# Patient Record
Sex: Male | Born: 1950 | Hispanic: No | Smoking: Former smoker
Health system: Southern US, Community
[De-identification: ages and names within clinical notes are randomized; demographics above are authoritative.]

## PROBLEM LIST (undated history)

## (undated) DIAGNOSIS — I160 Hypertensive urgency: Secondary | ICD-10-CM

## (undated) DIAGNOSIS — I2699 Other pulmonary embolism without acute cor pulmonale: Secondary | ICD-10-CM

## (undated) DIAGNOSIS — E559 Vitamin D deficiency, unspecified: Secondary | ICD-10-CM

## (undated) DIAGNOSIS — K219 Gastro-esophageal reflux disease without esophagitis: Secondary | ICD-10-CM

## (undated) DIAGNOSIS — N186 End stage renal disease: Secondary | ICD-10-CM

## (undated) DIAGNOSIS — I739 Peripheral vascular disease, unspecified: Secondary | ICD-10-CM

## (undated) DIAGNOSIS — R7989 Other specified abnormal findings of blood chemistry: Secondary | ICD-10-CM

## (undated) DIAGNOSIS — I10 Essential (primary) hypertension: Secondary | ICD-10-CM

## (undated) DIAGNOSIS — N2581 Secondary hyperparathyroidism of renal origin: Secondary | ICD-10-CM

## (undated) DIAGNOSIS — I2510 Atherosclerotic heart disease of native coronary artery without angina pectoris: Secondary | ICD-10-CM

## (undated) DIAGNOSIS — I4891 Unspecified atrial fibrillation: Secondary | ICD-10-CM

## (undated) DIAGNOSIS — R162 Hepatomegaly with splenomegaly, not elsewhere classified: Secondary | ICD-10-CM

## (undated) DIAGNOSIS — I499 Cardiac arrhythmia, unspecified: Secondary | ICD-10-CM

## (undated) DIAGNOSIS — G4730 Sleep apnea, unspecified: Secondary | ICD-10-CM

## (undated) DIAGNOSIS — Z8719 Personal history of other diseases of the digestive system: Secondary | ICD-10-CM

## (undated) DIAGNOSIS — I4892 Unspecified atrial flutter: Secondary | ICD-10-CM

## (undated) DIAGNOSIS — Z992 Dependence on renal dialysis: Secondary | ICD-10-CM

## (undated) DIAGNOSIS — G609 Hereditary and idiopathic neuropathy, unspecified: Secondary | ICD-10-CM

## (undated) DIAGNOSIS — K5790 Diverticulosis of intestine, part unspecified, without perforation or abscess without bleeding: Secondary | ICD-10-CM

## (undated) DIAGNOSIS — Z9889 Other specified postprocedural states: Secondary | ICD-10-CM

## (undated) DIAGNOSIS — I770 Arteriovenous fistula, acquired: Secondary | ICD-10-CM

## (undated) DIAGNOSIS — R55 Syncope and collapse: Secondary | ICD-10-CM

## (undated) DIAGNOSIS — I219 Acute myocardial infarction, unspecified: Secondary | ICD-10-CM

## (undated) DIAGNOSIS — R0602 Shortness of breath: Secondary | ICD-10-CM

## (undated) DIAGNOSIS — K625 Hemorrhage of anus and rectum: Secondary | ICD-10-CM

## (undated) DIAGNOSIS — I509 Heart failure, unspecified: Secondary | ICD-10-CM

## (undated) DIAGNOSIS — D649 Anemia, unspecified: Secondary | ICD-10-CM

## (undated) DIAGNOSIS — Z8739 Personal history of other diseases of the musculoskeletal system and connective tissue: Secondary | ICD-10-CM

## (undated) DIAGNOSIS — I5023 Acute on chronic systolic (congestive) heart failure: Secondary | ICD-10-CM

## (undated) DIAGNOSIS — N289 Disorder of kidney and ureter, unspecified: Secondary | ICD-10-CM

## (undated) HISTORY — DX: Hypertensive urgency: I16.0

## (undated) HISTORY — DX: Anemia, unspecified: D64.9

## (undated) HISTORY — DX: Vitamin D deficiency, unspecified: E55.9

## (undated) HISTORY — DX: Hereditary and idiopathic neuropathy, unspecified: G60.9

## (undated) HISTORY — DX: Other pulmonary embolism without acute cor pulmonale: I26.99

## (undated) HISTORY — PX: AV FISTULA PLACEMENT: SHX1204

## (undated) HISTORY — DX: Unspecified atrial fibrillation: I48.91

## (undated) HISTORY — DX: Diverticulosis of intestine, part unspecified, without perforation or abscess without bleeding: K57.90

## (undated) HISTORY — DX: Arteriovenous fistula, acquired: I77.0

## (undated) HISTORY — DX: Syncope and collapse: R55

## (undated) HISTORY — DX: Secondary hyperparathyroidism of renal origin: N25.81

## (undated) HISTORY — PX: KNEE ARTHROSCOPY: SHX127

---

## 2003-05-19 ENCOUNTER — Emergency Department (HOSPITAL_COMMUNITY): Admission: EM | Admit: 2003-05-19 | Discharge: 2003-05-20 | Payer: Self-pay | Admitting: Emergency Medicine

## 2003-05-20 ENCOUNTER — Encounter: Payer: Self-pay | Admitting: Emergency Medicine

## 2005-07-10 ENCOUNTER — Inpatient Hospital Stay (HOSPITAL_COMMUNITY): Admission: EM | Admit: 2005-07-10 | Discharge: 2005-07-16 | Payer: Self-pay | Admitting: Emergency Medicine

## 2005-07-14 ENCOUNTER — Encounter (INDEPENDENT_AMBULATORY_CARE_PROVIDER_SITE_OTHER): Payer: Self-pay | Admitting: Cardiology

## 2005-09-22 ENCOUNTER — Ambulatory Visit: Payer: Self-pay | Admitting: Oncology

## 2005-10-06 ENCOUNTER — Ambulatory Visit: Payer: Self-pay | Admitting: Gastroenterology

## 2005-10-20 ENCOUNTER — Ambulatory Visit: Payer: Self-pay | Admitting: Gastroenterology

## 2005-10-29 HISTORY — PX: COLONOSCOPY: SHX174

## 2005-11-24 ENCOUNTER — Ambulatory Visit: Payer: Self-pay | Admitting: Oncology

## 2006-01-14 ENCOUNTER — Ambulatory Visit: Payer: Self-pay | Admitting: *Deleted

## 2006-01-22 ENCOUNTER — Ambulatory Visit: Payer: Self-pay | Admitting: Oncology

## 2006-11-30 ENCOUNTER — Ambulatory Visit: Payer: Self-pay | Admitting: Oncology

## 2007-02-11 ENCOUNTER — Ambulatory Visit: Payer: Self-pay | Admitting: Vascular Surgery

## 2007-02-11 ENCOUNTER — Emergency Department (HOSPITAL_COMMUNITY): Admission: EM | Admit: 2007-02-11 | Discharge: 2007-02-11 | Payer: Self-pay | Admitting: Emergency Medicine

## 2007-02-11 ENCOUNTER — Encounter (INDEPENDENT_AMBULATORY_CARE_PROVIDER_SITE_OTHER): Payer: Self-pay | Admitting: Emergency Medicine

## 2007-12-11 ENCOUNTER — Inpatient Hospital Stay (HOSPITAL_COMMUNITY): Admission: EM | Admit: 2007-12-11 | Discharge: 2007-12-15 | Payer: Self-pay | Admitting: Emergency Medicine

## 2007-12-11 ENCOUNTER — Ambulatory Visit: Payer: Self-pay | Admitting: Infectious Diseases

## 2007-12-13 ENCOUNTER — Ambulatory Visit: Payer: Self-pay | Admitting: Vascular Surgery

## 2007-12-13 ENCOUNTER — Encounter: Payer: Self-pay | Admitting: Infectious Diseases

## 2007-12-22 ENCOUNTER — Ambulatory Visit: Payer: Self-pay | Admitting: Internal Medicine

## 2007-12-22 ENCOUNTER — Encounter (INDEPENDENT_AMBULATORY_CARE_PROVIDER_SITE_OTHER): Payer: Self-pay | Admitting: Internal Medicine

## 2007-12-22 DIAGNOSIS — N189 Chronic kidney disease, unspecified: Secondary | ICD-10-CM

## 2007-12-22 DIAGNOSIS — D509 Iron deficiency anemia, unspecified: Secondary | ICD-10-CM | POA: Insufficient documentation

## 2007-12-22 DIAGNOSIS — R809 Proteinuria, unspecified: Secondary | ICD-10-CM | POA: Insufficient documentation

## 2007-12-22 DIAGNOSIS — N2581 Secondary hyperparathyroidism of renal origin: Secondary | ICD-10-CM | POA: Insufficient documentation

## 2007-12-22 DIAGNOSIS — I10 Essential (primary) hypertension: Secondary | ICD-10-CM

## 2007-12-22 DIAGNOSIS — E569 Vitamin deficiency, unspecified: Secondary | ICD-10-CM | POA: Insufficient documentation

## 2007-12-22 LAB — CONVERTED CEMR LAB
Albumin: 2.9 g/dL — ABNORMAL LOW (ref 3.5–5.2)
BUN: 37 mg/dL — ABNORMAL HIGH (ref 6–23)
CO2: 24 meq/L (ref 19–32)
Calcium: 8.9 mg/dL (ref 8.4–10.5)
Chloride: 103 meq/L (ref 96–112)
Creatinine, Ser: 4.03 mg/dL — ABNORMAL HIGH (ref 0.40–1.50)
Glucose, Bld: 85 mg/dL (ref 70–99)
Magnesium: 2.5 mg/dL (ref 1.5–2.5)
Phosphorus: 3.9 mg/dL (ref 2.3–4.6)
Potassium: 3.9 meq/L (ref 3.5–5.3)
Sodium: 135 meq/L (ref 135–145)

## 2008-02-09 ENCOUNTER — Ambulatory Visit: Payer: Self-pay | Admitting: Vascular Surgery

## 2008-02-23 ENCOUNTER — Ambulatory Visit: Payer: Self-pay | Admitting: *Deleted

## 2008-02-23 ENCOUNTER — Encounter: Payer: Self-pay | Admitting: Internal Medicine

## 2008-02-23 DIAGNOSIS — IMO0001 Reserved for inherently not codable concepts without codable children: Secondary | ICD-10-CM | POA: Insufficient documentation

## 2008-02-24 ENCOUNTER — Telehealth (INDEPENDENT_AMBULATORY_CARE_PROVIDER_SITE_OTHER): Payer: Self-pay | Admitting: Internal Medicine

## 2008-02-24 DIAGNOSIS — N179 Acute kidney failure, unspecified: Secondary | ICD-10-CM | POA: Insufficient documentation

## 2008-02-24 LAB — CONVERTED CEMR LAB
Albumin: 4.2 g/dL (ref 3.5–5.2)
BUN: 73 mg/dL — ABNORMAL HIGH (ref 6–23)
CO2: 21 meq/L (ref 19–32)
Calcium: 13.6 mg/dL (ref 8.4–10.5)
Chloride: 96 meq/L (ref 96–112)
Creatinine, Ser: 8.43 mg/dL — ABNORMAL HIGH (ref 0.40–1.50)
Glucose, Bld: 120 mg/dL — ABNORMAL HIGH (ref 70–99)
Phosphorus: 5.9 mg/dL — ABNORMAL HIGH (ref 2.3–4.6)
Potassium: 5.1 meq/L (ref 3.5–5.3)
Sed Rate: 37 mm/hr — ABNORMAL HIGH (ref 0–16)
Sodium: 132 meq/L — ABNORMAL LOW (ref 135–145)
Total CK: 206 units/L (ref 7–232)

## 2008-03-07 ENCOUNTER — Ambulatory Visit: Payer: Self-pay | Admitting: Internal Medicine

## 2008-03-07 ENCOUNTER — Encounter: Payer: Self-pay | Admitting: Internal Medicine

## 2008-03-07 DIAGNOSIS — J309 Allergic rhinitis, unspecified: Secondary | ICD-10-CM | POA: Insufficient documentation

## 2008-03-08 LAB — CONVERTED CEMR LAB
Albumin: 3.9 g/dL (ref 3.5–5.2)
BUN: 63 mg/dL — ABNORMAL HIGH (ref 6–23)
CO2: 17 meq/L — ABNORMAL LOW (ref 19–32)
Calcium: 8.6 mg/dL (ref 8.4–10.5)
Chloride: 106 meq/L (ref 96–112)
Creatinine, Ser: 6.77 mg/dL — ABNORMAL HIGH (ref 0.40–1.50)
Glucose, Bld: 97 mg/dL (ref 70–99)
Phosphorus: 4 mg/dL (ref 2.3–4.6)
Potassium: 3.9 meq/L (ref 3.5–5.3)
Sodium: 137 meq/L (ref 135–145)

## 2008-03-22 ENCOUNTER — Encounter (INDEPENDENT_AMBULATORY_CARE_PROVIDER_SITE_OTHER): Payer: Self-pay | Admitting: Internal Medicine

## 2008-03-22 ENCOUNTER — Ambulatory Visit: Payer: Self-pay | Admitting: Infectious Diseases

## 2008-03-22 LAB — CONVERTED CEMR LAB
Albumin: 4.3 g/dL (ref 3.5–5.2)
BUN: 49 mg/dL — ABNORMAL HIGH (ref 6–23)
CO2: 19 meq/L (ref 19–32)
Calcium: 8.3 mg/dL — ABNORMAL LOW (ref 8.4–10.5)
Chloride: 105 meq/L (ref 96–112)
Creatinine, Ser: 4.52 mg/dL — ABNORMAL HIGH (ref 0.40–1.50)
Glucose, Bld: 74 mg/dL (ref 70–99)
Magnesium: 2 mg/dL (ref 1.5–2.5)
Phosphorus: 3.6 mg/dL (ref 2.3–4.6)
Potassium: 4.2 meq/L (ref 3.5–5.3)
Sodium: 138 meq/L (ref 135–145)

## 2008-03-31 ENCOUNTER — Ambulatory Visit: Payer: Self-pay | Admitting: Vascular Surgery

## 2008-04-20 ENCOUNTER — Telehealth (INDEPENDENT_AMBULATORY_CARE_PROVIDER_SITE_OTHER): Payer: Self-pay | Admitting: Internal Medicine

## 2008-06-07 ENCOUNTER — Ambulatory Visit: Payer: Self-pay | Admitting: Vascular Surgery

## 2008-06-07 ENCOUNTER — Telehealth (INDEPENDENT_AMBULATORY_CARE_PROVIDER_SITE_OTHER): Payer: Self-pay | Admitting: Internal Medicine

## 2008-09-18 ENCOUNTER — Telehealth (INDEPENDENT_AMBULATORY_CARE_PROVIDER_SITE_OTHER): Payer: Self-pay | Admitting: Internal Medicine

## 2008-09-25 ENCOUNTER — Encounter (INDEPENDENT_AMBULATORY_CARE_PROVIDER_SITE_OTHER): Payer: Self-pay | Admitting: Internal Medicine

## 2008-09-25 ENCOUNTER — Encounter: Payer: Self-pay | Admitting: Internal Medicine

## 2008-09-25 ENCOUNTER — Ambulatory Visit: Payer: Self-pay | Admitting: Internal Medicine

## 2008-09-29 LAB — CONVERTED CEMR LAB
ALT: 16 units/L (ref 0–53)
AST: 18 units/L (ref 0–37)
Albumin: 4.2 g/dL (ref 3.5–5.2)
Alkaline Phosphatase: 72 units/L (ref 39–117)
BUN: 61 mg/dL — ABNORMAL HIGH (ref 6–23)
CO2: 18 meq/L — ABNORMAL LOW (ref 19–32)
Calcium: 9.4 mg/dL (ref 8.4–10.5)
Chloride: 105 meq/L (ref 96–112)
Creatinine, Ser: 7.83 mg/dL — ABNORMAL HIGH (ref 0.40–1.50)
Glucose, Bld: 82 mg/dL (ref 70–99)
HCT: 33.7 % — ABNORMAL LOW (ref 39.0–52.0)
Hemoglobin: 11.1 g/dL — ABNORMAL LOW (ref 13.0–17.0)
MCHC: 32.9 g/dL (ref 30.0–36.0)
MCV: 80.8 fL (ref 78.0–100.0)
Platelets: 203 10*3/uL (ref 150–400)
Potassium: 4 meq/L (ref 3.5–5.3)
RBC: 4.17 M/uL — ABNORMAL LOW (ref 4.22–5.81)
RDW: 14.5 % (ref 11.5–15.5)
Sodium: 136 meq/L (ref 135–145)
Total Bilirubin: 0.5 mg/dL (ref 0.3–1.2)
Total Protein: 7.9 g/dL (ref 6.0–8.3)
WBC: 3.2 10*3/uL — ABNORMAL LOW (ref 4.0–10.5)

## 2008-11-08 ENCOUNTER — Ambulatory Visit: Payer: Self-pay | Admitting: Vascular Surgery

## 2008-11-10 ENCOUNTER — Telehealth: Payer: Self-pay | Admitting: Internal Medicine

## 2008-12-04 ENCOUNTER — Ambulatory Visit: Payer: Self-pay | Admitting: Vascular Surgery

## 2008-12-04 ENCOUNTER — Ambulatory Visit (HOSPITAL_COMMUNITY): Admission: RE | Admit: 2008-12-04 | Discharge: 2008-12-04 | Payer: Self-pay | Admitting: Vascular Surgery

## 2009-01-10 ENCOUNTER — Ambulatory Visit: Payer: Self-pay | Admitting: Vascular Surgery

## 2009-01-12 ENCOUNTER — Encounter (INDEPENDENT_AMBULATORY_CARE_PROVIDER_SITE_OTHER): Payer: Self-pay | Admitting: Internal Medicine

## 2009-01-25 ENCOUNTER — Ambulatory Visit: Payer: Self-pay | Admitting: Oncology

## 2009-01-25 LAB — CBC WITH DIFFERENTIAL/PLATELET
Basophils Absolute: 0 10*3/uL (ref 0.0–0.1)
EOS%: 3.4 % (ref 0.0–7.0)
HGB: 11 g/dL — ABNORMAL LOW (ref 13.0–17.1)
LYMPH%: 25.9 % (ref 14.0–49.0)
MCH: 26.3 pg — ABNORMAL LOW (ref 27.2–33.4)
MCV: 78.2 fL — ABNORMAL LOW (ref 79.3–98.0)
MONO%: 18.6 % — ABNORMAL HIGH (ref 0.0–14.0)
Platelets: 154 10*3/uL (ref 140–400)
RDW: 14 % (ref 11.0–14.6)

## 2009-01-25 LAB — RETICULOCYTES
IRF: 0.25 (ref 0.070–0.380)
RBC: 4.08 10*6/uL — ABNORMAL LOW (ref 4.20–5.82)
Retic %: 0.7 % (ref 0.7–2.3)

## 2009-01-28 LAB — COMPREHENSIVE METABOLIC PANEL
ALT: 16 U/L (ref 0–53)
Albumin: 4.1 g/dL (ref 3.5–5.2)
CO2: 19 mEq/L (ref 19–32)
Calcium: 9.2 mg/dL (ref 8.4–10.5)
Chloride: 105 mEq/L (ref 96–112)
Sodium: 137 mEq/L (ref 135–145)
Total Protein: 7.4 g/dL (ref 6.0–8.3)

## 2009-01-28 LAB — LACTATE DEHYDROGENASE: LDH: 194 U/L (ref 94–250)

## 2009-02-21 ENCOUNTER — Ambulatory Visit: Payer: Self-pay | Admitting: Internal Medicine

## 2009-02-21 ENCOUNTER — Encounter (INDEPENDENT_AMBULATORY_CARE_PROVIDER_SITE_OTHER): Payer: Self-pay | Admitting: Internal Medicine

## 2009-02-21 LAB — CONVERTED CEMR LAB
BUN: 38 mg/dL — ABNORMAL HIGH (ref 6–23)
CO2: 21 meq/L (ref 19–32)
Calcium: 8.7 mg/dL (ref 8.4–10.5)
Chloride: 103 meq/L (ref 96–112)
Creatinine, Ser: 8.7 mg/dL — ABNORMAL HIGH (ref 0.40–1.50)
GFR calc Af Amer: 8 mL/min — ABNORMAL LOW (ref >60)
GFR calc non Af Amer: 6 mL/min — ABNORMAL LOW (ref >60)
Glucose, Bld: 86 mg/dL (ref 70–99)
HCT: 36 % — ABNORMAL LOW (ref 39.0–52.0)
Hemoglobin: 11.5 g/dL — ABNORMAL LOW (ref 13.0–17.0)
MCHC: 31.9 g/dL (ref 30.0–36.0)
MCV: 80.9 fL (ref 78.0–100.0)
Platelets: 181 10*3/uL (ref 150–400)
Potassium: 4.3 meq/L (ref 3.5–5.3)
RBC: 4.45 M/uL (ref 4.22–5.81)
RDW: 14.4 % (ref 11.5–15.5)
Sodium: 138 meq/L (ref 135–145)
WBC: 2.4 10*3/uL — ABNORMAL LOW (ref 4.0–10.5)

## 2009-02-28 ENCOUNTER — Ambulatory Visit (HOSPITAL_COMMUNITY): Admission: RE | Admit: 2009-02-28 | Discharge: 2009-02-28 | Payer: Self-pay | Admitting: Internal Medicine

## 2009-02-28 ENCOUNTER — Encounter: Payer: Self-pay | Admitting: Internal Medicine

## 2009-02-28 ENCOUNTER — Ambulatory Visit: Payer: Self-pay | Admitting: Cardiology

## 2009-04-20 ENCOUNTER — Ambulatory Visit: Payer: Self-pay | Admitting: Oncology

## 2009-04-24 LAB — CBC WITH DIFFERENTIAL/PLATELET
BASO%: 1 % (ref 0.0–2.0)
EOS%: 3.7 % (ref 0.0–7.0)
MCHC: 33.1 g/dL (ref 32.0–36.0)
MONO#: 0.5 10*3/uL (ref 0.1–0.9)
RBC: 4.02 10*6/uL — ABNORMAL LOW (ref 4.20–5.82)
WBC: 3 10*3/uL — ABNORMAL LOW (ref 4.0–10.3)
lymph#: 0.8 10*3/uL — ABNORMAL LOW (ref 0.9–3.3)

## 2009-04-24 LAB — COMPREHENSIVE METABOLIC PANEL
ALT: 10 U/L (ref 0–53)
AST: 14 U/L (ref 0–37)
Albumin: 4 g/dL (ref 3.5–5.2)
Alkaline Phosphatase: 80 U/L (ref 39–117)
Calcium: 10 mg/dL (ref 8.4–10.5)
Chloride: 103 mEq/L (ref 96–112)
Creatinine, Ser: 7.4 mg/dL — ABNORMAL HIGH (ref 0.40–1.50)
Potassium: 4.4 mEq/L (ref 3.5–5.3)

## 2009-04-24 LAB — LACTATE DEHYDROGENASE: LDH: 159 U/L (ref 94–250)

## 2009-06-07 ENCOUNTER — Emergency Department (HOSPITAL_COMMUNITY): Admission: EM | Admit: 2009-06-07 | Discharge: 2009-06-07 | Payer: Self-pay | Admitting: Emergency Medicine

## 2009-07-04 ENCOUNTER — Telehealth (INDEPENDENT_AMBULATORY_CARE_PROVIDER_SITE_OTHER): Payer: Self-pay | Admitting: Internal Medicine

## 2009-10-23 ENCOUNTER — Ambulatory Visit: Payer: Self-pay | Admitting: Oncology

## 2009-10-25 LAB — CBC WITH DIFFERENTIAL/PLATELET
BASO%: 0.4 % (ref 0.0–2.0)
Basophils Absolute: 0 10*3/uL (ref 0.0–0.1)
EOS%: 5.6 % (ref 0.0–7.0)
Eosinophils Absolute: 0.2 10*3/uL (ref 0.0–0.5)
HCT: 30.4 % — ABNORMAL LOW (ref 38.4–49.9)
HGB: 9.9 g/dL — ABNORMAL LOW (ref 13.0–17.1)
LYMPH%: 25.6 % (ref 14.0–49.0)
MCH: 25.4 pg — ABNORMAL LOW (ref 27.2–33.4)
MCHC: 32.6 g/dL (ref 32.0–36.0)
MCV: 78.1 fL — ABNORMAL LOW (ref 79.3–98.0)
MONO#: 0.5 10*3/uL (ref 0.1–0.9)
MONO%: 17 % — ABNORMAL HIGH (ref 0.0–14.0)
NEUT#: 1.4 10*3/uL — ABNORMAL LOW (ref 1.5–6.5)
NEUT%: 51.4 % (ref 39.0–75.0)
Platelets: 148 10*3/uL (ref 140–400)
RBC: 3.89 10*6/uL — ABNORMAL LOW (ref 4.20–5.82)
RDW: 14.7 % — ABNORMAL HIGH (ref 11.0–14.6)
WBC: 2.7 10*3/uL — ABNORMAL LOW (ref 4.0–10.3)
lymph#: 0.7 10*3/uL — ABNORMAL LOW (ref 0.9–3.3)
nRBC: 0 % (ref 0–0)

## 2009-10-25 LAB — COMPREHENSIVE METABOLIC PANEL
ALT: 13 U/L (ref 0–53)
AST: 11 U/L (ref 0–37)
Albumin: 4.1 g/dL (ref 3.5–5.2)
Alkaline Phosphatase: 66 U/L (ref 39–117)
BUN: 54 mg/dL — ABNORMAL HIGH (ref 6–23)
CO2: 18 mEq/L — ABNORMAL LOW (ref 19–32)
Calcium: 9.5 mg/dL (ref 8.4–10.5)
Chloride: 105 mEq/L (ref 96–112)
Creatinine, Ser: 7.56 mg/dL — ABNORMAL HIGH (ref 0.40–1.50)
Glucose, Bld: 91 mg/dL (ref 70–99)
Potassium: 3.7 mEq/L (ref 3.5–5.3)
Sodium: 137 mEq/L (ref 135–145)
Total Bilirubin: 0.4 mg/dL (ref 0.3–1.2)
Total Protein: 7.6 g/dL (ref 6.0–8.3)

## 2009-10-25 LAB — FERRITIN: Ferritin: 125 ng/mL (ref 22–322)

## 2009-10-25 LAB — IRON AND TIBC
%SAT: 16 % — ABNORMAL LOW (ref 20–55)
Iron: 42 ug/dL (ref 42–165)
TIBC: 260 ug/dL (ref 215–435)
UIBC: 218 ug/dL

## 2009-10-25 LAB — LACTATE DEHYDROGENASE: LDH: 143 U/L (ref 94–250)

## 2010-02-05 ENCOUNTER — Ambulatory Visit: Payer: Self-pay | Admitting: Vascular Surgery

## 2010-02-20 ENCOUNTER — Encounter (HOSPITAL_COMMUNITY): Admission: RE | Admit: 2010-02-20 | Discharge: 2010-05-21 | Payer: Self-pay | Admitting: Nephrology

## 2010-03-04 ENCOUNTER — Ambulatory Visit (HOSPITAL_COMMUNITY): Admission: RE | Admit: 2010-03-04 | Discharge: 2010-03-04 | Payer: Self-pay | Admitting: Vascular Surgery

## 2010-03-04 ENCOUNTER — Ambulatory Visit: Payer: Self-pay | Admitting: Vascular Surgery

## 2010-03-19 ENCOUNTER — Encounter: Payer: Self-pay | Admitting: Pulmonary Disease

## 2010-04-05 ENCOUNTER — Inpatient Hospital Stay (HOSPITAL_COMMUNITY): Admission: RE | Admit: 2010-04-05 | Discharge: 2010-04-09 | Payer: Self-pay | Admitting: Vascular Surgery

## 2010-04-05 ENCOUNTER — Ambulatory Visit: Payer: Self-pay | Admitting: Internal Medicine

## 2010-04-05 ENCOUNTER — Ambulatory Visit: Payer: Self-pay | Admitting: Vascular Surgery

## 2010-04-08 ENCOUNTER — Encounter (INDEPENDENT_AMBULATORY_CARE_PROVIDER_SITE_OTHER): Payer: Self-pay | Admitting: Nephrology

## 2010-04-18 ENCOUNTER — Ambulatory Visit: Payer: Self-pay | Admitting: Vascular Surgery

## 2010-04-23 ENCOUNTER — Ambulatory Visit: Payer: Self-pay | Admitting: Oncology

## 2010-05-02 ENCOUNTER — Ambulatory Visit: Payer: Self-pay | Admitting: Pulmonary Disease

## 2010-05-02 DIAGNOSIS — G4733 Obstructive sleep apnea (adult) (pediatric): Secondary | ICD-10-CM | POA: Insufficient documentation

## 2010-05-24 ENCOUNTER — Ambulatory Visit: Payer: Self-pay | Admitting: Oncology

## 2010-07-02 ENCOUNTER — Ambulatory Visit: Payer: Self-pay | Admitting: Vascular Surgery

## 2010-07-02 ENCOUNTER — Ambulatory Visit (HOSPITAL_COMMUNITY)
Admission: RE | Admit: 2010-07-02 | Discharge: 2010-07-02 | Payer: Self-pay | Source: Home / Self Care | Admitting: Vascular Surgery

## 2010-08-01 ENCOUNTER — Encounter: Payer: Self-pay | Admitting: Pulmonary Disease

## 2010-08-06 ENCOUNTER — Telehealth (INDEPENDENT_AMBULATORY_CARE_PROVIDER_SITE_OTHER): Payer: Self-pay | Admitting: *Deleted

## 2010-08-22 ENCOUNTER — Ambulatory Visit: Payer: Self-pay | Admitting: Pulmonary Disease

## 2010-09-13 ENCOUNTER — Encounter: Payer: Self-pay | Admitting: Pulmonary Disease

## 2010-10-08 NOTE — Assessment & Plan Note (Addendum)
Summary: consult for management of osa   Visit Type:  Initial Consult Copy to:  Dr. Primitivo Gauze Primary Anastacio Bua/Referring Roxine Whittinghill:  Jason Coop MD  CC:  Sleep consult.  Epworth score is 12.Trevor [name]  History of Present Illness: The pt is a 60y/o male who I have been asked to see for osa.  He has had a recent sleep study last month that showed severe osa, with AHI of 52/hr and desat to 87%.  He has been noted to have loud snoring during sleep according to his bed partner, as well as an abnormal breathing pattern.  He describes an occasional choking arousal.  He typically goes to bed btw 12am-2am, and arises at 6-9am to start his day.  He has frequent awakenings during the night, and is not rested upon arising.  He describes intermittant sleep pressure during the day with inactivity, and does feel this interferes with his alertness and concentration.  He has occasional issues with dozing with tv and movies.  He denies any issues with sleepiness while driving.  His epworth score today is abnormal at 12.  He tells me that his weight is down 50 pounds over the last 2 years.   Preventive Screening-Counseling & Management  Alcohol-Tobacco     Alcohol drinks/day: 0     Smoking Status: quit     Packs/Day: 0.25     Year Started: 1979     Year Quit: 1980  Current Medications (verified): 1)  Amlodipine Besylate 10 Mg  Tabs (Amlodipine Besylate) .... Take 1 Pill By Mouth Daily. 2)  Anacin 81 Mg  Tbec (Aspirin) .... Take 1 Pill By Mouth Daily. 3)  Clonidine Hcl 0.1 Mg Tabs (Clonidine Hcl) .Trevor [name].. 1 By Mouth At Bedtime 4)  Hydralazine Hcl 50 Mg  Tabs (Hydralazine Hcl) .Trevor [name].. 1 By Mouth Two Times A Day 5)  Trandate 200 Mg  Tabs (Labetalol Hcl) .Trevor [name].. 1 By Mouth Two Times A Day 6)  Rena-Vite  Tabs (B Complex-C-Folic Acid) .Trevor [name].. 1 By Mouth Daily 7)  Renagel 800 Mg Tabs (Sevelamer Hcl) .... 3 By Mouth With Meals  Allergies (verified): 1)  ! * Percocet  Past History:  Past Medical History: Reviewed  history from 02/23/2008 and no changes required. CRF stage 4, with h/o nephrotic range protienuria, 6gm/24hr  (seen by Dr. Hyman Hopes 5/09) Left AVF by Dr. Darrick Penna 12/14/07 Secondary hyperparathyroidism Anemia (Fe def +/- AOCD, ferretin only 25 in setting of stage 4 CRF) hypovitaminosis D h/o hypertensive urgency  Past Surgical History: Reviewed history from 02/23/2008 and no changes required. Left AVF by Dr. Darrick Penna 12/14/07  Family History: Reviewed history and no changes required. Family History Hypertension---father Renal failure---father Seasonal allergies---father  Social History: Reviewed history from 02/23/2008 and no changes required. Former Smoker (quit over 30 years ago) Alcohol use-yes (occasional glass of wine) Drug use-no Patient states former smoker.  DisabilityAlcohol drinks/day:  0 Packs/Day:  0.25  Review of Systems       The patient complains of shortness of breath with activity, non-productive cough, chest pain, and weight change.  The patient denies shortness of breath at rest, productive cough, coughing up blood, irregular heartbeats, acid heartburn, indigestion, loss of appetite, abdominal pain, difficulty swallowing, sore throat, tooth/dental problems, headaches, nasal congestion/difficulty breathing through nose, sneezing, itching, ear ache, anxiety, depression, hand/feet swelling, joint stiffness or pain, rash, change in color of mucus, and fever.    Vital Signs:  Patient profile:   60 year old male Height:      71.5  inches (181.61 cm) Weight:      236 pounds (107.27 kg) BMI:     32.57 O2 Sat:      99 % on Room air Temp:     98.2 degrees F (36.78 degrees C) oral Pulse rate:   76 / minute BP sitting:   140 / 82  (left arm) Cuff size:   large  O2 Sat at Rest %:  99 O2 Flow:  Room air CC: Sleep consult.  Epworth score is 12. Comments Medications reviewed with the patient. Daytime phone verified. Michel Bickers CMA  May 02, 2010 10:49 AM   Physical  Exam  General:  60 male in nad Eyes:  PERRLA and EOMI.   Nose:  mild turbinate hypertrophy, mild septal deviation to left. Mouth:  moderate elongation of soft palate and uvula Neck:  no jvd, tmg, LN Lungs:  faint basilar crackles, no wheezing or rhonchi Heart:  rrr, no mrg Abdomen:  soft and nontender, bs+ Extremities:  no significant edema, pulses intact distally mild varicosities no cyanosis  Neurologic:  alert and oriented, moves all 4.   Impression & Recommendations:  Problem # 1:  OBSTRUCTIVE SLEEP APNEA (ICD-327.23) The pt has severe osa by his recent sleep study, and not only has symptoms during the day, but also comorbid medical conditions that can be influenced by sleep disordered breathing.  I have had a long discussion with the pt about sleep apnea, including its impact on QOL and CV health.  His best treatment option at this time is cpap while working on weight loss.  He is agreeable to trying this.  I will set the patient up on cpap at a moderate pressure level to allow for desensitization, and will troubleshoot the device over the next 4-6weeks if needed.  The pt is to call me if having issues with tolerance.  Will then optimize the pressure once patient is able to wear cpap on a consistent basis.  Medications Added to Medication List This Visit: 1)  Clonidine Hcl 0.1 Mg Tabs (Clonidine hcl) .Trevor [name].. 1 by mouth at bedtime 2)  Hydralazine Hcl 50 Mg Tabs (Hydralazine hcl) .Trevor [name].. 1 by mouth two times a day 3)  Trandate 200 Mg Tabs (Labetalol hcl) .Trevor [name].. 1 by mouth two times a day 4)  Rena-vite Tabs (B complex-c-folic acid) .Trevor [name].. 1 by mouth daily 5)  Renagel 800 Mg Tabs (Sevelamer hcl) .... 3 by mouth with meals  Other Orders: Consultation Level IV (13086) DME Referral (DME)  Patient Instructions: 1)  will start on cpap.  Please call if issues with tolerance 2)  work on weight loss 3)  followup with me in 5 weeks.  Appended Document: consult for management of osa download shows  poor compliance, with only 12/30 days worn 4hrs or more.  needs ov..never came for 5 week f/u visit.  Appended Document: consult for management of osa pt coming in on 11/15 at 10:15

## 2010-10-08 NOTE — Letter (Signed)
Summary: CMN for CPAP Supplies/[facility]  CMN for CPAP Supplies/[facility]   Imported By: Sherian Rein 08/09/2010 09:28:00  _____________________________________________________________________  External Attachment:    Type:   Image     Comment:   External Document

## 2010-10-08 NOTE — Progress Notes (Signed)
Summary: office visit scheduled  Phone Note Outgoing Call   Call placed by: Kandice Hams CMA,  August 06, 2010 12:24 PM Call placed to: Patient Action Taken: Appt scheduled Summary of Call: pt informed per Dr Shelle Iron needs office visit from 05/02/10 followup on osa.  Ov scheduled 08/22/10 .Kandice Hams [facility]  August 06, 2010 12:26 PM  Initial call taken by: Kandice Hams CMA,  August 06, 2010 12:26 PM

## 2010-10-10 NOTE — Letter (Signed)
Summary: Statement of Medical Necessity / [facility]  Statement of Medical Necessity / [facility]   Imported By: Lennie Odor 09/20/2010 09:33:19  _____________________________________________________________________  External Attachment:    Type:   Image     Comment:   External Document

## 2010-10-10 NOTE — Letter (Signed)
Summary: CMN & Prior Approval for DME  / [facility]  CMN & Prior Approval for DME  / [facility]   Imported By: Lennie Odor 09/20/2010 12:20:24  _____________________________________________________________________  External Attachment:    Type:   Image     Comment:   External Document

## 2010-11-22 LAB — RENAL FUNCTION PANEL
Albumin: 3.1 g/dL — ABNORMAL LOW (ref 3.5–5.2)
BUN: 58 mg/dL — ABNORMAL HIGH (ref 6–23)
GFR calc Af Amer: 7 mL/min — ABNORMAL LOW (ref >60)
GFR calc non Af Amer: 6 mL/min — ABNORMAL LOW (ref >60)
Phosphorus: 4.4 mg/dL (ref 2.3–4.6)
Potassium: 3.6 mEq/L (ref 3.5–5.1)
Sodium: 134 mEq/L — ABNORMAL LOW (ref 135–145)

## 2010-11-22 LAB — CBC
HCT: 28.2 % — ABNORMAL LOW (ref 39.0–52.0)
Hemoglobin: 9.2 g/dL — ABNORMAL LOW (ref 13.0–17.0)
MCH: 26 pg (ref 26.0–34.0)
MCHC: 32.7 g/dL (ref 30.0–36.0)
MCV: 79.6 fL (ref 78.0–100.0)
Platelets: 134 10*3/uL — ABNORMAL LOW (ref 150–400)
RBC: 3.55 MIL/uL — ABNORMAL LOW (ref 4.22–5.81)
RDW: 16 % — ABNORMAL HIGH (ref 11.5–15.5)
WBC: 3.7 10*3/uL — ABNORMAL LOW (ref 4.0–10.5)

## 2010-11-23 LAB — RENAL FUNCTION PANEL
Albumin: 3.1 g/dL — ABNORMAL LOW (ref 3.5–5.2)
CO2: 25 mEq/L (ref 19–32)
Calcium: 10.5 mg/dL (ref 8.4–10.5)
Calcium: 9.7 mg/dL (ref 8.4–10.5)
Creatinine, Ser: 9.61 mg/dL — ABNORMAL HIGH (ref 0.4–1.5)
GFR calc Af Amer: 7 mL/min — ABNORMAL LOW (ref >60)
GFR calc non Af Amer: 6 mL/min — ABNORMAL LOW (ref >60)
Glucose, Bld: 99 mg/dL (ref 70–99)
Phosphorus: 6 mg/dL — ABNORMAL HIGH (ref 2.3–4.6)
Sodium: 134 mEq/L — ABNORMAL LOW (ref 135–145)

## 2010-11-23 LAB — COMPREHENSIVE METABOLIC PANEL
ALT: 14 U/L (ref 0–53)
AST: 24 U/L (ref 0–37)
Albumin: 3.1 g/dL — ABNORMAL LOW (ref 3.5–5.2)
CO2: 24 mEq/L (ref 19–32)
Calcium: 9.1 mg/dL (ref 8.4–10.5)
Calcium: 9.9 mg/dL (ref 8.4–10.5)
Creatinine, Ser: 10.22 mg/dL — ABNORMAL HIGH (ref 0.4–1.5)
GFR calc Af Amer: 8 mL/min — ABNORMAL LOW (ref >60)
GFR calc non Af Amer: 5 mL/min — ABNORMAL LOW (ref >60)
Glucose, Bld: 84 mg/dL (ref 70–99)
Sodium: 137 mEq/L (ref 135–145)
Total Protein: 6.4 g/dL (ref 6.0–8.3)

## 2010-11-23 LAB — HEPATITIS B CORE ANTIBODY, TOTAL: Hep B Core Total Ab: NEGATIVE

## 2010-11-23 LAB — CBC
HCT: 28.8 % — ABNORMAL LOW (ref 39.0–52.0)
Hemoglobin: 9.1 g/dL — ABNORMAL LOW (ref 13.0–17.0)
Hemoglobin: 9.4 g/dL — ABNORMAL LOW (ref 13.0–17.0)
Hemoglobin: 9.6 g/dL — ABNORMAL LOW (ref 13.0–17.0)
MCH: 25.9 pg — ABNORMAL LOW (ref 26.0–34.0)
MCH: 26.2 pg (ref 26.0–34.0)
MCHC: 32.8 g/dL (ref 30.0–36.0)
MCHC: 33 g/dL (ref 30.0–36.0)
MCHC: 33.3 g/dL (ref 30.0–36.0)
Platelets: 117 10*3/uL — ABNORMAL LOW (ref 150–400)
Platelets: 134 10*3/uL — ABNORMAL LOW (ref 150–400)
RDW: 15.7 % — ABNORMAL HIGH (ref 11.5–15.5)

## 2010-11-23 LAB — FERRITIN: Ferritin: 144 ng/mL (ref 22–322)

## 2010-11-23 LAB — POCT I-STAT, CHEM 8
Calcium, Ion: 1.32 mmol/L (ref 1.12–1.32)
Chloride: 107 mEq/L (ref 96–112)
Creatinine, Ser: 11.1 mg/dL — ABNORMAL HIGH (ref 0.4–1.5)
Glucose, Bld: 89 mg/dL (ref 70–99)
Hemoglobin: 11.2 g/dL — ABNORMAL LOW (ref 13.0–17.0)
Potassium: 3.8 mEq/L (ref 3.5–5.1)

## 2010-11-23 LAB — POCT HEMOGLOBIN-HEMACUE: Hemoglobin: 9.3 g/dL — ABNORMAL LOW (ref 13.0–17.0)

## 2010-11-23 LAB — CARDIAC PANEL(CRET KIN+CKTOT+MB+TROPI)
CK, MB: 4.9 ng/mL — ABNORMAL HIGH (ref 0.3–4.0)
CK, MB: 5.5 ng/mL — ABNORMAL HIGH (ref 0.3–4.0)
Relative Index: 1.2 (ref 0.0–2.5)
Relative Index: 1.3 (ref 0.0–2.5)
Relative Index: 1.3 (ref 0.0–2.5)
Total CK: 404 U/L — ABNORMAL HIGH (ref 7–232)
Total CK: 410 U/L — ABNORMAL HIGH (ref 7–232)
Troponin I: 0.15 ng/mL — ABNORMAL HIGH (ref 0.00–0.06)

## 2010-11-23 LAB — RETICULOCYTES
RBC.: 3.69 MIL/uL — ABNORMAL LOW (ref 4.22–5.81)
Retic Ct Pct: 1 % (ref 0.4–3.1)

## 2010-11-23 LAB — IRON AND TIBC: UIBC: 229 ug/dL

## 2010-11-23 LAB — HEPATITIS B SURFACE ANTIBODY,QUALITATIVE: Hep B S Ab: NEGATIVE

## 2010-11-23 LAB — HEPATITIS B SURFACE ANTIGEN: Hepatitis B Surface Ag: NEGATIVE

## 2010-11-23 LAB — PHOSPHORUS: Phosphorus: 5.5 mg/dL — ABNORMAL HIGH (ref 2.3–4.6)

## 2010-11-24 LAB — BASIC METABOLIC PANEL
BUN: 94 mg/dL — ABNORMAL HIGH (ref 6–23)
CO2: 21 mEq/L (ref 19–32)
Chloride: 109 mEq/L (ref 96–112)
Creatinine, Ser: 11.09 mg/dL — ABNORMAL HIGH (ref 0.4–1.5)
GFR calc Af Amer: 6 mL/min — ABNORMAL LOW (ref >60)

## 2010-11-24 LAB — CBC
MCH: 26.5 pg (ref 26.0–34.0)
MCV: 80.8 fL (ref 78.0–100.0)
Platelets: 169 10*3/uL (ref 150–400)
RBC: 3.48 MIL/uL — ABNORMAL LOW (ref 4.22–5.81)

## 2010-11-24 LAB — IRON AND TIBC
Iron: 39 ug/dL — ABNORMAL LOW (ref 42–135)
Saturation Ratios: 15 % — ABNORMAL LOW (ref 20–55)
TIBC: 257 ug/dL (ref 215–435)

## 2010-11-24 LAB — FERRITIN: Ferritin: 137 ng/mL (ref 22–322)

## 2010-11-24 LAB — POCT HEMOGLOBIN-HEMACUE: Hemoglobin: 9.3 g/dL — ABNORMAL LOW (ref 13.0–17.0)

## 2010-11-25 ENCOUNTER — Encounter: Payer: Self-pay | Admitting: Pulmonary Disease

## 2010-12-05 NOTE — Miscellaneous (Signed)
Summary: NO CMN's until he comes for ov.   Clinical Lists Changes  never came for f/u.  no signing of further cmn's period until he comes for ov.

## 2010-12-19 LAB — POCT I-STAT 4, (NA,K, GLUC, HGB,HCT)
HCT: 40 % (ref 39.0–52.0)
Hemoglobin: 13.6 g/dL (ref 13.0–17.0)
Sodium: 137 mEq/L (ref 135–145)

## 2011-01-21 NOTE — Discharge Summary (Signed)
NAMEDEMARQUS, JOCSON                ACCOUNT NO.:  [id number]   MEDICAL RECORD NO.:  [id number]          PATIENT TYPE:  INP   LOCATION:  4736                         FACILITY:  [facility]   PHYSICIAN:  Fransisco Hertz, M.D.  DATE OF BIRTH:  03/10/1950   DATE OF ADMISSION:  12/11/2007  DATE OF DISCHARGE:  12/15/2007                               DISCHARGE SUMMARY   DISCHARGE DIAGNOSES:  1. Renal insufficiency with baseline creatinine between 4 to 5.  2. Secondary parahypothyroidism and hypovitaminosis D, possibly      secondary to renal insufficiency.  3. Hypertensive urgency, resolved.  4. Iron deficiency anemia with normal colonoscopy preparation a year      ago.  5. Hypovolemia, resolved.   MEDICATIONS ON DISCHARGE:  1. Amlodipine 10 mg p.o. daily.  2. Aspirin 81 mg p.o. daily.  3. Calcitriol 0.25 mcg p.o. daily.  4. Clonidine 0.2 mg p.o. b.i.d.  5. Lasix 160 mg p.o. b.i.d.  6. Hydralazine 50 mg p.o. t.i.d..  7. Labetalol 400 mg p.o. b.i.d.  8. Serial vitamin 1 tablet daily.  9. Ferrous sulfate 325 mg p.o. b.i.d.  10.Tums 2 tablets p.o. t.i.d. with meals and if upper abdominal      discomfort present take 1 pill on empty stomach.  11.K-Dur 40 mEq p.o. daily.   DISPOSITION AND FOLLOWUP:  [name] will follow up with Dr. Aleene Davidson  and myself on [facility] on December 22, 2007.  He  needs a renal function panel on a follow up visit.  Issues to be  followed up include:  1. Check a renal function panel for creatinine and potassium.  2. Check his blood pressure.  As on the day of discharge, his blood      pressure was systolic running 119 to 157 and diastolic 75 to 115.      His target is less than 140/80.  If blood pressure is still high,      we can go up on his medications including Lasix, which can be      changed to t.i.d. including clonidine as well.  3. Renal insufficiency phase.  Get a follow up with Dr. Margreta Journey office in a month from  now for a possible dialysis and      further care from Nephrology.  4. Please discuss with patient about the access to medications, which      was worked in detail at the time of discharge.   CONSULTATIONS:  1. Maree Krabbe, MD, from Nephrology.  2. Janetta Hora. Fields, MD, from Vascular Surgery.  3. Pharmacology.   PROCEDURES DONE DURING THIS HOSPITALIZATION:  1. Include a 2-D echo done on December 13, 2007, which is positive for      mild dilated left ventricle with overall systolic function 35 to      40.  There is also mild-to-moderate increase of left ventricle wall      thickness.  There is mild aortic valve regurgitation with mild      mitral annular calcification.  There is left atrium,  which is      moderately dilated.  The right ventricle is also mildly dilated      with systolic function mildly reduced.  Estimated peak pulmonary      artery systolic pressure moderately-to-markedly increased, mild-to-      moderate tricuspid regurgitation.  There is also right atrium      dilatations mild-to-moderate.  2. Hemoglobin A1c of 5.7, anemia panel positive for ferritin of 25,      vitamin B12 654, serum iron 31, and RBC full at 973.  3. A lipid profile with TG 95, HDL 27, and LDL 46.  4. Hepatitis B surface antibody negative.  5. Parathyroid hormone level high at 387.  6. ANA negative.  7. 25-hydroxy vitamin D low at 15.   HISTORY OF PRESENT ILLNESS:  [name] is a 60 year old man with a  history of chronic renal insufficiency with baseline creatinine ranging  between 3.2-3.5 on November 2006 and was 0.7 on June 2008 with  hypertension and questionable iron deficiency anemia came to the ER  because of stomach swelling in his own words starting couple of days  prior to admission.  His stomach is swollen as if it is basketball  inside.  No abdominal pain, nausea, vomiting, or diarrhea, but he is not  eating as he feels full.  He has also difficulty in breathing.  This is  not  related to exertion and especially worsened while lying down at  night.  He uses 2 pillows at night.  There is no chest pain.  There is  no fever or chills.  He saw Dr. Hyman Hopes almost 5 year ago when his  creatinine was about 4.4 and the plan was to probably start him on  dialysis in the future; but at that time, he lost his job and insurance  and cannot see him again.  But the patient is also not taking any of his  medications for about a year.  I first noted he is on a 5  antihypertensive medications at high dose.  He also has decreasing  urinary output in the last few days.   PHYSICAL EXAMINATION:  VITAL SIGNS:  Temp 97.6, blood pressure 172/121,  pulse 77, respiration 22, and sating 99% on room air.  GENERAL:  Not in distress.  EYE:  Anicteric. PERRLA.  HEENT:  Dry oropharynx.  NECK:  No neck masses.  JVD increased.  CHEST:  Occasional left basal crackles.  Normal air entry bilaterally.  CARDIOVASCULAR:  His first and second heart sounds normal.  Regular rate  and rhythm.  No rubs.  No gallops.  ABDOMEN:  Mildly swollen, bowel sound normal.  Soft, nontender, and no  organomegaly.  EXTREMITIES:  Pitting edema up to the knees bilaterally.  Homan's and  Martel's signs negative.  Lymph node examination negative for cervical  and supraclavicular lymph node.  NEURO EXAM:  Alert and oriented x4.  PSYCH:  Appropriate.   LABS AT ADMISSION:  WBC 3.2, ANC 1.9, hemoglobin 12.7, MCV 73.9, RDW  18.7, sodium 140, potassium 3.6, chloride 108, bicarbonate 3, BUN 58,  creatinine 5.5, glucose 88, bilirubin 2.6, creatinine 0.2, alk phos 130,  AST 25, ALT 17, hemoglobin 6.2,  albumin 2.7, ionized calcium 1.06, CK-  MB 5.1, troponin I less than 0.05, and BNP 766.  Chest x-ray is stable.  Marked cardiomegaly and pulmonary vascular congestion with a  retrocardiac atelectasis versus pneumonia.   HOSPITAL COURSE:  1. Hypertensive urgency.  Basically, we admitted him  to our telemetry      bed, monitored  for any arrhythmia, which was negative. There were      none.  Cardiac enzymes total of  3, had a mild spill of CKB into      the highest to 7.8 and also mild spill of troponins I in 0.12.  The      patient did not have any chest pain or any worsening shortness of      breath.  Basically, he was treated with his antihypertensive      medication including clonidine and Lasix initially.  A 2-D echo was      done and the findings mentioned as in the hospital procedures.  EKG      was done, which was not significantly changed since his last      admission.  He was also on aspirin as well as labetalol.      Hydralazine was stopped later, and he started to feel better from      the next day, but his blood pressure was still high fluctuating      between 136 to 119 in the systolic and 97 to 132 diastolic.  We      increased the dose of antihypertensive medications including      hydralazine from 10 mg q.6 h. to 50 mg t.i.d.  We also increased      the dose of  labetalol to 400 b.i.d., increased the dose of      clonidine to 0.2 mg b.i.d. as well as increased the dose of Lasix      to 160 mg t.i.d. and then changed  b.i.d.  As on the day of      discharge, blood pressure was acceptable at 126/95.  As I mentioned      earlier, he might need his further increase of his blood pressure      medication dose including clonidine as well as Lasix.  2. Renal insufficiency.  Nephrology consult with Dr. Arlean Hopping who will      follow him daily.  Please note that patient had a proteinuria of      more than 6 g.  Dr. Arlean Hopping did not believe that he will benefit      from renal biopsy at this point or even immunosuppressive therapy.      This new proteinuria can be just because of chronic      glomerulonephritis or just because of hypertensive      urgency/emergency.  Please also note that [name] has a workup      for renal failure during his last admission in 2006 with a negative      protein  electrophoresis, negative for HIV, negative for hepatitis      compliment levels, and negative for ANA.  I will repeat ANA and      hepatitis B surface antigen was done at this time, which was      negative.  We also checked parathyroid hormone level, which was      high at 387, 25-hydroxy vitamin D low at 15, and basically he was      started on calcitriol.  We also checked hemoglobin A1c, which was      normal at 5.7 and we also consulted Dr. Darrick Penna from Vascular      Surgery to get an IV access for AV fistula, which he got on December 14, 2007.  [name] needs to  follow up with Dr. Malena Catholic office      in a month for commencement of dialysis.  This appointment will be      done from the clinic.  3. Iron deficiency anemia.  He has microcystic anemia with ferritin at      25.  Per [name] himself, he had a screening colonoscopy done a      year ago, which was normal.  So he was basically sent home on a      p.o. iron.  Of note, he was given IV iron while in hospital which      he tolerated well.  4. Low WBC.  [name] was actually at his baseline level.  His WBC      while in the hospital ranged from 2-4, which was also present      during his admission in 2006.  5. Hypokalemia secondary Lasix.  This was basically repleted and      magnesium was also checked, which was normal.  Please note that Mr.      Daphine Deutscher needs a primary care, which we can establish  in our clinic.      This is very important in order to control his blood pressure.   VITAL SIGNS ON THE DAY DISCHARGE:  Temp 98, pulse 67, respiration 18,  blood pressure 126/95, and sating 97% on room air.   LABS ON THE DAY OF DISCHARGE:  Sodium 138 and potassium 3.4.  Please  note, he was given potassium pill before he was discharged and also sent  on daily potassium pills.  Chloride of 102, glucose of 114, bicarb 25,  creatinine 4.87, calcium 7.9, albumin 2.5, and phosphorus 3.8.   CONDITION ON DISCHARGE:  Minimal  swelling of his leg, minimal swelling  of his abdomen, and no shortness of breath.      Jason Coop, MD  Electronically Signed      Fransisco Hertz, M.D.  Electronically Signed    YP/MEDQ  D:  12/17/2007  T:  12/18/2007  Job:  [id number]   cc:   Janetta Hora. Darrick Penna, MD  Maree Krabbe, M.D.

## 2011-01-21 NOTE — Procedures (Signed)
CEPHALIC VEIN MAPPING   INDICATION:  End-stage renal disease.   HISTORY:  End-stage renal disease.   EXAM:   The right cephalic vein is compressible.   Diameter measurements range from 0.35 to 0.31 from [facility] fossa to upper  arm.   The left cephalic vein is compressible.   Diameter measurements range from 0.21 to 0.34.   See attached worksheet for all measurements.   IMPRESSION:  Patent bilateral cephalic veins which are marginally of  acceptable diameter for use as a dialysis access site.   ___________________________________________  Janetta Hora. Fields, MD   MG/MEDQ  D:  11/08/2008  T:  11/08/2008  Job:  [id number]

## 2011-01-21 NOTE — Consult Note (Signed)
Trevor [name], Trevor [name]                ACCOUNT NO.:  [id number]   MEDICAL RECORD NO.:  [id number]          PATIENT TYPE:  INP   LOCATION:  4736                         FACILITY:  [facility]   PHYSICIAN:  Maree Krabbe, M.D.DATE OF BIRTH:  03/10/1950   DATE OF CONSULTATION:  DATE OF DISCHARGE:                                 CONSULTATION   REASON FOR CONSULTATION:  Renal failure.   HISTORY:  This is a pleasant 60 year old African-American male with  history of hypertension for several years and known chronic kidney  disease.  He has been lost to followup, due to poor financial situation  and no insurance.  He is has not taken medications for some time, and he  presents with a hypertensive crisis and a creatinine of five (5).  His  creatinine in 2006 was 3.2.  At that time the patient had a serologic  workup for his renal failure which was negative.  He had a negative ANA,  normal SPEP, UPEP, negative hepatitis B and hepatitis C and HIV studies.  He had a ultrasound at that time which showed kidneys about 9-1/2 to 10-  1/2 cm which were echogenic.   The patient was admitted yesterday on April 4 with complaints of  swelling in the legs and abdomen for a few days.  He denied any  abdominal pain, nausea, vomiting.  He was also having dyspnea with  exertion and orthopnea.  He was having to use two pillows at night.  He  last saw Dr. Hyman Hopes at [facility] a year ago.  Creatinine was  apparently 4.4 at that time.  Then he lost his job and insurance and has  not been back to see him since.   ALLERGIES:  NONE.   PAST MEDICAL HISTORY:  1. Hypertensive urgency in November 2006.  2. Chronic kidney disease as above.  3. EF 50% by echo in November 2006, with moderate LV wall thickness.   CURRENT MEDICATIONS:  Aspirin 325 daily, Lasix 125 IV b.i.d.,  hydralazine 10 q.6 h., Normodyne 400 mg b.i.d., Protonix, renal vitamin.   SOCIAL HISTORY:  Former smoker, occasional alcohol.  No drug use.   SOCIAL HISTORY:  Married.  He has 83 and 35-year-old daughters.  The  younger one is home-schooled by his wife.  He lives at home with his  wife and family.   FAMILY HISTORY:  Noncontributory.   REVIEW OF SYSTEMS:  VITAL SIGNS:  Denies fever, chills, positive for  weight gain and fatigue.  ENT:  No sore throat, earache or nosebleed.  CARDIOVASCULAR:  As above.  RESPIRATORY:  As above.  No nonproductive cough, no hemoptysis or  wheezing.  GI:  As above.  Good appetite.  GU:  No dysuria, urinary frequency, flank pain or hematuria.  DERMATOLOGIC:  No hair loss, rash or itching.  ENDOCRINE:  No heat or cold intolerance.  MUSCULOSKELETAL:  No recent trauma.  He does have significant swelling  of the legs.  NEUROLOGIC:  Generalized weakness.  No focal deficits.   PHYSICAL EXAM:  VITAL SIGNS:  Blood pressure 160/100, heart rate 80 and  regular, respirations 16 unlabored.  GENERAL:  This is a moderately-obese African-American male sitting up at  the side of the bed in no distress, is fully oriented.  No asterixis.  SKIN:  Warm and dry without rash.  HEENT:  Unremarkable.  NECK:  Supple with JVP about 12 cm.  CHEST:  Faint rales at the bases, mostly clear.  CARDIAC:  Regular rate and rhythm without rub or gallop.  ABDOMEN:  Soft, nontender, obese, possible ascites.  EXTREMITIES:  3+ pitting edema in the distal lower extremities, 1 to 2+  above the knees.   LABS:  And iron saturation is 9% which is low, cholesterol 92.  BNP is  1426, magnesium 2.4.  Cardiac enzymes are negative.  Renal function:  Potassium 3.3, BUN 52, creatinine 5.0, CO2 24, albumin 2.3, calcium 8.2,  phosphorus 4.5.  Hemoglobin is 11.0, white blood count 3,000, platelets  240.  Urinalysis shows greater than 300 protein and 3-6 red blood cells.   IMPRESSION:  1. Progressive chronic kidney disease, hypertension versus chronic GN      vs  FSGS.  Creatinine has progressed from 3 in 2006 to 4 last year      and 5 now.   Serologic workup was negative in 2006 as above.  He      does not have any uremic signs or symptoms.  2. Hypertension on labetalol and hydralazine, not well-controlled as      of yet.  3. Volume excess with lower extremity edema; diuresing on IV Lasix      with good urine output.  4. Anemia of CKD,  with iron deficiency.  5. Secondary hyperparathyroidism.   RECOMMENDATIONS:  Chronic kidney disease is too advanced for renal  biopsy or immunosuppressive therapy.  The patient needs blood pressure  control and dialysis education, and vein mapping for permanent access  prior to discharge.  Will give IV iron load of 500 mg over a few days.  Agree with diuretics and gradual blood pressure control as you are  doing.  Could add an ACE inhibitor or Norvasc as next BP agent.  We will  add Tums with meals and check intact PTH.  Thank you for the referral.      Maree Krabbe, M.D.  Electronically Signed     RDS/MEDQ  D:  12/12/2007  T:  12/12/2007  Job:  [id number]

## 2011-01-21 NOTE — Op Note (Signed)
[name], [name]                ACCOUNT NO.:  [id number]   MEDICAL RECORD NO.:  [id number]          PATIENT TYPE:  AMB   LOCATION:  SDS                          FACILITY:  [facility]   PHYSICIAN:  Janetta Hora. Fields, MD  DATE OF BIRTH:  03/10/1950   DATE OF PROCEDURE:  DATE OF DISCHARGE:  12/04/2008                               OPERATIVE REPORT   PROCEDURE:  Left brachiocephalic AV fistula.   PREOPERATIVE DIAGNOSIS:  End-stage renal disease.   POSTOPERATIVE DIAGNOSIS:  End-stage renal disease.   ANESTHESIA:  Local with IV sedation.   SURGEON:  Janetta Hora. Fields, MD   ASSISTANT:  Emilio Aspen, RNFA   OPERATIVE FINDINGS:  A 3-mm cephalic vein.   OPERATIVE DETAILS:  After obtaining informed consent, the patient was  taken to the operating room.  The patient was placed in supine position  on the operating table.  After adequate sedation, the patient's entire  left upper extremity was prepped and draped in usual sterile fashion.  Local anesthesia was infiltrated near the antecubital crease in the left  arm.  Transverse incision was made in this location, carried down  through the subcutaneous tissues down the level of the cephalic vein.  Cephalic vein had multiple branches approximately 3 mm in diameter.  This was dissected free circumferentially.  Next, the brachial artery  was dissected free in the medial portion of the incision.  The brachial  artery was dissected free circumferentially and vessel loops were placed  proximal and distal to the planned site of arteriotomy.  The patient was  then given 5000 units of intravenous heparin.  Vessel loops were used to  control the artery proximally and distally.  The distal cephalic vein  was ligated with a 3-0 silk tie and transected.  The vein was then swung  over to the level of the artery.  A longitudinal opening was made in the  artery.  The vein was gently distended and flushed with heparinized  saline.  It was marked for  orientation.  Vein was then sewn end of vein  to side of artery using running 7-0 Prolene suture.  Just prior to  completion of anastomosis, this was forebled, backbled, and thoroughly  flushed.  Anastomosis was secured, clamps were released.  There was a  palpable thrill in the fistula immediately.  Hemostasis was obtained.  Subcutaneous tissues were reapproximated using running 3-0 Vicryl  suture.  Skin was closed with a 4-0 Vicryl subcuticular stitch.  The  patient tolerated the procedure well, and there were no complications.  Instrument, sponge, and needle counts were correct at the end of the  case.  The patient was taken to the recovery room in stable condition.  The patient had a palpable radial pulse at the end of the case.     Janetta Hora. Fields, MD  Electronically Signed    CEF/MEDQ  D:  12/04/2008  T:  12/05/2008  Job:  [id number]

## 2011-01-21 NOTE — Assessment & Plan Note (Signed)
OFFICE VISIT   Trevor [name], Trevor [name] A  DOB:  03/10/1950                                       02/09/2008  CHART#:[id number]   The patient is a 60 year old male who is status post placement of a left  radiocephalic AV fistula on December 14, 2007.  He currently is not on  dialysis.   EXAM:  He has an easily palpable thrill in the fistula.  The vein tends  to run on the volar or medial aspect of the forearm.  There are numerous  side branches up in the forearm but the fistula seems to be maturing and  should be ready for cannulation if he requires dialysis at some point in  the future.  He will follow up on an as-needed basis.   Janetta Hora. Fields, MD  Electronically Signed   CEF/MEDQ  D:  02/09/2008  T:  02/10/2008  Job:  [id number]   cc:   Garnetta Buddy, M.D.

## 2011-01-21 NOTE — Procedures (Signed)
CEPHALIC VEIN MAPPING   INDICATION:  AV fistula placement.   HISTORY:  Kidney failure.   EXAM:  The right cephalic vein is compressible.  The right basilic vein is  compressible.   Diameter measurements range from 0.43 to 0.18 in the cephalic vein and  0.55 to 0.31 in the basilic vein.   The left cephalic vein has an area that does not compress in the mid  graft.  The left basilic vein is compressible.   Diameter measurements range from 0.66 to 0.45 in the basilic vein.   See attached worksheet for all measurements.   IMPRESSION:  Patent right cephalic vein and patent right and left  basilic veins with diameters as noted above.   ___________________________________________  Quita Skye. Hart [place], M.D.   NT/MEDQ  D:  02/05/2010  T:  02/05/2010  Job:  [id number]

## 2011-01-21 NOTE — Assessment & Plan Note (Signed)
OFFICE VISIT   Trevor [name], Trevor [name] A  DOB:  03/10/1950                                       06/07/2008  CHART#:[id number]   The patient is a 60 year old male who had a left radiocephalic AV  fistula placed on 12/14/2007.  He presents today for further evaluation  and nonmaturation of the fistula.   PHYSICAL EXAMINATION:  On physical exam there is a palpable thrill in  the fistula.  The incision is well-healed.  However, most of the veins  that are dilating up are on the medial aspect of the forearm.  There are  large numbers of side branches and the vein is quite tortuous.  There is  one main channel which seems to dilate up with placement of a tourniquet  on the upper arm.   The patient currently is not on dialysis.  Rather than placing a new  fistula at this time I would prefer to continue to see if the fistula  will mature over time.  If Dr. Hyman Hopes believes that dialysis is going to  be required sooner rather than later then I believe the next option  would be considering placing a forearm graft or considering an upper arm  fistula.  I discussed this with the patient today.  I will await word  from Dr. Hyman Hopes on whether or not he wishes [place] to consider a new fistula  at this time.  Hopefully the area on the forearm will continue to mature  and possibly be cannulatable at the time he requires dialysis.   Janetta Hora. Fields, MD  Electronically Signed   CEF/MEDQ  D:  06/07/2008  T:  06/08/2008  Job:  [id number]   cc:   Garnetta Buddy, M.D.

## 2011-01-21 NOTE — Assessment & Plan Note (Signed)
OFFICE VISIT   OBINNA, EHRESMAN A  DOB:  03/10/1950                                       02/05/2010  CHART#:[id number]   The patient returns today for further evaluation for vascular access.  Dr. Hyman Hopes felt that the patient's left upper arm AV fistula was too deep  and needed to be rerouted.  The patient has had two access procedures in  the left upper extremity performed by Dr. Darrick Penna, initially a left  radiocephalic fistula in April of 2009 which did not progress adequately  and second a left upper arm AV fistula in March of 2010 which has  remained patent.  There are multiple branches emanating from this.  He  has never been on hemodialysis.  He is ambidexterous.   CHRONIC MEDICAL PROBLEMS:  1. Hypertension.  2. Congestive heart failure.  3. Renal insufficiency.  4. Secondary hypoparathyroidism.  5. Hypertension.   SOCIAL HISTORY:  He is single.  He does not use tobacco.  Drinks  occasional alcohol.   REVIEW OF SYSTEMS:  Is positive for dyspnea on exertion, chronic renal  insufficiency, urinary frequency.  All other systems in review of  systems are negative.   PHYSICAL EXAMINATION:  Vital signs:  Blood pressure 194/121, heart rate  83, respirations 14.  General:  He is a well-developed, well-nourished  male who is in no apparent distress, alert and oriented times 3.  HEENT:  Exam is normal for his age.  EOMs intact.  Lungs:  Clear to  auscultation.  Abdomen:  Soft, nontender with no masses.  Upper  extremity exam reveals 3+ brachial and radial pulses bilaterally.  Left  arm has an upper arm fistula with a good pulse and palpable thrill with  multiple branches emanating from that.  The fistula totally occludes at  the mid upper arm and the cephalic vein goes to a very small diminutive  size.  This was confirmed by SonoSite exam by me as well as the vascular  lab.  Bilateral basilic vein mapping and right upper extremity cephalic  vein mapping were  performed today.  He could be a candidate for basilic  vein transposition.  His right cephalic vein appears to be adequate size  and it is very superficial.   I discussed the options with him.  He would like to attempt right upper  arm fistula as the next step.  We scheduled that for Monday June 27 by  Dr. Darrick Penna as an outpatient.  Hopefully this will provide satisfactory  site for vascular access.  If this is unsuccessful he will need a  basilic vein transposition.     Quita Skye Hart [place], M.D.  Electronically Signed   JDL/MEDQ  D:  02/05/2010  T:  02/06/2010  Job:  [id number]

## 2011-01-21 NOTE — Assessment & Plan Note (Signed)
OFFICE VISIT   Trevor [name], Trevor [name]  DOB:  29-Apr-1951                                       04/18/2010  CHART#:[id number]   I saw the patient in the office today to check on the maturation of his  right upper arm AV fistula.  This was placed by Dr. Darrick Penna on  03/04/2010.  He was noted to have [name] 3 mm cephalic vein on the right.  He  subsequently had [name] catheter placed on 04/05/2010 and he is now on  dialysis on Mondays, Wednesdays and Fridays.  He has had no specific  complaints referable to his fistula and has overall been doing well.  He  has had no recent uremic symptoms.  Specifically he denies any problems  with fatigue, anorexia, nausea, vomiting, palpitations or shortness of  breath.   PHYSICAL EXAMINATION:  Blood pressure is 135/82, heart rate is 73.  Temperature 97.7.  His incision in the right arm has healed nicely and  he has [name] good thrill in his fistula.  The vein is somewhat tortuous but  appears to be gradually maturing.   I have encouraged him to continue to exercise his arm.  Hopefully this  would be ready for use in the next month or so.  He does state that he  is wanting to consider peritoneal dialysis and plans on discussing this  with his nephrologist.  We will see him back p.r.n.     Di Kindle. Edilia Bo, M.D.  Electronically Signed   CSD/MEDQ  D:  04/18/2010  T:  04/19/2010  Job:  [id number]   cc:   [facility]

## 2011-01-21 NOTE — Assessment & Plan Note (Signed)
OFFICE VISIT   Trevor [name], Trevor [name]  DOB:  03/10/1950                                       01/10/2009  CHART#:[id number]   The patient returns today for followup after placement of [name] left  brachiocephalic AV fistula of March 29.   On exam today the fistula is maturing nicely.  There is an easily  palpable thrill and audible bruit.  The vein has dilated up  significantly in the upper arm.   Overall, the patient appears to be doing well.  His fistula should be  ready for use if he requires dialysis at some point in the near future.  He will follow up with me on an as-needed basis.   Janetta Hora. Fields, MD  Electronically Signed   CEF/MEDQ  D:  01/10/2009  T:  01/11/2009  Job:  [id number]   cc:   Garnetta Buddy, M.D.

## 2011-01-21 NOTE — Assessment & Plan Note (Signed)
OFFICE VISIT   Trevor [name], Trevor [name]  DOB:  03/10/1950                                       11/08/2008  CHART#:[id number]   The patient is [name] 60 year old male who currently is not on hemodialysis.  He previously had [name] left radiocephalic AV fistula placed in April 2009.  It was noted on recent exam by Dr. Hyman Hopes that the fistula had occluded.  He presents today for consideration of placement of [name] new hemodialysis  access.   On physical exam today, blood pressure is 157/93 in the right arm.  Pulse is 59 and regular.  Upper extremity shows 2+ brachial and radial  pulses bilaterally.  Left radiocephalic AV fistula has no flow within it  by Doppler or by physical exam.  He does have [name] palpable cephalic vein  in the right upper extremity on placement of [name] tourniquet.  He is right-  handed.   He had [name] vein mapping ultrasound performed today which shows that the  cephalic vein in the left upper arm is between 28 and 35 mm in diameter.  The cephalic vein in the right upper arm was between 28 and 35 mm in  diameter as well.  The cephalic vein in the right forearm was fairly  small.   I believe the best option for the patient would be placement of [name] left  brachiocephalic AV fistula.  We will have this scheduled for him in the  near future.  The risks, benefits, possible complications and procedure  details were explained to the patient today.  These included but were  not limited to bleeding, infection steal and non-maturation of the  fistula.   Trevor Hora. Fields, MD  Electronically Signed   CEF/MEDQ  D:  11/09/2008  T:  11/09/2008  Job:  [id number]   cc:   Trevor [name], M.D.

## 2011-01-21 NOTE — Op Note (Signed)
NAMELADISLAUS, REPSHER                ACCOUNT NO.:  [id number]   MEDICAL RECORD NO.:  [id number]          PATIENT TYPE:  INP   LOCATION:  4736                         FACILITY:  [facility]   PHYSICIAN:  Janetta Hora. Fields, MD  DATE OF BIRTH:  03/10/1950   DATE OF PROCEDURE:  12/14/2007  DATE OF DISCHARGE:                               OPERATIVE REPORT   DATE OF OPERATION:  December 14, 2007   PROCEDURE:  Left radiocephalic arteriovenous fistula.   PREOPERATIVE DIAGNOSIS:  End-stage renal disease.   POSTOPERATIVE DIAGNOSIS:  End-stage renal disease.   ANESTHESIA:  Local with IV sedation.   ASSISTANT:  Emilio Aspen  RNFA   OPERATIVE FINDINGS:  A 2.5 mm cephalic vein.   OPERATIVE DETAILS:  After obtaining informed consent, the patient was  taken to the operating room.  The patient was placed in the supine  position on the operating table.  After adequate sedation, the patient's  entire left upper extremity was prepped and draped in the usual sterile  fashion.  Local anesthesia was infiltrated midway between the cephalic  vein and radial artery.  A longitudinal incision was made in this  location near the wrist.  The incision was carried down through the  subcutaneous tissues.  The cephalic vein was dissected free  circumferentially.  There were several side branches.  The vein was  approximately 2.5 mm in diameter.  Next, the adjacent radial artery was  dissected free circumferentially.  This was approximately 3-3.5 mm in  diameter.  Small side branches were clipped.  Next, the patient was  given 5000 units of intravenous heparin.  This distal cephalic vein was  ligated with a 2-0 silk tie.  The vein transected and swung over to the  level of the artery.  The artery was controlled proximally and distally  with Vesseloops.  A longitudinal opening was made in the artery.  The  vein was gently distended and flushed with heparinized saline and then  sewn end to vein to side of artery using a  running 7-0 Prolene suture.  Just prior to completion anastomosis __________  and thoroughly flushed.  The anastomosis was secured.  The Vesseloops were released and there was  a palpable thrill in the proximal fistula immediately.  Next, hemostasis  was obtained.  The subcutaneous tissues were reapproximated using a  running 3-0 Vicryl suture.  The skin was closed with a 4-0 Vicryl  subcuticular stitch.  The patient tolerated procedure well and there  were no complications.  Instrument, sponge and needle counts were  correct at the end of the case.  The patient was taken to the recovery  room in stable condition.      Janetta Hora. Fields, MD  Electronically Signed    CEF/MEDQ  D:  12/14/2007  T:  12/14/2007  Job:  [id number]
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
## 2011-02-14 ENCOUNTER — Encounter: Payer: Self-pay | Admitting: Internal Medicine

## 2011-05-01 ENCOUNTER — Other Ambulatory Visit (HOSPITAL_COMMUNITY): Payer: Self-pay | Admitting: Orthopedic Surgery

## 2011-05-01 ENCOUNTER — Encounter (HOSPITAL_COMMUNITY): Payer: Medicare Other

## 2011-05-01 ENCOUNTER — Other Ambulatory Visit: Payer: Self-pay | Admitting: Orthopedic Surgery

## 2011-05-01 ENCOUNTER — Ambulatory Visit (HOSPITAL_COMMUNITY)
Admission: RE | Admit: 2011-05-01 | Discharge: 2011-05-01 | Disposition: A | Discharge status: Home / Self Care | Payer: Medicare Other | Source: Ambulatory Visit | Attending: Orthopedic Surgery | Admitting: Orthopedic Surgery

## 2011-05-01 DIAGNOSIS — X58XXXA Exposure to other specified factors, initial encounter: Secondary | ICD-10-CM | POA: Insufficient documentation

## 2011-05-01 DIAGNOSIS — Z01811 Encounter for preprocedural respiratory examination: Secondary | ICD-10-CM | POA: Insufficient documentation

## 2011-05-01 DIAGNOSIS — Z01818 Encounter for other preprocedural examination: Secondary | ICD-10-CM

## 2011-05-01 DIAGNOSIS — IMO0002 Reserved for concepts with insufficient information to code with codable children: Secondary | ICD-10-CM | POA: Insufficient documentation

## 2011-05-01 DIAGNOSIS — Z01812 Encounter for preprocedural laboratory examination: Secondary | ICD-10-CM | POA: Insufficient documentation

## 2011-05-01 DIAGNOSIS — I77819 Aortic ectasia, unspecified site: Secondary | ICD-10-CM | POA: Insufficient documentation

## 2011-05-01 LAB — SURGICAL PCR SCREEN
MRSA, PCR: NEGATIVE
Staphylococcus aureus: NEGATIVE

## 2011-05-01 LAB — URINALYSIS, ROUTINE W REFLEX MICROSCOPIC
Glucose, UA: NEGATIVE mg/dL
Nitrite: NEGATIVE
Protein, ur: >300 mg/dL — AB
Specific Gravity, Urine: 1.019 (ref 1.005–1.030)
Urobilinogen, UA: 0.2 mg/dL (ref 0.0–1.0)
pH: 6.5 (ref 5.0–8.0)

## 2011-05-01 LAB — URINE MICROSCOPIC-ADD ON

## 2011-05-08 ENCOUNTER — Ambulatory Visit (HOSPITAL_COMMUNITY)
Admission: RE | Admit: 2011-05-08 | Discharge: 2011-05-08 | Disposition: A | Discharge status: Home / Self Care | Payer: Medicare Other | Source: Ambulatory Visit | Attending: Orthopedic Surgery | Admitting: Orthopedic Surgery

## 2011-05-08 DIAGNOSIS — M2350 Chronic instability of knee, unspecified knee: Secondary | ICD-10-CM | POA: Insufficient documentation

## 2011-05-08 DIAGNOSIS — M23319 Other meniscus derangements, anterior horn of medial meniscus, unspecified knee: Secondary | ICD-10-CM | POA: Insufficient documentation

## 2011-05-08 DIAGNOSIS — Z01812 Encounter for preprocedural laboratory examination: Secondary | ICD-10-CM | POA: Insufficient documentation

## 2011-05-08 DIAGNOSIS — I120 Hypertensive chronic kidney disease with stage 5 chronic kidney disease or end stage renal disease: Secondary | ICD-10-CM | POA: Insufficient documentation

## 2011-05-08 DIAGNOSIS — I509 Heart failure, unspecified: Secondary | ICD-10-CM | POA: Insufficient documentation

## 2011-05-08 DIAGNOSIS — M23302 Other meniscus derangements, unspecified lateral meniscus, unspecified knee: Secondary | ICD-10-CM | POA: Insufficient documentation

## 2011-05-08 DIAGNOSIS — I252 Old myocardial infarction: Secondary | ICD-10-CM | POA: Insufficient documentation

## 2011-05-08 DIAGNOSIS — N186 End stage renal disease: Secondary | ICD-10-CM | POA: Insufficient documentation

## 2011-05-08 DIAGNOSIS — M1710 Unilateral primary osteoarthritis, unspecified knee: Secondary | ICD-10-CM | POA: Insufficient documentation

## 2011-05-08 DIAGNOSIS — Z01818 Encounter for other preprocedural examination: Secondary | ICD-10-CM | POA: Insufficient documentation

## 2011-05-08 LAB — DIFFERENTIAL
Basophils Absolute: 0 10*3/uL (ref 0.0–0.1)
Basophils Relative: 1 % (ref 0–1)
Eosinophils Absolute: 0.1 10*3/uL (ref 0.0–0.7)
Monocytes Relative: 13 % — ABNORMAL HIGH (ref 3–12)
Neutro Abs: 2 10*3/uL (ref 1.7–7.7)
Neutrophils Relative %: 54 % (ref 43–77)

## 2011-05-08 LAB — CBC
MCH: 29 pg (ref 26.0–34.0)
MCHC: 32.6 g/dL (ref 30.0–36.0)
Platelets: 184 10*3/uL (ref 150–400)
RBC: 4.34 MIL/uL (ref 4.22–5.81)

## 2011-05-08 LAB — PROTIME-INR
INR: 1.05 (ref 0.00–1.49)
Prothrombin Time: 13.9 seconds (ref 11.6–15.2)

## 2011-05-08 LAB — COMPREHENSIVE METABOLIC PANEL
Albumin: 4 g/dL (ref 3.5–5.2)
BUN: 42 mg/dL — ABNORMAL HIGH (ref 6–23)
Calcium: 11.1 mg/dL — ABNORMAL HIGH (ref 8.4–10.5)
GFR calc Af Amer: 6 mL/min — ABNORMAL LOW (ref >60)
Glucose, Bld: 82 mg/dL (ref 70–99)
Sodium: 137 mEq/L (ref 135–145)
Total Protein: 9 g/dL — ABNORMAL HIGH (ref 6.0–8.3)

## 2011-05-13 NOTE — Op Note (Signed)
NAMEABRAHM, Trevor [name]                ACCOUNT NO.:  [id number]  MEDICAL RECORD NO.:  [id number]  LOCATION:  DAYL                         FACILITY:  [facility]  PHYSICIAN:  Georges Lynch. Idalis Hoelting, M.D.DATE OF BIRTH:  1951-08-05  DATE OF PROCEDURE:  05/08/2011 DATE OF DISCHARGE:  05/08/2011                              OPERATIVE REPORT   SURGEON:  Windy Fast A. Darrelyn Hillock, M.D.  PREOPERATIVE DIAGNOSES: 1. Severe degenerative arthritis of the left knee. 2. Complete complex tear of the lateral meniscus left knee. 3. Torn medial meniscus left knee. 4. Complete versus partial tear of the anterior cruciate ligament. 5.  POSTOPERATIVE DIAGNOSES: 1. Severe degenerative arthritis of the left knee. 2. Complete complex tear of the lateral meniscus left knee. 3. Torn medial meniscus left knee. 4. Partial tear of the anterior cruciate ligament.  OPERATIONS: 1. Diagnostic arthroscopy left knee. 2. Medial meniscectomy of the anterior horn left knee. 3. Complete lateral meniscectomy left knee. 4. Abrasion chondroplasty of patella left knee. 5. Synovectomy suprapatellar pouch left knee.  DETAILS OF THE PROCEDURE:  Under general anesthesia routine orthopedic prep and draping of the left lower extremity was carried out with the left leg in the knee holder.  The appropriate time-out was carried out before we made an incision.  I also marked the appropriate left leg in the holding area.  This time a small punctate incision was made in the suprapatellar pouch, inflow cannula was inserted, and it was distended with Ringer's lactate.  Another small punctate incision made in the anterolateral joint.  The arthroscope was entered the lateral approach and a complete diagnostic arthroscopy was carried out.  After the arthroscope was entered, complete diagnostic arthroscopy was carried out.  At this time, went up in the suprapatellar pouch did abrasion chondroplasty of the patella.  I then did a synovectomy at the  same time.  Following that, I went down into the medial joint and he had a large tear, that was chronic in the anterior portion of the medial meniscus.  I introduced a shaver suction device did a partial medial meniscectomy.  He had early-to-moderate arthritic changes in the medial joint.  I inspected the ACL.  He had about the three-quarters of the ACL was completely torn.  I did a partial debridement.  I then went over the lateral joint.  He had severe degenerative changes.  He had complete absence of the articular cartilage of the lateral joint and lateral femoral condyle and lateral tibial plateau.  He had a severe complex tear of the lateral meniscus.  I introduced a shaver suction device and did a partial lateral meniscectomy.  I thoroughly irrigated out the knee injected 30 mL of 0.25% Marcaine epinephrine in the knee joint.  All three punctate incision was closed with 3-0 nylon suture.  Sterile Neosporin dressing was applied.  I liked this.  He definitely going to need a total knee arthroplasty in the future as I told him preop there was no sense in making a new ACL for him as: 1. Age is one thing. 2. He is dialysis patient. 3. He is going need a total knee arthroplasty.  POSTOPERATIVE MEDICINES: 1. He will be on Dilaudid  2 mg every 4 hours p.r.n. for pain. 2. Aspirin 325 mg b.i.d. today and b.i.d. for 2 weeks as an     anticoagulant. 3. He will be on crutches partial and full weightbearing as tolerated. 4. See him in the office in 12-14 days or prior to have the problem.          ______________________________ Georges Lynch Darrelyn Hillock, M.D.     RAG/MEDQ  D:  05/08/2011  T:  05/09/2011  Job:  [id number]  Electronically Signed by Ranee Gosselin M.D. on 05/13/2011 07:48:17 AM

## 2011-06-03 LAB — POCT I-STAT, CHEM 8
BUN: 58 — ABNORMAL HIGH
Calcium, Ion: 1.06 — ABNORMAL LOW
Chloride: 108
Glucose, Bld: 88
HCT: 41

## 2011-06-03 LAB — LIPID PANEL
Cholesterol: 92
LDL Cholesterol: 46
Triglycerides: 95
VLDL: 19

## 2011-06-03 LAB — CARDIAC PANEL(CRET KIN+CKTOT+MB+TROPI)
CK, MB: 5.9 — ABNORMAL HIGH
Relative Index: 1.3
Total CK: 444 — ABNORMAL HIGH

## 2011-06-03 LAB — BASIC METABOLIC PANEL
Calcium: 8.4
Creatinine, Ser: 5 — ABNORMAL HIGH
GFR calc Af Amer: 15 — ABNORMAL LOW
GFR calc non Af Amer: 12 — ABNORMAL LOW
Glucose, Bld: 99
Sodium: 138

## 2011-06-03 LAB — CBC
HCT: 33.8 — ABNORMAL LOW
HCT: 35.2 — ABNORMAL LOW
Hemoglobin: 11 — ABNORMAL LOW
Hemoglobin: 11.4 — ABNORMAL LOW
Hemoglobin: 12.7 — ABNORMAL LOW
MCHC: 32.2
MCV: 73.7 — ABNORMAL LOW
MCV: 74.1 — ABNORMAL LOW
Platelets: 240
RBC: 4.59
RBC: 4.75
RBC: 5.34
RDW: 18.1 — ABNORMAL HIGH
WBC: 2.5 — ABNORMAL LOW
WBC: 2.9 — ABNORMAL LOW

## 2011-06-03 LAB — URINE CULTURE
Culture: NO GROWTH
Special Requests: NEGATIVE

## 2011-06-03 LAB — RENAL FUNCTION PANEL
Albumin: 2.3 — ABNORMAL LOW
Albumin: 2.5 — ABNORMAL LOW
Albumin: 2.5 — ABNORMAL LOW
BUN: 48 — ABNORMAL HIGH
CO2: 23
CO2: 24
Calcium: 8.2 — ABNORMAL LOW
Chloride: 103
GFR calc Af Amer: 14 — ABNORMAL LOW
GFR calc Af Amer: 15 — ABNORMAL LOW
GFR calc non Af Amer: 12 — ABNORMAL LOW
GFR calc non Af Amer: 12 — ABNORMAL LOW
GFR calc non Af Amer: 12 — ABNORMAL LOW
Glucose, Bld: 114 — ABNORMAL HIGH
Glucose, Bld: 99
Phosphorus: 3.8
Phosphorus: 4.5
Phosphorus: 4.6
Potassium: 3.2 — ABNORMAL LOW
Potassium: 3.4 — ABNORMAL LOW
Potassium: 3.4 — ABNORMAL LOW
Sodium: 138
Sodium: 139
Sodium: 139
Sodium: 140

## 2011-06-03 LAB — POCT CARDIAC MARKERS: Troponin i, poc: <0.05

## 2011-06-03 LAB — PTH, INTACT AND CALCIUM: PTH: 387.1 — ABNORMAL HIGH

## 2011-06-03 LAB — FOLATE: Folate: 8.5

## 2011-06-03 LAB — RETICULOCYTES: RBC.: 5.35

## 2011-06-03 LAB — URINALYSIS, ROUTINE W REFLEX MICROSCOPIC
Bilirubin Urine: NEGATIVE
Glucose, UA: NEGATIVE
Specific Gravity, Urine: 1.011
pH: 6

## 2011-06-03 LAB — DIFFERENTIAL
Basophils Relative: 1
Monocytes Absolute: 0.5
Monocytes Relative: 17 — ABNORMAL HIGH
Neutro Abs: 1.9

## 2011-06-03 LAB — CK TOTAL AND CKMB (NOT AT ARMC)
CK, MB: 7.8 — ABNORMAL HIGH
Relative Index: 1.4

## 2011-06-03 LAB — PROTEIN, URINE, 24 HOUR
Collection Interval-UPROT: 24
Protein, 24H Urine: 6028 — ABNORMAL HIGH

## 2011-06-03 LAB — URINE MICROSCOPIC-ADD ON

## 2011-06-03 LAB — HEPATIC FUNCTION PANEL
ALT: 17
AST: 25
Albumin: 2.7 — ABNORMAL LOW
Total Protein: 6.2

## 2011-06-03 LAB — HEMOGLOBIN A1C
Hgb A1c MFr Bld: 5.7
Mean Plasma Glucose: 126

## 2011-06-03 LAB — ANA: Anti Nuclear Antibody(ANA): NEGATIVE

## 2011-06-03 LAB — IRON AND TIBC: UIBC: 307

## 2011-06-03 LAB — RAPID URINE DRUG SCREEN, HOSP PERFORMED
Barbiturates: NOT DETECTED
Benzodiazepines: NOT DETECTED

## 2011-06-03 LAB — MAGNESIUM: Magnesium: 2.1

## 2011-06-26 LAB — POCT I-STAT CREATININE
Creatinine, Ser: 4.7 — ABNORMAL HIGH
Operator id: 288831

## 2011-06-26 LAB — CBC
MCV: 76.7 — ABNORMAL LOW
Platelets: 284
RBC: 4.46
WBC: 6.2

## 2011-06-26 LAB — CK: Total CK: 1131 — ABNORMAL HIGH

## 2011-06-26 LAB — URINALYSIS, ROUTINE W REFLEX MICROSCOPIC
Bilirubin Urine: NEGATIVE
Glucose, UA: NEGATIVE
Protein, ur: >300 — AB
Urobilinogen, UA: 0.2

## 2011-06-26 LAB — DIFFERENTIAL
Lymphocytes Relative: 9 — ABNORMAL LOW
Lymphs Abs: 0.6 — ABNORMAL LOW
Monocytes Relative: 18 — ABNORMAL HIGH
Neutro Abs: 4.4
Neutrophils Relative %: 71

## 2011-06-26 LAB — URINE MICROSCOPIC-ADD ON

## 2011-06-26 LAB — I-STAT 8, (EC8 V) (CONVERTED LAB)
Acid-base deficit: 2
BUN: 44 — ABNORMAL HIGH
Chloride: 106
HCT: 36 — ABNORMAL LOW
Hemoglobin: 12.2 — ABNORMAL LOW
Operator id: 288831
Potassium: 3.3 — ABNORMAL LOW
Sodium: 135

## 2011-06-26 LAB — D-DIMER, QUANTITATIVE: D-Dimer, Quant: 2.61 — ABNORMAL HIGH

## 2011-07-09 ENCOUNTER — Other Ambulatory Visit (HOSPITAL_COMMUNITY): Payer: Self-pay | Admitting: Nephrology

## 2011-07-09 DIAGNOSIS — N186 End stage renal disease: Secondary | ICD-10-CM

## 2011-07-24 ENCOUNTER — Ambulatory Visit (HOSPITAL_COMMUNITY): Payer: Medicare Other

## 2011-08-19 ENCOUNTER — Ambulatory Visit (HOSPITAL_COMMUNITY)
Admission: RE | Admit: 2011-08-19 | Discharge: 2011-08-19 | Disposition: A | Discharge status: Home / Self Care | Payer: Medicare Other | Source: Ambulatory Visit | Attending: Nephrology | Admitting: Nephrology

## 2011-08-19 ENCOUNTER — Other Ambulatory Visit (HOSPITAL_COMMUNITY): Payer: Self-pay | Admitting: Nephrology

## 2011-08-19 DIAGNOSIS — Y832 Surgical operation with anastomosis, bypass or graft as the cause of abnormal reaction of the patient, or of later complication, without mention of misadventure at the time of the procedure: Secondary | ICD-10-CM | POA: Insufficient documentation

## 2011-08-19 DIAGNOSIS — N186 End stage renal disease: Secondary | ICD-10-CM | POA: Insufficient documentation

## 2011-08-19 DIAGNOSIS — T82898A Other specified complication of vascular prosthetic devices, implants and grafts, initial encounter: Secondary | ICD-10-CM | POA: Insufficient documentation

## 2011-08-19 DIAGNOSIS — I871 Compression of vein: Secondary | ICD-10-CM | POA: Insufficient documentation

## 2011-08-19 MED ORDER — IOHEXOL 300 MG/ML  SOLN
100.0000 mL | Freq: Once | INTRAMUSCULAR | Status: AC | PRN
  Administered 2011-08-19: 40 mL via INTRAVENOUS

## 2011-08-19 NOTE — Procedures (Signed)
Tight central cephalic stenosis, needs PTA. Pt wished to resched tx. No complication No blood loss. See complete dictation in [facility].

## 2011-08-20 ENCOUNTER — Encounter (HOSPITAL_COMMUNITY): Payer: Self-pay

## 2011-08-20 ENCOUNTER — Ambulatory Visit (HOSPITAL_COMMUNITY)
Admission: RE | Admit: 2011-08-20 | Discharge: 2011-08-20 | Disposition: A | Discharge status: Home / Self Care | Payer: Medicare Other | Source: Ambulatory Visit | Attending: Nephrology | Admitting: Nephrology

## 2011-08-20 DIAGNOSIS — T82898A Other specified complication of vascular prosthetic devices, implants and grafts, initial encounter: Secondary | ICD-10-CM | POA: Insufficient documentation

## 2011-08-20 DIAGNOSIS — N186 End stage renal disease: Secondary | ICD-10-CM | POA: Insufficient documentation

## 2011-08-20 DIAGNOSIS — Z992 Dependence on renal dialysis: Secondary | ICD-10-CM | POA: Insufficient documentation

## 2011-08-20 DIAGNOSIS — Y832 Surgical operation with anastomosis, bypass or graft as the cause of abnormal reaction of the patient, or of later complication, without mention of misadventure at the time of the procedure: Secondary | ICD-10-CM | POA: Insufficient documentation

## 2011-08-20 DIAGNOSIS — I871 Compression of vein: Secondary | ICD-10-CM | POA: Insufficient documentation

## 2011-08-20 MED ORDER — IOHEXOL 300 MG/ML  SOLN
100.0000 mL | Freq: Once | INTRAMUSCULAR | Status: AC | PRN
  Administered 2011-08-20: 60 mL via INTRAVENOUS

## 2011-08-20 MED ORDER — MIDAZOLAM HCL 2 MG/2ML IJ SOLN
INTRAMUSCULAR | Status: AC
  Filled 2011-08-20: qty 6

## 2011-08-20 MED ORDER — FENTANYL CITRATE 0.05 MG/ML IJ SOLN
INTRAMUSCULAR | Status: AC
  Filled 2011-08-20: qty 4

## 2011-08-20 MED ORDER — ALTEPLASE 2 MG IJ SOLR
2.0000 mg | INTRAMUSCULAR | Status: DC
  Filled 2011-08-20: qty 2

## 2011-08-20 MED ORDER — MIDAZOLAM HCL 5 MG/5ML IJ SOLN
INTRAMUSCULAR | Status: DC | PRN
  Administered 2011-08-20: 2 mg via INTRAVENOUS

## 2011-08-20 MED ORDER — SODIUM CHLORIDE 0.9 % IV SOLN
INTRAVENOUS | Status: DC | PRN
  Administered 2011-08-20: 50 mL/h via INTRAVENOUS

## 2011-08-20 MED ORDER — FENTANYL CITRATE 0.05 MG/ML IJ SOLN
INTRAMUSCULAR | Status: DC | PRN
  Administered 2011-08-20: 50 ug via INTRAVENOUS

## 2011-08-20 NOTE — ED Notes (Signed)
Discharge instructions given to pt and his wife.  Pt informed not to drive or make legal decisions for the rest of the day.

## 2011-08-20 NOTE — H&P (Signed)
Trevor [name] is an 60 y.o. male.   Chief Complaint: central cephalic stenosis as per fistulogram on 08/19/11.  Patient presents today for angioplasty and possible stent placement with moderate sedation. HPI: ESRD on HD with decrease in clearance and prolonged bleeding.  Patient is s/p HD today with no new issues.  He is NPO for moderate sedation for angioplasty.  Past Medical History  Diagnosis Date  . Anemia   . CRF (chronic renal failure)     Stage 4  . AVF (arteriovenous fistula)     Left  . Secondary hyperparathyroidism   . Hypovitaminosis D   . Hypertensive urgency     H/o    Past Surgical History  Procedure Date  . Left avf 12/14/07    Dr. Charlean Sanfilippo  . Right avf   . Left knee arthroscopy     Family History  Problem Relation Age of Onset  . Hypertension Father   . Kidney disease Father   . Allergies Father    Social History:  reports that he has quit smoking. He does not have any smokeless tobacco history on file. He reports that he drinks alcohol. He reports that he does not use illicit drugs.  Allergies:  Allergies  Allergen Reactions  . Oxycodone-Acetaminophen     REACTION: nausea    Medications Prior to Admission  Medication Sig Dispense Refill  . amLODipine (NORVASC) 10 MG tablet Take 10 mg by mouth daily.        Marland Kitchen aspirin (ANACIN) 81 MG EC tablet Take 81 mg by mouth daily.        . sevelamer (RENAGEL) 800 MG tablet Take 800 mg by mouth 3 (three) times daily with meals.        . cloNIDine (CATAPRES) 0.1 MG tablet Take 0.1 mg by mouth at bedtime.        . hydrALAZINE (APRESOLINE) 50 MG tablet Take 50 mg by mouth 2 (two) times daily.        Marland Kitchen labetalol (TRANDATE) 200 MG tablet Take 200 mg by mouth 2 (two) times daily.        . multivitamin (RENA-VIT) TABS tablet Take 1 tablet by mouth daily.         Medications Prior to Admission  Medication Dose Route Frequency Provider Last Rate Last Dose  . alteplase (CATHFLO ACTIVASE) injection 2 mg  2 mg Intracatheter  Once Casimiro Needle T. Shick      . iohexol (OMNIPAQUE) 300 MG/ML solution 100 mL  100 mL Intravenous Once PRN Medication Radiologist   40 mL at 08/19/11 0821    No results found for this or any previous visit (from the past 48 hour(s)). Ir Shuntogram/fistulagram Right  08/19/2011  *RADIOLOGY REPORT*  Clinical Data: End stage renal disease.  Decreased clearance during dialysis, prolonged bleeding.  IR RIGHT SHUNTOGRAM/FISTULAGRAM  Comparison: None.  Findings: An 18 gauge angiocatheter was placed in the outflow vein for dialysis fistulography.  Induced reflux across the fistula between brachial artery and cephalic vein at the elbow shows this to be widely patent.  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  A few small competing outflow veins are noted.  The more central aspect of the outflow cephalic vein is tortuous.  There is a high grade focal stenosis at the confluence of the cephalic vein into the axillary vein.  The more central venous structures through the SVC are widely patent.  IMPRESSION:  1.  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  Findings were discussed with the patient.  He elected to schedule treatment at a later date to allow sedation and transportation plans.  Original Report Authenticated By: Osa Craver, M.D.    Review of Systems  Constitutional: Negative.   HENT: Negative.   Eyes: Negative.   Respiratory: Negative.  Negative for sputum production.   Cardiovascular: Negative.   Gastrointestinal: Negative.   Skin: Negative.   Neurological: Negative.   Psychiatric/Behavioral: Negative.     Blood pressure 133/93, pulse 76, temperature 98 F (36.7 C), temperature source Oral, resp. rate 16, height 6\' 1"  (1.854 m), weight 245 lb (111.131 kg), SpO2 98.00%. Physical Exam  Constitutional: He is oriented to  person, place, and time. He appears well-developed and well-nourished.  HENT:  Head: Normocephalic and atraumatic.  Eyes: EOM are normal.  Cardiovascular: Normal rate, regular rhythm and normal heart sounds.  Exam reveals no gallop and no friction rub.   No murmur heard. Respiratory: Effort normal and breath sounds normal. He has no wheezes. He has no rales.  GI: Soft. Bowel sounds are normal.  Musculoskeletal: Normal range of motion.  Neurological: He is alert and oriented to person, place, and time.  Skin: Skin is warm and dry.  Psychiatric: He has a normal mood and affect. His behavior is normal. Judgment and thought content normal.     Assessment/Plan Central cephalic vessel stenosis as per fistulogram with prolonged bleeding and decrease in clearance with HD.  Patient's exam reveals he is stable for moderate sedation during procedure.   CAMPBELL,PAMELA D 08/20/2011, 2:37 PM

## 2011-08-20 NOTE — Procedures (Signed)
Right upper extremity fistulogram demonstrates tortuous cephalic vein and concern for critical stenosis at central cephalic vein.  This area angioplastied twice with 6 mm balloon.  Minimal change in cephalic vein after angioplasty and suggestive for an elastic lesion.

## 2011-08-22 NOTE — H&P (Signed)
Agree with PA note. 

## 2011-09-10 DIAGNOSIS — D509 Iron deficiency anemia, unspecified: Secondary | ICD-10-CM | POA: Diagnosis not present

## 2011-09-10 DIAGNOSIS — N2581 Secondary hyperparathyroidism of renal origin: Secondary | ICD-10-CM | POA: Diagnosis not present

## 2011-09-10 DIAGNOSIS — N186 End stage renal disease: Secondary | ICD-10-CM | POA: Diagnosis not present

## 2011-09-10 DIAGNOSIS — D631 Anemia in chronic kidney disease: Secondary | ICD-10-CM | POA: Diagnosis not present

## 2011-09-10 DIAGNOSIS — Z992 Dependence on renal dialysis: Secondary | ICD-10-CM | POA: Diagnosis not present

## 2011-09-10 DIAGNOSIS — R509 Fever, unspecified: Secondary | ICD-10-CM | POA: Diagnosis not present

## 2011-10-06 DIAGNOSIS — R6883 Chills (without fever): Secondary | ICD-10-CM | POA: Diagnosis not present

## 2011-10-09 DIAGNOSIS — N186 End stage renal disease: Secondary | ICD-10-CM | POA: Diagnosis not present

## 2011-10-10 DIAGNOSIS — R509 Fever, unspecified: Secondary | ICD-10-CM | POA: Diagnosis not present

## 2011-10-10 DIAGNOSIS — N186 End stage renal disease: Secondary | ICD-10-CM | POA: Diagnosis not present

## 2011-10-10 DIAGNOSIS — D631 Anemia in chronic kidney disease: Secondary | ICD-10-CM | POA: Diagnosis not present

## 2011-10-10 DIAGNOSIS — A412 Sepsis due to unspecified staphylococcus: Secondary | ICD-10-CM | POA: Diagnosis not present

## 2011-10-10 DIAGNOSIS — B957 Other staphylococcus as the cause of diseases classified elsewhere: Secondary | ICD-10-CM | POA: Diagnosis not present

## 2011-11-05 DIAGNOSIS — N186 End stage renal disease: Secondary | ICD-10-CM | POA: Diagnosis not present

## 2011-11-05 DIAGNOSIS — Z992 Dependence on renal dialysis: Secondary | ICD-10-CM | POA: Diagnosis not present

## 2011-11-05 DIAGNOSIS — D509 Iron deficiency anemia, unspecified: Secondary | ICD-10-CM | POA: Diagnosis not present

## 2011-11-06 DIAGNOSIS — N186 End stage renal disease: Secondary | ICD-10-CM | POA: Diagnosis not present

## 2011-11-07 DIAGNOSIS — Z992 Dependence on renal dialysis: Secondary | ICD-10-CM | POA: Diagnosis not present

## 2011-11-07 DIAGNOSIS — D509 Iron deficiency anemia, unspecified: Secondary | ICD-10-CM | POA: Diagnosis not present

## 2011-11-07 DIAGNOSIS — D631 Anemia in chronic kidney disease: Secondary | ICD-10-CM | POA: Diagnosis not present

## 2011-11-07 DIAGNOSIS — N186 End stage renal disease: Secondary | ICD-10-CM | POA: Diagnosis not present

## 2011-12-03 DIAGNOSIS — H2510 Age-related nuclear cataract, unspecified eye: Secondary | ICD-10-CM | POA: Diagnosis not present

## 2011-12-07 DIAGNOSIS — N186 End stage renal disease: Secondary | ICD-10-CM | POA: Diagnosis not present

## 2011-12-08 DIAGNOSIS — N186 End stage renal disease: Secondary | ICD-10-CM | POA: Diagnosis not present

## 2011-12-08 DIAGNOSIS — D509 Iron deficiency anemia, unspecified: Secondary | ICD-10-CM | POA: Diagnosis not present

## 2011-12-08 DIAGNOSIS — D631 Anemia in chronic kidney disease: Secondary | ICD-10-CM | POA: Diagnosis not present

## 2011-12-08 DIAGNOSIS — Z992 Dependence on renal dialysis: Secondary | ICD-10-CM | POA: Diagnosis not present

## 2011-12-08 DIAGNOSIS — N2581 Secondary hyperparathyroidism of renal origin: Secondary | ICD-10-CM | POA: Diagnosis not present

## 2012-01-02 DIAGNOSIS — N529 Male erectile dysfunction, unspecified: Secondary | ICD-10-CM | POA: Diagnosis not present

## 2012-01-02 DIAGNOSIS — R229 Localized swelling, mass and lump, unspecified: Secondary | ICD-10-CM | POA: Diagnosis not present

## 2012-01-02 DIAGNOSIS — I10 Essential (primary) hypertension: Secondary | ICD-10-CM | POA: Diagnosis not present

## 2012-01-06 DIAGNOSIS — N186 End stage renal disease: Secondary | ICD-10-CM | POA: Diagnosis not present

## 2012-01-07 DIAGNOSIS — Z992 Dependence on renal dialysis: Secondary | ICD-10-CM | POA: Diagnosis not present

## 2012-01-07 DIAGNOSIS — N186 End stage renal disease: Secondary | ICD-10-CM | POA: Diagnosis not present

## 2012-01-07 DIAGNOSIS — D631 Anemia in chronic kidney disease: Secondary | ICD-10-CM | POA: Diagnosis not present

## 2012-01-07 DIAGNOSIS — D509 Iron deficiency anemia, unspecified: Secondary | ICD-10-CM | POA: Diagnosis not present

## 2012-02-06 DIAGNOSIS — N186 End stage renal disease: Secondary | ICD-10-CM | POA: Diagnosis not present

## 2012-02-09 DIAGNOSIS — D631 Anemia in chronic kidney disease: Secondary | ICD-10-CM | POA: Diagnosis not present

## 2012-02-09 DIAGNOSIS — D509 Iron deficiency anemia, unspecified: Secondary | ICD-10-CM | POA: Diagnosis not present

## 2012-02-09 DIAGNOSIS — N186 End stage renal disease: Secondary | ICD-10-CM | POA: Diagnosis not present

## 2012-02-09 DIAGNOSIS — Z992 Dependence on renal dialysis: Secondary | ICD-10-CM | POA: Diagnosis not present

## 2012-02-17 DIAGNOSIS — N486 Induration penis plastica: Secondary | ICD-10-CM | POA: Diagnosis not present

## 2012-03-07 DIAGNOSIS — N186 End stage renal disease: Secondary | ICD-10-CM | POA: Diagnosis not present

## 2012-03-08 DIAGNOSIS — Z992 Dependence on renal dialysis: Secondary | ICD-10-CM | POA: Diagnosis not present

## 2012-03-08 DIAGNOSIS — E119 Type 2 diabetes mellitus without complications: Secondary | ICD-10-CM | POA: Diagnosis not present

## 2012-03-08 DIAGNOSIS — D631 Anemia in chronic kidney disease: Secondary | ICD-10-CM | POA: Diagnosis not present

## 2012-03-08 DIAGNOSIS — N2581 Secondary hyperparathyroidism of renal origin: Secondary | ICD-10-CM | POA: Diagnosis not present

## 2012-03-08 DIAGNOSIS — D509 Iron deficiency anemia, unspecified: Secondary | ICD-10-CM | POA: Diagnosis not present

## 2012-03-08 DIAGNOSIS — N186 End stage renal disease: Secondary | ICD-10-CM | POA: Diagnosis not present

## 2012-04-07 DIAGNOSIS — N186 End stage renal disease: Secondary | ICD-10-CM | POA: Diagnosis not present

## 2012-04-09 DIAGNOSIS — D509 Iron deficiency anemia, unspecified: Secondary | ICD-10-CM | POA: Diagnosis not present

## 2012-04-09 DIAGNOSIS — Z992 Dependence on renal dialysis: Secondary | ICD-10-CM | POA: Diagnosis not present

## 2012-04-09 DIAGNOSIS — N186 End stage renal disease: Secondary | ICD-10-CM | POA: Diagnosis not present

## 2012-04-09 DIAGNOSIS — D631 Anemia in chronic kidney disease: Secondary | ICD-10-CM | POA: Diagnosis not present

## 2012-05-08 DIAGNOSIS — N186 End stage renal disease: Secondary | ICD-10-CM | POA: Diagnosis not present

## 2012-05-10 DIAGNOSIS — D509 Iron deficiency anemia, unspecified: Secondary | ICD-10-CM | POA: Diagnosis not present

## 2012-05-10 DIAGNOSIS — Z992 Dependence on renal dialysis: Secondary | ICD-10-CM | POA: Diagnosis not present

## 2012-05-10 DIAGNOSIS — N186 End stage renal disease: Secondary | ICD-10-CM | POA: Diagnosis not present

## 2012-06-07 DIAGNOSIS — N186 End stage renal disease: Secondary | ICD-10-CM | POA: Diagnosis not present

## 2012-06-09 DIAGNOSIS — N2581 Secondary hyperparathyroidism of renal origin: Secondary | ICD-10-CM | POA: Diagnosis not present

## 2012-06-09 DIAGNOSIS — D509 Iron deficiency anemia, unspecified: Secondary | ICD-10-CM | POA: Diagnosis not present

## 2012-06-09 DIAGNOSIS — N186 End stage renal disease: Secondary | ICD-10-CM | POA: Diagnosis not present

## 2012-06-09 DIAGNOSIS — Z992 Dependence on renal dialysis: Secondary | ICD-10-CM | POA: Diagnosis not present

## 2012-07-08 DIAGNOSIS — N186 End stage renal disease: Secondary | ICD-10-CM | POA: Diagnosis not present

## 2012-07-09 DIAGNOSIS — Z992 Dependence on renal dialysis: Secondary | ICD-10-CM | POA: Diagnosis not present

## 2012-07-09 DIAGNOSIS — N186 End stage renal disease: Secondary | ICD-10-CM | POA: Diagnosis not present

## 2012-07-09 DIAGNOSIS — D509 Iron deficiency anemia, unspecified: Secondary | ICD-10-CM | POA: Diagnosis not present

## 2012-07-09 DIAGNOSIS — D631 Anemia in chronic kidney disease: Secondary | ICD-10-CM | POA: Diagnosis not present

## 2012-08-07 DIAGNOSIS — N186 End stage renal disease: Secondary | ICD-10-CM | POA: Diagnosis not present

## 2012-08-09 DIAGNOSIS — D509 Iron deficiency anemia, unspecified: Secondary | ICD-10-CM | POA: Diagnosis not present

## 2012-08-09 DIAGNOSIS — N186 End stage renal disease: Secondary | ICD-10-CM | POA: Diagnosis not present

## 2012-08-09 DIAGNOSIS — Z992 Dependence on renal dialysis: Secondary | ICD-10-CM | POA: Diagnosis not present

## 2012-08-09 DIAGNOSIS — D631 Anemia in chronic kidney disease: Secondary | ICD-10-CM | POA: Diagnosis not present

## 2012-09-07 DIAGNOSIS — N186 End stage renal disease: Secondary | ICD-10-CM | POA: Diagnosis not present

## 2012-09-10 DIAGNOSIS — N186 End stage renal disease: Secondary | ICD-10-CM | POA: Diagnosis not present

## 2012-09-10 DIAGNOSIS — D509 Iron deficiency anemia, unspecified: Secondary | ICD-10-CM | POA: Diagnosis not present

## 2012-09-10 DIAGNOSIS — D631 Anemia in chronic kidney disease: Secondary | ICD-10-CM | POA: Diagnosis not present

## 2012-09-10 DIAGNOSIS — N2581 Secondary hyperparathyroidism of renal origin: Secondary | ICD-10-CM | POA: Diagnosis not present

## 2012-09-10 DIAGNOSIS — Z992 Dependence on renal dialysis: Secondary | ICD-10-CM | POA: Diagnosis not present

## 2012-10-08 DIAGNOSIS — N186 End stage renal disease: Secondary | ICD-10-CM | POA: Diagnosis not present

## 2012-10-11 DIAGNOSIS — D631 Anemia in chronic kidney disease: Secondary | ICD-10-CM | POA: Diagnosis not present

## 2012-10-11 DIAGNOSIS — N186 End stage renal disease: Secondary | ICD-10-CM | POA: Diagnosis not present

## 2012-10-11 DIAGNOSIS — Z992 Dependence on renal dialysis: Secondary | ICD-10-CM | POA: Diagnosis not present

## 2012-10-11 DIAGNOSIS — D509 Iron deficiency anemia, unspecified: Secondary | ICD-10-CM | POA: Diagnosis not present

## 2012-11-05 DIAGNOSIS — N186 End stage renal disease: Secondary | ICD-10-CM | POA: Diagnosis not present

## 2012-11-08 DIAGNOSIS — N186 End stage renal disease: Secondary | ICD-10-CM | POA: Diagnosis not present

## 2012-11-08 DIAGNOSIS — D509 Iron deficiency anemia, unspecified: Secondary | ICD-10-CM | POA: Diagnosis not present

## 2012-11-08 DIAGNOSIS — Z992 Dependence on renal dialysis: Secondary | ICD-10-CM | POA: Diagnosis not present

## 2012-11-10 DIAGNOSIS — N186 End stage renal disease: Secondary | ICD-10-CM | POA: Diagnosis not present

## 2012-11-10 DIAGNOSIS — Z992 Dependence on renal dialysis: Secondary | ICD-10-CM | POA: Diagnosis not present

## 2012-11-10 DIAGNOSIS — D509 Iron deficiency anemia, unspecified: Secondary | ICD-10-CM | POA: Diagnosis not present

## 2012-11-15 DIAGNOSIS — D509 Iron deficiency anemia, unspecified: Secondary | ICD-10-CM | POA: Diagnosis not present

## 2012-11-15 DIAGNOSIS — N186 End stage renal disease: Secondary | ICD-10-CM | POA: Diagnosis not present

## 2012-11-15 DIAGNOSIS — Z992 Dependence on renal dialysis: Secondary | ICD-10-CM | POA: Diagnosis not present

## 2012-11-17 DIAGNOSIS — Z992 Dependence on renal dialysis: Secondary | ICD-10-CM | POA: Diagnosis not present

## 2012-11-17 DIAGNOSIS — N186 End stage renal disease: Secondary | ICD-10-CM | POA: Diagnosis not present

## 2012-11-17 DIAGNOSIS — D509 Iron deficiency anemia, unspecified: Secondary | ICD-10-CM | POA: Diagnosis not present

## 2012-11-19 DIAGNOSIS — N186 End stage renal disease: Secondary | ICD-10-CM | POA: Diagnosis not present

## 2012-11-19 DIAGNOSIS — Z992 Dependence on renal dialysis: Secondary | ICD-10-CM | POA: Diagnosis not present

## 2012-11-19 DIAGNOSIS — D509 Iron deficiency anemia, unspecified: Secondary | ICD-10-CM | POA: Diagnosis not present

## 2012-11-22 DIAGNOSIS — Z992 Dependence on renal dialysis: Secondary | ICD-10-CM | POA: Diagnosis not present

## 2012-11-22 DIAGNOSIS — D509 Iron deficiency anemia, unspecified: Secondary | ICD-10-CM | POA: Diagnosis not present

## 2012-11-22 DIAGNOSIS — N186 End stage renal disease: Secondary | ICD-10-CM | POA: Diagnosis not present

## 2012-11-24 DIAGNOSIS — N186 End stage renal disease: Secondary | ICD-10-CM | POA: Diagnosis not present

## 2012-11-24 DIAGNOSIS — Z992 Dependence on renal dialysis: Secondary | ICD-10-CM | POA: Diagnosis not present

## 2012-11-24 DIAGNOSIS — D509 Iron deficiency anemia, unspecified: Secondary | ICD-10-CM | POA: Diagnosis not present

## 2012-11-26 DIAGNOSIS — N186 End stage renal disease: Secondary | ICD-10-CM | POA: Diagnosis not present

## 2012-11-26 DIAGNOSIS — D509 Iron deficiency anemia, unspecified: Secondary | ICD-10-CM | POA: Diagnosis not present

## 2012-11-26 DIAGNOSIS — Z992 Dependence on renal dialysis: Secondary | ICD-10-CM | POA: Diagnosis not present

## 2012-11-29 DIAGNOSIS — N186 End stage renal disease: Secondary | ICD-10-CM | POA: Diagnosis not present

## 2012-12-01 DIAGNOSIS — N186 End stage renal disease: Secondary | ICD-10-CM | POA: Diagnosis not present

## 2012-12-03 DIAGNOSIS — N186 End stage renal disease: Secondary | ICD-10-CM | POA: Diagnosis not present

## 2012-12-06 DIAGNOSIS — N186 End stage renal disease: Secondary | ICD-10-CM | POA: Diagnosis not present

## 2012-12-08 DIAGNOSIS — N186 End stage renal disease: Secondary | ICD-10-CM | POA: Diagnosis not present

## 2013-01-05 DIAGNOSIS — N186 End stage renal disease: Secondary | ICD-10-CM | POA: Diagnosis not present

## 2013-01-07 DIAGNOSIS — N186 End stage renal disease: Secondary | ICD-10-CM | POA: Diagnosis not present

## 2013-02-05 DIAGNOSIS — N186 End stage renal disease: Secondary | ICD-10-CM | POA: Diagnosis not present

## 2013-02-07 DIAGNOSIS — N186 End stage renal disease: Secondary | ICD-10-CM | POA: Diagnosis not present

## 2013-02-07 DIAGNOSIS — E209 Hypoparathyroidism, unspecified: Secondary | ICD-10-CM | POA: Diagnosis not present

## 2013-02-07 DIAGNOSIS — N2581 Secondary hyperparathyroidism of renal origin: Secondary | ICD-10-CM | POA: Diagnosis not present

## 2013-03-07 DIAGNOSIS — N186 End stage renal disease: Secondary | ICD-10-CM | POA: Diagnosis not present

## 2013-03-08 DIAGNOSIS — I871 Compression of vein: Secondary | ICD-10-CM | POA: Diagnosis not present

## 2013-03-08 DIAGNOSIS — T82898A Other specified complication of vascular prosthetic devices, implants and grafts, initial encounter: Secondary | ICD-10-CM | POA: Diagnosis not present

## 2013-03-08 DIAGNOSIS — N186 End stage renal disease: Secondary | ICD-10-CM | POA: Diagnosis not present

## 2013-03-09 DIAGNOSIS — N186 End stage renal disease: Secondary | ICD-10-CM | POA: Diagnosis not present

## 2013-04-07 DIAGNOSIS — N186 End stage renal disease: Secondary | ICD-10-CM | POA: Diagnosis not present

## 2013-04-08 DIAGNOSIS — D631 Anemia in chronic kidney disease: Secondary | ICD-10-CM | POA: Diagnosis not present

## 2013-04-08 DIAGNOSIS — N2581 Secondary hyperparathyroidism of renal origin: Secondary | ICD-10-CM | POA: Diagnosis not present

## 2013-04-08 DIAGNOSIS — N186 End stage renal disease: Secondary | ICD-10-CM | POA: Diagnosis not present

## 2013-04-08 DIAGNOSIS — N039 Chronic nephritic syndrome with unspecified morphologic changes: Secondary | ICD-10-CM | POA: Diagnosis not present

## 2013-04-11 DIAGNOSIS — N2581 Secondary hyperparathyroidism of renal origin: Secondary | ICD-10-CM | POA: Diagnosis not present

## 2013-04-11 DIAGNOSIS — N039 Chronic nephritic syndrome with unspecified morphologic changes: Secondary | ICD-10-CM | POA: Diagnosis not present

## 2013-04-11 DIAGNOSIS — N186 End stage renal disease: Secondary | ICD-10-CM | POA: Diagnosis not present

## 2013-04-13 DIAGNOSIS — N2581 Secondary hyperparathyroidism of renal origin: Secondary | ICD-10-CM | POA: Diagnosis not present

## 2013-04-13 DIAGNOSIS — N039 Chronic nephritic syndrome with unspecified morphologic changes: Secondary | ICD-10-CM | POA: Diagnosis not present

## 2013-04-13 DIAGNOSIS — N186 End stage renal disease: Secondary | ICD-10-CM | POA: Diagnosis not present

## 2013-04-15 DIAGNOSIS — N186 End stage renal disease: Secondary | ICD-10-CM | POA: Diagnosis not present

## 2013-04-15 DIAGNOSIS — D631 Anemia in chronic kidney disease: Secondary | ICD-10-CM | POA: Diagnosis not present

## 2013-04-15 DIAGNOSIS — N2581 Secondary hyperparathyroidism of renal origin: Secondary | ICD-10-CM | POA: Diagnosis not present

## 2013-04-18 DIAGNOSIS — D631 Anemia in chronic kidney disease: Secondary | ICD-10-CM | POA: Diagnosis not present

## 2013-04-18 DIAGNOSIS — N2581 Secondary hyperparathyroidism of renal origin: Secondary | ICD-10-CM | POA: Diagnosis not present

## 2013-04-18 DIAGNOSIS — N186 End stage renal disease: Secondary | ICD-10-CM | POA: Diagnosis not present

## 2013-04-20 DIAGNOSIS — N2581 Secondary hyperparathyroidism of renal origin: Secondary | ICD-10-CM | POA: Diagnosis not present

## 2013-04-20 DIAGNOSIS — D631 Anemia in chronic kidney disease: Secondary | ICD-10-CM | POA: Diagnosis not present

## 2013-04-20 DIAGNOSIS — N186 End stage renal disease: Secondary | ICD-10-CM | POA: Diagnosis not present

## 2013-04-21 ENCOUNTER — Ambulatory Visit
Admission: RE | Admit: 2013-04-21 | Discharge: 2013-04-21 | Disposition: A | Discharge status: Home / Self Care | Payer: Medicare Other | Source: Ambulatory Visit | Attending: Nephrology | Admitting: Nephrology

## 2013-04-21 ENCOUNTER — Other Ambulatory Visit: Payer: Self-pay | Admitting: Nephrology

## 2013-04-21 DIAGNOSIS — R062 Wheezing: Secondary | ICD-10-CM

## 2013-04-21 DIAGNOSIS — R05 Cough: Secondary | ICD-10-CM | POA: Diagnosis not present

## 2013-04-21 DIAGNOSIS — R0601 Orthopnea: Secondary | ICD-10-CM

## 2013-04-22 DIAGNOSIS — N2581 Secondary hyperparathyroidism of renal origin: Secondary | ICD-10-CM | POA: Diagnosis not present

## 2013-04-22 DIAGNOSIS — N186 End stage renal disease: Secondary | ICD-10-CM | POA: Diagnosis not present

## 2013-04-22 DIAGNOSIS — N039 Chronic nephritic syndrome with unspecified morphologic changes: Secondary | ICD-10-CM | POA: Diagnosis not present

## 2013-04-25 DIAGNOSIS — N2581 Secondary hyperparathyroidism of renal origin: Secondary | ICD-10-CM | POA: Diagnosis not present

## 2013-04-25 DIAGNOSIS — D631 Anemia in chronic kidney disease: Secondary | ICD-10-CM | POA: Diagnosis not present

## 2013-04-25 DIAGNOSIS — N186 End stage renal disease: Secondary | ICD-10-CM | POA: Diagnosis not present

## 2013-04-27 ENCOUNTER — Encounter (HOSPITAL_COMMUNITY): Payer: Self-pay | Admitting: Emergency Medicine

## 2013-04-27 ENCOUNTER — Inpatient Hospital Stay (HOSPITAL_COMMUNITY): Payer: Medicare Other

## 2013-04-27 ENCOUNTER — Inpatient Hospital Stay (HOSPITAL_COMMUNITY)
Admission: EM | Admit: 2013-04-27 | Discharge: 2013-04-30 | DRG: 308 | Disposition: A | Discharge status: Home / Self Care | Payer: Medicare Other | Attending: Internal Medicine | Admitting: Internal Medicine

## 2013-04-27 DIAGNOSIS — I369 Nonrheumatic tricuspid valve disorder, unspecified: Secondary | ICD-10-CM | POA: Diagnosis not present

## 2013-04-27 DIAGNOSIS — D631 Anemia in chronic kidney disease: Secondary | ICD-10-CM | POA: Diagnosis not present

## 2013-04-27 DIAGNOSIS — R55 Syncope and collapse: Secondary | ICD-10-CM | POA: Diagnosis not present

## 2013-04-27 DIAGNOSIS — G4733 Obstructive sleep apnea (adult) (pediatric): Secondary | ICD-10-CM | POA: Diagnosis not present

## 2013-04-27 DIAGNOSIS — N189 Chronic kidney disease, unspecified: Secondary | ICD-10-CM | POA: Diagnosis not present

## 2013-04-27 DIAGNOSIS — I120 Hypertensive chronic kidney disease with stage 5 chronic kidney disease or end stage renal disease: Secondary | ICD-10-CM | POA: Diagnosis not present

## 2013-04-27 DIAGNOSIS — D649 Anemia, unspecified: Secondary | ICD-10-CM | POA: Diagnosis not present

## 2013-04-27 DIAGNOSIS — Z992 Dependence on renal dialysis: Secondary | ICD-10-CM | POA: Diagnosis not present

## 2013-04-27 DIAGNOSIS — I4891 Unspecified atrial fibrillation: Principal | ICD-10-CM | POA: Diagnosis present

## 2013-04-27 DIAGNOSIS — I129 Hypertensive chronic kidney disease with stage 1 through stage 4 chronic kidney disease, or unspecified chronic kidney disease: Secondary | ICD-10-CM | POA: Diagnosis not present

## 2013-04-27 DIAGNOSIS — I4892 Unspecified atrial flutter: Secondary | ICD-10-CM | POA: Diagnosis present

## 2013-04-27 DIAGNOSIS — R0989 Other specified symptoms and signs involving the circulatory and respiratory systems: Secondary | ICD-10-CM | POA: Diagnosis not present

## 2013-04-27 DIAGNOSIS — N179 Acute kidney failure, unspecified: Secondary | ICD-10-CM

## 2013-04-27 DIAGNOSIS — D509 Iron deficiency anemia, unspecified: Secondary | ICD-10-CM | POA: Diagnosis present

## 2013-04-27 DIAGNOSIS — Z87891 Personal history of nicotine dependence: Secondary | ICD-10-CM | POA: Diagnosis not present

## 2013-04-27 DIAGNOSIS — N186 End stage renal disease: Secondary | ICD-10-CM | POA: Diagnosis present

## 2013-04-27 DIAGNOSIS — N2581 Secondary hyperparathyroidism of renal origin: Secondary | ICD-10-CM | POA: Diagnosis not present

## 2013-04-27 DIAGNOSIS — I10 Essential (primary) hypertension: Secondary | ICD-10-CM | POA: Diagnosis present

## 2013-04-27 HISTORY — DX: Unspecified atrial flutter: I48.92

## 2013-04-27 LAB — CBC
HCT: 27 % — ABNORMAL LOW (ref 39.0–52.0)
Hemoglobin: 9.3 g/dL — ABNORMAL LOW (ref 13.0–17.0)
MCH: 30.4 pg (ref 26.0–34.0)
MCHC: 34.4 g/dL (ref 30.0–36.0)
MCV: 88.2 fL (ref 78.0–100.0)
RDW: 14.6 % (ref 11.5–15.5)

## 2013-04-27 LAB — POCT I-STAT TROPONIN I

## 2013-04-27 LAB — BASIC METABOLIC PANEL
BUN: 52 mg/dL — ABNORMAL HIGH (ref 6–23)
Creatinine, Ser: 11.42 mg/dL — ABNORMAL HIGH (ref 0.50–1.35)
GFR calc Af Amer: 5 mL/min — ABNORMAL LOW (ref >90)
GFR calc non Af Amer: 4 mL/min — ABNORMAL LOW (ref >90)
Glucose, Bld: 89 mg/dL (ref 70–99)

## 2013-04-27 MED ORDER — SEVELAMER CARBONATE 800 MG PO TABS
800.0000 mg | ORAL_TABLET | Freq: Three times a day (TID) | ORAL | Status: DC
  Filled 2013-04-27 (×2): qty 1

## 2013-04-27 MED ORDER — PENTAFLUOROPROP-TETRAFLUOROETH EX AERO: 1.0000 "application " | INHALATION_SPRAY | CUTANEOUS | Status: DC | PRN

## 2013-04-27 MED ORDER — DARBEPOETIN ALFA-POLYSORBATE 25 MCG/0.42ML IJ SOLN
12.5000 ug | INTRAMUSCULAR | Status: DC
  Administered 2013-04-27: 12.5 ug via INTRAVENOUS
  Filled 2013-04-27: qty 0.42

## 2013-04-27 MED ORDER — SODIUM CHLORIDE 0.9 % IJ SOLN: 3.0000 mL | Freq: Two times a day (BID) | INTRAMUSCULAR | Status: DC

## 2013-04-27 MED ORDER — ARGININE 1000 MG PO TABS: 2000.0000 mg | ORAL_TABLET | Freq: Every day | ORAL | Status: DC

## 2013-04-27 MED ORDER — SODIUM CHLORIDE 0.9 % IV SOLN: 100.0000 mL | INTRAVENOUS | Status: DC | PRN

## 2013-04-27 MED ORDER — LABETALOL HCL 200 MG PO TABS
200.0000 mg | ORAL_TABLET | Freq: Two times a day (BID) | ORAL | Status: DC
  Administered 2013-04-27 – 2013-04-30 (×6): 200 mg via ORAL
  Filled 2013-04-27 (×7): qty 1

## 2013-04-27 MED ORDER — HEPARIN SODIUM (PORCINE) 1000 UNIT/ML DIALYSIS
1000.0000 [IU] | INTRAMUSCULAR | Status: DC | PRN
  Filled 2013-04-27: qty 1

## 2013-04-27 MED ORDER — HEPARIN SODIUM (PORCINE) 1000 UNIT/ML DIALYSIS
20.0000 [IU]/kg | Freq: Once | INTRAMUSCULAR | Status: AC
  Administered 2013-04-29: 2200 [IU] via INTRAVENOUS_CENTRAL
  Filled 2013-04-27: qty 3

## 2013-04-27 MED ORDER — SEVELAMER CARBONATE 800 MG PO TABS
800.0000 mg | ORAL_TABLET | Freq: Once | ORAL | Status: DC
  Filled 2013-04-27: qty 1

## 2013-04-27 MED ORDER — ONDANSETRON HCL 4 MG/2ML IJ SOLN: 4.0000 mg | Freq: Four times a day (QID) | INTRAMUSCULAR | Status: DC | PRN

## 2013-04-27 MED ORDER — LIDOCAINE HCL (PF) 1 % IJ SOLN: 5.0000 mL | INTRAMUSCULAR | Status: DC | PRN

## 2013-04-27 MED ORDER — ONDANSETRON HCL 4 MG PO TABS: 4.0000 mg | ORAL_TABLET | Freq: Four times a day (QID) | ORAL | Status: DC | PRN

## 2013-04-27 MED ORDER — LIDOCAINE-PRILOCAINE 2.5-2.5 % EX CREA
1.0000 | TOPICAL_CREAM | CUTANEOUS | Status: DC | PRN
Start: 2013-04-27 — End: 2013-04-29
  Filled 2013-04-27: qty 5

## 2013-04-27 MED ORDER — ALTEPLASE 2 MG IJ SOLR
2.0000 mg | Freq: Once | INTRAMUSCULAR | Status: AC | PRN
  Filled 2013-04-27: qty 2

## 2013-04-27 MED ORDER — SODIUM CHLORIDE 0.9 % IJ SOLN
3.0000 mL | Freq: Two times a day (BID) | INTRAMUSCULAR | Status: DC
  Administered 2013-04-28 – 2013-04-29 (×4): 3 mL via INTRAVENOUS

## 2013-04-27 MED ORDER — SODIUM CHLORIDE 0.9 % IJ SOLN: 3.0000 mL | INTRAMUSCULAR | Status: DC | PRN

## 2013-04-27 MED ORDER — NEPRO/CARBSTEADY PO LIQD
237.0000 mL | ORAL | Status: DC | PRN
  Filled 2013-04-27: qty 237

## 2013-04-27 MED ORDER — SODIUM CHLORIDE 0.9 % IV SOLN: 250.0000 mL | INTRAVENOUS | Status: DC | PRN

## 2013-04-27 NOTE — Consult Note (Signed)
Indication for Consultation:  Management of ESRD/hemodialysis; anemia, hypertension/volume and secondary hyperparathyroidism  HPI: Trevor [name] is a 62 y.o. male who transferred here from [facility] ED. He receieves HD MWF at [facility]. He was walking this AM and states he had a syncopal episode and went to [facility] ED. States he woke up this AM feeling fine but when he was walking down the hall started coughing and he passed out. He denies any chest pain, sob, palpitation, dizzy, lightheadedness prior to passing out. He states he experienced LOC because the next thing he remembered was his wife asking his if he was ok. He denies hitting his head at the time. He states in the last week he has been feeling fine but has had some episodes of bleeding from his fistula. He reports having fevers last Monday and this Monday post HD that he says are the result of his hgb dropping from the blood loss. He also reports that he has had a cough and some sob x 1 week also related to the low hgb (8/13 out pt hgb was 10.1, now 9.3). He also reports signing off HD early bc we are pulling to much fluid and he gets 'freezing cold', denies any cramping or low BPs during tx- EDW was recently increased by 1kg. EKG in [facility] ED revealed new afib. Head CT and chest xray negative. Since the epidose this AM he has felt fine, he said having O2 on helps his breathing and cough and he feels like he is in a 'normal state of health.'  He is currently on HD.  Past Medical History  Diagnosis Date  . Anemia   . CRF (chronic renal failure)     Stage 4  . AVF (arteriovenous fistula)     Left  . Secondary hyperparathyroidism   . Hypovitaminosis D   . Hypertensive urgency     H/o   Past Surgical History  Procedure Laterality Date  . Left avf  12/14/07    Dr. Charlean Sanfilippo  . Right avf    . Left knee arthroscopy     Family History  Problem Relation Age of Onset  . Hypertension Father   . Kidney disease Father   . Allergies Father    Social History:   reports that he has quit smoking. He has never used smokeless tobacco. He reports that  drinks alcohol. He reports that he does not use illicit drugs. Allergies  Allergen Reactions  . Oxycodone-Acetaminophen     REACTION: nausea  . Penicillins Other (See Comments)    Reaction to PCN unknown   Prior to Admission medications   Medication Sig Start Date End Date Taking? Authorizing Provider  amLODipine (NORVASC) 10 MG tablet Take 10 mg by mouth daily.     Yes Historical Provider, MD  Arginine 1000 MG TABS Take 2,000 mg by mouth daily.   Yes Historical Provider, MD  labetalol (TRANDATE) 200 MG tablet Take 200 mg by mouth 2 (two) times daily.     Yes Historical Provider, MD  sevelamer carbonate (RENVELA) 800 MG tablet Take 800 mg by mouth 3 (three) times daily with meals.   Yes Historical Provider, MD   No current facility-administered medications for this encounter.   Current Outpatient Prescriptions  Medication Sig Dispense Refill  . amLODipine (NORVASC) 10 MG tablet Take 10 mg by mouth daily.        . Arginine 1000 MG TABS Take 2,000 mg by mouth daily.      Marland Kitchen labetalol (  TRANDATE) 200 MG tablet Take 200 mg by mouth 2 (two) times daily.        . sevelamer carbonate (RENVELA) 800 MG tablet Take 800 mg by mouth 3 (three) times daily with meals.       Labs: Basic Metabolic Panel:  Recent Labs Lab 04/27/13 1049  NA 133*  K 4.2  CL 92*  CO2 28  GLUCOSE 89  BUN 52*  CREATININE 11.42*  CALCIUM 10.0   Liver Function Tests: No results found for this basename: AST, ALT, ALKPHOS, BILITOT, PROT, ALBUMIN,  in the last 168 hours No results found for this basename: LIPASE, AMYLASE,  in the last 168 hours No results found for this basename: AMMONIA,  in the last 168 hours CBC:  Recent Labs Lab 04/27/13 1049  WBC 3.1*  HGB 9.3*  HCT 27.0*  MCV 88.2  PLT 158   Cardiac Enzymes: No results found for this basename: CKTOTAL, CKMB, CKMBINDEX, TROPONINI,  in the last 168  hours CBG:  Recent Labs Lab 04/27/13 1116  GLUCAP 86   Iron Studies: No results found for this basename: IRON, TIBC, TRANSFERRIN, FERRITIN,  in the last 72 hours Studies/Results: Dg Chest 2 View  04/27/2013   *RADIOLOGY REPORT*  Clinical Data: Cough, history of MI, hypertension  CHEST - 2 VIEW  Comparison: 04/21/2013  Findings: Cardiomegaly again noted.  Again noted significant central vascular congestion.  No acute infiltrate or pleural effusion.  No pulmonary edema.  Stable right paratracheal soft tissue prominence.  IMPRESSION: No active disease.  Cardiomegaly. Central vascular congestion again noted.  No significant change   Original Report Authenticated By: Natasha Mead, M.D.   Ct Head Wo Contrast  04/27/2013   *RADIOLOGY REPORT*  Clinical Data: Syncope, atrial fibrillation  CT HEAD WITHOUT CONTRAST  Technique:  Contiguous axial images were obtained from the base of the skull through the vertex without contrast.  Comparison: None.  Findings: There is nodular mucosal thickening posterior aspect of the left maxillary sinus measures about 2.9 cm probable mucous retention cyst.  The mastoid air cells are unremarkable.  No intracranial hemorrhage, mass effect or midline shift.  No hydrocephalus.  No acute infarction.  The gray and white matter differentiation is preserved.  No mass lesion is noted on this unenhanced scan.  IMPRESSION: No acute intracranial abnormality.  Probable mucous retention cyst left maxillary sinus measures 2.9 cm.   Original Report Authenticated By: Natasha Mead, M.D.    ROS:  Review of Systems: [id number] fever without chills last Monday of 102.7 and this Monday 101.9- both at home post HD.  Denies fattigue, weakness, malaise, weight loss,  HEENT: No visual complaints CV: Reports 'passing out and LOC' Denies chest pain, angina, palpitations, orthopnea, PND, peripheral edema,  Resp: Reports sob x1 week, per pt it is result of dec hgb.States breathing is much improved on 2L  O2. Reports nonproductive cough x1 week  GI: Denies n/v/d or abd pain. Reports good appetite. Denied blood in stool GU : Denies urinary burning, blood in urine, urinary frequency, MS: Denies joint pain, limitation of movement, and swelling, stiffness, low back pain, extremity pain. Denies muscle weakness, cramps Derm: Denies rash, itching, dry skin, hives, moles, warts, or unhealing ulcers.  Psych: Denies anxiety and confusion. Heme: Reports occasional bleeding from fistula Neuro: No headache.  No diplopia. No paresthesias.  No weakness.   Physical Exam: Filed Vitals:   04/27/13 1315 04/27/13 1330 04/27/13 1345 04/27/13 1530  BP:    127/76  Pulse: 68  61 72 41  Temp:      TempSrc:      Resp: 23 20 22 23   SpO2: 96% 100% 97% 90%     General: Well developed, well nourished, in no acute distress. Looks comfortable Head: Normocephalic, atraumatic,  Neck: Supple. JVD not elevated. No carotid bruits Lungs: Clear bilaterally but dim bases without wheezes, rales, or rhonchi. Breathing is unlabored. Dry cough Heart: RRR, No murmurs, rubs, or gallops appreciated. Abdomen: Soft, non-tender, non-distended + BS x4 M-S:  Strength and tone appear normal for age. Lower extremities:without edema or ischemic changes, no open wounds  Neuro: Alert and oriented X 3. Moves all extremities spontaneously. Psych:  Responds to questions appropriately with a normal affect. Dialysis Access:RUA AVF- on HD now  Dialysis Orders: MWF @ [facility]. 4 hr 30   104.5 Kg   2K/2.25Ca+  200    7000u Hep  400 BFR 800 DFR R UA AVF Profile 2    [name]      Epogen 1200Units IV/HD     Assessment/Plan: 1. Syncope- EKG new aflutter/afib, Head CT-no acute intracranial abnormality. Chest xray- No active disease. Cardiomegaly. Central vascular congestion again  Noted.  Troponin 0.03. CBG 86. On 2 L o2 now 1. Afib- new on EKG. Labetalol for rate control, Tele.  2.  ESRD -  MWF @ [facility]   K+ 4.2  Frequent sign offs- HD  now 3.  Hypertension/volume  - BP 136/75, no edema, no volume excess. 4.  Anemia  - hgb 9.3, on epo 1200 u Q HD. Monitor for bleeding 5.  Metabolic bone disease -  Ca+ 10, phos 3.5 (7/23) Renvela with meals 6.  Nutrition - Alb 4.3- reports good appetite 7. Fistula bleeding- tight heparin for now, discourage pt to determine sites for being stuck- not using buttonhole. Recent intervention on AVF 03/08/13  Jetty Duhamel, NP [facility] [phone] 04/27/2013, 4:03 PM   Patient seen and examined, agree with above note with above modifications. Please see note dated 8/20 for addendum to this note Annie Sable, MD 04/28/2013

## 2013-04-27 NOTE — ED Notes (Signed)
Pt is a dialysis pt.  States he passed out this morning.  Denies hitting head.  Last dialysis was Monday.  Was supposed to go today but was unable due to passing out.

## 2013-04-27 NOTE — ED Notes (Signed)
Pt refusing to be stuck for IV start and Troponin q 6hr. MD notified.

## 2013-04-27 NOTE — Progress Notes (Signed)
Pt arrived to floor from HD in no apparent distress. Pt VSS. Pt oriented to floor and room. Pt given food. Baron Hamper, RN 04/27/2013

## 2013-04-27 NOTE — Progress Notes (Signed)
Addendum to note by Jetty Duhamel NP  Patient seen and examined, agree with above note with above modifications.  62 year old BM with ESRD- MWF at [facility].  He has been having issues with bleeding after HD from AVF- pt thought due to his buttonholes so is now just doing sharp sticks, there is also a possibility of too much heparin (7000 units per treatment) he is s/p fistulogram in July 2014.  Anyway, his hgb is down for him and he states that when that happens it makes him cough.  He felt OK today until he coughed one time and then had syncopal episode and came to ER to be evald- he looks fine right now, BP is good, he was discovered to have A fib so will be worked up for that.  We will do his regular HD here today, with his new EDW, sharp needles and decreased heparin and will follow him while he is here for his dialysis needs Annie Sable, MD 04/27/2013

## 2013-04-27 NOTE — ED Provider Notes (Signed)
CSN: [id number]     Arrival date & time 04/27/13  1028 History   First MD Initiated Contact with Patient 04/27/13 1107     Chief Complaint  Patient presents with  . Loss of Consciousness    HPI Patient presents to the emergency room after having a syncopal episode this morning. Patient states he was walking and suddenly had a syncopal episode. Patient does admit to feeling weak over the last several weeks. He has attributed this to his low hemoglobin. Over the last couple of weeks patient has had trouble with intermittent bleeding from his right arteriovenous fistula. This has occurred during dialysis. Patient has also had episodes in between dialysis where he will have pulsatile spontaneous bleeding from his AV fistula site.  Often, an episode of coughing will cough the clot that is lodged in open spontaneous bleeding. Patient usually can control it with pressure after a period of time the  patient's last dialysis was Monday. He did notice some bleeding in Monday and Tuesday but has not had any today. He denies any rectal bleeding. He has not had any marked assistance.  Patient denies any chest pain or shortness of breath. He does feel that rest and oxygen seemed to make him feel better.  He has mentioned these complaints to his house his doctors. They have checked his hemoglobin was 9.1 recently. He was started on epogen. He also had an outpatient chest x-ray that did not show any evidence of pneumonia this past Thursday. Past Medical History  Diagnosis Date  . Anemia   . CRF (chronic renal failure)     Stage 4  . AVF (arteriovenous fistula)     Left  . Secondary hyperparathyroidism   . Hypovitaminosis D   . Hypertensive urgency     H/o   Past Surgical History  Procedure Laterality Date  . Left avf  12/14/07    Dr. Charlean Sanfilippo  . Right avf    . Left knee arthroscopy     Family History  Problem Relation Age of Onset  . Hypertension Father   . Kidney disease Father   . Allergies Father     History  Substance Use Topics  . Smoking status: Former Games developer  . Smokeless tobacco: Not on file  . Alcohol Use: Yes     Comment: glass of wine    Review of Systems  All other systems reviewed and are negative.    Allergies  Oxycodone-acetaminophen and Penicillins  Home Medications   Current Outpatient Rx  Name  Route  Sig  Dispense  Refill  . amLODipine (NORVASC) 10 MG tablet   Oral   Take 10 mg by mouth daily.           . Arginine 1000 MG TABS   Oral   Take 2,000 mg by mouth daily.         Marland Kitchen labetalol (TRANDATE) 200 MG tablet   Oral   Take 200 mg by mouth 2 (two) times daily.           . sevelamer carbonate (RENVELA) 800 MG tablet   Oral   Take 800 mg by mouth 3 (three) times daily with meals.          BP 122/85  Pulse 70  Temp(Src) 98 F (36.7 C) (Oral)  Resp 18  SpO2 98% Physical Exam  Nursing note and vitals reviewed. Constitutional: He appears well-developed and well-nourished. No distress.  HENT:  Head: Normocephalic and atraumatic.  Right Ear: External  ear normal.  Left Ear: External ear normal.  Eyes: Conjunctivae are normal. Right eye exhibits no discharge. Left eye exhibits no discharge. No scleral icterus.  Neck: Neck supple. No tracheal deviation present.  Cardiovascular: Normal rate and intact distal pulses.  An irregularly irregular rhythm present.  Pulmonary/Chest: Effort normal and breath sounds normal. No stridor. No respiratory distress. He has no wheezes. He has no rales.  Abdominal: Soft. Bowel sounds are normal. He exhibits no distension. There is no tenderness. There is no rebound and no guarding.  Musculoskeletal: He exhibits no edema and no tenderness.  AV fistula  right upper extremity with thrill, no active bleeding  Neurological: He is alert. He has normal strength. No sensory deficit. Cranial nerve deficit:  no gross defecits noted. He exhibits normal muscle tone. He displays no seizure activity. Coordination normal.   Skin: Skin is warm and dry. No rash noted.  Psychiatric: He has a normal mood and affect.    ED Course  EKG Atrial flutter with variable conduction rate 69 Normal access, nonspecific repolarization abnormalities diffusely When compared to prior EKG the atrial flutter appears new Procedures (including critical care time)  Labs Reviewed  CBC - Abnormal; Notable for the following:    WBC 3.1 (*)    RBC 3.06 (*)    Hemoglobin 9.3 (*)    HCT 27.0 (*)    All other components within normal limits  BASIC METABOLIC PANEL - Abnormal; Notable for the following:    Sodium 133 (*)    Chloride 92 (*)    BUN 52 (*)    Creatinine, Ser 11.42 (*)    GFR calc non Af Amer 4 (*)    GFR calc Af Amer 5 (*)    All other components within normal limits  GLUCOSE, CAPILLARY  POCT I-STAT TROPONIN I   No results found. 1. Syncope   2. Chronic renal failure   3. Atrial flutter     MDM  The patient has remained stable in the emergency department. He's had no further syncopal episodes.  Patient's anemia appears to be stable. Patient states his most recent hemoglobin prior to today was 9.1. The patient's EKG does show atrial flutter/atrial fibrillation. Currently it is rate controlled. This may be a factor in his fatigue and possibly related to his syncopal event. We'll consult the medical service regarding admission for further testing and monitoring.  Celene Kras, MD 04/27/13 1323

## 2013-04-27 NOTE — H&P (Signed)
[facility] History and Physical  Trevor [name] ZOX:[id number] DOB: 14-Mar-1951 DOA: 04/27/2013  Referring physician: Dr Trevor [name] PCP: No primary provider on file.  Specialists: Dr Trevor [name].   Chief Complaint: Pass out.   HPI: Trevor [name] is a 62 y.o. male very pleasant with PMH significant for ESRD on dialysis M, W, F who presents after a syncope episode. He relates he was walking on the hallway, after a coughing episode, he pass out. He denies chest pain, dyspnea, palpitation prior to episode. He is feeling generalized weak, ever since his HB has been low. He report intermittent bleeding of AV fistula during dialysis. He has had a dry cough for last week. He had chest x ray done last Thursday,per patient chest x ray was negative for PNA. He had fever 2 weeks ago.  He denies focal weakness, slurred speech, headache, nausea, vomiting.   Review of Systems: Negative except as per HPI.   Past Medical History  Diagnosis Date  . Anemia   . CRF (chronic renal failure)     Stage 4  . AVF (arteriovenous fistula)     Left  . Secondary hyperparathyroidism   . Hypovitaminosis D   . Hypertensive urgency     H/o   Past Surgical History  Procedure Laterality Date  . Left avf  12/14/07    Dr. Charlean [name]  . Right avf    . Left knee arthroscopy     Social History:  reports that he has quit smoking. He has never used smokeless tobacco. He reports that  drinks alcohol. He reports that he does not use illicit drugs.  Denies smoking, he is a history Runner, broadcasting/film/video. He has 4 children. He is married.   Allergies  Allergen Reactions  . Oxycodone-Acetaminophen     REACTION: nausea  . Penicillins Other (See Comments)    Reaction to PCN unknown    Family History  Problem Relation Age of Onset  . Hypertension Father   . Kidney disease Father   . Allergies Father     Prior to Admission medications   Medication Sig Start Date End Date Taking? Authorizing Provider  amLODipine (NORVASC) 10 MG tablet  Take 10 mg by mouth daily.     Yes Historical Provider, MD  Arginine 1000 MG TABS Take 2,000 mg by mouth daily.   Yes Historical Provider, MD  labetalol (TRANDATE) 200 MG tablet Take 200 mg by mouth 2 (two) times daily.     Yes Historical Provider, MD  sevelamer carbonate (RENVELA) 800 MG tablet Take 800 mg by mouth 3 (three) times daily with meals.   Yes Historical Provider, MD   Physical Exam: Filed Vitals:   04/27/13 1345  BP:   Pulse: 72  Temp:   Resp: 22   General Appearance:    Alert, cooperative, no distress, appears stated age  Head:    Normocephalic, without obvious abnormality, atraumatic  Eyes:    PERRL, conjunctiva/corneas clear, EOM's intact,        Ears:    Normal TM's and external ear canals, both ears  Nose:   Nares normal, septum midline, mucosa normal, no drainage    or sinus tenderness  Throat:   Lips, mucosa, and tongue normal; teeth and gums normal  Neck:   Supple, symmetrical, trachea midline, no adenopathy;       thyroid:  No enlargement/tenderness/nodules; no carotid   bruit or JVD  Back:     Symmetric, no curvature, ROM normal, no CVA tenderness  Lungs:  Clear to auscultation bilaterally, respirations unlabored  Chest wall:    No tenderness or deformity  Heart:    Regular rate and rhythm, S1 and S2 normal, no murmur, rub   or gallop  Abdomen:     Soft, non-tender, bowel sounds active all four quadrants,    no masses, no organomegaly        Extremities:   Extremities normal, atraumatic, no cyanosis or edema  Pulses:   2+ and symmetric all extremities  Skin:   Skin color, texture, turgor normal, no rashes or lesions  Lymph nodes:   Cervical, supraclavicular, and axillary nodes normal  Neurologic:   CNII-XII intact. Normal strength, sensation and reflexes      throughout   Labs on Admission:  Basic Metabolic Panel:  Recent Labs Lab 04/27/13 1049  NA 133*  K 4.2  CL 92*  CO2 28  GLUCOSE 89  BUN 52*  CREATININE 11.42*  CALCIUM 10.0   Liver  Function Tests: No results found for this basename: AST, ALT, ALKPHOS, BILITOT, PROT, ALBUMIN,  in the last 168 hours No results found for this basename: LIPASE, AMYLASE,  in the last 168 hours No results found for this basename: AMMONIA,  in the last 168 hours CBC:  Recent Labs Lab 04/27/13 1049  WBC 3.1*  HGB 9.3*  HCT 27.0*  MCV 88.2  PLT 158   Cardiac Enzymes: No results found for this basename: CKTOTAL, CKMB, CKMBINDEX, TROPONINI,  in the last 168 hours  BNP (last 3 results) No results found for this basename: PROBNP,  in the last 8760 hours CBG:  Recent Labs Lab 04/27/13 1116  GLUCAP 86    Radiological Exams on Admission: No results found.  EKG: Independently reviewed. A fib.   Assessment/Plan Active Problems:   ANEMIA, IRON DEFICIENCY   HYPERTENSION   Renal failure, chronic   Syncope  1-Syncope: differential orthostatic hypotension, vasovagal, vs arrhythmia. Will admit to telemetry, cycle cardiac enzymes. Hold Norvasc. Will order orthostatic vitals, check ECHO cardiogram. Check CT head.   2-HTN: hold Norvasc in case orthostatic. Continue with labetalol for rate controled with holder parameters for BP.   3-ESRD on dialysis; M, W, F. Renal consulted. Dr Trevor [name] aware of patient. He will need probably vascular evaluation for intermittent bleeding on fistula. Patient is due for dialysis today. Will transfer patient to [facility].   4-Afib;No prior history of A fib. Will continue with labetalol. Rate controlled. Monitor on telemetry. Cycle cardiac enzymes. Will need to discussed anticoagulation options after evaluation of fistula bleeding. Hold on anticoagulation in case any procedure is needed. Check ECHO. Guaiac stool.  5-Cough: Check chest x ray. Patient reported history of fever 2 weeks ago.  6-Anemia: combination of anemia of chronic diseases and intermittent bleeding from fistula. Check guaiac stool. Last Hb per records at 12, 2 years ago. Follow hb trend.     Code Status: Presume full code.  Family Communication: Care discussed with patient.  Disposition Plan: expect 2 to 3 days inpatient.   Time spent: 75 minutes.   [name] [facility] Pager [phone]  If 7PM-7AM, please contact night-coverage [email or web address] Password [facility] 04/27/2013, 2:24 PM

## 2013-04-28 DIAGNOSIS — N186 End stage renal disease: Secondary | ICD-10-CM | POA: Diagnosis not present

## 2013-04-28 DIAGNOSIS — I4892 Unspecified atrial flutter: Secondary | ICD-10-CM | POA: Diagnosis not present

## 2013-04-28 DIAGNOSIS — I10 Essential (primary) hypertension: Secondary | ICD-10-CM

## 2013-04-28 DIAGNOSIS — D631 Anemia in chronic kidney disease: Secondary | ICD-10-CM | POA: Diagnosis not present

## 2013-04-28 DIAGNOSIS — N189 Chronic kidney disease, unspecified: Secondary | ICD-10-CM | POA: Diagnosis not present

## 2013-04-28 DIAGNOSIS — R55 Syncope and collapse: Secondary | ICD-10-CM | POA: Diagnosis not present

## 2013-04-28 DIAGNOSIS — G4733 Obstructive sleep apnea (adult) (pediatric): Secondary | ICD-10-CM | POA: Diagnosis not present

## 2013-04-28 DIAGNOSIS — I120 Hypertensive chronic kidney disease with stage 5 chronic kidney disease or end stage renal disease: Secondary | ICD-10-CM | POA: Diagnosis not present

## 2013-04-28 DIAGNOSIS — N2581 Secondary hyperparathyroidism of renal origin: Secondary | ICD-10-CM | POA: Diagnosis not present

## 2013-04-28 LAB — BASIC METABOLIC PANEL
BUN: 25 mg/dL — ABNORMAL HIGH (ref 6–23)
Creatinine, Ser: 7.12 mg/dL — ABNORMAL HIGH (ref 0.50–1.35)
GFR calc Af Amer: 9 mL/min — ABNORMAL LOW (ref >90)
GFR calc non Af Amer: 7 mL/min — ABNORMAL LOW (ref >90)
Glucose, Bld: 81 mg/dL (ref 70–99)

## 2013-04-28 LAB — CBC
MCH: 29.5 pg (ref 26.0–34.0)
MCHC: 33.3 g/dL (ref 30.0–36.0)
RDW: 14.5 % (ref 11.5–15.5)

## 2013-04-28 MED ORDER — SEVELAMER CARBONATE 800 MG PO TABS
4000.0000 mg | ORAL_TABLET | ORAL | Status: DC
  Administered 2013-04-28 – 2013-04-29 (×3): 4000 mg via ORAL
  Filled 2013-04-28 (×7): qty 5

## 2013-04-28 MED ORDER — SEVELAMER CARBONATE 800 MG PO TABS
3200.0000 mg | ORAL_TABLET | Freq: Once | ORAL | Status: DC
  Filled 2013-04-28: qty 4

## 2013-04-28 MED ORDER — SEVELAMER CARBONATE 800 MG PO TABS
4000.0000 mg | ORAL_TABLET | Freq: Three times a day (TID) | ORAL | Status: DC
  Filled 2013-04-28: qty 5

## 2013-04-28 MED ORDER — SEVELAMER CARBONATE 800 MG PO TABS
4000.0000 mg | ORAL_TABLET | Freq: Three times a day (TID) | ORAL | Status: DC
  Administered 2013-04-28 – 2013-04-30 (×6): 4000 mg via ORAL
  Filled 2013-04-28 (×11): qty 5

## 2013-04-28 MED ORDER — SEVELAMER CARBONATE 800 MG PO TABS
4000.0000 mg | ORAL_TABLET | Freq: Once | ORAL | Status: AC
  Administered 2013-04-28: 4000 mg via ORAL
  Filled 2013-04-28: qty 5

## 2013-04-28 MED ORDER — DARBEPOETIN ALFA-POLYSORBATE 25 MCG/0.42ML IJ SOLN: 100.0000 ug | INTRAMUSCULAR | Status: DC

## 2013-04-28 NOTE — Progress Notes (Signed)
Pt requesting five 800mg  tablets of Renvela with meal instead of ordered 800mg  1 tablet of renvela. Kirtland Bouchard Schorr notified and told to page nephrologist. Nephrologist paged, waiting for response. Baron Hamper, RN

## 2013-04-28 NOTE — Progress Notes (Signed)
Utilization Review Completed.   Reyce Lubeck, RN, BSN Nurse Case Manager  [phone]  

## 2013-04-28 NOTE — Progress Notes (Signed)
Subjective:  Said HD went well yest, removed 1900- did not have excessive bleeding afterwards.  Threatened to leave AMA last night because his renvela dose was not right.  His main concern is of this cough Objective Vital signs in last 24 hours: Filed Vitals:   04/27/13 2030 04/27/13 2045 04/27/13 2134 04/28/13 0539  BP: 139/85 147/85 138/89 129/85  Pulse: 76 84 100 101  Temp:  97 F (36.1 C) 98.4 F (36.9 C) 98.2 F (36.8 C)  TempSrc:  Oral Oral Oral  Resp:  20 18 18   Height:   6' 1.5" (1.867 m)   Weight:  103.8 kg (228 lb 13.4 oz) 105.597 kg (232 lb 12.8 oz) 106.232 kg (234 lb 3.2 oz)  SpO2:   99% 100%   Weight change:   Intake/Output Summary (Last 24 hours) at 04/28/13 1610 Last data filed at 04/28/13 0900  Gross per 24 hour  Intake    480 ml  Output   1900 ml  Net  -1420 ml    Assessment/ Plan: Pt is a 62 y.o. yo male ESRD who was admitted on 04/27/2013 with syncope/ new onset Afib Assessment/Plan: 1. Syncope- unknown if related to new onset A fib vs orthostasis (orthostatics slightly positive on admit)- has been walking in room- no further episodes or issues.  Afib per primary team.  Not sure what the plan is for that although pt seems to be tolerating well, echo ordered.   2. ESRD - normally MWF at [facility] via AVF- plan for next HD tomorrow 3. Anemia-  hgb 9.1- was on low dose epo- needs to be increased- pt says ABL from AVF has caused 4. Secondary hyperparathyroidism- pt very compliant with renvela keeps phos in check- now on his regular home dose 5. HTN/volume- actually seems OK, recent EDW increase as OP- on low dose labetalol only 6. Cough- CXR negative- ?allergies vs bronchitis vs GERD? Does not need to be an inpatient issue although seems to be pts primary concern  Miamarie Moll A    Labs: Basic Metabolic Panel:  Recent Labs Lab 04/27/13 1049 04/28/13 0545  NA 133* 137  K 4.2 3.8  CL 92* 97  CO2 28 29  GLUCOSE 89 81  BUN 52* 25*  CREATININE 11.42*  7.12*  CALCIUM 10.0 9.6   Liver Function Tests: No results found for this basename: AST, ALT, ALKPHOS, BILITOT, PROT, ALBUMIN,  in the last 168 hours No results found for this basename: LIPASE, AMYLASE,  in the last 168 hours No results found for this basename: AMMONIA,  in the last 168 hours CBC:  Recent Labs Lab 04/27/13 1049 04/28/13 0545  WBC 3.1* 2.8*  HGB 9.3* 9.1*  HCT 27.0* 27.3*  MCV 88.2 88.6  PLT 158 171   Cardiac Enzymes: No results found for this basename: CKTOTAL, CKMB, CKMBINDEX, TROPONINI,  in the last 168 hours CBG:  Recent Labs Lab 04/27/13 1116  GLUCAP 86    Iron Studies: No results found for this basename: IRON, TIBC, TRANSFERRIN, FERRITIN,  in the last 72 hours Studies/Results: Dg Chest 2 View  04/27/2013   *RADIOLOGY REPORT*  Clinical Data: Cough, history of MI, hypertension  CHEST - 2 VIEW  Comparison: 04/21/2013  Findings: Cardiomegaly again noted.  Again noted significant central vascular congestion.  No acute infiltrate or pleural effusion.  No pulmonary edema.  Stable right paratracheal soft tissue prominence.  IMPRESSION: No active disease.  Cardiomegaly. Central vascular congestion again noted.  No significant change   Original Report  Authenticated By: Natasha Mead, M.D.   Ct Head Wo Contrast  04/27/2013   *RADIOLOGY REPORT*  Clinical Data: Syncope, atrial fibrillation  CT HEAD WITHOUT CONTRAST  Technique:  Contiguous axial images were obtained from the base of the skull through the vertex without contrast.  Comparison: None.  Findings: There is nodular mucosal thickening posterior aspect of the left maxillary sinus measures about 2.9 cm probable mucous retention cyst.  The mastoid air cells are unremarkable.  No intracranial hemorrhage, mass effect or midline shift.  No hydrocephalus.  No acute infarction.  The gray and white matter differentiation is preserved.  No mass lesion is noted on this unenhanced scan.  IMPRESSION: No acute intracranial  abnormality.  Probable mucous retention cyst left maxillary sinus measures 2.9 cm.   Original Report Authenticated By: Natasha Mead, M.D.   Medications: Infusions:    Scheduled Medications: . darbepoetin  12.5 mcg Intravenous Q7 days  . heparin  20 Units/kg Dialysis Once in dialysis  . labetalol  200 mg Oral BID  . sevelamer carbonate  4,000 mg Oral TID WC  . sodium chloride  3 mL Intravenous Q12H    have reviewed scheduled and prn medications.  Physical Exam: General: NAD, clothes on - reading the paper- did not cough while i was in room Heart: irreg Lungs: mostly clear Abdomen: soft, non tender Extremities: no edema Dialysis Access: AVF right arm    04/28/2013,9:26 AM  LOS: 1 day

## 2013-04-28 NOTE — Progress Notes (Signed)
PT refuses to wear CPAP at this time. Pt does not wear at home. Pt encouraged to call RT if pt changes mind. No distress noted.

## 2013-04-28 NOTE — Progress Notes (Signed)
Pt states that he will leave hospital tonight if he does not receive his prescribed Renvela, pt states his phosphorus goal is less than 3.5. K Schorr paged, awaiting response. [facility] doctor paged x2. Will continue to monitor. Baron Hamper, RN

## 2013-04-28 NOTE — Progress Notes (Signed)
Dr. Lowell Guitar from [facility] paged and new order for renvela 4000mg  with meals 3x/day. Ordered to give 1 dose tonight. Merdis Delay on call with [facility] notified Baron Hamper, RN 04/28/2013

## 2013-04-28 NOTE — Progress Notes (Signed)
[facility] PROGRESS NOTE  CAEDYN RAYGOZA [name]:[id number] DOB: 09-14-1950 DOA: 04/27/2013 PCP: No primary provider on file.  Assessment/Plan: 1. Chronic renal failure; after contacting Dr. Fayrene Fearing L. Deterding (nephrologist) Changed Renvela to 4000 mg by mouth q. a.c., q. snack per his instructions --- HD on M/W/F; patient's new base weight post HD is 232lbs    2. A flutter; stable, echocardiogram pending --Ensure all electrolytes are within normal limits for a renal patient on HD --- Pending on findings of echocardiogram would consider nuclear stress test vs       cardiac catheterization ---  Consult cardiology in the a.m.   3. HTN; currently controlled no change  4. OSA; consult respiratory for CPAP  5. Syncope; see #2  Code Status: Full Family Communication: Wife and daughter present for discussion of plan of care Desposition Plan:     Consultants:  Nephrology  Procedures:  Antibiotics:    HPI/Subjective: Trevor [name] is a 62 y.o. BM PMHx ESRD on dialysis M/W/,F, HTN, syncope, A. fib/flutter, OSA, who presents after a syncope episode. He relates he was walking on the hallway, after a coughing episode, he pass out. He denies chest pain, dyspnea, palpitation prior to episode. He is feeling generalized weak, ever since his HB has been low. He report intermittent bleeding of AV fistula during dialysis. He has had a dry cough for last week. He had chest x ray done last Thursday,per patient chest x ray was negative for PNA. He had fever 2 weeks ago.  He denies focal weakness, slurred speech, headache, nausea, vomiting. Today states when he coughs he feels extremely lightheaded and its shooting feelings from his head down for chest (presyncopal). Also states the dose of Renvela is incorrect(records show 3 times a day dosing, patient states also takes when necessary), appears patient's base weight post HD is 232lbs     Objective: Filed Vitals:   04/27/13 2134 04/28/13 0539  04/28/13 1012 04/28/13 1300  BP: 138/89 129/85 125/77 142/51  Pulse: 100 101 95 92  Temp: 98.4 F (36.9 C) 98.2 F (36.8 C) 98.4 F (36.9 C) 98 F (36.7 C)  TempSrc: Oral Oral Oral Oral  Resp: 18 18  18   Height: 6' 1.5" (1.867 m)     Weight: 105.597 kg (232 lb 12.8 oz) 106.232 kg (234 lb 3.2 oz)    SpO2: 99% 100% 95% 96%    Intake/Output Summary (Last 24 hours) at 04/28/13 1650 Last data filed at 04/28/13 1300  Gross per 24 hour  Intake    940 ml  Output   1900 ml  Net   -960 ml   Filed Weights   04/27/13 2045 04/27/13 2134 04/28/13 0539  Weight: 103.8 kg (228 lb 13.4 oz) 105.597 kg (232 lb 12.8 oz) 106.232 kg (234 lb 3.2 oz)    Exam:   General:  Alert, NAD  Cardiovascular: Regular rhythm and rate, negative murmurs rubs or gallops, DP/PT 2+ bilateral  Respiratory: Clear to auscultation bilateral  Abdomen: Soft nontender nondistended plus bowel sounds  Musculoskeletal: AVF present right brachial   Data Reviewed: Basic Metabolic Panel:  Recent Labs Lab 04/27/13 1049 04/28/13 0545  NA 133* 137  K 4.2 3.8  CL 92* 97  CO2 28 29  GLUCOSE 89 81  BUN 52* 25*  CREATININE 11.42* 7.12*  CALCIUM 10.0 9.6   Liver Function Tests: No results found for this basename: AST, ALT, ALKPHOS, BILITOT, PROT, ALBUMIN,  in the last 168 hours No results found for  this basename: LIPASE, AMYLASE,  in the last 168 hours No results found for this basename: AMMONIA,  in the last 168 hours CBC:  Recent Labs Lab 04/27/13 1049 04/28/13 0545  WBC 3.1* 2.8*  HGB 9.3* 9.1*  HCT 27.0* 27.3*  MCV 88.2 88.6  PLT 158 171   Cardiac Enzymes: No results found for this basename: CKTOTAL, CKMB, CKMBINDEX, TROPONINI,  in the last 168 hours BNP (last 3 results) No results found for this basename: PROBNP,  in the last 8760 hours CBG:  Recent Labs Lab 04/27/13 1116  GLUCAP 86    No results found for this or any previous visit (from the past 240 hour(s)).   Studies: Dg Chest 2  View  04/27/2013   *RADIOLOGY REPORT*  Clinical Data: Cough, history of MI, hypertension  CHEST - 2 VIEW  Comparison: 04/21/2013  Findings: Cardiomegaly again noted.  Again noted significant central vascular congestion.  No acute infiltrate or pleural effusion.  No pulmonary edema.  Stable right paratracheal soft tissue prominence.  IMPRESSION: No active disease.  Cardiomegaly. Central vascular congestion again noted.  No significant change   Original Report Authenticated By: Natasha Mead, M.D.   Ct Head Wo Contrast  04/27/2013   *RADIOLOGY REPORT*  Clinical Data: Syncope, atrial fibrillation  CT HEAD WITHOUT CONTRAST  Technique:  Contiguous axial images were obtained from the base of the skull through the vertex without contrast.  Comparison: None.  Findings: There is nodular mucosal thickening posterior aspect of the left maxillary sinus measures about 2.9 cm probable mucous retention cyst.  The mastoid air cells are unremarkable.  No intracranial hemorrhage, mass effect or midline shift.  No hydrocephalus.  No acute infarction.  The gray and white matter differentiation is preserved.  No mass lesion is noted on this unenhanced scan.  IMPRESSION: No acute intracranial abnormality.  Probable mucous retention cyst left maxillary sinus measures 2.9 cm.   Original Report Authenticated By: Natasha Mead, M.D.    Scheduled Meds: . [START ON 05/04/2013] darbepoetin  100 mcg Intravenous Q7 days  . heparin  20 Units/kg Dialysis Once in dialysis  . labetalol  200 mg Oral BID  . sevelamer carbonate  4,000 mg Oral TID WC  . sodium chloride  3 mL Intravenous Q12H   Continuous Infusions:   Principal Problem:   Syncope Active Problems:   ANEMIA, IRON DEFICIENCY   HYPERTENSION   Renal failure, chronic   Atrial flutter    Time spent: 45 minutes    Shalane Florendo, Roselind Messier  [facility] Pager [phone] If 7PM-7AM, please contact night-coverage at [email or web address], password [facility] 04/28/2013, 4:50 PM  LOS: 1 day

## 2013-04-28 NOTE — Progress Notes (Signed)
Pt alert and orientedx4. Tolerates ambulating to the bathroom. VSS. Denies any pain. Has good appetite. Will continue to monitor.

## 2013-04-29 ENCOUNTER — Ambulatory Visit: Payer: Medicare Other | Admitting: Cardiology

## 2013-04-29 DIAGNOSIS — I120 Hypertensive chronic kidney disease with stage 5 chronic kidney disease or end stage renal disease: Secondary | ICD-10-CM | POA: Diagnosis not present

## 2013-04-29 DIAGNOSIS — N189 Chronic kidney disease, unspecified: Secondary | ICD-10-CM | POA: Diagnosis not present

## 2013-04-29 DIAGNOSIS — I4892 Unspecified atrial flutter: Secondary | ICD-10-CM | POA: Diagnosis not present

## 2013-04-29 DIAGNOSIS — I369 Nonrheumatic tricuspid valve disorder, unspecified: Secondary | ICD-10-CM | POA: Diagnosis not present

## 2013-04-29 DIAGNOSIS — I4891 Unspecified atrial fibrillation: Secondary | ICD-10-CM | POA: Diagnosis not present

## 2013-04-29 DIAGNOSIS — R55 Syncope and collapse: Secondary | ICD-10-CM | POA: Diagnosis not present

## 2013-04-29 DIAGNOSIS — G4733 Obstructive sleep apnea (adult) (pediatric): Secondary | ICD-10-CM | POA: Diagnosis not present

## 2013-04-29 DIAGNOSIS — N2581 Secondary hyperparathyroidism of renal origin: Secondary | ICD-10-CM | POA: Diagnosis not present

## 2013-04-29 DIAGNOSIS — N186 End stage renal disease: Secondary | ICD-10-CM | POA: Diagnosis not present

## 2013-04-29 DIAGNOSIS — D631 Anemia in chronic kidney disease: Secondary | ICD-10-CM | POA: Diagnosis not present

## 2013-04-29 LAB — RENAL FUNCTION PANEL
BUN: 37 mg/dL — ABNORMAL HIGH (ref 6–23)
CO2: 27 mEq/L (ref 19–32)
GFR calc Af Amer: 6 mL/min — ABNORMAL LOW (ref >90)
Glucose, Bld: 84 mg/dL (ref 70–99)
Phosphorus: 3.9 mg/dL (ref 2.3–4.6)
Potassium: 4 mEq/L (ref 3.5–5.1)
Sodium: 136 mEq/L (ref 135–145)

## 2013-04-29 LAB — MAGNESIUM: Magnesium: 2.4 mg/dL (ref 1.5–2.5)

## 2013-04-29 LAB — CBC
HCT: 27.5 % — ABNORMAL LOW (ref 39.0–52.0)
Hemoglobin: 9.3 g/dL — ABNORMAL LOW (ref 13.0–17.0)
MCHC: 33.8 g/dL (ref 30.0–36.0)
RBC: 3.12 MIL/uL — ABNORMAL LOW (ref 4.22–5.81)

## 2013-04-29 LAB — COMPREHENSIVE METABOLIC PANEL
ALT: 16 U/L (ref 0–53)
AST: 16 U/L (ref 0–37)
Calcium: 9.6 mg/dL (ref 8.4–10.5)
Creatinine, Ser: 9.24 mg/dL — ABNORMAL HIGH (ref 0.50–1.35)
GFR calc Af Amer: 6 mL/min — ABNORMAL LOW (ref >90)
GFR calc non Af Amer: 5 mL/min — ABNORMAL LOW (ref >90)
Glucose, Bld: 82 mg/dL (ref 70–99)
Sodium: 137 mEq/L (ref 135–145)
Total Protein: 7.4 g/dL (ref 6.0–8.3)

## 2013-04-29 MED ORDER — HEPARIN SODIUM (PORCINE) 1000 UNIT/ML DIALYSIS: 20.0000 [IU]/kg | INTRAMUSCULAR | Status: DC | PRN

## 2013-04-29 NOTE — Consult Note (Signed)
Reason for Consult: Atrial fibrillation Referring Physician: Dr. Dan Europe is an 62 y.o. male.  HPI: [name] is a 62 yo man with OSA on CPAP, prior history of syncope, atrial flutter and ESRD on hemodialysis M/W/F with left arm AVF, hypertension with new onset atrial fibrillation. Cardiology consulted for potential need for anticoagulation. He's followed by Dr. Darrick [name] for his chronic renal failure, weight goal currently 232 lbs. On my exam, he's pleasant, doing well, tolerating dialysis and tells me he is using straight needles now so hopes that his bleeding/oozing problem from AVF will improve. He doesn't have infectious symptoms currently and we discussed the indications for anticoagulation for him given the stroke risks associated with atrial fibrillation. He is a bit hesitant and wants to think about it and readdress later today and/or tomorrow as he readies for discharge home.   Past Medical History  Diagnosis Date  . Anemia   . CRF (chronic renal failure)     Stage 4  . AVF (arteriovenous fistula)     Left  . Secondary hyperparathyroidism   . Hypovitaminosis D   . Hypertensive urgency     H/o    Past Surgical History  Procedure Laterality Date  . Left avf  12/14/07    Dr. Charlean [name]  . Right avf    . Left knee arthroscopy      Family History  Problem Relation Age of Onset  . Hypertension Father   . Kidney disease Father   . Allergies Father     Social History:  reports that he has quit smoking. He has never used smokeless tobacco. He reports that  drinks alcohol. He reports that he does not use illicit drugs.  Allergies:  Allergies  Allergen Reactions  . Oxycodone-Acetaminophen     REACTION: nausea  . Penicillins Other (See Comments)    Reaction to PCN unknown    Medications:  Current Facility-Administered Medications  Medication Dose Route Frequency Provider Last Rate Last Dose  . labetalol (NORMODYNE) tablet 200 mg  200 mg Oral BID Belkys A  Regalado, MD   200 mg at 04/29/13 2145  . ondansetron (ZOFRAN) tablet 4 mg  4 mg Oral Q6H PRN Belkys A Regalado, MD       Or  . ondansetron (ZOFRAN) injection 4 mg  4 mg Intravenous Q6H PRN Belkys A Regalado, MD      . sevelamer carbonate (RENVELA) tablet 4,000 mg  4,000 mg Oral TID WC Trevor Dallas, MD   4,000 mg at 04/29/13 1607  . sevelamer carbonate (RENVELA) tablet 4,000 mg  4,000 mg Oral With snacks Trevor Dallas, MD   4,000 mg at 04/29/13 1352  . sodium chloride 0.9 % injection 3 mL  3 mL Intravenous Q12H Belkys A Regalado, MD   3 mL at 04/29/13 2145    I have reviewed the patient's current medications. Prior to Admission:  Prescriptions prior to admission  Medication Sig Dispense Refill  . amLODipine (NORVASC) 10 MG tablet Take 10 mg by mouth daily.        . Arginine 1000 MG TABS Take 2,000 mg by mouth daily.      Marland Kitchen labetalol (TRANDATE) 200 MG tablet Take 200 mg by mouth 2 (two) times daily.        . sevelamer carbonate (RENVELA) 800 MG tablet Take 4,000 mg by mouth 3 (three) times daily with meals.       Scheduled: . labetalol  200 mg Oral BID  .  sevelamer carbonate  4,000 mg Oral TID WC  . sevelamer carbonate  4,000 mg Oral With snacks  . sodium chloride  3 mL Intravenous Q12H    Results for orders placed during the hospital encounter of 04/27/13 (from the past 48 hour(s))  BASIC METABOLIC PANEL     Status: Abnormal   Collection Time    04/28/13  5:45 AM      Result Value Range   Sodium 137  135 - 145 mEq/L   Potassium 3.8  3.5 - 5.1 mEq/L   Chloride 97  96 - 112 mEq/L   CO2 29  19 - 32 mEq/L   Glucose, Bld 81  70 - 99 mg/dL   BUN 25 (*) 6 - 23 mg/dL   Creatinine, Ser 1.61 (*) 0.50 - 1.35 mg/dL   Calcium 9.6  8.4 - 09.6 mg/dL   GFR calc non Af Amer 7 (*) >90 mL/min   GFR calc Af Amer 9 (*) >90 mL/min   Comment: (NOTE)     The eGFR has been calculated using the CKD EPI equation.     This calculation has not been validated in all clinical situations.     eGFR's  persistently <90 mL/min signify possible Chronic Kidney     Disease.  CBC     Status: Abnormal   Collection Time    04/28/13  5:45 AM      Result Value Range   WBC 2.8 (*) 4.0 - 10.5 K/uL   RBC 3.08 (*) 4.22 - 5.81 MIL/uL   Hemoglobin 9.1 (*) 13.0 - 17.0 g/dL   HCT 04.5 (*) 40.9 - 81.1 %   MCV 88.6  78.0 - 100.0 fL   MCH 29.5  26.0 - 34.0 pg   MCHC 33.3  30.0 - 36.0 g/dL   RDW 91.4  78.2 - 95.6 %   Platelets 171  150 - 400 K/uL  MAGNESIUM     Status: None   Collection Time    04/29/13  5:00 AM      Result Value Range   Magnesium 2.4  1.5 - 2.5 mg/dL  COMPREHENSIVE METABOLIC PANEL     Status: Abnormal   Collection Time    04/29/13  5:00 AM      Result Value Range   Sodium 137  135 - 145 mEq/L   Potassium 3.7  3.5 - 5.1 mEq/L   Chloride 97  96 - 112 mEq/L   CO2 29  19 - 32 mEq/L   Glucose, Bld 82  70 - 99 mg/dL   BUN 35 (*) 6 - 23 mg/dL   Creatinine, Ser 2.13 (*) 0.50 - 1.35 mg/dL   Calcium 9.6  8.4 - 08.6 mg/dL   Total Protein 7.4  6.0 - 8.3 g/dL   Albumin 3.2 (*) 3.5 - 5.2 g/dL   AST 16  0 - 37 U/L   ALT 16  0 - 53 U/L   Alkaline Phosphatase 129 (*) 39 - 117 U/L   Total Bilirubin 0.5  0.3 - 1.2 mg/dL   GFR calc non Af Amer 5 (*) >90 mL/min   GFR calc Af Amer 6 (*) >90 mL/min   Comment: (NOTE)     The eGFR has been calculated using the CKD EPI equation.     This calculation has not been validated in all clinical situations.     eGFR's persistently <90 mL/min signify possible Chronic Kidney     Disease.  CBC     Status:  Abnormal   Collection Time    04/29/13  7:32 AM      Result Value Range   WBC 2.6 (*) 4.0 - 10.5 K/uL   RBC 3.12 (*) 4.22 - 5.81 MIL/uL   Hemoglobin 9.3 (*) 13.0 - 17.0 g/dL   HCT 30.8 (*) 65.7 - 84.6 %   MCV 88.1  78.0 - 100.0 fL   MCH 29.8  26.0 - 34.0 pg   MCHC 33.8  30.0 - 36.0 g/dL   RDW 96.2  95.2 - 84.1 %   Platelets 165  150 - 400 K/uL  RENAL FUNCTION PANEL     Status: Abnormal   Collection Time    04/29/13  7:32 AM      Result Value  Range   Sodium 136  135 - 145 mEq/L   Potassium 4.0  3.5 - 5.1 mEq/L   Chloride 97  96 - 112 mEq/L   CO2 27  19 - 32 mEq/L   Glucose, Bld 84  70 - 99 mg/dL   BUN 37 (*) 6 - 23 mg/dL   Creatinine, Ser 3.24 (*) 0.50 - 1.35 mg/dL   Calcium 9.8  8.4 - 40.1 mg/dL   Phosphorus 3.9  2.3 - 4.6 mg/dL   Albumin 3.3 (*) 3.5 - 5.2 g/dL   GFR calc non Af Amer 5 (*) >90 mL/min   GFR calc Af Amer 6 (*) >90 mL/min   Comment: (NOTE)     The eGFR has been calculated using the CKD EPI equation.     This calculation has not been validated in all clinical situations.     eGFR's persistently <90 mL/min signify possible Chronic Kidney     Disease.    No results found.  Review of Systems  Constitutional: Negative for fever and chills.  HENT: Negative for hearing loss and neck pain.   Eyes: Negative for blurred vision and pain.  Respiratory: Negative for cough and hemoptysis.   Cardiovascular: Negative for chest pain and palpitations.  Gastrointestinal: Negative for nausea, vomiting and abdominal pain.  Genitourinary: Negative for urgency and frequency.  Musculoskeletal: Negative for myalgias.  Skin: Negative for rash.  Neurological: Negative for dizziness, tingling, tremors and headaches.  Endo/Heme/Allergies: Negative for environmental allergies. Bruises/bleeds easily.  Psychiatric/Behavioral: Negative for depression, suicidal ideas and substance abuse.   Blood pressure 128/85, pulse 73, temperature 98.8 F (37.1 C), temperature source Oral, resp. rate 20, height 6' 1.5" (1.867 m), weight 104.2 kg (229 lb 11.5 oz), SpO2 100.00%. Physical Exam  Nursing note and vitals reviewed. Constitutional: He is oriented to person, place, and time. He appears well-developed and well-nourished. No distress.  HENT:  Head: Normocephalic and atraumatic.  Nose: Nose normal.  Mouth/Throat: Oropharynx is clear and moist. No oropharyngeal exudate.  Eyes: Conjunctivae and EOM are normal. Pupils are equal, round, and  reactive to light. No scleral icterus.  Neck: Normal range of motion. Neck supple. JVD present. No tracheal deviation present. No thyromegaly present.  JVD 3 cm above clavicle at 45 degrees  Cardiovascular: Normal rate, normal heart sounds and intact distal pulses.  Exam reveals no gallop.   No murmur heard. Respiratory: Effort normal and breath sounds normal. No respiratory distress. He has no wheezes.  GI: Soft. Bowel sounds are normal. He exhibits no distension. There is no tenderness. There is no rebound.  Musculoskeletal: Normal range of motion. He exhibits no edema and no tenderness.  Neurological: He is alert and oriented to person, place, and time. No cranial  nerve deficit.  Skin: Skin is warm and dry. No rash noted. He is not diaphoretic. No erythema.  Psychiatric: He has a normal mood and affect. His behavior is normal. Thought content normal.   Labs reviewed; na 136, K 4.0, bun/cr 37/9.8, wbc 2.6, h/h 9.3/27, plt 165 8/14 Echo reviewed; EF 50-55%, mild to moderately dilated LV  Problem List Atrial fibrillation ESRD on hemodialysis Hypertension Hypercalcemia Anemia Leukopenia  Assessment/Plan: 62 yo man with ESRD on hemodialysis M/W/F, hypertension, secondary hyperparathyroidism, anemia found to have new onset atrial fibrillation. Cardiology consulted for need of anticoagulation and recommendations. From what I know of him and from my history his CHADS2VASC score is 1 so anticoagulation should be considered, especially given his chronic kidney disease. The best agent is probably warfarin as NOACs are processed/cleared through the kidneys. At the very least a baby aspirin daily is warranted. We will readdress and he will speak with his internal medicine hospitalist today as he thinks about it.  - asa 81 mg daily at the very least; warfarin is probably best anticoagulant strategy to start if he is willing - please update cardiology with any changes or questions  Glenroy Crossen,  Harnoor Kohles 04/29/2013, 11:54 PM

## 2013-04-29 NOTE — Progress Notes (Signed)
PT Cancellation Note  Patient Details Name: Trevor [name] MRN: [id number] DOB: April 19, 1951   Cancelled Treatment:    Reason Eval/Treat Not Completed: Patient at procedure or test/unavailable.  Will plan to see tomorrow.  Thanks!!   Herbert Marken 04/29/2013, 4:51 PM   Jake Shark, PT DPT [phone]

## 2013-04-29 NOTE — Progress Notes (Signed)
  Echocardiogram 2D Echocardiogram has been performed.  Trevor [name] [name] 04/29/2013, 5:34 PM

## 2013-04-29 NOTE — Progress Notes (Signed)
Last night pt states he needs dose of Renvela for snack in middle of night. Donnamarie Poag on call ordered to give pt a dose with snack. Pt has order for Renvela with snacks. Pt woke up for snack at 03:40 and requested Renvela dose with snack. 1 dose of 4000 mg given per order of give Renvela with snacks. Baron Hamper, RN

## 2013-04-29 NOTE — Progress Notes (Addendum)
Pt refuses to wear CPAP tonight, as he refused the previous night. Pt does not wear at home. No distress noted.

## 2013-04-29 NOTE — Progress Notes (Signed)
Subjective:  Seen on HD.  No new complaints.  Has not had echo done yet? Cards consult pending for today.  No further pre syncope or syncope Objective Vital signs in last 24 hours: Filed Vitals:   04/29/13 0727 04/29/13 0736 04/29/13 0800 04/29/13 0830  BP: 139/90 161/85 149/84 136/81  Pulse: 90 84 97 91  Temp:      TempSrc:      Resp: 18 18 19 19   Height:      Weight:      SpO2:       Weight change: -4.505 kg (-9 lb 14.9 oz)  Intake/Output Summary (Last 24 hours) at 04/29/13 0857 Last data filed at 04/29/13 0500  Gross per 24 hour  Intake   1180 ml  Output    400 ml  Net    780 ml    Assessment/ Plan: Pt is a 62 y.o. yo male ESRD who was admitted on 04/27/2013 with syncope/ new onset Afib Assessment/Plan: 1. Syncope- unknown if related to new onset A fib vs orthostasis (orthostatics slightly positive on admit)- has been walking in room- no further episodes or issues.  Afib per primary team.  Not sure what the plan is for that although pt seems to be tolerating well, echo ordered.   2. ESRD - normally MWF at [facility] via AVF-  3. Anemia-  hgb 9.3- was on low dose epo- needs to be increased- pt says ABL from AVF has caused which seems to be better here with tight heparin 4. Secondary hyperparathyroidism- pt very compliant with renvela keeps phos in check- now on his regular home dose 5. HTN/volume- actually seems OK, recent EDW increase as OP- on low dose labetalol only 6. Cough- CXR negative- ?allergies vs bronchitis vs GERD? Does not need to be an inpatient issue although seems to be pts primary concern- did not bring up today   Patches Mcdonnell A    Labs: Basic Metabolic Panel:  Recent Labs Lab 04/28/13 0545 04/29/13 0500 04/29/13 0732  NA 137 137 136  K 3.8 3.7 4.0  CL 97 97 97  CO2 29 29 27   GLUCOSE 81 82 84  BUN 25* 35* 37*  CREATININE 7.12* 9.24* 9.81*  CALCIUM 9.6 9.6 9.8  PHOS  --   --  3.9   Liver Function Tests:  Recent Labs Lab 04/29/13 0500  04/29/13 0732  AST 16  --   ALT 16  --   ALKPHOS 129*  --   BILITOT 0.5  --   PROT 7.4  --   ALBUMIN 3.2* 3.3*   No results found for this basename: LIPASE, AMYLASE,  in the last 168 hours No results found for this basename: AMMONIA,  in the last 168 hours CBC:  Recent Labs Lab 04/27/13 1049 04/28/13 0545 04/29/13 0732  WBC 3.1* 2.8* 2.6*  HGB 9.3* 9.1* 9.3*  HCT 27.0* 27.3* 27.5*  MCV 88.2 88.6 88.1  PLT 158 171 165   Cardiac Enzymes: No results found for this basename: CKTOTAL, CKMB, CKMBINDEX, TROPONINI,  in the last 168 hours CBG:  Recent Labs Lab 04/27/13 1116  GLUCAP 86    Iron Studies: No results found for this basename: IRON, TIBC, TRANSFERRIN, FERRITIN,  in the last 72 hours Studies/Results: Dg Chest 2 View  04/27/2013   *RADIOLOGY REPORT*  Clinical Data: Cough, history of MI, hypertension  CHEST - 2 VIEW  Comparison: 04/21/2013  Findings: Cardiomegaly again noted.  Again noted significant central vascular congestion.  No acute infiltrate  or pleural effusion.  No pulmonary edema.  Stable right paratracheal soft tissue prominence.  IMPRESSION: No active disease.  Cardiomegaly. Central vascular congestion again noted.  No significant change   Original Report Authenticated By: Natasha Mead, M.D.   Ct Head Wo Contrast  04/27/2013   *RADIOLOGY REPORT*  Clinical Data: Syncope, atrial fibrillation  CT HEAD WITHOUT CONTRAST  Technique:  Contiguous axial images were obtained from the base of the skull through the vertex without contrast.  Comparison: None.  Findings: There is nodular mucosal thickening posterior aspect of the left maxillary sinus measures about 2.9 cm probable mucous retention cyst.  The mastoid air cells are unremarkable.  No intracranial hemorrhage, mass effect or midline shift.  No hydrocephalus.  No acute infarction.  The gray and white matter differentiation is preserved.  No mass lesion is noted on this unenhanced scan.  IMPRESSION: No acute intracranial  abnormality.  Probable mucous retention cyst left maxillary sinus measures 2.9 cm.   Original Report Authenticated By: Natasha Mead, M.D.   Medications: Infusions:    Scheduled Medications: . [START ON 05/04/2013] darbepoetin  100 mcg Intravenous Q7 days  . labetalol  200 mg Oral BID  . sevelamer carbonate  4,000 mg Oral TID WC  . sevelamer carbonate  4,000 mg Oral With snacks  . sodium chloride  3 mL Intravenous Q12H    have reviewed scheduled and prn medications.  Physical Exam: General: NAD, clothes on - reading the paper- did not cough while i was in room Heart: irreg Lungs: mostly clear Abdomen: soft, non tender Extremities: no edema Dialysis Access: AVF right arm    04/29/2013,8:57 AM  LOS: 2 days

## 2013-04-29 NOTE — Progress Notes (Signed)
[facility] PROGRESS NOTE  Trevor [name] Trevor [name] DOB: 1950/11/16 Trevor [name]: 04/27/2013 PCP: No primary provider on file.  Assessment/Plan: 1. Chronic renal failure; after contacting Dr. Fayrene Fearing L. Deterding (nephrologist) Changed Renvela to 4000 mg by mouth q. a.c., q. snack per his instructions --- HD on M/W/F; patient's new base weight post HD is 232lbs    2. A flutter; stable, rate controlled  --Ensure all electrolytes are within normal limits for a renal patient on HD --- Consult cardiology spoke with Dr. Leeann Must concerning what type of anticoagulation to place patient on prior to discharge. Dr. Tresa Endo agreed to see patient and make recommendations     3. HTN; currently controlled no change  4. OSA; consult respiratory for CPAP. Patient will need sleep test upon discharge (patient of Trevor [name])  5. Syncope; see #2  Code Status: Full Family Communication: Wife and daughter present for discussion of plan of care Desposition Plan:     Consultants:  Nephrology  Procedures: Echocardiogram 04/29/2013 Left ventricle: The cavity size was mildly to moderately dilated. Wall thickness was increased in a pattern of mild LVH. LVEF= 50% to 55%. Wall motion was normal; there were no regional wall motion abnormalities. - Aortic valve: Sclerosis without stenosis. No significant regurgitation. - Aorta: Aortic root dimension: 33mm (ED). - Left atrium: The atrium was moderately dilated. - Right ventricle: The cavity size was mildly dilated. Systolic function was mildly reduced. - Right atrium: The atrium was mildly dilated. - Pulmonary arteries: PA peak pressure: 49mm Hg (S). - Pericardium, extracardiac: A trivial pericardial effusion was identified posterior to the heart.   Antibiotics:    HPI/Subjective: Trevor [name] is a 62 y.o. BM PMHx ESRD on dialysis M/W/,F, HTN, syncope, A. fib/flutter, OSA, who presents after a syncope episode. He relates he was walking on the  hallway, after a coughing episode, he pass out. He denies chest pain, dyspnea, palpitation prior to episode. He is feeling generalized weak, ever since his HB has been low. He report intermittent bleeding of AV fistula during dialysis. He has had a dry cough for last week. He had chest x ray done last Thursday,per patient chest x ray was negative for PNA. He had fever 2 weeks ago.  He denies focal weakness, slurred speech, headache, nausea, vomiting. Today feels great after his HD session today and is ready to go home. Only concern is that he would like to have his HD performed here secondary to feeling they do a much better job, and post HD he feels much better.  base weight post HD is 232lbs     Objective: Filed Vitals:   04/29/13 1150 04/29/13 1213 04/29/13 1235 04/29/13 1413  BP: 124/93  134/88 121/80  Pulse: 70 82 78 77  Temp:  98.6 F (37 C) 98.4 F (36.9 C) 98.5 F (36.9 C)  TempSrc:  Oral Oral Oral  Resp: 19 23 19 18   Height:      Weight:  104.2 kg (229 lb 11.5 oz)    SpO2:  97% 98% 96%    Intake/Output Summary (Last 24 hours) at 04/29/13 1829 Last data filed at 04/29/13 1500  Gross per 24 hour  Intake    720 ml  Output   2600 ml  Net  -1880 ml   Filed Weights   04/29/13 0526 04/29/13 0715 04/29/13 1213  Weight: 106.595 kg (235 lb) 106.7 kg (235 lb 3.7 oz) 104.2 kg (229 lb 11.5 oz)    Exam:   General:  Alert,  NAD  Cardiovascular: Regular rhythm and rate, negative murmurs rubs or gallops, DP/PT 2+ bilateral  Respiratory: Clear to auscultation bilateral  Abdomen: Soft nontender nondistended plus bowel sounds  Musculoskeletal: AVF present right brachial   Data Reviewed: Basic Metabolic Panel:  Recent Labs Lab 04/27/13 1049 04/28/13 0545 04/29/13 0500 04/29/13 0732  NA 133* 137 137 136  K 4.2 3.8 3.7 4.0  CL 92* 97 97 97  CO2 28 29 29 27   GLUCOSE 89 81 82 84  BUN 52* 25* 35* 37*  CREATININE 11.42* 7.12* 9.24* 9.81*  CALCIUM 10.0 9.6 9.6 9.8  MG  --    --  2.4  --   PHOS  --   --   --  3.9   Liver Function Tests:  Recent Labs Lab 04/29/13 0500 04/29/13 0732  AST 16  --   ALT 16  --   ALKPHOS 129*  --   BILITOT 0.5  --   PROT 7.4  --   ALBUMIN 3.2* 3.3*   No results found for this basename: LIPASE, AMYLASE,  in the last 168 hours No results found for this basename: AMMONIA,  in the last 168 hours CBC:  Recent Labs Lab 04/27/13 1049 04/28/13 0545 04/29/13 0732  WBC 3.1* 2.8* 2.6*  HGB 9.3* 9.1* 9.3*  HCT 27.0* 27.3* 27.5*  MCV 88.2 88.6 88.1  PLT 158 171 165   Cardiac Enzymes: No results found for this basename: CKTOTAL, CKMB, CKMBINDEX, TROPONINI,  in the last 168 hours BNP (last 3 results) No results found for this basename: PROBNP,  in the last 8760 hours CBG:  Recent Labs Lab 04/27/13 1116  GLUCAP 86    No results found for this or any previous visit (from the past 240 hour(s)).   Studies: No results found.  Scheduled Meds: . labetalol  200 mg Oral BID  . sevelamer carbonate  4,000 mg Oral TID WC  . sevelamer carbonate  4,000 mg Oral With snacks  . sodium chloride  3 mL Intravenous Q12H   Continuous Infusions:   Principal Problem:   Syncope Active Problems:   ANEMIA, IRON DEFICIENCY   OBSTRUCTIVE SLEEP APNEA   HYPERTENSION   Renal failure, chronic   Atrial flutter    Time spent: 45 minutes    Melady Chow, Roselind Messier  [facility] Pager [phone] If 7PM-7AM, please contact night-coverage at [email or web address], password [facility] 04/29/2013, 6:29 PM  LOS: 2 days

## 2013-04-29 NOTE — Procedures (Signed)
Patient was seen on dialysis and the procedure was supervised.  BFR 400  Via AVF BP is  123/80.   Patient appears to be tolerating treatment well  Trevor [name] A 04/29/2013

## 2013-04-29 NOTE — Progress Notes (Addendum)
Received pt from HD to room 4e29c via bed accompanied by transporter. Pt alert and oriented. VSS. Per dialysis RN 2200 cc of fluids taken out. Will continue to monitor.

## 2013-04-30 DIAGNOSIS — N179 Acute kidney failure, unspecified: Secondary | ICD-10-CM | POA: Diagnosis not present

## 2013-04-30 DIAGNOSIS — I4892 Unspecified atrial flutter: Secondary | ICD-10-CM | POA: Diagnosis not present

## 2013-04-30 DIAGNOSIS — R55 Syncope and collapse: Secondary | ICD-10-CM | POA: Diagnosis not present

## 2013-04-30 DIAGNOSIS — N2581 Secondary hyperparathyroidism of renal origin: Secondary | ICD-10-CM | POA: Diagnosis not present

## 2013-04-30 DIAGNOSIS — N189 Chronic kidney disease, unspecified: Secondary | ICD-10-CM | POA: Diagnosis not present

## 2013-04-30 DIAGNOSIS — I4891 Unspecified atrial fibrillation: Secondary | ICD-10-CM | POA: Diagnosis not present

## 2013-04-30 DIAGNOSIS — I120 Hypertensive chronic kidney disease with stage 5 chronic kidney disease or end stage renal disease: Secondary | ICD-10-CM | POA: Diagnosis not present

## 2013-04-30 DIAGNOSIS — D631 Anemia in chronic kidney disease: Secondary | ICD-10-CM | POA: Diagnosis not present

## 2013-04-30 DIAGNOSIS — N186 End stage renal disease: Secondary | ICD-10-CM | POA: Diagnosis not present

## 2013-04-30 LAB — COMPREHENSIVE METABOLIC PANEL
ALT: 18 U/L (ref 0–53)
AST: 24 U/L (ref 0–37)
Albumin: 3.3 g/dL — ABNORMAL LOW (ref 3.5–5.2)
Alkaline Phosphatase: 146 U/L — ABNORMAL HIGH (ref 39–117)
CO2: 28 mEq/L (ref 19–32)
Chloride: 97 mEq/L (ref 96–112)
GFR calc non Af Amer: 8 mL/min — ABNORMAL LOW (ref >90)
Potassium: 3.9 mEq/L (ref 3.5–5.1)
Sodium: 137 mEq/L (ref 135–145)
Total Bilirubin: 0.5 mg/dL (ref 0.3–1.2)

## 2013-04-30 LAB — MAGNESIUM: Magnesium: 2.2 mg/dL (ref 1.5–2.5)

## 2013-04-30 MED ORDER — ASPIRIN EC 81 MG PO TBEC: 81.0000 mg | DELAYED_RELEASE_TABLET | Freq: Every day | ORAL | Status: DC

## 2013-04-30 NOTE — Progress Notes (Signed)
Pt being d/c to home with family, pt given dc instructions, medications and follow up appointments given pt verbalized understanding, pt leaving with family via wheelchair, pt stable

## 2013-04-30 NOTE — Progress Notes (Signed)
Subjective:  Up brushing teeth, believes he is going to be discharged today.  Has concerns about coumadin- does not think that he will start- reiterates that HD is so much better in the hospital than it is at OP unit.  Objective Vital signs in last 24 hours: Filed Vitals:   04/29/13 1235 04/29/13 1413 04/29/13 2101 04/30/13 0407  BP: 134/88 121/80 128/85 126/90  Pulse: 78 77 73 80  Temp: 98.4 F (36.9 C) 98.5 F (36.9 C) 98.8 F (37.1 C) 98.3 F (36.8 C)  TempSrc: Oral Oral Oral Oral  Resp: 19 18 20 20   Height:      Weight:    105.7 kg (233 lb 0.4 oz)  SpO2: 98% 96% 100% 97%   Weight change: 0.105 kg (3.7 oz)  Intake/Output Summary (Last 24 hours) at 04/30/13 6578 Last data filed at 04/29/13 1500  Gross per 24 hour  Intake    240 ml  Output   2200 ml  Net  -1960 ml    Assessment/ Plan: Pt is a 62 y.o. yo male ESRD who was admitted on 04/27/2013 with syncope/ new onset Afib Assessment/Plan: 1. Syncope- unknown if related to new onset A fib vs orthostasis (orthostatics slightly positive on admit)- has been walking in room- no further episodes or issues.  Afib per primary team- echo normal- cards says consideration should be given to coumadin but patient doesn't sound like he wants.     2. ESRD - normally MWF at [facility] via AVF-  3. Anemia-  hgb 9.3- was on low dose epo- needs to be increased- pt says ABL from AVF has caused which seems to be better here with tight heparin 4. Secondary hyperparathyroidism- pt very compliant with renvela keeps phos in check- now on his regular home dose 5. HTN/volume- actually seems OK, recent EDW increase as OP- on low dose labetalol only 6. Cough- CXR negative- better with UF? 7. Dispo- discharge today- next OP HD on MOnday    Cheryle Dark A    Labs: Basic Metabolic Panel:  Recent Labs Lab 04/29/13 0500 04/29/13 0732 04/30/13 0350  NA 137 136 137  K 3.7 4.0 3.9  CL 97 97 97  CO2 29 27 28   GLUCOSE 82 84 75  BUN 35* 37* 22   CREATININE 9.24* 9.81* 6.46*  CALCIUM 9.6 9.8 9.6  PHOS  --  3.9  --    Liver Function Tests:  Recent Labs Lab 04/29/13 0500 04/29/13 0732 04/30/13 0350  AST 16  --  24  ALT 16  --  18  ALKPHOS 129*  --  146*  BILITOT 0.5  --  0.5  PROT 7.4  --  7.7  ALBUMIN 3.2* 3.3* 3.3*   No results found for this basename: LIPASE, AMYLASE,  in the last 168 hours No results found for this basename: AMMONIA,  in the last 168 hours CBC:  Recent Labs Lab 04/27/13 1049 04/28/13 0545 04/29/13 0732  WBC 3.1* 2.8* 2.6*  HGB 9.3* 9.1* 9.3*  HCT 27.0* 27.3* 27.5*  MCV 88.2 88.6 88.1  PLT 158 171 165   Cardiac Enzymes: No results found for this basename: CKTOTAL, CKMB, CKMBINDEX, TROPONINI,  in the last 168 hours CBG:  Recent Labs Lab 04/27/13 1116  GLUCAP 86    Iron Studies: No results found for this basename: IRON, TIBC, TRANSFERRIN, FERRITIN,  in the last 72 hours Studies/Results: No results found. Medications: Infusions:    Scheduled Medications: . labetalol  200 mg Oral BID  .  sevelamer carbonate  4,000 mg Oral TID WC  . sevelamer carbonate  4,000 mg Oral With snacks  . sodium chloride  3 mL Intravenous Q12H    have reviewed scheduled and prn medications.  Physical Exam: General: NAD, clothes on -did not cough while i was in room Heart: irreg Lungs: mostly clear Abdomen: soft, non tender Extremities: no edema Dialysis Access: AVF right arm    04/30/2013,9:07 AM  LOS: 3 days

## 2013-04-30 NOTE — Evaluation (Signed)
Physical Therapy Evaluation Patient Details Name: Trevor [name] MRN: [id number] DOB: 08-11-1951 Today's Date: 04/30/2013 Time: 4098-1191 PT Time Calculation (min): 15 min  PT Assessment / Plan / Recommendation History of Present Illness  Trevor [name] is a 62 yo man with OSA on CPAP, prior history of syncope, atrial flutter and ESRD on hemodialysis M/W/F with left arm AVF, hypertension with new onset atrial fibrillation. Cardiology consulted for potential need for anticoagulation. He's followed by Dr. Darrick Penna for his chronic renal failure, weight goal currently 232 lbs. On my exam, he's pleasant, doing well, tolerating dialysis and tells me he is using straight needles now so hopes that his bleeding/oozing problem from AVF will improve. He doesn't have infectious symptoms currently and we discussed the indications for anticoagulation for him given the stroke risks associated with atrial fibrillation. He is a bit hesitant and wants to think about it and readdress later today and/or tomorrow as he readies for discharge home.   Clinical Impression  Pt moving well and independent with all mobility.  Pt has no further questions and currently at baseline.  No PT needs therefore PT will sign off.  Pt has no OT needs as well.      PT Assessment  Patent does not need any further PT services    Follow Up Recommendations  No PT follow up    Equipment Recommendations  None recommended by PT    Precautions / Restrictions Precautions Precautions: None   Pertinent Vitals/Pain No c/o pain      Mobility  Bed Mobility Bed Mobility: Supine to Sit Supine to Sit: 7: Independent Transfers Transfers: Sit to Stand;Stand to Sit Sit to Stand: 7: Independent;From bed Stand to Sit: 7: Independent;To bed Ambulation/Gait Ambulation/Gait Assistance: 7: Independent Ambulation Distance (Feet): 300 Feet Assistive device: None Gait Pattern: Step-through pattern Gait velocity: wfl Stairs: Yes Stairs  Assistance: 7: Independent Stair Management Technique: No rails Number of Stairs: 8    Exercises     PT Diagnosis:    PT Problem List:   PT Treatment Interventions:       PT Goals(Current goals can be found in the care plan section) Acute Rehab PT Goals Patient Stated Goal: To go home today PT Goal Formulation: No goals set, d/c therapy  Visit Information  Last PT Received On: 04/30/13 History of Present Illness: Trevor [name] is a 62 yo man with OSA on CPAP, prior history of syncope, atrial flutter and ESRD on hemodialysis M/W/F with left arm AVF, hypertension with new onset atrial fibrillation. Cardiology consulted for potential need for anticoagulation. He's followed by Dr. Darrick Penna for his chronic renal failure, weight goal currently 232 lbs. On my exam, he's pleasant, doing well, tolerating dialysis and tells me he is using straight needles now so hopes that his bleeding/oozing problem from AVF will improve. He doesn't have infectious symptoms currently and we discussed the indications for anticoagulation for him given the stroke risks associated with atrial fibrillation. He is a bit hesitant and wants to think about it and readdress later today and/or tomorrow as he readies for discharge home.        Prior Functioning  Home Living Family/patient expects to be discharged to:: Private residence Living Arrangements: Spouse/significant other Available Help at Discharge: Family Type of Home: Apartment Home Access: Ramped entrance Home Layout: One level Home Equipment: None Prior Function Level of Independence: Independent Communication Communication: No difficulties    Cognition  Cognition Arousal/Alertness: Awake/alert Behavior During Therapy: WFL for tasks assessed/performed Overall Cognitive  Status: Within Functional Limits for tasks assessed    Extremity/Trunk Assessment Lower Extremity Assessment Lower Extremity Assessment: Overall WFL for tasks assessed   Balance High  Level Balance High Level Balance Activites: Side stepping;Direction changes;Turns;Head turns  End of Session PT - End of Session Activity Tolerance: Patient tolerated treatment well Patient left: in bed (sitting EOB) Nurse Communication: Mobility status  GP     Aime Meloche 04/30/2013, 8:44 AM   Jake Shark, PT DPT [phone]

## 2013-04-30 NOTE — Discharge Summary (Signed)
Physician Discharge Summary  RIAN BUSCHE ZOX:[id number] DOB: Aug 25, 1951 DOA: 04/27/2013  PCP: No primary provider on file.  Admit date: 04/27/2013 Discharge date: 04/30/2013  Time spent: 40 minutes  Recommendations for Outpatient Follow-up:  1. Followup with primary care physician within one week. 2. Followup with cardiology next week.  Discharge Diagnoses:  Principal Problem:   Atrial fibrillation Active Problems:   ANEMIA, IRON DEFICIENCY   OBSTRUCTIVE SLEEP APNEA   HYPERTENSION   Renal failure, chronic   Syncope   Atrial flutter   Discharge Condition: Stable  Diet recommendation: Heart healthy, low potassium renal diet  Filed Weights   04/29/13 0715 04/29/13 1213 04/30/13 0407  Weight: 106.7 kg (235 lb 3.7 oz) 104.2 kg (229 lb 11.5 oz) 105.7 kg (233 lb 0.4 oz)    History of present illness:  Trevor NEMETZ is a 62 y.o. male very pleasant with PMH significant for ESRD on dialysis M, W, F who presents after a syncope episode. He relates he was walking on the hallway, after a coughing episode, he pass out. He denies chest pain, dyspnea, palpitation prior to episode. He is feeling generalized weak, ever since his HB has been low. He report intermittent bleeding of AV fistula during dialysis. He has had a dry cough for last week. He had chest x ray done last Thursday,per patient chest x ray was negative for PNA. He had fever 2 weeks ago.  He denies focal weakness, slurred speech, headache, nausea, vomiting.   Hospital Course:   1. Syncope: Unclear etiology, could be secondary to his atrial fibrillation/flutter. Patient mentioned recent history of high fever of 102.7 that comes and goes along with cough, chest x-ray was negative for pneumonia. Patient heart rate was controlled during this hospital stay, his rhythm is atrial fibrillation.  2. Atrial fibrillation: Upon admission to the hospital for syncope was found to be an atrial fibrillation, his 2-D echocardiogram showed  moderate dilatation of left atrium which can predispose patient to paroxysmal atrial fibrillation. Cardiology recommended Coumadin and once its therapeutic for 4 consecutive weeks attempt DCCV followed by 4 more weeks of anticoagulation. That was discussed with the patient in length. His CHADSVASC score is 1, he remains in atrial fibrillation while he is in the hospital. So cardiology recommended Coumadin, patient said he will do the low-dose aspirin for now, and he will consult with his son who is a neurologist in [place]. Patient will followup with cardiology as outpatient, he understands his high risk of developing strokes. Please note that he has OSA, his medication was not changed, no need for addition of antiarrhythmic.  3. ESRD: Nephrology was consulted, routine dialysis session was continued throughout the hospital stay.  4. HTN: His preadmission oral medications continued without any change during this hospital stay.  5. OSA: CPAP continued to  Procedures:  None  Consultations:  Cardiology.  Nephrology  Discharge Exam: Filed Vitals:   04/30/13 0407  BP: 126/90  Pulse: 80  Temp: 98.3 F (36.8 C)  Resp: 20   General: Alert and awake, oriented x3, not in any acute distress. HEENT: anicteric sclera, pupils reactive to light and accommodation, EOMI CVS: S1-S2 clear, no murmur rubs or gallops Chest: clear to auscultation bilaterally, no wheezing, rales or rhonchi Abdomen: soft nontender, nondistended, normal bowel sounds, no organomegaly Extremities: no cyanosis, clubbing or edema noted bilaterally Neuro: Cranial nerves II-XII intact, no focal neurological deficits  Discharge Instructions  Discharge Orders   Future Orders Complete By Expires   Diet -  low sodium heart healthy  As directed    Increase activity slowly  As directed        Medication List         amLODipine 10 MG tablet  Commonly known as:  NORVASC  Take 10 mg by mouth daily.     Arginine 1000 MG Tabs   Take 2,000 mg by mouth daily.     aspirin EC 81 MG tablet  Take 1 tablet (81 mg total) by mouth daily.     sevelamer carbonate 800 MG tablet  Commonly known as:  RENVELA  Take 4,000 mg by mouth 3 (three) times daily with meals.     TRANDATE 200 MG tablet  Generic drug:  labetalol  Take 200 mg by mouth 2 (two) times daily.       Allergies  Allergen Reactions  . Oxycodone-Acetaminophen     REACTION: nausea  . Penicillins Other (See Comments)    Reaction to PCN unknown       Follow-up Information   Follow up with Lesleigh Noe, MD In 1 week.   Specialty:  Cardiology   Contact information:   [address] [place] [address] [phone]       The results of significant diagnostics from this hospitalization (including imaging, microbiology, ancillary and laboratory) are listed below for reference.    Significant Diagnostic Studies: Dg Chest 2 View  04/27/2013   *RADIOLOGY REPORT*  Clinical Data: Cough, history of MI, hypertension  CHEST - 2 VIEW  Comparison: 04/21/2013  Findings: Cardiomegaly again noted.  Again noted significant central vascular congestion.  No acute infiltrate or pleural effusion.  No pulmonary edema.  Stable right paratracheal soft tissue prominence.  IMPRESSION: No active disease.  Cardiomegaly. Central vascular congestion again noted.  No significant change   Original Report Authenticated By: Natasha Mead, M.D.   Dg Chest 2 View  04/21/2013   *RADIOLOGY REPORT*  Clinical Data: Cough for 2 weeks, dialysis patient, former smoking history  CHEST - 2 VIEW  Comparison: Chest x-ray of 05/01/2011,  04/08/2010 and 12/04/2008.  Findings: Moderate cardiomegaly is stable as is the prominent right paratracheal soft tissue.  The hila also are prominent consistent with pulmonary arterial hypertension.  No focal infiltrate or effusion is seen.  No acute bony abnormality is noted with diffuse degenerative change throughout the thoracic spine.   IMPRESSION: Stable cardiomegaly and prominent pulmonary artery segments.  No definite active process.   Original Report Authenticated By: Dwyane Dee, M.D.   Ct Head Wo Contrast  04/27/2013   *RADIOLOGY REPORT*  Clinical Data: Syncope, atrial fibrillation  CT HEAD WITHOUT CONTRAST  Technique:  Contiguous axial images were obtained from the base of the skull through the vertex without contrast.  Comparison: None.  Findings: There is nodular mucosal thickening posterior aspect of the left maxillary sinus measures about 2.9 cm probable mucous retention cyst.  The mastoid air cells are unremarkable.  No intracranial hemorrhage, mass effect or midline shift.  No hydrocephalus.  No acute infarction.  The gray and white matter differentiation is preserved.  No mass lesion is noted on this unenhanced scan.  IMPRESSION: No acute intracranial abnormality.  Probable mucous retention cyst left maxillary sinus measures 2.9 cm.   Original Report Authenticated By: Natasha Mead, M.D.    Microbiology: No results found for this or any previous visit (from the past 240 hour(s)).   Labs: Basic Metabolic Panel:  Recent Labs Lab 04/27/13 1049 04/28/13 0545 04/29/13 0500  04/29/13 0732 04/30/13 0350  NA 133* 137 137 136 137  K 4.2 3.8 3.7 4.0 3.9  CL 92* 97 97 97 97  CO2 28 29 29 27 28   GLUCOSE 89 81 82 84 75  BUN 52* 25* 35* 37* 22  CREATININE 11.42* 7.12* 9.24* 9.81* 6.46*  CALCIUM 10.0 9.6 9.6 9.8 9.6  MG  --   --  2.4  --  2.2  PHOS  --   --   --  3.9  --    Liver Function Tests:  Recent Labs Lab 04/29/13 0500 04/29/13 0732 04/30/13 0350  AST 16  --  24  ALT 16  --  18  ALKPHOS 129*  --  146*  BILITOT 0.5  --  0.5  PROT 7.4  --  7.7  ALBUMIN 3.2* 3.3* 3.3*   No results found for this basename: LIPASE, AMYLASE,  in the last 168 hours No results found for this basename: AMMONIA,  in the last 168 hours CBC:  Recent Labs Lab 04/27/13 1049 04/28/13 0545 04/29/13 0732  WBC 3.1* 2.8* 2.6*  HGB  9.3* 9.1* 9.3*  HCT 27.0* 27.3* 27.5*  MCV 88.2 88.6 88.1  PLT 158 171 165   Cardiac Enzymes: No results found for this basename: CKTOTAL, CKMB, CKMBINDEX, TROPONINI,  in the last 168 hours BNP: BNP (last 3 results) No results found for this basename: PROBNP,  in the last 8760 hours CBG:  Recent Labs Lab 04/27/13 1116  GLUCAP 86       Signed:  Stace Peace A  [facility] 04/30/2013, 12:23 PM

## 2013-04-30 NOTE — Progress Notes (Signed)
SUBJECTIVE:  No compliants  OBJECTIVE:   Vitals:   Filed Vitals:   04/29/13 1235 04/29/13 1413 04/29/13 2101 04/30/13 0407  BP: 134/88 121/80 128/85 126/90  Pulse: 78 77 73 80  Temp: 98.4 F (36.9 C) 98.5 F (36.9 C) 98.8 F (37.1 C) 98.3 F (36.8 C)  TempSrc: Oral Oral Oral Oral  Resp: 19 18 20 20   Height:      Weight:    105.7 kg (233 lb 0.4 oz)  SpO2: 98% 96% 100% 97%   I&O's:   Intake/Output Summary (Last 24 hours) at 04/30/13 4098 Last data filed at 04/30/13 1191  Gross per 24 hour  Intake    480 ml  Output   2200 ml  Net  -1720 ml   TELEMETRY: Reviewed telemetry pt in atrial fibrilation:     PHYSICAL EXAM Not performed - patient was in consult with hospitalist at the time I spoke to him  LABS: Basic Metabolic Panel:  Recent Labs  47/82/95 0500 04/29/13 0732 04/30/13 0350  NA 137 136 137  K 3.7 4.0 3.9  CL 97 97 97  CO2 29 27 28   GLUCOSE 82 84 75  BUN 35* 37* 22  CREATININE 9.24* 9.81* 6.46*  CALCIUM 9.6 9.8 9.6  MG 2.4  --  2.2  PHOS  --  3.9  --    Liver Function Tests:  Recent Labs  04/29/13 0500 04/29/13 0732 04/30/13 0350  AST 16  --  24  ALT 16  --  18  ALKPHOS 129*  --  146*  BILITOT 0.5  --  0.5  PROT 7.4  --  7.7  ALBUMIN 3.2* 3.3* 3.3*   No results found for this basename: LIPASE, AMYLASE,  in the last 72 hours CBC:  Recent Labs  04/28/13 0545 04/29/13 0732  WBC 2.8* 2.6*  HGB 9.1* 9.3*  HCT 27.3* 27.5*  MCV 88.6 88.1  PLT 171 165   Cardiac Enzymes: No results found for this basename: CKTOTAL, CKMB, CKMBINDEX, TROPONINI,  in the last 72 hours BNP: No components found with this basename: POCBNP,  D-Dimer: No results found for this basename: DDIMER,  in the last 72 hours Hemoglobin A1C: No results found for this basename: HGBA1C,  in the last 72 hours Fasting Lipid Panel: No results found for this basename: CHOL, HDL, LDLCALC, TRIG, CHOLHDL, LDLDIRECT,  in the last 72 hours Thyroid Function Tests: No results  found for this basename: TSH, T4TOTAL, FREET3, T3FREE, THYROIDAB,  in the last 72 hours Anemia Panel: No results found for this basename: VITAMINB12, FOLATE, FERRITIN, TIBC, IRON, RETICCTPCT,  in the last 72 hours Coag Panel:   Lab Results  Component Value Date   INR 1.05 05/08/2011    RADIOLOGY: Dg Chest 2 View  04/27/2013   *RADIOLOGY REPORT*  Clinical Data: Cough, history of MI, hypertension  CHEST - 2 VIEW  Comparison: 04/21/2013  Findings: Cardiomegaly again noted.  Again noted significant central vascular congestion.  No acute infiltrate or pleural effusion.  No pulmonary edema.  Stable right paratracheal soft tissue prominence.  IMPRESSION: No active disease.  Cardiomegaly. Central vascular congestion again noted.  No significant change   Original Report Authenticated By: Natasha Mead, M.D.   Dg Chest 2 View  04/21/2013   *RADIOLOGY REPORT*  Clinical Data: Cough for 2 weeks, dialysis patient, former smoking history  CHEST - 2 VIEW  Comparison: Chest x-ray of 05/01/2011,  04/08/2010 and 12/04/2008.  Findings: Moderate cardiomegaly is stable as is the  prominent right paratracheal soft tissue.  The hila also are prominent consistent with pulmonary arterial hypertension.  No focal infiltrate or effusion is seen.  No acute bony abnormality is noted with diffuse degenerative change throughout the thoracic spine.  IMPRESSION: Stable cardiomegaly and prominent pulmonary artery segments.  No definite active process.   Original Report Authenticated By: Dwyane Dee, M.D.   Ct Head Wo Contrast  04/27/2013   *RADIOLOGY REPORT*  Clinical Data: Syncope, atrial fibrillation  CT HEAD WITHOUT CONTRAST  Technique:  Contiguous axial images were obtained from the base of the skull through the vertex without contrast.  Comparison: None.  Findings: There is nodular mucosal thickening posterior aspect of the left maxillary sinus measures about 2.9 cm probable mucous retention cyst.  The mastoid air cells are unremarkable.   No intracranial hemorrhage, mass effect or midline shift.  No hydrocephalus.  No acute infarction.  The gray and white matter differentiation is preserved.  No mass lesion is noted on this unenhanced scan.  IMPRESSION: No acute intracranial abnormality.  Probable mucous retention cyst left maxillary sinus measures 2.9 cm.   Original Report Authenticated By: Natasha Mead, M.D.      ASSESSMENT/PLAN: Problem List  Atrial fibrillation  ESRD on hemodialysis  Hypertension  Hypercalcemia  Anemia  Leukopenia  Assessment/Plan:  62 yo man with ESRD on hemodialysis M/W/F, hypertension, secondary hyperparathyroidism, anemia found to have new onset atrial fibrillation. Cardiology consulted for need of anticoagulation and recommendations. From what I know of him and from my history his CHADS2VASC score is 1  -since he is in atrial fibrillation of unknown duration he should be placed on Coumadin and once therapeutic for 4 weeks then attempt DCCV.  He would need to remain on coumadin for 4 weeks after that and then if no more afib could be converted to low dose ASA given his low CHADSVASC score of 1.  I am concerned that his LA is moderately dilated which may predispose him to recurrent PAF.  I discussed the plan with the patient and informed him of his increased risk of CVA while currently in afib and recommended starting warfarin.  He said that he understood the risk and would take the risk as he does not want to be on warfarin.  He wants to wait until I talk with Dr. Cherie Ouch next week (He is not on call this weekend).   -please have him followup with me in clinic next week       [name], MD  04/30/2013  9:27 AM

## 2013-05-02 ENCOUNTER — Emergency Department (HOSPITAL_COMMUNITY): Payer: Medicare Other

## 2013-05-02 ENCOUNTER — Inpatient Hospital Stay (HOSPITAL_COMMUNITY)
Admission: EM | Admit: 2013-05-02 | Discharge: 2013-05-07 | DRG: 252 | Disposition: A | Discharge status: Home / Self Care | Payer: Medicare Other | Attending: Internal Medicine | Admitting: Internal Medicine

## 2013-05-02 ENCOUNTER — Encounter (HOSPITAL_COMMUNITY): Payer: Self-pay | Admitting: Emergency Medicine

## 2013-05-02 DIAGNOSIS — N186 End stage renal disease: Secondary | ICD-10-CM

## 2013-05-02 DIAGNOSIS — Z87891 Personal history of nicotine dependence: Secondary | ICD-10-CM | POA: Diagnosis not present

## 2013-05-02 DIAGNOSIS — G4733 Obstructive sleep apnea (adult) (pediatric): Secondary | ICD-10-CM

## 2013-05-02 DIAGNOSIS — R509 Fever, unspecified: Secondary | ICD-10-CM

## 2013-05-02 DIAGNOSIS — N189 Chronic kidney disease, unspecified: Secondary | ICD-10-CM

## 2013-05-02 DIAGNOSIS — Z992 Dependence on renal dialysis: Secondary | ICD-10-CM

## 2013-05-02 DIAGNOSIS — R55 Syncope and collapse: Secondary | ICD-10-CM

## 2013-05-02 DIAGNOSIS — E559 Vitamin D deficiency, unspecified: Secondary | ICD-10-CM | POA: Diagnosis present

## 2013-05-02 DIAGNOSIS — IMO0001 Reserved for inherently not codable concepts without codable children: Secondary | ICD-10-CM

## 2013-05-02 DIAGNOSIS — R059 Cough, unspecified: Secondary | ICD-10-CM

## 2013-05-02 DIAGNOSIS — R6889 Other general symptoms and signs: Secondary | ICD-10-CM | POA: Diagnosis not present

## 2013-05-02 DIAGNOSIS — I10 Essential (primary) hypertension: Secondary | ICD-10-CM

## 2013-05-02 DIAGNOSIS — I120 Hypertensive chronic kidney disease with stage 5 chronic kidney disease or end stage renal disease: Secondary | ICD-10-CM | POA: Diagnosis present

## 2013-05-02 DIAGNOSIS — D638 Anemia in other chronic diseases classified elsewhere: Secondary | ICD-10-CM | POA: Diagnosis present

## 2013-05-02 DIAGNOSIS — I700 Atherosclerosis of aorta: Secondary | ICD-10-CM | POA: Diagnosis not present

## 2013-05-02 DIAGNOSIS — J309 Allergic rhinitis, unspecified: Secondary | ICD-10-CM

## 2013-05-02 DIAGNOSIS — I4891 Unspecified atrial fibrillation: Secondary | ICD-10-CM | POA: Diagnosis not present

## 2013-05-02 DIAGNOSIS — R1013 Epigastric pain: Secondary | ICD-10-CM | POA: Diagnosis not present

## 2013-05-02 DIAGNOSIS — D649 Anemia, unspecified: Secondary | ICD-10-CM | POA: Diagnosis present

## 2013-05-02 DIAGNOSIS — T82898A Other specified complication of vascular prosthetic devices, implants and grafts, initial encounter: Secondary | ICD-10-CM | POA: Diagnosis not present

## 2013-05-02 DIAGNOSIS — T827XXA Infection and inflammatory reaction due to other cardiac and vascular devices, implants and grafts, initial encounter: Principal | ICD-10-CM | POA: Diagnosis present

## 2013-05-02 DIAGNOSIS — I509 Heart failure, unspecified: Secondary | ICD-10-CM | POA: Diagnosis present

## 2013-05-02 DIAGNOSIS — R05 Cough: Secondary | ICD-10-CM | POA: Diagnosis not present

## 2013-05-02 DIAGNOSIS — R0989 Other specified symptoms and signs involving the circulatory and respiratory systems: Secondary | ICD-10-CM | POA: Diagnosis not present

## 2013-05-02 DIAGNOSIS — D509 Iron deficiency anemia, unspecified: Secondary | ICD-10-CM

## 2013-05-02 DIAGNOSIS — J069 Acute upper respiratory infection, unspecified: Secondary | ICD-10-CM | POA: Diagnosis present

## 2013-05-02 DIAGNOSIS — N2581 Secondary hyperparathyroidism of renal origin: Secondary | ICD-10-CM

## 2013-05-02 DIAGNOSIS — D631 Anemia in chronic kidney disease: Secondary | ICD-10-CM | POA: Diagnosis not present

## 2013-05-02 DIAGNOSIS — M109 Gout, unspecified: Secondary | ICD-10-CM | POA: Diagnosis present

## 2013-05-02 DIAGNOSIS — Z48812 Encounter for surgical aftercare following surgery on the circulatory system: Secondary | ICD-10-CM | POA: Diagnosis not present

## 2013-05-02 DIAGNOSIS — I4892 Unspecified atrial flutter: Secondary | ICD-10-CM

## 2013-05-02 DIAGNOSIS — E569 Vitamin deficiency, unspecified: Secondary | ICD-10-CM

## 2013-05-02 HISTORY — DX: Shortness of breath: R06.02

## 2013-05-02 HISTORY — DX: Personal history of other diseases of the musculoskeletal system and connective tissue: Z87.39

## 2013-05-02 HISTORY — DX: Heart failure, unspecified: I50.9

## 2013-05-02 HISTORY — DX: End stage renal disease: N18.6

## 2013-05-02 HISTORY — DX: Dependence on renal dialysis: Z99.2

## 2013-05-02 LAB — COMPREHENSIVE METABOLIC PANEL
AST: 26 U/L (ref 0–37)
Albumin: 3.5 g/dL (ref 3.5–5.2)
BUN: 23 mg/dL (ref 6–23)
Creatinine, Ser: 6.31 mg/dL — ABNORMAL HIGH (ref 0.50–1.35)
Total Protein: 8.2 g/dL (ref 6.0–8.3)

## 2013-05-02 LAB — PROTIME-INR
INR: 1.09 (ref 0.00–1.49)
Prothrombin Time: 13.9 seconds (ref 11.6–15.2)

## 2013-05-02 LAB — CBC WITH DIFFERENTIAL/PLATELET
Basophils Absolute: 0 10*3/uL (ref 0.0–0.1)
Basophils Relative: 1 % (ref 0–1)
Eosinophils Absolute: 0.1 10*3/uL (ref 0.0–0.7)
HCT: 30.3 % — ABNORMAL LOW (ref 39.0–52.0)
Hemoglobin: 10.4 g/dL — ABNORMAL LOW (ref 13.0–17.0)
MCH: 29.8 pg (ref 26.0–34.0)
MCHC: 34.3 g/dL (ref 30.0–36.0)
Monocytes Absolute: 0.6 10*3/uL (ref 0.1–1.0)
Monocytes Relative: 13 % — ABNORMAL HIGH (ref 3–12)
Neutro Abs: 3.2 10*3/uL (ref 1.7–7.7)
Neutrophils Relative %: 77 % (ref 43–77)
RDW: 14.3 % (ref 11.5–15.5)

## 2013-05-02 LAB — APTT: aPTT: 25 seconds (ref 24–37)

## 2013-05-02 MED ORDER — ACETAMINOPHEN 325 MG PO TABS
650.0000 mg | ORAL_TABLET | Freq: Four times a day (QID) | ORAL | Status: DC | PRN
  Administered 2013-05-02: 650 mg via ORAL
  Filled 2013-05-02 (×2): qty 2

## 2013-05-02 NOTE — H&P (Signed)
Trevor [name] is an 62 y.o. male.   Chief Complaint: fever HPI:  62 yo man with recent admission for syncope presents with CC of fever.  Reports he went to HD today and after getting home developed chills and fever of 102 prompting his visit to Ed.  Upon arrival, temp noted to be 103.  He has had a cough for about 1 month now, occasional blood tinging.  PCP started on amoxil but only has had 1 dose.  He also reports pain and button hole from AVF in LUE.  He states that they have stopped accessing that area due to pain and used another site.  He reports it has "come to a head" recently, described as a small bulge associated with some sharp pains.    Past Medical History  Diagnosis Date  . Anemia   . CRF (chronic renal failure)     Stage 4  . AVF (arteriovenous fistula)     Left  . Secondary hyperparathyroidism   . Hypovitaminosis D   . Hypertensive urgency     H/o    Past Surgical History  Procedure Laterality Date  . Left avf  12/14/07    Dr. Charlean Sanfilippo  . Right avf    . Left knee arthroscopy      Family History  Problem Relation Age of Onset  . Hypertension Father   . Kidney disease Father   . Allergies Father    Social History:  reports that he has quit smoking. He has never used smokeless tobacco. He reports that  drinks alcohol. He reports that he does not use illicit drugs.  Allergies:  Allergies  Allergen Reactions  . Oxycodone-Acetaminophen     REACTION: nausea     (Not in a hospital admission)  Results for orders placed during the hospital encounter of 05/02/13 (from the past 48 hour(s))  CBC WITH DIFFERENTIAL     Status: Abnormal   Collection Time    05/02/13  9:23 PM      Result Value Range   WBC 4.1  4.0 - 10.5 K/uL   RBC 3.49 (*) 4.22 - 5.81 MIL/uL   Hemoglobin 10.4 (*) 13.0 - 17.0 g/dL   HCT 91.4 (*) 78.2 - 95.6 %   MCV 86.8  78.0 - 100.0 fL   MCH 29.8  26.0 - 34.0 pg   MCHC 34.3  30.0 - 36.0 g/dL   RDW 21.3  08.6 - 57.8 %   Platelets 193  150 - 400  K/uL   Neutrophils Relative % 77  43 - 77 %   Neutro Abs 3.2  1.7 - 7.7 K/uL   Lymphocytes Relative 8 (*) 12 - 46 %   Lymphs Abs 0.3 (*) 0.7 - 4.0 K/uL   Monocytes Relative 13 (*) 3 - 12 %   Monocytes Absolute 0.6  0.1 - 1.0 K/uL   Eosinophils Relative 2  0 - 5 %   Eosinophils Absolute 0.1  0.0 - 0.7 K/uL   Basophils Relative 1  0 - 1 %   Basophils Absolute 0.0  0.0 - 0.1 K/uL  COMPREHENSIVE METABOLIC PANEL     Status: Abnormal   Collection Time    05/02/13  9:23 PM      Result Value Range   Sodium 137  135 - 145 mEq/L   Potassium 3.9  3.5 - 5.1 mEq/L   Chloride 95 (*) 96 - 112 mEq/L   CO2 31  19 - 32 mEq/L   Glucose,  Bld 71  70 - 99 mg/dL   BUN 23  6 - 23 mg/dL   Creatinine, Ser 4.09 (*) 0.50 - 1.35 mg/dL   Calcium 9.8  8.4 - 81.1 mg/dL   Total Protein 8.2  6.0 - 8.3 g/dL   Albumin 3.5  3.5 - 5.2 g/dL   AST 26  0 - 37 U/L   ALT 17  0 - 53 U/L   Alkaline Phosphatase 152 (*) 39 - 117 U/L   Total Bilirubin 0.8  0.3 - 1.2 mg/dL   GFR calc non Af Amer 9 (*) >90 mL/min   GFR calc Af Amer 10 (*) >90 mL/min   Comment: (NOTE)     The eGFR has been calculated using the CKD EPI equation.     This calculation has not been validated in all clinical situations.     eGFR's persistently <90 mL/min signify possible Chronic Kidney     Disease.  PROTIME-INR     Status: None   Collection Time    05/02/13  9:23 PM      Result Value Range   Prothrombin Time 13.9  11.6 - 15.2 seconds   INR 1.09  0.00 - 1.49  APTT     Status: None   Collection Time    05/02/13  9:23 PM      Result Value Range   aPTT 25  24 - 37 seconds  CG4 I-STAT (LACTIC ACID)     Status: None   Collection Time    05/02/13  9:27 PM      Result Value Range   Lactic Acid, Venous 2.14  0.5 - 2.2 mmol/L   Dg Chest Port 1 View  05/02/2013   *RADIOLOGY REPORT*  Clinical Data: Fever  PORTABLE CHEST - 1 VIEW  Comparison: April 27, 2013.  Findings: Stable cardiomegaly and central pulmonary vascular congestion.  No acute  pulmonary disease is noted.  No pleural effusion or pneumothorax is noted.  Bony thorax is intact.  IMPRESSION: Stable cardiomegaly and central pulmonary vascular congestion.   Original Report Authenticated By: Lupita Raider.,  M.D.    Review of Systems  All other systems reviewed and are negative.    Blood pressure 145/90, pulse 102, temperature 103 F (39.4 C), temperature source Oral, resp. rate 33, SpO2 97.00%. Physical Exam  Constitutional: He is oriented to person, place, and time. He appears well-developed and well-nourished. No distress.  HENT:  Head: Normocephalic and atraumatic.  Mouth/Throat: Oropharynx is clear and moist. No oropharyngeal exudate.  Eyes: Conjunctivae and EOM are normal. Pupils are equal, round, and reactive to light. Right eye exhibits no discharge. Left eye exhibits no discharge. No scleral icterus.  Neck: Normal range of motion. Neck supple. No JVD present. No thyromegaly present.  Cardiovascular: Normal heart sounds.  Exam reveals no gallop and no friction rub.   No murmur heard. Tachy and irregular  Respiratory: Effort normal and breath sounds normal. No respiratory distress. He has no wheezes. He has no rales.  GI: Soft. Bowel sounds are normal. He exhibits no distension. There is no tenderness. There is no rebound and no guarding.  Musculoskeletal: Normal range of motion. He exhibits no edema and no tenderness.  Lymphadenopathy:    He has no cervical adenopathy.  Neurological: He is alert and oriented to person, place, and time. No cranial nerve deficit.  Skin: Skin is warm and dry. He is not diaphoretic.  Warm to touch.  AVF LUE with palpable thrill.  1x1  cm nodule, pale coloration with eschar.     Assessment/Plan 62 yo man with recent admission presents with fever.  Fever:  Etiology unclear at this time.  Concern for infection of AVF in LUE given symtpoms and physical findings.  Also, viral URI possible in setting of fever and coughs though no  other systemic symptoms.  Bacterial PNA felt to be less likely with lack of leukocytosis or clear infiltrate on exam.  - blood, urine and sputum culture - doppler of AVF to further assess - IV vancomycin 1 gm now, will need to follow levels and re-dose  ESRD: - consult nephrology in AM for ongoing HD, s/p HD today and lytes wnl  A Fib/Flutter with RVR:  RVR may be related to febrile illness.   - monitor on tele - continue labetalol - will hold off on further rate control as this may be precipitated by febrile illness - Can discuss anticoagulation with patient, his CHADS2 score is 1 and options include full dose ASA vs coumadin.  Anemia: - stable  HTN: - continue home meds   Trevor [name] 05/02/2013, 11:35 PM

## 2013-05-02 NOTE — ED Provider Notes (Signed)
CSN: [id number]     Arrival date & time 05/02/13  2042 History   First MD Initiated Contact with Patient 05/02/13 2100     Chief Complaint  Patient presents with  . Fever   (Consider location/radiation/quality/duration/timing/severity/associated sxs/prior Treatment) HPI Pt recently d/c from hospital for syncope. Went to HD today and spiked temp to 102 this evening. Pt reports persistent cough with small amount of hemoptysis. Was started on Amoxicillin by PMD yesterday and has taken 1 dose. +persistent R fistula pain with episodic bleeding. No lower ext pain or swelling. Pt makes small amount of urine without recent urinary changes.  Past Medical History  Diagnosis Date  . Anemia   . AVF (arteriovenous fistula)     Left  . Secondary hyperparathyroidism   . Hypovitaminosis D   . Hypertensive urgency     H/o  . CHF (congestive heart failure)   . Exertional shortness of breath     "related to infection in my lungs right now" (05/03/2013)  . History of gout     "before I started doing the dialysis" (05/03/2013)  . ESRD (end stage renal disease) on dialysis     "[facility]; MWF" (05/03/2013)   Past Surgical History  Procedure Laterality Date  . Av fistula placement Left     Dr. Charlean Sanfilippo; "I've had 2 on the left' (05/03/2013)  . Av fistula placement Right ~ 2011  . Knee arthroscopy Left    Family History  Problem Relation Age of Onset  . Hypertension Father   . Kidney disease Father   . Allergies Father    History  Substance Use Topics  . Smoking status: Former Smoker -- 0.25 packs/day for .5 years    Types: Cigarettes  . Smokeless tobacco: Never Used     Comment: 05/03/2013 "stopped smoking ~ 40 yr ago"  . Alcohol Use: Yes     Comment: 05/03/2013 "haven't had a glass of wine in ~ 3 months or so; sometimes will have one w/dinner"    Review of Systems  Constitutional: Positive for fever and chills.  HENT: Negative for rhinorrhea, neck pain, neck stiffness and sinus pressure.    Respiratory: Positive for cough. Negative for shortness of breath and wheezing.   Cardiovascular: Negative for chest pain, palpitations and leg swelling.  Gastrointestinal: Negative for nausea, vomiting, abdominal pain and diarrhea.  Genitourinary: Negative for dysuria, frequency and flank pain.  Musculoskeletal: Negative for myalgias and back pain.  Skin: Negative for rash and wound.  Neurological: Negative for dizziness, weakness, light-headedness, numbness and headaches.  All other systems reviewed and are negative.    Allergies  Review of patient's allergies indicates no known allergies.  Home Medications   No current outpatient prescriptions on file. BP 134/87  Pulse 67  Temp(Src) 97.8 F (36.6 C) (Oral)  Resp 16  Ht 6\' 1"  (1.854 m)  Wt 234 lb 11.2 oz (106.459 kg)  BMI 30.97 kg/m2  SpO2 100% Physical Exam  Nursing note and vitals reviewed. Constitutional: He is oriented to person, place, and time. He appears well-developed and well-nourished. No distress.  HENT:  Head: Normocephalic and atraumatic.  Mouth/Throat: Oropharynx is clear and moist. No oropharyngeal exudate.  Eyes: EOM are normal. Pupils are equal, round, and reactive to light.  Neck: Normal range of motion. Neck supple.  No nuchal rigidity  Cardiovascular: Regular rhythm.   tachycardia  Pulmonary/Chest: Effort normal. No respiratory distress. He has no wheezes. He has rales (scattered course breath sounds). He exhibits no tenderness.  Abdominal: Soft. Bowel sounds are normal. He exhibits no distension and no mass. There is tenderness (Mild epigastric TTP). There is no rebound and no guarding.  Musculoskeletal: Normal range of motion. He exhibits no edema and no tenderness.  No calf swelling or tenderness, R AV fistula with palp thrill. +mild ttp over site  Lymphadenopathy:    He has no cervical adenopathy.  Neurological: He is alert and oriented to person, place, and time.  Skin: Skin is warm and dry. No  rash noted. No erythema.  Psychiatric: He has a normal mood and affect. His behavior is normal.    ED Course  Procedures (including critical care time) Labs Review Labs Reviewed  CBC WITH DIFFERENTIAL - Abnormal; Notable for the following:    RBC 3.49 (*)    Hemoglobin 10.4 (*)    HCT 30.3 (*)    Lymphocytes Relative 8 (*)    Lymphs Abs 0.3 (*)    Monocytes Relative 13 (*)    All other components within normal limits  COMPREHENSIVE METABOLIC PANEL - Abnormal; Notable for the following:    Chloride 95 (*)    Creatinine, Ser 6.31 (*)    Alkaline Phosphatase 152 (*)    GFR calc non Af Amer 9 (*)    GFR calc Af Amer 10 (*)    All other components within normal limits  URINALYSIS, ROUTINE W REFLEX MICROSCOPIC - Abnormal; Notable for the following:    pH 8.5 (*)    Hgb urine dipstick SMALL (*)    Protein, ur >300 (*)    All other components within normal limits  CBC - Abnormal; Notable for the following:    WBC 2.4 (*)    RBC 3.05 (*)    Hemoglobin 9.1 (*)    HCT 26.9 (*)    All other components within normal limits  BASIC METABOLIC PANEL - Abnormal; Notable for the following:    Chloride 95 (*)    BUN 43 (*)    Creatinine, Ser 9.33 (*)    GFR calc non Af Amer 5 (*)    GFR calc Af Amer 6 (*)    All other components within normal limits  CULTURE, EXPECTORATED SPUTUM-ASSESSMENT  CULTURE, BLOOD (ROUTINE X 2)  CULTURE, BLOOD (ROUTINE X 2)  CULTURE, RESPIRATORY (NON-EXPECTORATED)  PROTIME-INR  APTT  URINE MICROSCOPIC-ADD ON  HEPATITIS C ANTIBODY  TROPONIN I  TROPONIN I  HIV ANTIBODY (ROUTINE TESTING)  CG4 I-STAT (LACTIC ACID)   Imaging Review Dg Chest Port 1 View  05/02/2013   *RADIOLOGY REPORT*  Clinical Data: Fever  PORTABLE CHEST - 1 VIEW  Comparison: April 27, 2013.  Findings: Stable cardiomegaly and central pulmonary vascular congestion.  No acute pulmonary disease is noted.  No pleural effusion or pneumothorax is noted.  Bony thorax is intact.  IMPRESSION: Stable  cardiomegaly and central pulmonary vascular congestion.   Original Report Authenticated By: Lupita Raider.,  M.D.    Date: 05/02/2013  Rate: 105  Rhythm: atrial flutter  QRS Axis: normal  Intervals: normal  ST/T Wave abnormalities: nonspecific T wave changes  Conduction Disutrbances:none  Narrative Interpretation:   Old EKG Reviewed: changes noted   MDM  [facility] to admit.    Loren Racer, MD 05/04/13 [phone]

## 2013-05-02 NOTE — ED Notes (Signed)
Patient here for fever.  Patient states he is a dialysis patient, had dialysis today.  Patient states that his renal MD told him to come to ED if his fever increased over 100.  Patient was started on Amoxicillin today.  Patient was discharged from hospital on Saturday.

## 2013-05-03 ENCOUNTER — Encounter (HOSPITAL_COMMUNITY): Payer: Self-pay | Admitting: General Practice

## 2013-05-03 DIAGNOSIS — Z992 Dependence on renal dialysis: Secondary | ICD-10-CM

## 2013-05-03 DIAGNOSIS — N186 End stage renal disease: Secondary | ICD-10-CM

## 2013-05-03 DIAGNOSIS — I10 Essential (primary) hypertension: Secondary | ICD-10-CM

## 2013-05-03 DIAGNOSIS — Z48812 Encounter for surgical aftercare following surgery on the circulatory system: Secondary | ICD-10-CM

## 2013-05-03 LAB — URINE MICROSCOPIC-ADD ON

## 2013-05-03 LAB — URINALYSIS, ROUTINE W REFLEX MICROSCOPIC
Glucose, UA: NEGATIVE mg/dL
Specific Gravity, Urine: 1.012 (ref 1.005–1.030)
Urobilinogen, UA: 0.2 mg/dL (ref 0.0–1.0)
pH: 8.5 — ABNORMAL HIGH (ref 5.0–8.0)

## 2013-05-03 LAB — TROPONIN I: Troponin I: <0.3 ng/mL (ref <0.30)

## 2013-05-03 MED ORDER — DARBEPOETIN ALFA-POLYSORBATE 25 MCG/0.42ML IJ SOLN
25.0000 ug | INTRAMUSCULAR | Status: DC
  Administered 2013-05-04 – 2013-05-06 (×2): 25 ug via INTRAVENOUS
  Filled 2013-05-03: qty 0.42

## 2013-05-03 MED ORDER — DOXERCALCIFEROL 4 MCG/2ML IV SOLN
1.0000 ug | INTRAVENOUS | Status: DC
  Administered 2013-05-04 – 2013-05-06 (×3): 1 ug via INTRAVENOUS
  Filled 2013-05-03 (×2): qty 2

## 2013-05-03 MED ORDER — SODIUM CHLORIDE 0.9 % IJ SOLN: 3.0000 mL | INTRAMUSCULAR | Status: DC | PRN

## 2013-05-03 MED ORDER — SEVELAMER CARBONATE 800 MG PO TABS
4000.0000 mg | ORAL_TABLET | Freq: Three times a day (TID) | ORAL | Status: DC
  Administered 2013-05-03 (×3): 4000 mg via ORAL
  Filled 2013-05-03 (×4): qty 5

## 2013-05-03 MED ORDER — ASPIRIN EC 81 MG PO TBEC
81.0000 mg | DELAYED_RELEASE_TABLET | Freq: Every day | ORAL | Status: DC
  Administered 2013-05-03: 81 mg via ORAL
  Filled 2013-05-03: qty 1

## 2013-05-03 MED ORDER — ASPIRIN EC 325 MG PO TBEC
325.0000 mg | DELAYED_RELEASE_TABLET | Freq: Every day | ORAL | Status: DC
  Administered 2013-05-05 – 2013-05-07 (×3): 325 mg via ORAL
  Filled 2013-05-03 (×4): qty 1

## 2013-05-03 MED ORDER — VANCOMYCIN HCL IN DEXTROSE 1-5 GM/200ML-% IV SOLN
1000.0000 mg | INTRAVENOUS | Status: DC
  Administered 2013-05-06: 1000 mg via INTRAVENOUS
  Filled 2013-05-03 (×4): qty 200

## 2013-05-03 MED ORDER — SODIUM CHLORIDE 0.9 % IV SOLN
250.0000 mL | INTRAVENOUS | Status: DC | PRN
  Administered 2013-05-04: 08:00:00 via INTRAVENOUS

## 2013-05-03 MED ORDER — HYDROCODONE-ACETAMINOPHEN 5-325 MG PO TABS
1.0000 | ORAL_TABLET | Freq: Once | ORAL | Status: AC
  Administered 2013-05-03: 1 via ORAL
  Filled 2013-05-03: qty 1

## 2013-05-03 MED ORDER — LABETALOL HCL 200 MG PO TABS
200.0000 mg | ORAL_TABLET | Freq: Two times a day (BID) | ORAL | Status: DC
  Administered 2013-05-03 – 2013-05-07 (×8): 200 mg via ORAL
  Filled 2013-05-03 (×11): qty 1

## 2013-05-03 MED ORDER — SODIUM CHLORIDE 0.9 % IJ SOLN
3.0000 mL | Freq: Two times a day (BID) | INTRAMUSCULAR | Status: DC
  Administered 2013-05-04: 3 mL via INTRAVENOUS

## 2013-05-03 MED ORDER — CEFAZOLIN SODIUM-DEXTROSE 2-3 GM-% IV SOLR
2.0000 g | INTRAVENOUS | Status: AC
  Administered 2013-05-04: 2 g via INTRAVENOUS
  Filled 2013-05-03: qty 50

## 2013-05-03 MED ORDER — HYDROCODONE-ACETAMINOPHEN 5-325 MG PO TABS
1.0000 | ORAL_TABLET | Freq: Four times a day (QID) | ORAL | Status: DC | PRN
  Administered 2013-05-03 – 2013-05-05 (×4): 1 via ORAL
  Filled 2013-05-03 (×4): qty 1

## 2013-05-03 MED ORDER — SEVELAMER CARBONATE 800 MG PO TABS
4000.0000 mg | ORAL_TABLET | Freq: Three times a day (TID) | ORAL | Status: DC
  Administered 2013-05-04 – 2013-05-07 (×8): 4000 mg via ORAL
  Filled 2013-05-03 (×13): qty 5

## 2013-05-03 MED ORDER — HEPARIN SODIUM (PORCINE) 5000 UNIT/ML IJ SOLN
5000.0000 [IU] | Freq: Three times a day (TID) | INTRAMUSCULAR | Status: DC
  Filled 2013-05-03 (×16): qty 1

## 2013-05-03 MED ORDER — SEVELAMER CARBONATE 800 MG PO TABS: 4000.0000 mg | ORAL_TABLET | Freq: Three times a day (TID) | ORAL | Status: DC

## 2013-05-03 MED ORDER — SODIUM CHLORIDE 0.9 % IJ SOLN
3.0000 mL | Freq: Two times a day (BID) | INTRAMUSCULAR | Status: DC
  Administered 2013-05-03 – 2013-05-05 (×4): 3 mL via INTRAVENOUS

## 2013-05-03 MED ORDER — SEVELAMER CARBONATE 800 MG PO TABS
4000.0000 mg | ORAL_TABLET | Freq: Three times a day (TID) | ORAL | Status: DC | PRN
  Administered 2013-05-03 – 2013-05-06 (×2): 4000 mg via ORAL
  Filled 2013-05-03: qty 5

## 2013-05-03 MED ORDER — AMLODIPINE BESYLATE 10 MG PO TABS
10.0000 mg | ORAL_TABLET | Freq: Every day | ORAL | Status: DC
  Administered 2013-05-03 – 2013-05-07 (×4): 10 mg via ORAL
  Filled 2013-05-03 (×5): qty 1

## 2013-05-03 MED ORDER — VANCOMYCIN HCL 10 G IV SOLR
2000.0000 mg | Freq: Once | INTRAVENOUS | Status: AC
  Administered 2013-05-03: 2000 mg via INTRAVENOUS
  Filled 2013-05-03: qty 2000

## 2013-05-03 NOTE — Progress Notes (Addendum)
[facility] PROGRESS NOTE  Trevor [name] FAO:[id number] DOB: [birth year] DOA: 05/02/2013 PCP: No PCP Per Patient  Assessment/Plan: Fever: likely due to infection of AVF in LUE .  -CXR and UA neg for infection -follow blood, urine and sputum culture  -I have consulted for further recs, will likely need vascular for surgical intervention -Continue IV vancomycin - have pharmacy to dose ESRD:  -MWF dialysis, I have consulted nephrology for dialysis A Fib/Flutter with RVR: RVR may be related to febrile illness.  -new onset, may have been precipitated by febrile illness -will also cycle CEs, had recent echo on 8/22 with EF 50% to 55%, no wall motion abnl and moderately dilated LA-  Will hold off another echo at this time - continue to monitor on tele  - will hold off on further rate control meds, as his rate is currently well controlled  - his CHADS2 score is 1 and options include full dose ASA vs coumadin. Will continue ASA-change to full dose. -follow and consider cards consult pending enzymes and linical course Anemia:  - stable  HTN:  - continue home meds   Code Status: full Family Communication: wife at bedside Disposition Plan: likely to home when medically stable   Consultants:  Renal  Procedures:  ECHO 8/22 Study Conclusions  - Left ventricle: The cavity size was mildly to moderately dilated. Wall thickness was increased in a pattern of mild LVH. Systolic function was normal. The estimated ejection fraction was in the range of 50% to 55%. Wall motion was normal; there were no regional wall motion abnormalities. - Aortic valve: Sclerosis without stenosis. No significant regurgitation. - Aorta: Aortic root dimension: 33mm (ED). - Left atrium: The atrium was moderately dilated. - Right ventricle: The cavity size was mildly dilated. Systolic function was mildly reduced. - Right atrium: The atrium was mildly dilated. - Pulmonary arteries: PA peak pressure: 49mm Hg  (S). - Pericardium, extracardiac: A trivial pericardial effusion was identified posterior to the heart.    Antibiotics:  vancomycin started 8/25  HPI/Subjective:   Objective: Filed Vitals:   05/03/13 0431  BP: 144/87  Pulse: 64  Temp: 98.2 F (36.8 C)  Resp: 18   No intake or output data in the 24 hours ending 05/03/13 1129 Filed Weights   05/02/13 2350  Weight: 105 kg (231 lb 7.7 oz)    Exam:  General: alert & oriented x 3 In NAD Cardiovascular: RRR, nl S1 s2 Respiratory: CTAB Abdomen: soft +BS NT/ND, no masses palpable Extremities: RUE AV fistula with 2 mildly ulcerated yellowish nodular lesions, with surrounding tenderness.No cyanosis and no edema    Data Reviewed: Basic Metabolic Panel:  Recent Labs Lab 04/28/13 0545 04/29/13 0500 04/29/13 0732 04/30/13 0350 05/02/13 2123  NA 137 137 136 137 137  K 3.8 3.7 4.0 3.9 3.9  CL 97 97 97 97 95*  CO2 29 29 27 28 31   GLUCOSE 81 82 84 75 71  BUN 25* 35* 37* 22 23  CREATININE 7.12* 9.24* 9.81* 6.46* 6.31*  CALCIUM 9.6 9.6 9.8 9.6 9.8  MG  --  2.4  --  2.2  --   PHOS  --   --  3.9  --   --    Liver Function Tests:  Recent Labs Lab 04/29/13 0500 04/29/13 0732 04/30/13 0350 05/02/13 2123  AST 16  --  24 26  ALT 16  --  18 17  ALKPHOS 129*  --  146* 152*  BILITOT 0.5  --  0.5 0.8  PROT 7.4  --  7.7 8.2  ALBUMIN 3.2* 3.3* 3.3* 3.5   No results found for this basename: LIPASE, AMYLASE,  in the last 168 hours No results found for this basename: AMMONIA,  in the last 168 hours CBC:  Recent Labs Lab 04/27/13 1049 04/28/13 0545 04/29/13 0732 05/02/13 2123  WBC 3.1* 2.8* 2.6* 4.1  NEUTROABS  --   --   --  3.2  HGB 9.3* 9.1* 9.3* 10.4*  HCT 27.0* 27.3* 27.5* 30.3*  MCV 88.2 88.6 88.1 86.8  PLT 158 171 165 193   Cardiac Enzymes: No results found for this basename: CKTOTAL, CKMB, CKMBINDEX, TROPONINI,  in the last 168 hours BNP (last 3 results) No results found for this basename: PROBNP,  in the  last 8760 hours CBG:  Recent Labs Lab 04/27/13 1116  GLUCAP 86    Recent Results (from the past 240 hour(s))  CULTURE, EXPECTORATED SPUTUM-ASSESSMENT     Status: None   Collection Time    05/03/13 10:07 AM      Result Value Range Status   Specimen Description SPUTUM   Final   Special Requests Normal   Final   Sputum evaluation     Final   Value: THIS SPECIMEN IS ACCEPTABLE. RESPIRATORY CULTURE REPORT TO FOLLOW.   Report Status 05/03/2013 FINAL   Final     Studies: Dg Chest Port 1 View  05/02/2013   *RADIOLOGY REPORT*  Clinical Data: Fever  PORTABLE CHEST - 1 VIEW  Comparison: April 27, 2013.  Findings: Stable cardiomegaly and central pulmonary vascular congestion.  No acute pulmonary disease is noted.  No pleural effusion or pneumothorax is noted.  Bony thorax is intact.  IMPRESSION: Stable cardiomegaly and central pulmonary vascular congestion.   Original Report Authenticated By: Lupita Raider.,  M.D.    Scheduled Meds: . amLODipine  10 mg Oral Daily  . aspirin EC  81 mg Oral Daily  . heparin  5,000 Units Subcutaneous Q8H  . labetalol  200 mg Oral BID  . sevelamer carbonate  4,000 mg Oral TID WC  . sodium chloride  3 mL Intravenous Q12H  . sodium chloride  3 mL Intravenous Q12H   Continuous Infusions:   Active Problems:   * No active hospital problems. *    Time spent: 20    [facility]  [facility] Pager [phone]. If 7PM-7AM, please contact night-coverage at [email or web address], password [email or web address] 05/03/2013, 11:29 AM  LOS: 1 day

## 2013-05-03 NOTE — Progress Notes (Signed)
Pt very upset that Renvela is not ordered per request. Pt says that he takes this med before any meal or snack.Pt was instructed that the next dose is scheduled for 0700. Pt said that he will call his wife and tell her to bring his bottle because nursing staff will not give it to him. Pt educated that nurses do not just give medications out without orders. Will call MD on call.   Horice Carrero M

## 2013-05-03 NOTE — Consult Note (Signed)
[facility] [place] Consultation  Reason for consult: infected AV fistula with bleeding Requesting: Rob Schertz, MD History of Present Illness:  Trevor [name] is a 62 y.o. year old male who presents for evaluation of possible right arm AV fistula infection and bleeding from fistula.  Fistula placed 3 years ago.  No significant problems.  Has had several bleeding episodes recently and one where hemoglobin went from 12-9.  Has had chronic cough over the last few weeks and also had some hemoptysis.  Chest xray negative for pneumonia.  During this hospital admission had fever of 103 and has also had some fevers after dialysis sessions.  Other medical problems include atrial fibrillation, hypertension which are currently controlled.  No current diagnosis for his chronic cough.  Past Medical History  Diagnosis Date  . Anemia   . AVF (arteriovenous fistula)     Left  . Secondary hyperparathyroidism   . Hypovitaminosis D   . Hypertensive urgency     H/o  . CHF (congestive heart failure)   . Exertional shortness of breath     "related to infection in my lungs right now" (05/03/2013)  . History of gout     "before I started doing the dialysis" (05/03/2013)  . ESRD (end stage renal disease) on dialysis     "[facility]; MWF" (05/03/2013)    Past Surgical History  Procedure Laterality Date  . Av fistula placement Left     Dr. Feilds; "I've had 2 on the left' (05/03/2013)  . Av fistula placement Right ~ 2011  . Knee arthroscopy Left      Social History History  Substance Use Topics  . Smoking status: Former Smoker -- 0.25 packs/day for .5 years    Types: Cigarettes  . Smokeless tobacco: Never Used     Comment: 05/03/2013 "stopped smoking ~ 40 yr ago"  . Alcohol Use: Yes     Comment: 05/03/2013 "haven't had a glass of wine in ~ 3 months or so; sometimes will have one w/dinner"    Family History Family History  Problem Relation Age of Onset  . Hypertension Father   .  Kidney disease Father   . Allergies Father     Allergies  No Known Allergies   Current Facility-Administered Medications  Medication Dose Route Frequency Provider Last Rate Last Dose  . 0.9 %  sodium chloride infusion  250 mL Intravenous PRN Manrique Alvarez, MD      . acetaminophen (TYLENOL) tablet 650 mg  650 mg Oral Q6H PRN David Yelverton, MD   650 mg at 05/02/13 2135  . amLODipine (NORVASC) tablet 10 mg  10 mg Oral Daily Manrique Alvarez, MD   10 mg at 05/03/13 1001  . aspirin EC tablet 81 mg  81 mg Oral Daily Manrique Alvarez, MD   81 mg at 05/03/13 1001  . [START ON 05/04/2013] darbepoetin (ARANESP) injection 25 mcg  25 mcg Intravenous Q7 days Bridget Mary Whelan, NP      . [START ON 05/04/2013] doxercalciferol ([name]) injection 1 mcg  1 mcg Intravenous Q M,W,F-HD Bridget Mary Whelan, NP      . heparin injection 5,000 Units  5,000 Units Subcutaneous Q8H Manrique Alvarez, MD      . labetalol (NORMODYNE) tablet 200 mg  200 mg Oral BID Manrique Alvarez, MD   200 mg at 05/03/13 1001  . sevelamer carbonate (RENVELA) tablet 4,000 mg  4,000 mg Oral TID WC Manrique Alvarez, MD   4,000 mg at 05/03/13 1159  .   sodium chloride 0.9 % injection 3 mL  3 mL Intravenous Q12H Manrique Alvarez, MD      . sodium chloride 0.9 % injection 3 mL  3 mL Intravenous Q12H Manrique Alvarez, MD      . sodium chloride 0.9 % injection 3 mL  3 mL Intravenous PRN Manrique Alvarez, MD        ROS:   General:  No weight loss, Fever, chills  HEENT: No recent headaches, no nasal bleeding, no visual changes, no sore throat  Neurologic: No dizziness, blackouts, seizures. No recent symptoms of stroke or mini- stroke. No recent episodes of slurred speech, or temporary blindness.  Cardiac: No recent episodes of chest pain/pressure, no shortness of breath at rest.  +shortness of breath with exertion.  + history of atrial fibrillation or irregular heartbeat  Vascular: No history of rest pain in feet.  No history of  claudication.  No history of non-healing ulcer, No history of DVT   Pulmonary: No home oxygen, + productive cough, + hemoptysis,  No asthma or wheezing  Musculoskeletal:  [ ] Arthritis, [ ] Low back pain,  [ ] Joint pain  Hematologic:No history of hypercoagulable state.  No history of easy bleeding.  + history of anemia  Gastrointestinal: No hematochezia or melena,  No gastroesophageal reflux, no trouble swallowing  Urinary: [ ] chronic Kidney disease, [x ] on HD - [ ] MWF or [ ] TTHS, [ ] Burning with urination, [ ] Frequent urination, [ ] Difficulty urinating;   Skin: No rashes  Psychological: No history of anxiety,  No history of depression   Physical Examination  Filed Vitals:   05/02/13 2245 05/02/13 2315 05/02/13 2350 05/03/13 0431  BP: 145/90  139/88 144/87  Pulse: 102  84 64  Temp:  100.1 F (37.8 C) 100 F (37.8 C) 98.2 F (36.8 C)  TempSrc:  Oral Oral Oral  Resp: 33  20 18  Height:   6' 1" (1.854 m)   Weight:   231 lb 7.7 oz (105 kg)   SpO2: 97%  94% 96%    Body mass index is 30.55 kg/(m^2).  General:  Alert and oriented, no acute distress HEENT: Normal Neck: No bruit or JVD Pulmonary: Clear to auscultation bilaterally Cardiac: Regular Rate and Rhythm Abdomen: Soft, non-tender, non-distended Skin: No rash, button hole cannulation site proximal right upper arm fistula 2-3 cm diameter with some erosion on surface and surrounding yellowish blister, upper cannulation site also with some minor ulceration, fistula tortuous with easily palpable thrill Extremity Pulses:  2+ radial, brachial,pulses bilaterally, occluded left upper arm and forearm fistulae non tender no erythema Musculoskeletal: No deformity or edema  Neurologic: Upper and lower extremity motor 5/5 and symmetric  DATA: Chest xray no pneumonia, ECHO normal EF with no obvious vegetations on transthoracic   ASSESSMENT: Fever with bleeding episodes right AVF no other source for fever currently.  Erosions  are high risk for significant bleed.  Would not reconstruct fistula in light of possible infection   PLAN:  Ligate right arm AVF with excision of proximal half and placement of Diatek tomorrow.  Will also check HIV and hep C.  Will defer placement of PPD and further pulmonary work up to primary team.  NPO  Post midnight.  Ancef on call.  Ramar Nobrega, MD [facility]  Office: [phone] Pager: [phone]  

## 2013-05-03 NOTE — Care Management Note (Unsigned)
    Page 1 of 1   05/03/2013     3:54:59 PM   CARE MANAGEMENT NOTE 05/03/2013  Patient:  Trevor [name], Trevor [name]   Account Number:  [id number]  Date Initiated:  05/03/2013  Documentation initiated by:  Matei Magnone  Subjective/Objective Assessment:   PT ADM ON 05/02/13 WITH FEVER OF 103 AND BLEEDING AV FISTULA.  PTA, PT INDEPENDENT, LIVES WITH SPOUSE.     Action/Plan:   WILL FOLLOW FOR HOME NEEDS AS PT PROGRESSES.   Anticipated DC Date:  05/06/2013   Anticipated DC Plan:  HOME W HOME HEALTH SERVICES      DC Planning Services  CM consult      Choice offered to / List presented to:             Status of service:  In process, will continue to follow Medicare Important Message given?   (If response is "NO", the following Medicare IM given date fields will be blank) Date Medicare IM given:   Date Additional Medicare IM given:    Discharge Disposition:    Per UR Regulation:  Reviewed for med. necessity/level of care/duration of stay  If discussed at Long Length of Stay Meetings, dates discussed:    Comments:

## 2013-05-03 NOTE — Progress Notes (Signed)
VASCULAR LAB PRELIMINARY  PRELIMINARY  PRELIMINARY  PRELIMINARY  Duplex of right upper extremity AVF completed.    Preliminary report:  AVF patent.  No evidence of perigraft fluid or sludge.    Davied Nocito, RVT 05/03/2013, 2:06 PM

## 2013-05-03 NOTE — Progress Notes (Signed)
ANTIBIOTIC CONSULT NOTE - INITIAL  Pharmacy Consult for vancomycin Indication: suspected endovascular infection  No Known Allergies  Patient Measurements: Height: 6\' 1"  (185.4 cm) Weight: 231 lb 7.7 oz (105 kg) IBW/kg (Calculated) : 79.9   Vital Signs: Temp: 98.8 F (37.1 C) (08/26 2031) Temp src: Oral (08/26 2031) BP: 128/85 mmHg (08/26 2031) Pulse Rate: 94 (08/26 2031) Intake/Output from previous day:   Intake/Output from this shift:    Labs:  Recent Labs  05/02/13 2123  WBC 4.1  HGB 10.4*  PLT 193  CREATININE 6.31*   Estimated Creatinine Clearance: 15.8 ml/min (by C-G formula based on Cr of 6.31). No results found for this basename: VANCOTROUGH, Leodis Binet, VANCORANDOM, GENTTROUGH, GENTPEAK, GENTRANDOM, TOBRATROUGH, TOBRAPEAK, TOBRARND, AMIKACINPEAK, AMIKACINTROU, AMIKACIN,  in the last 72 hours   Microbiology: Recent Results (from the past 720 hour(s))  CULTURE, EXPECTORATED SPUTUM-ASSESSMENT     Status: None   Collection Time    05/03/13 10:07 AM      Result Value Range Status   Specimen Description SPUTUM   Final   Special Requests Normal   Final   Sputum evaluation     Final   Value: THIS SPECIMEN IS ACCEPTABLE. RESPIRATORY CULTURE REPORT TO FOLLOW.   Report Status 05/03/2013 FINAL   Final    Medical History: Past Medical History  Diagnosis Date  . Anemia   . AVF (arteriovenous fistula)     Left  . Secondary hyperparathyroidism   . Hypovitaminosis D   . Hypertensive urgency     H/o  . CHF (congestive heart failure)   . Exertional shortness of breath     "related to infection in my lungs right now" (05/03/2013)  . History of gout     "before I started doing the dialysis" (05/03/2013)  . ESRD (end stage renal disease) on dialysis     "[facility]; MWF" (05/03/2013)    Medications:  Prescriptions prior to admission  Medication Sig Dispense Refill  . amLODipine (NORVASC) 10 MG tablet Take 10 mg by mouth daily.        . Arginine 1000 MG TABS  Take 2,000 mg by mouth daily.      Marland Kitchen aspirin EC 81 MG tablet Take 1 tablet (81 mg total) by mouth daily.      Marland Kitchen labetalol (TRANDATE) 200 MG tablet Take 200 mg by mouth 2 (two) times daily.        . sevelamer carbonate (RENVELA) 800 MG tablet Take 4,000 mg by mouth 3 (three) times daily with meals.       Assessment: 62 year old man admitted for fever and possible infected AVF to continue on vancomycin.  Vancomycin 2g was started today.  Goal of Therapy:  Pre HD level 15-25  Plan:  Vancomycin 1g after HD (on MWF schedule) Monitor cultures with medical team.  Mickeal Skinner 05/03/2013,9:35 PM

## 2013-05-03 NOTE — Consult Note (Signed)
Indication for Consultation:  Management of ESRD/hemodialysis; anemia, hypertension/volume and secondary hyperparathyroidism  HPI: Trevor [name] is a 62 y.o. male who was admitted last night for fever and possible infected AVF s/p HD, he was recently admitted 8/20 for a syncopal episode and afib found on EKG, he was started on ASA. He receives HD MWF at [facility] and ran a full treatment yesterday. He states he was felling well prior to and during HD but developed a fever when he got home, he also noted that he had significant redness around his buttonhole- which he now reports is much improved. He denies any purulent drainage, states just bloody. He reports fever post HD for a month, he also reports a cough with some blood tinged sputum- he was given script for amoxicillin yesterday at HD and took 1 dose. He denies any chest pain, sob, dizzy, lightheaded or more syncopal episodes. He has been having bleeding from the buttonholes of his AVF for a month, he states when he coughs the blood sometimes comes 'shooting out'. He state the button holes are no longer being accessed d/t pain and bleeding- HD heparin dose was decreased last admission.   Past Medical History  Diagnosis Date  . Anemia   . AVF (arteriovenous fistula)     Left  . Secondary hyperparathyroidism   . Hypovitaminosis D   . Hypertensive urgency     H/o  . CHF (congestive heart failure)   . Exertional shortness of breath     "related to infection in my lungs right now" (05/03/2013)  . History of gout     "before I started doing the dialysis" (05/03/2013)  . ESRD (end stage renal disease) on dialysis     "[facility]; MWF" (05/03/2013)   Past Surgical History  Procedure Laterality Date  . Av fistula placement Left     Dr. Charlean Sanfilippo; "I've had 2 on the left' (05/03/2013)  . Av fistula placement Right ~ 2011  . Knee arthroscopy Left    Family History  Problem Relation Age of Onset  . Hypertension Father   . Kidney disease Father    . Allergies Father    Social History:  reports that he has quit smoking. His smoking use included Cigarettes. He has a .125 pack-year smoking history. He has never used smokeless tobacco. He reports that  drinks alcohol. He reports that he does not use illicit drugs. No Known Allergies Prior to Admission medications   Medication Sig Start Date End Date Taking? Authorizing Provider  amLODipine (NORVASC) 10 MG tablet Take 10 mg by mouth daily.     Yes Historical Provider, MD  Arginine 1000 MG TABS Take 2,000 mg by mouth daily.   Yes Historical Provider, MD  aspirin EC 81 MG tablet Take 1 tablet (81 mg total) by mouth daily. 04/30/13  Yes Clydia Llano, MD  labetalol (TRANDATE) 200 MG tablet Take 200 mg by mouth 2 (two) times daily.     Yes Historical Provider, MD  sevelamer carbonate (RENVELA) 800 MG tablet Take 4,000 mg by mouth 3 (three) times daily with meals.   Yes Historical Provider, MD   Current Facility-Administered Medications  Medication Dose Route Frequency Provider Last Rate Last Dose  . 0.9 %  sodium chloride infusion  250 mL Intravenous PRN Mariea Stable, MD      . acetaminophen (TYLENOL) tablet 650 mg  650 mg Oral Q6H PRN Loren Racer, MD   650 mg at 05/02/13 2135  . amLODipine (NORVASC) tablet 10  mg  10 mg Oral Daily Mariea Stable, MD   10 mg at 05/03/13 1001  . aspirin EC tablet 81 mg  81 mg Oral Daily Mariea Stable, MD   81 mg at 05/03/13 1001  . heparin injection 5,000 Units  5,000 Units Subcutaneous Q8H Mariea Stable, MD      . labetalol (NORMODYNE) tablet 200 mg  200 mg Oral BID Mariea Stable, MD   200 mg at 05/03/13 1001  . sevelamer carbonate (RENVELA) tablet 4,000 mg  4,000 mg Oral TID WC Mariea Stable, MD   4,000 mg at 05/03/13 1159  . sodium chloride 0.9 % injection 3 mL  3 mL Intravenous Q12H Mariea Stable, MD      . sodium chloride 0.9 % injection 3 mL  3 mL Intravenous Q12H Mariea Stable, MD      . sodium chloride 0.9 % injection 3 mL  3  mL Intravenous PRN Mariea Stable, MD       Labs: Basic Metabolic Panel:  Recent Labs Lab 04/29/13 0732 04/30/13 0350 05/02/13 2123  NA 136 137 137  K 4.0 3.9 3.9  CL 97 97 95*  CO2 27 28 31   GLUCOSE 84 75 71  BUN 37* 22 23  CREATININE 9.81* 6.46* 6.31*  CALCIUM 9.8 9.6 9.8  PHOS 3.9  --   --    Liver Function Tests:  Recent Labs Lab 04/29/13 0500 04/29/13 0732 04/30/13 0350 05/02/13 2123  AST 16  --  24 26  ALT 16  --  18 17  ALKPHOS 129*  --  146* 152*  BILITOT 0.5  --  0.5 0.8  PROT 7.4  --  7.7 8.2  ALBUMIN 3.2* 3.3* 3.3* 3.5   No results found for this basename: LIPASE, AMYLASE,  in the last 168 hours No results found for this basename: AMMONIA,  in the last 168 hours CBC:  Recent Labs Lab 04/27/13 1049 04/28/13 0545 04/29/13 0732 05/02/13 2123  WBC 3.1* 2.8* 2.6* 4.1  NEUTROABS  --   --   --  3.2  HGB 9.3* 9.1* 9.3* 10.4*  HCT 27.0* 27.3* 27.5* 30.3*  MCV 88.2 88.6 88.1 86.8  PLT 158 171 165 193   Cardiac Enzymes: No results found for this basename: CKTOTAL, CKMB, CKMBINDEX, TROPONINI,  in the last 168 hours CBG:  Recent Labs Lab 04/27/13 1116  GLUCAP 86   Iron Studies: No results found for this basename: IRON, TIBC, TRANSFERRIN, FERRITIN,  in the last 72 hours Studies/Results: Dg Chest Port 1 View  05/02/2013   *RADIOLOGY REPORT*  Clinical Data: Fever  PORTABLE CHEST - 1 VIEW  Comparison: April 27, 2013.  Findings: Stable cardiomegaly and central pulmonary vascular congestion.  No acute pulmonary disease is noted.  No pleural effusion or pneumothorax is noted.  Bony thorax is intact.  IMPRESSION: Stable cardiomegaly and central pulmonary vascular congestion.   Original Report Authenticated By: Lupita Raider.,  M.D.    ROS:  Review of Systems: Gen: Reports fever post HD. Denies fatigue, weakness, malaise, weight loss HEENT: No visual complaints CV: Denies chest pain,palpitations, syncope, orthopnea,  peripheral edema. Resp: Denies sob  and wheezing. Reports cough with blood tinged sputum. GI: Denies n/v/d and abd pain. Reports good appetite. MS: Denies joint pain Denies muscle weakness, cramps Heme: Reports bleeding from AVF. Denies enlarged lymph nodes. Neuro: No headache.   No paresthesias.  No weakness. Endocrine No DM.  Marland Kitchen  Physical Exam: Filed Vitals:   05/02/13 2245 05/02/13 2315  05/02/13 2350 05/03/13 0431  BP: 145/90  139/88 144/87  Pulse: 102  84 64  Temp:  100.1 F (37.8 C) 100 F (37.8 C) 98.2 F (36.8 C)  TempSrc:  Oral Oral Oral  Resp: 33  20 18  Height:   6\' 1"  (1.854 m)   Weight:   105 kg (231 lb 7.7 oz)   SpO2: 97%  94% 96%     General: Well developed, well nourished, in no acute distress. Appears comfortable Head: Normocephalic, atraumatic, mucus membranes are moist Neck: Supple. JVD not elevated. No carotid bruits Lungs: Clear bilaterally to auscultation without wheezes, rales, or rhonchi. Breathing is unlabored. Heart: RRR, No murmurs, rubs, or gallops appreciated- tachycardic Abdomen: Soft, non-tender, non-distended +BSx4 M-S:  Strength and tone appear normal for age. Lower extremities:without edema or ischemic changes, no open wounds  Neuro: Alert and oriented X 3. Moves all extremities spontaneously. Psych:  Responds to questions appropriately with a normal affect. Dialysis Access: RUA AVF + bruit and thrill.  Small amount sanguinous drainage oozing at both button hole sites, warm to touch, slight erythema, + tenderness at [facility] sites  Dialysis Orders: MWF @ [facility]. 4 hr 30 mins  104.5 kg  2K/2.25ca+ Heparin 3200u. RUA AVF 400 BFR 800 DFR  Profile 2 1 mcg hectorol IV/HD   Epogen 3000 Units IV/HD  NoVenofer    Assessment/Plan: 1. Fever / possible infected AVF: WBC 4.1 Tmax 103 last night, afebrile now 98.2. Vanc 1 gram given per primary team. Dialysis access duplex- no perigraft fluid or sludge, AVF patent. Blood and sputum cultures pending. Chest xray- No acute pulmonary disease 2. [place] 3.9. HD pending for tomorrow and vasc surgery eval of fistula 3. Hypertension/volume  - BP 144/87, norvasc 10mg . no volume excess 4. Anemia  - hgb 10.4 on 3000u Epo out pt, cont ESA 5. Metabolic bone disease -  Ca+ 9.8, phos 3.5, pth 213(7/23) renvela with meals. 1 mcg hectorol 6. Nutrition - Alb 3.5. Renal diet. Encourage protein 7. Afib- ASA and labetalol. on tele   Jetty Duhamel, NP [facility] [phone] 05/03/2013, 2:46 PM   Patient seen and examined.  I agree with assessment and plan as above with additions as indicated.  ESRD patient with HTN on HD presenting with 3-4 week hx of painful, tender buttonhole sites on AVF. Also has been having intermittent fever spikes at home which is happening only in the evenings after dialysis sessions. More recently has started to have spontaneous bleeding from [facility] sites as well.  No outpt blood cx's have been done, he was prescribed course of po abx last week (amoxicillin) without benefit.  Admitted with temp 103.  Exam as above, suspect infected AVF locally at [facility] sites.  This has been reported with increasing frequency in the literature. On IV abx.  We have asked Dr Darrick Penna to assess the situation.  Will follow [name]  MD Pager [phone]    Cell  [phone] 05/04/2013, 9:12 AM

## 2013-05-03 NOTE — Progress Notes (Signed)
Attending MD paged and aware, Pt periodically bleeding from Right arm Fistula. Dry dressing applied. Pt complains of sharp pains in Right arm at fistula site. Pt requesting pain medication. One time order given for Vicodin. Pt Medicated, see MAR. Will continue to monitor.

## 2013-05-04 ENCOUNTER — Inpatient Hospital Stay (HOSPITAL_COMMUNITY): Payer: Medicare Other

## 2013-05-04 ENCOUNTER — Encounter (HOSPITAL_COMMUNITY)
Admission: EM | Disposition: A | Discharge status: Home / Self Care | Payer: Self-pay | Source: Home / Self Care | Attending: Internal Medicine

## 2013-05-04 ENCOUNTER — Encounter (HOSPITAL_COMMUNITY): Payer: Self-pay | Admitting: Anesthesiology

## 2013-05-04 ENCOUNTER — Inpatient Hospital Stay (HOSPITAL_COMMUNITY): Payer: Medicare Other | Admitting: Anesthesiology

## 2013-05-04 DIAGNOSIS — R509 Fever, unspecified: Secondary | ICD-10-CM | POA: Diagnosis present

## 2013-05-04 DIAGNOSIS — T82898A Other specified complication of vascular prosthetic devices, implants and grafts, initial encounter: Secondary | ICD-10-CM

## 2013-05-04 HISTORY — PX: INSERTION OF DIALYSIS CATHETER: SHX1324

## 2013-05-04 HISTORY — PX: AVGG REMOVAL: SHX5153

## 2013-05-04 LAB — CBC
Hemoglobin: 8.9 g/dL — ABNORMAL LOW (ref 13.0–17.0)
MCH: 29.8 pg (ref 26.0–34.0)
MCHC: 33.8 g/dL (ref 30.0–36.0)
MCHC: 33.6 g/dL (ref 30.0–36.0)
Platelets: 174 10*3/uL (ref 150–400)
RBC: 3.05 MIL/uL — ABNORMAL LOW (ref 4.22–5.81)
RBC: 3.01 MIL/uL — ABNORMAL LOW (ref 4.22–5.81)

## 2013-05-04 LAB — BASIC METABOLIC PANEL
Calcium: 9.7 mg/dL (ref 8.4–10.5)
GFR calc Af Amer: 6 mL/min — ABNORMAL LOW (ref >90)
GFR calc non Af Amer: 5 mL/min — ABNORMAL LOW (ref >90)
Sodium: 135 mEq/L (ref 135–145)

## 2013-05-04 LAB — RENAL FUNCTION PANEL
CO2: 29 mEq/L (ref 19–32)
GFR calc Af Amer: 6 mL/min — ABNORMAL LOW (ref >90)
Glucose, Bld: 94 mg/dL (ref 70–99)
Phosphorus: 4.6 mg/dL (ref 2.3–4.6)
Potassium: 4.2 mEq/L (ref 3.5–5.1)
Sodium: 134 mEq/L — ABNORMAL LOW (ref 135–145)

## 2013-05-04 LAB — HEPATITIS C ANTIBODY: HCV Ab: NEGATIVE

## 2013-05-04 LAB — TROPONIN I: Troponin I: <0.3 ng/mL (ref <0.30)

## 2013-05-04 SURGERY — REMOVAL OF ARTERIOVENOUS GORETEX GRAFT (AVGG)
Anesthesia: General | Site: Neck | Laterality: Right | Wound class: Dirty or Infected

## 2013-05-04 MED ORDER — PHENOL 1.4 % MT LIQD
1.0000 | OROMUCOSAL | Status: DC | PRN
  Filled 2013-05-04 (×2): qty 177

## 2013-05-04 MED ORDER — HYDROMORPHONE HCL PF 1 MG/ML IJ SOLN
INTRAMUSCULAR | Status: AC
  Administered 2013-05-04: 0.25 mg via INTRAVENOUS
  Filled 2013-05-04: qty 1

## 2013-05-04 MED ORDER — ONDANSETRON HCL 4 MG/2ML IJ SOLN
INTRAMUSCULAR | Status: DC | PRN
  Administered 2013-05-04: 4 mg via INTRAVENOUS

## 2013-05-04 MED ORDER — ONDANSETRON HCL 4 MG/2ML IJ SOLN
INTRAMUSCULAR | Status: AC
  Filled 2013-05-04: qty 2

## 2013-05-04 MED ORDER — IOHEXOL 300 MG/ML  SOLN: INTRAMUSCULAR | Status: DC | PRN

## 2013-05-04 MED ORDER — OXYCODONE HCL 5 MG/5ML PO SOLN: 5.0000 mg | Freq: Once | ORAL | Status: DC | PRN

## 2013-05-04 MED ORDER — SODIUM CHLORIDE 0.9 % IV SOLN: 100.0000 mL | INTRAVENOUS | Status: DC | PRN

## 2013-05-04 MED ORDER — NEPRO/CARBSTEADY PO LIQD
237.0000 mL | ORAL | Status: DC | PRN
  Filled 2013-05-04: qty 237

## 2013-05-04 MED ORDER — HYDROMORPHONE HCL PF 1 MG/ML IJ SOLN
0.2500 mg | INTRAMUSCULAR | Status: DC | PRN
Start: 2013-05-04 — End: 2013-05-04
  Administered 2013-05-04: 0.25 mg via INTRAVENOUS

## 2013-05-04 MED ORDER — ONDANSETRON HCL 4 MG/2ML IJ SOLN
4.0000 mg | Freq: Once | INTRAMUSCULAR | Status: AC | PRN
  Administered 2013-05-04: 4 mg via INTRAVENOUS

## 2013-05-04 MED ORDER — MIDAZOLAM HCL 5 MG/5ML IJ SOLN
INTRAMUSCULAR | Status: DC | PRN
  Administered 2013-05-04: 2 mg via INTRAVENOUS

## 2013-05-04 MED ORDER — PENTAFLUOROPROP-TETRAFLUOROETH EX AERO: 1.0000 "application " | INHALATION_SPRAY | CUTANEOUS | Status: DC | PRN

## 2013-05-04 MED ORDER — PROPOFOL 10 MG/ML IV BOLUS
INTRAVENOUS | Status: DC | PRN
  Administered 2013-05-04 (×3): 50 mg via INTRAVENOUS
  Administered 2013-05-04: 150 mg via INTRAVENOUS

## 2013-05-04 MED ORDER — ALBUTEROL SULFATE HFA 108 (90 BASE) MCG/ACT IN AERS
INHALATION_SPRAY | RESPIRATORY_TRACT | Status: DC | PRN
  Administered 2013-05-04 (×2): 3 via RESPIRATORY_TRACT

## 2013-05-04 MED ORDER — LIDOCAINE-PRILOCAINE 2.5-2.5 % EX CREA
1.0000 "application " | TOPICAL_CREAM | CUTANEOUS | Status: DC | PRN
  Filled 2013-05-04: qty 5

## 2013-05-04 MED ORDER — SUCCINYLCHOLINE CHLORIDE 20 MG/ML IJ SOLN
INTRAMUSCULAR | Status: DC | PRN
  Administered 2013-05-04: 100 mg via INTRAVENOUS

## 2013-05-04 MED ORDER — SODIUM CHLORIDE 0.9 % IR SOLN
Status: DC | PRN
  Administered 2013-05-04: 08:00:00

## 2013-05-04 MED ORDER — PHENYLEPHRINE HCL 10 MG/ML IJ SOLN
INTRAMUSCULAR | Status: DC | PRN
  Administered 2013-05-04: 80 ug via INTRAVENOUS
  Administered 2013-05-04 (×2): 40 ug via INTRAVENOUS
  Administered 2013-05-04: 80 ug via INTRAVENOUS
  Administered 2013-05-04 (×4): 40 ug via INTRAVENOUS
  Administered 2013-05-04 (×3): 80 ug via INTRAVENOUS
  Administered 2013-05-04: 40 ug via INTRAVENOUS

## 2013-05-04 MED ORDER — GLYCOPYRROLATE 0.2 MG/ML IJ SOLN
INTRAMUSCULAR | Status: DC | PRN
  Administered 2013-05-04: 0.2 mg via INTRAVENOUS

## 2013-05-04 MED ORDER — DOXERCALCIFEROL 4 MCG/2ML IV SOLN
INTRAVENOUS | Status: AC
  Administered 2013-05-04: 1 ug via INTRAVENOUS
  Filled 2013-05-04: qty 2

## 2013-05-04 MED ORDER — MEPERIDINE HCL 25 MG/ML IJ SOLN: 6.2500 mg | INTRAMUSCULAR | Status: DC | PRN

## 2013-05-04 MED ORDER — LIDOCAINE HCL (CARDIAC) 20 MG/ML IV SOLN
INTRAVENOUS | Status: DC | PRN
  Administered 2013-05-04: 100 mg via INTRAVENOUS

## 2013-05-04 MED ORDER — 0.9 % SODIUM CHLORIDE (POUR BTL) OPTIME
TOPICAL | Status: DC | PRN
  Administered 2013-05-04: 1000 mL

## 2013-05-04 MED ORDER — HYDROCODONE-ACETAMINOPHEN 5-325 MG PO TABS
1.0000 | ORAL_TABLET | Freq: Once | ORAL | Status: AC
  Administered 2013-05-04: 1 via ORAL

## 2013-05-04 MED ORDER — LIDOCAINE HCL (PF) 1 % IJ SOLN: 5.0000 mL | INTRAMUSCULAR | Status: DC | PRN

## 2013-05-04 MED ORDER — OXYCODONE HCL 5 MG PO TABS: 5.0000 mg | ORAL_TABLET | Freq: Once | ORAL | Status: DC | PRN

## 2013-05-04 MED ORDER — HEPARIN SODIUM (PORCINE) 1000 UNIT/ML IJ SOLN
INTRAMUSCULAR | Status: AC
  Filled 2013-05-04: qty 1

## 2013-05-04 MED ORDER — FENTANYL CITRATE 0.05 MG/ML IJ SOLN
INTRAMUSCULAR | Status: DC | PRN
  Administered 2013-05-04 (×4): 50 ug via INTRAVENOUS

## 2013-05-04 MED ORDER — HEPARIN SODIUM (PORCINE) 1000 UNIT/ML DIALYSIS: 3200.0000 [IU] | Freq: Once | INTRAMUSCULAR | Status: DC

## 2013-05-04 MED ORDER — HYDROCODONE-ACETAMINOPHEN 5-325 MG PO TABS
ORAL_TABLET | ORAL | Status: AC
  Filled 2013-05-04: qty 1

## 2013-05-04 MED ORDER — HEPARIN SODIUM (PORCINE) 1000 UNIT/ML IJ SOLN
INTRAMUSCULAR | Status: DC | PRN
  Administered 2013-05-04: 4.6 mL

## 2013-05-04 MED ORDER — PHENYLEPHRINE HCL 10 MG/ML IJ SOLN
10.0000 mg | INTRAVENOUS | Status: DC | PRN
  Administered 2013-05-04: 15 ug/min via INTRAVENOUS

## 2013-05-04 MED ORDER — ALTEPLASE 2 MG IJ SOLR
2.0000 mg | Freq: Once | INTRAMUSCULAR | Status: DC | PRN
  Filled 2013-05-04: qty 2

## 2013-05-04 MED ORDER — EPHEDRINE SULFATE 50 MG/ML IJ SOLN
INTRAMUSCULAR | Status: DC | PRN
  Administered 2013-05-04 (×2): 10 mg via INTRAVENOUS

## 2013-05-04 MED ORDER — HEPARIN SODIUM (PORCINE) 1000 UNIT/ML DIALYSIS: 1000.0000 [IU] | INTRAMUSCULAR | Status: DC | PRN

## 2013-05-04 SURGICAL SUPPLY — 62 items
BAG DECANTER FOR FLEXI CONT (MISCELLANEOUS) ×3 IMPLANT
BANDAGE ELASTIC 6 VELCRO ST LF (GAUZE/BANDAGES/DRESSINGS) ×3 IMPLANT
BANDAGE GAUZE ELAST BULKY 4 IN (GAUZE/BANDAGES/DRESSINGS) ×3 IMPLANT
CANISTER SUCTION 2500CC (MISCELLANEOUS) ×3 IMPLANT
CATH CANNON HEMO 15F 50CM (CATHETERS) IMPLANT
CATH CANNON HEMO 15FR 19 (HEMODIALYSIS SUPPLIES) IMPLANT
CATH CANNON HEMO 15FR 23CM (HEMODIALYSIS SUPPLIES) ×3 IMPLANT
CATH CANNON HEMO 15FR 31CM (HEMODIALYSIS SUPPLIES) IMPLANT
CATH CANNON HEMO 15FR 32CM (HEMODIALYSIS SUPPLIES) IMPLANT
CATH STRAIGHT 5FR 65CM (CATHETERS) IMPLANT
CHLORAPREP W/TINT 26ML (MISCELLANEOUS) ×3 IMPLANT
CLIP TI MEDIUM 6 (CLIP) ×3 IMPLANT
CLIP TI WIDE RED SMALL 6 (CLIP) ×3 IMPLANT
CLOTH BEACON ORANGE TIMEOUT ST (SAFETY) ×3 IMPLANT
CONT SPEC 4OZ CLIKSEAL STRL BL (MISCELLANEOUS) ×6 IMPLANT
COVER PROBE W GEL 5X96 (DRAPES) ×3 IMPLANT
COVER SURGICAL LIGHT HANDLE (MISCELLANEOUS) ×3 IMPLANT
DECANTER SPIKE VIAL GLASS SM (MISCELLANEOUS) ×3 IMPLANT
DERMABOND ADVANCED (GAUZE/BANDAGES/DRESSINGS)
DERMABOND ADVANCED .7 DNX12 (GAUZE/BANDAGES/DRESSINGS) IMPLANT
DRAPE C-ARM 42X72 X-RAY (DRAPES) ×3 IMPLANT
DRAPE CHEST BREAST 15X10 FENES (DRAPES) ×3 IMPLANT
ELECT REM PT RETURN 9FT ADLT (ELECTROSURGICAL) ×3
ELECTRODE REM PT RTRN 9FT ADLT (ELECTROSURGICAL) ×2 IMPLANT
GAUZE SPONGE 2X2 8PLY STRL LF (GAUZE/BANDAGES/DRESSINGS) ×2 IMPLANT
GAUZE SPONGE 4X4 16PLY XRAY LF (GAUZE/BANDAGES/DRESSINGS) ×3 IMPLANT
GEL ULTRASOUND 20GR AQUASONIC (MISCELLANEOUS) IMPLANT
GLOVE BIO SURGEON STRL SZ7.5 (GLOVE) ×6 IMPLANT
GLOVE SURG SS PI 7.5 STRL IVOR (GLOVE) ×6 IMPLANT
GOWN PREVENTION PLUS XLARGE (GOWN DISPOSABLE) ×3 IMPLANT
GOWN STRL NON-REIN LRG LVL3 (GOWN DISPOSABLE) ×9 IMPLANT
KIT BASIN OR (CUSTOM PROCEDURE TRAY) ×3 IMPLANT
KIT ROOM TURNOVER OR (KITS) ×3 IMPLANT
NEEDLE 18GX1X1/2 (RX/OR ONLY) (NEEDLE) ×3 IMPLANT
NEEDLE HYPO 25GX1X1/2 BEV (NEEDLE) ×3 IMPLANT
NS IRRIG 1000ML POUR BTL (IV SOLUTION) ×3 IMPLANT
PACK CV ACCESS (CUSTOM PROCEDURE TRAY) ×3 IMPLANT
PACK SURGICAL SETUP 50X90 (CUSTOM PROCEDURE TRAY) ×3 IMPLANT
PAD ARMBOARD 7.5X6 YLW CONV (MISCELLANEOUS) ×6 IMPLANT
SET MICROPUNCTURE 5F STIFF (MISCELLANEOUS) IMPLANT
SPONGE GAUZE 2X2 STER 10/PKG (GAUZE/BANDAGES/DRESSINGS) ×1
SPONGE GAUZE 4X4 12PLY (GAUZE/BANDAGES/DRESSINGS) ×3 IMPLANT
SPONGE SURGIFOAM ABS GEL 100 (HEMOSTASIS) IMPLANT
SUT ETHILON 3 0 PS 1 (SUTURE) ×3 IMPLANT
SUT MNCRL AB 4-0 PS2 18 (SUTURE) ×3 IMPLANT
SUT PROLENE 5 0 C 1 24 (SUTURE) ×3 IMPLANT
SUT PROLENE 6 0 CC (SUTURE) ×3 IMPLANT
SUT VIC AB 3-0 SH 27 (SUTURE) ×1
SUT VIC AB 3-0 SH 27X BRD (SUTURE) ×2 IMPLANT
SUT VICRYL 4-0 PS2 18IN ABS (SUTURE) ×6 IMPLANT
SWAB COLLECTION DEVICE MRSA (MISCELLANEOUS) ×3 IMPLANT
SYR 20CC LL (SYRINGE) ×6 IMPLANT
SYR 30ML LL (SYRINGE) IMPLANT
SYR 5ML LL (SYRINGE) ×6 IMPLANT
SYR CONTROL 10ML LL (SYRINGE) ×3 IMPLANT
SYRINGE 10CC LL (SYRINGE) ×3 IMPLANT
TAPE CLOTH SURG 4X10 WHT LF (GAUZE/BANDAGES/DRESSINGS) ×3 IMPLANT
TOWEL OR 17X24 6PK STRL BLUE (TOWEL DISPOSABLE) ×3 IMPLANT
TOWEL OR 17X26 10 PK STRL BLUE (TOWEL DISPOSABLE) ×3 IMPLANT
UNDERPAD 30X30 INCONTINENT (UNDERPADS AND DIAPERS) ×3 IMPLANT
WATER STERILE IRR 1000ML POUR (IV SOLUTION) ×3 IMPLANT
WIRE AMPLATZ SS-J .035X180CM (WIRE) IMPLANT

## 2013-05-04 NOTE — Transfer of Care (Signed)
Immediate Anesthesia Transfer of Care Note  Patient: Trevor [name]  Procedure(s) Performed: Procedure(s): REMOVAL OF ARTERIOVENOUS Fistula Right Arm (Right) INSERTION OF DIALYSIS CATHETER (Right)  Patient Location: PACU  Anesthesia Type:General  Level of Consciousness: awake, alert  and patient cooperative  Airway & Oxygen Therapy: Patient Spontanous Breathing and Patient connected to face mask oxygen  Post-op Assessment: Report given to PACU RN and Post -op Vital signs reviewed and stable  Post vital signs: Reviewed and stable  Complications: No apparent anesthesia complications

## 2013-05-04 NOTE — Progress Notes (Signed)
Hemodialysis- See flowsheet Bleeding from catheter site during treatment. Site was redressed post treatment and surgifoam applied. Clean dry and intact. VP high during last hour of treatment, bfr continuously reduced. Treatment discontinued with 36 minutes remaining d/t poor function and risk of clotting system. Dr. Arlean Hopping notified. Pt received 4 hours of treatment, total UF 1.8L. Pt is at EDW. Order obtained for pt to have soup for dinner per Dr. Isidoro Donning. States his throat is 'very painful and something hot would help".

## 2013-05-04 NOTE — Op Note (Addendum)
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 The patient was placed in supine position on the operating room table. After commencement of general anesthesia, the patient's entire neck and chest were prepped and draped in usual sterile fashion. The patient was placed in Trendelenburg position. Ultrasound was used to identify the patient's right internal jugular vein. This had normal compressibility and respiratory variation.  Using ultrasound guidance, the right internal jugular vein was successfully cannulated.  A 0.035 J-tipped guidewire was threaded into the right internal jugular vein and into the superior vena cava followed by the inferior vena cava under fluoroscopic guidance.   Next sequential 12 and 14 dilators were placed over the guidewire into the right atrium.  A 16 French dilator with a peel-away sheath was then placed over the guidewire into the right atrium.   The guidewire and dilator were removed. A 23 cm Diatek catheter was then placed through the peel away sheath into the right atrium.  The catheter was then tunneled subcutaneously, cut to length, and the hub attached. The catheter was noted to flush and draw easily. The catheter was inspected under fluoroscopy and found with its tip to be in the right atrium without any kinks throughout its course. The catheter was sutured to the skin with nylon sutures. The neck insertion site was closed with Vicryl stitch. The catheter was then loaded with concentrated Heparin solution. A dry sterile dressing was applied. Chest x-ray will be obtained in the recovery room.  Next, the  patient's entire right upper extremity was prepped and draped in the usual sterile fashion. A transverse incision was made near the antecubital crease.  The fistula was dissected free all the way down to the anastomosis and clamped proximally and distally.  This was divided and the proximal cuff of fistula oversewn using a runnning 5 0 prolene.  A longitudinal incision was made over a fistula aneurysm in the mid upper arm. The incision was carried onto the subcutaneous tissues down to level of the pre-existing AV fistula. The fisutla was dissected free circumferentially and the distal end doubly ligated with a 2 0 silk tie. The entire proximal fistula was then dissected free and removed.  It was opened longitudinally and swabbed for culture.  2 segments of tissue were also sent for culture.  An ellipse of redundant skin was removed. Hemostasis was obtained.  Next subcutaneous tissues of each incision were reapproximated using running 3-0 Vicryl suture. The skin was closed with 4 Vicryl subcuticular stitch. The patient tolerated the procedure well and there were no complications. Instrument sponge and needle counts were correct at the end of the case. The patient was taken to the recovery room in stable condition.  Fabienne Bruns, MD [facility] Office: [phone] Pager: [phone]-

## 2013-05-04 NOTE — Anesthesia Preprocedure Evaluation (Addendum)
Anesthesia Evaluation  Patient identified by MRN, date of birth, ID band Patient awake    Reviewed: Allergy & Precautions, H&P , NPO status , Patient's Chart, lab work & pertinent test results, reviewed documented beta blocker date and time   History of Anesthesia Complications Negative for: history of anesthetic complications  Airway Mallampati: II TM Distance: >3 FB Neck ROM: Full    Dental  (+) Dental Advisory Given and Edentulous Upper   Pulmonary shortness of breath and with exertion, Sleep apnea: denies. , former smoker,          Cardiovascular hypertension, Pt. on medications and Pt. on home beta blockers +CHF + dysrhythmias Atrial Fibrillation  2D echo 04/29/13 Study Conclusions  - Left ventricle: The cavity size was mildly to moderately   dilated. Wall thickness was increased in a pattern of mild   LVH. Systolic function was normal. The estimated ejection   fraction was in the range of 50% to 55%. Wall motion was   normal; there were no regional wall motion abnormalities. - Aortic valve: Sclerosis without stenosis. No significant   regurgitation. - Aorta: Aortic root dimension: 33mm (ED). - Left atrium: The atrium was moderately dilated. - Right ventricle: The cavity size was mildly dilated.   Systolic function was mildly reduced. - Right atrium: The atrium was mildly dilated. - Pulmonary arteries: PA peak pressure: 49mm Hg (S). - Pericardium, extracardiac: A trivial pericardial effusion   was identified posterior to the heart. Transthoracic echocardiography.  M-mode, complete 2D, spectral Doppler, and color Doppler.  Height:  Height: 186.7cm. Height: 73.5in.  Weight:  Weight: 104.2kg. Weight: 229.2lb.  Body mass index:  BMI: 29.9kg/m^2.  Body surface area:    BSA: 2.71m^2.  Blood pressure:     121/80.  Patient status:  Inpatient.  Location:  Bedside.    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  negative genitourinary   Musculoskeletal negative musculoskeletal ROS (+)   Abdominal (+) + obese,   Peds  Hematology  (+) Blood dyscrasia, anemia ,   Anesthesia Other Findings   Reproductive/Obstetrics                      Anesthesia Physical Anesthesia Plan  ASA: III  Anesthesia Plan: General   Post-op Pain Management:    Induction: Intravenous  Airway Management Planned: LMA  Additional Equipment:   Intra-op Plan:   Post-operative Plan: Extubation in OR  Informed Consent: I have reviewed the patients History and Physical, chart, labs and discussed the procedure including the risks, benefits and alternatives for the proposed anesthesia with the patient or authorized representative who has indicated his/her understanding and acceptance.     Plan Discussed with: CRNA and Surgeon  Anesthesia Plan Comments:         Anesthesia Quick Evaluation

## 2013-05-04 NOTE — Interval H&P Note (Signed)
History and Physical Interval Note:  05/04/2013 6:51 AM  Trevor [name]  has presented today for surgery, with the diagnosis of Infected Right Arm Fistula  The various methods of treatment have been discussed with the patient and family. After consideration of risks, benefits and other options for treatment, the patient has consented to  Procedure(s): REMOVAL OF ARTERIOVENOUS Fistula Right Arm (Right) INSERTION OF DIALYSIS CATHETER (N/A) as a surgical intervention .  The patient's history has been reviewed, patient examined, no change in status, stable for surgery.  I have reviewed the patient's chart and labs.  Questions were answered to the patient's satisfaction.     FIELDS,CHARLES E

## 2013-05-04 NOTE — Progress Notes (Signed)
Patient ID: Trevor [name]  male  ZOX:[id number]    DOB: 09/07/1951    DOA: 05/02/2013  PCP: No PCP Per Patient  Assessment/Plan:  Fever: likely due to infection of AVF  -CXR and UA neg for infection  - blood, sputum culture negative so far -For today status post excision of right Brachial Cephalic aVF and placement of right IJ by vascular surgery, cultures sent  ESRD:  -MWF dialysis, nephrology following for hemodialysis   A Fib/Flutter with RVR:  Rate improved -new onset, may have been precipitated by febrile illness  -Cardiac enzymes negative for acute ACS - had recent echo on 8/22 with EF 50% to 55%, no wall motion abnl and moderately dilated LA - Will hold off another echo at this time  - continue to monitor on tele  - his CHADS2 score is 1 and options include full dose ASA vs coumadin. Will continue ASA-change to full dose.   Anemia:  -Hemoglobin down to 8.9, followup in a.m., may need transfusion if less than 8   HTN:  - continue home meds   Abnormal chest x-ray - Chest x-ray done today shows prominence of right paratracheal soft tissue, possibly vascular congestion,  underlying LAD cannot be ruled out. Recommended contrast-enhanced chest CT to exclude lymphadenopathy or mediastinal mass if clinically indicated. No prior CT chest. Previous chest x-ray upto 2010 had shown similar prominent right paratracheal soft tissue.  DVT Prophylaxis:  Code Status:  Disposition:    Subjective: Seen during hemodialysis, sleepy, tired, hungry  Objective: Weight change: 1.459 kg (3 lb 3.5 oz)  Intake/Output Summary (Last 24 hours) at 05/04/13 1814 Last data filed at 05/04/13 1035  Gross per 24 hour  Intake    400 ml  Output    150 ml  Net    250 ml   Blood pressure 155/100, pulse 76, temperature 98 F (36.7 C), temperature source Oral, resp. rate 20, height 6\' 1"  (1.854 m), weight 105.4 kg (232 lb 5.8 oz), SpO2 98.00%.  Physical Exam: General: Alert and awake, oriented x3,  not in any acute distress. CVS: S1-S2 clear, no murmur rubs or gallops Chest: clear to auscultation bilaterally, no wheezing, rales or rhonchi Abdomen: soft nontender, nondistended, normal bowel sounds  Extremities: no cyanosis, clubbing or edema noted bilaterally Right IJ placed today 8/27, per RN  dressing reinforced  Lab Results: Basic Metabolic Panel:  Recent Labs Lab 04/30/13 0350  05/04/13 0446 05/04/13 1341  NA 137  < > 135 134*  K 3.9  < > 3.6 4.2  CL 97  < > 95* 94*  CO2 28  < > 27 29  GLUCOSE 75  < > 97 94  BUN 22  < > 43* 43*  CREATININE 6.46*  < > 9.33* 10.24*  CALCIUM 9.6  < > 9.7 9.3  MG 2.2  --   --   --   PHOS  --   --   --  4.6  < > = values in this interval not displayed. Liver Function Tests:  Recent Labs Lab 04/30/13 0350 05/02/13 2123 05/04/13 1341  AST 24 26  --   ALT 18 17  --   ALKPHOS 146* 152*  --   BILITOT 0.5 0.8  --   PROT 7.7 8.2  --   ALBUMIN 3.3* 3.5 3.2*   No results found for this basename: LIPASE, AMYLASE,  in the last 168 hours No results found for this basename: AMMONIA,  in the last 168 hours  CBC:  Recent Labs Lab 05/02/13 2123 05/04/13 0446 05/04/13 1341  WBC 4.1 2.4* 2.7*  NEUTROABS 3.2  --   --   HGB 10.4* 9.1* 8.9*  HCT 30.3* 26.9* 26.5*  MCV 86.8 88.2 88.0  PLT 193 174 177   Cardiac Enzymes:  Recent Labs Lab 05/03/13 2254 05/04/13 0446  TROPONINI <0.30 <0.30   BNP: No components found with this basename: POCBNP,  CBG: No results found for this basename: GLUCAP,  in the last 168 hours   Micro Results: Recent Results (from the past 240 hour(s))  CULTURE, BLOOD (ROUTINE X 2)     Status: None   Collection Time    05/02/13  9:05 PM      Result Value Range Status   Specimen Description BLOOD LEFT ARM   Final   Special Requests BOTTLES DRAWN AEROBIC AND ANAEROBIC 10CC EACH   Final   Culture  Setup Time     Final   Value: 05/03/2013 01:59     Performed at [facility]   Culture     Final    Value:        BLOOD CULTURE RECEIVED NO GROWTH TO DATE CULTURE WILL BE HELD FOR 5 DAYS BEFORE ISSUING A FINAL NEGATIVE REPORT     Performed at [facility]   Report Status PENDING   Incomplete  CULTURE, BLOOD (ROUTINE X 2)     Status: None   Collection Time    05/02/13  9:15 PM      Result Value Range Status   Specimen Description BLOOD LEFT ARM   Final   Special Requests BOTTLES DRAWN AEROBIC ONLY 10CC   Final   Culture  Setup Time     Final   Value: 05/03/2013 01:58     Performed at [facility]   Culture     Final   Value:        BLOOD CULTURE RECEIVED NO GROWTH TO DATE CULTURE WILL BE HELD FOR 5 DAYS BEFORE ISSUING A FINAL NEGATIVE REPORT     Performed at [facility]   Report Status PENDING   Incomplete  CULTURE, EXPECTORATED SPUTUM-ASSESSMENT     Status: None   Collection Time    05/03/13 10:07 AM      Result Value Range Status   Specimen Description SPUTUM   Final   Special Requests Normal   Final   Sputum evaluation     Final   Value: THIS SPECIMEN IS ACCEPTABLE. RESPIRATORY CULTURE REPORT TO FOLLOW.   Report Status 05/03/2013 FINAL   Final  CULTURE, RESPIRATORY (NON-EXPECTORATED)     Status: None   Collection Time    05/03/13 10:07 AM      Result Value Range Status   Specimen Description SPUTUM   Final   Special Requests NONE   Final   Gram Stain     Final   Value: FEW WBC PRESENT,BOTH PMN AND MONONUCLEAR     RARE SQUAMOUS EPITHELIAL CELLS PRESENT     RARE YEAST     RARE GRAM POSITIVE RODS     RARE GRAM POSITIVE COCCI   Culture     Final   Value: Culture reincubated for better growth     Performed at [facility]   Report Status PENDING   Incomplete    Studies/Results: Dg Chest 2 View  04/27/2013   *RADIOLOGY REPORT*  Clinical Data: Cough, history of MI, hypertension  CHEST - 2 VIEW  Comparison: 04/21/2013  Findings: Cardiomegaly again noted.  Again noted significant central vascular congestion.  No acute infiltrate or pleural  effusion.  No pulmonary edema.  Stable right paratracheal soft tissue prominence.  IMPRESSION: No active disease.  Cardiomegaly. Central vascular congestion again noted.  No significant change   Original Report Authenticated By: Natasha Mead, M.D.   Dg Chest 2 View  04/21/2013   *RADIOLOGY REPORT*  Clinical Data: Cough for 2 weeks, dialysis patient, former smoking history  CHEST - 2 VIEW  Comparison: Chest x-ray of 05/01/2011,  04/08/2010 and 12/04/2008.  Findings: Moderate cardiomegaly is stable as is the prominent right paratracheal soft tissue.  The hila also are prominent consistent with pulmonary arterial hypertension.  No focal infiltrate or effusion is seen.  No acute bony abnormality is noted with diffuse degenerative change throughout the thoracic spine.  IMPRESSION: Stable cardiomegaly and prominent pulmonary artery segments.  No definite active process.   Original Report Authenticated By: Dwyane Dee, M.D.   Ct Head Wo Contrast  04/27/2013   *RADIOLOGY REPORT*  Clinical Data: Syncope, atrial fibrillation  CT HEAD WITHOUT CONTRAST  Technique:  Contiguous axial images were obtained from the base of the skull through the vertex without contrast.  Comparison: None.  Findings: There is nodular mucosal thickening posterior aspect of the left maxillary sinus measures about 2.9 cm probable mucous retention cyst.  The mastoid air cells are unremarkable.  No intracranial hemorrhage, mass effect or midline shift.  No hydrocephalus.  No acute infarction.  The gray and white matter differentiation is preserved.  No mass lesion is noted on this unenhanced scan.  IMPRESSION: No acute intracranial abnormality.  Probable mucous retention cyst left maxillary sinus measures 2.9 cm.   Original Report Authenticated By: Natasha Mead, M.D.   Dg Chest Port 1 View  05/04/2013   *RADIOLOGY REPORT*  Clinical Data: Status post right Diatek catheter insertion.  PORTABLE CHEST - 1 VIEW  Comparison: Chest x-ray 05/02/2013.  Findings:  Lung volumes are low.  Patchy interstitial and airspace disease throughout the lung bases bilaterally, and in the perihilar regions.  Mild cephalization of the pulmonary vasculature. Probable small right pleural effusion.  No left pleural effusion. No pneumothorax.  Mild cardiomegaly. The patient is rotated to the right on today's exam, resulting in distortion of the mediastinal contours and reduced diagnostic sensitivity and specificity for mediastinal pathology. With these limitations in mind, there is marked prominence of the right paratracheal soft tissues. Atherosclerosis in the thoracic aorta.  Right-sided internal jugular PermCath with tips terminating in the superior right atrium and at the superior cavoatrial junction.  IMPRESSION: 1.  Right sided PermCath appears properly located, without evidence of pneumothorax or other acute complicating features. 2.  Findings suggestive of a congestive heart failure, as above. 3.  Atherosclerosis. 4.  Prominence of the right paratracheal soft tissues similar to numerous prior examinations.  This may simply reflect vascular congestion, although underlying lymphadenopathy is difficult to exclude.  Further evaluation with contrast enhanced chest CT could be performed to exclude lymphadenopathy or mediastinal mass if clinically indicated.   Original Report Authenticated By: Trudie Reed, M.D.   Dg Chest Port 1 View  05/02/2013   *RADIOLOGY REPORT*  Clinical Data: Fever  PORTABLE CHEST - 1 VIEW  Comparison: April 27, 2013.  Findings: Stable cardiomegaly and central pulmonary vascular congestion.  No acute pulmonary disease is noted.  No pleural effusion or pneumothorax is noted.  Bony thorax is intact.  IMPRESSION: Stable cardiomegaly and central pulmonary vascular congestion.  Original Report Authenticated By: Lupita Raider.,  M.D.   Dg Fluoro Guide Cv Line-no Report  05/04/2013   CLINICAL DATA: dialysis catheter   FLOURO GUIDE CV LINE  Fluoroscopy was utilized  by the requesting physician.  No radiographic  interpretation.     Medications: Scheduled Meds: . amLODipine  10 mg Oral Daily  . aspirin EC  325 mg Oral Daily  . darbepoetin  25 mcg Intravenous Q7 days  . doxercalciferol  1 mcg Intravenous Q M,W,F-HD  . heparin  3,200 Units Dialysis Once in dialysis  . heparin  5,000 Units Subcutaneous Q8H  . HYDROcodone-acetaminophen      . labetalol  200 mg Oral BID  . ondansetron      . sevelamer carbonate  4,000 mg Oral TID WC  . sodium chloride  3 mL Intravenous Q12H  . sodium chloride  3 mL Intravenous Q12H  . vancomycin  1,000 mg Intravenous Q M,W,F-HD      LOS: 2 days   Cleotha Tsang M.D. [facility] 05/04/2013, 6:14 PM Pager: [phone]  If 7PM-7AM, please contact night-coverage [email or web address] Password [email or web address]

## 2013-05-04 NOTE — Progress Notes (Signed)
Subjective:   Feels fine, but sleepy. Back from OR s/p excision on RUA AVF and placement of R IJ cath. No complaints.   Objective Filed Vitals:   05/04/13 1200 05/04/13 1215 05/04/13 1230 05/04/13 1330  BP: 138/108 150/107 149/114 141/102  Pulse: 82 88 100 101  Temp:   98.1 F (36.7 C) 98 F (36.7 C)  TempSrc:    Oral  Resp: 10 28 19    Height:      Weight:    105.4 kg (232 lb 5.8 oz)  SpO2: 94% 92% 92% 91%   Dialysis Orders: MWF @ [facility].  4 hr 30 mins 104.5 kg 2K/2.25ca+ Heparin 3200u. RUA AVF 400 BFR 800 DFR  Profile 2  1 mcg hectorol IV/HD Epogen 3000 Units IV/HD NoVenofer   Physical Exam General: drowsy s/p OR, easily arousable. Appears comfortable. No acute distress Heart: RRR no rub or M Lungs:clear, unlabored. Abdomen: soft nontenter, +BS  Extremities: no edema. R arm fistula site wrapped, no drainage noted Dialysis Access: R IJ cath (placed 8/27) blood oozing at site  Assessment/Plan: 1. Fever / possible infected AVF: WBC 2.4 Afebrile. Vanc 1 gram QHD per pharmacy. Dialysis access duplex- no perigraft fluid or sludge, AVF patent. Blood cx no growth to date and sputum cultures pending. Chest xray- No acute pulmonary disease. OR (8/27) for excision of AVF and Segments of fistula aneurysm sent for culure and tunneled IJ cath placement on R side  2. [place] K+ 3.6. HD now  3. Hypertension/volume - BP 144/107, norvasc 10mg . no volume excess- 4. Anemia - hgb 9.1 on 3000u Epo out pt, cont ESA  5. Metabolic bone disease - Ca+ 9.7, phos 3.5, pth 213(7/23) renvela with meals. 1 mcg hectorol  6. Nutrition - Alb 3.5. Renal diet. Encourage protein  7. Afib- ASA and labetalol. on tele  Jetty Duhamel, NP [facility] [phone] 05/04/2013,1:45 PM  LOS: 2 days   Patient seen and examined.  I agree with assessment and plan as above with additions as indicated. [name]  MD Pager [phone]    Cell  [phone] 05/04/2013, 5:17 PM     Basic  Metabolic Panel:  Recent Labs Lab 04/29/13 0732 04/30/13 0350 05/02/13 2123 05/04/13 0446  NA 136 137 137 135  K 4.0 3.9 3.9 3.6  CL 97 97 95* 95*  CO2 27 28 31 27   GLUCOSE 84 75 71 97  BUN 37* 22 23 43*  CREATININE 9.81* 6.46* 6.31* 9.33*  CALCIUM 9.8 9.6 9.8 9.7  PHOS 3.9  --   --   --    Liver Function Tests:  Recent Labs Lab 04/29/13 0500 04/29/13 0732 04/30/13 0350 05/02/13 2123  AST 16  --  24 26  ALT 16  --  18 17  ALKPHOS 129*  --  146* 152*  BILITOT 0.5  --  0.5 0.8  PROT 7.4  --  7.7 8.2  ALBUMIN 3.2* 3.3* 3.3* 3.5   No results found for this basename: LIPASE, AMYLASE,  in the last 168 hours CBC:  Recent Labs Lab 04/28/13 0545 04/29/13 0732 05/02/13 2123 05/04/13 0446  WBC 2.8* 2.6* 4.1 2.4*  NEUTROABS  --   --  3.2  --   HGB 9.1* 9.3* 10.4* 9.1*  HCT 27.3* 27.5* 30.3* 26.9*  MCV 88.6 88.1 86.8 88.2  PLT 171 165 193 174   Medications:   . amLODipine  10 mg Oral Daily  . aspirin EC  325  mg Oral Daily  . darbepoetin  25 mcg Intravenous Q7 days  . doxercalciferol  1 mcg Intravenous Q M,W,F-HD  . heparin  5,000 Units Subcutaneous Q8H  . labetalol  200 mg Oral BID  . ondansetron      . sevelamer carbonate  4,000 mg Oral TID WC  . sodium chloride  3 mL Intravenous Q12H  . sodium chloride  3 mL Intravenous Q12H  . vancomycin  1,000 mg Intravenous Q M,W,F-HD

## 2013-05-04 NOTE — Preoperative (Signed)
Beta Blockers   Reason not to administer Beta Blockers:labetalol 05/03/13 2235

## 2013-05-04 NOTE — Procedures (Deleted)
I was present at this dialysis session. I have reviewed the session itself and made appropriate changes.   [name]  MD Pager [phone]    Cell  [phone] 05/04/2013, 4:24 PM

## 2013-05-04 NOTE — Procedures (Signed)
I was present at this dialysis session. I have reviewed the session itself and made appropriate changes.   [name]  MD Pager [phone]    Cell  [phone] 05/04/2013, 5:15 PM

## 2013-05-04 NOTE — Anesthesia Postprocedure Evaluation (Signed)
Anesthesia Post Note  Patient: Trevor [name]  Procedure(s) Performed: Procedure(s) (LRB): REMOVAL OF ARTERIOVENOUS Fistula Right Arm (Right) INSERTION OF DIALYSIS CATHETER (Right)  Anesthesia type: general  Patient location: PACU  Post pain: Pain level controlled  Post assessment: Patient's Cardiovascular Status Stable  Last Vitals:  Filed Vitals:   05/04/13 1145  BP: 148/104  Pulse: 85  Temp:   Resp: 26    Post vital signs: Reviewed and stable  Level of consciousness: sedated  Complications: No apparent anesthesia complications

## 2013-05-04 NOTE — Anesthesia Procedure Notes (Addendum)
Procedure Name: LMA Insertion Date/Time: 05/04/2013 8:38 AM Performed by: Leona Singleton A Patient Re-evaluated:Patient Re-evaluated prior to inductionOxygen Delivery Method: Circle system utilized Preoxygenation: Pre-oxygenation with 100% oxygen Intubation Type: IV induction LMA: LMA inserted LMA Size: 5.0 Tube type: Oral Number of attempts: 1 Placement Confirmation: positive ETCO2 and breath sounds checked- equal and bilateral Tube secured with: Tape Dental Injury: Teeth and Oropharynx as per pre-operative assessment    Procedure Name: Intubation Date/Time: 05/04/2013 9:45 AM Performed by: Leona Singleton A Pre-anesthesia Checklist: Patient identified, Emergency Drugs available, Suction available and Patient being monitored Patient Re-evaluated:Patient Re-evaluated prior to inductionOxygen Delivery Method: Circle system utilized Preoxygenation: Pre-oxygenation with 100% oxygen Intubation Type: IV induction Laryngoscope Size: Mac and 4 Grade View: Grade II Tube type: Oral Tube size: 7.5 mm Number of attempts: 1 Airway Equipment and Method: Stylet Placement Confirmation: positive ETCO2 and breath sounds checked- equal and bilateral Secured at: 22 cm Tube secured with: Tape Dental Injury: Teeth and Oropharynx as per pre-operative assessment

## 2013-05-04 NOTE — H&P (View-Only) (Signed)
[facility]  Consultation  Reason for consult: infected AV fistula with bleeding Requesting: [name], MD History of Present Illness:  Patient is a 62 y.o. year old male who presents for evaluation of possible right arm AV fistula infection and bleeding from fistula.  Fistula placed 3 years ago.  No significant problems.  Has had several bleeding episodes recently and one where hemoglobin went from 12-9.  Has had chronic cough over the last few weeks and also had some hemoptysis.  Chest xray negative for pneumonia.  During this hospital admission had fever of 103 and has also had some fevers after dialysis sessions.  Other medical problems include atrial fibrillation, hypertension which are currently controlled.  No current diagnosis for his chronic cough.  Past Medical History  Diagnosis Date  . Anemia   . AVF (arteriovenous fistula)     Left  . Secondary hyperparathyroidism   . Hypovitaminosis D   . Hypertensive urgency     H/o  . CHF (congestive heart failure)   . Exertional shortness of breath     "related to infection in my lungs right now" (05/03/2013)  . History of gout     "before I started doing the dialysis" (05/03/2013)  . ESRD (end stage renal disease) on dialysis     "[facility]; MWF" (05/03/2013)    Past Surgical History  Procedure Laterality Date  . Av fistula placement Left     Dr. Charlean Sanfilippo; "I've had 2 on the left' (05/03/2013)  . Av fistula placement Right ~ 2011  . Knee arthroscopy Left      Social History History  Substance Use Topics  . Smoking status: Former Smoker -- 0.25 packs/day for .5 years    Types: Cigarettes  . Smokeless tobacco: Never Used     Comment: 05/03/2013 "stopped smoking ~ 40 yr ago"  . Alcohol Use: Yes     Comment: 05/03/2013 "haven't had a glass of wine in ~ 3 months or so; sometimes will have one w/dinner"    Family History Family History  Problem Relation Age of Onset  . Hypertension Father   .  Kidney disease Father   . Allergies Father     Allergies  No Known Allergies   Current Facility-Administered Medications  Medication Dose Route Frequency Provider Last Rate Last Dose  . 0.9 %  sodium chloride infusion  250 mL Intravenous PRN Mariea Stable, MD      . acetaminophen (TYLENOL) tablet 650 mg  650 mg Oral Q6H PRN Loren Racer, MD   650 mg at 05/02/13 2135  . amLODipine (NORVASC) tablet 10 mg  10 mg Oral Daily Mariea Stable, MD   10 mg at 05/03/13 1001  . aspirin EC tablet 81 mg  81 mg Oral Daily Mariea Stable, MD   81 mg at 05/03/13 1001  . [START ON 05/04/2013] darbepoetin (ARANESP) injection 25 mcg  25 mcg Intravenous Q7 days Derrill Kay, NP      . Melene Muller ON 05/04/2013] doxercalciferol ([name]) injection 1 mcg  1 mcg Intravenous Q M,W,F-HD Derrill Kay, NP      . heparin injection 5,000 Units  5,000 Units Subcutaneous Q8H Mariea Stable, MD      . labetalol (NORMODYNE) tablet 200 mg  200 mg Oral BID Mariea Stable, MD   200 mg at 05/03/13 1001  . sevelamer carbonate (RENVELA) tablet 4,000 mg  4,000 mg Oral TID WC Mariea Stable, MD   4,000 mg at 05/03/13 1159  .  sodium chloride 0.9 % injection 3 mL  3 mL Intravenous Q12H Mariea Stable, MD      . sodium chloride 0.9 % injection 3 mL  3 mL Intravenous Q12H Mariea Stable, MD      . sodium chloride 0.9 % injection 3 mL  3 mL Intravenous PRN Mariea Stable, MD        ROS:   General:  No weight loss, Fever, chills  HEENT: No recent headaches, no nasal bleeding, no visual changes, no sore throat  Neurologic: No dizziness, blackouts, seizures. No recent symptoms of stroke or mini- stroke. No recent episodes of slurred speech, or temporary blindness.  Cardiac: No recent episodes of chest pain/pressure, no shortness of breath at rest.  +shortness of breath with exertion.  + history of atrial fibrillation or irregular heartbeat  Vascular: No history of rest pain in feet.  No history of  claudication.  No history of non-healing ulcer, No history of DVT   Pulmonary: No home oxygen, + productive cough, + hemoptysis,  No asthma or wheezing  Musculoskeletal:  [ ]  Arthritis, [ ]  Low back pain,  [ ]  Joint pain  Hematologic:No history of hypercoagulable state.  No history of easy bleeding.  + history of anemia  Gastrointestinal: No hematochezia or melena,  No gastroesophageal reflux, no trouble swallowing  Urinary: [ ]  chronic Kidney disease, [x ] on HD - [ ]  MWF or [ ]  TTHS, [ ]  Burning with urination, [ ]  Frequent urination, [ ]  Difficulty urinating;   Skin: No rashes  Psychological: No history of anxiety,  No history of depression   Physical Examination  Filed Vitals:   05/02/13 2245 05/02/13 2315 05/02/13 2350 05/03/13 0431  BP: 145/90  139/88 144/87  Pulse: 102  84 64  Temp:  100.1 F (37.8 C) 100 F (37.8 C) 98.2 F (36.8 C)  TempSrc:  Oral Oral Oral  Resp: 33  20 18  Height:   6\' 1"  (1.854 m)   Weight:   231 lb 7.7 oz (105 kg)   SpO2: 97%  94% 96%    Body mass index is 30.55 kg/(m^2).  General:  Alert and oriented, no acute distress HEENT: Normal Neck: No bruit or JVD Pulmonary: Clear to auscultation bilaterally Cardiac: Regular Rate and Rhythm Abdomen: Soft, non-tender, non-distended Skin: No rash, button hole cannulation site proximal right upper arm fistula 2-3 cm diameter with some erosion on surface and surrounding yellowish blister, upper cannulation site also with some minor ulceration, fistula tortuous with easily palpable thrill Extremity Pulses:  2+ radial, brachial,pulses bilaterally, occluded left upper arm and forearm fistulae non tender no erythema Musculoskeletal: No deformity or edema  Neurologic: Upper and lower extremity motor 5/5 and symmetric  DATA: Chest xray no pneumonia, ECHO normal EF with no obvious vegetations on transthoracic   ASSESSMENT: Fever with bleeding episodes right AVF no other source for fever currently.  Erosions  are high risk for significant bleed.  Would not reconstruct fistula in light of possible infection   PLAN:  Ligate right arm AVF with excision of proximal half and placement of Diatek tomorrow.  Will also check HIV and hep C.  Will defer placement of PPD and further pulmonary work up to primary team.  NPO  Post midnight.  Ancef on call.  Fabienne Bruns, MD [facility] [place] Office: [phone] Pager: [phone]

## 2013-05-05 ENCOUNTER — Institutional Professional Consult (permissible substitution): Payer: Medicare Other | Admitting: Internal Medicine

## 2013-05-05 ENCOUNTER — Telehealth: Payer: Self-pay | Admitting: Vascular Surgery

## 2013-05-05 LAB — CULTURE, RESPIRATORY W GRAM STAIN: Culture: NORMAL

## 2013-05-05 MED ORDER — HYDROMORPHONE HCL PF 1 MG/ML IJ SOLN
INTRAMUSCULAR | Status: AC
  Administered 2013-05-05: 0.5 mg via INTRAVENOUS
  Filled 2013-05-05: qty 1

## 2013-05-05 MED ORDER — OXYCODONE HCL 5 MG PO TABS
5.0000 mg | ORAL_TABLET | Freq: Four times a day (QID) | ORAL | Status: DC | PRN
  Administered 2013-05-05 – 2013-05-07 (×7): 10 mg via ORAL
  Filled 2013-05-05 (×6): qty 2

## 2013-05-05 MED ORDER — HYDROMORPHONE HCL PF 1 MG/ML IJ SOLN
0.5000 mg | INTRAMUSCULAR | Status: DC | PRN
  Administered 2013-05-05: 0.5 mg via INTRAVENOUS

## 2013-05-05 NOTE — Progress Notes (Signed)
Patient ID: Trevor [name]  male  ZOX:[id number]    DOB: [birth year]    DOA: 05/02/2013  PCP: No PCP Per Patient  Assessment/Plan:  Fever: likely due to infection of AVF, afebrile in last 24 hours  -CXR and UA neg for infection  - blood, sputum culture negative so far - s/p excision of right Brachial Cephalic aVF and placement of right IJ by vascular surgery, cultures pending - Complaining of discomfort in the right arm and neck, pain controlled with oxycodone and dilaudid  ESRD:  -MWF dialysis, nephrology following for HD, d/w with renal for early HD tomorrow for disposition  A Fib/Flutter with RVR:  Rate improved -new onset, may have been precipitated by febrile illness  -Cardiac enzymes negative for acute ACS - had recent echo on 8/22 with EF 50% to 55%, no wall motion abnl and moderately dilated LA, hold off another echo at this time  - continue to monitor on tele  - his CHADS2 score is 1 and options include full dose ASA vs coumadin. Will continue ASA-change to full dose.   Anemia:  -Hemoglobin down to 8.9, followup in a.m., may need transfusion if less than 8   HTN:  - continue home meds   Abnormal chest x-ray - Chest x-ray done today shows prominence of right paratracheal soft tissue, possibly vascular congestion,  underlying LAD cannot be ruled out. Recommended contrast-enhanced chest CT to exclude lymphadenopathy or mediastinal mass if clinically indicated. No prior CT chest. Previous chest x-ray upto 2010 had shown similar prominent right paratracheal soft tissue.  DVT Prophylaxis:  Code Status:  Disposition: Hopefully tomorrow after dialysis if okay with renal    Subjective:   Objective: Weight change: -1.059 kg (-2 lb 5.4 oz)  Intake/Output Summary (Last 24 hours) at 05/05/13 1432 Last data filed at 05/05/13 1100  Gross per 24 hour  Intake   1080 ml  Output   1587 ml  Net   -507 ml   Blood pressure 128/86, pulse 97, temperature 99.3 F (37.4 C), temperature  source Oral, resp. rate 18, height 6\' 1"  (1.854 m), weight 104.6 kg (230 lb 9.6 oz), SpO2 96.00%.  Physical Exam: General: Alert and awake, oriented x3, Nad CVS: S1-S2 clear Chest: ctab Abdomen: soft nontender, nondistended, normal bowel sounds  Extremities: no cyanosis, clubbing or edema noted bilaterally Right IJ placed,  Lab Results: Basic Metabolic Panel:  Recent Labs Lab 04/30/13 0350  05/04/13 0446 05/04/13 1341  NA 137  < > 135 134*  K 3.9  < > 3.6 4.2  CL 97  < > 95* 94*  CO2 28  < > 27 29  GLUCOSE 75  < > 97 94  BUN 22  < > 43* 43*  CREATININE 6.46*  < > 9.33* 10.24*  CALCIUM 9.6  < > 9.7 9.3  MG 2.2  --   --   --   PHOS  --   --   --  4.6  < > = values in this interval not displayed. Liver Function Tests:  Recent Labs Lab 04/30/13 0350 05/02/13 2123 05/04/13 1341  AST 24 26  --   ALT 18 17  --   ALKPHOS 146* 152*  --   BILITOT 0.5 0.8  --   PROT 7.7 8.2  --   ALBUMIN 3.3* 3.5 3.2*   No results found for this basename: LIPASE, AMYLASE,  in the last 168 hours No results found for this basename: AMMONIA,  in the last 168  hours CBC:  Recent Labs Lab 05/02/13 2123 05/04/13 0446 05/04/13 1341  WBC 4.1 2.4* 2.7*  NEUTROABS 3.2  --   --   HGB 10.4* 9.1* 8.9*  HCT 30.3* 26.9* 26.5*  MCV 86.8 88.2 88.0  PLT 193 174 177   Cardiac Enzymes:  Recent Labs Lab 05/03/13 2254 05/04/13 0446  TROPONINI <0.30 <0.30   BNP: No components found with this basename: POCBNP,  CBG: No results found for this basename: GLUCAP,  in the last 168 hours   Micro Results: Recent Results (from the past 240 hour(s))  CULTURE, BLOOD (ROUTINE X 2)     Status: None   Collection Time    05/02/13  9:05 PM      Result Value Range Status   Specimen Description BLOOD LEFT ARM   Final   Special Requests BOTTLES DRAWN AEROBIC AND ANAEROBIC 10CC EACH   Final   Culture  Setup Time     Final   Value: 05/03/2013 01:59     Performed at [facility]   Culture     Final    Value:        BLOOD CULTURE RECEIVED NO GROWTH TO DATE CULTURE WILL BE HELD FOR 5 DAYS BEFORE ISSUING A FINAL NEGATIVE REPORT     Performed at [facility]   Report Status PENDING   Incomplete  CULTURE, BLOOD (ROUTINE X 2)     Status: None   Collection Time    05/02/13  9:15 PM      Result Value Range Status   Specimen Description BLOOD LEFT ARM   Final   Special Requests BOTTLES DRAWN AEROBIC ONLY 10CC   Final   Culture  Setup Time     Final   Value: 05/03/2013 01:58     Performed at [facility]   Culture     Final   Value:        BLOOD CULTURE RECEIVED NO GROWTH TO DATE CULTURE WILL BE HELD FOR 5 DAYS BEFORE ISSUING A FINAL NEGATIVE REPORT     Performed at [facility]   Report Status PENDING   Incomplete  CULTURE, EXPECTORATED SPUTUM-ASSESSMENT     Status: None   Collection Time    05/03/13 10:07 AM      Result Value Range Status   Specimen Description SPUTUM   Final   Special Requests Normal   Final   Sputum evaluation     Final   Value: THIS SPECIMEN IS ACCEPTABLE. RESPIRATORY CULTURE REPORT TO FOLLOW.   Report Status 05/03/2013 FINAL   Final  CULTURE, RESPIRATORY (NON-EXPECTORATED)     Status: None   Collection Time    05/03/13 10:07 AM      Result Value Range Status   Specimen Description SPUTUM   Final   Special Requests NONE   Final   Gram Stain     Final   Value: FEW WBC PRESENT,BOTH PMN AND MONONUCLEAR     RARE SQUAMOUS EPITHELIAL CELLS PRESENT     RARE YEAST     RARE GRAM POSITIVE RODS     RARE GRAM POSITIVE COCCI   Culture     Final   Value: NORMAL OROPHARYNGEAL FLORA     Performed at [facility]   Report Status 05/05/2013 FINAL   Final  WOUND CULTURE     Status: None   Collection Time    05/04/13 10:26 AM      Result Value Range Status  Specimen Description WOUND ARM RIGHT   Final   Special Requests RIGHT AV FISTULA PT ON VANCOMYCIN AND ANCEF   Final   Gram Stain     Final   Value: NO WBC SEEN     NO SQUAMOUS  EPITHELIAL CELLS SEEN     NO ORGANISMS SEEN     Performed at [facility]   Culture     Final   Value: NO GROWTH 1 DAY     Performed at [facility]   Report Status PENDING   Incomplete  TISSUE CULTURE     Status: None   Collection Time    05/04/13 10:33 AM      Result Value Range Status   Specimen Description TISSUE ARM RIGHT   Final   Special Requests     Final   Value: INFECTED RIGHT AV FISTULA NO 2 PT ON VANCOMYCIN AND ANCEF   Gram Stain     Final   Value: NO WBC SEEN     NO ORGANISMS SEEN     Performed at [facility]   Culture     Final   Value: NO GROWTH 1 DAY     Performed at [facility]   Report Status PENDING   Incomplete  TISSUE CULTURE     Status: None   Collection Time    05/04/13 10:34 AM      Result Value Range Status   Specimen Description TISSUE ARM RIGHT   Final   Special Requests     Final   Value: RIGHT INFECTED AV FISTULA NO 3 PT ON VANCOMYCIN AND ANCEF   Gram Stain     Final   Value: NO WBC SEEN     NO ORGANISMS SEEN     Performed at [facility]   Culture     Final   Value: NO GROWTH 1 DAY     Performed at [facility]   Report Status PENDING   Incomplete    Studies/Results: Dg Chest 2 View  04/27/2013   *RADIOLOGY REPORT*  Clinical Data: Cough, history of MI, hypertension  CHEST - 2 VIEW  Comparison: 04/21/2013  Findings: Cardiomegaly again noted.  Again noted significant central vascular congestion.  No acute infiltrate or pleural effusion.  No pulmonary edema.  Stable right paratracheal soft tissue prominence.  IMPRESSION: No active disease.  Cardiomegaly. Central vascular congestion again noted.  No significant change   Original Report Authenticated By: Natasha Mead, M.D.   Dg Chest 2 View  04/21/2013   *RADIOLOGY REPORT*  Clinical Data: Cough for 2 weeks, dialysis patient, former smoking history  CHEST - 2 VIEW  Comparison: Chest x-ray of 05/01/2011,  04/08/2010 and 12/04/2008.  Findings: Moderate  cardiomegaly is stable as is the prominent right paratracheal soft tissue.  The hila also are prominent consistent with pulmonary arterial hypertension.  No focal infiltrate or effusion is seen.  No acute bony abnormality is noted with diffuse degenerative change throughout the thoracic spine.  IMPRESSION: Stable cardiomegaly and prominent pulmonary artery segments.  No definite active process.   Original Report Authenticated By: Dwyane Dee, M.D.   Ct Head Wo Contrast  04/27/2013   *RADIOLOGY REPORT*  Clinical Data: Syncope, atrial fibrillation  CT HEAD WITHOUT CONTRAST  Technique:  Contiguous axial images were obtained from the base of the skull through the vertex without contrast.  Comparison: None.  Findings: There is nodular mucosal thickening posterior aspect of the left maxillary sinus measures  about 2.9 cm probable mucous retention cyst.  The mastoid air cells are unremarkable.  No intracranial hemorrhage, mass effect or midline shift.  No hydrocephalus.  No acute infarction.  The gray and white matter differentiation is preserved.  No mass lesion is noted on this unenhanced scan.  IMPRESSION: No acute intracranial abnormality.  Probable mucous retention cyst left maxillary sinus measures 2.9 cm.   Original Report Authenticated By: Natasha Mead, M.D.   Dg Chest Port 1 View  05/04/2013   *RADIOLOGY REPORT*  Clinical Data: Status post right Diatek catheter insertion.  PORTABLE CHEST - 1 VIEW  Comparison: Chest x-ray 05/02/2013.  Findings: Lung volumes are low.  Patchy interstitial and airspace disease throughout the lung bases bilaterally, and in the perihilar regions.  Mild cephalization of the pulmonary vasculature. Probable small right pleural effusion.  No left pleural effusion. No pneumothorax.  Mild cardiomegaly. The patient is rotated to the right on today's exam, resulting in distortion of the mediastinal contours and reduced diagnostic sensitivity and specificity for mediastinal pathology. With  these limitations in mind, there is marked prominence of the right paratracheal soft tissues. Atherosclerosis in the thoracic aorta.  Right-sided internal jugular PermCath with tips terminating in the superior right atrium and at the superior cavoatrial junction.  IMPRESSION: 1.  Right sided PermCath appears properly located, without evidence of pneumothorax or other acute complicating features. 2.  Findings suggestive of a congestive heart failure, as above. 3.  Atherosclerosis. 4.  Prominence of the right paratracheal soft tissues similar to numerous prior examinations.  This may simply reflect vascular congestion, although underlying lymphadenopathy is difficult to exclude.  Further evaluation with contrast enhanced chest CT could be performed to exclude lymphadenopathy or mediastinal mass if clinically indicated.   Original Report Authenticated By: Trudie Reed, M.D.   Dg Chest Port 1 View  05/02/2013   *RADIOLOGY REPORT*  Clinical Data: Fever  PORTABLE CHEST - 1 VIEW  Comparison: April 27, 2013.  Findings: Stable cardiomegaly and central pulmonary vascular congestion.  No acute pulmonary disease is noted.  No pleural effusion or pneumothorax is noted.  Bony thorax is intact.  IMPRESSION: Stable cardiomegaly and central pulmonary vascular congestion.   Original Report Authenticated By: Lupita Raider.,  M.D.   Dg Fluoro Guide Cv Line-no Report  05/04/2013   CLINICAL DATA: dialysis catheter   FLOURO GUIDE CV LINE  Fluoroscopy was utilized by the requesting physician.  No radiographic  interpretation.     Medications: Scheduled Meds: . amLODipine  10 mg Oral Daily  . aspirin EC  325 mg Oral Daily  . darbepoetin  25 mcg Intravenous Q7 days  . doxercalciferol  1 mcg Intravenous Q M,W,F-HD  . heparin  5,000 Units Subcutaneous Q8H  . labetalol  200 mg Oral BID  . sevelamer carbonate  4,000 mg Oral TID WC  . sodium chloride  3 mL Intravenous Q12H  . sodium chloride  3 mL Intravenous Q12H  .  vancomycin  1,000 mg Intravenous Q M,W,F-HD      LOS: 3 days   RAI,RIPUDEEP M.D. [facility] 05/05/2013, 2:32 PM Pager: [phone]  If 7PM-7AM, please contact night-coverage [email or web address] Password [email or web address]

## 2013-05-05 NOTE — Progress Notes (Signed)
Subjective:   Doing well. Says cough is resolved, breathing is great. Some discomfort in R arm and neck but otherwise no complaints.   Objective Filed Vitals:   05/04/13 1800 05/04/13 1857 05/04/13 2017 05/05/13 0454  BP: 155/100 149/94 124/93 135/93  Pulse: 76 74 90 77  Temp: 98.3 F (36.8 C) 98.1 F (36.7 C) 98.8 F (37.1 C) 98.2 F (36.8 C)  TempSrc: Oral Oral Oral Oral  Resp: 16 19 18 16   Height:      Weight: 104.1 kg (229 lb 8 oz)   104.6 kg (230 lb 9.6 oz)  SpO2: 98% 95% 100% 95%   Physical Exam General: Alert and oriented. Sitting up in bed, no acute distress. Heart: RRR no murmur Lungs: clear and unlabored Abdomen: soft nontender, nondistended. + BSx4 Extremities: No edema. R arm with ACE intact, no drainage. Dialysis Access:  R IJ cath (placed 8/27) Dressing dry and intact   Dialysis Orders: MWF @ [facility].  4 hr 30 mins 104.5 kg 2K/2.25ca+ Heparin 3200u. RUA AVF 400 BFR 800 DFR  Profile 2  1 mcg hectorol IV/HD Epogen 3000 Units IV/HD NoVenofer    Assessment/Plan: 1. Fever/possible infected AVF- WBC 2.7  Vanc 1 gram per phamacy. 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 ESRD - MWF @ [facility]. HD pending for tomorrow. K+ 4.2 3. Anemia -  hgb 8.9, had some bleeding at cath site last night. Watch CBC, cont ESA. 4. Secondary hyperparathyroidism - ca+ 9.3.phos 4.6 renvela w meals, 1 mcg hectorol 5. HTN/volume - BP 135/93 norvasc, no volume excess. 6. Nutrition -  Alb 3.2,  renal diet. Encourage protein 7. Afib- ASA and labetalol. on tele.  Jetty Duhamel, NP [facility] [phone] 05/05/2013,10:14 AM  LOS: 3 days   Patient seen and examined.  I agree with assessment and plan as above with additions as indicated. [name]  MD Pager [phone]    Cell  [phone] 05/05/2013, 2:05 PM   Additional Objective Labs: Basic Metabolic Panel:  Recent Labs Lab 04/29/13 0732   05/02/13 2123 05/04/13 0446 05/04/13 1341  NA 136  < > 137 135 134*  K 4.0  < > 3.9 3.6 4.2  CL 97  < > 95* 95* 94*  CO2 27  < > 31 27 29   GLUCOSE 84  < > 71 97 94  BUN 37*  < > 23 43* 43*  CREATININE 9.81*  < > 6.31* 9.33* 10.24*  CALCIUM 9.8  < > 9.8 9.7 9.3  PHOS 3.9  --   --   --  4.6  < > = values in this interval not displayed. Liver Function Tests:  Recent Labs Lab 04/29/13 0500  04/30/13 0350 05/02/13 2123 05/04/13 1341  AST 16  --  24 26  --   ALT 16  --  18 17  --   ALKPHOS 129*  --  146* 152*  --   BILITOT 0.5  --  0.5 0.8  --   PROT 7.4  --  7.7 8.2  --   ALBUMIN 3.2*  < > 3.3* 3.5 3.2*  < > = values in this interval not displayed. No results found for this basename: LIPASE, AMYLASE,  in the last 168 hours CBC:  Recent Labs Lab 04/29/13 0732 05/02/13 2123 05/04/13 0446 05/04/13 1341  WBC 2.6* 4.1 2.4* 2.7*  NEUTROABS  --  3.2  --   --  HGB 9.3* 10.4* 9.1* 8.9*  HCT 27.5* 30.3* 26.9* 26.5*  MCV 88.1 86.8 88.2 88.0  PLT 165 193 174 177   Blood Culture    Component Value Date/Time   SDES TISSUE ARM RIGHT 05/04/2013 1034   SPECREQUEST RIGHT INFECTED AV FISTULA NO 3 PT ON VANCOMYCIN AND ANCEF 05/04/2013 1034   CULT  Value: NO GROWTH 1 DAY Performed at [facility] 05/04/2013 1034   REPTSTATUS PENDING 05/04/2013 1034    Cardiac Enzymes:  Recent Labs Lab 05/03/13 2254 05/04/13 0446  TROPONINI <0.30 <0.30   CBG: No results found for this basename: GLUCAP,  in the last 168 hours Iron Studies: No results found for this basename: IRON, TIBC, TRANSFERRIN, FERRITIN,  in the last 72 hours @lablastinr3 @ Studies/Results: Dg Chest Port 1 View  05/04/2013   *RADIOLOGY REPORT*  Clinical Data: Status post right Diatek catheter insertion.  PORTABLE CHEST - 1 VIEW  Comparison: Chest x-ray 05/02/2013.  Findings: Lung volumes are low.  Patchy interstitial and airspace disease throughout the lung bases bilaterally, and in the perihilar regions.  Mild  cephalization of the pulmonary vasculature. Probable small right pleural effusion.  No left pleural effusion. No pneumothorax.  Mild cardiomegaly. The patient is rotated to the right on today's exam, resulting in distortion of the mediastinal contours and reduced diagnostic sensitivity and specificity for mediastinal pathology. With these limitations in mind, there is marked prominence of the right paratracheal soft tissues. Atherosclerosis in the thoracic aorta.  Right-sided internal jugular PermCath with tips terminating in the superior right atrium and at the superior cavoatrial junction.  IMPRESSION: 1.  Right sided PermCath appears properly located, without evidence of pneumothorax or other acute complicating features. 2.  Findings suggestive of a congestive heart failure, as above. 3.  Atherosclerosis. 4.  Prominence of the right paratracheal soft tissues similar to numerous prior examinations.  This may simply reflect vascular congestion, although underlying lymphadenopathy is difficult to exclude.  Further evaluation with contrast enhanced chest CT could be performed to exclude lymphadenopathy or mediastinal mass if clinically indicated.   Original Report Authenticated By: Trudie Reed, M.D.   Dg Fluoro Guide Cv Line-no Report  05/04/2013   CLINICAL DATA: dialysis catheter   FLOURO GUIDE CV LINE  Fluoroscopy was utilized by the requesting physician.  No radiographic  interpretation.    Medications:   . amLODipine  10 mg Oral Daily  . aspirin EC  325 mg Oral Daily  . darbepoetin  25 mcg Intravenous Q7 days  . doxercalciferol  1 mcg Intravenous Q M,W,F-HD  . heparin  5,000 Units Subcutaneous Q8H  . labetalol  200 mg Oral BID  . sevelamer carbonate  4,000 mg Oral TID WC  . sodium chloride  3 mL Intravenous Q12H  . sodium chloride  3 mL Intravenous Q12H  . vancomycin  1,000 mg Intravenous Q M,W,F-HD

## 2013-05-05 NOTE — Telephone Encounter (Addendum)
Message copied by Fredrich Birks on Thu May 05, 2013  1:11 PM ------      Message from: Melene Plan      Created: Thu May 05, 2013  9:42 AM                   ----- Message -----         From: Lars Mage, PA-C         Sent: 05/05/2013   7:30 AM           To: Melene Plan, RN            Diatek placement and  Excision right Brachial Cephalic AV Fistula f/u with Dr. Darrick Penna in 2 weeks ------   05/05/13: unable to lv message, mb was full. Mailed letter to home address, dpm

## 2013-05-05 NOTE — Progress Notes (Addendum)
[facility]  Subjective  - Doing well no new complaints.   Objective 135/93 77 98.2 F (36.8 C) (Oral) 16 95%  Intake/Output Summary (Last 24 hours) at 05/05/13 0734 Last data filed at 05/04/13 2119  Gross per 24 hour  Intake    880 ml  Output   1737 ml  Net   -857 ml    Distally right UE N/V/M intact Dressing clean and dry  Assessment/Planning: POD #1Excision right Brachial Cephalic AV Fistula Diatek cath placement Plan to remove dressing tomorrow for wound inspection Pending final cultures of fistula tissue and wound cultures  [facility] 05/05/2013 7:34 AM --  Laboratory Lab Results:  Recent Labs  05/04/13 0446 05/04/13 1341  WBC 2.4* 2.7*  HGB 9.1* 8.9*  HCT 26.9* 26.5*  PLT 174 177   BMET  Recent Labs  05/04/13 0446 05/04/13 1341  NA 135 134*  K 3.6 4.2  CL 95* 94*  CO2 27 29  GLUCOSE 97 94  BUN 43* 43*  CREATININE 9.33* 10.24*  CALCIUM 9.7 9.3    COAG Lab Results  Component Value Date   INR 1.09 05/02/2013   INR 1.05 05/08/2011   No results found for this basename: PTT      Agree Follow up cultures Follow up in 2 weeks to consider new access if respiratory and fever situation resolved.  Fabienne Bruns, MD [facility] Office: [phone] Pager: [phone]

## 2013-05-06 ENCOUNTER — Encounter (HOSPITAL_COMMUNITY): Payer: Self-pay | Admitting: Vascular Surgery

## 2013-05-06 LAB — BASIC METABOLIC PANEL
CO2: 26 mEq/L (ref 19–32)
Chloride: 97 mEq/L (ref 96–112)
GFR calc Af Amer: 6 mL/min — ABNORMAL LOW (ref >90)
Potassium: 3.8 mEq/L (ref 3.5–5.1)

## 2013-05-06 LAB — CBC
HCT: 26.4 % — ABNORMAL LOW (ref 39.0–52.0)
Hemoglobin: 9 g/dL — ABNORMAL LOW (ref 13.0–17.0)
MCV: 89.2 fL (ref 78.0–100.0)
WBC: 2.6 10*3/uL — ABNORMAL LOW (ref 4.0–10.5)

## 2013-05-06 LAB — RENAL FUNCTION PANEL
BUN: 36 mg/dL — ABNORMAL HIGH (ref 6–23)
CO2: 25 mEq/L (ref 19–32)
Chloride: 97 mEq/L (ref 96–112)
Glucose, Bld: 80 mg/dL (ref 70–99)
Potassium: 3.8 mEq/L (ref 3.5–5.1)

## 2013-05-06 LAB — WOUND CULTURE

## 2013-05-06 MED ORDER — DARBEPOETIN ALFA-POLYSORBATE 25 MCG/0.42ML IJ SOLN
INTRAMUSCULAR | Status: AC
  Administered 2013-05-06: 25 ug via INTRAVENOUS
  Filled 2013-05-06: qty 0.42

## 2013-05-06 MED ORDER — SODIUM CHLORIDE 0.9 % IV SOLN: 100.0000 mL | INTRAVENOUS | Status: DC | PRN

## 2013-05-06 MED ORDER — PENTAFLUOROPROP-TETRAFLUOROETH EX AERO: 1.0000 "application " | INHALATION_SPRAY | CUTANEOUS | Status: DC | PRN

## 2013-05-06 MED ORDER — LIDOCAINE-PRILOCAINE 2.5-2.5 % EX CREA
1.0000 "application " | TOPICAL_CREAM | CUTANEOUS | Status: DC | PRN
  Filled 2013-05-06: qty 5

## 2013-05-06 MED ORDER — NEPRO/CARBSTEADY PO LIQD
237.0000 mL | ORAL | Status: DC | PRN
  Filled 2013-05-06: qty 237

## 2013-05-06 MED ORDER — OXYCODONE HCL 5 MG PO TABS
ORAL_TABLET | ORAL | Status: AC
  Filled 2013-05-06: qty 2

## 2013-05-06 MED ORDER — OXYCODONE-ACETAMINOPHEN 5-325 MG PO TABS
ORAL_TABLET | ORAL | Status: AC
  Filled 2013-05-06: qty 2

## 2013-05-06 MED ORDER — ALTEPLASE 2 MG IJ SOLR
2.0000 mg | Freq: Once | INTRAMUSCULAR | Status: DC | PRN
  Filled 2013-05-06: qty 2

## 2013-05-06 MED ORDER — DOXERCALCIFEROL 4 MCG/2ML IV SOLN
INTRAVENOUS | Status: AC
  Administered 2013-05-06: 1 ug via INTRAVENOUS
  Filled 2013-05-06: qty 2

## 2013-05-06 MED ORDER — HEPARIN SODIUM (PORCINE) 1000 UNIT/ML DIALYSIS: 1000.0000 [IU] | INTRAMUSCULAR | Status: DC | PRN

## 2013-05-06 MED ORDER — LIDOCAINE HCL (PF) 1 % IJ SOLN: 5.0000 mL | INTRAMUSCULAR | Status: DC | PRN

## 2013-05-06 NOTE — Progress Notes (Signed)
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 Called vascular surgery, patient was seen by PA, Trevor [name], dressing changed. Recommended that patient may remove his right upper arm dressing tomorrow.  ESRD:  -MWF dialysis, nephrology following for HD, today done today  A Fib/Flutter with RVR:  Rate improved -new onset, may have been precipitated by febrile illness  -Cardiac enzymes negative for acute ACS - had recent echo on 8/22 with EF 50% to 55%, no wall motion abnl and moderately dilated LA, hold off another echo at this time  - continue to monitor on tele  - his CHADS2 score is 1 and options include full dose ASA vs coumadin. Will continue ASA-change to full dose.   Anemia:  -Hemoglobin stable   HTN:  - continue home meds   Abnormal chest x-ray - Chest x-ray done today shows prominence of right paratracheal soft tissue, possibly vascular congestion,  underlying LAD cannot be ruled out. Recommended contrast-enhanced chest CT to exclude lymphadenopathy or mediastinal mass if clinically indicated. No prior CT chest. Previous chest x-ray upto 2010 had shown similar prominent right paratracheal soft tissue. Defer to PCP for further workup.  DVT Prophylaxis:  Code Status:  Disposition:  Discussed with patient in detail after the vascular surgery followup today. Patient wants to stay overnight to ensure that his bleeding from right IJ cath site won't restart. He wants DC home in  a.m.    Subjective: Patient seen after hemodialysis, complaining of bleeding at the right IJ cath site, frustrated  Objective: Weight change: 1.5 kg (3 lb 4.9 oz)  Intake/Output Summary (Last 24 hours) at 05/06/13 1549 Last data filed at 05/06/13 1118  Gross per 24 hour  Intake      0 ml  Output   2950 ml  Net  -2950 ml   Blood pressure 129/86, pulse 66, temperature 98.6 F (37 C), temperature source Oral, resp. rate 20, height 6\' 1"  (1.854 m), weight 101.9 kg (224 lb 10.4 oz), SpO2 97.00%.  Physical Exam: General: Alert and awake, oriented x3, Nad HEENT: Patient does have bright red blood on the dressing at the right IJ cath site CVS: S1-S2 clear Chest: ctab Abdomen: soft NT, ND, NBS  Extremities: no c/c/e bilaterally Right IJ placed,  Lab Results: Basic Metabolic Panel:  Recent Labs Lab 04/30/13 0350  05/04/13 1341 05/06/13 0430  NA 137  < > 134* 135  135  K 3.9  < > 4.2 3.8  3.8  CL 97  < > 94* 97  97  CO2 28  < > 29 26  25   GLUCOSE 75  < > 94 78  80  BUN 22  < > 43* 36*  36*  CREATININE 6.46*  < > 10.24* 9.15*  9.57*  CALCIUM 9.6  < > 9.3 9.3  9.3  MG 2.2  --   --   --   PHOS  --   < > 4.6 3.7  < > = values in this  interval not displayed. Liver Function Tests:  Recent Labs Lab 04/30/13 0350 05/02/13 2123 05/04/13 1341 05/06/13 0430  AST 24 26  --   --   ALT 18 17  --   --   ALKPHOS 146* 152*  --   --   BILITOT 0.5 0.8  --   --   PROT 7.7 8.2  --   --   ALBUMIN 3.3* 3.5 3.2* 3.2*   No results found for this basename: LIPASE, AMYLASE,  in the last 168 hours No results found for this basename: AMMONIA,  in the last 168 hours CBC:  Recent Labs Lab 05/02/13 2123  05/04/13 1341 05/06/13 0430  WBC 4.1  < > 2.7* 2.6*  NEUTROABS 3.2  --   --   --   HGB 10.4*  < > 8.9* 9.0*  HCT 30.3*  < > 26.5* 26.4*  MCV 86.8  < > 88.0 89.2  PLT 193  < > 177 160  < > = values in this interval not displayed. Cardiac Enzymes:  Recent Labs Lab  05/03/13 2254 05/04/13 0446  TROPONINI <0.30 <0.30   BNP: No components found with this basename: POCBNP,  CBG: No results found for this basename: GLUCAP,  in the last 168 hours   Micro Results: Recent Results (from the past 240 hour(s))  CULTURE, BLOOD (ROUTINE X 2)     Status: None   Collection Time    05/02/13  9:05 PM      Result Value Range Status   Specimen Description BLOOD LEFT ARM   Final   Special Requests BOTTLES DRAWN AEROBIC AND ANAEROBIC 10CC EACH   Final   Culture  Setup Time     Final   Value: 05/03/2013 01:59     Performed at [facility]   Culture     Final   Value:        BLOOD CULTURE RECEIVED NO GROWTH TO DATE CULTURE WILL BE HELD FOR 5 DAYS BEFORE ISSUING A FINAL NEGATIVE REPORT     Performed at [facility]   Report Status PENDING   Incomplete  CULTURE, BLOOD (ROUTINE X 2)     Status: None   Collection Time    05/02/13  9:15 PM      Result Value Range Status   Specimen Description BLOOD LEFT ARM   Final   Special Requests BOTTLES DRAWN AEROBIC ONLY 10CC   Final   Culture  Setup Time     Final   Value: 05/03/2013 01:58     Performed at [facility]   Culture     Final   Value:        BLOOD CULTURE RECEIVED NO GROWTH TO DATE CULTURE WILL BE HELD FOR 5 DAYS BEFORE ISSUING A FINAL NEGATIVE REPORT     Performed at [facility]   Report Status PENDING   Incomplete  CULTURE, EXPECTORATED SPUTUM-ASSESSMENT     Status: None   Collection Time    05/03/13 10:07 AM      Result Value Range Status   Specimen Description SPUTUM   Final   Special Requests Normal   Final   Sputum evaluation     Final   Value: THIS SPECIMEN IS ACCEPTABLE. RESPIRATORY CULTURE REPORT TO FOLLOW.   Report Status 05/03/2013 FINAL   Final  CULTURE, RESPIRATORY (NON-EXPECTORATED)     Status: None   Collection Time    05/03/13 10:07 AM      Result Value  Range Status   Specimen Description SPUTUM   Final   Special Requests NONE   Final   Gram Stain      Final   Value: FEW WBC PRESENT,BOTH PMN AND MONONUCLEAR     RARE SQUAMOUS EPITHELIAL CELLS PRESENT     RARE YEAST     RARE GRAM POSITIVE RODS     RARE GRAM POSITIVE COCCI   Culture     Final   Value: NORMAL OROPHARYNGEAL FLORA     Performed at [facility]   Report Status 05/05/2013 FINAL   Final  WOUND CULTURE     Status: None   Collection Time    05/04/13 10:26 AM      Result Value Range Status   Specimen Description WOUND ARM RIGHT   Final   Special Requests RIGHT AV FISTULA PT ON VANCOMYCIN AND ANCEF   Final   Gram Stain     Final   Value: NO WBC SEEN     NO SQUAMOUS EPITHELIAL CELLS SEEN     NO ORGANISMS SEEN     Performed at [facility]   Culture     Final   Value: NO GROWTH 2 DAYS     Performed at [facility]   Report Status 05/06/2013 FINAL   Final  TISSUE CULTURE     Status: None   Collection Time    05/04/13 10:33 AM      Result Value Range Status   Specimen Description TISSUE ARM RIGHT   Final   Special Requests     Final   Value: INFECTED RIGHT AV FISTULA NO 2 PT ON VANCOMYCIN AND ANCEF   Gram Stain     Final   Value: NO WBC SEEN     NO ORGANISMS SEEN     Performed at [facility]   Culture     Final   Value: NO GROWTH 2 DAYS     Performed at [facility]   Report Status PENDING   Incomplete  TISSUE CULTURE     Status: None   Collection Time    05/04/13 10:34 AM      Result Value Range Status   Specimen Description TISSUE ARM RIGHT   Final   Special Requests     Final   Value: RIGHT INFECTED AV FISTULA NO 3 PT ON VANCOMYCIN AND ANCEF   Gram Stain     Final   Value: NO WBC SEEN     NO ORGANISMS SEEN     Performed at [facility]   Culture     Final   Value: MULTIPLE ORGANISMS PRESENT, NONE PREDOMINANT     Performed at [facility]   Report Status PENDING   Incomplete    Studies/Results: Dg Chest 2 View  04/27/2013   *RADIOLOGY REPORT*  Clinical Data: Cough, history of MI, hypertension   CHEST - 2 VIEW  Comparison: 04/21/2013  Findings: Cardiomegaly again noted.  Again noted significant central vascular congestion.  No acute infiltrate or pleural effusion.  No pulmonary edema.  Stable right paratracheal soft tissue prominence.  IMPRESSION: No active disease.  Cardiomegaly. Central vascular congestion again noted.  No significant change   Original Report Authenticated By: Natasha Mead, M.D.   Dg Chest 2 View  04/21/2013   *RADIOLOGY REPORT*  Clinical Data: Cough for 2 weeks, dialysis patient, former smoking history  CHEST - 2 VIEW  Comparison: Chest x-ray of 05/01/2011,  04/08/2010 and 12/04/2008.  Findings: Moderate cardiomegaly is stable as is the prominent right paratracheal soft tissue.  The hila also are prominent consistent with pulmonary arterial hypertension.  No focal infiltrate or effusion is seen.  No acute bony abnormality is noted with diffuse degenerative change throughout the thoracic spine.  IMPRESSION: Stable cardiomegaly and prominent pulmonary artery segments.  No definite active process.   Original Report Authenticated By: Dwyane Dee, M.D.   Ct Head Wo Contrast  04/27/2013   *RADIOLOGY REPORT*  Clinical Data: Syncope, atrial fibrillation  CT HEAD WITHOUT CONTRAST  Technique:  Contiguous axial images were obtained from the base of the skull through the vertex without contrast.  Comparison: None.  Findings: There is nodular mucosal thickening posterior aspect of the left maxillary sinus measures about 2.9 cm probable mucous retention cyst.  The mastoid air cells are unremarkable.  No intracranial hemorrhage, mass effect or midline shift.  No hydrocephalus.  No acute infarction.  The gray and white matter differentiation is preserved.  No mass lesion is noted on this unenhanced scan.  IMPRESSION: No acute intracranial abnormality.  Probable mucous retention cyst left maxillary sinus measures 2.9 cm.   Original Report Authenticated By: Natasha Mead, M.D.   Dg Chest Port 1  View  05/04/2013   *RADIOLOGY REPORT*  Clinical Data: Status post right Diatek catheter insertion.  PORTABLE CHEST - 1 VIEW  Comparison: Chest x-ray 05/02/2013.  Findings: Lung volumes are low.  Patchy interstitial and airspace disease throughout the lung bases bilaterally, and in the perihilar regions.  Mild cephalization of the pulmonary vasculature. Probable small right pleural effusion.  No left pleural effusion. No pneumothorax.  Mild cardiomegaly. The patient is rotated to the right on today's exam, resulting in distortion of the mediastinal contours and reduced diagnostic sensitivity and specificity for mediastinal pathology. With these limitations in mind, there is marked prominence of the right paratracheal soft tissues. Atherosclerosis in the thoracic aorta.  Right-sided internal jugular PermCath with tips terminating in the superior right atrium and at the superior cavoatrial junction.  IMPRESSION: 1.  Right sided PermCath appears properly located, without evidence of pneumothorax or other acute complicating features. 2.  Findings suggestive of a congestive heart failure, as above. 3.  Atherosclerosis. 4.  Prominence of the right paratracheal soft tissues similar to numerous prior examinations.  This may simply reflect vascular congestion, although underlying lymphadenopathy is difficult to exclude.  Further evaluation with contrast enhanced chest CT could be performed to exclude lymphadenopathy or mediastinal mass if clinically indicated.   Original Report Authenticated By: Trudie Reed, M.D.   Dg Chest Port 1 View  05/02/2013   *RADIOLOGY REPORT*  Clinical Data: Fever  PORTABLE CHEST - 1 VIEW  Comparison: April 27, 2013.  Findings: Stable cardiomegaly and central pulmonary vascular congestion.  No acute pulmonary disease is noted.  No pleural effusion or pneumothorax is noted.  Bony thorax is intact.  IMPRESSION: Stable cardiomegaly and central pulmonary vascular congestion.   Original Report  Authenticated By: Lupita Raider.,  M.D.   Dg Fluoro Guide Cv Line-no Report  05/04/2013   CLINICAL DATA: dialysis catheter   FLOURO GUIDE CV LINE  Fluoroscopy was utilized by the requesting physician.  No radiographic  interpretation.     Medications: Scheduled Meds: . amLODipine  10 mg Oral Daily  . aspirin EC  325 mg Oral Daily  . darbepoetin  25 mcg Intravenous Q7 days  . doxercalciferol  1 mcg Intravenous Q M,W,F-HD  . heparin  5,000 Units Subcutaneous Q8H  .  labetalol  200 mg Oral BID  . sevelamer carbonate  4,000 mg Oral TID WC  . sodium chloride  3 mL Intravenous Q12H  . sodium chloride  3 mL Intravenous Q12H  . vancomycin  1,000 mg Intravenous Q M,W,F-HD      LOS: 4 days   Renell Coaxum M.D. [facility] 05/06/2013, 3:49 PM Pager: [phone]  If 7PM-7AM, please contact night-coverage [email or web address] Password [email or web address]

## 2013-05-06 NOTE — Progress Notes (Signed)
ANTIBIOTIC CONSULT NOTE  Pharmacy Consult for vancomycin Indication: suspected endovascular infection  No Known Allergies  Labs:  Recent Labs  05/04/13 0446 05/04/13 1341 05/06/13 0430  WBC 2.4* 2.7* 2.6*  HGB 9.1* 8.9* 9.0*  PLT 174 177 160  CREATININE 9.33* 10.24* 9.15*  9.57*   Estimated Creatinine Clearance: 10.9 ml/min (by C-G formula based on Cr of 9.15). No results found for this basename: VANCOTROUGH, VANCOPEAK, VANCORANDOM, GENTTROUGH, GENTPEAK, GENTRANDOM, TOBRATROUGH, TOBRAPEAK, TOBRARND, AMIKACINPEAK, AMIKACINTROU, AMIKACIN,  in the last 72 hours   Microbiology: Recent Results (from the past 720 hour(s))  CULTURE, BLOOD (ROUTINE X 2)     Status: None   Collection Time    05/02/13  9:05 PM      Result Value Range Status   Specimen Description BLOOD LEFT ARM   Final   Special Requests BOTTLES DRAWN AEROBIC AND ANAEROBIC 10CC EACH   Final   Culture  Setup Time     Final   Value: 05/03/2013 01:59     Performed at [facility]   Culture     Final   Value:        BLOOD CULTURE RECEIVED NO GROWTH TO DATE CULTURE WILL BE HELD FOR 5 DAYS BEFORE ISSUING A FINAL NEGATIVE REPORT     Performed at [facility]   Report Status PENDING   Incomplete  CULTURE, BLOOD (ROUTINE X 2)     Status: None   Collection Time    05/02/13  9:15 PM      Result Value Range Status   Specimen Description BLOOD LEFT ARM   Final   Special Requests BOTTLES DRAWN AEROBIC ONLY 10CC   Final   Culture  Setup Time     Final   Value: 05/03/2013 01:58     Performed at [facility]   Culture     Final   Value:        BLOOD CULTURE RECEIVED NO GROWTH TO DATE CULTURE WILL BE HELD FOR 5 DAYS BEFORE ISSUING A FINAL NEGATIVE REPORT     Performed at [facility]   Report Status PENDING   Incomplete  CULTURE, EXPECTORATED SPUTUM-ASSESSMENT     Status: None   Collection Time    05/03/13 10:07 AM      Result Value Range Status   Specimen Description SPUTUM   Final   Special Requests Normal   Final   Sputum evaluation     Final   Value: THIS SPECIMEN IS ACCEPTABLE. RESPIRATORY CULTURE REPORT TO FOLLOW.   Report Status 05/03/2013 FINAL   Final  CULTURE, RESPIRATORY (NON-EXPECTORATED)     Status: None   Collection Time    05/03/13 10:07 AM      Result Value Range Status   Specimen Description SPUTUM   Final   Special Requests NONE   Final   Gram Stain     Final   Value: FEW WBC PRESENT,BOTH PMN AND MONONUCLEAR     RARE SQUAMOUS EPITHELIAL CELLS PRESENT     RARE YEAST     RARE GRAM POSITIVE RODS     RARE GRAM POSITIVE COCCI   Culture     Final   Value: NORMAL OROPHARYNGEAL FLORA     Performed at [facility]   Report Status 05/05/2013 FINAL   Final  WOUND CULTURE     Status: None   Collection Time    05/04/13 10:26 AM      Result Value Range Status  Specimen Description WOUND ARM RIGHT   Final   Special Requests RIGHT AV FISTULA PT ON VANCOMYCIN AND ANCEF   Final   Gram Stain     Final   Value: NO WBC SEEN     NO SQUAMOUS EPITHELIAL CELLS SEEN     NO ORGANISMS SEEN     Performed at [facility]   Culture     Final   Value: NO GROWTH 2 DAYS     Performed at [facility]   Report Status 05/06/2013 FINAL   Final  TISSUE CULTURE     Status: None   Collection Time    05/04/13 10:33 AM      Result Value Range Status   Specimen Description TISSUE ARM RIGHT   Final   Special Requests     Final   Value: INFECTED RIGHT AV FISTULA NO 2 PT ON VANCOMYCIN AND ANCEF   Gram Stain     Final   Value: NO WBC SEEN     NO ORGANISMS SEEN     Performed at [facility]   Culture     Final   Value: NO GROWTH 2 DAYS     Performed at [facility]   Report Status PENDING   Incomplete  TISSUE CULTURE     Status: None   Collection Time    05/04/13 10:34 AM      Result Value Range Status   Specimen Description TISSUE ARM RIGHT   Final   Special Requests     Final   Value: RIGHT INFECTED AV FISTULA NO 3 PT ON  VANCOMYCIN AND ANCEF   Gram Stain     Final   Value: NO WBC SEEN     NO ORGANISMS SEEN     Performed at [facility]   Culture     Final   Value: MULTIPLE ORGANISMS PRESENT, NONE PREDOMINANT     Performed at [facility]   Report Status PENDING   Incomplete     Assessment: 62 year old man admitted for fever and possible infected AVF to continue on vancomycin.    Goal of Therapy:  Pre HD level 15-25  Plan:  Vancomycin 1g after HD (on MWF schedule) Monitor cultures with medical team.  Elwin Sleight 05/06/2013,9:50 AM

## 2013-05-06 NOTE — Procedures (Signed)
I was present at this dialysis session. I have reviewed the session itself and made appropriate changes.   [name]  MD Pager [phone]    Cell  [phone] 05/06/2013, 11:13 AM

## 2013-05-06 NOTE — Progress Notes (Signed)
Subjective:   Feeling well, should be going home today. Breathing still good, no bleeding from cath site. Some arm tenderness but tolerable. No complaints  Objective Filed Vitals:   05/06/13 0730 05/06/13 0800 05/06/13 0830 05/06/13 0930  BP: 150/100 140/97 174/89 146/92  Pulse: 67 79 84 69  Temp:      TempSrc:      Resp: 18 13 18 19   Height:      Weight:      SpO2:       Physical Exam General: Alert and oriented. On HD, no acute distress Heart: RRR Lungs: clear and unlabored. Abdomen: soft nontender, nondistended. + BS Extremities: No edema, R arm with ACE dry and intact. Dialysis Access:  R IJ cath (placed 8/27)  Dialysis Orders: MWF @ [facility].  4 hr 30 mins 104.5 kg 2K/2.25ca+ Heparin 3200u. RUA AVF 400 BFR 800 DFR  Profile 2  1 mcg hectorol IV/HD Epogen 3000 Units IV/HD NoVenofer    Assessment/Plan: 1. Fever/possible infected AVF- WBC 2.6 Vanc 1 gram per pharmacy- can cont at HD at DC. OR 8/27 for excision of AVF and Segments of fistula aneurysm cultured- negative to date. Blood culture no growth to date 2. ESRD -MWF @ [facility]. HD today. uf goal 3500. K+ 3.8 3. Anemia -  hgb 9. Cont ESA. Watch CBC. No more bleeding from cath site. 4. Secondary hyperparathyroidism - Ca+ 9.3 phos 3.7  hectorol 5. HTN/volume -  BP 146/92. Norvasc 10mg . No edema 6. Nutrition -  Alb 3.2  Renal diet. Protein encouraged. 7. Afib- ASA and labetalol.   Jetty Duhamel, NP [facility] Beeper [phone] 05/06/2013,9:50 AM  LOS: 4 days   Patient seen and examined.  I agree with assessment and plan as above with additions as indicated.  All cultures are negative, AVF ligated and partially resected, has new tunneled HD cath, afebrile since 103 temp day of admission. OK for discharge, will treat w empiric Vanc x 2wks at outpatient HD for infected AVF buttonholes, s/p resection.   [name]  MD Pager [phone]    Cell  [phone] 05/06/2013, 11:31 AM   Additional  Objective Labs: Basic Metabolic Panel:  Recent Labs Lab 05/04/13 0446 05/04/13 1341 05/06/13 0430  NA 135 134* 135  135  K 3.6 4.2 3.8  3.8  CL 95* 94* 97  97  CO2 27 29 26  25   GLUCOSE 97 94 78  80  BUN 43* 43* 36*  36*  CREATININE 9.33* 10.24* 9.15*  9.57*  CALCIUM 9.7 9.3 9.3  9.3  PHOS  --  4.6 3.7   Liver Function Tests:  Recent Labs Lab 04/30/13 0350 05/02/13 2123 05/04/13 1341 05/06/13 0430  AST 24 26  --   --   ALT 18 17  --   --   ALKPHOS 146* 152*  --   --   BILITOT 0.5 0.8  --   --   PROT 7.7 8.2  --   --   ALBUMIN 3.3* 3.5 3.2* 3.2*   No results found for this basename: LIPASE, AMYLASE,  in the last 168 hours CBC:  Recent Labs Lab 05/02/13 2123 05/04/13 0446 05/04/13 1341 05/06/13 0430  WBC 4.1 2.4* 2.7* 2.6*  NEUTROABS 3.2  --   --   --   HGB 10.4* 9.1* 8.9* 9.0*  HCT 30.3* 26.9* 26.5* 26.4*  MCV 86.8 88.2 88.0 89.2  PLT 193 174 177 160   Blood Culture    Component  Value Date/Time   SDES TISSUE ARM RIGHT 05/04/2013 1034   SPECREQUEST RIGHT INFECTED AV FISTULA NO 3 PT ON VANCOMYCIN AND ANCEF 05/04/2013 1034   CULT  Value: MULTIPLE ORGANISMS PRESENT, NONE PREDOMINANT Performed at [facility] 05/04/2013 1034   REPTSTATUS PENDING 05/04/2013 1034    Cardiac Enzymes:  Recent Labs Lab 05/03/13 2254 05/04/13 0446  TROPONINI <0.30 <0.30   CBG: No results found for this basename: GLUCAP,  in the last 168 hours Iron Studies: No results found for this basename: IRON, TIBC, TRANSFERRIN, FERRITIN,  in the last 72 hours @lablastinr3 @ Studies/Results: Dg Chest Port 1 View  05/04/2013   *RADIOLOGY REPORT*  Clinical Data: Status post right Diatek catheter insertion.  PORTABLE CHEST - 1 VIEW  Comparison: Chest x-ray 05/02/2013.  Findings: Lung volumes are low.  Patchy interstitial and airspace disease throughout the lung bases bilaterally, and in the perihilar regions.  Mild cephalization of the pulmonary vasculature. Probable small  right pleural effusion.  No left pleural effusion. No pneumothorax.  Mild cardiomegaly. The patient is rotated to the right on today's exam, resulting in distortion of the mediastinal contours and reduced diagnostic sensitivity and specificity for mediastinal pathology. With these limitations in mind, there is marked prominence of the right paratracheal soft tissues. Atherosclerosis in the thoracic aorta.  Right-sided internal jugular PermCath with tips terminating in the superior right atrium and at the superior cavoatrial junction.  IMPRESSION: 1.  Right sided PermCath appears properly located, without evidence of pneumothorax or other acute complicating features. 2.  Findings suggestive of a congestive heart failure, as above. 3.  Atherosclerosis. 4.  Prominence of the right paratracheal soft tissues similar to numerous prior examinations.  This may simply reflect vascular congestion, although underlying lymphadenopathy is difficult to exclude.  Further evaluation with contrast enhanced chest CT could be performed to exclude lymphadenopathy or mediastinal mass if clinically indicated.   Original Report Authenticated By: Trudie Reed, M.D.   Medications:   . amLODipine  10 mg Oral Daily  . aspirin EC  325 mg Oral Daily  . darbepoetin  25 mcg Intravenous Q7 days  . doxercalciferol  1 mcg Intravenous Q M,W,F-HD  . heparin  5,000 Units Subcutaneous Q8H  . labetalol  200 mg Oral BID  . sevelamer carbonate  4,000 mg Oral TID WC  . sodium chloride  3 mL Intravenous Q12H  . sodium chloride  3 mL Intravenous Q12H  . vancomycin  1,000 mg Intravenous Q M,W,F-HD

## 2013-05-07 LAB — TISSUE CULTURE: Gram Stain: NONE SEEN

## 2013-05-07 MED ORDER — OXYCODONE HCL 5 MG PO TABS: 5.0000 mg | ORAL_TABLET | Freq: Four times a day (QID) | ORAL | Status: DC | PRN

## 2013-05-07 MED ORDER — VANCOMYCIN HCL IN DEXTROSE 1-5 GM/200ML-% IV SOLN: 1000.0000 mg | INTRAVENOUS | Status: DC

## 2013-05-07 MED ORDER — ASPIRIN 325 MG PO TBEC: 325.0000 mg | DELAYED_RELEASE_TABLET | Freq: Every day | ORAL | Status: DC

## 2013-05-07 MED ORDER — PHENOL 1.4 % MT LIQD: 1.0000 | OROMUCOSAL | Status: DC | PRN

## 2013-05-07 NOTE — Progress Notes (Signed)
Vascular Surgery Asked to check dressing on Diatek catheter.  No active bleeding Dressing changed OK to D/C from our standpoint. Cari Caraway

## 2013-05-07 NOTE — Discharge Summary (Signed)
Physician Discharge Summary  Patient ID: BRAND SIEVER MRN: [id number] DOB/AGE: 09-29-1950 62 y.o.  Admit date: 05/02/2013 Discharge date: 05/07/2013  Primary Care Physician:  Irena Cords, MD  Discharge Diagnoses:      . HYPERTENSION . Atrial fibrillation- new onset  . Fever, unspecified, likely due to infection of AVF Status post Excision right Brachial Cephalic AV Fistula Diatek cath placement End-stage renal disease on hemodialysis MWF Anemia of chronic disease    Consults:  Nephrology, Dr. Arlean Hopping                    Vascular surgery, Dr. Darrick Penna   Recommendations for Outpatient Follow-up:  1. patient had new onset atrial fibrillation this admission, CHADS 1, placed on aspirin 325 mg daily. He did undergo excision of right brachial cephalic AV fistula and diatek cath placement, anticoagulation with Coumadin or newer agents could have caused bleeding risk. Please follow or refer to cardiology outpatient.   2.Chest x-ray done 8/27 showed prominence of right paratracheal soft tissue, possibly vascular congestion, underlying LAD cannot be ruled out. Recommended contrast-enhanced chest CT to exclude lymphadenopathy or mediastinal mass if clinically indicated. No prior CT chest. Previous chest x-rays upto 2010 had shown similar prominent right paratracheal soft tissue, so this was not pursued inpatient. Patient had no respiratory symptoms., please follow outpatient.  3. patient is placed on vancomycin with hemodialysis for 2 weeks, please follow cultures.  4. Vascular surgery to consider new access at followup appointment  Allergies:  No Known Allergies   Discharge Medications:   Medication List         amLODipine 10 MG tablet  Commonly known as:  NORVASC  Take 10 mg by mouth daily.     Arginine 1000 MG Tabs  Take 2,000 mg by mouth daily.     aspirin 325 MG EC tablet  Take 1 tablet (325 mg total) by mouth daily.     oxyCODONE 5 MG immediate release tablet   Commonly known as:  Oxy IR/ROXICODONE  Take 1-2 tablets (5-10 mg total) by mouth every 6 (six) hours as needed for pain.     phenol 1.4 % Liqd  Commonly known as:  CHLORASEPTIC  Use as directed 1 spray in the mouth or throat as needed (for sore throat).     sevelamer carbonate 800 MG tablet  Commonly known as:  RENVELA  Take 4,000 mg by mouth 3 (three) times daily with meals.     TRANDATE 200 MG tablet  Generic drug:  labetalol  Take 200 mg by mouth 2 (two) times daily.     vancomycin 1 GM/200ML Soln  Commonly known as:  VANCOCIN  Inject 200 mLs (1,000 mg total) into the vein every Monday, Wednesday, and Friday with hemodialysis. X 2WEEKS         Brief H and P: For complete details please refer to admission H and P, but in brief patient is a 62 year old male with recent admission for syncope. He presented with fevers. Patient reported that he went to hemodialysis on the day of admission and after getting home developed chills and fever of 102. Temperature in ED was 103. Patient also to call for about one month, occasional blood tinge in. Patient was started on amoxicillin outpatient. He also reported pain from AV fistula in the right upper extremity. patient was admitted for further workup.  Hospital Course:   Fever: likely due to infection of AVF, resolved and remains afebrile. CXR and UA neg for infection.  blood, sputum culture negative so far. Vascular  surgery was consulted and patient underwent excision of right Brachial Cephalic aVF and placement of right IJ cath on 05/04/13.  Cultures from the right infected AV fistula showed multiple organisms, none predominant, no staph aureus, no group A strept. Pain is controlled with oxycodone and dilaudid while inpatient. On 05/06/13, patient was complaining of bleeding at the right IJ cath site after hemodialysis. Patient was seen by PA, Ms. Mellissa Kohut from vascular surgery and dressing was changed. Recommended that patient may remove  his right upper arm dressing on 8/30. He has a followup appointment with vascular surgery on 05/19/13 at 8:30 am. Dr Arlean Hopping recommended continuing vancomycin with HD for 2 weeks.  ESRD: Nephrology was consulted and patient continued to have hemodialysis MWF per his schedule. nephrology following for HD, today done today   A Fib/Flutter with RVR: Rate controlled, new onset, may have been precipitated by acute febrile illness. Cardiac enzymes remained negative for acute ACS. He had recent echo on 8/22 with EF 50% to 55%, no wall motion abnl and moderately dilated LA. CHADS2 score is 1 and options include full dose ASA vs coumadin. He did undergo excision of right brachial cephalic AV fistula and diatek cath placement, anticoagulation with Coumadin or newer agents would have caused increased bleeding risk.   Anemia: Hemoglobin stable   HTN: - continue home meds   Abnormal chest x-ray  - Chest x-ray on 8/27 showed prominence of right paratracheal soft tissue, possibly vascular congestion, underlying LAD cannot be ruled out. Recommended contrast-enhanced chest CT to exclude lymphadenopathy or mediastinal mass if clinically indicated. No prior CT chest. Previous chest x-rays upto 2010 had shown similar prominent right paratracheal soft tissue. Defer to PCP for further workup. Patient had no acute respiratory symptoms inpatient.   Day of Discharge BP 149/93  Pulse 78  Temp(Src) 98 F (36.7 C) (Oral)  Resp 18  Ht 6\' 1"  (1.854 m)  Wt 104.4 kg (230 lb 2.6 oz)  BMI 30.37 kg/m2  SpO2 99%  Physical Exam:   General: Alert and awake, oriented x3, NAD HEENT: dressing at the right IJ cath site intact, no acute bleeding CVS: S1-S2 clear  Chest: ctab  Abdomen: soft NT, ND, NBS  Extremities: no c/c/e bilaterally, dressing on the right arm intact  Right IJ placed  The results of significant diagnostics from this hospitalization (including imaging, microbiology, ancillary and laboratory) are listed  below for reference.    LAB RESULTS: Basic Metabolic Panel:  Recent Labs Lab 05/04/13 1341 05/06/13 0430  NA 134* 135  135  K 4.2 3.8  3.8  CL 94* 97  97  CO2 29 26  25   GLUCOSE 94 78  80  BUN 43* 36*  36*  CREATININE 10.24* 9.15*  9.57*  CALCIUM 9.3 9.3  9.3  PHOS 4.6 3.7   Liver Function Tests:  Recent Labs Lab 05/02/13 2123 05/04/13 1341 05/06/13 0430  AST 26  --   --   ALT 17  --   --   ALKPHOS 152*  --   --   BILITOT 0.8  --   --   PROT 8.2  --   --   ALBUMIN 3.5 3.2* 3.2*   No results found for this basename: LIPASE, AMYLASE,  in the last 168 hours No results found for this basename: AMMONIA,  in the last 168 hours CBC:  Recent Labs Lab 05/02/13 2123  05/04/13 1341 05/06/13 0430  WBC 4.1  < >  2.7* 2.6*  NEUTROABS 3.2  --   --   --   HGB 10.4*  < > 8.9* 9.0*  HCT 30.3*  < > 26.5* 26.4*  MCV 86.8  < > 88.0 89.2  PLT 193  < > 177 160  < > = values in this interval not displayed. Cardiac Enzymes:  Recent Labs Lab 05/03/13 2254 05/04/13 0446  TROPONINI <0.30 <0.30   BNP: No components found with this basename: POCBNP,  CBG: No results found for this basename: GLUCAP,  in the last 168 hours  Significant Diagnostic Studies:  Dg Chest Port 1 View  05/02/2013   *RADIOLOGY REPORT*  Clinical Data: Fever  PORTABLE CHEST - 1 VIEW  Comparison: April 27, 2013.  Findings: Stable cardiomegaly and central pulmonary vascular congestion.  No acute pulmonary disease is noted.  No pleural effusion or pneumothorax is noted.  Bony thorax is intact.  IMPRESSION: Stable cardiomegaly and central pulmonary vascular congestion.   Original Report Authenticated By: Lupita Raider.,  M.D.     Disposition and Follow-up:      Future Appointments Provider Department Dept Phone   05/19/2013 8:30 AM [name], MD [facility] [phone]       DISPOSITION:Home  DIET: Renal diet  ACTIVITY: As tolerated  TESTS THAT NEED  FOLLOW-UP Blood cultures, so far negative  DISCHARGE FOLLOW-UP Follow-up Information   Follow up with [name], MD On 05/19/2013. (at 8:30am)    Specialty:  Vascular Surgery   Contact information:   [address] [place] [address] [phone]       Follow up with Irena Cords, MD On 05/09/2013. (please keep your appt)    Specialty:  Nephrology   Contact information:   [address] [facility] [place] [address] [phone]       Time spent on Discharge: 45 mins  Signed:   Debraann Livingstone M.D. [facility] 05/07/2013, 9:32 AM Pager: [phone]

## 2013-05-08 DIAGNOSIS — N186 End stage renal disease: Secondary | ICD-10-CM | POA: Diagnosis not present

## 2013-05-09 DIAGNOSIS — N2581 Secondary hyperparathyroidism of renal origin: Secondary | ICD-10-CM | POA: Diagnosis not present

## 2013-05-09 DIAGNOSIS — N186 End stage renal disease: Secondary | ICD-10-CM | POA: Diagnosis not present

## 2013-05-09 DIAGNOSIS — R509 Fever, unspecified: Secondary | ICD-10-CM | POA: Diagnosis not present

## 2013-05-09 DIAGNOSIS — D631 Anemia in chronic kidney disease: Secondary | ICD-10-CM | POA: Diagnosis not present

## 2013-05-09 DIAGNOSIS — T82598A Other mechanical complication of other cardiac and vascular devices and implants, initial encounter: Secondary | ICD-10-CM | POA: Diagnosis not present

## 2013-05-09 LAB — CULTURE, BLOOD (ROUTINE X 2): Culture: NO GROWTH

## 2013-05-11 DIAGNOSIS — T82598A Other mechanical complication of other cardiac and vascular devices and implants, initial encounter: Secondary | ICD-10-CM | POA: Diagnosis not present

## 2013-05-11 DIAGNOSIS — N2581 Secondary hyperparathyroidism of renal origin: Secondary | ICD-10-CM | POA: Diagnosis not present

## 2013-05-11 DIAGNOSIS — D631 Anemia in chronic kidney disease: Secondary | ICD-10-CM | POA: Diagnosis not present

## 2013-05-11 DIAGNOSIS — N186 End stage renal disease: Secondary | ICD-10-CM | POA: Diagnosis not present

## 2013-05-11 DIAGNOSIS — R509 Fever, unspecified: Secondary | ICD-10-CM | POA: Diagnosis not present

## 2013-05-13 DIAGNOSIS — N186 End stage renal disease: Secondary | ICD-10-CM | POA: Diagnosis not present

## 2013-05-13 DIAGNOSIS — R509 Fever, unspecified: Secondary | ICD-10-CM | POA: Diagnosis not present

## 2013-05-13 DIAGNOSIS — N2581 Secondary hyperparathyroidism of renal origin: Secondary | ICD-10-CM | POA: Diagnosis not present

## 2013-05-13 DIAGNOSIS — D631 Anemia in chronic kidney disease: Secondary | ICD-10-CM | POA: Diagnosis not present

## 2013-05-13 DIAGNOSIS — T82598A Other mechanical complication of other cardiac and vascular devices and implants, initial encounter: Secondary | ICD-10-CM | POA: Diagnosis not present

## 2013-05-16 DIAGNOSIS — N2581 Secondary hyperparathyroidism of renal origin: Secondary | ICD-10-CM | POA: Diagnosis not present

## 2013-05-16 DIAGNOSIS — D631 Anemia in chronic kidney disease: Secondary | ICD-10-CM | POA: Diagnosis not present

## 2013-05-16 DIAGNOSIS — T82598A Other mechanical complication of other cardiac and vascular devices and implants, initial encounter: Secondary | ICD-10-CM | POA: Diagnosis not present

## 2013-05-16 DIAGNOSIS — N186 End stage renal disease: Secondary | ICD-10-CM | POA: Diagnosis not present

## 2013-05-16 DIAGNOSIS — R509 Fever, unspecified: Secondary | ICD-10-CM | POA: Diagnosis not present

## 2013-05-17 ENCOUNTER — Encounter: Payer: Self-pay | Admitting: Internal Medicine

## 2013-05-17 ENCOUNTER — Ambulatory Visit (INDEPENDENT_AMBULATORY_CARE_PROVIDER_SITE_OTHER): Payer: Medicare Other | Admitting: Internal Medicine

## 2013-05-17 VITALS — BP 128/80 | HR 64 | Temp 98.0°F | Ht 73.5 in | Wt 231.2 lb

## 2013-05-17 DIAGNOSIS — R042 Hemoptysis: Secondary | ICD-10-CM | POA: Insufficient documentation

## 2013-05-17 DIAGNOSIS — R05 Cough: Secondary | ICD-10-CM | POA: Insufficient documentation

## 2013-05-17 DIAGNOSIS — I10 Essential (primary) hypertension: Secondary | ICD-10-CM

## 2013-05-17 MED ORDER — NEBIVOLOL HCL 10 MG PO TABS: ORAL_TABLET | ORAL | Status: DC

## 2013-05-17 NOTE — Assessment & Plan Note (Signed)
Strongly prefer in this setting: Bystolic, the most beta -1  selective Beta blocker available in sample form, with bisoprolol the most selective generic choice  on the market.  Try samples of bystolic 10 mg twice daily and regroup in 2 weeks

## 2013-05-17 NOTE — Assessment & Plan Note (Signed)
Amt is minimal vs the violence of the cough he describes > main issue is should hold asa if actively bleeding

## 2013-05-17 NOTE — Progress Notes (Signed)
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 Dx with sarcoid in [place]  in the 1980s but never on prednisone on HD x 2011 referred by Dr Julaine Fusi 05/17/2013 for eval of severe cough to point of passing out > admit:  Admit date: 05/02/2013  Discharge date: 05/07/2013  Primary Care Physician: Irena Cords, MD  Discharge Diagnoses:   . HYPERTENSION . Atrial fibrillation- new onset  . Fever, unspecified, likely due to infection of AVF  Status post Excision right Brachial Cephalic AV Fistula  Diatek cath placement  End-stage renal disease on hemodialysis MWF  Anemia of chronic disease  Consults: Nephrology, Dr. Arlean Hopping  Vascular surgery, Dr. Darrick Penna  Recommendations for Outpatient Follow-up:  1. patient had new onset atrial fibrillation this admission, CHADS 1, placed on aspirin 325 mg daily. He did undergo excision of right brachial cephalic AV fistula and diatek cath placement, anticoagulation with Coumadin or newer agents could have caused bleeding risk. Please follow or refer to cardiology outpatient.  2.Chest x-ray done 8/27 showed prominence of right paratracheal soft tissue, possibly vascular congestion, underlying LAD cannot be ruled out. Recommended contrast-enhanced chest CT to exclude lymphadenopathy or mediastinal mass if clinically indicated. No prior CT chest. Previous chest x-rays upto 2010 had shown similar prominent right paratracheal soft tissue, so this was not pursued inpatient. Patient had no respiratory symptoms., please follow outpatient.  3. patient is placed on vancomycin with hemodialysis for 2 weeks, please follow cultures.  4. Vascular surgery to consider new access at followup appointment     05/17/2013 1st  Pulmonary office visit/ Trevor [name] cc onset of violent cough specks of blood starting in [facility] July 2014 then later temp 103 attributed to the R  fistula removed and never treated for cough per se but rx with abx and improved by day of ov, cough tends to be worse lying down, freq awakening at hs, but only once or twice coughed up more than a speck or two despite on asa daily  Cough so hard almost vomited and passed out x one. Some sneezing, no epistaxis.  No obvious day to day or daytime variabilty or assoc   cp or chest tightness, subjective wheeze overt sinus or hb symptoms. No unusual exp hx or h/o childhood pna/ asthma or knowledge of premature birth.    Also denies any obvious fluctuation of symptoms with weather or environmental changes or other aggravating or alleviating factors except as outlined above   Current Medications, Allergies, Complete Past Medical History, Past Surgical History, Family History, and Social History were reviewed in [employer] record.       Review of Systems  Constitutional: Negative for fever, chills, activity change, appetite change and unexpected weight change.  HENT: Negative for congestion, sore throat, rhinorrhea, sneezing, trouble swallowing, dental problem, voice change and postnasal drip.   Eyes: Negative for visual disturbance.  Respiratory: Positive for cough. Negative for choking and shortness of breath.   Cardiovascular: Negative for chest pain and leg swelling.  Gastrointestinal: Negative for nausea, vomiting and abdominal pain.  Genitourinary: Negative for difficulty urinating.  Musculoskeletal: Negative for arthralgias.  Skin: Negative for rash.  Psychiatric/Behavioral: Negative for behavioral problems and confusion.       Objective:   Physical Exam amb bm nad  Wt Readings from Last 3 Encounters:  05/17/13 231 lb 3.2 oz (  104.872 kg)  05/07/13 230 lb 2.6 oz (104.4 kg)  05/07/13 230 lb 2.6 oz (104.4 kg)    HEENT: nl dentition, turbinates, and orophanx. Nl external ear canals without cough reflex   NECK :  without JVD/Nodes/TM/ nl carotid upstrokes  bilaterally   LUNGS: no acc muscle use, clear to A and P bilaterally without cough on insp or exp maneuvers but trace exp rhonchi.   CV:  RRR  no s3 or murmur or increase in P2, no edema   ABD:  soft and nontender with nl excursion in the supine position. No bruits or organomegaly, bowel sounds nl  MS:  warm without deformities, calf tenderness, cyanosis or clubbing  SKIN: warm and dry without lesions    NEURO:  alert, approp, no deficits  CXR  05/17/2013 :  declined           Assessment & Plan:

## 2013-05-17 NOTE — Patient Instructions (Addendum)
Hold aspirin whenever actively bleeding  Stop labetolol and replace with Bystolic 10 mg twice daily   Pepcid ac (over the counter) 20 mg one at bedtime until return  GERD (REFLUX)  is an extremely common cause of respiratory symptoms, many times with no significant heartburn at all.    It can be treated with medication, but also with lifestyle changes including avoidance of late meals, excessive alcohol, smoking cessation, and avoid fatty foods, chocolate, peppermint, colas, red wine, and acidic juices such as orange juice.  NO MINT OR MENTHOL PRODUCTS SO NO COUGH DROPS  USE SUGARLESS CANDY INSTEAD (jolley ranchers or Stover's)  NO OIL BASED VITAMINS - use powdered substitutes.  Please schedule a follow up office visit in 2  weeks, sooner if needed with cxr on return

## 2013-05-17 NOTE — Assessment & Plan Note (Signed)
DDX of  difficult airways managment all start with A and  include Adherence, Ace Inhibitors, Acid Reflux, Active Sinus Disease, Alpha 1 Antitripsin deficiency, Anxiety masquerading as Airways dz,  ABPA,  allergy(esp in young), Aspiration (esp in elderly), Adverse effects of DPI,  Active smokers, plus two Bs  = Bronchiectasis and Beta blocker use..and one C= CHF   ? Acid reflux > try diet/ hs pepcid 20 mg at bedtime  ? BB effect > try bystolic (see hbp)  ? Bronchiectasis > may need CT chest at some point but not interested in cxr today as feels he's getting better

## 2013-05-18 ENCOUNTER — Encounter: Payer: Self-pay | Admitting: Vascular Surgery

## 2013-05-18 DIAGNOSIS — R509 Fever, unspecified: Secondary | ICD-10-CM | POA: Diagnosis not present

## 2013-05-18 DIAGNOSIS — N186 End stage renal disease: Secondary | ICD-10-CM | POA: Diagnosis not present

## 2013-05-18 DIAGNOSIS — D631 Anemia in chronic kidney disease: Secondary | ICD-10-CM | POA: Diagnosis not present

## 2013-05-18 DIAGNOSIS — T82598A Other mechanical complication of other cardiac and vascular devices and implants, initial encounter: Secondary | ICD-10-CM | POA: Diagnosis not present

## 2013-05-18 DIAGNOSIS — N2581 Secondary hyperparathyroidism of renal origin: Secondary | ICD-10-CM | POA: Diagnosis not present

## 2013-05-19 ENCOUNTER — Encounter: Payer: Self-pay | Admitting: Vascular Surgery

## 2013-05-19 ENCOUNTER — Ambulatory Visit (INDEPENDENT_AMBULATORY_CARE_PROVIDER_SITE_OTHER): Payer: Self-pay | Admitting: Vascular Surgery

## 2013-05-19 VITALS — BP 156/96 | HR 57 | Temp 98.4°F | Ht 73.5 in | Wt 231.0 lb

## 2013-05-19 DIAGNOSIS — N186 End stage renal disease: Secondary | ICD-10-CM

## 2013-05-19 NOTE — Progress Notes (Signed)
Patient is a 62 year old male who returns for postoperative followup today. He had excision of the proximal half of his right brachiocephalic AV fistula for presumed infection on August 27. He currently is dialyzing via a right side catheter. He also had aneurysmal degeneration of the fistula. He states that he is still occasionally having fevers. He states that he is on antibiotics with dialysis. No specific organism grew out of his cultures.  Physical exam:  Filed Vitals:   05/19/13 0837  BP: 156/96  Pulse: 57  Temp: 98.4 F (36.9 C)  TempSrc: Oral  Height: 6' 1.5" (1.867 m)  Weight: 231 lb (104.781 kg)  SpO2: 100%    Right upper extremity: Healing antecubital and upper arm incision. No erythema no drainage 2+ brachial and radial pulses bilaterally  Assessment: Improving status post removal of aneurysmal fistula the patient currently still on antibiotics with occasional fever of unknown origin. He will need a new access at some point. However I would prefer not to do this while he is having fevers oral antibiotics.  Plan: Central venogram by my partner Dr. Imogene Burn on September 18 for planning purposes. He will followup with me after the central venogram. Consideration will be given at that time for left arm AV graft versus basilic vein transposition in the left  Fabienne Bruns, MD [facility] [place] Office: [phone] Pager: [phone]

## 2013-05-20 ENCOUNTER — Other Ambulatory Visit: Payer: Self-pay

## 2013-05-20 DIAGNOSIS — R509 Fever, unspecified: Secondary | ICD-10-CM | POA: Diagnosis not present

## 2013-05-20 DIAGNOSIS — N2581 Secondary hyperparathyroidism of renal origin: Secondary | ICD-10-CM | POA: Diagnosis not present

## 2013-05-20 DIAGNOSIS — T82598A Other mechanical complication of other cardiac and vascular devices and implants, initial encounter: Secondary | ICD-10-CM | POA: Diagnosis not present

## 2013-05-20 DIAGNOSIS — N186 End stage renal disease: Secondary | ICD-10-CM | POA: Diagnosis not present

## 2013-05-20 DIAGNOSIS — D631 Anemia in chronic kidney disease: Secondary | ICD-10-CM | POA: Diagnosis not present

## 2013-05-23 DIAGNOSIS — N2581 Secondary hyperparathyroidism of renal origin: Secondary | ICD-10-CM | POA: Diagnosis not present

## 2013-05-23 DIAGNOSIS — R509 Fever, unspecified: Secondary | ICD-10-CM | POA: Diagnosis not present

## 2013-05-23 DIAGNOSIS — T82598A Other mechanical complication of other cardiac and vascular devices and implants, initial encounter: Secondary | ICD-10-CM | POA: Diagnosis not present

## 2013-05-23 DIAGNOSIS — D631 Anemia in chronic kidney disease: Secondary | ICD-10-CM | POA: Diagnosis not present

## 2013-05-23 DIAGNOSIS — N186 End stage renal disease: Secondary | ICD-10-CM | POA: Diagnosis not present

## 2013-05-24 ENCOUNTER — Encounter (HOSPITAL_COMMUNITY): Payer: Self-pay | Admitting: Pharmacy Technician

## 2013-05-25 DIAGNOSIS — N186 End stage renal disease: Secondary | ICD-10-CM | POA: Diagnosis not present

## 2013-05-25 DIAGNOSIS — N2581 Secondary hyperparathyroidism of renal origin: Secondary | ICD-10-CM | POA: Diagnosis not present

## 2013-05-25 DIAGNOSIS — R509 Fever, unspecified: Secondary | ICD-10-CM | POA: Diagnosis not present

## 2013-05-25 DIAGNOSIS — T82598A Other mechanical complication of other cardiac and vascular devices and implants, initial encounter: Secondary | ICD-10-CM | POA: Diagnosis not present

## 2013-05-25 DIAGNOSIS — D631 Anemia in chronic kidney disease: Secondary | ICD-10-CM | POA: Diagnosis not present

## 2013-05-26 ENCOUNTER — Ambulatory Visit (HOSPITAL_COMMUNITY)
Admission: RE | Admit: 2013-05-26 | Discharge: 2013-05-26 | Disposition: A | Discharge status: Home / Self Care | Payer: Medicare Other | Source: Ambulatory Visit | Attending: Vascular Surgery | Admitting: Vascular Surgery

## 2013-05-26 ENCOUNTER — Encounter (HOSPITAL_COMMUNITY)
Admission: RE | Disposition: A | Discharge status: Home / Self Care | Payer: Self-pay | Source: Ambulatory Visit | Attending: Vascular Surgery

## 2013-05-26 DIAGNOSIS — I868 Varicose veins of other specified sites: Secondary | ICD-10-CM | POA: Insufficient documentation

## 2013-05-26 DIAGNOSIS — I120 Hypertensive chronic kidney disease with stage 5 chronic kidney disease or end stage renal disease: Secondary | ICD-10-CM | POA: Insufficient documentation

## 2013-05-26 DIAGNOSIS — N2581 Secondary hyperparathyroidism of renal origin: Secondary | ICD-10-CM | POA: Diagnosis not present

## 2013-05-26 DIAGNOSIS — I509 Heart failure, unspecified: Secondary | ICD-10-CM | POA: Diagnosis not present

## 2013-05-26 DIAGNOSIS — R0602 Shortness of breath: Secondary | ICD-10-CM | POA: Insufficient documentation

## 2013-05-26 DIAGNOSIS — N186 End stage renal disease: Secondary | ICD-10-CM | POA: Insufficient documentation

## 2013-05-26 HISTORY — PX: VENOGRAM: SHX5497

## 2013-05-26 LAB — POCT I-STAT, CHEM 8
Chloride: 98 mEq/L (ref 96–112)
Glucose, Bld: 83 mg/dL (ref 70–99)
HCT: 34 % — ABNORMAL LOW (ref 39.0–52.0)
Hemoglobin: 11.6 g/dL — ABNORMAL LOW (ref 13.0–17.0)
Potassium: 4.3 mEq/L (ref 3.5–5.1)
Sodium: 141 mEq/L (ref 135–145)

## 2013-05-26 SURGERY — VENOGRAM
Anesthesia: LOCAL | Laterality: Left

## 2013-05-26 MED ORDER — HYDRALAZINE HCL 20 MG/ML IJ SOLN: 10.0000 mg | INTRAMUSCULAR | Status: DC | PRN

## 2013-05-26 MED ORDER — ACETAMINOPHEN 325 MG PO TABS: 650.0000 mg | ORAL_TABLET | ORAL | Status: DC | PRN

## 2013-05-26 MED ORDER — HEPARIN SODIUM (PORCINE) 1000 UNIT/ML IJ SOLN
INTRAMUSCULAR | Status: AC
  Filled 2013-05-26: qty 1

## 2013-05-26 MED ORDER — SODIUM CHLORIDE 0.9 % IJ SOLN: 3.0000 mL | INTRAMUSCULAR | Status: DC | PRN

## 2013-05-26 MED ORDER — ONDANSETRON HCL 4 MG/2ML IJ SOLN: 4.0000 mg | Freq: Four times a day (QID) | INTRAMUSCULAR | Status: DC | PRN

## 2013-05-26 NOTE — H&P (Signed)
[facility] [place]  Brief History and Physical  History of Present Illness  Trevor [name] is a 62 y.o. male who presents with chief complaint: previous infected fistula.  The patient presents today for L central and arm venogram.    Past Medical History  Diagnosis Date  . Anemia   . AVF (arteriovenous fistula)     Left  . Secondary hyperparathyroidism   . Hypovitaminosis D   . Hypertensive urgency     H/o  . CHF (congestive heart failure)   . Exertional shortness of breath     "related to infection in my lungs right now" (05/03/2013)  . History of gout     "before I started doing the dialysis" (05/03/2013)  . ESRD (end stage renal disease) on dialysis     "[facility]; MWF" (05/03/2013)    Past Surgical History  Procedure Laterality Date  . Av fistula placement Left     Dr. Charlean Sanfilippo; "I've had 2 on the left' (05/03/2013)  . Av fistula placement Right ~ 2011  . Knee arthroscopy Left   . Avgg removal Right 05/04/2013    Procedure: REMOVAL OF ARTERIOVENOUS Fistula Right Arm;  Surgeon: [name], MD;  Location: [facility] OR;  Service: Vascular;  Laterality: Right;  . Insertion of dialysis catheter Right 05/04/2013    Procedure: INSERTION OF DIALYSIS CATHETER;  Surgeon: [name], MD;  Location: [facility] OR;  Service: Vascular;  Laterality: Right;    History   Social History  . Marital Status: Single    Spouse Name: N/A    Number of Children: N/A  . Years of Education: N/A   Occupational History  . Not on file.   Social History Main Topics  . Smoking status: Former Smoker -- 0.25 packs/day for .5 years    Types: Cigarettes    Quit date: 09/08/1972  . Smokeless tobacco: Never Used  . Alcohol Use: Yes     Comment: 05/03/2013 "haven't had a glass of wine in ~ 3 months or so; sometimes will have one w/dinner"  . Drug Use: No  . Sexual Activity: Yes   Other Topics Concern  . Not on file   Social History Narrative   Former smoker- quit over 30  yrs ago   Patient is on disability    Family History  Problem Relation Age of Onset  . Hypertension Father   . Kidney disease Father   . Allergies Father   . Deep vein thrombosis Sister   . Pulmonary embolism Sister     No current facility-administered medications on file prior to encounter.   Current Outpatient Prescriptions on File Prior to Encounter  Medication Sig Dispense Refill  . amLODipine (NORVASC) 10 MG tablet Take 10 mg by mouth daily.        . Arginine 1000 MG TABS Take 2,000 mg by mouth daily.      Marland Kitchen oxyCODONE (OXY IR/ROXICODONE) 5 MG immediate release tablet Take 1-2 tablets (5-10 mg total) by mouth every 6 (six) hours as needed for pain.  30 tablet  0  . sevelamer carbonate (RENVELA) 800 MG tablet Take 4,000 mg by mouth 3 (three) times daily with meals.        No Known Allergies  Review of Systems: As listed above, otherwise negative.  Physical Examination  Filed Vitals:   05/26/13 0611  BP: 151/102  Pulse: 44  Temp: 98.6 F (37 C)  TempSrc: Oral  Resp: 18  Height: 6' 1.5" (  1.867 m)  Weight: 231 lb (104.781 kg)  SpO2: 93%    General: A&O x 3, WDWN  Pulmonary: Sym exp, good air movt, CTAB, no rales, rhonchi, & wheezing  Cardiac: RRR, Nl S1, S2, no Murmurs, rubs or gallops  Gastrointestinal: soft, NTND, -G/R, - HSM, - masses, - CVAT B  Musculoskeletal: M/S 5/5 throughout , Extremities without ischemic changes , R arm incision healed  Laboratory See iStat  Medical Decision Making  Trevor [name] is a 62 y.o. male who presents with: ESRD.   The patient is scheduled for: L arm and central venogram I discussed with the patient the nature of angiographic procedures, especially the limited patencies of any endovascular intervention.  The patient is aware of that the risks of an angiographic procedure include but are not limited to: bleeding, infection, access site complications, renal failure, embolization, rupture of vessel, dissection, possible  need for emergent surgical intervention, possible need for surgical procedures to treat the patient's pathology, and stroke and death.    The patient is aware of the risks and agrees to proceed.  Leonides Sake, MD [facility] [place] Office: [phone] Pager: [phone]  05/26/2013, 7:35 AM

## 2013-05-26 NOTE — Op Note (Signed)
OPERATIVE NOTE   PROCEDURE: 1. left arm and central venogram   PRE-OPERATIVE DIAGNOSIS: end stage renal disease  POST-OPERATIVE DIAGNOSIS: same as above   SURGEON: Leonides Sake, MD  ANESTHESIA: local  ESTIMATED BLOOD LOSS: 5 cc  FINDING(S): 1. Patent left basilic vein 2. Aneurysmal axillary/subclavian vein 3. Some delayed contrast drainage through left innominate vein but no obvious side branch rediversion and contrast drains into the superior vena cava   SPECIMEN(S):  None  CONTRAST: 36 cc  INDICATIONS: Trevor [name] is a 62 y.o. male who  presents with end stage renal disease.  The patient is scheduled for left arm and central venogram to help determine the availability of proximal veins for permanent access placement.  The patient is aware the risks include but are not limited to: bleeding, infection, thrombosis of the cannulated access, and possible anaphylactic reaction to the contrast.  The patient is aware of the risks of the procedure and elects to proceed forward.  DESCRIPTION: After full informed written consent was obtained, the patient was brought back to the angiography suite and placed supine upon the angiography table.  The patient was connected to monitoring equipment.  The left forearm IV was connected to IV extension tubing.  Hand injections were completed to image the arm veins and central venous structures, the findings of which are listed above.  Dr. Darrick Penna will review the venogram and plan the next access procedure.  COMPLICATIONS: none  CONDITION: stable  Leonides Sake, MD [facility] [place] Office: [phone] Pager: [phone]  05/26/2013 7:48 AM

## 2013-05-27 DIAGNOSIS — R509 Fever, unspecified: Secondary | ICD-10-CM | POA: Diagnosis not present

## 2013-05-27 DIAGNOSIS — D631 Anemia in chronic kidney disease: Secondary | ICD-10-CM | POA: Diagnosis not present

## 2013-05-27 DIAGNOSIS — T82598A Other mechanical complication of other cardiac and vascular devices and implants, initial encounter: Secondary | ICD-10-CM | POA: Diagnosis not present

## 2013-05-27 DIAGNOSIS — N2581 Secondary hyperparathyroidism of renal origin: Secondary | ICD-10-CM | POA: Diagnosis not present

## 2013-05-27 DIAGNOSIS — N186 End stage renal disease: Secondary | ICD-10-CM | POA: Diagnosis not present

## 2013-05-30 DIAGNOSIS — D631 Anemia in chronic kidney disease: Secondary | ICD-10-CM | POA: Diagnosis not present

## 2013-05-30 DIAGNOSIS — T82598A Other mechanical complication of other cardiac and vascular devices and implants, initial encounter: Secondary | ICD-10-CM | POA: Diagnosis not present

## 2013-05-30 DIAGNOSIS — N186 End stage renal disease: Secondary | ICD-10-CM | POA: Diagnosis not present

## 2013-05-30 DIAGNOSIS — N2581 Secondary hyperparathyroidism of renal origin: Secondary | ICD-10-CM | POA: Diagnosis not present

## 2013-05-30 DIAGNOSIS — R509 Fever, unspecified: Secondary | ICD-10-CM | POA: Diagnosis not present

## 2013-06-01 ENCOUNTER — Encounter: Payer: Self-pay | Admitting: Vascular Surgery

## 2013-06-02 ENCOUNTER — Encounter: Payer: Medicare Other | Admitting: Vascular Surgery

## 2013-06-02 ENCOUNTER — Ambulatory Visit: Payer: Medicare Other | Admitting: Internal Medicine

## 2013-06-02 DIAGNOSIS — N186 End stage renal disease: Secondary | ICD-10-CM | POA: Diagnosis not present

## 2013-06-02 DIAGNOSIS — D631 Anemia in chronic kidney disease: Secondary | ICD-10-CM | POA: Diagnosis not present

## 2013-06-02 DIAGNOSIS — N2581 Secondary hyperparathyroidism of renal origin: Secondary | ICD-10-CM | POA: Diagnosis not present

## 2013-06-04 DIAGNOSIS — N186 End stage renal disease: Secondary | ICD-10-CM | POA: Diagnosis not present

## 2013-06-04 DIAGNOSIS — N2581 Secondary hyperparathyroidism of renal origin: Secondary | ICD-10-CM | POA: Diagnosis not present

## 2013-06-04 DIAGNOSIS — D631 Anemia in chronic kidney disease: Secondary | ICD-10-CM | POA: Diagnosis not present

## 2013-06-07 DIAGNOSIS — D631 Anemia in chronic kidney disease: Secondary | ICD-10-CM | POA: Diagnosis not present

## 2013-06-07 DIAGNOSIS — N186 End stage renal disease: Secondary | ICD-10-CM | POA: Diagnosis not present

## 2013-06-07 DIAGNOSIS — N2581 Secondary hyperparathyroidism of renal origin: Secondary | ICD-10-CM | POA: Diagnosis not present

## 2013-06-08 ENCOUNTER — Ambulatory Visit (INDEPENDENT_AMBULATORY_CARE_PROVIDER_SITE_OTHER): Payer: Medicare Other | Admitting: Internal Medicine

## 2013-06-08 ENCOUNTER — Ambulatory Visit (INDEPENDENT_AMBULATORY_CARE_PROVIDER_SITE_OTHER)
Admission: RE | Admit: 2013-06-08 | Discharge: 2013-06-08 | Disposition: A | Discharge status: Home / Self Care | Payer: Medicare Other | Source: Ambulatory Visit | Attending: Internal Medicine | Admitting: Internal Medicine

## 2013-06-08 ENCOUNTER — Encounter: Payer: Self-pay | Admitting: Internal Medicine

## 2013-06-08 ENCOUNTER — Encounter: Payer: Self-pay | Admitting: Vascular Surgery

## 2013-06-08 VITALS — BP 132/80 | HR 87 | Temp 98.1°F | Ht 73.5 in | Wt 226.8 lb

## 2013-06-08 DIAGNOSIS — R05 Cough: Secondary | ICD-10-CM

## 2013-06-08 DIAGNOSIS — I509 Heart failure, unspecified: Secondary | ICD-10-CM | POA: Diagnosis not present

## 2013-06-08 DIAGNOSIS — R042 Hemoptysis: Secondary | ICD-10-CM

## 2013-06-08 DIAGNOSIS — R059 Cough, unspecified: Secondary | ICD-10-CM

## 2013-06-08 DIAGNOSIS — J811 Chronic pulmonary edema: Secondary | ICD-10-CM | POA: Diagnosis not present

## 2013-06-08 MED ORDER — FAMOTIDINE 20 MG PO TABS: ORAL_TABLET | ORAL | Status: DC

## 2013-06-08 MED ORDER — PREDNISONE (PAK) 10 MG PO TABS: ORAL_TABLET | ORAL | Status: DC

## 2013-06-08 MED ORDER — TRAMADOL HCL 50 MG PO TABS: ORAL_TABLET | ORAL | Status: DC

## 2013-06-08 NOTE — Assessment & Plan Note (Addendum)
-   trial off labetalol 05/17/2013  > no apparent change  Given lack of findings on cxr or on exam strongly suspect some form of  Classic Upper airway cough syndrome, so named because it's frequently impossible to sort out how much is  CR/sinusitis with freq throat clearing (which can be related to primary GERD)   vs  causing  secondary (" extra esophageal")  GERD from wide swings in gastric pressure that occur with throat clearing, often  promoting self use of mint and menthol lozenges that reduce the lower esophageal sphincter tone and exacerbate the problem further in a cyclical fashion.   These are the same pts (now being labeled as having "irritable larynx syndrome" by some cough centers) who not infrequently have a history of having failed to tolerate ace inhibitors,  dry powder inhalers or biphosphonates or report having atypical reflux symptoms that don't respond to standard doses of PPI , and are easily confused as having aecopd or asthma flares by even experienced allergists/ pulmonologists.  Will try max non-ppi gerd rx with diet and h2 plus h1 at hs and see if can eliminate cyclical coughing then regroup with all meds in hand.

## 2013-06-08 NOTE — Patient Instructions (Addendum)
Pepcid 20mg  after breakfast  and Pepcid 20 mg one bedtime and chlorpheniramine 4 mg until return to office - this is the best way to tell whether stomach acid is contributing to your problem.    Prednisone 10 mg take  4 each am x 2 days,   2 each am x 2 days,  1 each am x 2 days and stop   Take delsym two tsp every 12 hours and supplement if needed with tramadol 50 mg one every 4 hours  to suppress the urge to cough. Swallowing water or using ice chips/non mint and menthol containing candies (such as lifesavers or sugarless jolly ranchers) are also effective.  You should rest your voice and avoid activities that you know make you cough.  Once you have eliminated the cough for 3 straight days try reducing the tramadol first,  then the delsym as tolerated.    Please schedule a follow up office visit in 2 weeks, sooner if needed with all medications in hand including over the counter.

## 2013-06-08 NOTE — Progress Notes (Signed)
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 Dx with sarcoid in [place]  in the 1980s but never on prednisone on HD x 2011 referred by Dr Julaine Fusi 05/17/2013 for eval of severe cough to point of passing out > admit:  Admit date: 05/02/2013  Discharge date: 05/07/2013  Primary Care Physician: Irena Cords, MD  Discharge Diagnoses:   . HYPERTENSION . Atrial fibrillation- new onset  . Fever, unspecified, likely due to infection of AVF  Status post Excision right Brachial Cephalic AV Fistula  Diatek cath placement  End-stage renal disease on hemodialysis MWF  Anemia of chronic disease  Consults: Nephrology, Dr. Arlean Hopping  Vascular surgery, Dr. Darrick Penna  Recommendations for Outpatient Follow-up:  1. patient had new onset atrial fibrillation this admission, CHADS 1, placed on aspirin 325 mg daily. He did undergo excision of right brachial cephalic AV fistula and diatek cath placement, anticoagulation with Coumadin or newer agents could have caused bleeding risk. Please follow or refer to cardiology outpatient.  2.Chest x-ray done 8/27 showed prominence of right paratracheal soft tissue, possibly vascular congestion, underlying LAD cannot be ruled out. Recommended contrast-enhanced chest CT to exclude lymphadenopathy or mediastinal mass if clinically indicated. No prior CT chest. Previous chest x-rays upto 2010 had shown similar prominent right paratracheal soft tissue, so this was not pursued inpatient. Patient had no respiratory symptoms., please follow outpatient.  3. patient is placed on vancomycin with hemodialysis for 2 weeks, please follow cultures.  4. Vascular surgery to consider new access at followup appointment     05/17/2013 1st [facility] office visit/ Trevor [name] cc onset of violent cough specks of blood starting in [facility] July 2014 then later temp  103 attributed to the R fistula removed and never treated for cough per se but rx with abx and improved by day of ov, cough tends to be worse lying down, freq awakening at hs, but only once or twice coughed up more than a speck or two despite on asa daily  Cough so hard almost vomited and passed out x one. Some sneezing, no epistaxis. hoarseness came on p ET for fistula  rec Hold aspirin whenever actively bleeding Stop labetolol and replace with Bystolic 10 mg twice daily  Pepcid ac (over the counter) 20 mg one at bedtime until return GERD diet    06/08/2013 f/u ov/Trevor [name] re: cough Chief Complaint  Patient presents with  . Follow-up    Pt states still having hemoptysis- occurs on a daily basis. He is drinking water with baking soda and states that this helps some. He was having some SOB when he had dialysis yesterday, and was placed on supplemental o2 and this helped.    very confused with meds, cough is 24 h per day with minimal streaky hemoptysis No change sob on or off bystolic vs labetolol    No obvious day to day or daytime variabilty or assoc   or cp or chest tightness, subjective wheeze overt sinus or hb symptoms. No unusual exp hx or h/o childhood pna/ asthma or knowledge of premature birth.  Sleeping ok without nocturnal  or early am exacerbation  of respiratory  c/o's or need for noct saba. Also denies any obvious fluctuation of symptoms with weather or environmental changes or other aggravating or alleviating factors except as outlined above   Current Medications,  Allergies, Complete Past Medical History, Past Surgical History, Family History, and Social History were reviewed in [employer] record.  ROS  The following are not active complaints unless bolded sore throat, dysphagia, dental problems, itching, sneezing,  nasal congestion or excess/ purulent secretions, ear ache,   fever, chills, sweats, unintended wt loss, pleuritic or exertional cp, hemoptysis,   orthopnea pnd or leg swelling, presyncope, palpitations, heartburn, abdominal bloating on ? prilosec, anorexia, nausea, vomiting, diarrhea  or change in bowel or urinary habits, change in stools or urine, dysuria,hematuria,  rash, arthralgias, visual complaints, headache, numbness weakness or ataxia or problems with walking or coordination,  change in mood/affect or memory.                  Objective:   Physical Exam   amb bm nad mod hoarse   06/08/2013       226  Wt Readings from Last 3 Encounters:  05/17/13 231 lb 3.2 oz (104.872 kg)  05/07/13 230 lb 2.6 oz (104.4 kg)  05/07/13 230 lb 2.6 oz (104.4 kg)    HEENT: nl dentition, turbinates, and orophanx. Nl external ear canals without cough reflex   NECK :  without JVD/Nodes/TM/ nl carotid upstrokes bilaterally   LUNGS: no acc muscle use, clear to A and P bilaterally without cough on insp or exp maneuvers      CV:  RRR  no s3 or murmur or increase in P2, no edema   ABD:  soft and nontender with nl excursion in the supine position. No bruits or organomegaly, bowel sounds nl  MS:  warm without deformities, calf tenderness, cyanosis or clubbing  SKIN: warm and dry without lesions       CXR  06/08/2013 : Findings consistent with congestive heart failure and edema which has improved since prior study. Minimal pleural effusion.            Assessment & Plan:

## 2013-06-08 NOTE — Assessment & Plan Note (Signed)
The total amt of blood is minimal and there is no obvious source on cxr so will follow conservatively

## 2013-06-09 ENCOUNTER — Encounter: Payer: Self-pay | Admitting: Vascular Surgery

## 2013-06-09 ENCOUNTER — Ambulatory Visit (INDEPENDENT_AMBULATORY_CARE_PROVIDER_SITE_OTHER): Payer: Self-pay | Admitting: Vascular Surgery

## 2013-06-09 VITALS — BP 160/102 | HR 82 | Ht 73.5 in | Wt 228.2 lb

## 2013-06-09 DIAGNOSIS — N2581 Secondary hyperparathyroidism of renal origin: Secondary | ICD-10-CM | POA: Diagnosis not present

## 2013-06-09 DIAGNOSIS — N186 End stage renal disease: Secondary | ICD-10-CM

## 2013-06-09 DIAGNOSIS — D631 Anemia in chronic kidney disease: Secondary | ICD-10-CM | POA: Diagnosis not present

## 2013-06-09 DIAGNOSIS — D509 Iron deficiency anemia, unspecified: Secondary | ICD-10-CM | POA: Diagnosis not present

## 2013-06-09 NOTE — Progress Notes (Signed)
Quick Note:  Advised pt of cxr results per MW. Pt verbalized understanding and has no further concerns or questions at this time  ______ 

## 2013-06-09 NOTE — Progress Notes (Signed)
Patient is a 62 year old male who recently had ligation and removal of a right upper arm fistula for infection. He currently is dialyzing via a catheter. He returns today for review of his recent central venogram as well as planning for a new hemodialysis access. He has previously had a left brachiocephalic and radiocephalic fistula both of which have failed.  Physical exam: Filed Vitals:   06/09/13 0854  BP: 160/102  Pulse: 82  Height: 6' 1.5" (1.867 m)  Weight: 228 lb 3.2 oz (103.511 kg)  SpO2: 98%    Left upper extremity: 2+ brachial and radial pulse with visible basilic vein along the ulnar aspect of the forearm  Right upper extremity: 2+ brachial and radial pulse with healing incisions right upper extremity  Assessment: Needs new hemodialysis access  Plan: Left basilic vein transposition fistula 06/27/2013. Risks benefits possible complications procedure details discussed with the patient today. He understands and agrees to proceed.  Fabienne Bruns, MD [facility] [place] Office: [phone] Pager: [phone]

## 2013-06-13 ENCOUNTER — Encounter: Payer: Self-pay | Admitting: Gastroenterology

## 2013-06-14 ENCOUNTER — Other Ambulatory Visit: Payer: Self-pay

## 2013-06-22 ENCOUNTER — Encounter (HOSPITAL_COMMUNITY): Payer: Self-pay | Admitting: Pharmacy Technician

## 2013-06-22 ENCOUNTER — Ambulatory Visit: Payer: Medicare Other | Admitting: Internal Medicine

## 2013-06-23 ENCOUNTER — Encounter (HOSPITAL_COMMUNITY): Payer: Self-pay | Admitting: *Deleted

## 2013-06-23 NOTE — Progress Notes (Signed)
Anesthesia Chart Review: Patient is a 62 year old male scheduled for left basilic vein transposition on 06/27/13 by Dr. Darrick Penna.  He is scheduled to be a same day work-up.  History includes former smoker, ESRD on HD MWF, CHF, new onset afib 04/2013, anemia, OSA, HTN, secondary hyperparathyroidism, anemia, GERD.    In August 2014, he was admitted for syncope and was found to be in atrial fibrillation in the setting of RUE AVF infection with bleeding. He was seen by cardiologist Dr. Armanda Magic who recommended Coumadin for 4 weeks and then attempt at DCCV followed by another 4 weeks of anticoagulation therapy.  However, it appears it was ultimately decided to place on ASA since his CHADS 1 and warfarin or newer agents could increase bleeding risk--as he had also had some issues with hemoptysis with severe coughing.  [facility] reported that he was never seen in their office following his discharge.  He has since undergone excision of right brachiocephalic AVF and insertion of Diatek catheter on 05/04/13.  He has been see by pulmonologist Dr. Sherene Sires for follow-up hemoptysis and severe coughing causing syncope or near-syncope. Hemoptysis was minimal and there was no obvious source, so Dr. Sherene Sires is following for now. He is trying to optimize GERD treatment to see if coughing improves.  Echo on 04/29/13 showed: - Left ventricle: The cavity size was mildly to moderately dilated. Wall thickness was increased in a pattern of mild LVH. Systolic function was normal. The estimated ejection fraction was in the range of 50% to 55%. Wall motion was normal; there were no regional wall motion abnormalities. - Aortic valve: Sclerosis without stenosis. No significant regurgitation. - Aorta: Aortic root dimension: 33mm (ED). - Left atrium: The atrium was moderately dilated. - Right ventricle: The cavity size was mildly dilated. Systolic function was mildly reduced. - Right atrium: The atrium was mildly dilated. - Pulmonary  arteries: PA peak pressure: 49mm Hg (S). - Pericardium, extracardiac: A trivial pericardial effusion was identified posterior to the heart.  EKG on 04/27/13 showed atrial flutter, right axis deviation. Anterolateral T wave abnormality.  CXR on 06/08/13 showed cardiac enlargement with vascular congestion. Improvement in pulmonary edema since the prior study. Minimal pleural effusion. PermCath tips in the right atrium unchanged.  Our PAT nurse called patient for his pre-operative interview.  He reports that he does not currently have a PCP.  He is followed by his nephrologist Dr. Primitivo Gauze.  He continues to have issues with coughing, but no chest pain or significant SOB.  He is unsure if he is still in afib.  He said that he refused cardioversion and anticoagulation therapy.   I reviewed above with anesthesiologist Dr. Katrinka Blazing.  Patient has known afib with refusal of Coumadin and cardioversion. He has been rate controlled in the past, and tolerated vascular access procedure during his hospitalization in 04/2013.  I will order a repeat EKG to get a baseline rhythm.  He will also get an [place].  If both are stable and he has no acute cardiopulmonary issues, then it is anticipated that he can proceed.  He will be further evaluated by his assigned anesthesiologist on the day of surgery.  Velna Ochs [facility] Short Stay Center/Anesthesiology Phone [phone] 06/24/2013 12:00 PM

## 2013-06-26 MED ORDER — DEXTROSE 5 % IV SOLN
1.5000 g | INTRAVENOUS | Status: AC
  Administered 2013-06-27: 1.5 g via INTRAVENOUS
  Filled 2013-06-26: qty 1.5

## 2013-06-27 ENCOUNTER — Encounter (HOSPITAL_COMMUNITY)
Admission: RE | Disposition: A | Discharge status: Home / Self Care | Payer: Self-pay | Source: Ambulatory Visit | Attending: Vascular Surgery

## 2013-06-27 ENCOUNTER — Ambulatory Visit (HOSPITAL_COMMUNITY): Payer: Medicare Other | Admitting: Vascular Surgery

## 2013-06-27 ENCOUNTER — Encounter (HOSPITAL_COMMUNITY): Payer: Medicare Other | Admitting: Vascular Surgery

## 2013-06-27 ENCOUNTER — Ambulatory Visit (HOSPITAL_COMMUNITY)
Admission: RE | Admit: 2013-06-27 | Discharge: 2013-06-27 | Disposition: A | Discharge status: Home / Self Care | Payer: Medicare Other | Source: Ambulatory Visit | Attending: Vascular Surgery | Admitting: Vascular Surgery

## 2013-06-27 DIAGNOSIS — Z992 Dependence on renal dialysis: Secondary | ICD-10-CM | POA: Diagnosis not present

## 2013-06-27 DIAGNOSIS — R404 Transient alteration of awareness: Secondary | ICD-10-CM | POA: Diagnosis not present

## 2013-06-27 DIAGNOSIS — I120 Hypertensive chronic kidney disease with stage 5 chronic kidney disease or end stage renal disease: Secondary | ICD-10-CM | POA: Insufficient documentation

## 2013-06-27 DIAGNOSIS — D696 Thrombocytopenia, unspecified: Secondary | ICD-10-CM | POA: Diagnosis not present

## 2013-06-27 DIAGNOSIS — Z79899 Other long term (current) drug therapy: Secondary | ICD-10-CM | POA: Insufficient documentation

## 2013-06-27 DIAGNOSIS — I82409 Acute embolism and thrombosis of unspecified deep veins of unspecified lower extremity: Secondary | ICD-10-CM | POA: Diagnosis not present

## 2013-06-27 DIAGNOSIS — M109 Gout, unspecified: Secondary | ICD-10-CM | POA: Diagnosis present

## 2013-06-27 DIAGNOSIS — Z87891 Personal history of nicotine dependence: Secondary | ICD-10-CM | POA: Diagnosis not present

## 2013-06-27 DIAGNOSIS — M899 Disorder of bone, unspecified: Secondary | ICD-10-CM | POA: Diagnosis present

## 2013-06-27 DIAGNOSIS — I4891 Unspecified atrial fibrillation: Secondary | ICD-10-CM | POA: Insufficient documentation

## 2013-06-27 DIAGNOSIS — Z841 Family history of disorders of kidney and ureter: Secondary | ICD-10-CM | POA: Diagnosis not present

## 2013-06-27 DIAGNOSIS — N186 End stage renal disease: Secondary | ICD-10-CM

## 2013-06-27 DIAGNOSIS — D649 Anemia, unspecified: Secondary | ICD-10-CM | POA: Diagnosis not present

## 2013-06-27 DIAGNOSIS — Z8249 Family history of ischemic heart disease and other diseases of the circulatory system: Secondary | ICD-10-CM | POA: Diagnosis not present

## 2013-06-27 DIAGNOSIS — I5022 Chronic systolic (congestive) heart failure: Secondary | ICD-10-CM | POA: Diagnosis present

## 2013-06-27 DIAGNOSIS — J90 Pleural effusion, not elsewhere classified: Secondary | ICD-10-CM | POA: Diagnosis not present

## 2013-06-27 DIAGNOSIS — M79609 Pain in unspecified limb: Secondary | ICD-10-CM | POA: Diagnosis not present

## 2013-06-27 DIAGNOSIS — R9431 Abnormal electrocardiogram [ECG] [EKG]: Secondary | ICD-10-CM | POA: Diagnosis present

## 2013-06-27 DIAGNOSIS — D631 Anemia in chronic kidney disease: Secondary | ICD-10-CM | POA: Diagnosis present

## 2013-06-27 DIAGNOSIS — Z7901 Long term (current) use of anticoagulants: Secondary | ICD-10-CM | POA: Diagnosis not present

## 2013-06-27 DIAGNOSIS — T82898A Other specified complication of vascular prosthetic devices, implants and grafts, initial encounter: Secondary | ICD-10-CM | POA: Diagnosis present

## 2013-06-27 DIAGNOSIS — R0602 Shortness of breath: Secondary | ICD-10-CM | POA: Diagnosis not present

## 2013-06-27 DIAGNOSIS — G4733 Obstructive sleep apnea (adult) (pediatric): Secondary | ICD-10-CM | POA: Diagnosis present

## 2013-06-27 DIAGNOSIS — I10 Essential (primary) hypertension: Secondary | ICD-10-CM | POA: Diagnosis not present

## 2013-06-27 DIAGNOSIS — E559 Vitamin D deficiency, unspecified: Secondary | ICD-10-CM | POA: Diagnosis present

## 2013-06-27 DIAGNOSIS — I2699 Other pulmonary embolism without acute cor pulmonale: Secondary | ICD-10-CM | POA: Diagnosis not present

## 2013-06-27 DIAGNOSIS — I824Z9 Acute embolism and thrombosis of unspecified deep veins of unspecified distal lower extremity: Secondary | ICD-10-CM | POA: Diagnosis present

## 2013-06-27 DIAGNOSIS — K219 Gastro-esophageal reflux disease without esophagitis: Secondary | ICD-10-CM | POA: Diagnosis present

## 2013-06-27 DIAGNOSIS — I4892 Unspecified atrial flutter: Secondary | ICD-10-CM | POA: Diagnosis present

## 2013-06-27 DIAGNOSIS — I509 Heart failure, unspecified: Secondary | ICD-10-CM | POA: Diagnosis present

## 2013-06-27 DIAGNOSIS — N039 Chronic nephritic syndrome with unspecified morphologic changes: Secondary | ICD-10-CM | POA: Diagnosis not present

## 2013-06-27 DIAGNOSIS — N2581 Secondary hyperparathyroidism of renal origin: Secondary | ICD-10-CM | POA: Diagnosis present

## 2013-06-27 DIAGNOSIS — R55 Syncope and collapse: Secondary | ICD-10-CM | POA: Diagnosis not present

## 2013-06-27 HISTORY — PX: BASCILIC VEIN TRANSPOSITION: SHX5742

## 2013-06-27 HISTORY — DX: Sleep apnea, unspecified: G47.30

## 2013-06-27 HISTORY — DX: Gastro-esophageal reflux disease without esophagitis: K21.9

## 2013-06-27 HISTORY — DX: Cardiac arrhythmia, unspecified: I49.9

## 2013-06-27 LAB — POCT I-STAT 4, (NA,K, GLUC, HGB,HCT)
HCT: 32 % — ABNORMAL LOW (ref 39.0–52.0)
Potassium: 3.7 mEq/L (ref 3.5–5.1)
Sodium: 138 mEq/L (ref 135–145)

## 2013-06-27 SURGERY — TRANSPOSITION, VEIN, BASILIC
Anesthesia: General | Site: Arm Upper | Laterality: Left | Wound class: Clean

## 2013-06-27 MED ORDER — PROTAMINE SULFATE 10 MG/ML IV SOLN
INTRAVENOUS | Status: DC | PRN
  Administered 2013-06-27: 40 mg via INTRAVENOUS
  Administered 2013-06-27: 10 mg via INTRAVENOUS

## 2013-06-27 MED ORDER — ATROPINE SULFATE 0.4 MG/ML IJ SOLN
INTRAMUSCULAR | Status: DC | PRN
  Administered 2013-06-27: 0.4 mg via INTRAVENOUS

## 2013-06-27 MED ORDER — MIDAZOLAM HCL 5 MG/5ML IJ SOLN
INTRAMUSCULAR | Status: DC | PRN
  Administered 2013-06-27: 2 mg via INTRAVENOUS

## 2013-06-27 MED ORDER — FENTANYL CITRATE 0.05 MG/ML IJ SOLN
INTRAMUSCULAR | Status: DC | PRN
  Administered 2013-06-27: 100 ug via INTRAVENOUS
  Administered 2013-06-27: 25 ug via INTRAVENOUS
  Administered 2013-06-27: 50 ug via INTRAVENOUS
  Administered 2013-06-27 (×3): 25 ug via INTRAVENOUS

## 2013-06-27 MED ORDER — THROMBIN 20000 UNITS EX SOLR
CUTANEOUS | Status: AC
  Filled 2013-06-27: qty 20000

## 2013-06-27 MED ORDER — HYDROMORPHONE HCL PF 1 MG/ML IJ SOLN
INTRAMUSCULAR | Status: AC
  Filled 2013-06-27: qty 1

## 2013-06-27 MED ORDER — PROPOFOL 10 MG/ML IV BOLUS
INTRAVENOUS | Status: DC | PRN
  Administered 2013-06-27: 200 mg via INTRAVENOUS

## 2013-06-27 MED ORDER — 0.9 % SODIUM CHLORIDE (POUR BTL) OPTIME
TOPICAL | Status: DC | PRN
  Administered 2013-06-27: 1000 mL

## 2013-06-27 MED ORDER — LIDOCAINE HCL (CARDIAC) 20 MG/ML IV SOLN
INTRAVENOUS | Status: DC | PRN
  Administered 2013-06-27: 100 mg via INTRAVENOUS

## 2013-06-27 MED ORDER — EPHEDRINE SULFATE 50 MG/ML IJ SOLN
INTRAMUSCULAR | Status: DC | PRN
  Administered 2013-06-27: 10 mg via INTRAVENOUS
  Administered 2013-06-27: 15 mg via INTRAVENOUS

## 2013-06-27 MED ORDER — HYDROMORPHONE HCL PF 1 MG/ML IJ SOLN
0.2500 mg | INTRAMUSCULAR | Status: DC | PRN
  Administered 2013-06-27: 0.5 mg via INTRAVENOUS

## 2013-06-27 MED ORDER — ALBUMIN HUMAN 5 % IV SOLN
INTRAVENOUS | Status: DC | PRN
  Administered 2013-06-27: 10:00:00 via INTRAVENOUS

## 2013-06-27 MED ORDER — SUCCINYLCHOLINE CHLORIDE 20 MG/ML IJ SOLN
INTRAMUSCULAR | Status: DC | PRN
  Administered 2013-06-27: 100 mg via INTRAVENOUS

## 2013-06-27 MED ORDER — LIDOCAINE HCL (PF) 1 % IJ SOLN
INTRAMUSCULAR | Status: AC
  Filled 2013-06-27: qty 30

## 2013-06-27 MED ORDER — SODIUM CHLORIDE 0.9 % IV SOLN
INTRAVENOUS | Status: DC
  Administered 2013-06-27 (×3): via INTRAVENOUS

## 2013-06-27 MED ORDER — OXYCODONE-ACETAMINOPHEN 5-325 MG PO TABS
ORAL_TABLET | ORAL | Status: AC
  Administered 2013-06-27: 1
  Filled 2013-06-27: qty 1

## 2013-06-27 MED ORDER — OXYCODONE HCL 5 MG PO TABS: 5.0000 mg | ORAL_TABLET | Freq: Four times a day (QID) | ORAL | Status: DC | PRN

## 2013-06-27 MED ORDER — HEPARIN SODIUM (PORCINE) 1000 UNIT/ML IJ SOLN
INTRAMUSCULAR | Status: DC | PRN
  Administered 2013-06-27: 5000 [IU] via INTRAVENOUS

## 2013-06-27 MED ORDER — SODIUM CHLORIDE 0.9 % IR SOLN
Status: DC | PRN
  Administered 2013-06-27: 08:00:00

## 2013-06-27 MED ORDER — OXYCODONE-ACETAMINOPHEN 5-325 MG PO TABS
ORAL_TABLET | ORAL | Status: AC
  Administered 2013-06-27: 0.5
  Filled 2013-06-27: qty 1

## 2013-06-27 MED ORDER — ONDANSETRON HCL 4 MG/2ML IJ SOLN
INTRAMUSCULAR | Status: DC | PRN
  Administered 2013-06-27: 4 mg via INTRAMUSCULAR

## 2013-06-27 MED ORDER — PHENYLEPHRINE HCL 10 MG/ML IJ SOLN
INTRAMUSCULAR | Status: DC | PRN
  Administered 2013-06-27: 120 ug via INTRAVENOUS
  Administered 2013-06-27: 40 ug via INTRAVENOUS
  Administered 2013-06-27: 80 ug via INTRAVENOUS
  Administered 2013-06-27: 40 ug via INTRAVENOUS
  Administered 2013-06-27: 80 ug via INTRAVENOUS

## 2013-06-27 SURGICAL SUPPLY — 43 items
BLADE SURG ROTATE 9660 (MISCELLANEOUS) ×2 IMPLANT
CANISTER SUCTION 2500CC (MISCELLANEOUS) ×2 IMPLANT
CLIP TI MEDIUM 24 (CLIP) IMPLANT
CLIP TI MEDIUM 6 (CLIP) ×2 IMPLANT
CLIP TI WIDE RED SMALL 24 (CLIP) IMPLANT
CLIP TI WIDE RED SMALL 6 (CLIP) ×2 IMPLANT
COVER PROBE W GEL 5X96 (DRAPES) ×2 IMPLANT
COVER SURGICAL LIGHT HANDLE (MISCELLANEOUS) ×2 IMPLANT
DECANTER SPIKE VIAL GLASS SM (MISCELLANEOUS) IMPLANT
DERMABOND ADVANCED (GAUZE/BANDAGES/DRESSINGS) ×3
DERMABOND ADVANCED .7 DNX12 (GAUZE/BANDAGES/DRESSINGS) ×3 IMPLANT
ELECT REM PT RETURN 9FT ADLT (ELECTROSURGICAL) ×2
ELECTRODE REM PT RTRN 9FT ADLT (ELECTROSURGICAL) ×1 IMPLANT
GEL ULTRASOUND 20GR AQUASONIC (MISCELLANEOUS) IMPLANT
GLOVE BIO SURGEON STRL SZ 6.5 (GLOVE) ×8 IMPLANT
GLOVE BIO SURGEON STRL SZ7 (GLOVE) ×2 IMPLANT
GLOVE BIO SURGEON STRL SZ7.5 (GLOVE) ×2 IMPLANT
GLOVE BIOGEL PI IND STRL 6.5 (GLOVE) ×1 IMPLANT
GLOVE BIOGEL PI INDICATOR 6.5 (GLOVE) ×1
GLOVE SS N UNI LF 7.0 STRL (GLOVE) ×2 IMPLANT
GOWN PREVENTION PLUS XLARGE (GOWN DISPOSABLE) ×4 IMPLANT
GOWN STRL NON-REIN LRG LVL3 (GOWN DISPOSABLE) ×6 IMPLANT
KIT BASIN OR (CUSTOM PROCEDURE TRAY) ×2 IMPLANT
KIT ROOM TURNOVER OR (KITS) ×2 IMPLANT
LOOP VESSEL MINI RED (MISCELLANEOUS) IMPLANT
NS IRRIG 1000ML POUR BTL (IV SOLUTION) ×2 IMPLANT
PACK CV ACCESS (CUSTOM PROCEDURE TRAY) ×2 IMPLANT
PAD ARMBOARD 7.5X6 YLW CONV (MISCELLANEOUS) ×4 IMPLANT
SPONGE SURGIFOAM ABS GEL 100 (HEMOSTASIS) IMPLANT
SUT PROLENE 6 0 CC (SUTURE) ×2 IMPLANT
SUT SILK 2 0 SH (SUTURE) ×2 IMPLANT
SUT SILK 3 0 (SUTURE) ×2
SUT SILK 3 0 TIES 17X18 (SUTURE) ×1
SUT SILK 3-0 18XBRD TIE 12 (SUTURE) ×2 IMPLANT
SUT SILK 3-0 18XBRD TIE BLK (SUTURE) ×1 IMPLANT
SUT VIC AB 3-0 SH 27 (SUTURE) ×3
SUT VIC AB 3-0 SH 27X BRD (SUTURE) ×3 IMPLANT
SUT VICRYL 4-0 PS2 18IN ABS (SUTURE) ×8 IMPLANT
TAPE UMBILICAL COTTON 1/8X30 (MISCELLANEOUS) ×2 IMPLANT
TOWEL OR 17X24 6PK STRL BLUE (TOWEL DISPOSABLE) ×2 IMPLANT
TOWEL OR 17X26 10 PK STRL BLUE (TOWEL DISPOSABLE) ×2 IMPLANT
UNDERPAD 30X30 INCONTINENT (UNDERPADS AND DIAPERS) ×2 IMPLANT
WATER STERILE IRR 1000ML POUR (IV SOLUTION) ×2 IMPLANT

## 2013-06-27 NOTE — Interval H&P Note (Signed)
History and Physical Interval Note:  06/27/2013 7:31 AM  Trevor [name]  has presented today for surgery, with the diagnosis of End Stage Renal Disease  The various methods of treatment have been discussed with the patient and family. After consideration of risks, benefits and other options for treatment, the patient has consented to  Procedure(s): BASCILIC VEIN TRANSPOSITION-LEFT (Left) as a surgical intervention .  The patient's history has been reviewed, patient examined, no change in status, stable for surgery.  I have reviewed the patient's chart and labs.  Questions were answered to the patient's satisfaction.     Terri Malerba E

## 2013-06-27 NOTE — Anesthesia Procedure Notes (Addendum)
Procedure Name: LMA Insertion Date/Time: 06/27/2013 7:43 AM Performed by: Carmela Rima Pre-anesthesia Checklist: Patient identified, Emergency Drugs available, Timeout performed, Suction available and Patient being monitored Patient Re-evaluated:Patient Re-evaluated prior to inductionOxygen Delivery Method: Circle system utilized Preoxygenation: Pre-oxygenation with 100% oxygen Intubation Type: IV induction Ventilation: Mask ventilation without difficulty LMA Size: 5.0 Number of attempts: 2 Placement Confirmation: positive ETCO2 and breath sounds checked- equal and bilateral Tube secured with: Tape Dental Injury: Teeth and Oropharynx as per pre-operative assessment    Procedure Name: Intubation Date/Time: 06/27/2013 8:21 AM Performed by: Carmela Rima Pre-anesthesia Checklist: Patient identified, Timeout performed, Emergency Drugs available, Suction available and Patient being monitored Oxygen Delivery Method: Circle system utilized Intubation Type: IV induction Ventilation: Mask ventilation without difficulty Laryngoscope Size: Mac and 4 Grade View: Grade I Tube type: Oral Tube size: 7.5 mm Number of attempts: 1 Placement Confirmation: positive ETCO2,  ETT inserted through vocal cords under direct vision and breath sounds checked- equal and bilateral Secured at: 23 cm Tube secured with: Tape Dental Injury: Teeth and Oropharynx as per pre-operative assessment

## 2013-06-27 NOTE — Anesthesia Postprocedure Evaluation (Signed)
  Anesthesia Post-op Note  Patient: Trevor [name]  Procedure(s) Performed: Procedure(s): BASCILIC VEIN TRANSPOSITION (Left)  Patient Location: PACU  Anesthesia Type:General  Level of Consciousness: awake   Airway and Oxygen Therapy: Patient Spontanous Breathing  Post-op Pain: mild  Post-op Assessment: Post-op Vital signs reviewed  Post-op Vital Signs: Reviewed  Complications: No apparent anesthesia complications

## 2013-06-27 NOTE — Anesthesia Preprocedure Evaluation (Addendum)
Anesthesia Evaluation  Patient identified by MRN, date of birth, ID band Patient awake    Reviewed: Allergy & Precautions, H&P , NPO status , Patient's Chart, lab work & pertinent test results  Airway Mallampati: II TM Distance: >3 FB Neck ROM: full    Dental  (+) Poor Dentition and Dental Advidsory Given   Pulmonary shortness of breath and at rest, sleep apnea ,          Cardiovascular hypertension, On Medications +CHF + dysrhythmias Atrial Fibrillation     Neuro/Psych  Headaches,  Neuromuscular disease    GI/Hepatic GERD-  Medicated and Poorly Controlled,  Endo/Other    Renal/GU ESRFRenal disease     Musculoskeletal   Abdominal   Peds  Hematology  (+) anemia ,   Anesthesia Other Findings   Reproductive/Obstetrics                           Anesthesia Physical Anesthesia Plan  ASA: III  Anesthesia Plan: General   Post-op Pain Management:    Induction: Intravenous  Airway Management Planned: LMA  Additional Equipment:   Intra-op Plan:   Post-operative Plan:   Informed Consent: I have reviewed the patients History and Physical, chart, labs and discussed the procedure including the risks, benefits and alternatives for the proposed anesthesia with the patient or authorized representative who has indicated his/her understanding and acceptance.   Dental Advisory Given  Plan Discussed with: Anesthesiologist and CRNA  Anesthesia Plan Comments:       Anesthesia Quick Evaluation

## 2013-06-27 NOTE — Progress Notes (Signed)
At 1345. Patient is awake ,alert,oriented x4. Transferred from stretcher to chair.  Patient started coughing and had an episode of unresponsiveness  of about 10 sec duration. At this time he is out of bed to chair alert oriented x 4. Seen by Dr. Randa Evens. Per patient he had this episode in the past and it started when the right IJ dialysis catheter was placed.

## 2013-06-27 NOTE — Op Note (Signed)
Procedure: Left basilic vein transposition fistula  Preoperative diagnosis: End-stage renal disease  Postoperative diagnosis: Same  Anesthesia: Gen.  Assistant: Doreatha Massed PA-C  Operative findings: 3-5 mm left basilic vein, 3 mm left brachial artery  Operative details: After obtaining informed consent, the patient was taken to the operating room. The patient was placed in supine position operating table. After induction of general anesthesia, the patient's entire left upper extremity was prepped and draped in the usual sterile fashion. Next ultrasound was used to identify the left basilic vein. A longitudinal incision was made just above the antecubital crease in order to expose the basilic vein. The vein was of good quality approximately 3-5 mm in diameter throughout its course. Several longitudinal skip incisions were made up the arm from just below the antecubital crease all the way up to the axilla to harvest the basilic vein. Care was taken to try to not injure any sensory nerves and all the motor nerves were identified and protected. The vein was dissected free circumferentially and small side branches ligated and divided between silk ties or clips. Next the brachial artery was exposed by deepening the basilic vein harvest incision just above the antecubital crease. The artery was approximately 3 mm in diameter. There were dense adhesions medially from prior operation in this area.  This was dissected free circumferentially. Vessel loops were placed around it.  Next the distal basilic vein was ligated with a 2 silk tie and the vein transected. The vein was brought out throughout the skip incisions and gently distended with heparinized saline and marked for orientation. The vein was then tunneled subcutaneously in an arcing configuration out over the biceps muscle down to the level of the exposed brachial artery just above the antecubital crease. The patient was given 5000 units of intravenous  heparin. Vessel loops were used to control the artery proximally and distally. The vein was cut to length and sewn end of vein to side of artery using a running 6-0 Prolene suture. Just prior to completion of the anastomosis, it was forebled backbled and thoroughly flushed. The anastomosis was secured Vesseloops were released.    There was a palpable thrill immediately. There was also good Doppler flow throughout the course of the fistula. The distal vein was also noted to fill fully. The patient had a palpable radial pulse.  At this point all subcutaneous tissues were reapproximated using running 3-0 Vicryl suture. All skin incisions were closed with running 4  0 Vicryl subcuticular stitch. Dermabond was applied to all incisions. The patient tolerated the procedure well and there were no complications. Instrument sponge needle counts were correct at the end of the case. The patient was taken to the recovery room in stable condition.  Fabienne Bruns, MD [facility] Office: [phone] Pager: [phone]

## 2013-06-27 NOTE — H&P (View-Only) (Signed)
Patient is a 62-year-old male who recently had ligation and removal of a right upper arm fistula for infection. He currently is dialyzing via a catheter. He returns today for review of his recent central venogram as well as planning for a new hemodialysis access. He has previously had a left brachiocephalic and radiocephalic fistula both of which have failed.  Physical exam: Filed Vitals:   06/09/13 0854  BP: 160/102  Pulse: 82  Height: 6' 1.5" (1.867 m)  Weight: 228 lb 3.2 oz (103.511 kg)  SpO2: 98%    Left upper extremity: 2+ brachial and radial pulse with visible basilic vein along the ulnar aspect of the forearm  Right upper extremity: 2+ brachial and radial pulse with healing incisions right upper extremity  Assessment: Needs new hemodialysis access  Plan: Left basilic vein transposition fistula 06/27/2013. Risks benefits possible complications procedure details discussed with the patient today. He understands and agrees to proceed.  Ajane Novella, MD [facility] Office: [phone] Pager: [phone]  

## 2013-06-27 NOTE — Transfer of Care (Signed)
Immediate Anesthesia Transfer of Care Note  Patient: Trevor [name]  Procedure(s) Performed: Procedure(s): BASCILIC VEIN TRANSPOSITION (Left)  Patient Location: PACU  Anesthesia Type:General  Level of Consciousness: sedated  Airway & Oxygen Therapy: Patient Spontanous Breathing and Patient connected to face mask oxygen  Post-op Assessment: Report given to PACU RN, Post -op Vital signs reviewed and stable and Patient moving all extremities X 4  Post vital signs: Reviewed and stable  Complications: No apparent anesthesia complications

## 2013-06-28 ENCOUNTER — Telehealth: Payer: Self-pay | Admitting: Vascular Surgery

## 2013-06-28 ENCOUNTER — Emergency Department (HOSPITAL_COMMUNITY): Payer: Medicare Other

## 2013-06-28 ENCOUNTER — Other Ambulatory Visit: Payer: Self-pay | Admitting: *Deleted

## 2013-06-28 ENCOUNTER — Inpatient Hospital Stay (HOSPITAL_COMMUNITY)
Admission: EM | Admit: 2013-06-28 | Discharge: 2013-07-04 | DRG: 308 | Disposition: A | Discharge status: Home / Self Care | Payer: Medicare Other | Attending: Internal Medicine | Admitting: Internal Medicine

## 2013-06-28 ENCOUNTER — Encounter (HOSPITAL_COMMUNITY): Payer: Self-pay | Admitting: Emergency Medicine

## 2013-06-28 DIAGNOSIS — Z8249 Family history of ischemic heart disease and other diseases of the circulatory system: Secondary | ICD-10-CM

## 2013-06-28 DIAGNOSIS — I824Z9 Acute embolism and thrombosis of unspecified deep veins of unspecified distal lower extremity: Secondary | ICD-10-CM | POA: Diagnosis present

## 2013-06-28 DIAGNOSIS — D631 Anemia in chronic kidney disease: Secondary | ICD-10-CM | POA: Diagnosis not present

## 2013-06-28 DIAGNOSIS — I120 Hypertensive chronic kidney disease with stage 5 chronic kidney disease or end stage renal disease: Secondary | ICD-10-CM | POA: Diagnosis present

## 2013-06-28 DIAGNOSIS — Z79899 Other long term (current) drug therapy: Secondary | ICD-10-CM

## 2013-06-28 DIAGNOSIS — Z992 Dependence on renal dialysis: Secondary | ICD-10-CM | POA: Diagnosis present

## 2013-06-28 DIAGNOSIS — M899 Disorder of bone, unspecified: Secondary | ICD-10-CM | POA: Diagnosis present

## 2013-06-28 DIAGNOSIS — I5022 Chronic systolic (congestive) heart failure: Secondary | ICD-10-CM | POA: Diagnosis present

## 2013-06-28 DIAGNOSIS — I509 Heart failure, unspecified: Secondary | ICD-10-CM | POA: Diagnosis present

## 2013-06-28 DIAGNOSIS — M109 Gout, unspecified: Secondary | ICD-10-CM | POA: Diagnosis present

## 2013-06-28 DIAGNOSIS — K219 Gastro-esophageal reflux disease without esophagitis: Secondary | ICD-10-CM | POA: Insufficient documentation

## 2013-06-28 DIAGNOSIS — I4892 Unspecified atrial flutter: Secondary | ICD-10-CM | POA: Diagnosis present

## 2013-06-28 DIAGNOSIS — N186 End stage renal disease: Secondary | ICD-10-CM | POA: Diagnosis present

## 2013-06-28 DIAGNOSIS — Z4931 Encounter for adequacy testing for hemodialysis: Secondary | ICD-10-CM

## 2013-06-28 DIAGNOSIS — Z841 Family history of disorders of kidney and ureter: Secondary | ICD-10-CM

## 2013-06-28 DIAGNOSIS — I2699 Other pulmonary embolism without acute cor pulmonale: Secondary | ICD-10-CM | POA: Diagnosis not present

## 2013-06-28 DIAGNOSIS — I82401 Acute embolism and thrombosis of unspecified deep veins of right lower extremity: Secondary | ICD-10-CM

## 2013-06-28 DIAGNOSIS — I82409 Acute embolism and thrombosis of unspecified deep veins of unspecified lower extremity: Secondary | ICD-10-CM

## 2013-06-28 DIAGNOSIS — E559 Vitamin D deficiency, unspecified: Secondary | ICD-10-CM | POA: Diagnosis present

## 2013-06-28 DIAGNOSIS — R9431 Abnormal electrocardiogram [ECG] [EKG]: Secondary | ICD-10-CM | POA: Diagnosis present

## 2013-06-28 DIAGNOSIS — I10 Essential (primary) hypertension: Secondary | ICD-10-CM | POA: Diagnosis present

## 2013-06-28 DIAGNOSIS — T82898A Other specified complication of vascular prosthetic devices, implants and grafts, initial encounter: Secondary | ICD-10-CM | POA: Diagnosis present

## 2013-06-28 DIAGNOSIS — R404 Transient alteration of awareness: Secondary | ICD-10-CM | POA: Diagnosis not present

## 2013-06-28 DIAGNOSIS — I4891 Unspecified atrial fibrillation: Principal | ICD-10-CM | POA: Diagnosis present

## 2013-06-28 DIAGNOSIS — Z7901 Long term (current) use of anticoagulants: Secondary | ICD-10-CM

## 2013-06-28 DIAGNOSIS — R55 Syncope and collapse: Secondary | ICD-10-CM | POA: Diagnosis not present

## 2013-06-28 DIAGNOSIS — D649 Anemia, unspecified: Secondary | ICD-10-CM

## 2013-06-28 DIAGNOSIS — G4733 Obstructive sleep apnea (adult) (pediatric): Secondary | ICD-10-CM | POA: Diagnosis present

## 2013-06-28 DIAGNOSIS — N2581 Secondary hyperparathyroidism of renal origin: Secondary | ICD-10-CM | POA: Diagnosis present

## 2013-06-28 DIAGNOSIS — Z87891 Personal history of nicotine dependence: Secondary | ICD-10-CM

## 2013-06-28 DIAGNOSIS — Y832 Surgical operation with anastomosis, bypass or graft as the cause of abnormal reaction of the patient, or of later complication, without mention of misadventure at the time of the procedure: Secondary | ICD-10-CM | POA: Diagnosis present

## 2013-06-28 DIAGNOSIS — J90 Pleural effusion, not elsewhere classified: Secondary | ICD-10-CM | POA: Diagnosis not present

## 2013-06-28 DIAGNOSIS — Y92009 Unspecified place in unspecified non-institutional (private) residence as the place of occurrence of the external cause: Secondary | ICD-10-CM

## 2013-06-28 DIAGNOSIS — D696 Thrombocytopenia, unspecified: Secondary | ICD-10-CM | POA: Diagnosis not present

## 2013-06-28 LAB — CBC WITH DIFFERENTIAL/PLATELET
Basophils Relative: 0 % (ref 0–1)
Eosinophils Absolute: 0 10*3/uL (ref 0.0–0.7)
Eosinophils Relative: 0 % (ref 0–5)
Hemoglobin: 9.3 g/dL — ABNORMAL LOW (ref 13.0–17.0)
Lymphs Abs: 0.7 10*3/uL (ref 0.7–4.0)
MCH: 26.6 pg (ref 26.0–34.0)
MCHC: 32.7 g/dL (ref 30.0–36.0)
MCV: 81.1 fL (ref 78.0–100.0)
Monocytes Relative: 11 % (ref 3–12)
RBC: 3.5 MIL/uL — ABNORMAL LOW (ref 4.22–5.81)
WBC: 5.1 10*3/uL (ref 4.0–10.5)

## 2013-06-28 LAB — TROPONIN I: Troponin I: <0.3 ng/mL (ref <0.30)

## 2013-06-28 LAB — BASIC METABOLIC PANEL
BUN: 50 mg/dL — ABNORMAL HIGH (ref 6–23)
Calcium: 9.5 mg/dL (ref 8.4–10.5)
Creatinine, Ser: 10.53 mg/dL — ABNORMAL HIGH (ref 0.50–1.35)
GFR calc non Af Amer: 5 mL/min — ABNORMAL LOW (ref >90)
Glucose, Bld: 119 mg/dL — ABNORMAL HIGH (ref 70–99)

## 2013-06-28 MED ORDER — HEPARIN BOLUS VIA INFUSION
4000.0000 [IU] | Freq: Once | INTRAVENOUS | Status: AC
  Administered 2013-06-28: 4000 [IU] via INTRAVENOUS
  Filled 2013-06-28: qty 4000

## 2013-06-28 MED ORDER — ONDANSETRON HCL 4 MG/2ML IJ SOLN: 4.0000 mg | Freq: Once | INTRAMUSCULAR | Status: DC

## 2013-06-28 MED ORDER — SEVELAMER CARBONATE 800 MG PO TABS
2400.0000 mg | ORAL_TABLET | Freq: Three times a day (TID) | ORAL | Status: DC
  Administered 2013-06-28 – 2013-07-04 (×13): 2400 mg via ORAL
  Filled 2013-06-28 (×21): qty 3

## 2013-06-28 MED ORDER — IOHEXOL 350 MG/ML SOLN
100.0000 mL | Freq: Once | INTRAVENOUS | Status: AC | PRN
  Administered 2013-06-28: 100 mL via INTRAVENOUS

## 2013-06-28 MED ORDER — SODIUM CHLORIDE 0.9 % IJ SOLN
3.0000 mL | Freq: Two times a day (BID) | INTRAMUSCULAR | Status: DC
  Administered 2013-06-28: 3 mL via INTRAVENOUS

## 2013-06-28 MED ORDER — DOXERCALCIFEROL 4 MCG/2ML IV SOLN
1.0000 ug | INTRAVENOUS | Status: DC
  Administered 2013-06-30 – 2013-07-02 (×2): 1 ug via INTRAVENOUS
  Filled 2013-06-28 (×3): qty 2

## 2013-06-28 MED ORDER — ONDANSETRON HCL 4 MG/2ML IJ SOLN: 4.0000 mg | Freq: Three times a day (TID) | INTRAMUSCULAR | Status: DC | PRN

## 2013-06-28 MED ORDER — HEPARIN SODIUM (PORCINE) 5000 UNIT/ML IJ SOLN: 5000.0000 [IU] | Freq: Three times a day (TID) | INTRAMUSCULAR | Status: DC

## 2013-06-28 MED ORDER — LABETALOL HCL 200 MG PO TABS
200.0000 mg | ORAL_TABLET | Freq: Two times a day (BID) | ORAL | Status: DC
  Administered 2013-06-28 – 2013-07-04 (×12): 200 mg via ORAL
  Filled 2013-06-28 (×14): qty 1

## 2013-06-28 MED ORDER — DARBEPOETIN ALFA-POLYSORBATE 40 MCG/0.4ML IJ SOLN
40.0000 ug | INTRAMUSCULAR | Status: DC
  Filled 2013-06-28: qty 0.4

## 2013-06-28 MED ORDER — DOXERCALCIFEROL 4 MCG/2ML IV SOLN
INTRAVENOUS | Status: AC
  Administered 2013-06-28: 1 ug
  Filled 2013-06-28: qty 2

## 2013-06-28 MED ORDER — ENSURE COMPLETE PO LIQD
237.0000 mL | ORAL | Status: AC
  Administered 2013-06-28: 237 mL via ORAL
  Filled 2013-06-28: qty 237

## 2013-06-28 MED ORDER — SODIUM CHLORIDE 0.9 % IV SOLN
250.0000 mL | INTRAVENOUS | Status: DC | PRN
  Administered 2013-06-28: 250 mL via INTRAVENOUS

## 2013-06-28 MED ORDER — DARBEPOETIN ALFA-POLYSORBATE 40 MCG/0.4ML IJ SOLN
INTRAMUSCULAR | Status: AC
  Administered 2013-06-28: 40 ug
  Filled 2013-06-28: qty 0.4

## 2013-06-28 MED ORDER — PANTOPRAZOLE SODIUM 40 MG PO TBEC
40.0000 mg | DELAYED_RELEASE_TABLET | Freq: Every day | ORAL | Status: DC
  Administered 2013-06-29 – 2013-07-02 (×4): 40 mg via ORAL
  Filled 2013-06-28 (×5): qty 1

## 2013-06-28 MED ORDER — SODIUM CHLORIDE 0.9 % IJ SOLN: 3.0000 mL | INTRAMUSCULAR | Status: DC | PRN

## 2013-06-28 MED ORDER — ASPIRIN EC 81 MG PO TBEC
81.0000 mg | DELAYED_RELEASE_TABLET | Freq: Every day | ORAL | Status: DC
  Administered 2013-06-28 – 2013-06-29 (×2): 81 mg via ORAL
  Filled 2013-06-28 (×2): qty 1

## 2013-06-28 MED ORDER — AMLODIPINE BESYLATE 10 MG PO TABS
10.0000 mg | ORAL_TABLET | Freq: Every day | ORAL | Status: DC
  Administered 2013-06-29 – 2013-07-04 (×6): 10 mg via ORAL
  Filled 2013-06-28 (×6): qty 1

## 2013-06-28 MED ORDER — HEPARIN (PORCINE) IN NACL 100-0.45 UNIT/ML-% IJ SOLN
2600.0000 [IU]/h | INTRAMUSCULAR | Status: DC
  Administered 2013-06-28: 1700 [IU]/h via INTRAVENOUS
  Administered 2013-06-29: 1900 [IU]/h via INTRAVENOUS
  Administered 2013-06-29 – 2013-06-30 (×3): 2250 [IU]/h via INTRAVENOUS
  Administered 2013-06-30 – 2013-07-03 (×7): 2350 [IU]/h via INTRAVENOUS
  Administered 2013-07-04: 2600 [IU]/h via INTRAVENOUS
  Filled 2013-06-28 (×15): qty 250

## 2013-06-28 NOTE — Telephone Encounter (Addendum)
Message copied by Fredrich Birks on Tue Jun 28, 2013 11:31 AM ------      Message from: Sharee Pimple      Created: Tue Jun 28, 2013  8:23 AM      Regarding: schedule                   ----- Message -----         From: Dara Lords, PA-C         Sent: 06/27/2013  11:18 AM           To: Sharee Pimple, CMA            S/p left BVT on 06/27/13.  F/u with Dr. Darrick Penna in 6 weeks.            Thanks,      Samantha ------  06/28/13: spoke with pts significant other to notify, dpm

## 2013-06-28 NOTE — ED Provider Notes (Signed)
CSN: [id number]     Arrival date & time 06/28/13  1051 History   First MD Initiated Contact with Patient 06/28/13 1054     Chief Complaint  Patient presents with  . Loss of Consciousness   (Consider location/radiation/quality/duration/timing/severity/associated sxs/prior Treatment) Patient is a 62 y.o. male presenting with syncope.  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 He has been getting dialysis through R subclavian catheter. He reports since he got home from the surgery yesterday he has had 4-5 syncopal episodes. He denies any antecedent CP or SOB. He has had syncopal episodes related to coughing spells in the past but no significant coughing today. He is scheduled for regular dialysis today but was not planning to go because he didn't feel well. Denies any fever, vomiting or diarrhea.   Past Medical History  Diagnosis Date  . Anemia   . AVF (arteriovenous fistula)     Left  . Secondary hyperparathyroidism   . Hypovitaminosis D   . Hypertensive urgency     H/o  . CHF (congestive heart failure)   . Exertional shortness of breath     "related to infection in my lungs right now" (05/03/2013)  . History of gout     "before I started doing the dialysis" (05/03/2013)  . ESRD (end stage renal disease) on dialysis     "[facility]; MWF" (05/03/2013)  . Dysrhythmia     Hx: aflutter  . Sleep apnea   . GERD (gastroesophageal reflux disease)   . [id number](478.2)    Past Surgical History  Procedure Laterality Date  . Av fistula placement Left     Dr. Charlean Sanfilippo; "I've had 2 on the left' (05/03/2013)  . Av fistula placement Right ~ 2011  . Knee arthroscopy Left   . Avgg removal Right 05/04/2013    Procedure: REMOVAL OF ARTERIOVENOUS Fistula Right Arm;  Surgeon: [name], MD;  Location: [facility] OR;  Service: Vascular;  Laterality: Right;  .  Insertion of dialysis catheter Right 05/04/2013    Procedure: INSERTION OF DIALYSIS CATHETER;  Surgeon: [name], MD;  Location: [facility] OR;  Service: Vascular;  Laterality: Right;  . Colonoscopy      Hx: of   Family History  Problem Relation Age of Onset  . Hypertension Father   . Kidney disease Father   . Allergies Father   . Deep vein thrombosis Sister   . Pulmonary embolism Sister    History  Substance Use Topics  . Smoking status: Former Smoker -- 0.25 packs/day for .5 years    Types: Cigarettes    Quit date: 09/08/1972  . Smokeless tobacco: Never Used  . Alcohol Use: Yes     Comment: 05/03/2013 "haven't had a glass of wine in ~ 3 months or so; sometimes will have one w/dinner"    Review of Systems  Cardiovascular: Positive for syncope.    Allergies  Review of patient's allergies indicates no known allergies.  Home Medications   Current Outpatient Rx  Name  Route  Sig  Dispense  Refill  . amLODipine (NORVASC) 10 MG tablet   Oral   Take 10 mg by mouth daily.           . Arginine 1000 MG TABS   Oral   Take 2,000 mg by mouth daily as needed (for vitamin).          Marland Kitchen  labetalol (NORMODYNE) 200 MG tablet   Oral   Take 200 mg by mouth 2 (two) times daily.         . sevelamer carbonate (RENVELA) 800 MG tablet   Oral   Take 3,200 mg by mouth as needed (for kidneys).           BP 111/86  Pulse 121 Physical Exam  Nursing note and vitals reviewed. Constitutional: He is oriented to person, place, and time. He appears well-developed and well-nourished.  HENT:  Head: Normocephalic and atraumatic.  Eyes: EOM are normal. Pupils are equal, round, and reactive to light.  Neck: Normal range of motion. Neck supple.  Cardiovascular: Normal heart sounds and intact distal pulses.  Tachycardia present.   Pulmonary/Chest: Effort normal and breath sounds normal. He has no wheezes. He has no rales.  Abdominal: Bowel sounds are normal. He exhibits no distension. There  is no tenderness.  Musculoskeletal: Normal range of motion. He exhibits no edema and no tenderness.  Palpable thrill in LUE fistula site from yesterday, healing surgical wounds, no dehiscence or drainage, distal pulses normal  Neurological: He is alert and oriented to person, place, and time. He has normal strength. No cranial nerve deficit or sensory deficit.  Skin: Skin is warm and dry. No rash noted.  Psychiatric: He has a normal mood and affect.    ED Course  Procedures (including critical care time) Labs Review Labs Reviewed  CBC WITH DIFFERENTIAL - Abnormal; Notable for the following:    RBC 3.50 (*)    Hemoglobin 9.3 (*)    HCT 28.4 (*)    RDW 15.9 (*)    Platelets 138 (*)    All other components within normal limits  BASIC METABOLIC PANEL - Abnormal; Notable for the following:    Sodium 134 (*)    Chloride 93 (*)    Glucose, Bld 119 (*)    BUN 50 (*)    Creatinine, Ser 10.53 (*)    GFR calc non Af Amer 5 (*)    GFR calc Af Amer 5 (*)    All other components within normal limits  TROPONIN I   Imaging Review Ct Angio Chest Pe W/cm &/or Wo Cm  06/28/2013   CLINICAL DATA:  Shortness of breath, chest pain, syncope.  EXAM: CT ANGIOGRAPHY CHEST WITH CONTRAST  TECHNIQUE: Multidetector CT imaging of the chest was performed using the standard protocol during bolus administration of intravenous contrast. Multiplanar CT image reconstructions including MIPs were obtained to evaluate the vascular anatomy.  CONTRAST:  OMNIPAQUE IOHEXOL 350 MG/ML SOLN  COMPARISON:  CHEST x-ray 06/08/2013.  FINDINGS: There are small apparent filling defects within a left lower lobe pulmonary artery near the left lung base supplying the posterior basal segment. There is respiratory motion on these images and is difficult to determine if this is artifactual or related to a very small pulmonary embolus. No definite other pulmonary emboli noted.  There is mediastinal adenopathy noted with partially  calcified mediastinal lymph nodes. Bilateral hilar calcified lymph nodes are also present. This is presumably related to old granulomatous disease. Heart is enlarged. Calcified granuloma in the right middle lobe of. Small right pleural effusion. Scattered coronary artery calcifications are present. Chest wall soft tissues are unremarkable. Imaging into the upper abdomen shows no acute findings.  No acute bony abnormality. Imaging into the upper abdomen shows no acute findings.  Review of the MIP images confirms the above findings.  IMPRESSION: Small filling defect in a lower  lobe pulmonary arterial branch. I favor this is related to respiratory motion as no other pulmonary emboli are seen, but it is difficult to completely exclude a very small pulmonary embolus.  Small right pleural effusion.  Calcified mediastinal and bilateral hilar adenopathy, presumably related to old granulomatous disease. Calcified granuloma in the right middle lobe.  Cardiomegaly.   Electronically Signed   By: Charlett Nose M.D.   On: 06/28/2013 12:18    EKG Interpretation     Ventricular Rate:  113 PR Interval:    QRS Duration: 104 QT Interval:  378 QTC Calculation: 530 R Axis:   85 Text Interpretation:  Atrial flutter Borderline right axis deviation Borderline low voltage, extremity leads Borderline repolarization abnormality Prolonged QT interval Since last tracing Rate faster , afib is flutter is old            MDM   1. Syncope   2. ESRD on dialysis     Labs and imaging reviewed. Unclear significance of CT findings. Pt feeling some better, plan admission for eval of syncope and for regularly scheduled dialysis for today.     Charles B. Bernette Mayers, MD 06/28/13 1553

## 2013-06-28 NOTE — ED Notes (Signed)
Admitting MD at bedside.

## 2013-06-28 NOTE — ED Notes (Signed)
Meal tray ordered 

## 2013-06-28 NOTE — H&P (Signed)
Date: 06/28/2013               Patient Name:  Trevor [name] MRN: [id number]  DOB: 10-Nov-1950 Age / Sex: 62 y.o., male   PCP: Irena Cords, MD         Medical Service: Internal Medicine Teaching Service         Attending Physician: Dr. Jonah Blue, DO    First Contact: Dr. Mikey Bussing Pager: [phone]  Second Contact: Dr. Virgina Organ Pager: [phone]       After Hours (After 5p/  First Contact Pager: [phone]  weekends / holidays): Second Contact Pager: [phone]   Chief Complaint: Syncopal episdoe  History of Present Illness: Trevor [name] is a 62 year old male with a PMH of ESRD on HD TTHS, A. Fib, HTN, Anemia, Secondary hyperparathyroidism, OSA, classic upper airway cough syndrome (Dx Dr Sherene Sires 06/08/13)  He presents to the [facility] today after 4 syncopal episodes today.  He reports that he passed out 4 times while walking today, he reports these were witnessed by his girlfriend Trevor [name].  He does report LOC but doesn't know for how long.  Ms Lennice Sites was not present on interview to coroborate.  He does not believe he had any shaking or tongue biting.  He does not know if he hit is head but he denies any head pain.  He denies any prodromal symptoms.  He denies loss of bowel or bladder.  He denies any dizziness on standing.  He does report confusion after the episodes.  He reports he does feel both of his legs were weak.  He denies any palpitations.  He does report SOB but this has been on going for the past 2 months and has been associated with a chronic cough for which he has been seen by Dr. Sherene Sires (Pulmonology).   In addition this is not his first syncopal episode.  He reports that he has had numerous issues over the past 2 months.  He was admitted to [facility] 04/27/13 after a syncopal episode which the patient reported happened during a coughing spell.  Workup in the hospital revealed newly diagnosed A fib which was thought to be a possible cause.  Cardiology was consulted and recommended coumadin for 4  weeks followed by cardioversion and coumadin for an additional 4 weeks, after that ASA 81mg  would be used for A/C since his CHADS2VASC score was 1.  Patient however choose to not take either.  He returned to the hospital on 05/02/13 with a fever, and also found to be in Afib with RVR.  He was found to have an infection of his right AVF and had to have it removed.  Cultures were negative and he was treated with Vancomycin for 2 weeks.   Meds: Current Facility-Administered Medications  Medication Dose Route Frequency Provider Last Rate Last Dose  . darbepoetin (ARANESP) injection 40 mcg  40 mcg Intravenous Q Tue-HD Kerin Salen, PA-C      . doxercalciferol ([name]) injection 1 mcg  1 mcg Intravenous Q T,Th,Sa-HD Kerin Salen, PA-C      . ondansetron [facility]) injection 4 mg  4 mg Intravenous Once Charles B. Bernette Mayers, MD       Current Outpatient Prescriptions  Medication Sig Dispense Refill  . amLODipine (NORVASC) 10 MG tablet Take 10 mg by mouth daily.        . Arginine 1000 MG TABS Take 2,000 mg by mouth daily as needed (for vitamin).       Marland Kitchen  labetalol (NORMODYNE) 200 MG tablet Take 200 mg by mouth 2 (two) times daily.      . sevelamer carbonate (RENVELA) 800 MG tablet Take 3,200 mg by mouth as needed (for kidneys).         Allergies: Allergies as of 06/28/2013  . (No Known Allergies)   Past Medical History  Diagnosis Date  . Anemia   . AVF (arteriovenous fistula)     Left  . Secondary hyperparathyroidism   . Hypovitaminosis D   . Hypertensive urgency     H/o  . CHF (congestive heart failure)   . Exertional shortness of breath     "related to infection in my lungs right now" (05/03/2013)  . History of gout     "before I started doing the dialysis" (05/03/2013)  . ESRD (end stage renal disease) on dialysis     "[facility]; MWF" (05/03/2013)  . Dysrhythmia     Hx: aflutter  . Sleep apnea   . GERD (gastroesophageal reflux disease)   . ZOXWRUEA(540.9)    Past Surgical  History  Procedure Laterality Date  . Av fistula placement Left     Dr. Charlean Sanfilippo; "I've had 2 on the left' (05/03/2013)  . Av fistula placement Right ~ 2011  . Knee arthroscopy Left   . Avgg removal Right 05/04/2013    Procedure: REMOVAL OF ARTERIOVENOUS Fistula Right Arm;  Surgeon: [name], MD;  Location: [facility] OR;  Service: Vascular;  Laterality: Right;  . Insertion of dialysis catheter Right 05/04/2013    Procedure: INSERTION OF DIALYSIS CATHETER;  Surgeon: [name], MD;  Location: [facility] OR;  Service: Vascular;  Laterality: Right;  . Colonoscopy      Hx: of   Family History  Problem Relation Age of Onset  . Hypertension Father   . Kidney disease Father   . Allergies Father   . Deep vein thrombosis Sister   . Pulmonary embolism Sister    History   Social History  . Marital Status: Single    Spouse Name: N/A    Number of Children: N/A  . Years of Education: N/A   Occupational History  . Not on file.   Social History Main Topics  . Smoking status: Former Smoker -- 0.25 packs/day for .5 years    Types: Cigarettes    Quit date: 09/08/1972  . Smokeless tobacco: Never Used  . Alcohol Use: Yes     Comment: 05/03/2013 "haven't had a glass of wine in ~ 3 months or so; sometimes will have one w/dinner"  . Drug Use: No  . Sexual Activity: Yes   Other Topics Concern  . Not on file   Social History Narrative   Former smoker- quit over 30 yrs ago   Patient is on disability    Review of Systems: Review of Systems  Constitutional: Positive for malaise/fatigue. Negative for fever, chills, weight loss and diaphoresis.  HENT: Negative for hearing loss and tinnitus.   Respiratory: Positive for cough and shortness of breath. Negative for hemoptysis, sputum production and wheezing.   Cardiovascular: Positive for chest pain. Negative for palpitations, orthopnea, claudication, leg swelling and PND.  Gastrointestinal: Negative for nausea, vomiting, abdominal pain, diarrhea and  constipation.  Genitourinary: Negative for dysuria, urgency and frequency.  Musculoskeletal: Positive for falls. Negative for back pain, myalgias and neck pain.  Skin: Negative for rash.  Neurological: Positive for loss of consciousness. Negative for dizziness, tingling, sensory change, speech change, focal weakness, seizures and headaches.  Endo/Heme/Allergies:  Does not bruise/bleed easily.  Psychiatric/Behavioral: Negative for depression and memory loss.     Physical Exam: Blood pressure 136/97, pulse 112, temperature 97.6 F (36.4 C), temperature source Oral, resp. rate 32, SpO2 100.00%. Patient desaturated to 90% O2 when supplemental Oxygen turned off and speaking normally. Physical Exam  Nursing note and vitals reviewed. Constitutional: He is oriented to person, place, and time and well-developed, well-nourished, and in no distress. No distress.  HENT:  Head: Normocephalic and atraumatic.  Mouth/Throat: Oropharynx is clear and moist.  Eyes: Conjunctivae and EOM are normal. Pupils are equal, round, and reactive to light.  Neck: Normal range of motion.  Cardiovascular: Normal heart sounds and intact distal pulses.  An irregularly irregular rhythm present. Tachycardia present.   No murmur heard. Pulmonary/Chest: Effort normal and breath sounds normal. No respiratory distress. He has no wheezes. He has no rales. He exhibits no tenderness.  Abdominal: Soft. Bowel sounds are normal. He exhibits no distension. There is no tenderness.  Musculoskeletal: He exhibits no edema.  Neurological: He is alert and oriented to person, place, and time. He has normal sensation, normal strength, normal reflexes and intact cranial nerves. He is not agitated and not disoriented. No cranial nerve deficit or sensory deficit. He has a normal Cerebellar Exam and a normal Finger-Nose-Finger Test. He shows no pronator drift.  Reflex Scores:      Patellar reflexes are 2+ on the right side and 2+ on the left  side. Skin: He is not diaphoretic.  Dialysis cathter in place at right upper chest-no signs of infection. palpable thrill at LUE AVF site, surgical incisions noted no dehiscence or drainage  Psychiatric: Affect and judgment normal.     Lab results: Basic Metabolic Panel:  Recent Labs  96/04/54 0604 06/28/13 1120  NA 138 134*  K 3.7 5.0  CL  --  93*  CO2  --  26  GLUCOSE 85 119*  BUN  --  50*  CREATININE  --  10.53*  CALCIUM  --  9.5   CBC:  Recent Labs  06/27/13 0604 06/28/13 1120  WBC  --  5.1  NEUTROABS  --  3.9  HGB 10.9* 9.3*  HCT 32.0* 28.4*  MCV  --  81.1  PLT  --  138*   Cardiac Enzymes:  Recent Labs  06/28/13 1120  TROPONINI <0.30   Urine Drug Screen: Drugs of Abuse     Component Value Date/Time   LABOPIA NONE DETECTED 12/11/2007 2233   COCAINSCRNUR NONE DETECTED 12/11/2007 2233   LABBENZ NONE DETECTED 12/11/2007 2233   AMPHETMU NONE DETECTED 12/11/2007 2233   THCU NONE DETECTED 12/11/2007 2233   LABBARB  Value: NONE DETECTED        DRUG SCREEN FOR MEDICAL PURPOSES ONLY.  IF CONFIRMATION IS NEEDED FOR ANY PURPOSE, NOTIFY LAB WITHIN 5 DAYS. 12/11/2007 2233      Imaging results:  Ct Angio Chest Pe W/cm &/or Wo Cm  06/28/2013   CLINICAL DATA:  Shortness of breath, chest pain, syncope.  EXAM: CT ANGIOGRAPHY CHEST WITH CONTRAST  TECHNIQUE: Multidetector CT imaging of the chest was performed using the standard protocol during bolus administration of intravenous contrast. Multiplanar CT image reconstructions including MIPs were obtained to evaluate the vascular anatomy.  CONTRAST:  OMNIPAQUE IOHEXOL 350 MG/ML SOLN  COMPARISON:  CHEST x-ray 06/08/2013.  FINDINGS: There are small apparent filling defects within a left lower lobe pulmonary artery near the left lung base supplying the posterior basal segment. There is respiratory  motion on these images and is difficult to determine if this is artifactual or related to a very small pulmonary embolus. No definite  other pulmonary emboli noted.  There is mediastinal adenopathy noted with partially calcified mediastinal lymph nodes. Bilateral hilar calcified lymph nodes are also present. This is presumably related to old granulomatous disease. Heart is enlarged. Calcified granuloma in the right middle lobe of. Small right pleural effusion. Scattered coronary artery calcifications are present. Chest wall soft tissues are unremarkable. Imaging into the upper abdomen shows no acute findings.  No acute bony abnormality. Imaging into the upper abdomen shows no acute findings.  Review of the MIP images confirms the above findings.  IMPRESSION: Small filling defect in a lower lobe pulmonary arterial branch. I favor this is related to respiratory motion as no other pulmonary emboli are seen, but it is difficult to completely exclude a very small pulmonary embolus.  Small right pleural effusion.  Calcified mediastinal and bilateral hilar adenopathy, presumably related to old granulomatous disease. Calcified granuloma in the right middle lobe.  Cardiomegaly.   Electronically Signed   By: Charlett Nose M.D.   On: 06/28/2013 12:18    Other results: EKG: irregularly irregular, rate 113, normal axis, TWI V4-6. QTc 530. A flutter present on previous EKG but not tachycardic.  Assessment & Plan by Problem: #Syncope Patient presented with 4-5 syncopal episodes, orthostatic VS were negative in ED, no history of seizures, and no reported seizure like activity, patient's neurologic exam normal unlikely to have CVA, patient did have surgery yesterday and a CTA in the ED reported a possible small pulmonary embolus which could be cause of syncopal episode but would be unlikely if small.  However most likely cause is arrythmia, patient has been diagnosed with a fib/flutter on his last admission for syncope 2 months ago.  He did have an Echo at that time that was not revealing. -Patient has no signs of head trauma and a normal neurologic exam,  he is not on any A/C, no need for head CT at this time. -Admit to Telemetry, inpatient -Fall precautions -Troponin x3. -Consider PT evaluation. #SOB and Tachycardia Recent surgery yesterday, patient had CTA today that showed a possible small PE. - Start Heparin infusion, obtain LE Dopplers and consider repeat CTA in AM.  If negative can discontinue heparin.  Dr. Darrick Penna who did has AVF surgery yesterday was notified. -Supplemental O2. #Atrial fibrillation -CHADS2VASC score of 1 patient has been recommended to take ASA but has not been taking it at home.  Will restart ASA. -Continue labetalol -Troponinx3 #Atypical Chest Pain Patient endorses some left sided chest pain, not worse with deep inspiration, not excertional.  Patient has a TIMI risk score of 0. As patient has not taken his aspirin in the last week, and has no known CAD although he reports he may have been told he has had an infarction in the past. He does have some TWI in V4-6 though that are present on old EKG. -Will cycle CE. #ESRD (end stage renal disease) on dialysis -TThS -Will undergo HD today -Renal diet, MVI #Anemia -Hgb 9.3 on admission,  Appears to be at baseline likely due to ESRD. -Aranesp - CBC in am. #HYPERTENSION -Continue home labetalol and amlodipine. #Prolonged QT -Avoid QT prolonging agents. #DVTPPx: Heparin drip Dispo: Disposition is deferred at this time, awaiting improvement of current medical problems. Anticipated discharge in approximately 1-2 day(s).   The patient does have a current PCP Irena Cords, MD) and does not  need an [facility] hospital follow-up appointment after discharge.  The patient does not have transportation limitations that hinder transportation to clinic appointments.  Signed: Carlynn Purl, DO 06/28/2013, 3:15 PM

## 2013-06-28 NOTE — Consult Note (Signed)
[facility] Renal Consultation Note    Indication for Consultation:  Management of ESRD/hemodialysis; anemia, hypertension/volume and secondary hyperparathyroidism  HPI: KALYB PEMBLE is a 62 y.o. male ESRD patient who is status post creation of left basilic vein transposition on 10/20 with Dr. Darrick Penna. He endorses a syncopal episode once he arrived home. EMS was called but he was not transported. Since his discharge yesterday, he reports having 4-5 syncopal episodes, weakness, dyspnea with exertion and some dizziness when standing. Similar symptoms prompted his most recent admission 8/20-8/23 where he was found to have new onset atrial fib/atrial flutter. EKG performed on admission is unchanged since his pre-op tracing. He remains in atrial flutter. CT angio of the chest cannot rule out small pulmonary embolus. At the time of this encounter, he is sitting up on the side of the bed on 2L 02, endorsing dyspnea when attempting to ambulate short distances. He denies HA, dizziness, visual disturbances, amnesia, nausea, vomiting or diarrhea. He has no post-operative pain but has requested a sling for comfort.  His last hemodialysis session was on Saturday and he is close to his estimated dry weight.  Past Medical History  Diagnosis Date  . Anemia   . AVF (arteriovenous fistula)     Left  . Secondary hyperparathyroidism   . Hypovitaminosis D   . Hypertensive urgency     H/o  . CHF (congestive heart failure)   . Exertional shortness of breath     "related to infection in my lungs right now" (05/03/2013)  . History of gout     "before I started doing the dialysis" (05/03/2013)  . ESRD (end stage renal disease) on dialysis     "[facility]; MWF" (05/03/2013)  . Dysrhythmia     Hx: aflutter  . Sleep apnea   . GERD (gastroesophageal reflux disease)   . ZOXWRUEA(540.9)    Past Surgical History  Procedure Laterality Date  . Av fistula placement Left     Dr. Charlean Sanfilippo; "I've had 2  on the left' (05/03/2013)  . Av fistula placement Right ~ 2011  . Knee arthroscopy Left   . Avgg removal Right 05/04/2013    Procedure: REMOVAL OF ARTERIOVENOUS Fistula Right Arm;  Surgeon: [name], MD;  Location: [facility] OR;  Service: Vascular;  Laterality: Right;  . Insertion of dialysis catheter Right 05/04/2013    Procedure: INSERTION OF DIALYSIS CATHETER;  Surgeon: [name], MD;  Location: [facility] OR;  Service: Vascular;  Laterality: Right;  . Colonoscopy      Hx: of   Family History  Problem Relation Age of Onset  . Hypertension Father   . Kidney disease Father   . Allergies Father   . Deep vein thrombosis Sister   . Pulmonary embolism Sister    Social History:  reports that he quit smoking about 40 years ago. His smoking use included Cigarettes. He has a .125 pack-year smoking history. He has never used smokeless tobacco. He reports that he drinks alcohol. He reports that he does not use illicit drugs.  No Known Allergies  Prior to Admission medications   Medication Sig Start Date End Date Taking? Authorizing Provider  amLODipine (NORVASC) 10 MG tablet Take 10 mg by mouth daily.     Yes Historical Provider, MD  Arginine 1000 MG TABS Take 2,000 mg by mouth daily as needed (for vitamin).    Yes Historical Provider, MD  labetalol (NORMODYNE) 200 MG tablet Take 200 mg by mouth 2 (two) times daily.  Yes Historical Provider, MD  sevelamer carbonate (RENVELA) 800 MG tablet Take 3,200 mg by mouth as needed (for kidneys).    Yes Historical Provider, MD   Current Facility-Administered Medications  Medication Dose Route Frequency Provider Last Rate Last Dose  . feeding supplement (ENSURE COMPLETE) (ENSURE COMPLETE) liquid 237 mL  237 mL Oral To ER Charles B. Bernette Mayers, MD      . ondansetron [facility]) injection 4 mg  4 mg Intravenous Once Charles B. Bernette Mayers, MD       Current Outpatient Prescriptions  Medication Sig Dispense Refill  . amLODipine (NORVASC) 10 MG tablet Take 10 mg by  mouth daily.        . Arginine 1000 MG TABS Take 2,000 mg by mouth daily as needed (for vitamin).       Marland Kitchen labetalol (NORMODYNE) 200 MG tablet Take 200 mg by mouth 2 (two) times daily.      . sevelamer carbonate (RENVELA) 800 MG tablet Take 3,200 mg by mouth as needed (for kidneys).        Labs: Basic Metabolic Panel:  Recent Labs Lab 06/27/13 0604 06/28/13 1120  NA 138 134*  K 3.7 5.0  CL  --  93*  CO2  --  26  GLUCOSE 85 119*  BUN  --  50*  CREATININE  --  10.53*  CALCIUM  --  9.5   CBC:  Recent Labs Lab 06/27/13 0604 06/28/13 1120  WBC  --  5.1  NEUTROABS  --  3.9  HGB 10.9* 9.3*  HCT 32.0* 28.4*  MCV  --  81.1  PLT  --  138*   Cardiac Enzymes:  Recent Labs Lab 06/28/13 1120  TROPONINI <0.30   Studies/Results: Ct Angio Chest Pe W/cm &/or Wo Cm  06/28/2013   CLINICAL DATA:  Shortness of breath, chest pain, syncope.  EXAM: CT ANGIOGRAPHY CHEST WITH CONTRAST  TECHNIQUE: Multidetector CT imaging of the chest was performed using the standard protocol during bolus administration of intravenous contrast. Multiplanar CT image reconstructions including MIPs were obtained to evaluate the vascular anatomy.  CONTRAST:  OMNIPAQUE IOHEXOL 350 MG/ML SOLN  COMPARISON:  CHEST x-ray 06/08/2013.  FINDINGS: There are small apparent filling defects within a left lower lobe pulmonary artery near the left lung base supplying the posterior basal segment. There is respiratory motion on these images and is difficult to determine if this is artifactual or related to a very small pulmonary embolus. No definite other pulmonary emboli noted.  There is mediastinal adenopathy noted with partially calcified mediastinal lymph nodes. Bilateral hilar calcified lymph nodes are also present. This is presumably related to old granulomatous disease. Heart is enlarged. Calcified granuloma in the right middle lobe of. Small right pleural effusion. Scattered coronary artery calcifications are present. Chest  wall soft tissues are unremarkable. Imaging into the upper abdomen shows no acute findings.  No acute bony abnormality. Imaging into the upper abdomen shows no acute findings.  Review of the MIP images confirms the above findings.  IMPRESSION: Small filling defect in a lower lobe pulmonary arterial branch. I favor this is related to respiratory motion as no other pulmonary emboli are seen, but it is difficult to completely exclude a very small pulmonary embolus.  Small right pleural effusion.  Calcified mediastinal and bilateral hilar adenopathy, presumably related to old granulomatous disease. Calcified granuloma in the right middle lobe.  Cardiomegaly.   Electronically Signed   By: Charlett Nose M.D.   On: 06/28/2013 12:18  ROS: DOE. 10 pt ROS asked and answered. All other systems negative except per HPI above.   Physical Exam: Filed Vitals:   06/28/13 1132 06/28/13 1134 06/28/13 1300 06/28/13 1331  BP: 125/83 111/86 146/94 136/97  Pulse: 101 121 88 112  Temp:    97.6 F (36.4 C)  TempSrc:    Oral  Resp:    32  SpO2:   99% 100%     General: Well developed, well nourished, AAM. Alert, cooperative and in no acute distress. Head: Normocephalic, atraumatic, left eye with injected sclera but anicteric bilat, mucus membranes are moist. Neck: Supple. JVD elevated to jawline on right. Lungs: Clear bilat without wheezes, rales, or rhonchi. Diminished at right base. Breathing is unlabored on 2L 02 via [place] Heart: Tachy irregular. No murmurs, rubs, or gallops appreciated. Abdomen: Soft, non-tender, non-distended with normoactive bowel sounds. No rebound/guarding. No obvious abdominal masses. M-S:  Strength and tone appear normal for age. Lower extremities:without edema or ischemic changes, no open wounds  Neuro: Alert and oriented X 3. Moves all extremities spontaneously. Psych:  Responds to questions appropriately with a normal affect. Dialysis Access: Rt I-J PC, Maturing L BVT placed 10/20 - trace  erythema, incisions clean/dry with dermabond  Dialysis Orders: Center: AF  on TTS . EDW 101.5 kg  HD Bath 2K/2.25 Ca  Time 4:30 Heparin 3200. Access Rt I-J PC/ Maturing L BVT placed 10/20 by Fields BFR 400 DFR 800    [name] 1 mcg IV/HD Epogen 4800   Units IV/HD  Venofer  0,  Profile 2  Recent labs: Hgb 9.7, P 3.4, Tsat 17%, Ferritin >5000 in Sept, PTH 100  Assessment/Plan: 1. Syncope - Known atrial flutter. Trop neg x 1. POD 1 from placement of left BVT. CT angio cannot exclude small pulmonary embolus. Work-up per admit. 2. ESRD -  TTS, K+ 5.0. HD today, tight heparin 3. Hypertension/volume  - SBPs 130s, Up ~ 0.5 kg by wgts here and as an outpatient. Small right pleural effusion by CT with elevated JVD. Try for UF 1-2 L as tolerated. 4. Anemia  - Hgb 9.3, consistent with op labs. Aranesp 40 ordered for here. No IV Fe for now. Low T sat with ^ Ferritin per op labs. Follow CBC 5. Metabolic bone disease -  Ca controlled op. Last Phos 3.4 on Renvela 4 ac/ 2 with snacks. Will lower to 3 ac for now and recheck renal panel in the a.m. (Patient is very compliant with binders) Continue Vit D.  6. Nutrition - Renal diet, multivitamin  Claud Kelp, PA-C [facility] Pager [phone] 06/28/2013, 2:01 PM   Patient seen and examined.  Agree with assessment and plan as above. Patient with ESRD x 2-3 years, here in August with temp 103, reporting chills after dialysis and with inflamed buttonholes.  Had resection of presumably infected buttonholes with AVF ligation. All blood and cultures were negative (except one wound cx grew multiple organisms, no SA , no strep) and temps resolved quickly and he was discharged with tunneled HD cath after 3-4 days in hospital to complete a course of IV vanc with HD. Also had  afib new onset with CHADS 1 and was started on ASA per hospitalist.  Then yesterday had surgery on L arm for new AVF (1st stage basilic vein transposition). Post op pt passed out at home ,  then again another time or two.  BP was normal in ED, +afib w VR 118, O2sats were low at 88% and a  CT angio was done last night which did not show any definitive PE, although he is on heparin IV now.  CXR not done. He is stable now on dialysis, just above dry wt.  Will order orthostatics for after HD later tonight, r/o vol depletion. Will follow.  [name]  MD Pager [phone]    Cell  [phone] 06/28/2013, 6:09 PM

## 2013-06-28 NOTE — Progress Notes (Signed)
ANTICOAGULATION CONSULT NOTE - Initial Consult  Pharmacy Consult for Heparin Indication: pulmonary embolus  No Known Allergies  Patient Measurements: Height: 6' 1.5" (186.7 cm) Weight: 232 lb 9.4 oz (105.5 kg) IBW/kg (Calculated) : 81.05 Heparin Dosing Weight:  103 kg  Vital Signs: Temp: 98.6 F (37 C) (10/21 1539) Temp src: Oral (10/21 1539) BP: 125/83 mmHg (10/21 1539) Pulse Rate: 96 (10/21 1539)  Labs:  Recent Labs  06/27/13 0604 06/28/13 1120  HGB 10.9* 9.3*  HCT 32.0* 28.4*  PLT  --  138*  CREATININE  --  10.53*  TROPONINI  --  <0.30    Estimated Creatinine Clearance: 9.6 ml/min (by C-G formula based on Cr of 10.53).   Medical History: Past Medical History  Diagnosis Date  . Anemia   . AVF (arteriovenous fistula)     Left  . Secondary hyperparathyroidism   . Hypovitaminosis D   . Hypertensive urgency     H/o  . CHF (congestive heart failure)   . Exertional shortness of breath     "related to infection in my lungs right now" (05/03/2013)  . History of gout     "before I started doing the dialysis" (05/03/2013)  . ESRD (end stage renal disease) on dialysis     "[facility]; MWF" (05/03/2013)  . Dysrhythmia     Hx: aflutter  . Sleep apnea   . GERD (gastroesophageal reflux disease)   . Headache(784.0)     Medications:  Prescriptions prior to admission  Medication Sig Dispense Refill  . amLODipine (NORVASC) 10 MG tablet Take 10 mg by mouth daily.        . Arginine 1000 MG TABS Take 2,000 mg by mouth daily as needed (for vitamin).       Marland Kitchen labetalol (NORMODYNE) 200 MG tablet Take 200 mg by mouth 2 (two) times daily.      . sevelamer carbonate (RENVELA) 800 MG tablet Take 3,200 mg by mouth as needed (for kidneys).         Assessment: 62 y/o M with ESRD (TTS) had creation of L basilic vein transposition 10/20. Developed syncopal episodes, weakness, DOE, and dizziness. Patient is in aflutter and CT angio of the chest cannot rule out small pulmonary  embolus. CHADS2=1. Patient has no bleeding complications other than know anemia of chronic dz.  Labs: Hgb 10.9 >> 9.3 today. Plts low 138.  Goal of Therapy:  Heparin level 0.3-0.7 units/ml Monitor platelets by anticoagulation protocol: Yes   Plan:  Start IV heparin 4000 unit IV bolus then infusion at 1700 units/hr. Check heparin level in 8 hrs. Daily heparin level and CBC.  Merilynn Finland, [employer] 06/28/2013,3:46 PM

## 2013-06-28 NOTE — ED Notes (Signed)
Pt placed in sling per request of admitting md

## 2013-06-28 NOTE — Progress Notes (Signed)
Family member states that Mr Daphine Deutscher was en route to hospital as we speak for evaluation in the ED

## 2013-06-28 NOTE — ED Notes (Signed)
Attempted report x1. 

## 2013-06-28 NOTE — ED Notes (Signed)
Per EMS- pt has had 5 syncopal episodes since yesterday. Pt has surgery on left arm for fistula repair. Pt states that he has been weak and SOB with exertion. Pt last dialysis was Saturday and is due today. Pt was told that he is anemic as well. Pt states that he passes out when he stands up. Denies any dizziness or lightheadness prior to events.

## 2013-06-29 ENCOUNTER — Encounter (HOSPITAL_COMMUNITY): Payer: Self-pay | Admitting: Student

## 2013-06-29 DIAGNOSIS — R55 Syncope and collapse: Secondary | ICD-10-CM

## 2013-06-29 DIAGNOSIS — I4891 Unspecified atrial fibrillation: Secondary | ICD-10-CM | POA: Diagnosis not present

## 2013-06-29 DIAGNOSIS — N186 End stage renal disease: Secondary | ICD-10-CM

## 2013-06-29 DIAGNOSIS — R0602 Shortness of breath: Secondary | ICD-10-CM

## 2013-06-29 DIAGNOSIS — D631 Anemia in chronic kidney disease: Secondary | ICD-10-CM | POA: Diagnosis not present

## 2013-06-29 DIAGNOSIS — I82409 Acute embolism and thrombosis of unspecified deep veins of unspecified lower extremity: Secondary | ICD-10-CM | POA: Diagnosis present

## 2013-06-29 LAB — TROPONIN I
Troponin I: <0.3 ng/mL (ref <0.30)
Troponin I: <0.3 ng/mL (ref <0.30)
Troponin I: <0.3 ng/mL (ref <0.30)

## 2013-06-29 LAB — CBC
MCH: 26.7 pg (ref 26.0–34.0)
MCV: 80.4 fL (ref 78.0–100.0)
Platelets: 135 10*3/uL — ABNORMAL LOW (ref 150–400)
RBC: 3.41 MIL/uL — ABNORMAL LOW (ref 4.22–5.81)
RDW: 15.7 % — ABNORMAL HIGH (ref 11.5–15.5)

## 2013-06-29 LAB — HEPARIN LEVEL (UNFRACTIONATED)
Heparin Unfractionated: 0.18 IU/mL — ABNORMAL LOW (ref 0.30–0.70)
Heparin Unfractionated: <0.1 IU/mL — ABNORMAL LOW (ref 0.30–0.70)
Heparin Unfractionated: 0.4 [IU]/mL (ref 0.30–0.70)

## 2013-06-29 LAB — HEPATIC FUNCTION PANEL
ALT: 146 U/L — ABNORMAL HIGH (ref 0–53)
AST: 183 U/L — ABNORMAL HIGH (ref 0–37)
Alkaline Phosphatase: 112 U/L (ref 39–117)
Bilirubin, Direct: 0.3 mg/dL (ref 0.0–0.3)
Indirect Bilirubin: 0.3 mg/dL (ref 0.3–0.9)
Total Protein: 7.1 g/dL (ref 6.0–8.3)

## 2013-06-29 LAB — RENAL FUNCTION PANEL
BUN: 26 mg/dL — ABNORMAL HIGH (ref 6–23)
CO2: 29 mEq/L (ref 19–32)
Calcium: 9.2 mg/dL (ref 8.4–10.5)
Creatinine, Ser: 6.48 mg/dL — ABNORMAL HIGH (ref 0.50–1.35)
GFR calc Af Amer: 10 mL/min — ABNORMAL LOW (ref >90)
GFR calc non Af Amer: 8 mL/min — ABNORMAL LOW (ref >90)
Phosphorus: 3.2 mg/dL (ref 2.3–4.6)
Sodium: 138 mEq/L (ref 135–145)

## 2013-06-29 MED ORDER — WARFARIN VIDEO
Freq: Once | Status: AC
  Administered 2013-06-30: 18:00:00

## 2013-06-29 MED ORDER — NEPRO/CARBSTEADY PO LIQD
237.0000 mL | Freq: Three times a day (TID) | ORAL | Status: DC
  Administered 2013-06-29 – 2013-07-04 (×12): 237 mL via ORAL

## 2013-06-29 MED ORDER — LIDOCAINE HCL (PF) 1 % IJ SOLN: 5.0000 mL | INTRAMUSCULAR | Status: DC | PRN

## 2013-06-29 MED ORDER — NEPRO/CARBSTEADY PO LIQD: 237.0000 mL | ORAL | Status: DC | PRN

## 2013-06-29 MED ORDER — ACETAMINOPHEN 325 MG PO TABS
650.0000 mg | ORAL_TABLET | Freq: Four times a day (QID) | ORAL | Status: DC | PRN
  Administered 2013-06-29 – 2013-06-30 (×4): 650 mg via ORAL
  Filled 2013-06-29 (×3): qty 2

## 2013-06-29 MED ORDER — WARFARIN SODIUM 10 MG PO TABS
10.0000 mg | ORAL_TABLET | Freq: Once | ORAL | Status: AC
  Administered 2013-06-29: 10 mg via ORAL
  Filled 2013-06-29: qty 1

## 2013-06-29 MED ORDER — SODIUM CHLORIDE 0.9 % IV SOLN: 100.0000 mL | INTRAVENOUS | Status: DC | PRN

## 2013-06-29 MED ORDER — PENTAFLUOROPROP-TETRAFLUOROETH EX AERO: 1.0000 "application " | INHALATION_SPRAY | CUTANEOUS | Status: DC | PRN

## 2013-06-29 MED ORDER — ALTEPLASE 2 MG IJ SOLR
2.0000 mg | Freq: Once | INTRAMUSCULAR | Status: AC | PRN
  Filled 2013-06-29: qty 2

## 2013-06-29 MED ORDER — HEPARIN BOLUS VIA INFUSION
2000.0000 [IU] | Freq: Once | INTRAVENOUS | Status: AC
  Administered 2013-06-29: 2000 [IU] via INTRAVENOUS
  Filled 2013-06-29: qty 2000

## 2013-06-29 MED ORDER — HEPARIN SODIUM (PORCINE) 1000 UNIT/ML DIALYSIS
20.0000 [IU]/kg | INTRAMUSCULAR | Status: DC | PRN
  Filled 2013-06-29: qty 2

## 2013-06-29 MED ORDER — WARFARIN - PHARMACIST DOSING INPATIENT
Freq: Every day | Status: DC
  Administered 2013-06-29 – 2013-07-03 (×2)

## 2013-06-29 MED ORDER — RENA-VITE PO TABS
1.0000 | ORAL_TABLET | Freq: Every day | ORAL | Status: DC
  Administered 2013-06-29: 1 via ORAL
  Administered 2013-06-30: 21:00:00 via ORAL
  Administered 2013-07-01 – 2013-07-02 (×2): 1 via ORAL
  Administered 2013-07-03: 22:00:00 via ORAL
  Filled 2013-06-29 (×7): qty 1

## 2013-06-29 MED ORDER — HEPARIN SODIUM (PORCINE) 1000 UNIT/ML DIALYSIS
1000.0000 [IU] | INTRAMUSCULAR | Status: DC | PRN
  Filled 2013-06-29: qty 1

## 2013-06-29 MED ORDER — LIDOCAINE-PRILOCAINE 2.5-2.5 % EX CREA: 1.0000 "application " | TOPICAL_CREAM | CUTANEOUS | Status: DC | PRN

## 2013-06-29 NOTE — H&P (Signed)
INTERNAL MEDICINE TEACHING SERVICE Attending Admission Note  Date: 06/29/2013  Patient name: Trevor [name]  Medical record number: [id number]  Date of birth: Jun 09, 1951    I have seen and evaluated [name] and discussed their care with the Residency Team.  Patient seen and examined.  62 yr old AAM w/ ESRD, Afib on ASA, HTN, anemia chronic disease, presented due to multiple syncopal episodes.  CTA chest was inconclusive for PE, but now LE DVT shows evidence of DVT in RLE posterior tibial vein. At this point, I would not repeat a CTA chest. He is agreeable to coumadin. He understands risks vs benefits and choose to anticoagulate. Continue heparin gtt. Discuss with vascular surgery risk of bleeding in setting of recent AVF creation.  Consult cardiology. He has Afib and likely had RVR as a result of PE. Plans for future cardioversion? It seems this did not go through.  He will need to discuss this with cards again.  Jonah Blue, DO, FACP Faculty [facility] Internal Medicine Residency Program 06/29/2013, 11:33 AM

## 2013-06-29 NOTE — Consult Note (Signed)
CARDIOLOGY CONSULT NOTE   Patient ID: Trevor [name] MRN: [id number] DOB/AGE: 1951-02-24 62 y.o.  Admit date: 06/28/2013  Primary Physician   Irena Cords, MD Primary Cardiologist   Dr. Armanda Magic Reason for Consultation   Atrial-Fib, Syncope  HPI: Trevor [name] is a 62 y.o. male with a history of CHF but not CAD. The patient is not in acute distress, and is A&O x 3. He reports that since his LUE AV fistula surgery on 06/27/13, he has had 5 syncopal episodes. He receives no warning prior to these episodes, and describes it as someone "turning off a switch." 3 of these episodes occurred while he was walking, and 2 of these episodes while he was sitting. He denies injuring himself from any of the falls, and is not sure if he hit his head or not. The patient reports that all episodes were witnessed by his girlfriend, and that he typically remained unconscious for a little while. During the last episode, he woke up and EMS was already there. He denies any loss of bowel or bladder function, and witnesses deny any seizure-like activity. He states that this has only happened once before, about 2 months ago, and that he was walking down a hallway and started to cough, and then "blacked-out."   He reports that he had a heart attack in the 1990s, but denies any treatment, procedures, or follow-up. He also endorses some intermittent SOB, which he states feels like more of a "panic attack" and makes him think it is related to anxiety. He also reports that a few days prior to the surgery he experienced some transient pain along his left axillary line. He denies any noticeable heart palpitations, dizziness, or tachycardia. He has noticed no change or increased symptoms with exertion. The patient also reports that he has lost about 40 pounds over the past 3-4 months, despite no change in diet or exercise.     Past Medical History  Diagnosis Date  . Anemia   . AVF (arteriovenous fistula)    Left  . Secondary hyperparathyroidism   . Hypovitaminosis D   . Hypertensive urgency     H/o  . CHF (congestive heart failure)   . Exertional shortness of breath     "related to infection in my lungs right now" (05/03/2013)  . History of gout     "before I started doing the dialysis" (05/03/2013)  . ESRD (end stage renal disease) on dialysis     "[facility]; MWF" (05/03/2013)  . Dysrhythmia     Hx: aflutter  . Sleep apnea   . GERD (gastroesophageal reflux disease)   . [id number](478.2)      Past Surgical History  Procedure Laterality Date  . Av fistula placement Left     Dr. Charlean Sanfilippo; "I've had 2 on the left' (05/03/2013)  . Av fistula placement Right ~ 2011  . Knee arthroscopy Left   . Avgg removal Right 05/04/2013    Procedure: REMOVAL OF ARTERIOVENOUS Fistula Right Arm;  Surgeon: [name], MD;  Location: [facility] OR;  Service: Vascular;  Laterality: Right;  . Insertion of dialysis catheter Right 05/04/2013    Procedure: INSERTION OF DIALYSIS CATHETER;  Surgeon: [name], MD;  Location[facility] OR;  Service: Vascular;  Laterality: Right;  . Colonoscopy      Hx: of    No Known Allergies  I have reviewed the patient's current medications . amLODipine  10 mg Oral Daily  . aspirin  EC  81 mg Oral Daily  . darbepoetin (ARANESP) injection - DIALYSIS  40 mcg Intravenous Q Tue-HD  . doxercalciferol  1 mcg Intravenous Q T,Th,Sa-HD  . feeding supplement (NEPRO CARB STEADY)  237 mL Oral TID BM  . labetalol  200 mg Oral BID  . multivitamin  1 tablet Oral QHS  . pantoprazole  40 mg Oral Q1200  . sevelamer carbonate  2,400 mg Oral TID WC  . sodium chloride  3 mL Intravenous Q12H   . heparin 2,250 Units/hr (06/29/13 1236)   sodium chloride, acetaminophen, ondansetron, sodium chloride  Prior to Admission medications   Medication Sig Start Date End Date Taking? Authorizing Provider  amLODipine (NORVASC) 10 MG tablet Take 10 mg by mouth daily.     Yes Historical Provider, MD    Arginine 1000 MG TABS Take 2,000 mg by mouth daily as needed (for vitamin).    Yes Historical Provider, MD  labetalol (NORMODYNE) 200 MG tablet Take 200 mg by mouth 2 (two) times daily.   Yes Historical Provider, MD  sevelamer carbonate (RENVELA) 800 MG tablet Take 3,200 mg by mouth as needed (for kidneys).    Yes Historical Provider, MD     History   Social History  . Marital Status: Divorced    Spouse Name: N/A    Number of Children: N/A  . Years of Education: N/A   Occupational History  . Disabled    Social History Main Topics  . Smoking status: Former Smoker -- 0.25 packs/day for .5 years    Types: Cigarettes    Quit date: 09/08/1972  . Smokeless tobacco: Never Used  . Alcohol Use: Yes     Comment: 05/03/2013 "haven't had a glass of wine in ~ 3 months or so; sometimes will have one w/dinner"  . Drug Use: No  . Sexual Activity: Yes   Other Topics Concern  . Not on file   Social History Narrative   Former smoker- quit over 30 yrs ago, has a girlfriend   Patient is on disability    Family Status  Relation Status Death Age  . Father Deceased 62    No CAD   Family History  Problem Relation Age of Onset  . Hypertension Father   . Kidney disease Father   . Allergies Father   . Deep vein thrombosis Sister   . Pulmonary embolism Sister     ROS:  Full 14 point review of systems complete and found to be negative unless listed above.  Physical Exam: Blood pressure 128/94, pulse 88, temperature 98.6 F (37 C), temperature source Oral, resp. rate 22, height 6' 1.5" (1.867 m), weight 223 lb 1.7 oz (101.2 kg), SpO2 96.00%.  General: Well developed, well nourished, male in no acute distress Head: Eyes PERRLA, No xanthomas.   Normocephalic and atraumatic, oropharynx without edema or exudate. Dentition: good Lungs: Clear to auscultation. Heart: Heart irregular rate and rhythm with S1, S2. No murmur. pulses are 2+ all 4 extrem. Fistula LUE very tender, and swollen. RUE fistula  has been removed. Neck: No carotid bruits. No lymphadenopathy. Slight JVD. Abdomen: Bowel sounds present, abdomen soft and non-tender without masses or hernias noted. Msk:  No spine or cva tenderness. No weakness, no joint deformities or effusions. Extremities: No clubbing or cyanosis. Swollen left arm. Neuro: Alert and oriented X 3. No focal deficits noted. Psych:  Good affect, responds appropriately Skin: No rashes or lesions noted.  Labs:   Lab Results  Component Value Date  WBC 3.8* 06/29/2013   HGB 9.1* 06/29/2013   HCT 27.4* 06/29/2013   MCV 80.4 06/29/2013   PLT 135* 06/29/2013    Recent Labs  06/29/13 0012  INR 1.60*     Recent Labs Lab 06/29/13 0545  NA 138  K 4.1  CL 98  CO2 29  BUN 26*  CREATININE 6.48*  CALCIUM 9.2  PROT 7.1  BILITOT 0.6  ALKPHOS 112  ALT 146*  AST 183*  GLUCOSE 95  ALBUMIN 3.1*  3.3*    Recent Labs  06/28/13 0012 06/28/13 1120 06/29/13 0545 06/29/13 1034  TROPONINI <0.30 <0.30 <0.30 <0.30   Echo: 04/29/2013 Conclusions - Left ventricle: The cavity size was mildly to moderately dilated. Wall thickness was increased in a pattern of mild LVH. Systolic function was normal. The estimated ejection fraction was in the range of 50% to 55%. Wall motion was normal; there were no regional wall motion abnormalities. - Aortic valve: Sclerosis without stenosis. No significant regurgitation. - Aorta: Aortic root dimension: 33mm (ED). - Left atrium: The atrium was moderately dilated. - Right ventricle: The cavity size was mildly dilated. Systolic function was mildly reduced. - Right atrium: The atrium was mildly dilated. - Pulmonary arteries: PA peak pressure: 49mm Hg (S). - Pericardium, extracardiac: A trivial pericardial effusion was identified posterior to the heart.  ECG:  Atrial flutter, RVR Vent. rate 113 BPM PR interval * ms QRS duration 104 ms QT/QTc 378/530 ms P-R-T axes * 85 32 Radiology:  Ct Angio Chest Pe W/cm  &/or Wo Cm 06/28/2013   CLINICAL DATA:  Shortness of breath, chest pain, syncope.  EXAM: CT ANGIOGRAPHY CHEST WITH CONTRAST  TECHNIQUE: Multidetector CT imaging of the chest was performed using the standard protocol during bolus administration of intravenous contrast. Multiplanar CT image reconstructions including MIPs were obtained to evaluate the vascular anatomy.  CONTRAST:  OMNIPAQUE IOHEXOL 350 MG/ML SOLN  COMPARISON:  CHEST x-ray 06/08/2013.  FINDINGS: There are small apparent filling defects within a left lower lobe pulmonary artery near the left lung base supplying the posterior basal segment. There is respiratory motion on these images and is difficult to determine if this is artifactual or related to a very small pulmonary embolus. No definite other pulmonary emboli noted.  There is mediastinal adenopathy noted with partially calcified mediastinal lymph nodes. Bilateral hilar calcified lymph nodes are also present. This is presumably related to old granulomatous disease. Heart is enlarged. Calcified granuloma in the right middle lobe of. Small right pleural effusion. Scattered coronary artery calcifications are present. Chest wall soft tissues are unremarkable. Imaging into the upper abdomen shows no acute findings.  No acute bony abnormality. Imaging into the upper abdomen shows no acute findings.  Review of the MIP images confirms the above findings.  IMPRESSION: Small filling defect in a lower lobe pulmonary arterial branch. I favor this is related to respiratory motion as no other pulmonary emboli are seen, but it is difficult to completely exclude a very small pulmonary embolus.  Small right pleural effusion.  Calcified mediastinal and bilateral hilar adenopathy, presumably related to old granulomatous disease. Calcified granuloma in the right middle lobe.  Cardiomegaly.   Electronically Signed   By: Charlett Nose M.D.   On: 06/28/2013 12:18    ASSESSMENT AND PLAN:   The patient was seen  today by Dr. Eldridge Dace, the patient evaluated and the data reviewed.   Principal Problem:   Syncope - No obvious cardiac source, his heart rate has not been extremely  fast or slow. He has been in atrial flutter since his hospitalization in August. He does not have consistent or ongoing symptoms related to the atrial flutter. Continue to observe on telemetry. Will order orthostatic vital signs. Will arrange an event monitor as an outpatient. Then followup with cardiology, [name].  Otherwise, per primary M.D. Active Problems:   HYPERTENSION   Atrial fibrillation   ESRD (end stage renal disease) on dialysis   CHF (congestive heart failure)   Anemia   HTN (hypertension)   Signed: Theodore Demark, PA-C 06/29/2013 2:52 PM Beeper [phone]  Co-Sign MD I have examined the patient and reviewed assessment and plan and discussed with patient.  Agree with above as stated.  Patient has swollen left arm.  He had AFib a few months ago and was put on Coumadin.  Now in Atrial flutter with unexplained syncope.  Watch on tele looking for signs of bradycardia.  Syncope occurred with walking.  He should try to walk in th ehalls with assistance.  [name] to follow in AM.  Corky Crafts.

## 2013-06-29 NOTE — Progress Notes (Addendum)
Subjective: [name] was seen and examined at bedside. He reports feeling better today than prior to admission and has had no more syncopal episodes.  He does confirm LOC prior to admission but does not recall if he hit his head or not.  He denies any chest pain or leg pain but does report recent worsening of shortness of breath with minimal exertion.    Objective: Vital signs in last 24 hours: Filed Vitals:   06/29/13 0935 06/29/13 0937 06/29/13 0939 06/29/13 1111  BP: 110/84 109/82 120/86 128/94  Pulse: 87 98 85 88  Temp: 98.6 F (37 C)     TempSrc: Oral     Resp: 22     Height:      Weight:      SpO2: 96%      Weight change:   Intake/Output Summary (Last 24 hours) at 06/29/13 1246 Last data filed at 06/29/13 0900  Gross per 24 hour  Intake  532.7 ml  Output   1500 ml  Net -967.3 ml   Vitals reviewed. General: resting in bed, NAD HEENT: PERRL, EOMI, very prominent and tortuous RIJ ?JVD Cardiac: Irregularly irregular, +SEM loudest sternal borders Pulm: clear to auscultation bilaterally, no wheezes, rales, or rhonchi Abd: soft, mild tenderness to palpation RUQ and epigastric region, nondistended, BS present Ext: warm and well perfused, no pedal edema, +2dp b/l, LAVF + palpable thrill, scars from prior RAVF Neuro: alert and oriented X3, cranial nerves II-XII grossly intact, strength and sensation to light touch equal in bilateral upper and lower extremities  Lab Results: Basic Metabolic Panel:  Recent Labs Lab 06/28/13 1120 06/29/13 0545  NA 134* 138  K 5.0 4.1  CL 93* 98  CO2 26 29  GLUCOSE 119* 95  BUN 50* 26*  CREATININE 10.53* 6.48*  CALCIUM 9.5 9.2  PHOS  --  3.2   Liver Function Tests:  Recent Labs Lab 06/29/13 0545  AST 183*  ALT 146*  ALKPHOS 112  BILITOT 0.6  PROT 7.1  ALBUMIN 3.1*  3.3*   CBC:  Recent Labs Lab 06/28/13 1120 06/29/13 0545  WBC 5.1 3.8*  NEUTROABS 3.9  --   HGB 9.3* 9.1*  HCT 28.4* 27.4*  MCV 81.1 80.4  PLT 138*  135*   Cardiac Enzymes:  Recent Labs Lab 06/28/13 1120 06/29/13 0545 06/29/13 1034  TROPONINI <0.30 <0.30 <0.30   Coagulation:  Recent Labs Lab 06/29/13 0012  LABPROT 18.6*  INR 1.60*   Urine Drug Screen: Drugs of Abuse     Component Value Date/Time   LABOPIA NONE DETECTED 12/11/2007 2233   COCAINSCRNUR NONE DETECTED 12/11/2007 2233   LABBENZ NONE DETECTED 12/11/2007 2233   AMPHETMU NONE DETECTED 12/11/2007 2233   THCU NONE DETECTED 12/11/2007 2233   LABBARB  Value: NONE DETECTED        DRUG SCREEN FOR MEDICAL PURPOSES ONLY.  IF CONFIRMATION IS NEEDED FOR ANY PURPOSE, NOTIFY LAB WITHIN 5 DAYS. 12/11/2007 2233    Studies/Results: Ct Angio Chest Pe W/cm &/or Wo Cm  06/28/2013   CLINICAL DATA:  Shortness of breath, chest pain, syncope.  EXAM: CT ANGIOGRAPHY CHEST WITH CONTRAST  TECHNIQUE: Multidetector CT imaging of the chest was performed using the standard protocol during bolus administration of intravenous contrast. Multiplanar CT image reconstructions including MIPs were obtained to evaluate the vascular anatomy.  CONTRAST:  OMNIPAQUE IOHEXOL 350 MG/ML SOLN  COMPARISON:  CHEST x-ray 06/08/2013.  FINDINGS: There are small apparent filling defects within a left lower  lobe pulmonary artery near the left lung base supplying the posterior basal segment. There is respiratory motion on these images and is difficult to determine if this is artifactual or related to a very small pulmonary embolus. No definite other pulmonary emboli noted.  There is mediastinal adenopathy noted with partially calcified mediastinal lymph nodes. Bilateral hilar calcified lymph nodes are also present. This is presumably related to old granulomatous disease. Heart is enlarged. Calcified granuloma in the right middle lobe of. Small right pleural effusion. Scattered coronary artery calcifications are present. Chest wall soft tissues are unremarkable. Imaging into the upper abdomen shows no acute findings.  No acute bony  abnormality. Imaging into the upper abdomen shows no acute findings.  Review of the MIP images confirms the above findings.  IMPRESSION: Small filling defect in a lower lobe pulmonary arterial branch. I favor this is related to respiratory motion as no other pulmonary emboli are seen, but it is difficult to completely exclude a very small pulmonary embolus.  Small right pleural effusion.  Calcified mediastinal and bilateral hilar adenopathy, presumably related to old granulomatous disease. Calcified granuloma in the right middle lobe.  Cardiomegaly.   Electronically Signed   By: Charlett Nose M.D.   On: 06/28/2013 12:18   Medications: I have reviewed the patient's current medications. Scheduled Meds: . amLODipine  10 mg Oral Daily  . aspirin EC  81 mg Oral Daily  . darbepoetin (ARANESP) injection - DIALYSIS  40 mcg Intravenous Q Tue-HD  . doxercalciferol  1 mcg Intravenous Q T,Th,Sa-HD  . feeding supplement (NEPRO CARB STEADY)  237 mL Oral TID BM  . heparin  2,000 Units Intravenous Once  . labetalol  200 mg Oral BID  . multivitamin  1 tablet Oral QHS  . pantoprazole  40 mg Oral Q1200  . sevelamer carbonate  2,400 mg Oral TID WC  . sodium chloride  3 mL Intravenous Q12H   Continuous Infusions: . heparin 2,250 Units/hr (06/29/13 1236)   PRN Meds:.sodium chloride, acetaminophen, ondansetron, sodium chloride Assessment/Plan: Principal Problem:   Syncope Active Problems:   HYPERTENSION   Atrial fibrillation   ESRD (end stage renal disease) on dialysis   CHF (congestive heart failure)   Anemia   HTN (hypertension)  #Syncope secondary to uncontrolled Atrial Fibrillation--patient presented with 4-5 syncopal episodes leading to admission, orthostatic VS were negative in ED, no history of seizures, and no reported seizure like activity.  Neurologic exam with no obvious deficits, unlikely to have CVA.  S/p L basilic vein transposition fistula 06/27/13 by Dr. Darrick Penna, and CTA in the ED reported a  small filling defect, but could favor respiratory motion, difficult to exclude small PE.  Repeat CTA not done given presence of R leg DVT in posterior tibial vein given prelim vascular lab report, and already started on IV heparin.  Troponin x4 negative.  -consult cardiology--last seen by [name] in August 2014  #Dyspnea and Tachycardia--likely secondary to PE (given presence of RLE DVT and ?filling defect on CTA) vs. CHF.  However, Echo in 04/2013: showed mild to moderately dilated LV, mild LVH, EF 50-55%, no regional wall motion abnormalities.  PA peak pressure .  - Continue IV heparin and will transition to starting coumadin.  Discussed anticoagulation with Dr. Darrick Penna who is okay with coumadin as is patient.   -Supplemental O2 as needed   #Atrial fibrillation--CHADS2VASC score of 1 patient has been recommended to take ASA but has not been taking it at home.  -d/c asa given start  of coumadin -Continue labetalol   #Atypical Chest Pain--resolved.  Initially reported left sided chest pain, not worse with deep inspiration, not excertional. Possibly secondary to PE.  TIMI risk score of 0, cardiac enzymes negative. TWI in V4-6 on EKG that were present before as well.  -appreciate cardiology following  #ESRD (end stage renal disease) on dialysis--TThS.  Follows with Dr. Arrie Aran.  -HD tomorrow -Appreciate renal following -Renal diet -Trend renal function  #Anemia--Hgb 9.3 on admission, Appears to be at baseline likely due to ESRD.  -Aranesp   #HYPERTENSION  -Continue home labetalol and amlodipine   #Prolonged QT--qtc 530 on EKG 06/28/13 -Avoid QT prolonging agents  #DVTPPx: Heparin drip  Dispo: Disposition is deferred at this time, awaiting improvement of current medical problems.  Anticipated discharge in approximately 2-3 day(s).   The patient does have a current PCP Irena Cords, MD) and does not need an [facility] hospital follow-up appointment after discharge.  The  patient does not have transportation limitations that hinder transportation to clinic appointments.  Services Needed at time of discharge: Y = Yes, Blank = No PT:   OT:   RN:   Equipment:   Other:     LOS: 1 day   Darden Palmer, MD 06/29/2013, 12:46 PM

## 2013-06-29 NOTE — Progress Notes (Signed)
ANTICOAGULATION CONSULT NOTE - Follow Up Consult  Pharmacy Consult for Heparin Indication: Confirmed RLE DVT and possible PE  No Known Allergies  Patient Measurements: Height: 6' 1.5" (186.7 cm) Weight: 223 lb 1.7 oz (101.2 kg) IBW/kg (Calculated) : 81.05 Heparin Dosing Weight: 101.2 kg  Vital Signs: Temp: 98.6 F (37 C) (10/22 0935) Temp src: Oral (10/22 0935) BP: 128/94 mmHg (10/22 1111) Pulse Rate: 88 (10/22 1111)  Labs:  Recent Labs  06/27/13 0604 06/28/13 0012 06/28/13 1120 06/29/13 0012 06/29/13 0545 06/29/13 0915  HGB 10.9*  --  9.3*  --  9.1*  --   HCT 32.0*  --  28.4*  --  27.4*  --   PLT  --   --  138*  --  135*  --   LABPROT  --   --   --  18.6*  --   --   INR  --   --   --  1.60*  --   --   HEPARINUNFRC  --   --   --  0.18*  --  <0.10*  CREATININE  --   --  10.53*  --  6.48*  --   TROPONINI  --  <0.30 <0.30  --  <0.30  --     Estimated Creatinine Clearance: 15.3 ml/min (by C-G formula based on Cr of 6.48).   Medications:  Heparin @ 1900 units/hr  Assessment: 62 y.o. M who continues on heparin for RLE DVT (confirmed via dopplers this morning) and possible PE (via CT angio on 10/21). Heparin level this morning is SUBtherapeutic (HL <0.1, goal of 0.3-0.7). The heparin is running at the appropriate rate and there are no issues with the line. Hgb/Hct/Plt stable -- no overt s/sx of bleeding noted. Noted recent VVS surgery for first stage of LUE AVF formation -- patient tolerated bolus yesterday evening without any bleeding issues. Will re-bolus (lower bolus d/t recent surgery) and increase drip rate.   Given the patient's Afib, confirmed RLE DVT, and possible PE -- the patient will also need to start oral anticoagulation. Will f/u with the primary team regarding plans for this.   Goal of Therapy:  Heparin level 0.3-0.7 units/ml Monitor platelets by anticoagulation protocol: Yes   Plan:  1. Heparin 2000 units bolus x 1 2. Increase heparin drip rate to  2250 units/hr (22.5 ml/hr) 3. Will f/u plans to start oral anticoagulation 4. Will continue to monitor for any signs/symptoms of bleeding and will follow up with heparin level in 8 hours   Georgina Pillion, PharmD, BCPS Clinical Pharmacist Pager: [phone] 06/29/2013 11:34 AM

## 2013-06-29 NOTE — Progress Notes (Signed)
ANTICOAGULATION CONSULT NOTE - Initial Consult  Pharmacy Consult for warfarin Indication: atrial fibrillation  No Known Allergies  Patient Measurements: Height: 6' 1.5" (186.7 cm) Weight: 223 lb 1.7 oz (101.2 kg) IBW/kg (Calculated) : 81.05 Heparin Dosing Weight:   Vital Signs: Temp: 98.6 F (37 C) (10/22 1800) Temp src: Oral (10/22 1800) BP: 130/84 mmHg (10/22 1800) Pulse Rate: 84 (10/22 1800)  Labs:  Recent Labs  06/27/13 0604  06/28/13 1120 06/29/13 0012 06/29/13 0545 06/29/13 0915 06/29/13 1034  HGB 10.9*  --  9.3*  --  9.1*  --   --   HCT 32.0*  --  28.4*  --  27.4*  --   --   PLT  --   --  138*  --  135*  --   --   LABPROT  --   --   --  18.6*  --   --   --   INR  --   --   --  1.60*  --   --   --   HEPARINUNFRC  --   --   --  0.18*  --  <0.10*  --   CREATININE  --   --  10.53*  --  6.48*  --   --   TROPONINI  --   < > <0.30  --  <0.30  --  <0.30  < > = values in this interval not displayed.  Estimated Creatinine Clearance: 15.3 ml/min (by C-G formula based on Cr of 6.48).   Medical History: Past Medical History  Diagnosis Date  . Anemia   . AVF (arteriovenous fistula)     Left  . Secondary hyperparathyroidism   . Hypovitaminosis D   . Hypertensive urgency     H/o  . CHF (congestive heart failure)   . Exertional shortness of breath     "related to infection in my lungs right now" (05/03/2013)  . History of gout     "before I started doing the dialysis" (05/03/2013)  . ESRD (end stage renal disease) on dialysis     "[facility]; MWF" (05/03/2013)  . Dysrhythmia     Hx: aflutter  . Sleep apnea   . GERD (gastroesophageal reflux disease)   . Headache(784.0)     Medications:  Scheduled:  . amLODipine  10 mg Oral Daily  . darbepoetin (ARANESP) injection - DIALYSIS  40 mcg Intravenous Q Tue-HD  . doxercalciferol  1 mcg Intravenous Q T,Th,Sa-HD  . feeding supplement (NEPRO CARB STEADY)  237 mL Oral TID BM  . labetalol  200 mg Oral BID  .  multivitamin  1 tablet Oral QHS  . pantoprazole  40 mg Oral Q1200  . sevelamer carbonate  2,400 mg Oral TID WC  . sodium chloride  3 mL Intravenous Q12H  . [START ON 06/30/2013] warfarin   Does not apply Once  . Warfarin - Pharmacist Dosing Inpatient   Does not apply q1800    Assessment: 62 yr old male presented with having experienced several syncopal episodes. He has a history of CHF but that does not appear to be causing this problem. The pt was supposed to be taking aspirin and coumadin in preparation for cardioversion but he has not been taking them. Now to start on coumadin for his a.fib. Goal of Therapy:  INR 2-3    Plan:  Will give coumadin 10 mg tonight. Daily INR Ordered coumadin book and video for this pt.  Eugene Garnet 06/29/2013,8:08 PM

## 2013-06-29 NOTE — Progress Notes (Addendum)
VASCULAR LAB PRELIMINARY  PRELIMINARY  PRELIMINARY  PRELIMINARY  Bilateral lower extremity venous duplex  completed.    Preliminary report: Right leg: acute to subacute DVT in the posterior tibial vein.  Enlarged lymph noted seen in groin.  Left leg: Negative for deep and superficial vein thrombosis   Travon Crochet, RVT 06/29/2013, 11:04 AM  Amended by Florestine Avers, RVT 06/29/2013   11:11 am

## 2013-06-29 NOTE — Progress Notes (Signed)
Pt given instructions on how to watch coumadin video. Pt states he wants to continue watching the news and will watch the video at around 7pm. Will monitor.  Kathlene November, Shloma Roggenkamp [place]

## 2013-06-29 NOTE — Progress Notes (Signed)
ANTICOAGULATION CONSULT NOTE - Follow Up Consult  Pharmacy Consult for Heparin  Indication: pulmonary embolus  No Known Allergies  Patient Measurements: Height: 6' 1.5" (186.7 cm) Weight: 223 lb 1.7 oz (101.2 kg) IBW/kg (Calculated) : 81.05  Vital Signs: Temp: 99.2 F (37.3 C) (10/21 2139) Temp src: Oral (10/21 2139) BP: 131/85 mmHg (10/21 2139) Pulse Rate: 98 (10/21 2139)  Labs:  Recent Labs  06/27/13 0604 06/28/13 1120 06/29/13 0012  HGB 10.9* 9.3*  --   HCT 32.0* 28.4*  --   PLT  --  138*  --   LABPROT  --   --  18.6*  INR  --   --  1.60*  HEPARINUNFRC  --   --  0.18*  CREATININE  --  10.53*  --   TROPONINI  --  <0.30  --     Estimated Creatinine Clearance: 9.4 ml/min (by C-G formula based on Cr of 10.53).   Medications: -Heparin at 1700 units/hr  Assessment: 62 y/o M with ESRD (HD TTS) on heparin for possible PE. Hgb 9.3, Plts 138, no overt bleeding noted, first HL is 0.18.   Goal of Therapy:  Heparin level 0.3-0.7 units/ml Monitor platelets by anticoagulation protocol: Yes   Plan:  -Increase heparin drip to 1900 units/hr -Check 8 hour HL at 1000 -Daily CBC/HL -Monitor for bleeding -F/U plans for anticoagulation   Thank you for allowing me to take part in this patient's care,  Abran Duke, PharmD Clinical Pharmacist Phone: [phone] Pager: [phone] 06/29/2013 1:32 AM

## 2013-06-29 NOTE — Progress Notes (Signed)
[facility] Progress Note  Subjective:   Dislikes food. No SOB  Objective Filed Vitals:   06/29/13 0530 06/29/13 0935 06/29/13 0937 06/29/13 0939  BP: 132/76 110/84 109/82 120/86  Pulse: 81 87 98 85  Temp: 97.8 F (36.6 C) 98.6 F (37 C)    TempSrc: Oral Oral    Resp: 20 22    Height:      Weight:      SpO2: 95% 96%     Physical Exam General: NAD Heart: tachy irreg Lungs: grossly clear, somewhat hyper-resnant Abdomen: soft  Extremities: no sig LE edema Dialysis Access: Rt I-J PC, Maturing L BVT placed 10/20 - trace erythema, incisions clean/dry with dermabond  Dialysis Orders: Center: AF on TTS .  EDW 101.5 kg HD Bath 2K/2.25 Ca Time 4:30 Heparin 3200. Access Rt I-J PC/ Maturing L BVT placed 10/20 by Fields BFR 400 DFR 800  [name] 1 mcg IV/HD Epogen 4800 Units IV/HD Venofer 0, Profile 2  Recent labs: Hgb 9.7, P 3.4, Tsat 17%, Ferritin >5000 in Sept, PTH 100  Assessment/Plan: 1. Syncope - Known atrial flutter. Trop neg x 1. POD 1 from placement of left BVT. CT angio cannot exclude small pulmonary embolus. See #9 Work-up per admit; wonder if he needs an event monitor after d/c 2. ESRD - TTS, K+4.1 post HD yesterday   tight heparin 3. Hypertension/volume - patient. Small right pleural effusion by CT. Net UF 1.5 yesterday - does not appear to have post HD weight 4. Anemia - Hgb 9.1, consistent with op labs. Aranesp 40 ordered for here. No IV Fe for now. Low T sat with ^ Ferritin per op labs. Follow CBC; recheck Fe studies and ferritin; check hemocult due to low prior tsat; ^ ferritin worrisome 5. Metabolic bone disease - Ca controlled op. Last Phos 3.4 on Renvela 4 ac/ 2 with snacks. Will lower to 3 ac for now and recheck renal panel in the a.m. (Patient is very compliant with binders) Continue Vit D.  6. Nutrition - Renal diet, multivitamin 7. Elevated LFTs - new since last checked in August; also noted to have a ferritin >5000 when last checked at dialysis;  repeat LFTs with pre HD labs - needs further diagnosistic w/u if not trending down. Hepb B s Ag neg and Hep C neg in August. 8. Thrombocytopenia - 130s down from 160 - 170s in August; follow 9. Acute to subacute DVT in right lower leg - anticoag per pharmacy  Sheffield Slider, PA-C [facility] Beeper [phone] 06/29/2013,11:05 AM  LOS: 1 day   Patient seen and examined.  Agree with assessment and plan as above.  No signs of vol depletion, orthostatics are normal and pt is right at dry wt today.  Unclear cause of multiple episodes of syncope which started just after vascular access surgery under [facility].  Pt says he felt much better after HD and thinks the HD helped.  Maybe an untoward response to the anesthetic?  Work up in progress.  HD tomorrow.  [name]  MD Pager [phone]    Cell  [phone] 06/29/2013, 12:07 PM     Additional Objective Labs: Basic Metabolic Panel:  Recent Labs Lab 06/27/13 0604 06/28/13 1120 06/29/13 0545  NA 138 134* 138  K 3.7 5.0 4.1  CL  --  93* 98  CO2  --  26 29  GLUCOSE 85 119* 95  BUN  --  50* 26*  CREATININE  --  10.53* 6.48*  CALCIUM  --  9.5 9.2  PHOS  --   --  3.2   Liver Function Tests:  Recent Labs Lab 06/29/13 0545  AST 183*  ALT 146*  ALKPHOS 112  BILITOT 0.6  PROT 7.1  ALBUMIN 3.1*  3.3*   CBC:  Recent Labs Lab 06/27/13 0604 06/28/13 1120 06/29/13 0545  WBC  --  5.1 3.8*  NEUTROABS  --  3.9  --   HGB 10.9* 9.3* 9.1*  HCT 32.0* 28.4* 27.4*  MCV  --  81.1 80.4  PLT  --  138* 135*  Cardiac Enzymes:  Recent Labs Lab 06/28/13 0012 06/28/13 1120 06/29/13 0545  TROPONINI <0.30 <0.30 <0.30  Studies/Results: Ct Angio Chest Pe W/cm &/or Wo Cm  06/28/2013   CLINICAL DATA:  Shortness of breath, chest pain, syncope.  EXAM: CT ANGIOGRAPHY CHEST WITH CONTRAST  TECHNIQUE: Multidetector CT imaging of the chest was performed using the standard protocol during bolus administration of intravenous contrast.  Multiplanar CT image reconstructions including MIPs were obtained to evaluate the vascular anatomy.  CONTRAST:  OMNIPAQUE IOHEXOL 350 MG/ML SOLN  COMPARISON:  CHEST x-ray 06/08/2013.  FINDINGS: There are small apparent filling defects within a left lower lobe pulmonary artery near the left lung base supplying the posterior basal segment. There is respiratory motion on these images and is difficult to determine if this is artifactual or related to a very small pulmonary embolus. No definite other pulmonary emboli noted.  There is mediastinal adenopathy noted with partially calcified mediastinal lymph nodes. Bilateral hilar calcified lymph nodes are also present. This is presumably related to old granulomatous disease. Heart is enlarged. Calcified granuloma in the right middle lobe of. Small right pleural effusion. Scattered coronary artery calcifications are present. Chest wall soft tissues are unremarkable. Imaging into the upper abdomen shows no acute findings.  No acute bony abnormality. Imaging into the upper abdomen shows no acute findings.  Review of the MIP images confirms the above findings.  IMPRESSION: Small filling defect in a lower lobe pulmonary arterial branch. I favor this is related to respiratory motion as no other pulmonary emboli are seen, but it is difficult to completely exclude a very small pulmonary embolus.  Small right pleural effusion.  Calcified mediastinal and bilateral hilar adenopathy, presumably related to old granulomatous disease. Calcified granuloma in the right middle lobe.  Cardiomegaly.   Electronically Signed   By: Charlett Nose M.D.   On: 06/28/2013 12:18   Medications: . heparin 1,900 Units/hr (06/29/13 0757)   . amLODipine  10 mg Oral Daily  . aspirin EC  81 mg Oral Daily  . darbepoetin (ARANESP) injection - DIALYSIS  40 mcg Intravenous Q Tue-HD  . doxercalciferol  1 mcg Intravenous Q T,Th,Sa-HD  . labetalol  200 mg Oral BID  . pantoprazole  40 mg Oral Q1200  .  sevelamer carbonate  2,400 mg Oral TID WC  . sodium chloride  3 mL Intravenous Q12H

## 2013-06-30 ENCOUNTER — Encounter (HOSPITAL_COMMUNITY): Payer: Self-pay | Admitting: Vascular Surgery

## 2013-06-30 DIAGNOSIS — R55 Syncope and collapse: Secondary | ICD-10-CM | POA: Diagnosis not present

## 2013-06-30 DIAGNOSIS — D649 Anemia, unspecified: Secondary | ICD-10-CM

## 2013-06-30 DIAGNOSIS — I4891 Unspecified atrial fibrillation: Secondary | ICD-10-CM | POA: Diagnosis not present

## 2013-06-30 DIAGNOSIS — I2699 Other pulmonary embolism without acute cor pulmonale: Secondary | ICD-10-CM | POA: Diagnosis not present

## 2013-06-30 DIAGNOSIS — N186 End stage renal disease: Secondary | ICD-10-CM | POA: Diagnosis not present

## 2013-06-30 DIAGNOSIS — D631 Anemia in chronic kidney disease: Secondary | ICD-10-CM | POA: Diagnosis not present

## 2013-06-30 LAB — CBC
HCT: 26.2 % — ABNORMAL LOW (ref 39.0–52.0)
MCHC: 32.4 g/dL (ref 30.0–36.0)
MCV: 81.4 fL (ref 78.0–100.0)
Platelets: 144 10*3/uL — ABNORMAL LOW (ref 150–400)
RBC: 3.22 MIL/uL — ABNORMAL LOW (ref 4.22–5.81)
WBC: 3.7 10*3/uL — ABNORMAL LOW (ref 4.0–10.5)

## 2013-06-30 LAB — HEPATIC FUNCTION PANEL
ALT: 115 U/L — ABNORMAL HIGH (ref 0–53)
AST: 101 U/L — ABNORMAL HIGH (ref 0–37)
Albumin: 3.1 g/dL — ABNORMAL LOW (ref 3.5–5.2)
Alkaline Phosphatase: 109 U/L (ref 39–117)
Bilirubin, Direct: 0.2 mg/dL (ref 0.0–0.3)
Indirect Bilirubin: 0.3 mg/dL (ref 0.3–0.9)
Total Bilirubin: 0.5 mg/dL (ref 0.3–1.2)
Total Protein: 7.1 g/dL (ref 6.0–8.3)

## 2013-06-30 LAB — RENAL FUNCTION PANEL
BUN: 33 mg/dL — ABNORMAL HIGH (ref 6–23)
CO2: 24 mEq/L (ref 19–32)
Chloride: 94 mEq/L — ABNORMAL LOW (ref 96–112)
Creatinine, Ser: 8.09 mg/dL — ABNORMAL HIGH (ref 0.50–1.35)
GFR calc non Af Amer: 6 mL/min — ABNORMAL LOW (ref >90)

## 2013-06-30 LAB — FERRITIN: Ferritin: 989 ng/mL — ABNORMAL HIGH (ref 22–322)

## 2013-06-30 MED ORDER — HYDROCODONE-ACETAMINOPHEN 5-325 MG PO TABS
1.0000 | ORAL_TABLET | Freq: Once | ORAL | Status: AC
  Administered 2013-06-30: 1 via ORAL
  Filled 2013-06-30: qty 1

## 2013-06-30 MED ORDER — ACETAMINOPHEN 325 MG PO TABS
ORAL_TABLET | ORAL | Status: AC
  Filled 2013-06-30: qty 2

## 2013-06-30 MED ORDER — DOXERCALCIFEROL 4 MCG/2ML IV SOLN
INTRAVENOUS | Status: AC
  Filled 2013-06-30: qty 2

## 2013-06-30 MED ORDER — WARFARIN SODIUM 5 MG PO TABS
5.0000 mg | ORAL_TABLET | Freq: Once | ORAL | Status: AC
  Administered 2013-06-30: 5 mg via ORAL
  Filled 2013-06-30: qty 1

## 2013-06-30 NOTE — Progress Notes (Signed)
       Patient Name: Trevor [name] Date of Encounter: 06/30/2013    SUBJECTIVE:Denies cardiac complaints. Had multiple fainting episodes(4) prior to admission. Had syncope 2 weeks prior to admissiom. Denies orthopnea and PND.  TELEMETRY:  Atrial fibrillation with poor rate control. Filed Vitals:   06/30/13 1134 06/30/13 1156 06/30/13 1216 06/30/13 1744  BP: 132/86 136/87 126/91 125/87  Pulse: 97  106 84  Temp: 98 F (36.7 C)  98 F (36.7 C) 98.2 F (36.8 C)  TempSrc: Oral  Oral Oral  Resp: 20  24 21   Height:      Weight: 224 lb 6.9 oz (101.8 kg)     SpO2: 98%  96% 96%    Intake/Output Summary (Last 24 hours) at 06/30/13 1747 Last data filed at 06/30/13 1700  Gross per 24 hour  Intake    240 ml  Output   4001 ml  Net  -3761 ml    LABS: Basic Metabolic Panel:  Recent Labs  16/10/96 0545 06/30/13 0430  NA 138 132*  K 4.1 3.9  CL 98 94*  CO2 29 24  GLUCOSE 95 90  BUN 26* 33*  CREATININE 6.48* 8.09*  CALCIUM 9.2 9.3  PHOS 3.2 2.4   CBC:  Recent Labs  06/28/13 1120 06/29/13 0545 06/30/13 0430  WBC 5.1 3.8* 3.7*  NEUTROABS 3.9  --   --   HGB 9.3* 9.1* 8.5*  HCT 28.4* 27.4* 26.2*  MCV 81.1 80.4 81.4  PLT 138* 135* 144*   Cardiac Enzymes:  Recent Labs  06/28/13 1120 06/29/13 0545 06/29/13 1034  TROPONINI <0.30 <0.30 <0.30     Radiology/Studies:  No new data  Physical Exam: Blood pressure 125/87, pulse 84, temperature 98.2 F (36.8 C), temperature source Oral, resp. rate 21, height 6' 1.5" (1.867 m), weight 224 lb 6.9 oz (101.8 kg), SpO2 96.00%. Weight change: 1 lb 8.7 oz (0.7 kg)  Laying flat. Irregular heart rhythm. No significant murmur. Clear lungs No edema.  ASSESSMENT:  1. Recurrent syncope without obvious cardiac explanation to this point. He does have atrial flutter/fibrillation and I have concern that there could be a cardiac arrhythmic cause given the sudden nature of the episode. Hopefully orthostasis has been excluded.  Possible small pulmonary emboli should not cause syncope. 2. ESRD 3. H/O hypertension  Plan:  1. Needs a 30 day continuous monitor and perhaps a loop recorder if the monitor is unrevealing. R/O Tachy-Brady syndrome.  Selinda Eon 06/30/2013, 5:47 PM

## 2013-06-30 NOTE — Progress Notes (Signed)
[facility] Progress Note  Subjective:   No c/o  Objective Filed Vitals:   06/30/13 0704 06/30/13 0730 06/30/13 0800 06/30/13 0830  BP: 133/93 133/91 139/100 132/95  Pulse: 80 88 72 92  Temp:      TempSrc:      Resp: 20 21 15 14   Height:      Weight:      SpO2:       Physical Exam  Goal 4.5 General: sitting on side of bed during HD  Heart: RRR Lungs: no wheezes or rales Abdomen: soft Extremities: no LE edema Dialysis Access: Rt I-J PC, Maturing L BVT placed 10/20 - mild left arm swelling since AVF placement   Dialysis Orders: Center: AF on TTS .  EDW 101.5 kg HD Bath 2K/2.25 Ca Time 4:30 Heparin 3200. Access Rt I-J PC/ Maturing L BVT placed 10/20 by Fields BFR 400 DFR 800  [name] 1 mcg IV/HD Epogen 4800 Units IV/HD Venofer 0, Profile 2  Recent labs: Hgb 9.7, P 3.4, Tsat 17%, Ferritin >5000 in Sept, PTH 100   Assessment/Plan:  1. Syncope - Known atrial flutter. Trop neg x 1. POD 1 from placement of left BVT. CT angio cannot exclude small pulmonary embolus. See #9 Work-up per admit; cards planning event monitor after d/c; pre HD weight today 105.2 -EDW 101.5; no sign orthostais 2. ESRD - TTS, K3.9 - 4 K bath today tight heparin 3. Hypertension/volume - patient. Small right pleural effusion by CT. Net UF 1.5 yesterday - does not appear to have post HD weight 4. Anemia - Hgb 8.5 - decliing. Aranesp 40 ordered for here. No IV Fe for now. Low T sat with ^ Ferritin per op labs. Follow CBC; recheck Fe studies and ferritin; check hemocult due to low prior tsat; ^ ferritin worrisome 5. Metabolic bone disease - Ca controlled op. Last Phos 2.4 on Renvela 4 ac/ 2 with snacks. Lowered to 3 ac for 10/22 and (Patient is very compliant with binders) Continue Vit D. -  6. Nutrition - Renal diet, multivitamin 7. Elevated LFTs - new since last checked in August; also noted to have a ferritin >5000 when last checked at dialysis; repeat LFTs with pre HD labs - needs further  diagnosistic w/u if not trending down. Hepb B s Ag neg and Hep C neg in August. 8. Thrombocytopenia - 130s down from 160  Now up to 144; 160 - 170s in August; follow 9. Acute to subacute DVT in right lower leg - anticoag per pharmacy  Sheffield Slider, PA-C [facility] Beeper [phone] 06/30/2013,8:39 AM  LOS: 2 days   Patient seen and examined.  Agree with assessment and plan as above. [name]  MD Pager [phone]    Cell  [phone] 06/30/2013, 10:32 AM    Additional Objective Labs: Basic Metabolic Panel:  Recent Labs Lab 06/28/13 1120 06/29/13 0545 06/30/13 0430  NA 134* 138 132*  K 5.0 4.1 3.9  CL 93* 98 94*  CO2 26 29 24   GLUCOSE 119* 95 90  BUN 50* 26* 33*  CREATININE 10.53* 6.48* 8.09*  CALCIUM 9.5 9.2 9.3  PHOS  --  3.2 2.4   Lab Results  Component Value Date   INR 1.26 06/29/2013   INR 1.60* 06/29/2013   Liver Function Tests:  Recent Labs Lab 06/29/13 0545 06/30/13 0430  AST 183*  --   ALT 146*  --   ALKPHOS 112  --   BILITOT 0.6  --  PROT 7.1  --   ALBUMIN 3.1*  3.3* 3.0*   CBC:  Recent Labs Lab 06/28/13 1120 06/29/13 0545 06/30/13 0430  WBC 5.1 3.8* 3.7*  NEUTROABS 3.9  --   --   HGB 9.3* 9.1* 8.5*  HCT 28.4* 27.4* 26.2*  MCV 81.1 80.4 81.4  PLT 138* 135* 144*   Blood Culture    Component Value Date/Time   SDES BLOOD RIGHT FOREARM 06/28/2013 0017   SPECREQUEST BOTTLES DRAWN AEROBIC ONLY 7CC 06/28/2013 0017   CULT  Value:        BLOOD CULTURE RECEIVED NO GROWTH TO DATE CULTURE WILL BE HELD FOR 5 DAYS BEFORE ISSUING A FINAL NEGATIVE REPORT Performed at [facility] 06/28/2013 0017   REPTSTATUS PENDING 06/28/2013 0017    Cardiac Enzymes:  Recent Labs Lab 06/28/13 0012 06/28/13 1120 06/29/13 0545 06/29/13 1034  TROPONINI <0.30 <0.30 <0.30 <0.30  Medications: . heparin 2,250 Units/hr (06/30/13 0629)   . amLODipine  10 mg Oral Daily  . darbepoetin (ARANESP) injection - DIALYSIS  40 mcg  Intravenous Q Tue-HD  . doxercalciferol  1 mcg Intravenous Q T,Th,Sa-HD  . feeding supplement (NEPRO CARB STEADY)  237 mL Oral TID BM  . labetalol  200 mg Oral BID  . multivitamin  1 tablet Oral QHS  . pantoprazole  40 mg Oral Q1200  . sevelamer carbonate  2,400 mg Oral TID WC  . sodium chloride  3 mL Intravenous Q12H  . warfarin   Does not apply Once  . Warfarin - Pharmacist Dosing Inpatient   Does not apply [phone]

## 2013-06-30 NOTE — Progress Notes (Signed)
ANTICOAGULATION CONSULT NOTE - Follow Up Consult  Pharmacy Consult for Heparin + Warfarin Indication: Confirmed RLE DVT and possible small PE  No Known Allergies  Patient Measurements: Height: 6' 1.5" (186.7 cm) Weight: 234 lb 2.1 oz (106.2 kg) IBW/kg (Calculated) : 81.05 Heparin Dosing Weight: 101.2 kg  Vital Signs: Temp: 97.3 F (36.3 C) (10/23 0700) Temp src: Axillary (10/23 0700) BP: 132/95 mmHg (10/23 0830) Pulse Rate: 92 (10/23 0830)  Labs:  Recent Labs  06/28/13 1120  06/29/13 0012 06/29/13 0545 06/29/13 0915 06/29/13 1034 06/29/13 2040 06/30/13 0430  HGB 9.3*  --   --  9.1*  --   --   --  8.5*  HCT 28.4*  --   --  27.4*  --   --   --  26.2*  PLT 138*  --   --  135*  --   --   --  144*  LABPROT  --   --  18.6*  --   --   --  15.5*  --   INR  --   --  1.60*  --   --   --  1.26  --   HEPARINUNFRC  --   < > 0.18*  --  <0.10*  --  0.40 0.31  CREATININE 10.53*  --   --  6.48*  --   --   --  8.09*  TROPONINI <0.30  --   --  <0.30  --  <0.30  --   --   < > = values in this interval not displayed.  Estimated Creatinine Clearance: 12.5 ml/min (by C-G formula based on Cr of 8.09).   Medications:  Heparin @ 2250 units/hr  Assessment: 62 y.o. M who continues on heparin + warfarin for RLE DVT (confirmed via dopplers this morning) and possible PE (via CT angio on 10/21). VTE overlap D#2/5 as recommended per CHEST guidelines however best practice is to continue until INR >2 x 24 hours. Heparin level this morning is therapeutic though trending down (HL <0.31 << 0.4, goal of 0.3-0.7). Noted recent VVS surgery for first stage of LUE AVF formation. Hgb/Hct/Plt slight drop -- no overt s/sx of bleeding noted. Will increase heparin drip rate slightly to keep within goal range.   The patient's INR this morning remains SUBtherapeutic after starting warfarin yesterday evening (INR 1.26, goal of 2-3). It is noted that the patient's LFTs and INR were elevated on admit but are trending  down. AST/ALT 101/115 << 183/146, INR 1.6 (admit) -- will keep this in mind with warfarin dosing and reduce dose today. Will plan to recheck LFTs on 10/24 a.m  Goal of Therapy:  INR 2-3 Heparin level 0.3-0.7 units/ml Monitor platelets by anticoagulation protocol: Yes   Plan:  1. Increase heparin drip rate to 2350 units/hr (23.5 ml/hr) 2. Warfarin 5 mg x 1 dose at 1800 today 3. Will order LFTs for 10/24 a.m 4. Will continue to monitor for any signs/symptoms of bleeding and will follow up with heparin level and PT/INR in the a.m.   Georgina Pillion, PharmD, BCPS Clinical Pharmacist Pager: [phone] 06/30/2013 8:44 AM

## 2013-06-30 NOTE — Progress Notes (Addendum)
Subjective: Patient seen at dialysis.  Reports he currently fells well. No further syncopal or near syncopal episodes.  He denies any SOB. Reports mild pain in LUE. Overnight monitoring on telemetry only notable for A fib and a few PVCs. Objective: Vital signs in last 24 hours: Filed Vitals:   06/30/13 0700 06/30/13 0704 06/30/13 0730 06/30/13 0800  BP: 123/89 133/93 133/91 139/100  Pulse: 88 80 88 72  Temp: 97.3 F (36.3 C)     TempSrc: Axillary     Resp: 20 20 21 15   Height:      Weight: 234 lb 2.1 oz (106.2 kg)     SpO2: 100%      Weight change: 1 lb 8.7 oz (0.7 kg)  Intake/Output Summary (Last 24 hours) at 06/30/13 0808 Last data filed at 06/29/13 1700  Gross per 24 hour  Intake  821.5 ml  Output      0 ml  Net  821.5 ml   Vitals reviewed.  General: resting in bed, NAD   Cardiac: Irregularly irregular Pulm: clear to auscultation bilaterally, no wheezes, rales, or rhonchi  Abd: soft, nontender, nondistended, BS present  Ext: warm and well perfused, no pedal edema, +2dp b/l, LAVF + palpable thrill, tender, scars from prior RAVF  Neuro: alert and oriented X3  Lab Results: Basic Metabolic Panel:  Recent Labs Lab 06/29/13 0545 06/30/13 0430  NA 138 132*  K 4.1 3.9  CL 98 94*  CO2 29 24  GLUCOSE 95 90  BUN 26* 33*  CREATININE 6.48* 8.09*  CALCIUM 9.2 9.3  PHOS 3.2 2.4   Liver Function Tests:  Recent Labs Lab 06/29/13 0545 06/30/13 0430  AST 183*  --   ALT 146*  --   ALKPHOS 112  --   BILITOT 0.6  --   PROT 7.1  --   ALBUMIN 3.1*  3.3* 3.0*   CBC:  Recent Labs Lab 06/28/13 1120 06/29/13 0545 06/30/13 0430  WBC 5.1 3.8* 3.7*  NEUTROABS 3.9  --   --   HGB 9.3* 9.1* 8.5*  HCT 28.4* 27.4* 26.2*  MCV 81.1 80.4 81.4  PLT 138* 135* 144*   Cardiac Enzymes:  Recent Labs Lab 06/28/13 1120 06/29/13 0545 06/29/13 1034  TROPONINI <0.30 <0.30 <0.30   Coagulation:  Recent Labs Lab 06/29/13 0012 06/29/13 2040  LABPROT 18.6* 15.5*  INR  1.60* 1.26   Urine Drug Screen: Drugs of Abuse     Component Value Date/Time   LABOPIA NONE DETECTED 12/11/2007 2233   COCAINSCRNUR NONE DETECTED 12/11/2007 2233   LABBENZ NONE DETECTED 12/11/2007 2233   AMPHETMU NONE DETECTED 12/11/2007 2233   THCU NONE DETECTED 12/11/2007 2233   LABBARB  Value: NONE DETECTED        DRUG SCREEN FOR MEDICAL PURPOSES ONLY.  IF CONFIRMATION IS NEEDED FOR ANY PURPOSE, NOTIFY LAB WITHIN 5 DAYS. 12/11/2007 2233     Micro Results: No results found for this or any previous visit (from the past 240 hour(s)). Studies/Results: Ct Angio Chest Pe W/cm &/or Wo Cm  06/28/2013   CLINICAL DATA:  Shortness of breath, chest pain, syncope.  EXAM: CT ANGIOGRAPHY CHEST WITH CONTRAST  TECHNIQUE: Multidetector CT imaging of the chest was performed using the standard protocol during bolus administration of intravenous contrast. Multiplanar CT image reconstructions including MIPs were obtained to evaluate the vascular anatomy.  CONTRAST:  OMNIPAQUE IOHEXOL 350 MG/ML SOLN  COMPARISON:  CHEST x-ray 06/08/2013.  FINDINGS: There are small apparent filling defects within  a left lower lobe pulmonary artery near the left lung base supplying the posterior basal segment. There is respiratory motion on these images and is difficult to determine if this is artifactual or related to a very small pulmonary embolus. No definite other pulmonary emboli noted.  There is mediastinal adenopathy noted with partially calcified mediastinal lymph nodes. Bilateral hilar calcified lymph nodes are also present. This is presumably related to old granulomatous disease. Heart is enlarged. Calcified granuloma in the right middle lobe of. Small right pleural effusion. Scattered coronary artery calcifications are present. Chest wall soft tissues are unremarkable. Imaging into the upper abdomen shows no acute findings.  No acute bony abnormality. Imaging into the upper abdomen shows no acute findings.  Review of the MIP images  confirms the above findings.  IMPRESSION: Small filling defect in a lower lobe pulmonary arterial branch. I favor this is related to respiratory motion as no other pulmonary emboli are seen, but it is difficult to completely exclude a very small pulmonary embolus.  Small right pleural effusion.  Calcified mediastinal and bilateral hilar adenopathy, presumably related to old granulomatous disease. Calcified granuloma in the right middle lobe.  Cardiomegaly.   Electronically Signed   By: Charlett Nose M.D.   On: 06/28/2013 12:18   Medications: I have reviewed the patient's current medications. Scheduled Meds: . amLODipine  10 mg Oral Daily  . darbepoetin (ARANESP) injection - DIALYSIS  40 mcg Intravenous Q Tue-HD  . doxercalciferol  1 mcg Intravenous Q T,Th,Sa-HD  . feeding supplement (NEPRO CARB STEADY)  237 mL Oral TID BM  . labetalol  200 mg Oral BID  . multivitamin  1 tablet Oral QHS  . pantoprazole  40 mg Oral Q1200  . sevelamer carbonate  2,400 mg Oral TID WC  . sodium chloride  3 mL Intravenous Q12H  . warfarin   Does not apply Once  . Warfarin - Pharmacist Dosing Inpatient   Does not apply q1800   Continuous Infusions: . heparin 2,250 Units/hr (06/30/13 0629)   PRN Meds:.sodium chloride, sodium chloride, sodium chloride, acetaminophen, feeding supplement (NEPRO CARB STEADY), heparin, heparin, lidocaine (PF), lidocaine-prilocaine, ondansetron, pentafluoroprop-tetrafluoroeth, sodium chloride Assessment/Plan: #Syncope-- no obvious cardiac source per cardiology  -Monitor on telemetry -Event monitor as outpatient -Follow up with [name] after discharge #Possible small pulmonary embolism and Acute/Subacute right DVT- -Patient's tachycardia and SOB has resolved, currently O2 Sats are 97-100% on RA - Continue IV heparin per pharmacy and  transition to coumadin.  -Supplemental O2 as needed   #Possible chronic systolic congestive heart failure Patient has a history of CHF on his chart.   However last echo in August had an EF of 50-55% which is only slightly below normal, patient does not clinically appear to be in acute decompensated heart failure, and his SOB is like more likely due to PE. #Atrial fibrillation--CHADS2VASC score of 1  -Patient on Heparin and transitioning to Coumadin for PE/DVT -Continue labetalol  #Atypical Chest Pain--resolved. TIMI risk score of 0, cardiac enzymes negative. TWI in V4-6 on EKG that were present before as well.  #ESRD (end stage renal disease) on dialysis--TThS. Follows with Dr. Arrie Aran.  -HD today -Appreciate renal following  -Renal diet  -Trend renal function  #Anemia--Hgb 9.3 on admission, Appears to be at baseline likely due to ESRD.  -Aranesp  #HYPERTENSION  -Continue home labetalol and amlodipine  #Prolonged QT--qtc 530 on EKG 06/28/13  -Avoid QT prolonging agents  #DVTPPx: Heparin drip  Dispo: Disposition is deferred at this time,  patient is ESRD and needs to be therapeutic on coumadin prior to discharge.  The patient does have a current PCP Irena Cords, MD) and does not need an [facility] hospital follow-up appointment after discharge.  The patient does not have transportation limitations that hinder transportation to clinic appointments.   .Services Needed at time of discharge: Y = Yes, Blank = No PT:   OT:   RN:   Equipment:   Other:     LOS: 2 days   Carlynn Purl, DO 06/30/2013, 8:08 AM

## 2013-06-30 NOTE — Progress Notes (Signed)
  Date: 06/30/2013  Patient name: Trevor [name]  Medical record number: [id number]  Date of birth: Mar 04, 1951   This patient has been seen and the plan of care was discussed with the house staff. Please see their note for complete details. I concur with their findings with the following additions/corrections: Appreciate cardiology input.  Would not use lovenox in this ESRD patient, continue heparin gtt. He has a LE DVT. Continue coumadin, wait until therapeutic before D/C as he is not a candidate for LMWH at this time due to difficulty with dosing in such a short interval regardless.  No further syncope or signs of hemodynamic instability.  Jonah Blue, DO, FACP Faculty [facility] Internal Medicine Residency Program 06/30/2013, 11:42 AM

## 2013-06-30 NOTE — Clinical Documentation Improvement (Signed)
THIS DOCUMENT IS NOT A PERMANENT PART OF THE MEDICAL RECORD  Please update your documentation with the medical record to reflect your response to this query. If you need help knowing how to do this please call [phone].  06/30/13  Dear Dr. [facility],  In a better effort to capture your patient's severity of illness, reflect appropriate length of stay and utilization of resources, a review of the patient medical record has revealed the following indicators the diagnosis of Heart Failure.    Based on your clinical judgment, please clarify and document CHF TYPE AND ACUITY in a progress note and/or discharge summary the clinical condition associated with the following supporting information:  In responding to this query please exercise your independent judgment.  The fact that a query is asked, does not imply that any particular answer is desired or expected.  Possible Clinical Conditions?  Chronic Systolic Congestive Heart Failure Chronic Diastolic Congestive Heart Failure Chronic Systolic & Diastolic Congestive Heart Failure Acute Systolic Congestive Heart Failure Acute Diastolic Congestive Heart Failure Acute Systolic & Diastolic Congestive Heart Failure Acute on Chronic Systolic Congestive Heart Failure Acute on Chronic Diastolic Congestive Heart Failure Acute on Chronic Systolic & Diastolic  Congestive Heart Failure Other Condition Cannot Clinically Determine  Supporting Information:  Risk Factors:(As per notes) " Pt has hx of CHF" Signs & Symptoms:(As per notes) " Dyspnea and Tachycardia--likely secondary to PE (given presence of RLE DVT and ?filling defect on CTA) vs. CHF."   Reviewed: additional documentation in the medical record  Thank You,  Nevin Bloodgood RN, BSN, CCDS  Clinical Documentation Specialist: [phone] Cell=[phone] Health Information Management [place]

## 2013-06-30 NOTE — Procedures (Signed)
I was present at this dialysis session. I have reviewed the session itself and made appropriate changes.   [name]  MD Pager [phone]    Cell  [phone] 06/30/2013, 9:28 AM

## 2013-06-30 NOTE — Progress Notes (Signed)
Patient complain of pain in Left arm, 7 out of 10. Patient stated Tylenol is not strong enough.  MD notified.  Will continue to monitor.

## 2013-07-01 DIAGNOSIS — I82409 Acute embolism and thrombosis of unspecified deep veins of unspecified lower extremity: Secondary | ICD-10-CM

## 2013-07-01 DIAGNOSIS — I120 Hypertensive chronic kidney disease with stage 5 chronic kidney disease or end stage renal disease: Secondary | ICD-10-CM

## 2013-07-01 DIAGNOSIS — I10 Essential (primary) hypertension: Secondary | ICD-10-CM | POA: Diagnosis not present

## 2013-07-01 DIAGNOSIS — I4891 Unspecified atrial fibrillation: Secondary | ICD-10-CM | POA: Diagnosis not present

## 2013-07-01 DIAGNOSIS — R55 Syncope and collapse: Secondary | ICD-10-CM | POA: Diagnosis not present

## 2013-07-01 DIAGNOSIS — I2699 Other pulmonary embolism without acute cor pulmonale: Secondary | ICD-10-CM | POA: Diagnosis not present

## 2013-07-01 DIAGNOSIS — D631 Anemia in chronic kidney disease: Secondary | ICD-10-CM | POA: Diagnosis not present

## 2013-07-01 DIAGNOSIS — M79609 Pain in unspecified limb: Secondary | ICD-10-CM

## 2013-07-01 DIAGNOSIS — N186 End stage renal disease: Secondary | ICD-10-CM | POA: Diagnosis not present

## 2013-07-01 LAB — CBC
HCT: 27.7 % — ABNORMAL LOW (ref 39.0–52.0)
Hemoglobin: 9 g/dL — ABNORMAL LOW (ref 13.0–17.0)
MCH: 26.7 pg (ref 26.0–34.0)
MCHC: 32.5 g/dL (ref 30.0–36.0)
MCV: 82.2 fL (ref 78.0–100.0)
Platelets: 161 10*3/uL (ref 150–400)
RBC: 3.37 MIL/uL — ABNORMAL LOW (ref 4.22–5.81)
RDW: 16 % — ABNORMAL HIGH (ref 11.5–15.5)
WBC: 3.3 10*3/uL — ABNORMAL LOW (ref 4.0–10.5)

## 2013-07-01 LAB — OCCULT BLOOD X 1 CARD TO LAB, STOOL: Fecal Occult Bld: NEGATIVE

## 2013-07-01 LAB — AST: AST: 69 U/L — ABNORMAL HIGH (ref 0–37)

## 2013-07-01 LAB — PROTIME-INR
INR: 1.22 (ref 0.00–1.49)
Prothrombin Time: 15.1 seconds (ref 11.6–15.2)

## 2013-07-01 LAB — ALT: ALT: 99 U/L — ABNORMAL HIGH (ref 0–53)

## 2013-07-01 LAB — HEPARIN LEVEL (UNFRACTIONATED): Heparin Unfractionated: 0.37 IU/mL (ref 0.30–0.70)

## 2013-07-01 LAB — TSH: TSH: 1.942 u[IU]/mL (ref 0.350–4.500)

## 2013-07-01 MED ORDER — HYDROCODONE-ACETAMINOPHEN 5-325 MG PO TABS
1.0000 | ORAL_TABLET | Freq: Once | ORAL | Status: AC
  Administered 2013-07-01: 1 via ORAL
  Filled 2013-07-01: qty 1

## 2013-07-01 MED ORDER — WARFARIN SODIUM 10 MG PO TABS
10.0000 mg | ORAL_TABLET | Freq: Once | ORAL | Status: AC
  Administered 2013-07-01: 10 mg via ORAL
  Filled 2013-07-01: qty 1

## 2013-07-01 MED ORDER — HYDROCODONE-ACETAMINOPHEN 5-325 MG PO TABS
1.0000 | ORAL_TABLET | Freq: Four times a day (QID) | ORAL | Status: DC | PRN
  Administered 2013-07-01 – 2013-07-03 (×5): 1 via ORAL
  Filled 2013-07-01 (×5): qty 1

## 2013-07-01 MED ORDER — SODIUM CHLORIDE 0.9 % IV SOLN
125.0000 mg | INTRAVENOUS | Status: DC
  Administered 2013-07-02: 125 mg via INTRAVENOUS
  Filled 2013-07-01 (×2): qty 10

## 2013-07-01 NOTE — Progress Notes (Signed)
Subjective: Patient reports he is feeling well, per telemetry patient had one tachycardic episode of HR ~120 at 9am.  He does continue to have some LUE swelling and pain.  No dizziness or near syncopal episodes. Objective: Vital signs in last 24 hours: Filed Vitals:   06/30/13 1744 06/30/13 2159 07/01/13 0542 07/01/13 0938  BP: 125/87 121/85 137/90 122/80  Pulse: 84 78 91 97  Temp: 98.2 F (36.8 C) 98.6 F (37 C) 97.7 F (36.5 C) 98.6 F (37 C)  TempSrc: Oral Oral Oral Oral  Resp: 21 20 20 20   Height:      Weight:  224 lb 6.9 oz (101.8 kg)    SpO2: 96% 99% 100% 100%   Weight change: -9 lb 11.2 oz (-4.4 kg)  Intake/Output Summary (Last 24 hours) at 07/01/13 1133 Last data filed at 07/01/13 0900  Gross per 24 hour  Intake    574 ml  Output   4351 ml  Net  -3777 ml  Vitals reviewed.  General: resting in bed, NAD  Cardiac: Irregularly irregular  Pulm: clear to auscultation bilaterally Abd: soft, nontender, nondistended, BS present  Ext: warm and well perfused, no pedal edema, +2dp b/l, LAVF + palpable thrill, tender increased swelling of LUE, scars from prior RAVF  Neuro: alert and oriented X3   Lab Results: Basic Metabolic Panel:  Recent Labs Lab 06/29/13 0545 06/30/13 0430  NA 138 132*  K 4.1 3.9  CL 98 94*  CO2 29 24  GLUCOSE 95 90  BUN 26* 33*  CREATININE 6.48* 8.09*  CALCIUM 9.2 9.3  PHOS 3.2 2.4   Liver Function Tests:  Recent Labs Lab 06/29/13 0545 06/30/13 0430 07/01/13 0543  AST 183* 101* 69*  ALT 146* 115* 99*  ALKPHOS 112 109  --   BILITOT 0.6 0.5  --   PROT 7.1 7.1  --   ALBUMIN 3.1*  3.3* 3.0*  3.1*  --    CBC:  Recent Labs Lab 06/28/13 1120  06/30/13 0430 07/01/13 0543  WBC 5.1  < > 3.7* 3.3*  NEUTROABS 3.9  --   --   --   HGB 9.3*  < > 8.5* 9.0*  HCT 28.4*  < > 26.2* 27.7*  MCV 81.1  < > 81.4 82.2  PLT 138*  < > 144* 161  < > = values in this interval not displayed. Cardiac Enzymes:  Recent Labs Lab 06/28/13 1120  06/29/13 0545 06/29/13 1034  TROPONINI <0.30 <0.30 <0.30   Thyroid Function Tests:  Recent Labs Lab 06/30/13 0728  TSH 1.942   Coagulation:  Recent Labs Lab 06/29/13 0012 06/29/13 2040 07/01/13 0543  LABPROT 18.6* 15.5* 15.1  INR 1.60* 1.26 1.22   Anemia Panel:  Recent Labs Lab 06/30/13 0728  FERRITIN 989*   Urine Drug Screen: Drugs of Abuse     Component Value Date/Time   LABOPIA NONE DETECTED 12/11/2007 2233   COCAINSCRNUR NONE DETECTED 12/11/2007 2233   LABBENZ NONE DETECTED 12/11/2007 2233   AMPHETMU NONE DETECTED 12/11/2007 2233   THCU NONE DETECTED 12/11/2007 2233   LABBARB  Value: NONE DETECTED        DRUG SCREEN FOR MEDICAL PURPOSES ONLY.  IF CONFIRMATION IS NEEDED FOR ANY PURPOSE, NOTIFY LAB WITHIN 5 DAYS. 12/11/2007 2233     Micro Results: Recent Results (from the past 240 hour(s))  CULTURE, BLOOD (ROUTINE X 2)     Status: None   Collection Time    06/28/13 12:12 AM  Result Value Range Status   Specimen Description BLOOD RIGHT HAND   Final   Special Requests BOTTLES DRAWN AEROBIC ONLY 10CC   Final   Culture  Setup Time     Final   Value: 06/29/2013 05:55     Performed at [facility]   Culture     Final   Value:        BLOOD CULTURE RECEIVED NO GROWTH TO DATE CULTURE WILL BE HELD FOR 5 DAYS BEFORE ISSUING A FINAL NEGATIVE REPORT     Performed at [facility]   Report Status PENDING   Incomplete  CULTURE, BLOOD (ROUTINE X 2)     Status: None   Collection Time    06/28/13 12:17 AM      Result Value Range Status   Specimen Description BLOOD RIGHT FOREARM   Final   Special Requests BOTTLES DRAWN AEROBIC ONLY [facility]   Final   Culture  Setup Time     Final   Value: 06/29/2013 05:55     Performed at [facility]   Culture     Final   Value:        BLOOD CULTURE RECEIVED NO GROWTH TO DATE CULTURE WILL BE HELD FOR 5 DAYS BEFORE ISSUING A FINAL NEGATIVE REPORT     Performed at [facility]   Report Status PENDING   Incomplete    Studies/Results: No results found. Medications: I have reviewed the patient's current medications. Scheduled Meds: . amLODipine  10 mg Oral Daily  . darbepoetin (ARANESP) injection - DIALYSIS  40 mcg Intravenous Q Tue-HD  . doxercalciferol  1 mcg Intravenous Q T,Th,Sa-HD  . feeding supplement (NEPRO CARB STEADY)  237 mL Oral TID BM  . HYDROcodone-acetaminophen  1 tablet Oral Once  . labetalol  200 mg Oral BID  . multivitamin  1 tablet Oral QHS  . pantoprazole  40 mg Oral Q1200  . sevelamer carbonate  2,400 mg Oral TID WC  . sodium chloride  3 mL Intravenous Q12H  . warfarin  10 mg Oral ONCE-1800  . Warfarin - Pharmacist Dosing Inpatient   Does not apply q1800   Continuous Infusions: . heparin 2,350 Units/hr (07/01/13 0426)   PRN Meds:.sodium chloride, acetaminophen, ondansetron, sodium chloride Assessment/Plan: #Syncope-- no obvious cardiac source per cardiology  -Monitor on telemetry  -Event monitor as outpatient  -Follow up with [name] after discharge  #S/P LUE fistula transposition on 06/27/13 -Patient has increased swelling of LUE and pain. -Norco PRN for pain -Apply ice to area -Monitor H/H and obtain Doppler U/S of LUE to evaluate for Hematoma. -Will notify Vascular/Dr. Darrick Penna of patient's condition. #Possible small pulmonary embolism and Acute/Subacute right DVT-  -Patient's tachycardia and SOB has resolved, currently O2 Sats are 98-100% on RA  - Continue IV heparin per pharmacy and transition to coumadin.  -Supplemental O2 as needed  #Possible chronic systolic congestive heart failure  Patient has a history of CHF on his chart. However last echo in August had an EF of 50-55% which is only slightly below normal, patient does not clinically appear to be in acute decompensated heart failure, and his SOB is like more likely due to PE.  #Atrial fibrillation--CHADS2VASC score of 1  -Patient on Heparin and transitioning to Coumadin for PE/DVT. INR 1.22 this  AM. -Continue labetalol  #Atypical Chest Pain--resolved. TIMI risk score of 0, cardiac enzymes negative. TWI in V4-6 on EKG that were present before as well.  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 Follows with Dr. Arrie Aran.  -HD tomorrow  -Appreciate renal following  -Renal diet  -Trend renal function  #Anemia--Hgb 9.3 on admission, Appears to be at baseline likely due to ESRD. -Will continue to monitor for signs of bleeding at LUE fistula site.  Hgb  9.0 this AM will repeat H/H this afternoon. -Aranesp  #HYPERTENSION  -Continue home labetalol and amlodipine  #Prolonged QT--qtc 530 on EKG 06/28/13  -Avoid QT prolonging agents  #DVTPPx: Heparin drip  Dispo: Disposition is deferred at this time, patient is ESRD and needs to be therapeutic on coumadin prior to discharge.  The patient does have a current PCP Irena Cords, MD) and does not need an [facility] hospital follow-up appointment after discharge.  The patient does not have transportation limitations that hinder transportation to clinic appointments.   .Services Needed at time of discharge: Y = Yes, Blank = No PT:   OT:   RN:   Equipment:   Other:     LOS: 3 days   Carlynn Purl, DO 07/01/2013, 11:33 AM

## 2013-07-01 NOTE — Consult Note (Signed)
[facility] [place]  Patient name: Trevor [name] MRN: [id number] DOB: 1950-12-05 Sex: male  REASON FOR CONSULT: Left arm swelling. Status post left upper arm AV fistula.  HPI: Trevor [name] is a 62 y.o. male with end-stage renal disease who underwent placement of a left basilic vein transposition by Dr. Darrick Penna on 06/27/2013. He was admitted on 06/28/2013 with a syncopal episode. He is being worked up by cardiology. Patient has a possible small pulmonary embolus ICP and also a right lower extremity DVT. He is on IV heparin plans to transitioned to Coumadin. He has had increased swelling in his left arm and the graft to evaluate this.  He notes some swelling in the left upper extremity but no significant pain.  Past Medical History  Diagnosis Date  . Anemia   . AVF (arteriovenous fistula)     Left  . Secondary hyperparathyroidism   . Hypovitaminosis D   . Hypertensive urgency     H/o  . CHF (congestive heart failure)   . Exertional shortness of breath     "related to infection in my lungs right now" (05/03/2013)  . History of gout     "before I started doing the dialysis" (05/03/2013)  . ESRD (end stage renal disease) on dialysis     "[facility]; MWF" (05/03/2013)  . Dysrhythmia     Hx: aflutter  . Sleep apnea   . GERD (gastroesophageal reflux disease)   . Headache(784.0)    Family History  Problem Relation Age of Onset  . Hypertension Father   . Kidney disease Father   . Allergies Father   . Deep vein thrombosis Sister   . Pulmonary embolism Sister    SOCIAL HISTORY: History  Substance Use Topics  . Smoking status: Former Smoker -- 0.25 packs/day for .5 years    Types: Cigarettes    Quit date: 09/08/1972  . Smokeless tobacco: Never Used  . Alcohol Use: Yes     Comment: 05/03/2013 "haven't had a glass of wine in ~ 3 months or so; sometimes will have one w/dinner"   No Known Allergies  Current Facility-Administered Medications  Medication Dose  Route Frequency Provider Last Rate Last Dose  . 0.9 %  sodium chloride infusion  250 mL Intravenous PRN Lorretta Harp, MD 10 mL/hr at 06/28/13 2149 250 mL at 06/28/13 2149  . acetaminophen (TYLENOL) tablet 650 mg  650 mg Oral Q6H PRN Otis Brace, MD   650 mg at 06/30/13 1108  . amLODipine (NORVASC) tablet 10 mg  10 mg Oral Daily Lorretta Harp, MD   10 mg at 07/01/13 1226  . darbepoetin (ARANESP) injection 40 mcg  40 mcg Intravenous Q Tue-HD Kerin Salen, PA-C      . doxercalciferol ([name]) injection 1 mcg  1 mcg Intravenous Q T,Th,Sa-HD Kerin Salen, PA-C   1 mcg at 06/30/13 4098  . feeding supplement (NEPRO CARB STEADY) liquid 237 mL  237 mL Oral TID BM Maree Krabbe, MD   237 mL at 07/01/13 1227  . [START ON 07/02/2013] ferric gluconate (NULECIT) 125 mg in sodium chloride 0.9 % 100 mL IVPB  125 mg Intravenous Q T,Th,Sa-HD Kerin Salen, PA-C      . heparin ADULT infusion 100 units/mL (25000 units/250 mL)  2,350 Units/hr Intravenous Continuous Ann Held, RPH 23.5 mL/hr at 07/01/13 1518 2,350 Units/hr at 07/01/13 1518  . HYDROcodone-acetaminophen (NORCO/VICODIN) 5-325 MG per tablet 1 tablet  1 tablet Oral Q6H PRN Clydie Braun  E Warren, PA-C      . labetalol (NORMODYNE) tablet 200 mg  200 mg Oral BID Lorretta Harp, MD   200 mg at 07/01/13 1226  . multivitamin (RENA-VIT) tablet 1 tablet  1 tablet Oral QHS Sheffield Slider, PA-C      . ondansetron [facility]) injection 4 mg  4 mg Intravenous Q8H PRN Lorretta Harp, MD      . pantoprazole (PROTONIX) EC tablet 40 mg  40 mg Oral Q1200 Lorretta Harp, MD   40 mg at 07/01/13 1225  . sevelamer carbonate (RENVELA) tablet 2,400 mg  2,400 mg Oral TID WC Kerin Salen, PA-C   2,400 mg at 07/01/13 1218  . sodium chloride 0.9 % injection 3 mL  3 mL Intravenous Q12H Lorretta Harp, MD   3 mL at 06/28/13 2230  . sodium chloride 0.9 % injection 3 mL  3 mL Intravenous PRN Lorretta Harp, MD      . warfarin (COUMADIN) tablet 10 mg  10 mg Oral ONCE-1800 Dannielle Karvonen [place], [facility]       . Warfarin - Pharmacist Dosing Inpatient   Does not apply q1800 Jonah Blue, DO       REVIEW OF SYSTEMS: Arly.Keller ] denotes positive finding; [  ] denotes negative finding CARDIOVASCULAR:  [ ]  chest pain   [ ]  chest pressure   [ ]  palpitations   [ ]  orthopnea   [ ]  dyspnea on exertion   [ ]  claudication   [ ]  rest pain   [ ]  DVT   [ ]  phlebitis PULMONARY:   [ ]  productive cough   [ ]  asthma   [ ]  wheezing INTEGUMENTARY:  [ ]  rashes  [ ]  ulcers CONSTITUTIONAL:  [ ]  fever   [ ]  chills  PHYSICAL EXAM: Filed Vitals:   06/30/13 2159 07/01/13 0542 07/01/13 0938 07/01/13 1223  BP: 121/85 137/90 122/80 148/84  Pulse: 78 91 97 86  Temp: 98.6 F (37 C) 97.7 F (36.5 C) 98.6 F (37 C)   TempSrc: Oral Oral Oral   Resp: 20 20 20    Height:      Weight: 224 lb 6.9 oz (101.8 kg)     SpO2: 99% 100% 100%    Body mass index is 29.21 kg/(m^2). GENERAL: The patient is a well-nourished male, in no acute distress. The vital signs are documented above. CARDIOVASCULAR: There is a regular rate and rhythm. PULMONARY: There is good air exchange bilaterally without wheezing or rales. His left upper arm fistula has a good thrill. The left hand is warm and well-perfused. There is some mild swelling in the upper arm and moderate swelling of the antecubital incision. There is no compromise of the skin.  DATA:  Lab Results  Component Value Date   WBC 3.3* 07/01/2013   HGB 9.5* 07/01/2013   HCT 29.5* 07/01/2013   MCV 82.2 07/01/2013   PLT 161 07/01/2013   Lab Results  Component Value Date   NA 132* 06/30/2013   K 3.9 06/30/2013   CL 94* 06/30/2013   CO2 24 06/30/2013   Lab Results  Component Value Date   CREATININE 8.09* 06/30/2013   Lab Results  Component Value Date   INR 1.22 07/01/2013   INR 1.26 06/29/2013   INR 1.60* 06/29/2013   Lab Results  Component Value Date   HGBA1C  Value: 5.7 (NOTE)   The ADA recommends the following therapeutic goals for glycemic   control related to Hgb A1C  measurement:  Goal of Therapy:   < 7.0% Hgb A1C   Action Suggested:  > 8.0% Hgb A1C   Ref:  Diabetes Care, 22, Suppl. 1, 1999 12/11/2007   Venous duplex scan (06/29/2013): Acute or subacute DVT in the right posterior tibial vein.  CT of the chest: small filling defect in the lower lobe pulmonary arterial branch which may represent a small pulmonary embolus.  MEDICAL ISSUES: Some mild hematoma at the antecubital incision. I'll keep an eye on this. Would only consider exploration if this progressed significantly. It would be ideal to stop his anticoagulations but he needs this for a possible small pulmonary embolus and calf vein DVT. We will follow.  DICKSON,CHRISTOPHER S [facility] [place] Beeper: [phone]

## 2013-07-01 NOTE — Progress Notes (Signed)
ANTICOAGULATION CONSULT NOTE - Follow Up Consult  Pharmacy Consult for Heparin + Warfarin Indication: Confirmed RLE DVT and possible small PE  No Known Allergies  Patient Measurements: Height: 6' 1.5" (186.7 cm) Weight: 224 lb 6.9 oz (101.8 kg) IBW/kg (Calculated) : 81.05 Heparin Dosing Weight: 101.2 kg  Vital Signs: Temp: 98.6 F (37 C) (10/24 0938) Temp src: Oral (10/24 0938) BP: 122/80 mmHg (10/24 0938) Pulse Rate: 97 (10/24 0938)  Labs:  Recent Labs  06/29/13 0012  06/29/13 0545  06/29/13 1034 06/29/13 2040 06/30/13 0430 07/01/13 0543  HGB  --   < > 9.1*  --   --   --  8.5* 9.0*  HCT  --   --  27.4*  --   --   --  26.2* 27.7*  PLT  --   --  135*  --   --   --  144* 161  LABPROT 18.6*  --   --   --   --  15.5*  --  15.1  INR 1.60*  --   --   --   --  1.26  --  1.22  HEPARINUNFRC 0.18*  --   --   < >  --  0.40 0.31 0.37  CREATININE  --   --  6.48*  --   --   --  8.09*  --   TROPONINI  --   --  <0.30  --  <0.30  --   --   --   < > = values in this interval not displayed.  Estimated Creatinine Clearance: 12.3 ml/min (by C-G formula based on Cr of 8.09).   Medications:  Heparin @ 2350 units/hr  Assessment: 62 y.o. M who continues on heparin + warfarin for RLE DVT and possible PE (via CT angio on 10/21). VTE overlap D#3/5 as recommended per CHEST guidelines however best practice is to continue until INR >2 x 24 hours. Heparin level this morning is therapeutic (HL 0.37, goal of 0.3-0.7). Noted recent VVS surgery for first stage of LUE AVF formation.    The patient's INR this morning remains SUBtherapeutic (INR 1.22, goal of 2-3).  - H/H and Plts improving - AST/ALT 69/69 (improving)   Goal of Therapy:  INR 2-3 Heparin level 0.3-0.7 units/ml Monitor platelets by anticoagulation protocol: Yes   Plan:  1. Cont. heparin drip rate to 2350 units/hr (23.5 ml/hr) 2. Warfarin 10 mg x 1 dose at 1800 today 3. Will continue to monitor for any signs/symptoms of bleeding  and will follow up with heparin level and PT/INR in the a.m.   Thanks, Pharmacy Dina Rich, PharmD Candidate Pager [phone]  07/01/2013 11:22 AM     I have reviewed and agree with above note.  Wilfred Lacy, PharmD Clinical Pharmacist [phone] 07/01/2013, 11:37 AM

## 2013-07-01 NOTE — Progress Notes (Signed)
    Subjective:  Left arm hurts. Otherwise no syncope, no SHOB, no CP.   Objective:  Vital Signs in the last 24 hours: Temp:  [97.7 F (36.5 C)-98.6 F (37 C)] 98.6 F (37 C) (10/24 5621) Pulse Rate:  [78-97] 86 (10/24 1223) Resp:  [20-21] 20 (10/24 0938) BP: (121-148)/(80-90) 148/84 mmHg (10/24 1223) SpO2:  [96 %-100 %] 100 % (10/24 0938) Weight:  [224 lb 6.9 oz (101.8 kg)] 224 lb 6.9 oz (101.8 kg) (10/23 2159)  Intake/Output from previous day: 10/23 0701 - 10/24 0700 In: 334 [P.O.:240; I.V.:94] Out: 4151 [Urine:151]   Physical Exam: General: Well developed, well nourished, in no acute distress. Head:  Normocephalic and atraumatic. Lungs: Clear to auscultation and percussion. Heart: Normal S1 and S2.  No murmur, rubs or gallops.  Abdomen: soft, non-tender, positive bowel sounds. Extremities: Left arm edema noted. Neurologic: Alert and oriented x 3.    Lab Results:  Recent Labs  06/30/13 0430 07/01/13 0543  WBC 3.7* 3.3*  HGB 8.5* 9.0*  PLT 144* 161    Recent Labs  06/29/13 0545 06/30/13 0430  NA 138 132*  K 4.1 3.9  CL 98 94*  CO2 29 24  GLUCOSE 95 90  BUN 26* 33*  CREATININE 6.48* 8.09*    Recent Labs  06/29/13 0545 06/29/13 1034  TROPONINI <0.30 <0.30   Hepatic Function Panel  Recent Labs  06/30/13 0430 07/01/13 0543  PROT 7.1  --   ALBUMIN 3.0*  3.1*  --   AST 101* 69*  ALT 115* 99*  ALKPHOS 109  --   BILITOT 0.5  --   BILIDIR 0.2  --   IBILI 0.3  --      Telemetry: AFIB with rate control. No pauses.  Personally viewed.  Cardiac Studies:  ECHO 04/29/13: EF 50-55% Mild LVH PASP Moderate LA enlargement   Assessment/Plan:  Syncope - no adverse rhythms on monitor. ?post conversion AFIB pause as cause?Marland Kitchen Upon DC, event monitor. 5+ syncope episodes. Small PE should not cause syncope?   HYPERTENSION - meds reviewed no changes    Atrial fibrillation -  Rate controled. Labetalol.   ESRD (end stage renal disease) on  dialysis  CHF (congestive heart failure) - diastolic HF. Fluid mgt by dialysis.   Anemia - Hg 9.1  DVT of lower extremity (deep venous thrombosis) RLE - coumadin. Subtx INR.      Trevor [name] 07/01/2013, 2:08 PM

## 2013-07-01 NOTE — Progress Notes (Signed)
[facility] Progress Note  Subjective:   C/o fatigue and left arm pain and swelling, No further sycope  Objective Filed Vitals:   06/30/13 1744 06/30/13 2159 07/01/13 0542 07/01/13 0938  BP: 125/87 121/85 137/90 122/80  Pulse: 84 78 91 97  Temp: 98.2 F (36.8 C) 98.6 F (37 C) 97.7 F (36.5 C) 98.6 F (37 C)  TempSrc: Oral Oral Oral Oral  Resp: 21 20 20 20   Height:      Weight:  101.8 kg (224 lb 6.9 oz)    SpO2: 96% 99% 100% 100%   Physical Exam General: Alert, cooperative, NAD Heart: RRR Lungs: CTA bilat, no wheezes, rales or rhonchi Abdomen: Soft, NT, non distended, + BS Extremities: No LE edema Dialysis Access: Rt I-J PC, Maturing L BVT placed 10/20 - LUE swelling, incisions clean/dry/ with dermabond. + bruit  Dialysis Orders: Center: AF on TTS .  EDW 101.5 kg HD Bath 2K/2.25 Ca Time 4:30 Heparin 3200. Access Rt I-J PC/ Maturing L BVT placed 10/20 by Fields BFR 400 DFR 800  [name] 1 mcg IV/HD Epogen 4800 Units IV/HD Venofer 0, Profile 2  Recent labs: Hgb 9.7, P 3.4, Tsat 17%, Ferritin >5000 in Sept, PTH 100   Assessment/Plan: 1. Syncope - Known atrial flutter. Trop neg x 1. POD 4 from placement of left BVT. CT angio cannot exclude small pulmonary embolus. See #9. Blood cultures negative to date. Cards planning event monitor after d/c. 2. HD access - HD via PC. New L BVT with LUA pain and swelling. Elevation, pain mgmt, monitor closely. 3. ESRD - TTS, K + 3.9 yesterday. Next HD tomorrow. First shift. No heparin (on gtt). Recheck K+ 4. Hypertension/volume - SBPs 120s on amlodipine. Small right pleural effusion by CT. Currently at EDW. UF as tolerated. 5. Anemia - Hgb 9.0 > 8.5. Continue Aranesp 40 q tues. Last Tsat 17%. Ferritin 989 this admit. FOBT negative. Start IV Fe load x 8 doses. 6. Metabolic bone disease - Ca 9.3 (10 corrected). Phos 2.4 on Renvela 3 ac (lowered this admit). Continue Vit D. Renal panel  7. Nutrition - Renal diet, multivitamin,  nepro 8. Elevated LFTs - New since last checked in August; also noted to have a ferritin >5000 when last checked at dialysis. Repeat ferritin 989 and LFTs trending down this admit. Hepb B s Ag neg and Hep C neg in August. Follow for now. 9. Thrombocytopenia -  Improving.  10. Acute to subacute DVT in right lower leg - anticoag per pharmacy. INR 1.22 today  Scot Jun. Broadus John, PA-C [facility] Pager [phone] 07/01/2013,12:15 PM  LOS: 3 days   Patient seen and examined.  Agree with assessment and plan as above. L arm still painful and swollen after BVT surgery on Monday 10/20.  No signs of steal, oozing some blood per pt from one of the inicisions.  Have called VVS to evaluate arm. Stable volume, HD tomorrow.   [name]  MD Pager [phone]    Cell  [phone] 07/01/2013, 2:57 PM     Additional Objective Labs: Basic Metabolic Panel:  Recent Labs Lab 06/28/13 1120 06/29/13 0545 06/30/13 0430  NA 134* 138 132*  K 5.0 4.1 3.9  CL 93* 98 94*  CO2 26 29 24   GLUCOSE 119* 95 90  BUN 50* 26* 33*  CREATININE 10.53* 6.48* 8.09*  CALCIUM 9.5 9.2 9.3  PHOS  --  3.2 2.4   Liver Function Tests:  Recent Labs Lab 06/29/13 0545 06/30/13  0430 07/01/13 0543  AST 183* 101* 69*  ALT 146* 115* 99*  ALKPHOS 112 109  --   BILITOT 0.6 0.5  --   PROT 7.1 7.1  --   ALBUMIN 3.1*  3.3* 3.0*  3.1*  --    CBC:  Recent Labs Lab 06/28/13 1120 06/29/13 0545 06/30/13 0430 07/01/13 0543  WBC 5.1 3.8* 3.7* 3.3*  NEUTROABS 3.9  --   --   --   HGB 9.3* 9.1* 8.5* 9.0*  HCT 28.4* 27.4* 26.2* 27.7*  MCV 81.1 80.4 81.4 82.2  PLT 138* 135* 144* 161   Blood Culture    Component Value Date/Time   SDES BLOOD RIGHT FOREARM 06/28/2013 0017   SPECREQUEST BOTTLES DRAWN AEROBIC ONLY 7CC 06/28/2013 0017   CULT  Value:        BLOOD CULTURE RECEIVED NO GROWTH TO DATE CULTURE WILL BE HELD FOR 5 DAYS BEFORE ISSUING A FINAL NEGATIVE REPORT Performed at [facility] 06/28/2013  0017   REPTSTATUS PENDING 06/28/2013 0017    Cardiac Enzymes:  Recent Labs Lab 06/28/13 0012 06/28/13 1120 06/29/13 0545 06/29/13 1034  TROPONINI <0.30 <0.30 <0.30 <0.30   Iron Studies:  Recent Labs  06/30/13 0728  FERRITIN 989*    Medications: . heparin 2,350 Units/hr (07/01/13 0426)   . amLODipine  10 mg Oral Daily  . darbepoetin (ARANESP) injection - DIALYSIS  40 mcg Intravenous Q Tue-HD  . doxercalciferol  1 mcg Intravenous Q T,Th,Sa-HD  . feeding supplement (NEPRO CARB STEADY)  237 mL Oral TID BM  . HYDROcodone-acetaminophen  1 tablet Oral Once  . labetalol  200 mg Oral BID  . multivitamin  1 tablet Oral QHS  . pantoprazole  40 mg Oral Q1200  . sevelamer carbonate  2,400 mg Oral TID WC  . sodium chloride  3 mL Intravenous Q12H  . warfarin  10 mg Oral ONCE-1800  . Warfarin - Pharmacist Dosing Inpatient   Does not apply [phone]

## 2013-07-01 NOTE — Progress Notes (Signed)
VASCULAR LAB PRELIMINARY  PRELIMINARY  PRELIMINARY  PRELIMINARY  Left upper extremity duplex of A/V fistula for hematoma completed.    Preliminary report:  A/V fistula is patent.  No active bleeding noted.  There is an irregular shaped hematoma along the incision site.   Toinette Lackie, RVT 07/01/2013, 4:57 PM

## 2013-07-01 NOTE — Progress Notes (Signed)
  Date: 07/01/2013  Patient name: Trevor [name]  Medical record number: [id number]  Date of birth: [birth year]   This patient has been seen and the plan of care was discussed with the house staff. Please see their note for complete details. I concur with their findings with the following additions/corrections: Appreciate cardiology input. Will need a 30 days continuous monitor vs loop recorder.  The patient had multiple episodes of syncope (5+) and CTA of the chest had motion artifact within limited images.  LE doppler did have evidence of DVT. Tx in this case is anticoagulation and presume PE (whether this caused his syncopal episodes or not I'm not sure, but can contribute). Cardiac etiology still needs to be ruled out. Pt with Afib/Aflutter. He has increased tenderness and mild echymosis in LUE fistula site. Ask vascular surgery to see him. Obtain doppler of LUE.  Repeat H/H this afternoon, if dropping, will need to consider bleeding into fistula site and stopping heparin given only findings of small PE.  This will be a difficult scenario.  Jonah Blue, DO, FACP [facility] 07/01/2013, 1:05 PM

## 2013-07-02 DIAGNOSIS — D631 Anemia in chronic kidney disease: Secondary | ICD-10-CM | POA: Diagnosis not present

## 2013-07-02 DIAGNOSIS — N186 End stage renal disease: Secondary | ICD-10-CM | POA: Diagnosis not present

## 2013-07-02 DIAGNOSIS — I4891 Unspecified atrial fibrillation: Secondary | ICD-10-CM | POA: Diagnosis not present

## 2013-07-02 DIAGNOSIS — R55 Syncope and collapse: Secondary | ICD-10-CM | POA: Diagnosis not present

## 2013-07-02 LAB — CBC
HCT: 27.4 % — ABNORMAL LOW (ref 39.0–52.0)
Hemoglobin: 9 g/dL — ABNORMAL LOW (ref 13.0–17.0)
MCV: 81.5 fL (ref 78.0–100.0)
RBC: 3.36 MIL/uL — ABNORMAL LOW (ref 4.22–5.81)
WBC: 4.4 10*3/uL (ref 4.0–10.5)

## 2013-07-02 LAB — HEPARIN LEVEL (UNFRACTIONATED): Heparin Unfractionated: 0.42 IU/mL (ref 0.30–0.70)

## 2013-07-02 LAB — RENAL FUNCTION PANEL
CO2: 25 mEq/L (ref 19–32)
Chloride: 96 mEq/L (ref 96–112)
GFR calc Af Amer: 8 mL/min — ABNORMAL LOW (ref >90)
Glucose, Bld: 85 mg/dL (ref 70–99)
Potassium: 4.6 mEq/L (ref 3.5–5.1)
Sodium: 133 mEq/L — ABNORMAL LOW (ref 135–145)

## 2013-07-02 MED ORDER — DOXERCALCIFEROL 4 MCG/2ML IV SOLN
INTRAVENOUS | Status: AC
  Administered 2013-07-02: 1 ug via INTRAVENOUS
  Filled 2013-07-02: qty 2

## 2013-07-02 MED ORDER — HYDROCODONE-ACETAMINOPHEN 5-325 MG PO TABS
ORAL_TABLET | ORAL | Status: AC
  Administered 2013-07-02: 1 via ORAL
  Filled 2013-07-02: qty 1

## 2013-07-02 MED ORDER — WARFARIN SODIUM 10 MG PO TABS
10.0000 mg | ORAL_TABLET | Freq: Once | ORAL | Status: AC
  Administered 2013-07-02: 10 mg via ORAL
  Filled 2013-07-02: qty 1

## 2013-07-02 NOTE — Progress Notes (Signed)
[facility] Progress Note  Subjective:   No SOB no dizziness; feels like left arm is a little better  Objective Filed Vitals:   07/02/13 0545 07/02/13 0700 07/02/13 0707 07/02/13 0730  BP: 127/78 151/65 147/101 139/99  Pulse: 94 92 92 83  Temp: 98.6 F (37 C) 98.6 F (37 C)    TempSrc: Oral Oral    Resp: 19 16 18 24   Height:      Weight:  104.6 kg (230 lb 9.6 oz)    SpO2: 97% 100%     Physical Exam on HD General: NAD Heart: RRR Lungs: no wheezes or rales Abdomen: soft Extremities: tr LE edema; left arm mild swelling Dialysis Access:  maturingleft upper AVF patent - right I-J  Dialysis Orders: AF on TTS .  4.5hrs    101.5kg  2K/2.25Ca   Heparin     Rt I-J PC    Profile 2  [name] 1 mcg IV/HD    Epogen 4800 Units IV/HD     Venofer 0 Maturing L BVT placed 10/20 by Dr Darrick Penna Labs: Hgb 9.7, P 3.4, Tsat 17%, Ferritin >5000 in Sept, PTH 100   Assessment/Plan: 1. Syncope - Known atrial flutter. Trop neg x 1. POD 4 from placement of left BVT. CT angio cannot exclude small pulmonary embolus. See #9. Blood cultures negative to date. Cards planning event monitor after d/c 2. Acute/subacute DVT in right post tibial vein / possible small PE by CT- anticoag per pharmacy- INR slowing increasing 3. HD access - HD via PC. New L BVT with LUA pain and swelling = hematoma; unable to d/c anticoag due to DVT and ? PE; VVS has evaluated and will follow 4. ESRD - TTS,  HD to No heparin (on gtt). K 4.6 5. Hypertension/volume - controlled on amlodipine. Small right pleural effusion by CT. Currently at EDW. UF as tolerated. 6. Anemia - Hgb 9.0  stable Continue Aranesp 40 q tues. Last Tsat 17%. Ferritin 989 this admit. FOBT negative. Start IV Fe load x 8 doses. 7. Metabolic bone disease - Ca 9.9 (10.4 corrected). Phos 3.1 on Renvela 3 ac (lowered this admit). Continue Vit D. 8. Nutrition - Renal diet, multivitamin, nepro 9. Elevated LFTs - New since last checked in August; also noted to  have a ferritin >5000 when last checked at dialysis. Repeat ferritin 989 and LFTs trending down this admit. Hepb B s Ag neg and Hep C neg in August. Follow for now. 10. Thrombocytopenia - resolved - in normal range  Sheffield Slider, PA-C [facility] Beeper [phone] 07/02/2013,8:15 AM  LOS: 4 days   Patient seen and examined.  Agree with assessment and plan as above. [name]  MD Pager [phone]    Cell  [phone] 07/02/2013, 8:37 AM   Basic Metabolic Panel: Lab Results  Component Value Date   INR 1.36 07/02/2013   INR 1.22 07/01/2013   INR 1.26 06/29/2013    Recent Labs Lab 06/29/13 0545 06/30/13 0430 07/02/13 0700  NA 138 132* 133*  K 4.1 3.9 4.6  CL 98 94* 96  CO2 29 24 25   GLUCOSE 95 90 85  BUN 26* 33* 30*  CREATININE 6.48* 8.09* 7.97*  CALCIUM 9.2 9.3 9.9  PHOS 3.2 2.4 3.1   Liver Function Tests:  Recent Labs Lab 06/29/13 0545 06/30/13 0430 07/01/13 0543 07/02/13 0700  AST 183* 101* 69*  --   ALT 146* 115* 99*  --   ALKPHOS 112 109  --   --  BILITOT 0.6 0.5  --   --   PROT 7.1 7.1  --   --   ALBUMIN 3.1*  3.3* 3.0*  3.1*  --  3.3*   CBC:  Recent Labs Lab 06/28/13 1120 06/29/13 0545 06/30/13 0430 07/01/13 0543 07/01/13 1436 07/02/13 0550  WBC 5.1 3.8* 3.7* 3.3*  --  4.4  NEUTROABS 3.9  --   --   --   --   --   HGB 9.3* 9.1* 8.5* 9.0* 9.5* 9.0*  HCT 28.4* 27.4* 26.2* 27.7* 29.5* 27.4*  MCV 81.1 80.4 81.4 82.2  --  81.5  PLT 138* 135* 144* 161  --  160   Blood Culture    Component Value Date/Time   SDES BLOOD RIGHT FOREARM 06/28/2013 0017   SPECREQUEST BOTTLES DRAWN AEROBIC ONLY 7CC 06/28/2013 0017   CULT  Value:        BLOOD CULTURE RECEIVED NO GROWTH TO DATE CULTURE WILL BE HELD FOR 5 DAYS BEFORE ISSUING A FINAL NEGATIVE REPORT Performed at [facility] 06/28/2013 0017   REPTSTATUS PENDING 06/28/2013 0017    Cardiac Enzymes:  Recent Labs Lab 06/28/13 0012 06/28/13 1120 06/29/13 0545  06/29/13 1034  TROPONINI <0.30 <0.30 <0.30 <0.30  Iron Studies:   Recent Labs  06/30/13 0728  FERRITIN 989*  Medications: . heparin 2,350 Units/hr (07/02/13 0238)   . amLODipine  10 mg Oral Daily  . darbepoetin (ARANESP) injection - DIALYSIS  40 mcg Intravenous Q Tue-HD  . doxercalciferol  1 mcg Intravenous Q T,Th,Sa-HD  . feeding supplement (NEPRO CARB STEADY)  237 mL Oral TID BM  . ferric gluconate (FERRLECIT/NULECIT) IV  125 mg Intravenous Q T,Th,Sa-HD  . labetalol  200 mg Oral BID  . multivitamin  1 tablet Oral QHS  . pantoprazole  40 mg Oral Q1200  . sevelamer carbonate  2,400 mg Oral TID WC  . sodium chloride  3 mL Intravenous Q12H  . Warfarin - Pharmacist Dosing Inpatient   Does not apply [phone]

## 2013-07-02 NOTE — Procedures (Signed)
I was present at this dialysis session. I have reviewed the session itself and made appropriate changes.   Rob Arriana Lohmann  MD Pager [phone]    Cell  [phone] 07/02/2013, 8:19 AM    

## 2013-07-02 NOTE — Progress Notes (Signed)
ANTICOAGULATION CONSULT NOTE - Follow Up Consult  Pharmacy Consult for Heparin and Coumadin Indication: Confirmed RLE DVT and possible small PE  No Known Allergies  Patient Measurements: Height: 6' 1.5" (186.7 cm) Weight: 225 lb 5 oz (102.2 kg) IBW/kg (Calculated) : 81.05 Heparin Dosing Weight: 102 kg  Vital Signs: Temp: 98.7 F (37.1 C) (10/25 1225) Temp src: Oral (10/25 1225) BP: 128/89 mmHg (10/25 1225) Pulse Rate: 100 (10/25 1225)  Labs:  Recent Labs  06/29/13 2040  06/30/13 0430 07/01/13 0543 07/01/13 1436 07/02/13 0550 07/02/13 0700  HGB  --   < > 8.5* 9.0* 9.5* 9.0*  --   HCT  --   < > 26.2* 27.7* 29.5* 27.4*  --   PLT  --   --  144* 161  --  160  --   LABPROT 15.5*  --   --  15.1  --  16.4*  --   INR 1.26  --   --  1.22  --  1.36  --   HEPARINUNFRC 0.40  --  0.31 0.37  --  0.42  --   CREATININE  --   --  8.09*  --   --   --  7.97*  < > = values in this interval not displayed.  Estimated Creatinine Clearance: 12.5 ml/min (by C-G formula based on Cr of 7.97).  Assessment: 62 y.o. male on day # 4 Heparin and Coumadin for RLE DVT and possible PE (via CT angio on 10/21). Heparin remains therapeutic (0.43) on 2350 units/hr.  INR slow to increase. Has had 10 mg, 5 mg, then 10 mg Coumadin.    Recent surgery (POD #5) for first stage of LUE AVF formation. Small hematoma at antecubital fossa noted stable per Dr. Edilia Bo.  Unable to stop anticoagulants with DVT and ? small PE.  Goal of Therapy:  INR 2-3 Heparin level 0.3-0.7 units/ml Monitor platelets by anticoagulation protocol: Yes   Plan:   Continue heparin drip rate at 2350 units/hr.  Repeat Coumadin 10 mg today.  Daily heparin level, CBC and PT/INR.  Will follow up status of left arm hematoma.  Expect heparin to continue until INR > 2 x 2 days.  Dennie Fetters, [place] Pager: [phone] 07/02/2013, 4:37 PM

## 2013-07-02 NOTE — Progress Notes (Signed)
Subjective: Patient seen at HD today, no acute complaints, reports LUE pain a little better than yesterday. Objective: Vital signs in last 24 hours: Filed Vitals:   07/02/13 1100 07/02/13 1130 07/02/13 1138 07/02/13 1225  BP: 143/97 133/96 151/65 128/89  Pulse: 92 98 90 100  Temp:   98.7 F (37.1 C) 98.7 F (37.1 C)  TempSrc:   Oral Oral  Resp: 22 22 20    Height:      Weight:   225 lb 5 oz (102.2 kg)   SpO2:   95% 100%   Weight change: 6 lb 2.8 oz (2.8 kg)  Intake/Output Summary (Last 24 hours) at 07/02/13 1323 Last data filed at 07/02/13 1138  Gross per 24 hour  Intake      0 ml  Output   3100 ml  Net  -3100 ml   Vitals reviewed.  General: resting in bed, NAD  Cardiac: Irregularly irregular  Pulm: clear to auscultation bilaterally  Abd: soft, nontender, nondistended, BS present  Ext: LAVF + palpable thrill, less tender today, swelling unchanged Neuro: alert and oriented X3  Lab Results: Basic Metabolic Panel:  Recent Labs Lab 06/30/13 0430 07/02/13 0700  NA 132* 133*  K 3.9 4.6  CL 94* 96  CO2 24 25  GLUCOSE 90 85  BUN 33* 30*  CREATININE 8.09* 7.97*  CALCIUM 9.3 9.9  PHOS 2.4 3.1   Liver Function Tests:  Recent Labs Lab 06/29/13 0545 06/30/13 0430 07/01/13 0543 07/02/13 0700  AST 183* 101* 69*  --   ALT 146* 115* 99*  --   ALKPHOS 112 109  --   --   BILITOT 0.6 0.5  --   --   PROT 7.1 7.1  --   --   ALBUMIN 3.1*  3.3* 3.0*  3.1*  --  3.3*   CBC:  Recent Labs Lab 06/28/13 1120  07/01/13 0543 07/01/13 1436 07/02/13 0550  WBC 5.1  < > 3.3*  --  4.4  NEUTROABS 3.9  --   --   --   --   HGB 9.3*  < > 9.0* 9.5* 9.0*  HCT 28.4*  < > 27.7* 29.5* 27.4*  MCV 81.1  < > 82.2  --  81.5  PLT 138*  < > 161  --  160  < > = values in this interval not displayed. Cardiac Enzymes:  Recent Labs Lab 06/28/13 1120 06/29/13 0545 06/29/13 1034  TROPONINI <0.30 <0.30 <0.30   Thyroid Function Tests:  Recent Labs Lab 06/30/13 0728  TSH 1.942     Coagulation:  Recent Labs Lab 06/29/13 0012 06/29/13 2040 07/01/13 0543 07/02/13 0550  LABPROT 18.6* 15.5* 15.1 16.4*  INR 1.60* 1.26 1.22 1.36   Anemia Panel:  Recent Labs Lab 06/30/13 0728  FERRITIN 989*   Urine Drug Screen: Drugs of Abuse     Component Value Date/Time   LABOPIA NONE DETECTED 12/11/2007 2233   COCAINSCRNUR NONE DETECTED 12/11/2007 2233   LABBENZ NONE DETECTED 12/11/2007 2233   AMPHETMU NONE DETECTED 12/11/2007 2233   THCU NONE DETECTED 12/11/2007 2233   LABBARB  Value: NONE DETECTED        DRUG SCREEN FOR MEDICAL PURPOSES ONLY.  IF CONFIRMATION IS NEEDED FOR ANY PURPOSE, NOTIFY LAB WITHIN 5 DAYS. 12/11/2007 2233     Micro Results: Recent Results (from the past 240 hour(s))  CULTURE, BLOOD (ROUTINE X 2)     Status: None   Collection Time    06/28/13 12:12 AM  Result Value Range Status   Specimen Description BLOOD RIGHT HAND   Final   Special Requests BOTTLES DRAWN AEROBIC ONLY 10CC   Final   Culture  Setup Time     Final   Value: 06/29/2013 05:55     Performed at [facility]   Culture     Final   Value:        BLOOD CULTURE RECEIVED NO GROWTH TO DATE CULTURE WILL BE HELD FOR 5 DAYS BEFORE ISSUING A FINAL NEGATIVE REPORT     Performed at [facility]   Report Status PENDING   Incomplete  CULTURE, BLOOD (ROUTINE X 2)     Status: None   Collection Time    06/28/13 12:17 AM      Result Value Range Status   Specimen Description BLOOD RIGHT FOREARM   Final   Special Requests BOTTLES DRAWN AEROBIC ONLY [facility]   Final   Culture  Setup Time     Final   Value: 06/29/2013 05:55     Performed at [facility]   Culture     Final   Value:        BLOOD CULTURE RECEIVED NO GROWTH TO DATE CULTURE WILL BE HELD FOR 5 DAYS BEFORE ISSUING A FINAL NEGATIVE REPORT     Performed at [facility]   Report Status PENDING   Incomplete   Studies/Results: No results found. Medications: I have reviewed the patient's current  medications. Scheduled Meds: . amLODipine  10 mg Oral Daily  . darbepoetin (ARANESP) injection - DIALYSIS  40 mcg Intravenous Q Tue-HD  . doxercalciferol  1 mcg Intravenous Q T,Th,Sa-HD  . feeding supplement (NEPRO CARB STEADY)  237 mL Oral TID BM  . ferric gluconate (FERRLECIT/NULECIT) IV  125 mg Intravenous Q T,Th,Sa-HD  . labetalol  200 mg Oral BID  . multivitamin  1 tablet Oral QHS  . sevelamer carbonate  2,400 mg Oral TID WC  . sodium chloride  3 mL Intravenous Q12H  . Warfarin - Pharmacist Dosing Inpatient   Does not apply q1800   Continuous Infusions: . heparin 2,350 Units/hr (07/02/13 0238)   PRN Meds:.sodium chloride, acetaminophen, HYDROcodone-acetaminophen, ondansetron, sodium chloride Assessment/Plan: #Syncope-- suspicious for cardiac etiology -Monitor on telemetry- moderate Afib control -30 day continuous monitor- if normal consider Loop -Follow up with [name] after discharge  #Small Hematoma in Left Antecubital fossa, S/P LUE fistula transposition on 06/27/13,  -Appreciate VVS following -Norco PRN for pain  -Apply ice to area  -Hgb Stable at ~9.  #Possible small pulmonary embolism and Acute/Subacute right DVT-  - O2 Sats are 98-100% on RA  - Continue IV heparin per pharmacy and transition to coumadin. INR still subtheraputic #Atrial fibrillation--CHADS2VASC score of 1  -Patient on Heparin and transitioning to Coumadin for PE/DVT. INR 1.36 this AM.  -Continue labetalol  #Atypical Chest Pain--resolved. TIMI risk score of 0, cardiac enzymes negative. TWI in V4-6 on EKG that were present before as well.  #ESRD (end stage renal disease) on dialysis--TThS. Follows with Dr. Arrie Aran.  -HD today -Appreciate renal following  -Renal diet  -Trend renal function  #Anemia-- -Will continue to monitor for signs of bleeding at LUE fistula site. Hgb 9.0 this AM  -Aranesp per Nephro -Ferritin 989, given IV Fe per Nephrology #HYPERTENSION  -Continue home labetalol and  amlodipine  - Patient at estimated dry weight #Prolonged QT--qtc 530 on EKG 06/28/13  -Avoid QT prolonging agents  #Elevated liver enzymes -Trending down, Hgb B  and C Ag neg in August per nephrology, Ferritin has decreased from >5000 in August to 989 this admission. Unlikely Iron overload.   -Continue to monitor. #DVTPPx: Heparin drip  Dispo: Disposition is deferred at this time, patient is ESRD and needs to be therapeutic on coumadin prior to discharge.  The patient does have a current PCP Irena Cords, MD) and does not need an [facility] hospital follow-up appointment after discharge.  The patient does not have transportation limitations that hinder transportation to clinic appointments.   .Services Needed at time of discharge: Y = Yes, Blank = No PT:   OT:   RN:   Equipment:   Other:     LOS: 4 days   Carlynn Purl, DO 07/02/2013, 1:23 PM

## 2013-07-02 NOTE — Progress Notes (Signed)
       Patient Name: Trevor [name] Date of Encounter: 07/02/2013    SUBJECTIVE:Denies cardiopulmonary complaints. Left arm pain.  TELEMETRY:  A fib with moderate rate control.: Filed Vitals:   07/02/13 0830 07/02/13 0900 07/02/13 0930 07/02/13 1000  BP: 146/99 143/96 151/95 153/107  Pulse: 83 88 82 102  Temp:      TempSrc:      Resp: 26 30 16 24   Height:      Weight:      SpO2:        Intake/Output Summary (Last 24 hours) at 07/02/13 1029 Last data filed at 07/01/13 1300  Gross per 24 hour  Intake    240 ml  Output    100 ml  Net    140 ml    LABS: Basic Metabolic Panel:  Recent Labs  45/40/98 0430 07/02/13 0700  NA 132* 133*  K 3.9 4.6  CL 94* 96  CO2 24 25  GLUCOSE 90 85  BUN 33* 30*  CREATININE 8.09* 7.97*  CALCIUM 9.3 9.9  PHOS 2.4 3.1   CBC:  Recent Labs  07/01/13 0543 07/01/13 1436 07/02/13 0550  WBC 3.3*  --  4.4  HGB 9.0* 9.5* 9.0*  HCT 27.7* 29.5* 27.4*  MCV 82.2  --  81.5  PLT 161  --  160   Cardiac Enzymes:  Recent Labs  06/29/13 1034  TROPONINI <0.30   BNP: No components found with this basename: POCBNP,  Hemoglobin A1C: No results found for this basename: HGBA1C,  in the last 72 hours Fasting Lipid Panel: No results found for this basename: CHOL, HDL, LDLCALC, TRIG, CHOLHDL, LDLDIRECT,  in the last 72 hours  Radiology/Studies:  No new data  Physical Exam: Blood pressure 153/107, pulse 102, temperature 98.6 F (37 C), temperature source Oral, resp. rate 24, height 6' 1.5" (1.867 m), weight 230 lb 9.6 oz (104.6 kg), SpO2 100.00%. Weight change: 6 lb 2.8 oz (2.8 kg)   Irregularly irregular rhythm Chest is clear.  ASSESSMENT:  1. Recurrent syncope. Suspicious for cardiac etiology  Plan:  1. 30 day continuous monitor and if normal, Loop recorder should be considered.   Windy Fast W 07/02/2013, 10:29 AM

## 2013-07-02 NOTE — Progress Notes (Signed)
VASCULAR PROGRESS NOTE  SUBJECTIVE: No specific complaints.  PHYSICAL EXAM: Filed Vitals:   07/02/13 0707 07/02/13 0730 07/02/13 0800 07/02/13 0830  BP: 147/101 139/99 141/101 146/99  Pulse: 92 83 82 83  Temp:      TempSrc:      Resp: 18 24 24 26   Height:      Weight:      SpO2:       Good thrill in left upper arm AVF. Palpable left radial pulse.  No change in swelling in left arm.  LABS: Lab Results  Component Value Date   WBC 4.4 07/02/2013   HGB 9.0* 07/02/2013   HCT 27.4* 07/02/2013   MCV 81.5 07/02/2013   PLT 160 07/02/2013   Lab Results  Component Value Date   CREATININE 7.97* 07/02/2013   Lab Results  Component Value Date   INR 1.36 07/02/2013   Principal Problem:   Syncope Active Problems:   HYPERTENSION   Atrial fibrillation   ESRD (end stage renal disease) on dialysis   CHF (congestive heart failure)   Anemia   DVT of lower extremity (deep venous thrombosis) RLE  ASSESSMENT AND PLAN:  * Left arm stable. Small hematoma at antecubital fossa stable. No plans for exploration.  * On anticoagulation for ? Small PE and tibial DVT.  Cari Caraway Beeper: [phone] 07/02/2013

## 2013-07-03 DIAGNOSIS — N186 End stage renal disease: Secondary | ICD-10-CM | POA: Diagnosis not present

## 2013-07-03 DIAGNOSIS — D631 Anemia in chronic kidney disease: Secondary | ICD-10-CM | POA: Diagnosis not present

## 2013-07-03 DIAGNOSIS — I4891 Unspecified atrial fibrillation: Secondary | ICD-10-CM | POA: Diagnosis not present

## 2013-07-03 DIAGNOSIS — R55 Syncope and collapse: Secondary | ICD-10-CM | POA: Diagnosis not present

## 2013-07-03 LAB — CBC
Hemoglobin: 9 g/dL — ABNORMAL LOW (ref 13.0–17.0)
RBC: 3.35 MIL/uL — ABNORMAL LOW (ref 4.22–5.81)
WBC: 4.9 10*3/uL (ref 4.0–10.5)

## 2013-07-03 LAB — PROTIME-INR
INR: 1.83 — ABNORMAL HIGH (ref 0.00–1.49)
Prothrombin Time: 20.6 seconds — ABNORMAL HIGH (ref 11.6–15.2)

## 2013-07-03 LAB — HEPARIN LEVEL (UNFRACTIONATED): Heparin Unfractionated: 0.41 IU/mL (ref 0.30–0.70)

## 2013-07-03 MED ORDER — HYDROCODONE-ACETAMINOPHEN 5-325 MG PO TABS
1.0000 | ORAL_TABLET | Freq: Four times a day (QID) | ORAL | Status: DC | PRN
  Administered 2013-07-03: 1 via ORAL
  Filled 2013-07-03 (×2): qty 1

## 2013-07-03 MED ORDER — WARFARIN SODIUM 5 MG PO TABS
5.0000 mg | ORAL_TABLET | Freq: Once | ORAL | Status: AC
  Administered 2013-07-03: 5 mg via ORAL
  Filled 2013-07-03: qty 1

## 2013-07-03 NOTE — Progress Notes (Signed)
Subjective: Patient reports LUE pain is mostly well controlled with 1- 5/325 Norco at times needs two.  Otherwise feels well, no other complaints. Objective: Vital signs in last 24 hours: Filed Vitals:   07/02/13 1138 07/02/13 1225 07/02/13 2334 07/03/13 0545  BP: 151/65 128/89 120/79 148/80  Pulse: 90 100 82 86  Temp: 98.7 F (37.1 C) 98.7 F (37.1 C) 98.8 F (37.1 C) 97.8 F (36.6 C)  TempSrc: Oral Oral Oral Oral  Resp: 20  21 18   Height:      Weight: 225 lb 5 oz (102.2 kg)  230 lb 6.1 oz (104.5 kg)   SpO2: 95% 100% 96% 96%   Weight change: -5 lb 4.7 oz (-2.4 kg)  Intake/Output Summary (Last 24 hours) at 07/03/13 0842 Last data filed at 07/02/13 1138  Gross per 24 hour  Intake      0 ml  Output   3100 ml  Net  -3100 ml   Vitals reviewed.  General: sitting at edge of bed eating breakfast, NAD  Cardiac: Irregularly irregular  Pulm: clear to auscultation bilaterally  Abd: soft, nontender, nondistended, BS present  Ext: LAVF + palpable thrill, mild tenderness, swelling unchanged Neuro: alert and oriented X3  Lab Results: Basic Metabolic Panel:  Recent Labs Lab 06/30/13 0430 07/02/13 0700  NA 132* 133*  K 3.9 4.6  CL 94* 96  CO2 24 25  GLUCOSE 90 85  BUN 33* 30*  CREATININE 8.09* 7.97*  CALCIUM 9.3 9.9  PHOS 2.4 3.1   Liver Function Tests:  Recent Labs Lab 06/29/13 0545 06/30/13 0430 07/01/13 0543 07/02/13 0700  AST 183* 101* 69*  --   ALT 146* 115* 99*  --   ALKPHOS 112 109  --   --   BILITOT 0.6 0.5  --   --   PROT 7.1 7.1  --   --   ALBUMIN 3.1*  3.3* 3.0*  3.1*  --  3.3*   CBC:  Recent Labs Lab 06/28/13 1120  07/02/13 0550 07/03/13 0630  WBC 5.1  < > 4.4 4.9  NEUTROABS 3.9  --   --   --   HGB 9.3*  < > 9.0* 9.0*  HCT 28.4*  < > 27.4* 27.3*  MCV 81.1  < > 81.5 81.5  PLT 138*  < > 160 163  < > = values in this interval not displayed. Cardiac Enzymes:  Recent Labs Lab 06/28/13 1120 06/29/13 0545 06/29/13 1034  TROPONINI  <0.30 <0.30 <0.30   Thyroid Function Tests:  Recent Labs Lab 06/30/13 0728  TSH 1.942   Coagulation:  Recent Labs Lab 06/29/13 2040 07/01/13 0543 07/02/13 0550 07/03/13 0630  LABPROT 15.5* 15.1 16.4* 20.6*  INR 1.26 1.22 1.36 1.83*   Anemia Panel:  Recent Labs Lab 06/30/13 0728  FERRITIN 989*   Urine Drug Screen: Drugs of Abuse     Component Value Date/Time   LABOPIA NONE DETECTED 12/11/2007 2233   COCAINSCRNUR NONE DETECTED 12/11/2007 2233   LABBENZ NONE DETECTED 12/11/2007 2233   AMPHETMU NONE DETECTED 12/11/2007 2233   THCU NONE DETECTED 12/11/2007 2233   LABBARB  Value: NONE DETECTED        DRUG SCREEN FOR MEDICAL PURPOSES ONLY.  IF CONFIRMATION IS NEEDED FOR ANY PURPOSE, NOTIFY LAB WITHIN 5 DAYS. 12/11/2007 2233     Micro Results: Recent Results (from the past 240 hour(s))  CULTURE, BLOOD (ROUTINE X 2)     Status: None   Collection Time  06/28/13 12:12 AM      Result Value Range Status   Specimen Description BLOOD RIGHT HAND   Final   Special Requests BOTTLES DRAWN AEROBIC ONLY 10CC   Final   Culture  Setup Time     Final   Value: 06/29/2013 05:55     Performed at [facility]   Culture     Final   Value:        BLOOD CULTURE RECEIVED NO GROWTH TO DATE CULTURE WILL BE HELD FOR 5 DAYS BEFORE ISSUING A FINAL NEGATIVE REPORT     Performed at [facility]   Report Status PENDING   Incomplete  CULTURE, BLOOD (ROUTINE X 2)     Status: None   Collection Time    06/28/13 12:17 AM      Result Value Range Status   Specimen Description BLOOD RIGHT FOREARM   Final   Special Requests BOTTLES DRAWN AEROBIC ONLY [facility]   Final   Culture  Setup Time     Final   Value: 06/29/2013 05:55     Performed at [facility]   Culture     Final   Value:        BLOOD CULTURE RECEIVED NO GROWTH TO DATE CULTURE WILL BE HELD FOR 5 DAYS BEFORE ISSUING A FINAL NEGATIVE REPORT     Performed at [facility]   Report Status PENDING   Incomplete    Studies/Results: No results found. Medications: I have reviewed the patient's current medications. Scheduled Meds: . amLODipine  10 mg Oral Daily  . darbepoetin (ARANESP) injection - DIALYSIS  40 mcg Intravenous Q Tue-HD  . doxercalciferol  1 mcg Intravenous Q T,Th,Sa-HD  . feeding supplement (NEPRO CARB STEADY)  237 mL Oral TID BM  . ferric gluconate (FERRLECIT/NULECIT) IV  125 mg Intravenous Q T,Th,Sa-HD  . labetalol  200 mg Oral BID  . multivitamin  1 tablet Oral QHS  . sevelamer carbonate  2,400 mg Oral TID WC  . sodium chloride  3 mL Intravenous Q12H  . Warfarin - Pharmacist Dosing Inpatient   Does not apply q1800   Continuous Infusions: . heparin 2,350 Units/hr (07/02/13 1420)   PRN Meds:.sodium chloride, acetaminophen, HYDROcodone-acetaminophen, ondansetron, sodium chloride Assessment/Plan: #Syncope-- suspicious for cardiac etiology, r/o prolonged effects of anesthesia -Monitor on telemetry- moderate Afib control -30 day continuous monitor at discharge- if normal consider Loop -Follow up with [name] after discharge  #Small Hematoma in Left Antecubital fossa, S/P LUE fistula transposition on 06/27/13,  -Norco PRN for pain  -Apply ice to area  -Hgb Stable at ~9.  -F/U with Dr Darrick Penna in 2-3 weeks #Possible small pulmonary embolism and Acute/Subacute right DVT-  - Continue IV heparin per pharmacy and transition to coumadin. INR still subtheraputic, at 1.83 today. #Atrial fibrillation--CHADS2VASC score of 1  -Patient on Heparin and transitioning to Coumadin for PE/DVT. INR 1.36 this AM.  -Continue labetalol  #ESRD (end stage renal disease) on dialysis--TThS. Follows with Dr. Arrie Aran.  -Appreciate renal following  -Renal diet  -Trend renal function  #Anemia-- -Will continue to monitor for signs of bleeding at LUE fistula site. Hgb 9.0 this AM  -Aranesp per Nephro -Ferritin 989, given IV Fe per Nephrology #HYPERTENSION  -Continue home labetalol and amlodipine  -  Patient at estimated dry weight #Prolonged QT--qtc 530 on EKG 06/28/13  -Avoid QT prolonging agents  #Elevated liver enzymes -Trending down, Hgb B and C Ag neg in August per nephrology, Ferritin has decreased  from >5000 in August to 989 this admission. Unlikely Iron overload.   -Continue to monitor. #DVTPPx: Heparin drip  Dispo: Disposition is deferred at this time, patient is ESRD and needs to be therapeutic on coumadin prior to discharge.  The patient does have a current PCP Irena Cords, MD) and does not need an [facility] hospital follow-up appointment after discharge.  The patient does not have transportation limitations that hinder transportation to clinic appointments.   .Services Needed at time of discharge: Y = Yes, Blank = No PT:   OT:   RN:   Equipment:   Other:     LOS: 5 days   Carlynn Purl, DO 07/03/2013, 8:42 AM

## 2013-07-03 NOTE — Progress Notes (Signed)
[facility] Progress Note  Subjective:   No c/o  Objective Filed Vitals:   07/02/13 1138 07/02/13 1225 07/02/13 2334 07/03/13 0545  BP: 151/65 128/89 120/79 148/80  Pulse: 90 100 82 86  Temp: 98.7 F (37.1 C) 98.7 F (37.1 C) 98.8 F (37.1 C) 97.8 F (36.6 C)  TempSrc: Oral Oral Oral Oral  Resp: 20  21 18   Height:      Weight: 102.2 kg (225 lb 5 oz)  104.5 kg (230 lb 6.1 oz)   SpO2: 95% 100% 96% 96%   Physical Exam General: NAD sitting on side of bed Heart: RRR Lungs: rare crackles Abdomen: soft Extremities: no LE edema Dialysis Access: maturing left upper AVF patent stable hematoma in antecubital area; mild generalized arm edema- right I-J   Dialysis Orders: AF on TTS .  4.5hrs 101.5kg 2K/2.25Ca Heparin Rt I-J PC Profile 2  [name] 1 mcg IV/HD Epogen 4800 Units IV/HD Venofer 0  Maturing L BVT placed 10/20 by Dr Darrick Penna  Labs: Hgb 9.7, P 3.4, Tsat 17%, Ferritin >5000 in Sept, PTH 100   Assessment/Plan:  1. Syncope - Known atrial flutter. Trop neg x 1. POD 4 from placement of left BVT. CT angio cannot exclude small pulmonary embolus. See #9. Blood cultures negative to date. Cards planning event monitor after d/c; encouraged to ambulate in halls 2. Acute/subacute DVT in right post tibial vein / possible small PE by CT- anticoag per pharmacy- INR slowing increasing 1.83 today 3. HD access - HD via PC. New L BVT with LUA pain and swelling; VVS has evaluated stable 4. ESRD - TTS - no problems with dialysis Sat. 5. Hypertension/volume - controlled on amlodipine. Small right pleural effusion by CT. net UF 3.1 L Saturday. Pre and post HD don't make sense - 2.4 kg difference with post wt 102.2 - keep edw same for now 6. Anemia - Hgb 9.0 stable Continue Aranesp 40 q tues. Last Tsat 17%. Ferritin 989 this admit. FOBT negative. Start IV Fe load x 8 doses - will repeat ferritin with Nov labs at dialysis 7. Metabolic bone disease - Ca 9.9 (10.4 corrected). Phos 3.1 on  Renvela 3 ac (lowered this admit). Continue Vit D. 8. Nutrition - Renal diet, multivitamin, nepro 9. Elevated LFTs - New since last checked in August; also noted to have a ferritin >5000 when last checked at dialysis. Repeat ferritin 989 and LFTs trending down this admit. Hepb B s Ag neg and Hep C neg in August. Follow for now - unclear what prompted spike in LFTs 10. Thrombocytopenia - resolved - in normal range  Sheffield Slider, PA-C [facility] Beeper [phone] 07/03/2013,8:11 AM  LOS: 5 days    Patient seen and examined, agree with assessment and plan as above.  INR is almost is range, anticipate d/c soon. No new probs today.  [name] MD   pager [phone]  cell [phone] 07/03/2013, 2:16 PM    Additional Objective Labs: Basic Metabolic Panel: Lab Results  Component Value Date   INR 1.83* 07/03/2013   INR 1.36 07/02/2013   INR 1.22 07/01/2013    Recent Labs Lab 06/29/13 0545 06/30/13 0430 07/02/13 0700  NA 138 132* 133*  K 4.1 3.9 4.6  CL 98 94* 96  CO2 29 24 25   GLUCOSE 95 90 85  BUN 26* 33* 30*  CREATININE 6.48* 8.09* 7.97*  CALCIUM 9.2 9.3 9.9  PHOS 3.2 2.4 3.1   Liver Function Tests:  Recent Labs Lab  06/29/13 0545 06/30/13 0430 07/01/13 0543 07/02/13 0700  AST 183* 101* 69*  --   ALT 146* 115* 99*  --   ALKPHOS 112 109  --   --   BILITOT 0.6 0.5  --   --   PROT 7.1 7.1  --   --   ALBUMIN 3.1*  3.3* 3.0*  3.1*  --  3.3*   CBC:  Recent Labs Lab 06/28/13 1120 06/29/13 0545 06/30/13 0430 07/01/13 0543 07/01/13 1436 07/02/13 0550 07/03/13 0630  WBC 5.1 3.8* 3.7* 3.3*  --  4.4 4.9  NEUTROABS 3.9  --   --   --   --   --   --   HGB 9.3* 9.1* 8.5* 9.0* 9.5* 9.0* 9.0*  HCT 28.4* 27.4* 26.2* 27.7* 29.5* 27.4* 27.3*  MCV 81.1 80.4 81.4 82.2  --  81.5 81.5  PLT 138* 135* 144* 161  --  160 163   Blood Culture    Component Value Date/Time   SDES BLOOD RIGHT FOREARM 06/28/2013 0017   SPECREQUEST BOTTLES DRAWN AEROBIC ONLY  7CC 06/28/2013 0017   CULT  Value:        BLOOD CULTURE RECEIVED NO GROWTH TO DATE CULTURE WILL BE HELD FOR 5 DAYS BEFORE ISSUING A FINAL NEGATIVE REPORT Performed at [facility] 06/28/2013 0017   REPTSTATUS PENDING 06/28/2013 0017    Cardiac Enzymes:  Recent Labs Lab 06/28/13 0012 06/28/13 1120 06/29/13 0545 06/29/13 1034  TROPONINI <0.30 <0.30 <0.30 <0.30    Medications: . heparin 2,350 Units/hr (07/02/13 1420)   . amLODipine  10 mg Oral Daily  . darbepoetin (ARANESP) injection - DIALYSIS  40 mcg Intravenous Q Tue-HD  . doxercalciferol  1 mcg Intravenous Q T,Th,Sa-HD  . feeding supplement (NEPRO CARB STEADY)  237 mL Oral TID BM  . ferric gluconate (FERRLECIT/NULECIT) IV  125 mg Intravenous Q T,Th,Sa-HD  . labetalol  200 mg Oral BID  . multivitamin  1 tablet Oral QHS  . sevelamer carbonate  2,400 mg Oral TID WC  . sodium chloride  3 mL Intravenous Q12H  . Warfarin - Pharmacist Dosing Inpatient   Does not apply [phone]

## 2013-07-03 NOTE — Progress Notes (Signed)
ANTICOAGULATION CONSULT NOTE - Follow Up Consult  Pharmacy Consult for Heparin and Coumadin Indication: Confirmed RLE DVT and possible small PE and atrial fibrillation  No Known Allergies  Patient Measurements: Height: 6' 1.5" (186.7 cm) Weight: 230 lb 6.1 oz (104.5 kg) IBW/kg (Calculated) : 81.05 Heparin Dosing Weight: 102 kg  Vital Signs: Temp: 98 F (36.7 C) (10/26 1314) Temp src: Oral (10/26 1314) BP: 123/84 mmHg (10/26 1314) Pulse Rate: 79 (10/26 1314)  Labs:  Recent Labs  07/01/13 0543 07/01/13 1436 07/02/13 0550 07/02/13 0700 07/03/13 0630  HGB 9.0* 9.5* 9.0*  --  9.0*  HCT 27.7* 29.5* 27.4*  --  27.3*  PLT 161  --  160  --  163  LABPROT 15.1  --  16.4*  --  20.6*  INR 1.22  --  1.36  --  1.83*  HEPARINUNFRC 0.37  --  0.42  --  0.41  CREATININE  --   --   --  7.97*  --     Estimated Creatinine Clearance: 12.6 ml/min (by C-G formula based on Cr of 7.97).  Assessment: 62 y.o. male on day # 5 Heparin and Coumadin for RLE DVT and possible PE (via CT angio on 10/21). Heparin remains therapeutic (0.41) on 2350 units/hr.  INR now up to 1.83.  Has had 10 mg, 5 mg, 10 mg,  10 mg Coumadin.    Recent surgery (POD #6) for first stage of LUE AVF formation. Small hematoma at antecubital fossa noted stable per Dr. Edilia Bo.  Unable to stop anticoagulants with DVT and ? small PE. Hgb low stable; platelet low normal. Patient feels his arm is more swollen today; tight.    Goal of Therapy:  INR 2-3 Heparin level 0.3-0.7 units/ml Monitor platelets by anticoagulation protocol: Yes   Plan:   Continue heparin drip rate at 2350 units/hr.  Coumadin 5 mg today.  Daily heparin level, CBC and PT/INR.  Will follow up status of left arm hematoma and swelling.  Expect heparin to continue until INR > 2 x 2 days.  Dennie Fetters, [place] Pager: [phone] 07/03/2013, 3:47 PM

## 2013-07-03 NOTE — Progress Notes (Signed)
VASCULAR PROGRESS NOTE  SUBJECTIVE: No specific complaints.  PHYSICAL EXAM: Filed Vitals:   07/02/13 1138 07/02/13 1225 07/02/13 2334 07/03/13 0545  BP: 151/65 128/89 120/79 148/80  Pulse: 90 100 82 86  Temp: 98.7 F (37.1 C) 98.7 F (37.1 C) 98.8 F (37.1 C) 97.8 F (36.6 C)  TempSrc: Oral Oral Oral Oral  Resp: 20  21 18   Height:      Weight: 225 lb 5 oz (102.2 kg)  230 lb 6.1 oz (104.5 kg)   SpO2: 95% 100% 96% 96%   Left arm swelling unchanged. Good thrill in left basilic vein transposition. Palpable left radial pulse.  LABS: Lab Results  Component Value Date   WBC 4.9 07/03/2013   HGB 9.0* 07/03/2013   HCT 27.3* 07/03/2013   MCV 81.5 07/03/2013   PLT 163 07/03/2013   Lab Results  Component Value Date   CREATININE 7.97* 07/02/2013   Lab Results  Component Value Date   INR 1.83* 07/03/2013   CBG (last 3)  No results found for this basename: GLUCAP,  in the last 72 hours  Principal Problem:   Syncope Active Problems:   HYPERTENSION   Atrial fibrillation   ESRD (end stage renal disease) on dialysis   CHF (congestive heart failure)   Anemia   DVT of lower extremity (deep venous thrombosis) RLE   ASSESSMENT AND PLAN: * Left arm swelling is stable. * Vascular will be available as needed.  * I will arrange a F/U visit with Dr. Darrick Penna in 2-3 weeks.   Cari Caraway Beeper: [phone] 07/03/2013

## 2013-07-03 NOTE — Progress Notes (Signed)
       Patient Name: Trevor [name] Date of Encounter: 07/03/2013    SUBJECTIVE: No cardiac complaints. No episodes of dizziness.  TELEMETRY:  Atrial fibrillation with moderate rate control: Filed Vitals:   07/02/13 1138 07/02/13 1225 07/02/13 2334 07/03/13 0545  BP: 151/65 128/89 120/79 148/80  Pulse: 90 100 82 86  Temp: 98.7 F (37.1 C) 98.7 F (37.1 C) 98.8 F (37.1 C) 97.8 F (36.6 C)  TempSrc: Oral Oral Oral Oral  Resp: 20  21 18   Height:      Weight: 225 lb 5 oz (102.2 kg)  230 lb 6.1 oz (104.5 kg)   SpO2: 95% 100% 96% 96%    Intake/Output Summary (Last 24 hours) at 07/03/13 0832 Last data filed at 07/02/13 1138  Gross per 24 hour  Intake      0 ml  Output   3100 ml  Net  -3100 ml    LABS: Basic Metabolic Panel:  Recent Labs  91/47/82 0700  NA 133*  K 4.6  CL 96  CO2 25  GLUCOSE 85  BUN 30*  CREATININE 7.97*  CALCIUM 9.9  PHOS 3.1   CBC:  Recent Labs  07/02/13 0550 07/03/13 0630  WBC 4.4 4.9  HGB 9.0* 9.0*  HCT 27.4* 27.3*  MCV 81.5 81.5  PLT 160 163     Radiology/Studies:  No new data  Physical Exam: Blood pressure 148/80, pulse 86, temperature 97.8 F (36.6 C), temperature source Oral, resp. rate 18, height 6' 1.5" (1.867 m), weight 230 lb 6.1 oz (104.5 kg), SpO2 96.00%. Weight change: -5 lb 4.7 oz (-2.4 kg)   Irregular rhythm. Clear chest Left arm swollen  ASSESSMENT:  1. Syncope, unexplained. Suspicious for cardiac etiology. Rule out prolonged effects of anesthesia 2. End-stage kidney disease  Plan:  Continue to monitor for as long as the patient is in the hospital and we will set up a 30 day continuous monitor at discharge.  Selinda Eon 07/03/2013, 8:32 AMhsro

## 2013-07-04 ENCOUNTER — Telehealth: Payer: Self-pay | Admitting: Vascular Surgery

## 2013-07-04 ENCOUNTER — Other Ambulatory Visit: Payer: Self-pay | Admitting: Physician Assistant

## 2013-07-04 DIAGNOSIS — I82409 Acute embolism and thrombosis of unspecified deep veins of unspecified lower extremity: Secondary | ICD-10-CM | POA: Diagnosis not present

## 2013-07-04 DIAGNOSIS — I4891 Unspecified atrial fibrillation: Secondary | ICD-10-CM | POA: Diagnosis not present

## 2013-07-04 DIAGNOSIS — I2699 Other pulmonary embolism without acute cor pulmonale: Secondary | ICD-10-CM | POA: Diagnosis not present

## 2013-07-04 DIAGNOSIS — D631 Anemia in chronic kidney disease: Secondary | ICD-10-CM | POA: Diagnosis not present

## 2013-07-04 DIAGNOSIS — R55 Syncope and collapse: Secondary | ICD-10-CM

## 2013-07-04 DIAGNOSIS — N186 End stage renal disease: Secondary | ICD-10-CM | POA: Diagnosis not present

## 2013-07-04 LAB — PROTIME-INR
INR: 2.5 — ABNORMAL HIGH (ref 0.00–1.49)
Prothrombin Time: 26.2 seconds — ABNORMAL HIGH (ref 11.6–15.2)

## 2013-07-04 LAB — COMPREHENSIVE METABOLIC PANEL
AST: 35 U/L (ref 0–37)
Albumin: 3.2 g/dL — ABNORMAL LOW (ref 3.5–5.2)
BUN: 33 mg/dL — ABNORMAL HIGH (ref 6–23)
CO2: 24 mEq/L (ref 19–32)
Chloride: 95 mEq/L — ABNORMAL LOW (ref 96–112)
Creatinine, Ser: 7.73 mg/dL — ABNORMAL HIGH (ref 0.50–1.35)
GFR calc non Af Amer: 7 mL/min — ABNORMAL LOW (ref >90)
Potassium: 4.3 mEq/L (ref 3.5–5.1)
Sodium: 132 mEq/L — ABNORMAL LOW (ref 135–145)
Total Protein: 7.4 g/dL (ref 6.0–8.3)

## 2013-07-04 LAB — HEPARIN LEVEL (UNFRACTIONATED): Heparin Unfractionated: 0.22 IU/mL — ABNORMAL LOW (ref 0.30–0.70)

## 2013-07-04 LAB — CBC
Hemoglobin: 8.5 g/dL — ABNORMAL LOW (ref 13.0–17.0)
MCH: 27.3 pg (ref 26.0–34.0)
Platelets: 150 10*3/uL (ref 150–400)
RBC: 3.11 MIL/uL — ABNORMAL LOW (ref 4.22–5.81)
WBC: 4.7 10*3/uL (ref 4.0–10.5)

## 2013-07-04 LAB — IRON AND TIBC
Iron: 38 ug/dL — ABNORMAL LOW (ref 42–135)
Saturation Ratios: 16 % — ABNORMAL LOW (ref 20–55)
TIBC: 235 ug/dL (ref 215–435)
UIBC: 197 ug/dL (ref 125–400)

## 2013-07-04 MED ORDER — HYDROCODONE-ACETAMINOPHEN 5-325 MG PO TABS: 1.0000 | ORAL_TABLET | Freq: Four times a day (QID) | ORAL | Status: DC | PRN

## 2013-07-04 MED ORDER — WARFARIN SODIUM 5 MG PO TABS
5.0000 mg | ORAL_TABLET | Freq: Once | ORAL | Status: AC
  Administered 2013-07-04: 5 mg via ORAL
  Filled 2013-07-04: qty 1

## 2013-07-04 MED ORDER — WARFARIN SODIUM 5 MG PO TABS: 5.0000 mg | ORAL_TABLET | Freq: Every day | ORAL | Status: DC

## 2013-07-04 NOTE — Discharge Summary (Signed)
Name: Trevor [name] MRN: [id number] DOB: 1951/06/30 62 y.o. PCP: Irena Cords, MD  Date of Admission: 06/28/2013 10:51 AM Date of Discharge: 07/04/2013 Attending Physician: Dr. Kem Kays Discharge Diagnosis: Principal Problem:   Syncope Active Problems:   HYPERTENSION   Atrial fibrillation   ESRD (end stage renal disease) on dialysis   CHF (congestive heart failure)   Anemia   DVT of lower extremity (deep venous thrombosis) RLE   LUE Hematoma   Anemia of chronic disease   Prolonged QT   Discharge Medications:   Medication List         amLODipine 10 MG tablet  Commonly known as:  NORVASC  Take 10 mg by mouth daily.     Arginine 1000 MG Tabs  Take 2,000 mg by mouth daily as needed (for vitamin).     HYDROcodone-acetaminophen 5-325 MG per tablet  Commonly known as:  NORCO/VICODIN  Take 1-2 tablets by mouth every 6 (six) hours as needed.     labetalol 200 MG tablet  Commonly known as:  NORMODYNE  Take 200 mg by mouth 2 (two) times daily.     sevelamer carbonate 800 MG tablet  Commonly known as:  RENVELA  Take 3,200 mg by mouth as needed (for kidneys).     warfarin 5 MG tablet  Commonly known as:  COUMADIN  Take 1 tablet (5 mg total) by mouth daily.        Disposition and follow-up:   Mr.Sulo A Daphine Deutscher was discharged from [facility] in Good condition.  At the hospital follow up visit please address:  1.  Adjust warfarin as necessary to maintain INR 2-3.  Assess LUE pain and swelling (to see Dr. Darrick Penna, Vascular Sx in 2-3 weeks), patient to get 30 day cardiac monitor at cardiologist on  10/29 and follow up with [name].  2.  Labs / imaging needed at time of follow-up: INR, Monitor renal function, Hgb per nephrology at Dialysis  3.  Pending labs/ test needing follow-up: None  Follow-up Appointments: Follow-up Information   Follow up with [facility] On 07/06/2013. (Appointment for Event Monitor at 4:15 pm)    Contact  information:   [address] [place] [address]       Follow up with [name], MD On 08/08/2013. (at 10:45 am)    Specialty:  Cardiology   Contact information:   [address] [place] [address] [phone]       Schedule an appointment as soon as possible for a visit with [name], MD. (Make a follow up appointment for 1-2 weeks, call if swelling or pain is worse.)    Specialty:  Vascular Surgery   Contact information:   [address] [place] [address] [phone]       Discharge Instructions: Discharge Orders   Future Appointments Provider Department Dept Phone   07/06/2013 4:15 PM [facility] [place] Office [phone]   07/15/2013 1:30 PM Louis Meckel, MD [facility] [phone]   07/21/2013 9:30 AM [name], MD [facility] [phone]   08/08/2013 10:45 AM [name], MD [facility] [phone]   08/18/2013 8:30 AM [facility] [address] [phone]   08/18/2013 9:30 AM [name], MD [facility] [phone]   Future Orders Complete By Expires   Call MD for:  persistant dizziness or light-headedness  As directed    Call MD for:  persistant nausea and vomiting  As directed    Call MD for:  redness, tenderness, or signs of infection (pain, swelling, redness, odor or green/yellow discharge around incision site)  As directed    Call MD for:  severe uncontrolled pain  As directed    Call MD for:  temperature >100.4  As directed    Diet - low sodium heart healthy  As directed    Discharge instructions  As directed    Comments:     Take 5mg  of Coumadin daily at 6pm.  Your INR will be measured tomorrow at Dialysis and they may adjust your dose.  You will need to take this for at least 3 months. Please call Dr. Darrick Penna office if worsening Left Arm pain or  swelling.  Please return to the ED for severe pain, dizziness or feeling that you may pass out.   Increase activity slowly  As directed    No wound care  As directed       Consultations: Treatment Team:  Maree Krabbe, MD Everette Rank, MD Chuck Hint, MD  Procedures Performed:  Dg Chest 2 View  06/08/2013   CLINICAL DATA:  Cough and congestion. Dialysis patient  EXAM: CHEST  2 VIEW  COMPARISON:  05/04/2013  FINDINGS: Cardiac enlargement with vascular congestion. Improvement in pulmonary edema since the prior study. Minimal pleural effusion. PermCath tips in the right atrium unchanged.  IMPRESSION: Findings consistent with congestive heart failure and edema which has improved since prior study. Minimal pleural effusion.   Electronically Signed   By: Marlan Palau M.D.   On: 06/08/2013 16:37   Ct Angio Chest Pe W/cm &/or Wo Cm  06/28/2013   CLINICAL DATA:  Shortness of breath, chest pain, syncope.  EXAM: CT ANGIOGRAPHY CHEST WITH CONTRAST  TECHNIQUE: Multidetector CT imaging of the chest was performed using the standard protocol during bolus administration of intravenous contrast. Multiplanar CT image reconstructions including MIPs were obtained to evaluate the vascular anatomy.  CONTRAST:  OMNIPAQUE IOHEXOL 350 MG/ML SOLN  COMPARISON:  CHEST x-ray 06/08/2013.  FINDINGS: There are small apparent filling defects within a left lower lobe pulmonary artery near the left lung base supplying the posterior basal segment. There is respiratory motion on these images and is difficult to determine if this is artifactual or related to a very small pulmonary embolus. No definite other pulmonary emboli noted.  There is mediastinal adenopathy noted with partially calcified mediastinal lymph nodes. Bilateral hilar calcified lymph nodes are also present. This is presumably related to old granulomatous disease. Heart is enlarged. Calcified granuloma in the right middle lobe of. Small right pleural  effusion. Scattered coronary artery calcifications are present. Chest wall soft tissues are unremarkable. Imaging into the upper abdomen shows no acute findings.  No acute bony abnormality. Imaging into the upper abdomen shows no acute findings.  Review of the MIP images confirms the above findings.  IMPRESSION: Small filling defect in a lower lobe pulmonary arterial branch. I favor this is related to respiratory motion as no other pulmonary emboli are seen, but it is difficult to completely exclude a very small pulmonary embolus.  Small right pleural effusion.  Calcified mediastinal and bilateral hilar adenopathy, presumably related to old granulomatous disease. Calcified granuloma in the right middle lobe.  Cardiomegaly.   Electronically Signed   By: Charlett Nose M.D.   On: 06/28/2013 12:18   07/01/13 Duplex Dialysis Access Study information: Study  status: Routine. Procedure: A vascular evaluation was performed with the patient in the supine position. Image quality was excellent. Hemodialysis access follow-up study. Duplex scan. Patient status: Inpatient. Subclavian arteries: Left brachial: There is a surgical arteriovenous fistula to the cephalic vein. ------------------------------------------------------------ Summary: Left brachial: There is a surgical arteriovenous fistula to the cephalic vein. - Patent AV graft. - Irregular shaped hematoma along the upper arm incision. No active bleeding noted. Prepared and Electronically Authenticated by Cari Caraway  06/29/13 Lower Extremity Venous Duplex B/L Summary: - No evidence of deep vein or superficial thrombosis involving the left lower extremity. An enlarged inguinal lymph node is noted on the right. There appears to be acute to subacuteDVT in the right posterior tibial vein. Pulsatile waveforms bilaterally suggest fluid overload. - No evidence of Baker's cyst on the right or left. Other specific details can be found in the table(s)  above. Prepared and Electronically Authenticated by Josephina Gip 2014-10-22T13:[id number]  Admission HPI: JARRIN STALEY is a 62 year old male with a PMH of ESRD on HD TTHS, A. Fib, HTN, Anemia, Secondary hyperparathyroidism, OSA, classic upper airway cough syndrome (Dx Dr Sherene Sires 06/08/13) He presents to the [facility] today after 4 syncopal episodes today. He reports that he passed out 4 times while walking today, he reports these were witnessed by his girlfriend [name]. He does report LOC but doesn't know for how long. Ms Lennice Sites was not present on interview to coroborate. He does not believe he had any shaking or tongue biting. He does not know if he hit is head but he denies any head pain. He denies any prodromal symptoms. He denies loss of bowel or bladder. He denies any dizziness on standing. He does report confusion after the episodes. He reports he does feel both of his legs were weak. He denies any palpitations. He does report SOB but this has been on going for the past 2 months and has been associated with a chronic cough for which he has been seen by Dr. Sherene Sires (Pulmonology).  In addition this is not his first syncopal episode. He reports that he has had numerous issues over the past 2 months. He was admitted to [facility] 04/27/13 after a syncopal episode which the patient reported happened during a coughing spell. Workup in the hospital revealed newly diagnosed A fib which was thought to be a possible cause. Cardiology was consulted and recommended coumadin for 4 weeks followed by cardioversion and coumadin for an additional 4 weeks, after that ASA 81mg  would be used for A/C since his CHADS2VASC score was 1. Patient however choose to not take either. He returned to the hospital on 05/02/13 with a fever, and also found to be in Afib with RVR. He was found to have an infection of his right AVF and had to have it removed. Cultures were negative and he was treated with Vancomycin for 2 weeks.  Attending Addendum:  I  have seen and evaluated [name] and discussed their care with the Residency Team. Patient seen and examined. 62 yr old AAM w/ ESRD, Afib on ASA, HTN, anemia chronic disease, presented due to multiple syncopal episodes. CTA chest was inconclusive for PE, but now LE DVT shows evidence of DVT in RLE posterior tibial vein. At this point, I would not repeat a CTA chest. He is agreeable to coumadin. He understands risks vs benefits and choose to anticoagulate. Continue heparin gtt. Discuss with vascular surgery risk of bleeding in setting of recent AVF creation.  Consult  cardiology. He has Afib and likely had RVR as a result of PE. Plans for future cardioversion? It seems this did not go through. He will need to discuss this with cards again Jonah Blue, DO, [facility] Course by problem list: 1. Syncope    On presentation to the emergency department patient had reported 4-5 syncopal episodes that morning.  Orthostatic vital signs taken in the emergency department were negative, an EKG obtained showed A fib with a rate of 113. A CTA was taken but showed only a questionable small PE that would be unlikely to have caused his syncopal episodes.  Of note he did have an admission 2 months prior and workup that included an echo that was unrevealing for a likely cause.  His syncopal episodes were suspicious for cardiac etiology vs prolonged effects of anesthesia from his LUE fistula transposition the day prior to admission.  He was admitted and monitored during his stay on telemetry which was unrevealing for a likely cause, it only showed moderate Afib control.  Cardiology was consulted and evaluated the patient during his stay, they recommended a 30 day continues monitor at discharge and to consider a Loop recorder if normal.  2. Possible small pulmonary embolism and Acute/Subacute right DVT-  In the ED patient was found to have an elevated HR (was in A fib) and some SOB, he had recently had surgery and  presented for a syncopal episode.  A CTA was obtained which revealed a questionable very small pulmonary embolus. After speaking with his Vascular surgeon Dr. Darrick Penna he was started on IV Heparin until a DVT/PE could be ruled out.  A LE Dopper revealed a right DVT, this was likely provoked due to recent surgeries.  He was continued on IV heparin and bridged to coumadin.  On the day of discharge his INR was 2.5, he wished to go home, although the warfarin protocol states that INR needs to be theraputic for two day or you need to bridge with lovenox it was felt that his risk for expanding his LUE hematoma was high.  He was discharged with a prescription and instructions to take 5mg  warfarin daily until his INR was checked at dialysis.  He will need to continue treatment for at least 3 months.  This could possibly be coordinated with cardiology to potentially try to cardiovert the patient for afib after 4 weeks of therapy.  3. Small Hematoma in Left Antecubital fossa, S/P LUE fistula transposition on 06/27/13,  Patient presented after LUE fistula transposition surgery the day prior to admission.  He was started on Heparin due to #2 as above, during his hospitalization he developed some increased swelling and pain at the site. A Doppler U/S was obtained that showed a stable small hematoma at the site.  Vascular surgery was consulted and followed but did not wish to open site to drain.  He is to follow up with Dr. Darrick Penna in 2-3 weeks after discharge.  4. Atrial fibrillation--CHADS2VASC score of 1, had been recommended Asprin in the past. Was started on Heparin and transitioned to coumadin due to #2.  He was monitored on tele and had moderate control of his Afib with his home labetalol.  He is to follow up with his cardiologist after discharge.  5. ESRD (end stage renal disease) on dialysis--TThS. Follows with [facility], he was followed by Nephrology during his stay and maintained his normal  dialysis scheduled.  6. Anemia of chronic disease--  His Hgb was monitored  throughout his stay due to risk of bleeding given his recent surgery.  His hemoglobin remained stable during his stay ~9.  He was given Aranesp and IV, iron per nephrology's recommendations.  7. HYPERTENSION  His home labetalol and amlodipine were continued and he maintained regular dialysis schedule.  8. Prolonged QT--qtc 530 on EKG 06/28/13   9. Elevated liver enzymes  -Appreciated on admission, trending down, Hgb B and C Ag neg in August per nephrology, Ferritin has decreased from >5000 in August to 989 this admission. Unlikely Iron overload.  These trended down to normal limits by discharge.   Discharge Vitals:   BP 120/81  Pulse 79  Temp(Src) 98.3 F (36.8 C) (Oral)  Resp 21  Ht 6' 1.5" (1.867 m)  Wt 234 lb 2.1 oz (106.2 kg)  BMI 30.47 kg/m2  SpO2 99%  Discharge Labs:  Results for orders placed during the hospital encounter of 06/28/13 (from the past 24 hour(s))  HEPARIN LEVEL (UNFRACTIONATED)     Status: Abnormal   Collection Time    07/04/13  5:40 AM      Result Value Range   Heparin Unfractionated 0.22 (*) 0.30 - 0.70 IU/mL  CBC     Status: Abnormal   Collection Time    07/04/13  5:40 AM      Result Value Range   WBC 4.7  4.0 - 10.5 K/uL   RBC 3.11 (*) 4.22 - 5.81 MIL/uL   Hemoglobin 8.5 (*) 13.0 - 17.0 g/dL   HCT 09.8 (*) 11.9 - 14.7 %   MCV 81.0  78.0 - 100.0 fL   MCH 27.3  26.0 - 34.0 pg   MCHC 33.7  30.0 - 36.0 g/dL   RDW 82.9 (*) 56.2 - 13.0 %   Platelets 150  150 - 400 K/uL  PROTIME-INR     Status: Abnormal   Collection Time    07/04/13  5:40 AM      Result Value Range   Prothrombin Time 26.2 (*) 11.6 - 15.2 seconds   INR 2.50 (*) 0.00 - 1.49  COMPREHENSIVE METABOLIC PANEL     Status: Abnormal   Collection Time    07/04/13  5:40 AM      Result Value Range   Sodium 132 (*) 135 - 145 mEq/L   Potassium 4.3  3.5 - 5.1 mEq/L   Chloride 95 (*) 96 - 112 mEq/L   CO2 24  19 - 32  mEq/L   Glucose, Bld 80  70 - 99 mg/dL   BUN 33 (*) 6 - 23 mg/dL   Creatinine, Ser 8.65 (*) 0.50 - 1.35 mg/dL   Calcium 9.9  8.4 - 78.4 mg/dL   Total Protein 7.4  6.0 - 8.3 g/dL   Albumin 3.2 (*) 3.5 - 5.2 g/dL   AST 35  0 - 37 U/L   ALT 45  0 - 53 U/L   Alkaline Phosphatase 123 (*) 39 - 117 U/L   Total Bilirubin 0.7  0.3 - 1.2 mg/dL   GFR calc non Af Amer 7 (*) >90 mL/min   GFR calc Af Amer 8 (*) >90 mL/min    Signed: Carlynn Purl, DO 07/04/2013, 3:50 PM  [facility] PGY1 Pager [phone] Time Spent on Discharge: 50 minutes Services Ordered on Discharge: none Equipment Ordered on Discharge: none

## 2013-07-04 NOTE — Progress Notes (Signed)
I have seen and examined this patient and agree with the plan of care . Measure INR in dialysis TTS. Coumadin 5mg   Check INR on Thursday at [facility]. Goal 2-3 x 3 month Malene Blaydes W 07/04/2013, 1:01 PM

## 2013-07-04 NOTE — Progress Notes (Signed)
[facility] Progress Note  Subjective:   Non-productive cough. Left arm pain and swelling. INR therapeutic x 1.  Objective Filed Vitals:   07/03/13 1753 07/03/13 2103 07/04/13 0440 07/04/13 1006  BP: 141/94 142/97 114/75 137/99  Pulse: 91 98 93 84  Temp: 98.5 F (36.9 C) 98.3 F (36.8 C) 98.1 F (36.7 C) 98.1 F (36.7 C)  TempSrc: Oral Oral Oral   Resp: 20 20 20 22   Height:      Weight:  106.2 kg (234 lb 2.1 oz)    SpO2: 97% 100% 99% 97%   Physical Exam General: alert, cooperative, NAD  Heart: Irregular, no murmur or rub appreciated Lungs: CTA Bilat, no wheezes, rales or rhonchi Abdomen: Soft, NT, non-distended, +BS Extremities: +LE edema (L > R) Dialysis Access: Rt I-J PC, maturing left BVT + bruit, noted swelling to entire LUE, incisions clean/dry with dermabond  Dialysis Orders: AF on TTS .  4.5hrs 101.5kg 2K/2.25Ca Heparin Rt I-J PC Profile 2  [name] 1 mcg IV/HD Epogen 4800 Units IV/HD Venofer 0  Maturing L BVT placed 10/20 by Dr Darrick Penna  Labs: Hgb 9.7, P 3.4, Tsat 17%, Ferritin >5000 in Sept, PTH 100   Assessment/Plan:  1. Syncope - Known atrial flutter. Trop neg x 1. Left BVT placed 10/20. CT angio cannot exclude small pulmonary embolus. Blood cultures negative to date. Cards planning event monitor after d/c 2. Acute/subacute DVT in right post tibial vein / possible small PE by CT- anticoag per pharmacy- INR therapeutic x 1 today today. Recheck planned in the a.m. 3. HD access - HD via PC. New L BVT with LUA pain and swelling; VVS has evaluated stable and will see him prn this admit with plans for op follow-up in 2-3 weeks 4. ESRD - TTS - K+ 4.3. HD tomorrow 5. Hypertension/volume - controlled on amlodipine. Small right pleural effusion by CT. Net UF 3.1 L Saturday. Pre and post HD wgts don't make sense. Sitting up on side of the bed for long periods of time - some dependent LE edema. Advised to elevate legs and ambulate when possible. UF 4L tomorrow as  tolerated 6. Anemia - Hgb 8.5 Continue Aranesp 40 q tues. Last Tsat 17%. Ferritin 989 this admit. FOBT negative. Continue IV Fe load through 11/11 for a total of 8 doses. Will repeat ferritin with Nov labs at op dialysis center. 7. Metabolic bone disease - Ca 9.9 (10.5 corrected). Phos 3.1 on Renvela 3 ac (lowered this admit). Continue Vit D. Low Ca bath with next HD. May need a bath change at discharge. 8. Nutrition - Renal diet, multivitamin, nepro 9. Elevated LFTs - New since last checked in August; also noted to have a ferritin >5000 when last checked at dialysis. Repeat ferritin 989 and LFTs trending down this admit. Hepb B s Ag neg and Hep C neg in August. Follow for now - unclear what prompted spike in LFTs 10. Thrombocytopenia - resolved - in normal range  Clydie Braun E. Thad Ranger [facility] Pager [phone] 07/04/2013,10:57 AM  LOS: 6 days    Additional Objective Labs: Basic Metabolic Panel:  Recent Labs Lab 06/29/13 0545 06/30/13 0430 07/02/13 0700 07/04/13 0540  NA 138 132* 133* 132*  K 4.1 3.9 4.6 4.3  CL 98 94* 96 95*  CO2 29 24 25 24   GLUCOSE 95 90 85 80  BUN 26* 33* 30* 33*  CREATININE 6.48* 8.09* 7.97* 7.73*  CALCIUM 9.2 9.3 9.9 9.9  PHOS 3.2 2.4 3.1  --  Liver Function Tests:  Recent Labs Lab 06/29/13 0545 06/30/13 0430 07/01/13 0543 07/02/13 0700 07/04/13 0540  AST 183* 101* 69*  --  35  ALT 146* 115* 99*  --  45  ALKPHOS 112 109  --   --  123*  BILITOT 0.6 0.5  --   --  0.7  PROT 7.1 7.1  --   --  7.4  ALBUMIN 3.1*  3.3* 3.0*  3.1*  --  3.3* 3.2*   CBC:  Recent Labs Lab 06/28/13 1120  06/30/13 0430 07/01/13 0543  07/02/13 0550 07/03/13 0630 07/04/13 0540  WBC 5.1  < > 3.7* 3.3*  --  4.4 4.9 4.7  NEUTROABS 3.9  --   --   --   --   --   --   --   HGB 9.3*  < > 8.5* 9.0*  < > 9.0* 9.0* 8.5*  HCT 28.4*  < > 26.2* 27.7*  < > 27.4* 27.3* 25.2*  MCV 81.1  < > 81.4 82.2  --  81.5 81.5 81.0  PLT 138*  < > 144* 161  --  160 163  150  < > = values in this interval not displayed. Blood Culture    Component Value Date/Time   SDES BLOOD RIGHT FOREARM 06/28/2013 0017   SPECREQUEST BOTTLES DRAWN AEROBIC ONLY 7CC 06/28/2013 0017   CULT  Value:        BLOOD CULTURE RECEIVED NO GROWTH TO DATE CULTURE WILL BE HELD FOR 5 DAYS BEFORE ISSUING A FINAL NEGATIVE REPORT Performed at [facility] 06/28/2013 0017   REPTSTATUS PENDING 06/28/2013 0017    Cardiac Enzymes:  Recent Labs Lab 06/28/13 0012 06/28/13 1120 06/29/13 0545 06/29/13 1034  TROPONINI <0.30 <0.30 <0.30 <0.30   Medications:   . amLODipine  10 mg Oral Daily  . darbepoetin (ARANESP) injection - DIALYSIS  40 mcg Intravenous Q Tue-HD  . doxercalciferol  1 mcg Intravenous Q T,Th,Sa-HD  . feeding supplement (NEPRO CARB STEADY)  237 mL Oral TID BM  . ferric gluconate (FERRLECIT/NULECIT) IV  125 mg Intravenous Q T,Th,Sa-HD  . labetalol  200 mg Oral BID  . multivitamin  1 tablet Oral QHS  . sevelamer carbonate  2,400 mg Oral TID WC  . sodium chloride  3 mL Intravenous Q12H  . Warfarin - Pharmacist Dosing Inpatient   Does not apply [phone]

## 2013-07-04 NOTE — Progress Notes (Signed)
ANTICOAGULATION CONSULT NOTE - Follow Up Consult  Pharmacy Consult for heparin Indication: atrial fibrillation and DVT/?PE  Labs:  Recent Labs  07/02/13 0550 07/02/13 0700 07/03/13 0630 07/04/13 0540  HGB 9.0*  --  9.0* 8.5*  HCT 27.4*  --  27.3* 25.2*  PLT 160  --  163 150  LABPROT 16.4*  --  20.6* 26.2*  INR 1.36  --  1.83* 2.50*  HEPARINUNFRC 0.42  --  0.41 0.22*  CREATININE  --  7.97*  --   --     Assessment: 62yo male now subtherapeutic on heparin after several days at goal; INR now >2 though expected to stay on heparin with 2 days of therapeutic overlap.  Goal of Therapy:  Heparin level 0.3-0.7 units/ml   Plan:  Will increase heparin gtt by 2 units/kg/hr to 2600 units/hr and check level in 8hr.  Vernard Gambles, PharmD, BCPS  07/04/2013,6:56 AM

## 2013-07-04 NOTE — Telephone Encounter (Addendum)
Message copied by Fredrich Birks on Mon Jul 04, 2013  4:11 PM ------      Message from: Melene Plan      Created: Mon Jul 04, 2013 10:14 AM      Regarding: FW: charge and f/u                   ----- Message -----         From: Chuck Hint, MD         Sent: 07/03/2013   8:18 AM           To: Reuel Derby, Melene Plan, RN, #      Subject: charge and f/u                                           Level 1 f/u visit today.            Needs a f/u visit to check the swelling in his arm with Dr Darrick Penna in 2-3 weeks.      Thanks      CD ------  07/04/13: spoke with pt- he had me move his appt earlier b/c he has dialysis, dpm

## 2013-07-04 NOTE — Discharge Summary (Signed)
  Date: 07/04/2013  Patient name: Trevor [name]  Medical record number: [id number]  Date of birth: 08/17/1951   This patient has been seen and the plan of care was discussed with the house staff. Please see their note for complete details. I concur with their findings and plan.  Jonah Blue, DO, FACP [facility] Internal Medicine Residency Program 07/04/2013, 9:07 PM

## 2013-07-04 NOTE — Progress Notes (Signed)
Subjective: Patient reports LUE pain is mostly well controlled with Norco, swelling unchanged.  Wants to go home.  No acute complaints., Afib on telemetry. Objective: Vital signs in last 24 hours: Filed Vitals:   07/03/13 1314 07/03/13 1753 07/03/13 2103 07/04/13 0440  BP: 123/84 141/94 142/97 114/75  Pulse: 79 91 98 93  Temp: 98 F (36.7 C) 98.5 F (36.9 C) 98.3 F (36.8 C) 98.1 F (36.7 C)  TempSrc: Oral Oral Oral Oral  Resp: 20 20 20 20   Height:      Weight:   234 lb 2.1 oz (106.2 kg)   SpO2: 99% 97% 100% 99%   Weight change: 8 lb 13.1 oz (4 kg)  Intake/Output Summary (Last 24 hours) at 07/04/13 0829 Last data filed at 07/03/13 1833  Gross per 24 hour  Intake    966 ml  Output      0 ml  Net    966 ml   Vitals reviewed.  General: resting in bed, NAD, very pleasant Cardiac: Irregularly irregular  Pulm: clear to auscultation bilaterally  Abd: soft, nontender, nondistended, BS present  Ext: LAVF + palpable thrill, mild tenderness, swelling unchanged Neuro: alert and oriented X3  Lab Results: Basic Metabolic Panel:  Recent Labs Lab 06/30/13 0430 07/02/13 0700 07/04/13 0540  NA 132* 133* 132*  K 3.9 4.6 4.3  CL 94* 96 95*  CO2 24 25 24   GLUCOSE 90 85 80  BUN 33* 30* 33*  CREATININE 8.09* 7.97* 7.73*  CALCIUM 9.3 9.9 9.9  PHOS 2.4 3.1  --    Liver Function Tests:  Recent Labs Lab 06/30/13 0430 07/01/13 0543 07/02/13 0700 07/04/13 0540  AST 101* 69*  --  35  ALT 115* 99*  --  45  ALKPHOS 109  --   --  123*  BILITOT 0.5  --   --  0.7  PROT 7.1  --   --  7.4  ALBUMIN 3.0*  3.1*  --  3.3* 3.2*   CBC:  Recent Labs Lab 06/28/13 1120  07/03/13 0630 07/04/13 0540  WBC 5.1  < > 4.9 4.7  NEUTROABS 3.9  --   --   --   HGB 9.3*  < > 9.0* 8.5*  HCT 28.4*  < > 27.3* 25.2*  MCV 81.1  < > 81.5 81.0  PLT 138*  < > 163 150  < > = values in this interval not displayed. Cardiac Enzymes:  Recent Labs Lab 06/28/13 1120 06/29/13 0545 06/29/13 1034   TROPONINI <0.30 <0.30 <0.30   Thyroid Function Tests:  Recent Labs Lab 06/30/13 0728  TSH 1.942   Coagulation:  Recent Labs Lab 07/01/13 0543 07/02/13 0550 07/03/13 0630 07/04/13 0540  LABPROT 15.1 16.4* 20.6* 26.2*  INR 1.22 1.36 1.83* 2.50*   Anemia Panel:  Recent Labs Lab 06/30/13 0728  FERRITIN 989*   Urine Drug Screen: Drugs of Abuse     Component Value Date/Time   LABOPIA NONE DETECTED 12/11/2007 2233   COCAINSCRNUR NONE DETECTED 12/11/2007 2233   LABBENZ NONE DETECTED 12/11/2007 2233   AMPHETMU NONE DETECTED 12/11/2007 2233   THCU NONE DETECTED 12/11/2007 2233   LABBARB  Value: NONE DETECTED        DRUG SCREEN FOR MEDICAL PURPOSES ONLY.  IF CONFIRMATION IS NEEDED FOR ANY PURPOSE, NOTIFY LAB WITHIN 5 DAYS. 12/11/2007 2233     Micro Results: Recent Results (from the past 240 hour(s))  CULTURE, BLOOD (ROUTINE X 2)     Status: None  Collection Time    06/28/13 12:12 AM      Result Value Range Status   Specimen Description BLOOD RIGHT HAND   Final   Special Requests BOTTLES DRAWN AEROBIC ONLY 10CC   Final   Culture  Setup Time     Final   Value: 06/29/2013 05:55     Performed at [facility]   Culture     Final   Value:        BLOOD CULTURE RECEIVED NO GROWTH TO DATE CULTURE WILL BE HELD FOR 5 DAYS BEFORE ISSUING A FINAL NEGATIVE REPORT     Performed at [facility]   Report Status PENDING   Incomplete  CULTURE, BLOOD (ROUTINE X 2)     Status: None   Collection Time    06/28/13 12:17 AM      Result Value Range Status   Specimen Description BLOOD RIGHT FOREARM   Final   Special Requests BOTTLES DRAWN AEROBIC ONLY [facility]   Final   Culture  Setup Time     Final   Value: 06/29/2013 05:55     Performed at [facility]   Culture     Final   Value:        BLOOD CULTURE RECEIVED NO GROWTH TO DATE CULTURE WILL BE HELD FOR 5 DAYS BEFORE ISSUING A FINAL NEGATIVE REPORT     Performed at [facility]   Report Status PENDING   Incomplete    Studies/Results: No results found. Medications: I have reviewed the patient's current medications. Scheduled Meds: . amLODipine  10 mg Oral Daily  . darbepoetin (ARANESP) injection - DIALYSIS  40 mcg Intravenous Q Tue-HD  . doxercalciferol  1 mcg Intravenous Q T,Th,Sa-HD  . feeding supplement (NEPRO CARB STEADY)  237 mL Oral TID BM  . ferric gluconate (FERRLECIT/NULECIT) IV  125 mg Intravenous Q T,Th,Sa-HD  . labetalol  200 mg Oral BID  . multivitamin  1 tablet Oral QHS  . sevelamer carbonate  2,400 mg Oral TID WC  . sodium chloride  3 mL Intravenous Q12H  . Warfarin - Pharmacist Dosing Inpatient   Does not apply q1800   Continuous Infusions: . heparin 2,600 Units/hr (07/04/13 0701)   PRN Meds:.sodium chloride, acetaminophen, HYDROcodone-acetaminophen, ondansetron, sodium chloride Assessment/Plan: #Syncope-- suspicious for cardiac etiology, r/o prolonged effects of anesthesia -Monitor on telemetry- moderate Afib control -30 day continuous monitor at discharge- if normal consider Loop -Follow up with [name] after discharge  #Small Hematoma in Left Antecubital fossa, S/P LUE fistula transposition on 06/27/13,  -Norco PRN for pain  -Apply ice to area  -Hgb Stable at 8.5 today.  -F/U with Dr Darrick Penna in 2-3 weeks #Possible small pulmonary embolism and Acute/Subacute right DVT-  - Continue IV heparin per pharmacy and transition to coumadin. INR theraputic today. -Per guidelines patient is to continue bridging till two INRs are in therapeutic range.  However patient is medically stable for discharge, would like to go home, and has follow up tomorrow at dialysis.  Spoke with Dr. Hyman Hopes who reports they will be able to check his INR at dialysis tomorrow and adjust warfarin.  Patient is OK with this plan.  Will discharge home with 5mg  of warfarin QD continuing dose.  Will not give single lovenox dose today to ensure bridging since patient is ESRD and has a small LUE hematoma. #Atrial  fibrillation--CHADS2VASC score of 1  -Patient on Heparin and transitioning to Coumadin for PE/DVT. INR 2.5 this AM.  -Continue  labetalol  #ESRD (end stage renal disease) on dialysis--[facility]Th[facility]. Follows with Dr. Arrie Aran.  -Appreciate renal following  -Renal diet  -Trend renal function  #Anemia-- -Will continue to monitor for signs of bleeding at LUE fistula site. Hgb 9.0 this AM  -Aranesp per Nephro -Ferritin 989, given IV Fe per Nephrology #HYPERTENSION  -Continue home labetalol and amlodipine  - Patient at estimated dry weight #Prolonged QT--qtc 530 on EKG 06/28/13  -Avoid QT prolonging agents  #Elevated liver enzymes -Trending down, Hgb B and C Ag neg in August per nephrology, Ferritin has decreased from >5000 in August to 989 this admission. Unlikely Iron overload.   #DVTPPx: Coumadin Dispo: Patient with Therapeutic INR will discharge home today.   The patient does have a current PCP Irena Cords, MD) and does not need an [facility] hospital follow-up appointment after discharge.  The patient does not have transportation limitations that hinder transportation to clinic appointments.   .Services Needed at time of discharge: Y = Yes, Blank = No PT:   OT:   RN:   Equipment:   Other:     LOS: 6 days   Carlynn Purl, DO 07/04/2013, 8:29 AM

## 2013-07-04 NOTE — Progress Notes (Signed)
Patient discharge teaching given, including activity, diet, follow-up appoints, and medications. Patient verbalized understanding of all discharge instructions. IV access was d/c'd. Vitals are stable. Skin is intact except as charted in most recent assessments. Pt to be escorted out by NT, to be driven home by family.  Marina Boerner, MBA, BS, RN 

## 2013-07-04 NOTE — Progress Notes (Signed)
  Date: 07/04/2013  Patient name: Trevor [name]  Medical record number: [id number]  Date of birth: October 01, 1950   This patient has been seen and the plan of care was discussed with the house staff. Please see their note for complete details. I concur with their findings with the following additions/corrections: Patient will need 30 day outpatient cardiac ambulatory monitor. His INR is therapeutic.  Appreciate vascular input into left arm swelling and small hematoma. At this point, I wouldn't feel comfortable giving him Lovenox to continue overlap as he is an ESRD patient. Ok to continue coumadin and have this followed up as outpatient. His LUE has intact distal pulses, motor, sensory findings also intact. Medically stable for D/C.  Jonah Blue, DO, FACP Faculty [facility] Internal Medicine Residency Program 07/04/2013, 11:48 AM

## 2013-07-04 NOTE — Progress Notes (Signed)
       Patient Name: Trevor [name] Date of Encounter: 07/04/2013    SUBJECTIVE: Now ambulatory. No recurrence of syncope.   TELEMETRY:  Continuous atrial fib with borderline rate control. Filed Vitals:   07/03/13 1314 07/03/13 1753 07/03/13 2103 07/04/13 0440  BP: 123/84 141/94 142/97 114/75  Pulse: 79 91 98 93  Temp: 98 F (36.7 C) 98.5 F (36.9 C) 98.3 F (36.8 C) 98.1 F (36.7 C)  TempSrc: Oral Oral Oral Oral  Resp: 20 20 20 20   Height:      Weight:   234 lb 2.1 oz (106.2 kg)   SpO2: 99% 97% 100% 99%    Intake/Output Summary (Last 24 hours) at 07/04/13 0948 Last data filed at 07/04/13 0700  Gross per 24 hour  Intake   1368 ml  Output      0 ml  Net   1368 ml    LABS: Basic Metabolic Panel:  Recent Labs  78/29/56 0700 07/04/13 0540  NA 133* 132*  K 4.6 4.3  CL 96 95*  CO2 25 24  GLUCOSE 85 80  BUN 30* 33*  CREATININE 7.97* 7.73*  CALCIUM 9.9 9.9  PHOS 3.1  --    CBC:  Recent Labs  07/03/13 0630 07/04/13 0540  WBC 4.9 4.7  HGB 9.0* 8.5*  HCT 27.3* 25.2*  MCV 81.5 81.0  PLT 163 150   ASSESSMENT:  1. Syncope without recurrence.  Plan:  1. Have arranged for 30 day OP ambulatory monitor. He should f/u with [name] once discharged. 2. Will sign off for now.  Selinda Eon 07/04/2013, 9:48 AM\

## 2013-07-05 LAB — CULTURE, BLOOD (ROUTINE X 2): Culture: NO GROWTH

## 2013-07-06 ENCOUNTER — Encounter: Payer: Self-pay | Admitting: Cardiology

## 2013-07-06 ENCOUNTER — Encounter (INDEPENDENT_AMBULATORY_CARE_PROVIDER_SITE_OTHER): Payer: Medicare Other

## 2013-07-06 DIAGNOSIS — R55 Syncope and collapse: Secondary | ICD-10-CM

## 2013-07-07 ENCOUNTER — Ambulatory Visit (INDEPENDENT_AMBULATORY_CARE_PROVIDER_SITE_OTHER): Payer: Self-pay | Admitting: Vascular Surgery

## 2013-07-07 ENCOUNTER — Encounter: Payer: Self-pay | Admitting: Vascular Surgery

## 2013-07-07 VITALS — BP 109/76 | HR 99 | Resp 18 | Ht 73.5 in | Wt 224.4 lb

## 2013-07-07 DIAGNOSIS — N186 End stage renal disease: Secondary | ICD-10-CM

## 2013-07-07 DIAGNOSIS — I4891 Unspecified atrial fibrillation: Secondary | ICD-10-CM | POA: Diagnosis not present

## 2013-07-07 MED ORDER — HYDROCODONE-ACETAMINOPHEN 10-300 MG PO TABS: 2.0000 | ORAL_TABLET | Freq: Four times a day (QID) | ORAL | Status: DC | PRN

## 2013-07-07 NOTE — Progress Notes (Signed)
Patient is a 62 year old male status post left basilic vein transposition fistula on October 20. He came to the office today complaining of pain and swelling in his left arm. He denies numbness or tingling in the hand. He states the swelling does improve somewhat when he elevates his arm on dialysis. He is currently dialyzing via a right-sided catheter.  Physical exam:  Filed Vitals:   07/07/13 1421  BP: 109/76  Pulse: 99  Resp: 18  Height: 6' 1.5" (1.867 m)  Weight: 224 lb 6.4 oz (101.787 kg)    Diffuse edema left upper extremity from the shoulder into the left hand. Left hand is pink warm well perfused absent radial pulse easily palpable thrill in fistula all incisions intact  Assessment: Left upper extremity swelling approximately one week status post basilic vein transposition. Hopefully this will improve somewhat time. This is most likely secondary to disruption of venous collaterals during his basilic vein transposition. He could possibly have a central vein stenosis as well.  Plan: Vicodin 10/300 #60 dispensed today, Ace wrap left hand and forearm, followup one month consider central venogram of swelling in improved. I told the patient that we could give him one additional prescription of Vicodin to last him for that full month if necessary.  Fabienne Bruns, MD [facility] Office: [phone] Pager: [phone]

## 2013-07-08 ENCOUNTER — Telehealth: Payer: Self-pay | Admitting: General Surgery

## 2013-07-08 DIAGNOSIS — N186 End stage renal disease: Secondary | ICD-10-CM | POA: Diagnosis not present

## 2013-07-08 NOTE — Telephone Encounter (Signed)
Please have patient's INR followed in our office since he will need to be cardioverted

## 2013-07-08 NOTE — Telephone Encounter (Signed)
Message copied by Nita Sells on Fri Jul 08, 2013  4:08 PM ------      Message from: [name]      Created: Fri Jul 08, 2013  8:49 AM       Please find out who is following this patients PT/INRs.  He is on coumadin and is not being followed here.  ALso needs followup with me sooner than December ------

## 2013-07-08 NOTE — Telephone Encounter (Signed)
Pt has had his coumadin level checked at his dialysis center. Pt states he has only had his pt/inr checked once since he has been out of the hospital. To [name] to advise. Once you advise I will see about getting pt in sooner with you.

## 2013-07-09 DIAGNOSIS — N2581 Secondary hyperparathyroidism of renal origin: Secondary | ICD-10-CM | POA: Diagnosis not present

## 2013-07-09 DIAGNOSIS — D509 Iron deficiency anemia, unspecified: Secondary | ICD-10-CM | POA: Diagnosis not present

## 2013-07-09 DIAGNOSIS — N186 End stage renal disease: Secondary | ICD-10-CM | POA: Diagnosis not present

## 2013-07-09 DIAGNOSIS — D631 Anemia in chronic kidney disease: Secondary | ICD-10-CM | POA: Diagnosis not present

## 2013-07-11 NOTE — Telephone Encounter (Signed)
To Riki Rusk to set pt up with him in coumadin clinic.

## 2013-07-11 NOTE — Telephone Encounter (Signed)
Warfarin started in hospital on 10/22.  Patient recently discharged from hospital on 07/04/13 with INR of 2.5.  He was sent home on warfarin 5 mg qd for recent h/o AFib and DVT (right leg).  [name] would like to have him cardioverted in near future.  We will see him weekly until he f/u with [name].  His INR at dialysis was 2.6 per patient on 07/07/13.  He goes to HD on T/Th/Sat.  Patient scheduled to see [place] for PT/INR on 07/13/13.  He is to continue warfarin 5 mg qd until 07/13/13, then continue with weekly INRs until [name] appt.    Danielle, please contact patient if [name] wants him to come back before 08/08/13 appt.

## 2013-07-11 NOTE — Telephone Encounter (Signed)
Pt is r/s for 11/10 at 10:30 with Dr/ Mayford Knife for Hospital F/U per TT to reschedule

## 2013-07-11 NOTE — Telephone Encounter (Signed)
To TT as a [place]

## 2013-07-13 ENCOUNTER — Ambulatory Visit: Payer: Medicare Other | Admitting: Gastroenterology

## 2013-07-13 ENCOUNTER — Ambulatory Visit (INDEPENDENT_AMBULATORY_CARE_PROVIDER_SITE_OTHER): Payer: Medicare Other | Admitting: Pharmacist

## 2013-07-13 DIAGNOSIS — I4891 Unspecified atrial fibrillation: Secondary | ICD-10-CM | POA: Diagnosis not present

## 2013-07-13 LAB — POCT INR: INR: 1.7

## 2013-07-14 ENCOUNTER — Encounter: Payer: Self-pay | Admitting: Cardiology

## 2013-07-14 DIAGNOSIS — I4891 Unspecified atrial fibrillation: Secondary | ICD-10-CM | POA: Diagnosis not present

## 2013-07-15 ENCOUNTER — Encounter: Payer: Self-pay | Admitting: Gastroenterology

## 2013-07-15 ENCOUNTER — Ambulatory Visit (INDEPENDENT_AMBULATORY_CARE_PROVIDER_SITE_OTHER): Payer: Medicare Other | Admitting: Gastroenterology

## 2013-07-15 VITALS — BP 136/84 | HR 88 | Ht 71.0 in | Wt 228.1 lb

## 2013-07-15 DIAGNOSIS — R05 Cough: Secondary | ICD-10-CM | POA: Diagnosis not present

## 2013-07-15 DIAGNOSIS — R160 Hepatomegaly, not elsewhere classified: Secondary | ICD-10-CM | POA: Diagnosis not present

## 2013-07-15 DIAGNOSIS — R109 Unspecified abdominal pain: Secondary | ICD-10-CM | POA: Diagnosis not present

## 2013-07-15 MED ORDER — DEXLANSOPRAZOLE 60 MG PO CPDR: 60.0000 mg | DELAYED_RELEASE_CAPSULE | Freq: Two times a day (BID) | ORAL | Status: DC

## 2013-07-15 NOTE — Patient Instructions (Addendum)
You have been scheduled for a gastric emptying scan at  [facility]   on  11/26  At  10am    . Please arrive at least 15 minutes prior to your appointment for registration. Please make certain not to have anything to eat or drink after midnight the night before your test. Hold all stomach medications (ex: Zofran, phenergan, Reglan) 48 hours prior to your test. If you need to reschedule your appointment, please contact radiology scheduling at [phone]. _____________________________________________________________________ A gastric-emptying study measures how long it takes for food to move through your stomach. There are several ways to measure stomach emptying. In the most common test, you eat food that contains a small amount of radioactive material. A scanner that detects the movement of the radioactive material is placed over your abdomen to monitor the rate at which food leaves your stomach. This test normally takes about 2 hours to complete. _____________________________________________________________________   Trevor [name] have been scheduled for an abdominal ultrasound at [facility] (1st floor of hospital) on  08/03/2013   at    9am. Please arrive 15 minutes prior to your appointment for registration. Make certain not to have anything to eat or drink 6 hours prior to your appointment. Should you need to reschedule your appointment, please contact radiology at [phone]. This test typically takes about 30 minutes to perform.

## 2013-07-15 NOTE — Assessment & Plan Note (Signed)
He appears to have hepatomegaly by exam.  Note recent liver tests were normal.  Hepatomegaly could be related to mild passive congestion of the liver.  Recommendations #1 abdominal ultrasound

## 2013-07-15 NOTE — Assessment & Plan Note (Addendum)
Persistent nonproductive cough unresponsive to PPI or H2 receptor antagonist therapy.  He also complains of bloating and nausea which raises the question of gastroparesis.  Coughing could be do to cyclical coughing.  Silent acid reflux remains a possibility.  I doubt coughing is related to mild congestive heart failure.  Recommendations #1 empiric trial of dexilant 60 mg before breakfast and dinner; patient was instructed to call back in 5 days to report progress.  If he's not improved then I will proceed with upper endoscopy to evaluate for erosive changes.  If none are seen I will place a bravo 48-hour pH probe to document whether he is having significant acid reflux #2 gastric emptying scan

## 2013-07-15 NOTE — Progress Notes (Signed)
History of Present Illness: 62 year old Afro-American male with end-stage renal disease, on dialysis, history of congestive heart failure, atrial flutter and fibrillation, on Coumadin, referred for evaluation of a chronic cough.  For the last 1-2 months he's been plagued by a chronic nonproductive cough.  This occurs anytime during the day or night.  He has occasional pyrosis.  He's tried various acid blockers including PPIs without improvement.  He denies dysphagia.  He also complains of bloating and some nausea.  With coughing he's had mild sore throat and hoarseness.  Chest x-ray from one month ago demonstrated congestive heart failure and a minimal pleural effusion.  He was evaluated by pulmonary in September, 2014 where he was placed on an H2 receptor antagonists, without success.  Colonoscopy in 2007 demonstrated diverticulosis.  Past Medical History  Diagnosis Date  . Anemia   . AVF (arteriovenous fistula)     Left  . Secondary hyperparathyroidism   . Hypovitaminosis D   . Hypertensive urgency     H/o  . CHF (congestive heart failure)   . Exertional shortness of breath     "related to infection in my lungs right now" (05/03/2013)  . History of gout     "before I started doing the dialysis" (05/03/2013)  . ESRD (end stage renal disease) on dialysis     "[facility]; MWF" (05/03/2013)  . Dysrhythmia     Hx: aflutter  . Sleep apnea   . GERD (gastroesophageal reflux disease)   . Headache(784.0)   . Atrial fibrillation    Past Surgical History  Procedure Laterality Date  . Av fistula placement Left     Dr. Charlean Sanfilippo; "I've had 2 on the left' (05/03/2013)  . Av fistula placement Right ~ 2011  . Knee arthroscopy Left   . Avgg removal Right 05/04/2013    Procedure: REMOVAL OF ARTERIOVENOUS Fistula Right Arm;  Surgeon: [name], MD;  Location: [facility] OR;  Service: Vascular;  Laterality: Right;  . Insertion of dialysis catheter Right 05/04/2013    Procedure: INSERTION OF DIALYSIS  CATHETER;  Surgeon: [name], MD;  Location: [facility] OR;  Service: Vascular;  Laterality: Right;  . Colonoscopy      Hx: of  . Bascilic vein transposition Left 06/27/2013    Procedure: BASCILIC VEIN TRANSPOSITION;  Surgeon: [name], MD;  Location: [facility] OR;  Service: Vascular;  Laterality: Left;   family history includes Allergies in his father; Deep vein thrombosis in his sister; Diabetes in his paternal grandmother; Hypertension in his father; Kidney disease in his father; Pulmonary embolism in his sister. Current Outpatient Prescriptions  Medication Sig Dispense Refill  . amLODipine (NORVASC) 10 MG tablet Take 10 mg by mouth daily.        . Arginine 1000 MG TABS Take 2,000 mg by mouth daily as needed (for vitamin).       Marland Kitchen labetalol (NORMODYNE) 200 MG tablet Take 200 mg by mouth 2 (two) times daily.      . sevelamer carbonate (RENVELA) 800 MG tablet Take 3,200 mg by mouth as needed (for kidneys).       . warfarin (COUMADIN) 5 MG tablet Take 1 tablet (5 mg total) by mouth daily.  30 tablet  0   No current facility-administered medications for this visit.   Allergies as of 07/15/2013  . (No Known Allergies)    reports that he quit smoking about 40 years ago. His smoking use included Cigarettes. He has a .125 pack-year smoking history. He  has never used smokeless tobacco. He reports that he drinks alcohol. He reports that he does not use illicit drugs.     Review of Systems: Pertinent positive and negative review of systems were noted in the above HPI section. All other review of systems were otherwise negative.  Vital signs were reviewed in today's medical record Physical Exam: General: Well developed , well nourished, no acute distress Skin: anicteric Head: Normocephalic and atraumatic Eyes:  sclerae anicteric, EOMI Ears: Normal auditory acuity Mouth: No deformity or lesions Neck: Supple, no masses or thyromegaly Lungs: Clear throughout to auscultation Heart: Regular  rate and rhythm; no rubs or bruits.  There is a 2/6 early systolic murmur heard best at the left sternal border Abdomen: Soft, non tender and non distended. No masses,  or hernias noted. Normal Bowel sounds.  Liver is palpable 2-3 fingerbreadths below the right costal margin and is firm and nonnodular there no bruits.  There is no succussion splash Rectal:deferred Musculoskeletal: Symmetrical with no gross deformities  Skin: No lesions on visible extremities Pulses:  Normal pulses noted Extremities: No clubbing, cyanosis,or deformities noted.  There is an AV fistula in the right upper extremity and new AV fistula in the left upper extremity.  The left upper extremity is diffusely swollen Neurological: Alert oriented x 4, grossly nonfocal Cervical Nodes:  No significant cervical adenopathy Inguinal Nodes: No significant inguinal adenopathy Psychological:  Alert and cooperative. Normal mood and affect

## 2013-07-18 ENCOUNTER — Ambulatory Visit (INDEPENDENT_AMBULATORY_CARE_PROVIDER_SITE_OTHER): Payer: Medicare Other | Admitting: Pharmacist

## 2013-07-18 ENCOUNTER — Encounter: Payer: Self-pay | Admitting: Cardiology

## 2013-07-18 ENCOUNTER — Ambulatory Visit (INDEPENDENT_AMBULATORY_CARE_PROVIDER_SITE_OTHER): Payer: Medicare Other | Admitting: Cardiology

## 2013-07-18 VITALS — BP 138/92 | HR 86 | Ht 73.0 in | Wt 232.0 lb

## 2013-07-18 DIAGNOSIS — I4891 Unspecified atrial fibrillation: Secondary | ICD-10-CM

## 2013-07-18 DIAGNOSIS — R55 Syncope and collapse: Secondary | ICD-10-CM | POA: Diagnosis not present

## 2013-07-18 DIAGNOSIS — I2699 Other pulmonary embolism without acute cor pulmonale: Secondary | ICD-10-CM | POA: Insufficient documentation

## 2013-07-18 DIAGNOSIS — I10 Essential (primary) hypertension: Secondary | ICD-10-CM | POA: Diagnosis not present

## 2013-07-18 LAB — POCT INR: INR: 2

## 2013-07-18 NOTE — Patient Instructions (Signed)
Trevor [name] will let me know when you are therapeutic on coumadin for 4 weeks. Then we can move forward with the cardioversion.

## 2013-07-18 NOTE — Progress Notes (Signed)
[address] [place], [place]  [address] Phone: [phone] Fax:  [phone]  Date:  07/18/2013   ID:  Trevor [name], DOB 1951/05/30, MRN [id number]  PCP:  Trevor Maes, MD  Cardiologist:  Trevor Magic, MD     History of Present Illness: Trevor [name] is a 62 year old male with a PMH of ESRD on HD TTHS, A. Fib, HTN, Anemia, Secondary hyperparathyroidism, OSA, classic upper airway cough syndrome (Dx Dr Trevor [name] 06/08/13) He presented to the [facility]  after 4 syncopal episodes . He reported that he passed out 4 times while walking and he reported these were witnessed by his girlfriend Trevor [name]. He does report LOC but doesn't know for how long. He does not believe he had any shaking or tongue biting. He does not know if he hit is head but he denies any head pain. He denied any prodromal symptoms. He denied loss of bowel or bladder. He denied any dizziness on standing. He did report confusion after the episodes. He reported he did feel both of his legs were weak. He denied any palpitations. He did report SOB but this has been on going for the past 2 months and has been associated with a chronic cough for which he has been seen by Dr. Sherene [name] (Pulmonology).  In addition this is not his first syncopal episode. He reports that he has had numerous issues over the past 2 months. He was admitted to [facility] 04/27/13 after a syncopal episode which the patient reported happened during a coughing spell. Workup in the hospital revealed newly diagnosed A fib which was thought to be a possible cause. Cardiology was consulted and recommended coumadin for 4 weeks followed by cardioversion and coumadin for an additional 4 weeks, after that ASA 81mg  would be used for A/C since his CHADS2VASC score was 1. Patient however choose to not take either. He returned to the hospital on 05/02/13 with a fever, and also found to be in Afib with RVR. He was found to have an infection of his right AVF and had to have it removed.  Cultures were negative and he was treated with Vancomycin for 2 weeks.  CTA chest was inconclusive for PE, but  LE DVT showed evidence of DVT in RLE posterior tibial vein. He was started on coumadin.  It was felt that his syncope was due to his Afib with RVR as a result of PE.  He presents back today for followup.  He says that since coming home from the hospital he has been having pressure in his abdomen with a lot of coughing.  He denies any DOE or SOB unless he is coughing.  He has a lot of GERD which causes his cough.  He denies any LE edema, palpitations, dizziness or syncope.      Wt Readings from Last 3 Encounters:  07/15/13 228 lb 2 oz (103.477 kg)  07/07/13 224 lb 6.4 oz (101.787 kg)  07/03/13 234 lb 2.1 oz (106.2 kg)     Past Medical History  Diagnosis Date  . Anemia   . AVF (arteriovenous fistula)     Left  . Secondary hyperparathyroidism   . Hypovitaminosis D   . Hypertensive urgency     H/o  . CHF (congestive heart failure)   . Exertional shortness of breath     "related to infection in my lungs right now" (05/03/2013)  . History of gout     "before I started doing the dialysis" (  05/03/2013)  . ESRD (end stage renal disease) on dialysis     "[facility]; MWF" (05/03/2013)  . Sleep apnea   . GERD (gastroesophageal reflux disease)   . Headache(784.0)   . Syncope     felt secondary to residual anesthesia the day before - 2D echo unremarkable  . Pulmonary embolism     with right DVT secondary to recent surgery  . Dysrhythmia     Hx: aflutter  . Atrial fibrillation     on coumadin  . Prolonged Q-T interval on ECG     Current Outpatient Prescriptions  Medication Sig Dispense Refill  . amLODipine (NORVASC) 10 MG tablet Take 10 mg by mouth daily.        . Arginine 1000 MG TABS Take 2,000 mg by mouth daily as needed (for vitamin).       Marland Kitchen dexlansoprazole (DEXILANT) 60 MG capsule Take 1 capsule (60 mg total) by mouth 2 (two) times daily.  90 capsule  3  . labetalol  (NORMODYNE) 200 MG tablet Take 200 mg by mouth 2 (two) times daily.      . sevelamer carbonate (RENVELA) 800 MG tablet Take 3,200 mg by mouth as needed (for kidneys).       . warfarin (COUMADIN) 5 MG tablet Take 1 tablet (5 mg total) by mouth daily.  30 tablet  0   No current facility-administered medications for this visit.    Allergies:   No Known Allergies  Social History:  The patient  reports that he quit smoking about 40 years ago. His smoking use included Cigarettes. He has a .125 pack-year smoking history. He has never used smokeless tobacco. He reports that he drinks alcohol. He reports that he does not use illicit drugs.   Family History:  The patient's family history includes Allergies in his father; Deep vein thrombosis in his sister; Diabetes in his paternal grandmother; Hypertension in his father; Kidney disease in his father; Pulmonary embolism in his sister.   ROS:  Please see the history of present illness.      All other systems reviewed and negative.   PHYSICAL EXAM: VS:  There were no vitals taken for this visit. Well nourished, well developed, in no acute distress HEENT: normal Neck: no JVD Cardiac:  normal S1, S2; RRR; no murmur Lungs:  clear to auscultation bilaterally, no wheezing, rhonchi or rales Abd: soft, nontender, no hepatomegaly Ext: no edema Skin: warm and dry Neuro:  CNs 2-12 intact, no focal abnormalities noted       ASSESSMENT AND PLAN:  1. Syncope - presumed secondary to afib with RVR +/- PE - he had been wearing a heart monitor but did not tolerate the leads and sent it back.  He has not had any further episodes. 2. Recent DVT with PE on Warfarin 3. Afib with RVR - by exam he is still in afib rate controlled  - continue Labetolol/Warfarin  - once INR is therapeutic for 4 weeks will plan outpt DCCV 4. HTN  - continue Amlodipine/Labetolol 5. Prolonged QTc 6. ESRD  followup with me in 8 weeks ( this will be about 2 weeks after his  DCCV)  Signed, Trevor Magic, MD 07/18/2013 10:43 AM

## 2013-07-20 ENCOUNTER — Encounter: Payer: Self-pay | Admitting: Vascular Surgery

## 2013-07-20 ENCOUNTER — Telehealth: Payer: Self-pay | Admitting: Gastroenterology

## 2013-07-20 NOTE — Telephone Encounter (Signed)
We can wait on scheduling endoscopy

## 2013-07-20 NOTE — Telephone Encounter (Signed)
He should be taking dexilant just twice a day. Schedule him for EGD with possible probable pH study (to be done at the hospital).  I actually am free tomorrow midday.  He should still have a gastric emptying scan as well.

## 2013-07-20 NOTE — Telephone Encounter (Signed)
Pt states the Dexilant he is taking has helped his bloating but not his cough. Pt state he was told to take it BID but pt states he has been taking it TID. Discussed with pt that he is only supposed to take it BID. Dr. Arlyce Dice notified.

## 2013-07-20 NOTE — Telephone Encounter (Signed)
Pt has another appt already scheduled for tomorrow. He is also scheduled for a GES and an abdominal ultrasound. Do you want to wait on those results before scheduling the EGD?

## 2013-07-21 ENCOUNTER — Encounter: Payer: Self-pay | Admitting: Vascular Surgery

## 2013-07-21 ENCOUNTER — Ambulatory Visit (INDEPENDENT_AMBULATORY_CARE_PROVIDER_SITE_OTHER): Payer: Self-pay | Admitting: Vascular Surgery

## 2013-07-21 VITALS — BP 115/91 | HR 96 | Resp 20 | Ht 73.5 in | Wt 225.0 lb

## 2013-07-21 DIAGNOSIS — Z4931 Encounter for adequacy testing for hemodialysis: Secondary | ICD-10-CM

## 2013-07-21 DIAGNOSIS — N186 End stage renal disease: Secondary | ICD-10-CM

## 2013-07-21 MED ORDER — OXYCODONE-ACETAMINOPHEN 5-325 MG PO TABS: 1.0000 | ORAL_TABLET | Freq: Four times a day (QID) | ORAL | Status: DC | PRN

## 2013-07-21 NOTE — Progress Notes (Signed)
Patient is a 62 year old male who is status post creation of a left basilic vein transposition fistula on October 20.  He has considerable swelling in his arm postoperatively. He returns today for followup. The swelling has improved approximately 50%. He still has some pain and swelling in the left elbow.  Physical exam:  Filed Vitals:   07/21/13 1317  BP: 115/91  Pulse: 96  Resp: 20  Height: 6' 1.5" (1.867 m)  Weight: 225 lb (102.059 kg)    Left upper extremity: Healing incisions left upper arm easily palpable thrill audible bruit in fistula. Left hand pink warm  Neck: Right-sided catheter no drainage  Assessment: Improved swelling left upper extremity  Plan: Followup one month with ultrasound the fistula to assess maturity. Patient was given a renewal of his Percocet prescription today. 30 dispensed. He should not need further pain medicine after that.  Fabienne Bruns, MD [facility] Office: [phone] Pager: [phone]

## 2013-07-25 ENCOUNTER — Ambulatory Visit (INDEPENDENT_AMBULATORY_CARE_PROVIDER_SITE_OTHER): Payer: Medicare Other | Admitting: Pharmacist

## 2013-07-25 DIAGNOSIS — I4891 Unspecified atrial fibrillation: Secondary | ICD-10-CM | POA: Diagnosis not present

## 2013-07-25 MED ORDER — WARFARIN SODIUM 5 MG PO TABS: ORAL_TABLET | ORAL | Status: DC

## 2013-08-01 ENCOUNTER — Ambulatory Visit (INDEPENDENT_AMBULATORY_CARE_PROVIDER_SITE_OTHER): Payer: Medicare Other | Admitting: Pharmacist

## 2013-08-01 DIAGNOSIS — I4891 Unspecified atrial fibrillation: Secondary | ICD-10-CM | POA: Diagnosis not present

## 2013-08-01 LAB — POCT INR: INR: 2.2

## 2013-08-03 ENCOUNTER — Encounter: Payer: Self-pay | Admitting: Pharmacist

## 2013-08-03 ENCOUNTER — Ambulatory Visit (HOSPITAL_COMMUNITY)
Admission: RE | Admit: 2013-08-03 | Discharge: 2013-08-03 | Disposition: A | Discharge status: Home / Self Care | Payer: Medicare Other | Source: Ambulatory Visit | Attending: Gastroenterology | Admitting: Gastroenterology

## 2013-08-03 ENCOUNTER — Encounter (HOSPITAL_COMMUNITY)
Admission: RE | Admit: 2013-08-03 | Discharge: 2013-08-03 | Disposition: A | Discharge status: Home / Self Care | Payer: Medicare Other | Source: Ambulatory Visit | Attending: Gastroenterology | Admitting: Gastroenterology

## 2013-08-03 ENCOUNTER — Telehealth: Payer: Self-pay | Admitting: Pharmacist

## 2013-08-03 DIAGNOSIS — N186 End stage renal disease: Secondary | ICD-10-CM | POA: Diagnosis not present

## 2013-08-03 DIAGNOSIS — R109 Unspecified abdominal pain: Secondary | ICD-10-CM

## 2013-08-03 DIAGNOSIS — K7689 Other specified diseases of liver: Secondary | ICD-10-CM | POA: Insufficient documentation

## 2013-08-03 DIAGNOSIS — K824 Cholesterolosis of gallbladder: Secondary | ICD-10-CM | POA: Diagnosis not present

## 2013-08-03 DIAGNOSIS — R143 Flatulence: Secondary | ICD-10-CM | POA: Diagnosis not present

## 2013-08-03 DIAGNOSIS — R141 Gas pain: Secondary | ICD-10-CM | POA: Insufficient documentation

## 2013-08-03 DIAGNOSIS — R6881 Early satiety: Secondary | ICD-10-CM | POA: Insufficient documentation

## 2013-08-03 DIAGNOSIS — I120 Hypertensive chronic kidney disease with stage 5 chronic kidney disease or end stage renal disease: Secondary | ICD-10-CM | POA: Insufficient documentation

## 2013-08-03 DIAGNOSIS — R188 Other ascites: Secondary | ICD-10-CM | POA: Insufficient documentation

## 2013-08-03 DIAGNOSIS — R142 Eructation: Secondary | ICD-10-CM | POA: Insufficient documentation

## 2013-08-03 DIAGNOSIS — K219 Gastro-esophageal reflux disease without esophagitis: Secondary | ICD-10-CM | POA: Insufficient documentation

## 2013-08-03 DIAGNOSIS — N189 Chronic kidney disease, unspecified: Secondary | ICD-10-CM | POA: Diagnosis not present

## 2013-08-03 DIAGNOSIS — N281 Cyst of kidney, acquired: Secondary | ICD-10-CM | POA: Insufficient documentation

## 2013-08-03 DIAGNOSIS — N269 Renal sclerosis, unspecified: Secondary | ICD-10-CM | POA: Insufficient documentation

## 2013-08-03 MED ORDER — TECHNETIUM TC 99M SULFUR COLLOID
2.2000 | Freq: Once | INTRAVENOUS | Status: AC | PRN
  Administered 2013-08-03: 2.2 via INTRAVENOUS

## 2013-08-03 NOTE — Telephone Encounter (Signed)
Letter faxed to number listed below.

## 2013-08-03 NOTE — Telephone Encounter (Signed)
New message    Pt was seen in coumadin clinic 11-24 but forgot to have transportation paper signed.  Can you fax a note to [phone] attn transportation saying that pt was seen on 08-01-13 in our office?  If pt needs to bring form to [place], pls let Misty Stanley know.

## 2013-08-07 DIAGNOSIS — N186 End stage renal disease: Secondary | ICD-10-CM | POA: Diagnosis not present

## 2013-08-08 ENCOUNTER — Other Ambulatory Visit: Payer: Self-pay

## 2013-08-08 ENCOUNTER — Encounter: Payer: Medicare Other | Admitting: Cardiology

## 2013-08-08 ENCOUNTER — Ambulatory Visit (INDEPENDENT_AMBULATORY_CARE_PROVIDER_SITE_OTHER): Payer: Medicare Other | Admitting: Pharmacist

## 2013-08-08 DIAGNOSIS — K7460 Unspecified cirrhosis of liver: Secondary | ICD-10-CM

## 2013-08-08 DIAGNOSIS — I8510 Secondary esophageal varices without bleeding: Secondary | ICD-10-CM

## 2013-08-08 DIAGNOSIS — I4891 Unspecified atrial fibrillation: Secondary | ICD-10-CM | POA: Diagnosis not present

## 2013-08-08 LAB — POCT INR: INR: 2.7

## 2013-08-09 DIAGNOSIS — N2581 Secondary hyperparathyroidism of renal origin: Secondary | ICD-10-CM | POA: Diagnosis not present

## 2013-08-09 DIAGNOSIS — N186 End stage renal disease: Secondary | ICD-10-CM | POA: Diagnosis not present

## 2013-08-09 DIAGNOSIS — D509 Iron deficiency anemia, unspecified: Secondary | ICD-10-CM | POA: Diagnosis not present

## 2013-08-09 DIAGNOSIS — D631 Anemia in chronic kidney disease: Secondary | ICD-10-CM | POA: Diagnosis not present

## 2013-08-10 ENCOUNTER — Telehealth: Payer: Self-pay | Admitting: Cardiology

## 2013-08-10 NOTE — Telephone Encounter (Signed)
Unable to reach pt on phone number provided. Will make aware at 12/8 visit

## 2013-08-10 NOTE — Telephone Encounter (Signed)
Please let patient know that heart monitor showed atrial fibrillation with HR 60-110bpm with predominantly controlled HR with average HR 83bpm

## 2013-08-11 ENCOUNTER — Encounter: Payer: Medicare Other | Admitting: Vascular Surgery

## 2013-08-11 ENCOUNTER — Other Ambulatory Visit (HOSPITAL_COMMUNITY): Payer: Medicare Other

## 2013-08-12 ENCOUNTER — Other Ambulatory Visit (HOSPITAL_COMMUNITY): Payer: Medicare Other

## 2013-08-12 ENCOUNTER — Other Ambulatory Visit (INDEPENDENT_AMBULATORY_CARE_PROVIDER_SITE_OTHER): Payer: Medicare Other

## 2013-08-12 ENCOUNTER — Encounter: Payer: Medicare Other | Admitting: Vascular Surgery

## 2013-08-12 DIAGNOSIS — K7460 Unspecified cirrhosis of liver: Secondary | ICD-10-CM | POA: Diagnosis not present

## 2013-08-12 LAB — IRON: Iron: 28 ug/dL — ABNORMAL LOW (ref 42–165)

## 2013-08-12 LAB — IRON AND TIBC
Iron: 32 ug/dL — ABNORMAL LOW (ref 42–165)
TIBC: 272 ug/dL (ref 215–435)
UIBC: 240 ug/dL (ref 125–400)

## 2013-08-12 LAB — FERRITIN: Ferritin: 291.5 ng/mL (ref 22.0–322.0)

## 2013-08-13 LAB — CERULOPLASMIN: Ceruloplasmin: 35 mg/dL (ref 20–60)

## 2013-08-15 ENCOUNTER — Other Ambulatory Visit: Payer: Self-pay | Admitting: General Surgery

## 2013-08-15 ENCOUNTER — Ambulatory Visit (INDEPENDENT_AMBULATORY_CARE_PROVIDER_SITE_OTHER): Payer: Medicare Other | Admitting: *Deleted

## 2013-08-15 ENCOUNTER — Encounter: Payer: Self-pay | Admitting: Cardiology

## 2013-08-15 ENCOUNTER — Ambulatory Visit (INDEPENDENT_AMBULATORY_CARE_PROVIDER_SITE_OTHER): Payer: Medicare Other | Admitting: Cardiology

## 2013-08-15 ENCOUNTER — Encounter: Payer: Self-pay | Admitting: General Surgery

## 2013-08-15 VITALS — BP 138/93 | HR 70 | Ht 73.0 in | Wt 226.0 lb

## 2013-08-15 DIAGNOSIS — L0390 Cellulitis, unspecified: Secondary | ICD-10-CM | POA: Insufficient documentation

## 2013-08-15 DIAGNOSIS — I10 Essential (primary) hypertension: Secondary | ICD-10-CM | POA: Diagnosis not present

## 2013-08-15 DIAGNOSIS — I4891 Unspecified atrial fibrillation: Secondary | ICD-10-CM

## 2013-08-15 DIAGNOSIS — R55 Syncope and collapse: Secondary | ICD-10-CM

## 2013-08-15 DIAGNOSIS — L0291 Cutaneous abscess, unspecified: Secondary | ICD-10-CM

## 2013-08-15 DIAGNOSIS — R05 Cough: Secondary | ICD-10-CM

## 2013-08-15 DIAGNOSIS — L02419 Cutaneous abscess of limb, unspecified: Secondary | ICD-10-CM | POA: Insufficient documentation

## 2013-08-15 DIAGNOSIS — L03119 Cellulitis of unspecified part of limb: Secondary | ICD-10-CM | POA: Insufficient documentation

## 2013-08-15 LAB — ANTI-SMOOTH MUSCLE/MITOCHOND.
Mitochondrial Ab: 10 Units (ref 0.0–20.0)
Smooth Muscle Ab: 31 Units — ABNORMAL HIGH (ref 0–19)

## 2013-08-15 LAB — POCT INR: INR: 2.6

## 2013-08-15 MED ORDER — DOXYCYCLINE HYCLATE 100 MG PO TABS: ORAL_TABLET | ORAL | Status: DC

## 2013-08-15 NOTE — Progress Notes (Signed)
[address] [place], [place]  [address] Phone: [phone] Fax:  [phone]  Date:  08/15/2013   ID:  Trevor [name], DOB Feb 20, 1951, MRN [id number]  PCP:  Dyke Maes, MD  Cardiologist:  Armanda Magic, MD     History of Present Illness: Trevor [name] is a 62 y.o. male with a history of HTN, ESRD, afib, systemic anticoagulation and syncope secondary felt secondary to afib with RVR who presents back today for followup.  He is on chronic anticoagulation with coumadin and plan is for DCCV in the near future.  He denies any chest pain, SOB, DOE, palpitations or syncope.  He has had some presyncopal episodes when coughing but no other episodes of syncope.  He has not seen anyone for his cough.     Wt Readings from Last 3 Encounters:  08/15/13 226 lb (102.513 kg)  07/21/13 225 lb (102.059 kg)  07/18/13 232 lb (105.235 kg)     Past Medical History  Diagnosis Date  . Anemia   . AVF (arteriovenous fistula)     Left  . Secondary hyperparathyroidism   . Hypovitaminosis D   . Hypertensive urgency     H/o  . CHF (congestive heart failure)   . Exertional shortness of breath     "related to infection in my lungs right now" (05/03/2013)  . History of gout     "before I started doing the dialysis" (05/03/2013)  . ESRD (end stage renal disease) on dialysis     "[employer]; MWF" (05/03/2013)  . Sleep apnea   . GERD (gastroesophageal reflux disease)   . Headache(784.0)   . Syncope     felt secondary to residual anesthesia the day before - 2D echo unremarkable  . Pulmonary embolism     with right DVT secondary to recent surgery  . Dysrhythmia     Hx: aflutter  . Atrial fibrillation     on coumadin  . Prolonged Q-T interval on ECG     Current Outpatient Prescriptions  Medication Sig Dispense Refill  . amLODipine (NORVASC) 10 MG tablet Take 10 mg by mouth daily.        . Hydrocodone-Acetaminophen 10-300 MG TABS Take 10-300 mg by mouth.      . labetalol  (NORMODYNE) 200 MG tablet Take 200 mg by mouth 2 (two) times daily.      . sevelamer carbonate (RENVELA) 800 MG tablet Take 3,200 mg by mouth as needed (for kidneys).       . warfarin (COUMADIN) 5 MG tablet Take as directed by Anticoagulation clinic  45 tablet  1   No current facility-administered medications for this visit.    Allergies:   No Known Allergies  Social History:  The patient  reports that he quit smoking about 40 years ago. His smoking use included Cigarettes. He has a .125 pack-year smoking history. He has never used smokeless tobacco. He reports that he drinks alcohol. He reports that he does not use illicit drugs.   Family History:  The patient's family history includes Allergies in his father; Deep vein thrombosis in his sister; Diabetes in his paternal grandmother; Hypertension in his father; Kidney disease in his father; Pulmonary embolism in his sister.   ROS:  Please see the history of present illness.      All other systems reviewed and negative.   PHYSICAL EXAM: VS:  BP 138/93  Pulse 70  Ht 6\' 1"  (1.854 m)  Wt 226 lb (102.513 kg)  BMI 29.82 kg/m2 Well nourished, well developed, in no acute distress HEENT: normal Neck: no JVD Cardiac:  normal S1, S2; irregularly irregular; no murmur Lungs:  clear to auscultation bilaterally, no wheezing, rhonchi or rales Abd: soft, nontender, no hepatomegaly Ext: no edema Skin: warm and dry Neuro:  CNs 2-12 intact, no focal abnormalities noted      ASSESSMENT AND PLAN:  1.  Atrial fibrillation rated controlled with therapeutic INR   - continue Warfarin/Labetolol   - DCCV on Dec 22nd 2.  Cough induced syncope with no more syncopal episodes but still has episodes of dizziness with coughing - he saw Dr. Sherene Sires a few months ago but is still coughing 3.  HTN - mildly elevated today   -continue amlodipine/labetolol 4.  Recent DVT with PE on systemic anticoaguation 5.  LLE cellulitis   - will start on doxycycline 100mg  BID x 10  days     Signed, Armanda Magic, MD 08/15/2013 2:16 PM

## 2013-08-15 NOTE — Patient Instructions (Signed)
Your physician has recommended you make the following change in your medication: Start Doxycycline 100 MG BID X 10 Days  Dr. Sherene Sires is going to see you Weds 12/10 at 9:45 for your Cough Induced Syncope. If you need to r/s that appointment please call their office.  Your physician has recommended that you have a Cardioversion (DCCV). Electrical Cardioversion uses a jolt of electricity to your heart either through paddles or wired patches attached to your chest. This is a controlled, usually prescheduled, procedure. Defibrillation is done under light anesthesia in the hospital, and you usually go home the day of the procedure. This is done to get your heart back into a normal rhythm. You are not awake for the procedure. Please see the instruction sheet given to you today. ( You are set for 08/29/13 at 2:00 PM and set for your lab work on 08/22/13.)   Your physician recommends that you schedule a follow-up appointment in: 4 Weeks with [name]

## 2013-08-17 ENCOUNTER — Ambulatory Visit: Payer: Medicare Other | Admitting: Internal Medicine

## 2013-08-18 ENCOUNTER — Other Ambulatory Visit (HOSPITAL_COMMUNITY): Payer: Medicare Other

## 2013-08-18 ENCOUNTER — Encounter: Payer: Medicare Other | Admitting: Vascular Surgery

## 2013-08-22 ENCOUNTER — Ambulatory Visit (INDEPENDENT_AMBULATORY_CARE_PROVIDER_SITE_OTHER): Payer: Medicare Other | Admitting: *Deleted

## 2013-08-22 DIAGNOSIS — I4891 Unspecified atrial fibrillation: Secondary | ICD-10-CM | POA: Diagnosis not present

## 2013-08-22 LAB — POCT INR: INR: 3.1

## 2013-08-24 ENCOUNTER — Encounter: Payer: Self-pay | Admitting: Vascular Surgery

## 2013-08-25 ENCOUNTER — Ambulatory Visit (INDEPENDENT_AMBULATORY_CARE_PROVIDER_SITE_OTHER): Payer: Self-pay | Admitting: Vascular Surgery

## 2013-08-25 ENCOUNTER — Encounter: Payer: Self-pay | Admitting: Vascular Surgery

## 2013-08-25 ENCOUNTER — Ambulatory Visit (HOSPITAL_COMMUNITY)
Admission: RE | Admit: 2013-08-25 | Discharge: 2013-08-25 | Disposition: A | Discharge status: Home / Self Care | Payer: Medicare Other | Source: Ambulatory Visit | Attending: Vascular Surgery | Admitting: Vascular Surgery

## 2013-08-25 VITALS — BP 137/98 | HR 83 | Ht 73.0 in | Wt 226.0 lb

## 2013-08-25 DIAGNOSIS — Z4931 Encounter for adequacy testing for hemodialysis: Secondary | ICD-10-CM

## 2013-08-25 DIAGNOSIS — X58XXXA Exposure to other specified factors, initial encounter: Secondary | ICD-10-CM | POA: Insufficient documentation

## 2013-08-25 DIAGNOSIS — N186 End stage renal disease: Secondary | ICD-10-CM

## 2013-08-25 DIAGNOSIS — S40029A Contusion of unspecified upper arm, initial encounter: Secondary | ICD-10-CM | POA: Diagnosis not present

## 2013-08-25 NOTE — Progress Notes (Signed)
Patient is a 62 year old male who underwent left basilic vein transposition fistula on 06/27/2013. He returns today for followup. He denies any numbness or tingling in his hand. He has noticed a lump at the upper aspect of his fistula. He also thinks his catheter is irritating a chronic cough.  Physical exam:  Filed Vitals:   08/25/13 1314  BP: 137/98  Pulse: 83  Height: 6\' 1"  (1.854 m)  Weight: 226 lb (102.513 kg)  SpO2: 100%    Left upper arm fistula easily palpable thrill audible bruit well-developed 3 x 3 cm mass left upper arm consistent with hematoma  Data: Duplex ultrasound was performed of the fistula today. There were slightly increased velocities at proximal anastomosis of 500 cm/s. However the fistula is well-developed 628 mm in diameter. It is less than 3 mm from the skin. Hematoma in the upper arm was seen it is 2.6 x 3.8 cm. There is no compression of the fistula.  Assessment: Maturing left upper arm AV fistula.  Plan: Start cannulation January 20. The patient will followup as needed.  Fabienne Bruns, MD [facility] Office: [phone] Pager: [phone]

## 2013-08-29 ENCOUNTER — Ambulatory Visit (INDEPENDENT_AMBULATORY_CARE_PROVIDER_SITE_OTHER): Payer: Medicare Other | Admitting: Pharmacist

## 2013-08-29 ENCOUNTER — Other Ambulatory Visit: Payer: Self-pay | Admitting: Nurse Practitioner

## 2013-08-29 ENCOUNTER — Encounter (HOSPITAL_COMMUNITY)
Admission: RE | Disposition: A | Discharge status: Home / Self Care | Payer: Medicare Other | Source: Ambulatory Visit | Attending: Cardiology

## 2013-08-29 ENCOUNTER — Encounter (HOSPITAL_COMMUNITY): Payer: Medicare Other | Admitting: Certified Registered Nurse Anesthetist

## 2013-08-29 ENCOUNTER — Ambulatory Visit (HOSPITAL_COMMUNITY): Payer: Medicare Other | Admitting: Certified Registered Nurse Anesthetist

## 2013-08-29 ENCOUNTER — Ambulatory Visit (HOSPITAL_BASED_OUTPATIENT_CLINIC_OR_DEPARTMENT_OTHER)
Admission: RE | Admit: 2013-08-29 | Discharge: 2013-08-29 | Disposition: A | Discharge status: Home / Self Care | Payer: Medicare Other | Source: Ambulatory Visit | Attending: Cardiology | Admitting: Cardiology

## 2013-08-29 ENCOUNTER — Encounter (HOSPITAL_COMMUNITY): Payer: Self-pay | Admitting: Certified Registered Nurse Anesthetist

## 2013-08-29 DIAGNOSIS — R0602 Shortness of breath: Secondary | ICD-10-CM | POA: Insufficient documentation

## 2013-08-29 DIAGNOSIS — I120 Hypertensive chronic kidney disease with stage 5 chronic kidney disease or end stage renal disease: Secondary | ICD-10-CM | POA: Insufficient documentation

## 2013-08-29 DIAGNOSIS — N186 End stage renal disease: Secondary | ICD-10-CM | POA: Insufficient documentation

## 2013-08-29 DIAGNOSIS — R51 Headache: Secondary | ICD-10-CM | POA: Insufficient documentation

## 2013-08-29 DIAGNOSIS — Z7901 Long term (current) use of anticoagulants: Secondary | ICD-10-CM | POA: Insufficient documentation

## 2013-08-29 DIAGNOSIS — E213 Hyperparathyroidism, unspecified: Secondary | ICD-10-CM | POA: Insufficient documentation

## 2013-08-29 DIAGNOSIS — I4891 Unspecified atrial fibrillation: Secondary | ICD-10-CM

## 2013-08-29 DIAGNOSIS — I509 Heart failure, unspecified: Secondary | ICD-10-CM | POA: Insufficient documentation

## 2013-08-29 DIAGNOSIS — K219 Gastro-esophageal reflux disease without esophagitis: Secondary | ICD-10-CM | POA: Insufficient documentation

## 2013-08-29 DIAGNOSIS — E559 Vitamin D deficiency, unspecified: Secondary | ICD-10-CM | POA: Insufficient documentation

## 2013-08-29 DIAGNOSIS — D649 Anemia, unspecified: Secondary | ICD-10-CM | POA: Insufficient documentation

## 2013-08-29 DIAGNOSIS — G4730 Sleep apnea, unspecified: Secondary | ICD-10-CM | POA: Insufficient documentation

## 2013-08-29 HISTORY — PX: CARDIOVERSION: SHX1299

## 2013-08-29 LAB — POCT I-STAT 4, (NA,K, GLUC, HGB,HCT)
Hemoglobin: 12.6 g/dL — ABNORMAL LOW (ref 13.0–17.0)
Potassium: 5.1 mEq/L (ref 3.5–5.1)

## 2013-08-29 LAB — POCT INR: INR: 4

## 2013-08-29 SURGERY — CARDIOVERSION
Anesthesia: General

## 2013-08-29 MED ORDER — SODIUM CHLORIDE 0.9 % IV SOLN
INTRAVENOUS | Status: DC | PRN
  Administered 2013-08-29: 13:00:00 via INTRAVENOUS

## 2013-08-29 MED ORDER — LIDOCAINE HCL (CARDIAC) 20 MG/ML IV SOLN
INTRAVENOUS | Status: DC | PRN
  Administered 2013-08-29: 20 mg via INTRAVENOUS

## 2013-08-29 MED ORDER — SODIUM CHLORIDE 0.9 % IV SOLN: INTRAVENOUS | Status: DC

## 2013-08-29 MED ORDER — PROPOFOL 10 MG/ML IV BOLUS
INTRAVENOUS | Status: DC | PRN
  Administered 2013-08-29: 70 mg via INTRAVENOUS

## 2013-08-29 NOTE — Preoperative (Signed)
Beta Blockers   Reason not to administer Beta Blockers:Not Applicable 

## 2013-08-29 NOTE — Transfer of Care (Signed)
Immediate Anesthesia Transfer of Care Note  Patient: Trevor [name]  Procedure(s) Performed: Procedure(s): CARDIOVERSION (N/A)  Patient Location: PACU  Anesthesia Type:General  Level of Consciousness: awake, oriented, patient cooperative and lethargic  Airway & Oxygen Therapy: Patient Spontanous Breathing and Patient connected to nasal cannula oxygen  Post-op Assessment: Report given to PACU RN, Post -op Vital signs reviewed and stable and Patient moving all extremities  Post vital signs: Reviewed and stable  Complications: No apparent anesthesia complications

## 2013-08-29 NOTE — Anesthesia Preprocedure Evaluation (Addendum)
Anesthesia Evaluation  Patient identified by MRN, date of birth, ID band Patient awake    Reviewed: Allergy & Precautions, H&P , NPO status , Patient's Chart, lab work & pertinent test results  Airway Mallampati: II TM Distance: >3 FB Neck ROM: full    Dental  (+) Poor Dentition and Dental Advidsory Given   Pulmonary shortness of breath and at rest, sleep apnea , former smoker,  breath sounds clear to auscultation        Cardiovascular hypertension, On Medications +CHF + dysrhythmias Atrial Fibrillation Rhythm:Irregular Rate:Normal     Neuro/Psych  Headaches,  Neuromuscular disease    GI/Hepatic GERD-  Medicated and Poorly Controlled,  Endo/Other    Renal/GU ESRFRenal disease     Musculoskeletal   Abdominal   Peds  Hematology  (+) anemia ,   Anesthesia Other Findings   Reproductive/Obstetrics                          Anesthesia Physical Anesthesia Plan  ASA: III  Anesthesia Plan: General   Post-op Pain Management:    Induction: Intravenous  Airway Management Planned: Mask and Natural Airway  Additional Equipment:   Intra-op Plan:   Post-operative Plan:   Informed Consent: I have reviewed the patients History and Physical, chart, labs and discussed the procedure including the risks, benefits and alternatives for the proposed anesthesia with the patient or authorized representative who has indicated his/her understanding and acceptance.   Dental advisory given  Plan Discussed with: CRNA and Anesthesiologist  Anesthesia Plan Comments:         Anesthesia Quick Evaluation

## 2013-08-29 NOTE — Interval H&P Note (Signed)
History and Physical Interval Note:  08/29/2013 10:18 AM  [name]  has presented today for surgery, with the diagnosis of A FIB   The various methods of treatment have been discussed with the patient and family. After consideration of risks, benefits and other options for treatment, the patient has consented to  Procedure(s): CARDIOVERSION (N/A) as a surgical intervention .  The patient's history has been reviewed, patient examined, no change in status, stable for surgery.  I have reviewed the patient's chart and labs.  Questions were answered to the patient's satisfaction.     [name],TRACI R

## 2013-08-29 NOTE — Anesthesia Postprocedure Evaluation (Signed)
  Anesthesia Post-op Note  Patient: Trevor [name]  Procedure(s) Performed: Procedure(s): CARDIOVERSION (N/A)  Patient Location: PACU  Anesthesia Type:General  Level of Consciousness: awake, alert , oriented and patient cooperative  Airway and Oxygen Therapy: Patient Spontanous Breathing and Patient connected to nasal cannula oxygen  Post-op Pain: none  Post-op Assessment: Post-op Vital signs reviewed, Patient's Cardiovascular Status Stable, Respiratory Function Stable and Patent Airway  Post-op Vital Signs: Reviewed and stable  Complications: No apparent anesthesia complications

## 2013-08-29 NOTE — H&P (Signed)
ID: GORMAN SAFI, DOB 02-15-1951, MRN [id number]  PCP: Dyke Maes, MD  Cardiologist: Armanda Magic, MD   History of Present Illness:  Trevor [name] is a 62 y.o. male with a history of HTN, ESRD, afib, systemic anticoagulation and syncope secondary felt secondary to afib with RVR who presents back today for followup. He is on chronic anticoagulation with coumadin and plan is for DCCV in the near future. He denies any chest pain, SOB, DOE, palpitations or syncope. He has had some presyncopal episodes when coughing but no other episodes of syncope. He has not seen anyone for his cough.    Wt Readings from Last 3 Encounters:   08/15/13  226 lb (102.513 kg)   07/21/13  225 lb (102.059 kg)   07/18/13   232 lb (105.235 kg)    Past Medical History   Diagnosis  Date   .  Anemia    .  AVF (arteriovenous fistula)      Left   .  Secondary hyperparathyroidism    .  Hypovitaminosis D    .  Hypertensive urgency      H/o   .  CHF (congestive heart failure)    .  Exertional shortness of breath      "related to infection in my lungs right now" (05/03/2013)   .  History of gout      "before I started doing the dialysis" (05/03/2013)   .  ESRD (end stage renal disease) on dialysis      "[facility]; MWF" (05/03/2013)   .  Sleep apnea    .  GERD (gastroesophageal reflux disease)    .  Headache(784.0)    .  Syncope      felt secondary to residual anesthesia the day before - 2D echo unremarkable   .  Pulmonary embolism      with right DVT secondary to recent surgery   .  Dysrhythmia      Hx: aflutter   .  Atrial fibrillation      on coumadin   .   Prolonged Q-T interval on ECG     Current Outpatient Prescriptions   Medication  Sig  Dispense  Refill   .  amLODipine (NORVASC) 10 MG tablet  Take 10 mg by mouth daily.     .  Hydrocodone-Acetaminophen 10-300 MG TABS  Take 10-300 mg by mouth.     .  labetalol (NORMODYNE) 200 MG tablet  Take 200 mg by mouth 2 (two) times daily.     .   sevelamer carbonate (RENVELA) 800 MG tablet  Take 3,200 mg by mouth as needed (for kidneys).     .    warfarin (COUMADIN) 5 MG tablet  Take as directed by Anticoagulation clinic  45 tablet  1    No current facility-administered medications for this visit.    Allergies: No Known Allergies   Social History: The patient reports that he quit smoking about 40 years ago. His smoking use included Cigarettes. He has a .125 pack-year smoking history. He has never used smokeless tobacco. He reports that he drinks alcohol. He reports that he does not use illicit drugs.   Family History: The patient's family history includes Allergies in his father; Deep vein thrombosis in his sister; Diabetes in his paternal grandmother; Hypertension in his father; Kidney disease in his father; Pulmonary embolism in his sister.   ROS: Please see the history of present illness. All other systems reviewed and negative.  PHYSICAL EXAM:  VS: BP 138/93  Pulse 70  Ht 6\' 1"  (1.854 m)  Wt 226 lb (102.513 kg)  BMI 29.82 kg/m2  Well nourished, well developed, in no acute distress  HEENT: normal  Neck: no JVD  Cardiac: normal S1, S2; irregularly irregular; no murmur  Lungs: clear to auscultation bilaterally, no wheezing, rhonchi or rales  Abd: soft, nontender, no hepatomegaly  Ext: no edema  Skin: warm and dry  Neuro: CNs 2-12 intact, no focal abnormalities noted   ASSESSMENT/PLAN:  1. Atrial fibrillation rated controlled with therapeutic INR  - continue Warfarin/Labetolol  - DCCV today 2. HTN 3. Recent DVT with PE on systemic anticoaguation

## 2013-08-29 NOTE — CV Procedure (Signed)
Electrical Cardioversion Procedure Note Trevor [name] [id number] 04/17/1951  Procedure: Electrical Cardioversion Indications:  Atrial Fibrillation  Time Out: Verified patient identification, verified procedure,medications/allergies/relevent history reviewed, required imaging and test results available.  Performed  Procedure Details  The patient was NPO after midnight. Anesthesia was administered at the beside  by Dr.Joslin with 20mg  Lidocaine and 70mg  of propofol.  Cardioversion was done with synchronized biphasic defibrillation with AP pads with 150watts.  The patient converted to normal sinus rhythm. The patient tolerated the procedure well   IMPRESSION:  Successful cardioversion of atrial fibrillation    [name],TRACI R 08/29/2013, 1:54 PM

## 2013-08-30 ENCOUNTER — Encounter: Payer: Self-pay | Admitting: *Deleted

## 2013-08-30 ENCOUNTER — Encounter (HOSPITAL_COMMUNITY): Payer: Self-pay | Admitting: Cardiology

## 2013-09-01 ENCOUNTER — Encounter (HOSPITAL_COMMUNITY): Payer: Self-pay | Admitting: Emergency Medicine

## 2013-09-01 ENCOUNTER — Emergency Department (HOSPITAL_COMMUNITY): Payer: Medicare Other

## 2013-09-01 ENCOUNTER — Inpatient Hospital Stay (HOSPITAL_COMMUNITY)
Admission: EM | Admit: 2013-09-01 | Discharge: 2013-09-06 | DRG: 865 | Disposition: A | Discharge status: Home / Self Care | Payer: Medicare Other | Attending: Family Medicine | Admitting: Family Medicine

## 2013-09-01 DIAGNOSIS — I509 Heart failure, unspecified: Secondary | ICD-10-CM

## 2013-09-01 DIAGNOSIS — Z992 Dependence on renal dialysis: Secondary | ICD-10-CM | POA: Diagnosis not present

## 2013-09-01 DIAGNOSIS — D649 Anemia, unspecified: Secondary | ICD-10-CM | POA: Diagnosis not present

## 2013-09-01 DIAGNOSIS — D631 Anemia in chronic kidney disease: Secondary | ICD-10-CM | POA: Diagnosis present

## 2013-09-01 DIAGNOSIS — Z86711 Personal history of pulmonary embolism: Secondary | ICD-10-CM

## 2013-09-01 DIAGNOSIS — I2789 Other specified pulmonary heart diseases: Secondary | ICD-10-CM | POA: Diagnosis present

## 2013-09-01 DIAGNOSIS — L02419 Cutaneous abscess of limb, unspecified: Secondary | ICD-10-CM

## 2013-09-01 DIAGNOSIS — K219 Gastro-esophageal reflux disease without esophagitis: Secondary | ICD-10-CM

## 2013-09-01 DIAGNOSIS — R509 Fever, unspecified: Secondary | ICD-10-CM | POA: Diagnosis present

## 2013-09-01 DIAGNOSIS — L0390 Cellulitis, unspecified: Secondary | ICD-10-CM

## 2013-09-01 DIAGNOSIS — B9789 Other viral agents as the cause of diseases classified elsewhere: Secondary | ICD-10-CM | POA: Diagnosis not present

## 2013-09-01 DIAGNOSIS — A419 Sepsis, unspecified organism: Secondary | ICD-10-CM

## 2013-09-01 DIAGNOSIS — R042 Hemoptysis: Secondary | ICD-10-CM

## 2013-09-01 DIAGNOSIS — N039 Chronic nephritic syndrome with unspecified morphologic changes: Secondary | ICD-10-CM | POA: Diagnosis not present

## 2013-09-01 DIAGNOSIS — R160 Hepatomegaly, not elsewhere classified: Secondary | ICD-10-CM

## 2013-09-01 DIAGNOSIS — N2581 Secondary hyperparathyroidism of renal origin: Secondary | ICD-10-CM | POA: Diagnosis not present

## 2013-09-01 DIAGNOSIS — G4733 Obstructive sleep apnea (adult) (pediatric): Secondary | ICD-10-CM

## 2013-09-01 DIAGNOSIS — R05 Cough: Secondary | ICD-10-CM

## 2013-09-01 DIAGNOSIS — T148XXA Other injury of unspecified body region, initial encounter: Secondary | ICD-10-CM

## 2013-09-01 DIAGNOSIS — J309 Allergic rhinitis, unspecified: Secondary | ICD-10-CM

## 2013-09-01 DIAGNOSIS — I10 Essential (primary) hypertension: Secondary | ICD-10-CM

## 2013-09-01 DIAGNOSIS — R55 Syncope and collapse: Secondary | ICD-10-CM

## 2013-09-01 DIAGNOSIS — Z8249 Family history of ischemic heart disease and other diseases of the circulatory system: Secondary | ICD-10-CM

## 2013-09-01 DIAGNOSIS — D509 Iron deficiency anemia, unspecified: Secondary | ICD-10-CM

## 2013-09-01 DIAGNOSIS — Z86718 Personal history of other venous thrombosis and embolism: Secondary | ICD-10-CM | POA: Diagnosis not present

## 2013-09-01 DIAGNOSIS — S301XXA Contusion of abdominal wall, initial encounter: Secondary | ICD-10-CM | POA: Diagnosis not present

## 2013-09-01 DIAGNOSIS — I2699 Other pulmonary embolism without acute cor pulmonale: Secondary | ICD-10-CM

## 2013-09-01 DIAGNOSIS — Z7901 Long term (current) use of anticoagulants: Secondary | ICD-10-CM

## 2013-09-01 DIAGNOSIS — I82409 Acute embolism and thrombosis of unspecified deep veins of unspecified lower extremity: Secondary | ICD-10-CM | POA: Diagnosis not present

## 2013-09-01 DIAGNOSIS — I120 Hypertensive chronic kidney disease with stage 5 chronic kidney disease or end stage renal disease: Secondary | ICD-10-CM | POA: Diagnosis present

## 2013-09-01 DIAGNOSIS — N186 End stage renal disease: Secondary | ICD-10-CM | POA: Diagnosis present

## 2013-09-01 DIAGNOSIS — IMO0001 Reserved for inherently not codable concepts without codable children: Secondary | ICD-10-CM

## 2013-09-01 DIAGNOSIS — M7981 Nontraumatic hematoma of soft tissue: Secondary | ICD-10-CM | POA: Diagnosis present

## 2013-09-01 DIAGNOSIS — Z79899 Other long term (current) drug therapy: Secondary | ICD-10-CM

## 2013-09-01 DIAGNOSIS — Z87891 Personal history of nicotine dependence: Secondary | ICD-10-CM | POA: Diagnosis not present

## 2013-09-01 DIAGNOSIS — D696 Thrombocytopenia, unspecified: Secondary | ICD-10-CM | POA: Diagnosis present

## 2013-09-01 DIAGNOSIS — E569 Vitamin deficiency, unspecified: Secondary | ICD-10-CM

## 2013-09-01 DIAGNOSIS — N189 Chronic kidney disease, unspecified: Secondary | ICD-10-CM

## 2013-09-01 DIAGNOSIS — G4730 Sleep apnea, unspecified: Secondary | ICD-10-CM | POA: Diagnosis present

## 2013-09-01 DIAGNOSIS — B349 Viral infection, unspecified: Secondary | ICD-10-CM

## 2013-09-01 DIAGNOSIS — I4892 Unspecified atrial flutter: Secondary | ICD-10-CM

## 2013-09-01 DIAGNOSIS — I4891 Unspecified atrial fibrillation: Secondary | ICD-10-CM | POA: Diagnosis present

## 2013-09-01 DIAGNOSIS — S3011XA Contusion of abdominal wall, initial encounter: Secondary | ICD-10-CM | POA: Diagnosis not present

## 2013-09-01 DIAGNOSIS — IMO0002 Reserved for concepts with insufficient information to code with codable children: Secondary | ICD-10-CM | POA: Diagnosis not present

## 2013-09-01 DIAGNOSIS — T45515A Adverse effect of anticoagulants, initial encounter: Secondary | ICD-10-CM | POA: Diagnosis present

## 2013-09-01 LAB — PROTIME-INR: INR: 2.73 — ABNORMAL HIGH (ref 0.00–1.49)

## 2013-09-01 LAB — CBC WITH DIFFERENTIAL/PLATELET
Eosinophils Absolute: 0.1 10*3/uL (ref 0.0–0.7)
Eosinophils Relative: 3 % (ref 0–5)
HCT: 25.7 % — ABNORMAL LOW (ref 39.0–52.0)
Hemoglobin: 8.4 g/dL — ABNORMAL LOW (ref 13.0–17.0)
Lymphs Abs: 0.6 10*3/uL — ABNORMAL LOW (ref 0.7–4.0)
MCH: 25 pg — ABNORMAL LOW (ref 26.0–34.0)
MCV: 76.5 fL — ABNORMAL LOW (ref 78.0–100.0)
Monocytes Absolute: 0.9 10*3/uL (ref 0.1–1.0)
Monocytes Relative: 27 % — ABNORMAL HIGH (ref 3–12)
Neutro Abs: 1.7 10*3/uL (ref 1.7–7.7)
Platelets: 114 10*3/uL — ABNORMAL LOW (ref 150–400)
RBC: 3.36 MIL/uL — ABNORMAL LOW (ref 4.22–5.81)
RDW: 18.8 % — ABNORMAL HIGH (ref 11.5–15.5)
WBC: 3.2 10*3/uL — ABNORMAL LOW (ref 4.0–10.5)

## 2013-09-01 LAB — ABO/RH: ABO/RH(D): A POS

## 2013-09-01 LAB — TYPE AND SCREEN: ABO/RH(D): A POS

## 2013-09-01 LAB — INFLUENZA PANEL BY PCR (TYPE A & B): H1N1 flu by pcr: NOT DETECTED

## 2013-09-01 LAB — BASIC METABOLIC PANEL
BUN: 18 mg/dL (ref 6–23)
Calcium: 8.4 mg/dL (ref 8.4–10.5)
Creatinine, Ser: 4.88 mg/dL — ABNORMAL HIGH (ref 0.50–1.35)
GFR calc non Af Amer: 12 mL/min — ABNORMAL LOW (ref >90)
Glucose, Bld: 83 mg/dL (ref 70–99)
Sodium: 134 mEq/L — ABNORMAL LOW (ref 135–145)

## 2013-09-01 LAB — LACTIC ACID, PLASMA: Lactic Acid, Venous: 1.1 mmol/L (ref 0.5–2.2)

## 2013-09-01 LAB — CG4 I-STAT (LACTIC ACID): Lactic Acid, Venous: 2.51 mmol/L — ABNORMAL HIGH (ref 0.5–2.2)

## 2013-09-01 MED ORDER — HYDROCOD POLST-CHLORPHEN POLST 10-8 MG/5ML PO LQCR
5.0000 mL | Freq: Two times a day (BID) | ORAL | Status: DC | PRN
  Administered 2013-09-01 – 2013-09-05 (×3): 5 mL via ORAL
  Filled 2013-09-01 (×3): qty 5

## 2013-09-01 MED ORDER — LABETALOL HCL 200 MG PO TABS
200.0000 mg | ORAL_TABLET | Freq: Two times a day (BID) | ORAL | Status: DC
  Administered 2013-09-01 – 2013-09-06 (×11): 200 mg via ORAL
  Filled 2013-09-01 (×14): qty 1

## 2013-09-01 MED ORDER — OSELTAMIVIR PHOSPHATE 75 MG PO CAPS
75.0000 mg | ORAL_CAPSULE | ORAL | Status: DC
  Administered 2013-09-01 (×2): 75 mg via ORAL
  Filled 2013-09-01 (×3): qty 1

## 2013-09-01 MED ORDER — SEVELAMER CARBONATE 800 MG PO TABS
3200.0000 mg | ORAL_TABLET | Freq: Three times a day (TID) | ORAL | Status: DC
  Administered 2013-09-01 – 2013-09-06 (×15): 3200 mg via ORAL
  Filled 2013-09-01 (×21): qty 4

## 2013-09-01 MED ORDER — SODIUM CHLORIDE 0.9 % IJ SOLN
3.0000 mL | Freq: Two times a day (BID) | INTRAMUSCULAR | Status: DC
  Administered 2013-09-01 – 2013-09-06 (×10): 3 mL via INTRAVENOUS

## 2013-09-01 MED ORDER — ONDANSETRON HCL 4 MG PO TABS: 4.0000 mg | ORAL_TABLET | Freq: Four times a day (QID) | ORAL | Status: DC | PRN

## 2013-09-01 MED ORDER — DEXTROSE 5 % IV SOLN
2.0000 g | Freq: Once | INTRAVENOUS | Status: AC
  Administered 2013-09-01: 2 g via INTRAVENOUS
  Filled 2013-09-01: qty 2

## 2013-09-01 MED ORDER — IOHEXOL 300 MG/ML  SOLN
75.0000 mL | Freq: Once | INTRAMUSCULAR | Status: AC | PRN
  Administered 2013-09-01: 75 mL via INTRAVENOUS

## 2013-09-01 MED ORDER — ONDANSETRON HCL 4 MG/2ML IJ SOLN: 4.0000 mg | Freq: Four times a day (QID) | INTRAMUSCULAR | Status: DC | PRN

## 2013-09-01 MED ORDER — GENTAMICIN IN SALINE 1.6-0.9 MG/ML-% IV SOLN
80.0000 mg | Freq: Once | INTRAVENOUS | Status: AC
  Administered 2013-09-01: 80 mg via INTRAVENOUS
  Filled 2013-09-01: qty 50

## 2013-09-01 MED ORDER — AMLODIPINE BESYLATE 10 MG PO TABS
10.0000 mg | ORAL_TABLET | Freq: Every day | ORAL | Status: DC
  Administered 2013-09-01 – 2013-09-06 (×6): 10 mg via ORAL
  Filled 2013-09-01 (×7): qty 1

## 2013-09-01 MED ORDER — VANCOMYCIN HCL IN DEXTROSE 1-5 GM/200ML-% IV SOLN
1000.0000 mg | Freq: Once | INTRAVENOUS | Status: AC
  Administered 2013-09-01: 10:00:00 1000 mg via INTRAVENOUS
  Filled 2013-09-01: qty 200

## 2013-09-01 MED ORDER — VANCOMYCIN HCL IN DEXTROSE 1-5 GM/200ML-% IV SOLN
1000.0000 mg | Freq: Once | INTRAVENOUS | Status: AC
  Administered 2013-09-01: 1000 mg via INTRAVENOUS
  Filled 2013-09-01: qty 200

## 2013-09-01 MED ORDER — HYDROCODONE-ACETAMINOPHEN 5-325 MG PO TABS
1.0000 | ORAL_TABLET | ORAL | Status: DC | PRN
  Administered 2013-09-01 – 2013-09-05 (×10): 1 via ORAL
  Filled 2013-09-01 (×9): qty 1

## 2013-09-01 MED ORDER — HYDROCOD POLST-CHLORPHEN POLST 10-8 MG/5ML PO LQCR
5.0000 mL | Freq: Once | ORAL | Status: AC
  Administered 2013-09-01: 5 mL via ORAL
  Filled 2013-09-01: qty 5

## 2013-09-01 NOTE — Progress Notes (Signed)
ANTIBIOTIC CONSULT NOTE - INITIAL  Pharmacy Consult for Vancomycin/Cefepime  Indication: rule out sepsis  No Known Allergies  Patient Measurements: Height: 6\' 1"  (185.4 cm) Weight: 221 lb (100.245 kg) IBW/kg (Calculated) : 79.9  Vital Signs: Temp: 99.9 F (37.7 C) (12/25 0517) Temp src: Oral (12/25 0517) BP: 122/68 mmHg (12/25 0517) Pulse Rate: 94 (12/25 0517)  Labs:  Recent Labs  08/29/13 1341 09/01/13 0210  WBC  --  3.2*  HGB 12.6* 8.4*  PLT  --  114*  CREATININE  --  4.88*   Estimated Creatinine Clearance: 20 ml/min (by C-G formula based on Cr of 4.88). No results found for this basename: VANCOTROUGH, VANCOPEAK, VANCORANDOM, GENTTROUGH, GENTPEAK, GENTRANDOM, TOBRATROUGH, TOBRAPEAK, TOBRARND, AMIKACINPEAK, AMIKACINTROU, AMIKACIN,  in the last 72 hours   Microbiology: No results found for this or any previous visit (from the past 720 hour(s)).  Medical History: Past Medical History  Diagnosis Date  . Anemia   . AVF (arteriovenous fistula)     Left  . Secondary hyperparathyroidism   . Hypovitaminosis D   . Hypertensive urgency     H/o  . CHF (congestive heart failure)   . Exertional shortness of breath     "related to infection in my lungs right now" (05/03/2013)  . History of gout     "before I started doing the dialysis" (05/03/2013)  . ESRD (end stage renal disease) on dialysis     "[facility]; MWF" (05/03/2013)  . Sleep apnea   . GERD (gastroesophageal reflux disease)   . Headache(784.0)   . Syncope     felt secondary to residual anesthesia the day before - 2D echo unremarkable  . Pulmonary embolism     with right DVT secondary to recent surgery  . Dysrhythmia     Hx: aflutter  . Atrial fibrillation     on coumadin  . Prolonged Q-T interval on ECG    Assessment: 62 y/o M with ESRD on HD here with fever/cough to start broad spectrum antibiotics. Received Vanco/Gent x 1 in the ED. WBC 3.2, other labs as above. Blood cultures obtained.   Goal of  Therapy:  Pre-HD vancomycin level 15-25 mg/L  Plan:  -Give an additional 1000 mg vancomycin to complete 2000 mg LOAD -Vancomycin 1000 mg qHD, will not order yet as unsure of what schedule will be here given holiday schedule -Cefepime 2g IV x 1 now, then 2g IV qHD-1800, also will not order yet given holiday schedule for HD -Trend WBC, temp, HD schedule -F/U any cultures, imaging -Drug levels as indicated   Thank you for allowing me to take part in this patient's care,  Abran Duke, PharmD Clinical Pharmacist Phone: [phone] Pager: [phone] 09/01/2013 6:03 AM

## 2013-09-01 NOTE — ED Notes (Signed)
Ice chips given to patient.

## 2013-09-01 NOTE — ED Notes (Addendum)
Pt has a large on the Right upper back at the shoulder blades.  Pt states he has right sided chest pain that radiates to the right back where the mass is located.  Patient states palpation of area is painful with a pain level of 6/10.  States mass appeared last Monday.

## 2013-09-01 NOTE — H&P (Signed)
[facility] History and Physical  Trevor [name] ZOX:[id number] DOB: November 23, 1950    PCP:   Dyke Maes, MD   Chief Complaint: fever and coughs.  HPI: Trevor [name] is an 62 y.o. male with hx of ESRD on HD (T Th S, but M W Sat on holiday schedule), hx of anemia, afib with anticoagulation, s/p cardioversion successfully a few days ago, hematoma on right thorax prior to cardioversion, hx of gout, presents to the ER with fever of 101, non productive coughs, and malaise.  He has no HA, shortness of breath, GI or GU complaints.  Evalaution included a chest CT showing a hematoma with no evidence of infiltrates, a Hb of 8.4 grams per dL ( a drop of 3 grams of hemoglobin in 3 days), with no black or bloody stool, normal K, and Cr of 4.8.  He has no distant travel or ill contact.  He does have a line on his right upper chest for dialysis.  Hospitalist was asked to admit him for fever of unclear origin.  He was given Gen and Zenaida Niece IV.    Rewiew of Systems:  Constitutional: Negative for weight loss or weight gain Eyes: Negative for eye pain, redness and discharge, diplopia, visual changes, or flashes of light. ENMT: Negative for ear pain, hoarseness, nasal congestion, sinus pressure and sore throat. No headaches; tinnitus, drooling, or problem swallowing. Cardiovascular: Negative for chest pain, palpitations, diaphoresis, dyspnea and peripheral edema. ; No orthopnea, PND Respiratory: Negative for hemoptysis, and stridor. No pleuritic chestpain. Gastrointestinal: Negative for nausea, vomiting, diarrhea, constipation, abdominal pain, melena, blood in stool, hematemesis, jaundice and rectal bleeding.    Genitourinary: Negative for frequency, dysuria, incontinence,flank pain and hematuria; Musculoskeletal: Negative for back pain and neck pain. Negative for swelling and trauma.;  Skin: . Negative for pruritus, rash, abrasions, bruising and skin lesion.; ulcerations Neuro: Negative for headache,  lightheadedness and neck stiffness. Negative for weakness, altered level of consciousness , altered mental status, extremity weakness, burning feet, involuntary movement, seizure and syncope.  Psych: negative for anxiety, depression, insomnia, tearfulness, panic attacks, hallucinations, paranoia, suicidal or homicidal ideation    Past Medical History  Diagnosis Date  . Anemia   . AVF (arteriovenous fistula)     Left  . Secondary hyperparathyroidism   . Hypovitaminosis D   . Hypertensive urgency     H/o  . CHF (congestive heart failure)   . Exertional shortness of breath     "related to infection in my lungs right now" (05/03/2013)  . History of gout     "before I started doing the dialysis" (05/03/2013)  . ESRD (end stage renal disease) on dialysis     "[facility]; MWF" (05/03/2013)  . Sleep apnea   . GERD (gastroesophageal reflux disease)   . Headache(784.0)   . Syncope     felt secondary to residual anesthesia the day before - 2D echo unremarkable  . Pulmonary embolism     with right DVT secondary to recent surgery  . Dysrhythmia     Hx: aflutter  . Atrial fibrillation     on coumadin  . Prolonged Q-T interval on ECG     Past Surgical History  Procedure Laterality Date  . Av fistula placement Left     Dr. Charlean Sanfilippo; "I've had 2 on the left' (05/03/2013)  . Av fistula placement Right ~ 2011  . Knee arthroscopy Left   . Avgg removal Right 05/04/2013    Procedure: REMOVAL OF ARTERIOVENOUS Fistula Right Arm;  Surgeon: [name], MD;  Location: [facility] OR;  Service: Vascular;  Laterality: Right;  . Insertion of dialysis catheter Right 05/04/2013    Procedure: INSERTION OF DIALYSIS CATHETER;  Surgeon: [name], MD;  Location: [facility] OR;  Service: Vascular;  Laterality: Right;  . Colonoscopy      Hx: of  . Bascilic vein transposition Left 06/27/2013    Procedure: BASCILIC VEIN TRANSPOSITION;  Surgeon: [name], MD;  Location: [facility] OR;  Service: Vascular;  Laterality:  Left;  . Cardioversion N/A 08/29/2013    Procedure: CARDIOVERSION;  Surgeon: [name], MD;  Location: [facility] ENDOSCOPY;  Service: Cardiovascular;  Laterality: N/A;    Medications:  HOME MEDS: Prior to Admission medications   Medication Sig Start Date End Date Taking? Authorizing Provider  amLODipine (NORVASC) 10 MG tablet Take 10 mg by mouth daily.     Yes Historical Provider, MD  labetalol (NORMODYNE) 200 MG tablet Take 200 mg by mouth 2 (two) times daily.   Yes Historical Provider, MD  sevelamer carbonate (RENVELA) 800 MG tablet Take 3,200 mg by mouth as needed (for kidneys).    Yes Historical Provider, MD  warfarin (COUMADIN) 5 MG tablet Take as directed by Anticoagulation clinic 07/25/13  Yes [name], MD  Hydrocodone-Acetaminophen 10-300 MG TABS Take 10-300 mg by mouth. 07/29/13   Historical Provider, MD     Allergies:  No Known Allergies  Social History:   reports that he quit smoking about 41 years ago. His smoking use included Cigarettes. He has a .125 pack-year smoking history. He has never used smokeless tobacco. He reports that he drinks alcohol. He reports that he does not use illicit drugs.  Family History: Family History  Problem Relation Age of Onset  . Hypertension Father   . Kidney disease Father   . Allergies Father   . Deep vein thrombosis Sister   . Pulmonary embolism Sister   . Diabetes Paternal Grandmother      Physical Exam: Filed Vitals:   09/01/13 0415 09/01/13 0430 09/01/13 0500 09/01/13 0517  BP: 121/59 119/75 119/76 122/68  Pulse: 87 91 103 94  Temp:    99.9 F (37.7 C)  TempSrc:    Oral  Resp: 14 22 20 30   Height:      Weight:      SpO2: 94% 96% 97% 97%   Blood pressure 122/68, pulse 94, temperature 99.9 F (37.7 C), temperature source Oral, resp. rate 30, height 6\' 1"  (1.854 m), weight 100.245 kg (221 lb), SpO2 97.00%.  GEN:  Pleasant  patient lying in the stretcher in no acute distress; cooperative with exam. PSYCH:  alert  and oriented x4; does not appear anxious or depressed; affect is appropriate. HEENT: Mucous membranes pink and anicteric; PERRLA; EOM intact; no cervical lymphadenopathy nor thyromegaly or carotid bruit; no JVD; There were no stridor. Neck is very supple. Breasts:: Not examined CHEST WALL: No tenderness.  He has a hematoma on his right chest.  Dialysis catheter on right subclavian with no evidence of infection. CHEST: Normal respiration, clear to auscultation bilaterally.  HEART: Regular rate and rhythm.  There are no murmur, rub, or gallops.   BACK: No kyphosis or scoliosis; no CVA tenderness ABDOMEN: soft and non-tender; no masses, no organomegaly, normal abdominal bowel sounds; no pannus; no intertriginous candida. There is no rebound and no distention. Rectal Exam: Not done EXTREMITIES: No bone or joint deformity; age-appropriate arthropathy of the hands and knees; no edema; no ulcerations.  There is no calf tenderness. Genitalia: not examined PULSES: 2+ and symmetric SKIN: Normal hydration no rash or ulceration CNS: Cranial nerves 2-12 grossly intact no focal lateralizing neurologic deficit.  Speech is fluent; uvula elevated with phonation, facial symmetry and tongue midline. DTR are normal bilaterally, cerebella exam is intact, barbinski is negative and strengths are equaled bilaterally.  No sensory loss.   Labs on Admission:  Basic Metabolic Panel:  Recent Labs Lab 08/29/13 1341 09/01/13 0210  NA 136 134*  K 5.1 4.0  CL  --  96  CO2  --  29  GLUCOSE 71 83  BUN  --  18  CREATININE  --  4.88*  CALCIUM  --  8.4   Liver Function Tests: No results found for this basename: AST, ALT, ALKPHOS, BILITOT, PROT, ALBUMIN,  in the last 168 hours No results found for this basename: LIPASE, AMYLASE,  in the last 168 hours No results found for this basename: AMMONIA,  in the last 168 hours CBC:  Recent Labs Lab 08/29/13 1341 09/01/13 0210  WBC  --  3.2*  NEUTROABS  --  1.7  HGB 12.6*  8.4*  HCT 37.0* 25.7*  MCV  --  76.5*  PLT  --  114*   Cardiac Enzymes: No results found for this basename: CKTOTAL, CKMB, CKMBINDEX, TROPONINI,  in the last 168 hours  CBG: No results found for this basename: GLUCAP,  in the last 168 hours   Radiological Exams on Admission: Ct Chest W Contrast  09/01/2013   CLINICAL DATA:  Cough and fever of unknown origin.  EXAM: CT CHEST WITH CONTRAST  TECHNIQUE: Multidetector CT imaging of the chest was performed during intravenous contrast administration.  CONTRAST:  75mL OMNIPAQUE IOHEXOL 300 MG/ML  SOLN  COMPARISON:  Prior CT from 06/28/2013  FINDINGS: Extensive bulky partially calcified mediastinal and hilar adenopathy is similar as compared to the prior exam, presumably from old granulomatous disease. Bulky adenopathy within the upper abdomen is also similar.  Aorta and great vessels are unremarkable. Scattered calcified plaque noted within the aortic arch. Heart size is unchanged. Reflux of contrast into the hepatic veins and IVC is suggestive of right heart failure. Small pericardial effusion is present.  Pulmonary arteries are grossly unremarkable. No definite filling defects identified, although the study is not optimized for evaluation of pulmonary embolism.  Granuloma with associated irregular opacity within the right middle lobe is unchanged. No focal infiltrate identified to suggest pneumonia. No pulmonary edema or pleural effusion. Irregular biapical pleural thickening again noted.  Visualized portions of the upper abdomen is grossly unremarkable.  No acute osseous abnormality identified.  Large mixed density soft tissue mass is seen within the right lateral and posterior aspect of the chest (series 2, image 36). This lesion measures 21.0 x 16.0 x 7.7 cm. Finding is consistent with known hematoma within this region. No soft tissue emphysema. There is adjacent anasarca within the subcutaneous fat of the right hemi thorax.  IMPRESSION: 1. Large  hematoma within the right lateral and posterior aspect of the right hemi thorax, incompletely characterized on this examination. 2. Similar appearance of bulky calcified mediastinal and hilar adenopathy, presumably related to prior granulomatous infection. 3. Stable calcified granuloma with associated irregular opacity within the right middle lobe. No focal infiltrate to suggest pneumonia identified. 4. Small pericardial effusion. 5. Stable cardiomegaly. Reflux of contrast into the IVC and hepatic veins suggests right heart failure.   Electronically Signed   By: Janell Quiet.D.  On: 09/01/2013 04:09    EKG: Independently reviewed. NSR no acute ST -T changes.   Assessment/Plan Present on Admission:  . Fever of undetermined origin . Cough . Viral syndrome . HYPERTENSION . SECONDARY HYPERPARATHYROIDISM . Fever in adult  PLAN:  This gentleman will be admitted for fever of unclear source, though I suspect he could have influenza.  Will treat him with Tamiflu and place on precaution pending PCR.  He has a line, so will cover him with VAN and Gen IV as well.  For his HTN, will continue his meds.  He also has HTN and I will continue with his meds.  His anemia is a big drop, and I suspect its because of the hematoma.  His anemia is from chronic renal disease as well.  He is not short of breath or having any pain.  He is stable, full code, and will be admitted to [facility] service.  Thank yo for asking me to participate in his care.   Other plans as per orders.  Code Status: FULL Unk Lightning, MD. [facility] Pager [phone] 7pm to 7am.  09/01/2013, 5:23 AM

## 2013-09-01 NOTE — ED Notes (Signed)
Patient presents with cough and fever.  States he started with the fever on the 23rd.  Had cardioversion on Monday afternoon

## 2013-09-01 NOTE — Progress Notes (Signed)
Patient seen and evaluated earlier this am by my associate.  Please refer to his H and P for further details regarding assessment and plan.  Will hold coumadin given hematoma at right thorax and noticeable drop in hgb. Reassess hgb in AM.  Trevor [name], Pamala Hurry

## 2013-09-01 NOTE — Progress Notes (Signed)
Patient arrived from ED via stretcher into room 4E20. Explained to patient that he is on a Heart failure floor and patient stated has been on this floor before. Oriented patient to call bell system and room equipment. Patient understood everything. Vital signs are stable. Currently patient complains of no pain or discomfort at this time, will continue to monitor to end of shift and give the next nurse report.

## 2013-09-01 NOTE — ED Provider Notes (Signed)
CSN: [id number]     Arrival date & time 09/01/13  0055 History   First MD Initiated Contact with Patient 09/01/13 0136     Chief Complaint  Patient presents with  . Fever  . Mass on back    (Consider location/radiation/quality/duration/timing/severity/associated sxs/prior Treatment) HPI This patient is a very pleasant elderly man with multiple chronic medical problems including ESRD - last H/D yesterday, HTN, CHF, history of PE,  History of atrial fibrillation - s/p electrocardioversion 3d ago.   He presents with complaints of a productive cough with fever x 2 to 3 days. Tm 102F.  He noted a mass on his right mid back a few hours after cardioversion three days ago. This mass is mildly tender. The patient denies chest pain, fever, abdominal pain.   The patient reports compliance with all medications.     Past Medical History  Diagnosis Date  . Anemia   . AVF (arteriovenous fistula)     Left  . Secondary hyperparathyroidism   . Hypovitaminosis D   . Hypertensive urgency     H/o  . CHF (congestive heart failure)   . Exertional shortness of breath     "related to infection in my lungs right now" (05/03/2013)  . History of gout     "before I started doing the dialysis" (05/03/2013)  . ESRD (end stage renal disease) on dialysis     "[facility]; MWF" (05/03/2013)  . Sleep apnea   . GERD (gastroesophageal reflux disease)   . Headache(784.0)   . Syncope     felt secondary to residual anesthesia the day before - 2D echo unremarkable  . Pulmonary embolism     with right DVT secondary to recent surgery  . Dysrhythmia     Hx: aflutter  . Atrial fibrillation     on coumadin  . Prolonged Q-T interval on ECG    Past Surgical History  Procedure Laterality Date  . Av fistula placement Left     Dr. Charlean Sanfilippo; "I've had 2 on the left' (05/03/2013)  . Av fistula placement Right ~ 2011  . Knee arthroscopy Left   . Avgg removal Right 05/04/2013    Procedure: REMOVAL OF ARTERIOVENOUS  Fistula Right Arm;  Surgeon: [name], MD;  Location: [facility] OR;  Service: Vascular;  Laterality: Right;  . Insertion of dialysis catheter Right 05/04/2013    Procedure: INSERTION OF DIALYSIS CATHETER;  Surgeon: [name], MD;  Location: [facility] OR;  Service: Vascular;  Laterality: Right;  . Colonoscopy      Hx: of  . Bascilic vein transposition Left 06/27/2013    Procedure: BASCILIC VEIN TRANSPOSITION;  Surgeon: [name], MD;  Location: [facility] OR;  Service: Vascular;  Laterality: Left;  . Cardioversion N/A 08/29/2013    Procedure: CARDIOVERSION;  Surgeon: [name], MD;  Location: [facility] ENDOSCOPY;  Service: Cardiovascular;  Laterality: N/A;   Family History  Problem Relation Age of Onset  . Hypertension Father   . Kidney disease Father   . Allergies Father   . Deep vein thrombosis Sister   . Pulmonary embolism Sister   . Diabetes Paternal Grandmother    History  Substance Use Topics  . Smoking status: Former Smoker -- 0.25 packs/day for .5 years    Types: Cigarettes    Quit date: 09/08/1972  . Smokeless tobacco: Never Used  . Alcohol Use: Yes     Comment: 05/03/2013 "haven't had a glass of wine in ~ 3 months or so; sometimes  will have one w/dinner"    Review of Systems Ten point review of symptoms performed and is negative with the exception of symptoms noted above.   Allergies  Review of patient's allergies indicates no known allergies.  Home Medications   Current Outpatient Rx  Name  Route  Sig  Dispense  Refill  . amLODipine (NORVASC) 10 MG tablet   Oral   Take 10 mg by mouth daily.           . Hydrocodone-Acetaminophen 10-300 MG TABS   Oral   Take 10-300 mg by mouth.         . labetalol (NORMODYNE) 200 MG tablet   Oral   Take 200 mg by mouth 2 (two) times daily.         . sevelamer carbonate (RENVELA) 800 MG tablet   Oral   Take 3,200 mg by mouth as needed (for kidneys).          . warfarin (COUMADIN) 5 MG tablet      Take as directed by  Anticoagulation clinic   45 tablet   1     30-day supply    BP 150/94  Pulse 98  Temp(Src) 100.6 F (38.1 C) (Oral)  Resp 18  Ht 6\' 1"  (1.854 m)  Wt 221 lb (100.245 kg)  BMI 29.16 kg/m2  SpO2 98% Physical Exam Gen: well developed and well nourished appearing Head: NCAT Eyes: PERL, EOMI Nose: no epistaixis or rhinorrhea Mouth/throat: mucosa is moist and pink Neck: supple, no stridor Lungs: RR 28 to 32/min, CTA B, no wheezing, rhonchi or rales Chest: permacath in right chest -no signs of tunnel infection CV: rapid and irregular no murmur, extremities appear well perfused.  Abd: soft, notender, nondistended Back: soft tissue lesion with mild ttp measuring approx 10cm side to side by 15cm cephalad to caudad with hematoma over the right flank. No  midline ttp, no cva ttp Skin: warm and dry Ext: dialysis graft in LUE with good thrill and no signs of infection. normal to inspection, no dependent edema Neuro: CN ii-xii grossly intact, no focal deficits Psyche; normal affect,  calm and cooperative.   ED Course  Procedures (including critical care time)  Results for orders placed during the hospital encounter of 09/01/13 (from the past 24 hour(s))  INFLUENZA PANEL BY PCR     Status: None   Collection Time    09/01/13 10:41 AM      Result Value Range   Influenza A By PCR NEGATIVE  NEGATIVE   Influenza B By PCR NEGATIVE  NEGATIVE   H1N1 flu by pcr NOT DETECTED  NOT DETECTED   Results for orders placed during the hospital encounter of 09/01/13 (from the past 48 hour(s))  CBC WITH DIFFERENTIAL     Status: Abnormal   Collection Time    09/01/13  2:10 AM      Result Value Range   WBC 3.2 (*) 4.0 - 10.5 K/uL   RBC 3.36 (*) 4.22 - 5.81 MIL/uL   Hemoglobin 8.4 (*) 13.0 - 17.0 g/dL   HCT 78.4 (*) 69.6 - 29.5 %   MCV 76.5 (*) 78.0 - 100.0 fL   MCH 25.0 (*) 26.0 - 34.0 pg   MCHC 32.7  30.0 - 36.0 g/dL   RDW 28.4 (*) 13.2 - 44.0 %   Platelets 114 (*) 150 - 400 K/uL   Comment:  PLATELET COUNT CONFIRMED BY SMEAR   Neutrophils Relative % 52  43 - 77 %  Neutro Abs 1.7  1.7 - 7.7 K/uL   Lymphocytes Relative 18  12 - 46 %   Lymphs Abs 0.6 (*) 0.7 - 4.0 K/uL   Monocytes Relative 27 (*) 3 - 12 %   Monocytes Absolute 0.9  0.1 - 1.0 K/uL   Eosinophils Relative 3  0 - 5 %   Eosinophils Absolute 0.1  0.0 - 0.7 K/uL   Basophils Relative 0  0 - 1 %   Basophils Absolute 0.0  0.0 - 0.1 K/uL  BASIC METABOLIC PANEL     Status: Abnormal   Collection Time    09/01/13  2:10 AM      Result Value Range   Sodium 134 (*) 135 - 145 mEq/L   Potassium 4.0  3.5 - 5.1 mEq/L   Chloride 96  96 - 112 mEq/L   CO2 29  19 - 32 mEq/L   Glucose, Bld 83  70 - 99 mg/dL   BUN 18  6 - 23 mg/dL   Creatinine, Ser 4.09 (*) 0.50 - 1.35 mg/dL   Calcium 8.4  8.4 - 81.1 mg/dL   GFR calc non Af Amer 12 (*) >90 mL/min   GFR calc Af Amer 14 (*) >90 mL/min   Comment: (NOTE)     The eGFR has been calculated using the CKD EPI equation.     This calculation has not been validated in all clinical situations.     eGFR's persistently <90 mL/min signify possible Chronic Kidney     Disease.  PROTIME-INR     Status: Abnormal   Collection Time    09/01/13  2:10 AM      Result Value Range   Prothrombin Time 28.0 (*) 11.6 - 15.2 seconds   INR 2.73 (*) 0.00 - 1.49  TYPE AND SCREEN     Status: None   Collection Time    09/01/13  2:10 AM      Result Value Range   ABO/RH(D) A POS     Antibody Screen NEG     Sample Expiration 09/04/2013    ABO/RH     Status: None   Collection Time    09/01/13  2:10 AM      Result Value Range   ABO/RH(D) A POS    CG4 I-STAT (LACTIC ACID)     Status: Abnormal   Collection Time    09/01/13  2:15 AM      Result Value Range   Lactic Acid, Venous 2.51 (*) 0.5 - 2.2 mmol/L  LACTIC ACID, PLASMA     Status: None   Collection Time    09/01/13  2:30 AM      Result Value Range   Lactic Acid, Venous 1.1  0.5 - 2.2 mmol/L  INFLUENZA PANEL BY PCR     Status: None   Collection  Time    09/01/13 10:41 AM      Result Value Range   Influenza A By PCR NEGATIVE  NEGATIVE   Influenza B By PCR NEGATIVE  NEGATIVE   H1N1 flu by pcr NOT DETECTED  NOT DETECTED   Comment:            The Xpert Flu assay (FDA approved for     nasal aspirates or washes and     nasopharyngeal swab specimens), is     intended as an aid in the diagnosis of     influenza and should not be used as     a sole basis for treatment.  CT Chest W Contrast (Final result)  Result time: 09/01/13 04:09:43    Final result by Rad Results In Interface (09/01/13 04:09:43)    Narrative:   CLINICAL DATA: Cough and fever of unknown origin.  EXAM: CT CHEST WITH CONTRAST  TECHNIQUE: Multidetector CT imaging of the chest was performed during intravenous contrast administration.  CONTRAST: 75mL OMNIPAQUE IOHEXOL 300 MG/ML SOLN  COMPARISON: Prior CT from 06/28/2013  FINDINGS: Extensive bulky partially calcified mediastinal and hilar adenopathy is similar as compared to the prior exam, presumably from old granulomatous disease. Bulky adenopathy within the upper abdomen is also similar.  Aorta and great vessels are unremarkable. Scattered calcified plaque noted within the aortic arch. Heart size is unchanged. Reflux of contrast into the hepatic veins and IVC is suggestive of right heart failure. Small pericardial effusion is present.  Pulmonary arteries are grossly unremarkable. No definite filling defects identified, although the study is not optimized for evaluation of pulmonary embolism.  Granuloma with associated irregular opacity within the right middle lobe is unchanged. No focal infiltrate identified to suggest pneumonia. No pulmonary edema or pleural effusion. Irregular biapical pleural thickening again noted.  Visualized portions of the upper abdomen is grossly unremarkable.  No acute osseous abnormality identified.  Large mixed density soft tissue mass is seen within the  right lateral and posterior aspect of the chest (series 2, image 36). This lesion measures 21.0 x 16.0 x 7.7 cm. Finding is consistent with known hematoma within this region. No soft tissue emphysema. There is adjacent anasarca within the subcutaneous fat of the right hemi thorax.  IMPRESSION: 1. Large hematoma within the right lateral and posterior aspect of the right hemi thorax, incompletely characterized on this examination. 2. Similar appearance of bulky calcified mediastinal and hilar adenopathy, presumably related to prior granulomatous infection. 3. Stable calcified granuloma with associated irregular opacity within the right middle lobe. No focal infiltrate to suggest pneumonia identified. 4. Small pericardial effusion. 5. Stable cardiomegaly. Reflux of contrast into the IVC and hepatic veins suggests right heart failure.   Electronically Signed By: Rise Mu M.D. On: 09/01/2013 04:09     MDM  Patient with fever and sepsis. This may be due to hematoma. However, in light of the patient's permacath, one must also cover for line sepsis. Tx with empiric Gent and Vanc. Case discussed with Dr. Conley Rolls who will see and admit.     Brandt Loosen, MD 09/02/13 0330

## 2013-09-02 ENCOUNTER — Encounter (HOSPITAL_COMMUNITY): Payer: Self-pay | Admitting: Internal Medicine

## 2013-09-02 DIAGNOSIS — T148XXA Other injury of unspecified body region, initial encounter: Secondary | ICD-10-CM

## 2013-09-02 DIAGNOSIS — I10 Essential (primary) hypertension: Secondary | ICD-10-CM

## 2013-09-02 DIAGNOSIS — Z992 Dependence on renal dialysis: Secondary | ICD-10-CM

## 2013-09-02 DIAGNOSIS — D649 Anemia, unspecified: Secondary | ICD-10-CM

## 2013-09-02 DIAGNOSIS — I82409 Acute embolism and thrombosis of unspecified deep veins of unspecified lower extremity: Secondary | ICD-10-CM

## 2013-09-02 DIAGNOSIS — IMO0002 Reserved for concepts with insufficient information to code with codable children: Secondary | ICD-10-CM

## 2013-09-02 DIAGNOSIS — N186 End stage renal disease: Secondary | ICD-10-CM

## 2013-09-02 DIAGNOSIS — R05 Cough: Secondary | ICD-10-CM

## 2013-09-02 DIAGNOSIS — I2699 Other pulmonary embolism without acute cor pulmonale: Secondary | ICD-10-CM

## 2013-09-02 DIAGNOSIS — I509 Heart failure, unspecified: Secondary | ICD-10-CM

## 2013-09-02 LAB — CBC
HCT: 24.3 % — ABNORMAL LOW (ref 39.0–52.0)
Hemoglobin: 7.8 g/dL — ABNORMAL LOW (ref 13.0–17.0)
MCH: 24.4 pg — ABNORMAL LOW (ref 26.0–34.0)
MCHC: 32.1 g/dL (ref 30.0–36.0)
MCV: 75.9 fL — ABNORMAL LOW (ref 78.0–100.0)
RDW: 18.8 % — ABNORMAL HIGH (ref 11.5–15.5)

## 2013-09-02 MED ORDER — ACETAMINOPHEN 500 MG PO TABS
500.0000 mg | ORAL_TABLET | Freq: Four times a day (QID) | ORAL | Status: DC | PRN
  Administered 2013-09-02 – 2013-09-04 (×3): 500 mg via ORAL
  Filled 2013-09-02 (×3): qty 1

## 2013-09-02 NOTE — Progress Notes (Signed)
The patient had a fever of 100.7 this morning.  His hemoglobin dropped to 7.8 from 8.4.  The MD was text paged x 1.  The patient's urine is also dark and the patient believes that he is dehydrated.

## 2013-09-02 NOTE — Progress Notes (Signed)
[facility] PROGRESS NOTE  Trevor [name]:[id number] DOB: Jan 10, 1951 DOA: 09/01/2013 PCP: Dyke Maes, MD  Assessment/Plan: 1. Fever of undetermined origin - Blood cultures pending but negative to date - Influenza panel negative - Lactic acid level within normal limits - Could very well be from viral etiology but at this point we'll continue broad-spectrum antibiotics according to notes patient will be obtaining these during dialysis sessions - Question whether hematoma is serving as bacterial nidus  2. Hematoma - Large hematoma within the right lateral and posterior aspect of the right hemithorax. This is been associated with anemia with significant drop. Coumadin currently being held - Will reassess CBC next a.m. - Considering reversing Coumadin. Oncology to further decide after evaluation  3. history of DVT/PE - Patient recently on Coumadin but Coumadin being held due to worsening anemia - Given that patient will require anticoagulation in the future given history of PE/DVT and current anemia presumed to be secondary to hematoma this currently makes case complicated and as such will consult hematologist for further recommendations.  4. Hypertension - On amlodipine and labetalol, blood pressure relatively well controlled on this regimen  5. end-stage renal disease - Will consult Nephrology for management of ESRD (on HD)  Code Status: Full Family Communication: Discussed with patient directly no family at bedside Disposition Plan: Pending further improvement in condition   Consultants:  . Oncology  Procedures:  CT of chest  Antibiotics:  None  HPI/Subjective: Patient has no new complaints today. No new problems reported overnight  Objective: Filed Vitals:   09/02/13 1407  BP: 111/72  Pulse: 83  Temp: 98.3 F (36.8 C)  Resp: 26    Intake/Output Summary (Last 24 hours) at 09/02/13 1630 Last data filed at 09/02/13 1247  Gross per 24 hour   Intake    710 ml  Output    350 ml  Net    360 ml   Filed Weights   09/01/13 0102 09/01/13 0730 09/02/13 0559  Weight: 100.245 kg (221 lb) 102.195 kg (225 lb 4.8 oz) 102.604 kg (226 lb 3.2 oz)    Exam:   General:  Pt in NAD, Alert and Awake  Cardiovascular: RRR, no MRG  Respiratory: CTA BL, no wheezes  Abdomen: soft, NT, ND  Musculoskeletal: no cyanosis or clubbing   Data Reviewed: Basic Metabolic Panel:  Recent Labs Lab 08/29/13 1341 09/01/13 0210  NA 136 134*  K 5.1 4.0  CL  --  96  CO2  --  29  GLUCOSE 71 83  BUN  --  18  CREATININE  --  4.88*  CALCIUM  --  8.4   Liver Function Tests: No results found for this basename: AST, ALT, ALKPHOS, BILITOT, PROT, ALBUMIN,  in the last 168 hours No results found for this basename: LIPASE, AMYLASE,  in the last 168 hours No results found for this basename: AMMONIA,  in the last 168 hours CBC:  Recent Labs Lab 08/29/13 1341 09/01/13 0210 09/02/13 0350  WBC  --  3.2* 3.4*  NEUTROABS  --  1.7  --   HGB 12.6* 8.4* 7.8*  HCT 37.0* 25.7* 24.3*  MCV  --  76.5* 75.9*  PLT  --  114* 130*   Cardiac Enzymes: No results found for this basename: CKTOTAL, CKMB, CKMBINDEX, TROPONINI,  in the last 168 hours BNP (last 3 results) No results found for this basename: PROBNP,  in the last 8760 hours CBG: No results found for this basename: GLUCAP,  in the  last 168 hours  Recent Results (from the past 240 hour(s))  CULTURE, BLOOD (ROUTINE X 2)     Status: None   Collection Time    09/01/13  2:00 AM      Result Value Range Status   Specimen Description BLOOD LEFT ARM   Final   Special Requests BOTTLES DRAWN AEROBIC AND ANAEROBIC 10CC EACH   Final   Culture  Setup Time     Final   Value: 09/01/2013 09:59     Performed at [facility]   Culture     Final   Value:        BLOOD CULTURE RECEIVED NO GROWTH TO DATE CULTURE WILL BE HELD FOR 5 DAYS BEFORE ISSUING A FINAL NEGATIVE REPORT     Performed at [facility]   Report Status PENDING   Incomplete  CULTURE, BLOOD (ROUTINE X 2)     Status: None   Collection Time    09/01/13  2:10 AM      Result Value Range Status   Specimen Description BLOOD LEFT HAND   Final   Special Requests BOTTLES DRAWN AEROBIC ONLY 10CC   Final   Culture  Setup Time     Final   Value: 09/01/2013 10:00     Performed at [facility]   Culture     Final   Value:        BLOOD CULTURE RECEIVED NO GROWTH TO DATE CULTURE WILL BE HELD FOR 5 DAYS BEFORE ISSUING A FINAL NEGATIVE REPORT     Performed at [facility]   Report Status PENDING   Incomplete     Studies: Ct Chest W Contrast  09/01/2013   CLINICAL DATA:  Cough and fever of unknown origin.  EXAM: CT CHEST WITH CONTRAST  TECHNIQUE: Multidetector CT imaging of the chest was performed during intravenous contrast administration.  CONTRAST:  75mL OMNIPAQUE IOHEXOL 300 MG/ML  SOLN  COMPARISON:  Prior CT from 06/28/2013  FINDINGS: Extensive bulky partially calcified mediastinal and hilar adenopathy is similar as compared to the prior exam, presumably from old granulomatous disease. Bulky adenopathy within the upper abdomen is also similar.  Aorta and great vessels are unremarkable. Scattered calcified plaque noted within the aortic arch. Heart size is unchanged. Reflux of contrast into the hepatic veins and IVC is suggestive of right heart failure. Small pericardial effusion is present.  Pulmonary arteries are grossly unremarkable. No definite filling defects identified, although the study is not optimized for evaluation of pulmonary embolism.  Granuloma with associated irregular opacity within the right middle lobe is unchanged. No focal infiltrate identified to suggest pneumonia. No pulmonary edema or pleural effusion. Irregular biapical pleural thickening again noted.  Visualized portions of the upper abdomen is grossly unremarkable.  No acute osseous abnormality identified.  Large mixed density soft tissue mass  is seen within the right lateral and posterior aspect of the chest (series 2, image 36). This lesion measures 21.0 x 16.0 x 7.7 cm. Finding is consistent with known hematoma within this region. No soft tissue emphysema. There is adjacent anasarca within the subcutaneous fat of the right hemi thorax.  IMPRESSION: 1. Large hematoma within the right lateral and posterior aspect of the right hemi thorax, incompletely characterized on this examination. 2. Similar appearance of bulky calcified mediastinal and hilar adenopathy, presumably related to prior granulomatous infection. 3. Stable calcified granuloma with associated irregular opacity within the right middle lobe. No focal infiltrate to suggest pneumonia identified. 4.  Small pericardial effusion. 5. Stable cardiomegaly. Reflux of contrast into the IVC and hepatic veins suggests right heart failure.   Electronically Signed   By: Rise Mu M.D.   On: 09/01/2013 04:09    Scheduled Meds: . amLODipine  10 mg Oral Daily  . labetalol  200 mg Oral BID  . sevelamer carbonate  3,200 mg Oral TID WC  . sodium chloride  3 mL Intravenous Q12H   Continuous Infusions:   Principal Problem:   Fever of undetermined origin Active Problems:   HYPERTENSION   SECONDARY HYPERPARATHYROIDISM   ESRD on hemodialysis   Cough   Viral syndrome   Fever in adult    Time spent: > 45 minutes    Penny Pia  [facility] Pager [phone]. If 7PM-7AM, please contact night-coverage at [email or web address], password [facility] 09/02/2013, 4:30 PM  LOS: 1 day

## 2013-09-02 NOTE — Progress Notes (Signed)
Pt requesting Pedialyte - told MD of request. MD counseled pt on how that is not good for him plus may have a lot of Potassium in it and he should not be using this product at home.

## 2013-09-02 NOTE — Progress Notes (Signed)
Dr Cena Benton here to see pt. Made aware of abnormal  temp orders received. Pharmacy is dosing antibiotics.

## 2013-09-02 NOTE — Consult Note (Addendum)
ID: Trevor [name]   DOB: 1951/08/28  MR#: [id number]  JXB#:[id number]  PCP: Dyke Maes, MD OTHER MD:  This patient is seen in consultation at the request of Dr. Cena Benton for evaluation of bleeding while on therapeutic anticoagulation.  HISTORY OF PRESENT ILLNESS: Trevor [name] is a 62M with ESRD on HD (TTS), CHF, and AFib who was admitted on 12/22 for a planned DCCV, which was successful. He was discharged home, but represented on 12/25 c/o fever (101) and cough. He denies HA, sob, n/v/d, dysuria, BRBPR, or melena at that time.  The patient reports that he was in his usual state of health on 12/21 when he went to bed. He woke up in the early morning and first noted as painful swelling on his right back. He presented as scheduled for the DCCV and was discharged without issue. His post-procedural course was complicated by fever to 100.6 at home, so he presented to the ED for further evaluation as noted above. He does mention that he had had trouble with frequent fevers in the Fall when his RIGHT sided fistula was found to be infected. It has since been removed. He now has a LIJ HD access and has had a LEFT sided AVF surgery, but the fistula has not yet matured. The CT from admission (12/25) did show the hematoma w/o evidence of infiltrates in the lungs. The physical exam also notes the hematoma. His Hb was noted to drop significantly during this time, so he was admitted for further evaluation of his fever, cough, and anemia in the setting of systemic anticoagulation. He was started on broad spectrum abx while cultures are pending (NGTD x1 day). Flu was negative.  In regards to the history of DVT/PE, he was admitted to the hospital in 06/2013 after presenting to the ED in AF with RVR and SOB/syncope. CTA at that time revealed a small filling defect, which may have been artifact according to the radiology impression. He was also diagnosed with an acute to subacute distal LLE DVT. No D-dimer was done at this  time. He was recommended to go on anticoagulation at that time for provoked DVT/PE (recent surgeries) and a tentative plan to treat for 3 months. He denies any prior history of blood clots. He is a non-smoker and has not had any recent long-distance travel. His youngest sister is on warfarin for a blood clot. He is unaware of the circumstances of her diagnosis, but she does not have cancer or a diagnosed thrombophilia of which he is aware.  Today, Trevor [name] confirms the above history and feels well overall. His energy is fine and he has no sob, cp, n/v/d/c. He does have pain around the hematoma.   REVIEW OF SYSTEMS: a 10 system review is negative except as per HPI  PAST MEDICAL HISTORY: Past Medical History  Diagnosis Date  . Anemia   . AVF (arteriovenous fistula)     Left  . Secondary hyperparathyroidism   . Hypovitaminosis D   . Hypertensive urgency     H/o  . CHF (congestive heart failure)   . Exertional shortness of breath     "related to infection in my lungs right now" (05/03/2013)  . History of gout     "before I started doing the dialysis" (05/03/2013)  . ESRD (end stage renal disease) on dialysis     "[employer] [employer]; MWF" (05/03/2013)  . Sleep apnea   . GERD (gastroesophageal reflux disease)   . Headache(784.0)   . Syncope  felt secondary to residual anesthesia the day before - 2D echo unremarkable  . Pulmonary embolism     with right DVT secondary to recent surgery  . Dysrhythmia     Hx: aflutter  . Atrial fibrillation     on coumadin  . Prolonged Q-T interval on ECG     PAST SURGICAL HISTORY: Past Surgical History  Procedure Laterality Date  . Av fistula placement Left     Dr. Charlean Sanfilippo; "I've had 2 on the left' (05/03/2013)  . Av fistula placement Right ~ 2011  . Knee arthroscopy Left   . Avgg removal Right 05/04/2013    Procedure: REMOVAL OF ARTERIOVENOUS Fistula Right Arm;  Surgeon: [name], MD;  Location: [facility] OR;  Service: Vascular;  Laterality: Right;   . Insertion of dialysis catheter Right 05/04/2013    Procedure: INSERTION OF DIALYSIS CATHETER;  Surgeon: [name], MD;  Location: [facility] OR;  Service: Vascular;  Laterality: Right;  . Colonoscopy      Hx: of  . Bascilic vein transposition Left 06/27/2013    Procedure: BASCILIC VEIN TRANSPOSITION;  Surgeon: [name], MD;  Location: [facility] OR;  Service: Vascular;  Laterality: Left;  . Cardioversion N/A 08/29/2013    Procedure: CARDIOVERSION;  Surgeon: [name], MD;  Location: [facility] ENDOSCOPY;  Service: Cardiovascular;  Laterality: N/A;    FAMILY HISTORY Family History  Problem Relation Age of Onset  . Hypertension Father   . Kidney disease Father   . Allergies Father   . Deep vein thrombosis Sister   . Pulmonary embolism Sister   . Diabetes Paternal Grandmother     His youngest sister did have a blood clot as noted above  SOCIAL HISTORY: - nonsmoker - EtOH ~1/month - lives with wife   HEALTH MAINTENANCE: History  Substance Use Topics  . Smoking status: Former Smoker -- 0.25 packs/day for .5 years    Types: Cigarettes    Quit date: 09/08/1972  . Smokeless tobacco: Never Used  . Alcohol Use: Yes     Comment: 05/03/2013 "haven't had a glass of wine in ~ 3 months or so; sometimes will have one w/dinner"    No Known Allergies  Current Facility-Administered Medications  Medication Dose Route Frequency Provider Last Rate Last Dose  . acetaminophen (TYLENOL) tablet 500 mg  500 mg Oral Q6H PRN Penny Pia, MD   500 mg at 09/02/13 1006  . amLODipine (NORVASC) tablet 10 mg  10 mg Oral Daily Houston Siren, MD   10 mg at 09/02/13 0905  . chlorpheniramine-HYDROcodone (TUSSIONEX) 10-8 MG/5ML suspension 5 mL  5 mL Oral Q12H PRN Carron Curie, MD   5 mL at 09/02/13 1601  . HYDROcodone-acetaminophen (NORCO/VICODIN) 5-325 MG per tablet 1 tablet  1 tablet Oral Q4H PRN Houston Siren, MD   1 tablet at 09/02/13 1601  . labetalol (NORMODYNE) tablet 200 mg  200 mg Oral BID Houston Siren, MD   200 mg  at 09/02/13 0906  . ondansetron (ZOFRAN) tablet 4 mg  4 mg Oral Q6H PRN Houston Siren, MD       Or  . ondansetron [facility]) injection 4 mg  4 mg Intravenous Q6H PRN Houston Siren, MD      . sevelamer carbonate (RENVELA) tablet 3,200 mg  3,200 mg Oral TID WC Houston Siren, MD   3,200 mg at 09/02/13 1200  . sodium chloride 0.9 % injection 3 mL  3 mL Intravenous Q12H Houston Siren, MD   3 mL at 09/02/13  8657    OBJECTIVE: Filed Vitals:   09/02/13 1407  BP: 111/72  Pulse: 83  Temp: 98.3 F (36.8 C)  Resp: 26     Body mass index is 29.85 kg/(m^2).    ECOG FS:  HEENT: Sclerae anicteric LYMPH: No cervical or supraclavicular adenopathy PULM: CTAB, no wrr CV: NRRR, no mrg, 2+ distal pulses. LEFT AVF has +thrill. RIGHT HD access is c/d/i and not ttp GI: soft, ntnd, nabs, no HSM MSK no focal spinal tenderness, no peripheral edema, + soft tissue swelling on the upper right hemithorax that is warm to touch. It is outlined and dated. Neuro: A&Ox4. Otherwise no gross abnormalities    LAB RESULTS: Lab Results  Component Value Date   WBC 3.4* 09/02/2013   NEUTROABS 1.7 09/01/2013   HGB 7.8* 09/02/2013   HCT 24.3* 09/02/2013   MCV 75.9* 09/02/2013   PLT 130* 09/02/2013    Lab Results  Component Value Date   NA 134* 09/01/2013   K 4.0 09/01/2013   CL 96 09/01/2013   CO2 29 09/01/2013   GLUCOSE 83 09/01/2013   BUN 18 09/01/2013   CREATININE 4.88* 09/01/2013   CALCIUM 8.4 09/01/2013   GFRAA 14* 09/01/2013    Recent Labs Lab 09/01/13 0210  INR 2.73*   HCT: 25-27 range in 06/2013 --> 37 (12/22) --> 8.4 (12/25) --> 7.8 (12/26)  Urinalysis    Component Value Date/Time   COLORURINE YELLOW 05/03/2013 0603   APPEARANCEUR CLEAR 05/03/2013 0603   LABSPEC 1.012 05/03/2013 0603   PHURINE 8.5* 05/03/2013 0603   GLUCOSEU NEGATIVE 05/03/2013 0603   HGBUR SMALL* 05/03/2013 0603   BILIRUBINUR NEGATIVE 05/03/2013 0603   KETONESUR NEGATIVE 05/03/2013 0603   PROTEINUR >300* 05/03/2013 0603   UROBILINOGEN 0.2  05/03/2013 0603   NITRITE NEGATIVE 05/03/2013 0603   LEUKOCYTESUR NEGATIVE 05/03/2013 0603    STUDIES: Ct Chest W Contrast  09/01/2013  IVC is suggestive of right heart failure. Pulmonary arteries are grossly unremarkable. No definite filling defects identified, although the study is not optimized for evaluation of pulmonary embolism.  Large mixed density soft tissue mass is seen within the right lateral and posterior aspect of the chest (series 2, image 36). This lesion measures 21.0 x 16.0 x 7.7 cm. Finding is consistent with known hematoma within this region. IMPRESSION:  1. Large hematoma within the right lateral and posterior aspect of the right hemi thorax, incompletely characterized on this examination.  2. Similar appearance of bulky calcified mediastinal and hilar adenopathy, presumably related to prior granulomatous infection.  3. Stable calcified granuloma with associated irregular opacity within the right middle lobe. No focal infiltrate to suggest pneumonia identified.  4. Small pericardial effusion.  5. Stable cardiomegaly. Reflux of contrast into the IVC and hepatic veins suggests right heart failure.    BLE dopplers (10/22) - No evidence of deep vein or superficial thrombosis involving the left lower extremity. An enlarged inguinal lymph node is noted on the right. There appears to be acute to subacuteDVT in the right posterior tibial vein. Pulsatile waveforms bilaterally suggest fluid overload. - No evidence of Baker's cyst on the right or left. Other specific details can be found in the table(s) above  CTA (10/21) Small filling defect in a lower lobe pulmonary arterial branch. I favor this is related to respiratory motion as no other pulmonary emboli are seen, but it is difficult to completely exclude a very small pulmonary embolus.  Small right pleural effusion.  Calcified mediastinal and bilateral hilar adenopathy,  presumably related to old granulomatous disease.  Calcified granuloma in the right middle lobe.  Cardiomegaly.   ASSESSMENT: 62 y.o. male with ESRD on TTS HD due to HTN, AF s/p successful DCCV, and prior h/o distal DVT +/- PE. He is admitted for fever and cough in addition to an subacute hematoma formation while on supratherapeutic anticogulation.  He was on anticoagulation for two reasons: (1) superficial DVT of the LLE and possible PE diagnosed in 06/2013; and (2) atrial fibrillation with a CHADS2 score of 1 or greater. His INR was supratherapeutic on 12/15 and 12/22 (4.0) but was back to a therapeutic level at the time of admission (2.7). I agree with cessation of anticoagulation in the subacute setting. His hemoglobin drop is precipitous enough to consider pRBC. It would be reasonable to give him vitamin K now and check another value now and see if his trend is changing. If it continues to decline, or if he develops clinical signs or symptoms of anemia, he could be transfused with 2 units pRBC. If his clinical status worsens rapidly, he should be given [facility] or FFP as outlined below.  In regards to when/whether to restart anticoagulation, this is a complicated issue. Typically, distal DVTs are not treated unless clinically bothersome. The treatment decision at the time was likely made due to the simultaneous diagnoses of the PE in October, but the report is questioning whether it's really there or rather artifact. The original plan was for 3 months of anticoagulation for a provoked DVT. However, it may be reasonable to just stop anticoagulation altogether for these reasons.  Another possible indication for anticoagulation would be his AFib and CHADS2 score. However, he has successfully undergone DCCV and is currently in sinus rhythm. Cardiology's input (either now or as an outpatient) might be helpful.  Finally, the etiology of his hematoma is unclear. He was supratherapeutic at the time, but there is no other obvious cause. Agree with ongoing  monitoring and the thought that this could be a source of infection.  PLAN: ACUTELY: - hold further doses of anticoagulation - recommend giving vitamin K 5mg  po x1 - would recheck CBC now and transfuse 2 units pRBC for worsening anemia or new onset symptoms (type and screen is already in) - if there is concern for ongoing bleeding or hemodynamic instability felt to be due to bleed, would give PCC 25-50 units/kg IV (can be repeated after 6 hours if needed) and IVF +/- pRBC - no indication for platelets at this time - consider further investigation of source of hematoma  LONG TERM: - we will continue to follow and discuss whether anticoagulation is indicated for distal DVT and questionable PE - consider cardioloy consultation (in or outpatient) regarding plans for ASA v. anticoagulation for his risk of CVA from cardioverted AF now in NSR.  Beulah Gandy, MATHEW    09/02/2013 Trevor [name] was interviewed and examined. I reviewed the October 2014 chest CT in radiology.  Exam:   Lungs: Decreased breath sounds at the right posterior chest, no respiratory distress Cardiac: Regular rate and rhythm Abdomen: Tender in the right upper quadrant, no hepatosplenomegaly Vascular: Leg edema Musculoskeletal: Hematoma at the right chest wall with an ecchymosis extending to the right low abdominal wall  Dialysis catheter site with a gauze dressing  Impression: 1.  Fever 2.  Chronic cough 3.  Right chest wall hematoma 4.  Anemia  secondary to #3 and chronic renal failure 5.  Right leg DVT October 2014, question of pulmonary embolism October  2014 6.  Status post cardioversion 08/29/13 7.  Coumadin anticoagulation prior to admission, supratherapeutic earlier this week 8.  ESRD 9.  Chronic mild thrombocytopenia  He was admitted with a fever and right chest wall hematoma. He reports a hematoma was present on 08/28/2013. He was maintained on Coumadin anticoagulation prior to this hospital admission and the  PT/INR was supratherapeutic earlier this week. He is at risk for bleeding from Coumadin, chronic renal failure, and mild thrombocytopenia.  I agree with discontinuing Coumadin and giving a dose of vitamin K. The hemoglobin is stable today and the PT/INR is subtherapeutic.  He had a DVT in October of 2014. This was following multiple hospital admissions and surgical procedures over the preceding few months. There was a question of a pulmonary embolism. I reviewed the October chest CT and there is a nonocclusive filling defect in a segmental branch of the left pulmonary artery. I doubt this was responsible for his symptoms at the time.  The DVT in October which was likely related to hospitalizations and surgery, though there is a family history of venous thromboembolism. We can consider a hypercoagulation evaluation as an outpatient.  He may be a candidate for aspirin anticoagulation in the setting of atrial fibrillation and the recent cardioversion.  The fever may be related to the chest wall hematoma. Followup cultures.  Recommendations:  1. Hold Coumadin 2. Discuss the indication for aspirin therapy with cardiology 3. Outpatient hypercoagulation evaluation 4. I agree with giving a dose of vitamin K 5. Check CBC and PT/INR on 09/04/2013   Please call hematology as needed on 09/04/2013. I will check on him 09/05/2013.

## 2013-09-03 DIAGNOSIS — K219 Gastro-esophageal reflux disease without esophagitis: Secondary | ICD-10-CM

## 2013-09-03 LAB — CBC
HCT: 23.9 % — ABNORMAL LOW (ref 39.0–52.0)
Hemoglobin: 8.1 g/dL — ABNORMAL LOW (ref 13.0–17.0)
MCH: 25.6 pg — ABNORMAL LOW (ref 26.0–34.0)
MCHC: 33.9 g/dL (ref 30.0–36.0)
MCV: 75.4 fL — ABNORMAL LOW (ref 78.0–100.0)
RDW: 18.8 % — ABNORMAL HIGH (ref 11.5–15.5)

## 2013-09-03 LAB — BASIC METABOLIC PANEL
BUN: 49 mg/dL — ABNORMAL HIGH (ref 6–23)
Calcium: 8.7 mg/dL (ref 8.4–10.5)
Creatinine, Ser: 9.25 mg/dL — ABNORMAL HIGH (ref 0.50–1.35)
GFR calc Af Amer: 6 mL/min — ABNORMAL LOW (ref >90)
GFR calc non Af Amer: 5 mL/min — ABNORMAL LOW (ref >90)
Glucose, Bld: 72 mg/dL (ref 70–99)
Potassium: 4.4 mEq/L (ref 3.5–5.1)

## 2013-09-03 LAB — PROTIME-INR: INR: 1.86 — ABNORMAL HIGH (ref 0.00–1.49)

## 2013-09-03 MED ORDER — DOXERCALCIFEROL 4 MCG/2ML IV SOLN
1.0000 ug | INTRAVENOUS | Status: DC
  Filled 2013-09-03 (×3): qty 2

## 2013-09-03 MED ORDER — DARBEPOETIN ALFA-POLYSORBATE 200 MCG/0.4ML IJ SOLN
200.0000 ug | INTRAMUSCULAR | Status: DC
  Filled 2013-09-03: qty 0.4

## 2013-09-03 MED ORDER — DARBEPOETIN ALFA-POLYSORBATE 200 MCG/0.4ML IJ SOLN
INTRAMUSCULAR | Status: AC
  Administered 2013-09-03: 200 ug
  Filled 2013-09-03: qty 0.4

## 2013-09-03 MED ORDER — VANCOMYCIN HCL IN DEXTROSE 1-5 GM/200ML-% IV SOLN
1000.0000 mg | INTRAVENOUS | Status: AC
  Administered 2013-09-03: 1000 mg via INTRAVENOUS
  Filled 2013-09-03: qty 200

## 2013-09-03 MED ORDER — PHYTONADIONE 5 MG PO TABS
5.0000 mg | ORAL_TABLET | Freq: Once | ORAL | Status: AC
  Administered 2013-09-03: 5 mg via ORAL
  Filled 2013-09-03: qty 1

## 2013-09-03 MED ORDER — DEXTROSE 5 % IV SOLN
2.0000 g | INTRAVENOUS | Status: AC
  Administered 2013-09-03: 21:00:00 2 g via INTRAVENOUS
  Filled 2013-09-03 (×2): qty 2

## 2013-09-03 MED ORDER — DOXERCALCIFEROL 4 MCG/2ML IV SOLN
INTRAVENOUS | Status: AC
  Administered 2013-09-03: 1 ug
  Filled 2013-09-03: qty 2

## 2013-09-03 MED ORDER — GUAIFENESIN 100 MG/5ML PO SYRP
200.0000 mg | ORAL_SOLUTION | ORAL | Status: DC | PRN
  Administered 2013-09-03 (×2): 200 mg via ORAL
  Filled 2013-09-03 (×2): qty 10

## 2013-09-03 MED ORDER — HYDROCODONE-ACETAMINOPHEN 5-325 MG PO TABS
ORAL_TABLET | ORAL | Status: AC
  Filled 2013-09-03: qty 1

## 2013-09-03 MED ORDER — NEPRO/CARBSTEADY PO LIQD
237.0000 mL | Freq: Two times a day (BID) | ORAL | Status: DC | PRN
  Filled 2013-09-03: qty 237

## 2013-09-03 NOTE — Progress Notes (Addendum)
Client has been in dialysis since 1430.    Back from dialysis.  Nutrition called about tray and meds moved to be administered 30 minutes prior to meal.

## 2013-09-03 NOTE — Consult Note (Signed)
I have seen and examined this patient and agree with plan as outlined by Claud Kelp, PA-C.  Fever and large ecchymosis, however pt does have HD catheter so would be most concerned about that as source first.  Blood cultures pending and on vanc/gent.  Appears stable and in NAD.  AVF in LUE is maturing nicely and should be able to be used in about 3-4 weeks.  Plan for HD today and cont with the holiday schedule for TTS which is MWSat. Savannaha Stonerock A,MD 09/03/2013 1:14 PM

## 2013-09-03 NOTE — Progress Notes (Signed)
INITIAL NUTRITION ASSESSMENT  DOCUMENTATION CODES Per approved criteria  -Obesity Unspecified   INTERVENTION: Provide Nepro Carb Steady BID PRN  NUTRITION DIAGNOSIS: Increased nutrient needs related to hemodialysis as evidenced by estimated needs.   Goal: Pt to meet >/= 90% of their estimated nutrition needs   Monitor:  PO intake Weight trends Labs  Reason for Assessment: MST  62 y.o. male  Admitting Dx: Fever of undetermined origin  ASSESSMENT: 62 y.o. male ESRD patient (TTS HD) with past medical history significant for hypertension, DVT and pulmonary embolus on anticoagulation and afib cardioversion on 12/22 who presented to the ED on 12/25 with complaints of cough, malaise and fever for 2 days. Pt out of the room at hemodialysis at time of visit. Per nursing notes pt ate 85% of lunch today but only 30%,50%, and 100% of meals yesterday. Pt reported losing weight but, no major weight loss per weight history below.   Height: Ht Readings from Last 1 Encounters:  09/01/13 6\' 1"  (1.854 m)    Weight: Wt Readings from Last 1 Encounters:  09/03/13 230 lb 13.2 oz (104.7 kg)    Ideal Body Weight: 184 lbs  % Ideal Body Weight: 125%  Wt Readings from Last 10 Encounters:  09/03/13 230 lb 13.2 oz (104.7 kg)  08/25/13 226 lb (102.513 kg)  08/15/13 226 lb (102.513 kg)  07/21/13 225 lb (102.059 kg)  07/18/13 232 lb (105.235 kg)  07/15/13 228 lb 2 oz (103.477 kg)  07/07/13 224 lb 6.4 oz (101.787 kg)  07/03/13 234 lb 2.1 oz (106.2 kg)  06/27/13 224 lb 13.9 oz (102 kg)  06/27/13 224 lb 13.9 oz (102 kg)    Usual Body Weight: unknown  % Usual Body Weight: NA  BMI:  Body mass index is 30.46 kg/(m^2).  Estimated Nutritional Needs: Kcal: 2600-2900 Protein: 105 grams Fluid: 1200 ml fluid restriction per MD  Skin: non-pitting RLE and LLE edema; intact  Diet Order: Renal  EDUCATION NEEDS: -No education needs identified at this time   Intake/Output Summary (Last 24  hours) at 09/03/13 1723 Last data filed at 09/03/13 1317  Gross per 24 hour  Intake    720 ml  Output    100 ml  Net    620 ml    Last BM: 12/25  Labs:   Recent Labs Lab 08/29/13 1341 09/01/13 0210 09/03/13 0415  NA 136 134* 130*  K 5.1 4.0 4.4  CL  --  96 94*  CO2  --  29 24  BUN  --  18 49*  CREATININE  --  4.88* 9.25*  CALCIUM  --  8.4 8.7  GLUCOSE 71 83 72    CBG (last 3)  No results found for this basename: GLUCAP,  in the last 72 hours  Scheduled Meds: . amLODipine  10 mg Oral Daily  . ceFEPime (MAXIPIME) IV  2 g Intravenous To Hemo  . darbepoetin (ARANESP) injection - DIALYSIS  200 mcg Intravenous Q Sat-HD  . doxercalciferol  1 mcg Intravenous Custom  . labetalol  200 mg Oral BID  . sevelamer carbonate  3,200 mg Oral TID WC  . sodium chloride  3 mL Intravenous Q12H  . vancomycin  1,000 mg Intravenous To Hemo    Continuous Infusions:   Past Medical History  Diagnosis Date  . Anemia   . AVF (arteriovenous fistula)     Left  . Secondary hyperparathyroidism   . Hypovitaminosis D   . Hypertensive urgency     H/o  .  CHF (congestive heart failure)   . Exertional shortness of breath     "related to infection in my lungs right now" (05/03/2013)  . History of gout     "before I started doing the dialysis" (05/03/2013)  . ESRD (end stage renal disease) on dialysis     "[employer]; MWF" (05/03/2013)  . Sleep apnea   . GERD (gastroesophageal reflux disease)   . Headache(784.0)   . Syncope     felt secondary to residual anesthesia the day before - 2D echo unremarkable  . Pulmonary embolism     with right DVT secondary to recent surgery  . Dysrhythmia     Hx: aflutter  . Atrial fibrillation     on coumadin  . Prolonged Q-T interval on ECG     Past Surgical History  Procedure Laterality Date  . Av fistula placement Left     Dr. Charlean Sanfilippo; "I've had 2 on the left' (05/03/2013)  . Av fistula placement Right ~ 2011  . Knee arthroscopy Left   . Avgg  removal Right 05/04/2013    Procedure: REMOVAL OF ARTERIOVENOUS Fistula Right Arm;  Surgeon: [name], MD;  Location: [facility] OR;  Service: Vascular;  Laterality: Right;  . Insertion of dialysis catheter Right 05/04/2013    Procedure: INSERTION OF DIALYSIS CATHETER;  Surgeon: [name], MD;  Location: [facility] OR;  Service: Vascular;  Laterality: Right;  . Colonoscopy      Hx: of  . Bascilic vein transposition Left 06/27/2013    Procedure: BASCILIC VEIN TRANSPOSITION;  Surgeon: [name], MD;  Location: [facility] OR;  Service: Vascular;  Laterality: Left;  . Cardioversion N/A 08/29/2013    Procedure: CARDIOVERSION;  Surgeon: [name], MD;  Location: [facility] ENDOSCOPY;  Service: Cardiovascular;  Laterality: N/A;    Ian Malkin RD, LDN Inpatient Clinical Dietitian Pager: [phone] After Hours Pager: [phone]

## 2013-09-03 NOTE — Consult Note (Signed)
[facility] Renal Consultation Note    Indication for Consultation:  Management of ESRD/hemodialysis; anemia, hypertension/volume and secondary hyperparathyroidism  HPI: Trevor [name] is a 62 y.o. male ESRD patient (TTS HD) with past medical history significant for hypertension, DVT and pulmonary embolus on anticoagulation and afib cardioversion on 12/22 who presented to the ED on 12/25 with complaints of cough, malaise and fever for 2 days. Within the same time frame, he also developed a spontaneous hematoma to his right scapular region in the setting of a supratherapeutic INR. His coumadin dosing was adjusted in the outpatient setting and while is INR is < 2 here, the hematoma has grown larger.  He is now with associated ecchymosis which has extended well beyond margins marked in his right axillary upon admit to include his entire right flank (beyond the waistline) and a good portion of his right upper and lower abdominal quadrants.  His scapular area is mildly tender while the other areas of involvement are starting to itch.  CT His cough is productive, waxing and waning as an outpatient. CT  Past Medical History  Diagnosis Date  . Anemia   . AVF (arteriovenous fistula)     Left  . Secondary hyperparathyroidism   . Hypovitaminosis D   . Hypertensive urgency     H/o  . CHF (congestive heart failure)   . Exertional shortness of breath     "related to infection in my lungs right now" (05/03/2013)  . History of gout     "before I started doing the dialysis" (05/03/2013)  . ESRD (end stage renal disease) on dialysis     "[employer]; MWF" (05/03/2013)  . Sleep apnea   . GERD (gastroesophageal reflux disease)   . Headache(784.0)   . Syncope     felt secondary to residual anesthesia the day before - 2D echo unremarkable  . Pulmonary embolism     with right DVT secondary to recent surgery  . Dysrhythmia     Hx: aflutter  . Atrial fibrillation     on coumadin  .  Prolonged Q-T interval on ECG    Past Surgical History  Procedure Laterality Date  . Av fistula placement Left     Dr. Charlean Sanfilippo; "I've had 2 on the left' (05/03/2013)  . Av fistula placement Right ~ 2011  . Knee arthroscopy Left   . Avgg removal Right 05/04/2013    Procedure: REMOVAL OF ARTERIOVENOUS Fistula Right Arm;  Surgeon: [name], MD;  Location: [facility] OR;  Service: Vascular;  Laterality: Right;  . Insertion of dialysis catheter Right 05/04/2013    Procedure: INSERTION OF DIALYSIS CATHETER;  Surgeon: [name], MD;  Location: [facility] OR;  Service: Vascular;  Laterality: Right;  . Colonoscopy      Hx: of  . Bascilic vein transposition Left 06/27/2013    Procedure: BASCILIC VEIN TRANSPOSITION;  Surgeon: [name], MD;  Location: [facility] OR;  Service: Vascular;  Laterality: Left;  . Cardioversion N/A 08/29/2013    Procedure: CARDIOVERSION;  Surgeon: [name], MD;  Location: [facility] ENDOSCOPY;  Service: Cardiovascular;  Laterality: N/A;   Family History  Problem Relation Age of Onset  . Hypertension Father   . Kidney disease Father   . Allergies Father   . Deep vein thrombosis Sister   . Pulmonary embolism Sister   . Diabetes Paternal Grandmother    Social History:  reports that he quit smoking about 41 years ago. His smoking use included Cigarettes. He  has a .125 pack-year smoking history. He has never used smokeless tobacco. He reports that he drinks alcohol. He reports that he does not use illicit drugs. No Known Allergies Prior to Admission medications   Medication Sig Start Date End Date Taking? Authorizing Provider  amLODipine (NORVASC) 10 MG tablet Take 10 mg by mouth daily.     Yes Historical Provider, MD  labetalol (NORMODYNE) 200 MG tablet Take 200 mg by mouth 2 (two) times daily.   Yes Historical Provider, MD  sevelamer carbonate (RENVELA) 800 MG tablet Take 3,200 mg by mouth as needed (for kidneys).    Yes Historical Provider, MD  warfarin (COUMADIN) 5 MG  tablet Take as directed by Anticoagulation clinic 07/25/13  Yes [name], MD  Hydrocodone-Acetaminophen 10-300 MG TABS Take 10-300 mg by mouth. 07/29/13   Historical Provider, MD   Current Facility-Administered Medications  Medication Dose Route Frequency Provider Last Rate Last Dose  . acetaminophen (TYLENOL) tablet 500 mg  500 mg Oral Q6H PRN Penny Pia, MD   500 mg at 09/02/13 1006  . amLODipine (NORVASC) tablet 10 mg  10 mg Oral Daily Houston Siren, MD   10 mg at 09/03/13 1037  . chlorpheniramine-HYDROcodone (TUSSIONEX) 10-8 MG/5ML suspension 5 mL  5 mL Oral Q12H PRN Carron Curie, MD   5 mL at 09/02/13 1601  . guaifenesin (ROBITUSSIN) 100 MG/5ML syrup 200 mg  200 mg Oral Q4H PRN Jinger Neighbors, NP   200 mg at 09/03/13 1151  . HYDROcodone-acetaminophen (NORCO/VICODIN) 5-325 MG per tablet 1 tablet  1 tablet Oral Q4H PRN Houston Siren, MD   1 tablet at 09/03/13 1056  . labetalol (NORMODYNE) tablet 200 mg  200 mg Oral BID Houston Siren, MD   200 mg at 09/03/13 1037  . ondansetron (ZOFRAN) tablet 4 mg  4 mg Oral Q6H PRN Houston Siren, MD       Or  . ondansetron [facility]) injection 4 mg  4 mg Intravenous Q6H PRN Houston Siren, MD      . phytonadione (VITAMIN K) tablet 5 mg  5 mg Oral Once Penny Pia, MD      . sevelamer carbonate (RENVELA) tablet 3,200 mg  3,200 mg Oral TID WC Houston Siren, MD   3,200 mg at 09/03/13 0616  . sodium chloride 0.9 % injection 3 mL  3 mL Intravenous Q12H Houston Siren, MD   3 mL at 09/03/13 1000   Labs: Basic Metabolic Panel:  Recent Labs Lab 08/29/13 1341 09/01/13 0210 09/03/13 0415  NA 136 134* 130*  K 5.1 4.0 4.4  CL  --  96 94*  CO2  --  29 24  GLUCOSE 71 83 72  BUN  --  18 49*  CREATININE  --  4.88* 9.25*  CALCIUM  --  8.4 8.7   Liver Function Tests: No results found for this basename: AST, ALT, ALKPHOS, BILITOT, PROT, ALBUMIN,  in the last 168 hours No results found for this basename: LIPASE, AMYLASE,  in the last 168 hours No results found for this basename: AMMONIA,  in  the last 168 hours CBC:  Recent Labs Lab 09/01/13 0210 09/02/13 0350 09/03/13 0415  WBC 3.2* 3.4* 3.4*  NEUTROABS 1.7  --   --   HGB 8.4* 7.8* 8.1*  HCT 25.7* 24.3* 23.9*  MCV 76.5* 75.9* 75.4*  PLT 114* 130* 135*   Studies/Results: No results found.  ROS: Tender rt scapula. Ecchymosis and pruritus to rt trunk. 10 pt ROS asked and answered.  All other systems negative except as in HPI above.   Physical Exam: Filed Vitals:   09/02/13 2046 09/02/13 2100 09/03/13 0527 09/03/13 1037  BP: 112/45 117/75 114/74 134/87  Pulse: 101  83 89  Temp: 99.6 F (37.6 C)  98.7 F (37.1 C)   TempSrc: Oral  Oral   Resp: 22  22   Height:      Weight:   104.1 kg (229 lb 8 oz)   SpO2: 99%  95%      General: Well developed, well nourished, in no acute distress. Head: Normocephalic, atraumatic, sclera non-icteric, mucus membranes are moist Neck: Supple. JVD not elevated. Thorax: Grapefruit-sized hematoma to rt posterior thorax, firm, mildly tender. Ecchymosis to rt anterior/posterior trunk, mild pruritus. Lungs: Clear bilaterally to auscultation without wheezes, rales, or rhonchi. Breathing is unlabored. Heart: RRR with S1 S2. No murmurs, rubs, or gallops appreciated. Abdomen: Soft, non-tender, non-distended with normoactive bowel sounds. No rebound/guarding. No obvious abdominal masses. M-S:  Strength and tone appear normal for age. Lower extremities:without edema or ischemic changes, no open wounds  Neuro: Alert and oriented X 3. Moves all extremities spontaneously. Psych:  Responds to questions appropriately with a normal affect. Dialysis Access: Rt TDC/ maturing L AVF + bruit, no signs of infection.  Dialysis Orders: Center: AF  on TTS . Optiflux 200 EDW 101kg HD Bath 2K/2Ca  Time 4:30 Heparin 3200 (change to none at d/c). Access R TDC/ Maturing L AVF placed 10/20 BFR 400 DFR A1.5 [name] 1 mcg IV/HD Epogen 5400   Units IV/HD  Venofer  0 (Fe load completed 08/30/13) Recent labs: Hgb 11,  P 3.4, PTH 34  Assessment/Plan: 1. FUO - Mgmt per primary. Possibly related to chest wall hematoma. Influenza PCR negative. BC's negative thus far. On Vanc/Gent to cover [facility] as possible source. Supportive care for cough. 2. R posterior thorax spontaneous hematoma - Mgmt per primary. Acute onset in setting of supratherapeutic INR. Coumadin dose lowered op. INR 1.86 today. No heparin HD. Anticoagulation held. 3. ESRD -  TTS, K+4.4, HD today. Using R TDC. Maturing L AVF. Had f/u with [facility], ok to begin using on 09/27/13. 4. Hypertension/volume  - SBPs 110s-130s on labetalol 200 BID and Norvasc 10. Up ~3kg by wgts. No CXR here. No overt vol xs. 5. Anemia  - Hgb 8.1 > 7.9. Was 11 last week per op labs. Max aranesp. Fe load completed op on 12/23 for Tsat 17%. Transfuse prn. 6. Metabolic bone disease -  Ca and Phos controlled op. Continue Renvela 4 ac. Last PTH 34 on low dose [name]. 7. Nutrition - Renal diet, multivitamin 8. Hx Afib - S/p DCCV on 08/29/13. SR here. 9. Hx DVT with PE - anticoagulation held. INR 1.86 today.  Claud Kelp, PA-C [facility] Pager [phone] 09/03/2013, 11:53 AM

## 2013-09-03 NOTE — Progress Notes (Signed)
[facility] PROGRESS NOTE  Trevor [name] ZOX:[id number] DOB: [birth year] DOA: 09/01/2013 PCP: Dyke Maes, MD  Assessment/Plan: 1. Fever of undetermined origin - Blood cultures pending but negative to date - Influenza panel negative - Lactic acid level within normal limits - Could very well be from viral etiology but at this point we'll continue broad-spectrum antibiotics according to notes patient will be obtaining these during dialysis sessions - Question whether hematoma is serving as bacterial nidus, continue broad-spectrum antibiotics to be dose by pharmacy during dialysis session  2. Hematoma - Large hematoma within the right lateral and posterior aspect of the right hemithorax. This is been associated with anemia with significant drop. Coumadin currently being held - Continue to reassess CBC - Vitamin K. he has been administered as recommended by oncologist, discussed starting aspirin the patient would like to wait until hemoglobin levels are improved and Coumadin has been reversed  3. history of DVT/PE - Please refer to oncologist/hematologist consult note on 09/02/2013. At this point given anemia and worsening hematoma Coumadin will be held and reversed with vitamin K.  4. Hypertension - On amlodipine and labetalol, blood pressure relatively well controlled on this regimen  5. end-stage renal disease - Will consult Nephrology for management of ESRD (on HD)  Code Status: Full Family Communication: Discussed with patient directly no family at bedside Disposition Plan: Pending further improvement in condition   Consultants:  . Oncology  Procedures:  CT of chest  Antibiotics:  None  HPI/Subjective: Patient states that his bruising has gotten worse overnight. No new complaints otherwise  Objective: Filed Vitals:   09/03/13 1037  BP: 134/87  Pulse: 89  Temp:   Resp:     Intake/Output Summary (Last 24 hours) at 09/03/13 1300 Last data filed at  09/03/13 1009  Gross per 24 hour  Intake    480 ml  Output    100 ml  Net    380 ml   Filed Weights   09/01/13 0730 09/02/13 0559 09/03/13 0527  Weight: 102.195 kg (225 lb 4.8 oz) 102.604 kg (226 lb 3.2 oz) 104.1 kg (229 lb 8 oz)    Exam:   General:  Pt in NAD, Alert and Awake  Cardiovascular: RRR, no MRG  Respiratory: CTA BL, no wheezes  Abdomen: soft, NT, ND, bruising noted in predominantly at right quadrant  Musculoskeletal: no cyanosis or clubbing   Data Reviewed: Basic Metabolic Panel:  Recent Labs Lab 08/29/13 1341 09/01/13 0210 09/03/13 0415  NA 136 134* 130*  K 5.1 4.0 4.4  CL  --  96 94*  CO2  --  29 24  GLUCOSE 71 83 72  BUN  --  18 49*  CREATININE  --  4.88* 9.25*  CALCIUM  --  8.4 8.7   Liver Function Tests: No results found for this basename: AST, ALT, ALKPHOS, BILITOT, PROT, ALBUMIN,  in the last 168 hours No results found for this basename: LIPASE, AMYLASE,  in the last 168 hours No results found for this basename: AMMONIA,  in the last 168 hours CBC:  Recent Labs Lab 08/29/13 1341 09/01/13 0210 09/02/13 0350 09/03/13 0415  WBC  --  3.2* 3.4* 3.4*  NEUTROABS  --  1.7  --   --   HGB 12.6* 8.4* 7.8* 8.1*  HCT 37.0* 25.7* 24.3* 23.9*  MCV  --  76.5* 75.9* 75.4*  PLT  --  114* 130* 135*   Cardiac Enzymes: No results found for this basename: CKTOTAL, CKMB, CKMBINDEX, TROPONINI,  in the last 168 hours BNP (last 3 results) No results found for this basename: PROBNP,  in the last 8760 hours CBG: No results found for this basename: GLUCAP,  in the last 168 hours  Recent Results (from the past 240 hour(s))  CULTURE, BLOOD (ROUTINE X 2)     Status: None   Collection Time    09/01/13  2:00 AM      Result Value Range Status   Specimen Description BLOOD LEFT ARM   Final   Special Requests BOTTLES DRAWN AEROBIC AND ANAEROBIC 10CC EACH   Final   Culture  Setup Time     Final   Value: 09/01/2013 09:59     Performed at [facility]    Culture     Final   Value:        BLOOD CULTURE RECEIVED NO GROWTH TO DATE CULTURE WILL BE HELD FOR 5 DAYS BEFORE ISSUING A FINAL NEGATIVE REPORT     Performed at [facility]   Report Status PENDING   Incomplete  CULTURE, BLOOD (ROUTINE X 2)     Status: None   Collection Time    09/01/13  2:10 AM      Result Value Range Status   Specimen Description BLOOD LEFT HAND   Final   Special Requests BOTTLES DRAWN AEROBIC ONLY 10CC   Final   Culture  Setup Time     Final   Value: 09/01/2013 10:00     Performed at [facility]   Culture     Final   Value:        BLOOD CULTURE RECEIVED NO GROWTH TO DATE CULTURE WILL BE HELD FOR 5 DAYS BEFORE ISSUING A FINAL NEGATIVE REPORT     Performed at [facility]   Report Status PENDING   Incomplete     Studies: No results found.  Scheduled Meds: . amLODipine  10 mg Oral Daily  . darbepoetin (ARANESP) injection - DIALYSIS  200 mcg Intravenous Q Sat-HD  . labetalol  200 mg Oral BID  . phytonadione  5 mg Oral Once  . sevelamer carbonate  3,200 mg Oral TID WC  . sodium chloride  3 mL Intravenous Q12H   Continuous Infusions:   Principal Problem:   Fever of undetermined origin Active Problems:   HYPERTENSION   SECONDARY HYPERPARATHYROIDISM   ESRD on hemodialysis   Cough   DVT of lower extremity (deep venous thrombosis) RLE   Pulmonary embolism   Viral syndrome   Fever in adult   Hematoma complicating a procedure    Time spent: > 35 minutes    Penny Pia  [facility] Pager [phone]. If 7PM-7AM, please contact night-coverage at [email or web address], password [facility] 09/03/2013, 1:00 PM  LOS: 2 days

## 2013-09-03 NOTE — Progress Notes (Signed)
Dialysis cath in place intact right scapula region.  Hematoma noted distal to dialysis cath.  Physician in to evaluate client.

## 2013-09-04 DIAGNOSIS — J309 Allergic rhinitis, unspecified: Secondary | ICD-10-CM

## 2013-09-04 DIAGNOSIS — L02419 Cutaneous abscess of limb, unspecified: Secondary | ICD-10-CM

## 2013-09-04 LAB — PROTIME-INR
INR: 1.48 (ref 0.00–1.49)
Prothrombin Time: 17.5 seconds — ABNORMAL HIGH (ref 11.6–15.2)

## 2013-09-04 LAB — HEPATITIS B SURFACE ANTIGEN: Hepatitis B Surface Ag: NEGATIVE

## 2013-09-04 LAB — HEPATITIS B SURFACE ANTIBODY,QUALITATIVE: Hep B S Ab: NEGATIVE

## 2013-09-04 MED ORDER — DEXTROSE 5 % IV SOLN
2.0000 g | Freq: Once | INTRAVENOUS | Status: AC
  Administered 2013-09-05: 2 g via INTRAVENOUS
  Filled 2013-09-04 (×2): qty 2

## 2013-09-04 MED ORDER — VANCOMYCIN HCL IN DEXTROSE 1-5 GM/200ML-% IV SOLN
1000.0000 mg | Freq: Once | INTRAVENOUS | Status: DC
  Administered 2013-09-05: 1000 mg via INTRAVENOUS
  Filled 2013-09-04 (×2): qty 200

## 2013-09-04 NOTE — Progress Notes (Signed)
Pt temp 102.9 tylenol given as ordered.  Elray Mcgregor, PA paged.  Will continue to monitor.

## 2013-09-04 NOTE — Progress Notes (Signed)
Admit: 09/01/2013 LOS: 3  23M ESRD TTS [name] via [facility] admitted with Fevers and R back/flank spontaneous hematoma related to anticoagulation.  Subjective:  Pt w/ ongoing fevers, Tmax 102.29F o/n B Cx NGTD On  Vanc and cefepime per pharmacy Pt states that he has fevers when he is dehydrated Pt reports no tenderness or discharge at [facility] exit site or along tract Flu negative  12/27 0701 - 12/28 0700 In: 483 [P.O.:480; I.V.:3] Out: 3000 [Urine:200]  Filed Weights   09/03/13 1405 09/03/13 1840 09/04/13 0547  Weight: 104.7 kg (230 lb 13.2 oz) 101.9 kg (224 lb 10.4 oz) 101.9 kg (224 lb 10.4 oz)    Current meds: reviewed  Current Labs: reviewed    Physical Exam:  Blood pressure 109/75, pulse 86, temperature 99.3 F (37.4 C), temperature source Oral, resp. rate 20, height 6\' 1"  (1.854 m), weight 101.9 kg (224 lb 10.4 oz), SpO2 99.00%. NAD, appears well R thorax/flank hematoma and brusing noted, extensive.  Not particularly tender CTAB. Nl wob RRR. Nl s1s2. EOMI NCAT No rashes/lesions Nonfocal.    LUA AVF +B/T.  Maturing  Dialysis Orders: Center: AF on TTS.  Optiflux 200 EDW 101kg HD Bath 2K/2Ca Time 4:30 Heparin 3200 (change to none at d/c). Access R TDC/ Maturing L AVF placed 10/20  BFR 400 DFR A1.5 [name] 1 mcg IV/HD Epogen 5400 Units IV/HD Venofer 0 (Fe load completed 08/30/13)  Recent labs: Hgb 11, P 3.4, PTH 34  Assessment/Plan 1. ESRD: Plan for next HD on M (TTS outpt but on holiday schedule) 2. HTN/Vol: BP ok.  EDW 102kg next treatment 3. Anemia: follow closely.  On Aranesp 200 qSat 4. CKD-MBD: Ca and Phos acceptable.  Cont Renvela and VDRA  5. Fevers: BC NGTD.  On Vanc and cefepime.  No clear evidence at this time of TDC infection, but remains possible.  If fevers persist and no other source identiifed, would consider TDC exchange.   6. Hematoma: appears stable at this time.  Not particularly tender to suggest developing infection. 7. Hx/o AFib s/ DCCV 12/22.  In  NSR.  No anticoagulation 2/2 #6 8. Hx/o DVT/PE. As per #7  Sabra Heck MD 09/04/2013, 10:05 AM   Recent Labs Lab 08/29/13 1341 09/01/13 0210 09/03/13 0415  NA 136 134* 130*  K 5.1 4.0 4.4  CL  --  96 94*  CO2  --  29 24  GLUCOSE 71 83 72  BUN  --  18 49*  CREATININE  --  4.88* 9.25*  CALCIUM  --  8.4 8.7    Recent Labs Lab 09/01/13 0210 09/02/13 0350 09/03/13 0415  WBC 3.2* 3.4* 3.4*  NEUTROABS 1.7  --   --   HGB 8.4* 7.8* 8.1*  HCT 25.7* 24.3* 23.9*  MCV 76.5* 75.9* 75.4*  PLT 114* 130* 135*

## 2013-09-04 NOTE — Progress Notes (Signed)
ANTIBIOTIC CONSULT NOTE - FOLLOW UP  Pharmacy Consult for vancomycin and cefepime Indication: empiric  No Known Allergies  Patient Measurements: Height: 6\' 1"  (185.4 cm) Weight: 224 lb 10.4 oz (101.9 kg) IBW/kg (Calculated) : 79.9   Vital Signs: Temp: 99.3 F (37.4 C) (12/28 1402) Temp src: Oral (12/28 1402) BP: 98/66 mmHg (12/28 1402) Pulse Rate: 78 (12/28 1402) Intake/Output from previous day: 12/27 0701 - 12/28 0700 In: 483 [P.O.:480; I.V.:3] Out: 3000 [Urine:200] Intake/Output from this shift: Total I/O In: 243 [P.O.:240; I.V.:3] Out: -   Labs:  Recent Labs  09/02/13 0350 09/03/13 0415  WBC 3.4* 3.4*  HGB 7.8* 8.1*  PLT 130* 135*  CREATININE  --  9.25*   Estimated Creatinine Clearance: 10.7 ml/min (by C-G formula based on Cr of 9.25). No results found for this basename: VANCOTROUGH, Leodis Binet, VANCORANDOM, GENTTROUGH, GENTPEAK, GENTRANDOM, TOBRATROUGH, TOBRAPEAK, TOBRARND, AMIKACINPEAK, AMIKACINTROU, AMIKACIN,  in the last 72 hours   Microbiology: Recent Results (from the past 720 hour(s))  CULTURE, BLOOD (ROUTINE X 2)     Status: None   Collection Time    09/01/13  2:00 AM      Result Value Range Status   Specimen Description BLOOD LEFT ARM   Final   Special Requests BOTTLES DRAWN AEROBIC AND ANAEROBIC 10CC EACH   Final   Culture  Setup Time     Final   Value: 09/01/2013 09:59     Performed at [facility]   Culture     Final   Value:        BLOOD CULTURE RECEIVED NO GROWTH TO DATE CULTURE WILL BE HELD FOR 5 DAYS BEFORE ISSUING A FINAL NEGATIVE REPORT     Performed at [facility]   Report Status PENDING   Incomplete  CULTURE, BLOOD (ROUTINE X 2)     Status: None   Collection Time    09/01/13  2:10 AM      Result Value Range Status   Specimen Description BLOOD LEFT HAND   Final   Special Requests BOTTLES DRAWN AEROBIC ONLY 10CC   Final   Culture  Setup Time     Final   Value: 09/01/2013 10:00     Performed at [facility]    Culture     Final   Value:        BLOOD CULTURE RECEIVED NO GROWTH TO DATE CULTURE WILL BE HELD FOR 5 DAYS BEFORE ISSUING A FINAL NEGATIVE REPORT     Performed at [facility]   Report Status PENDING   Incomplete    Anti-infectives   Start     Dose/Rate Route Frequency Ordered Stop   09/03/13 1515  vancomycin (VANCOCIN) IVPB 1000 mg/200 mL premix     1,000 mg 200 mL/hr over 60 Minutes Intravenous To Hemodialysis 09/03/13 1501 09/03/13 1822   09/03/13 1515  ceFEPIme (MAXIPIME) 2 g in dextrose 5 % 50 mL IVPB     2 g 100 mL/hr over 30 Minutes Intravenous To Hemodialysis 09/03/13 1501 09/03/13 2137   09/01/13 0600  oseltamivir (TAMIFLU) capsule 75 mg  Status:  Discontinued     75 mg Oral Every T-Th-Sa (1800) 09/01/13 0549 09/02/13 0940   09/01/13 0600  ceFEPIme (MAXIPIME) 2 g in dextrose 5 % 50 mL IVPB     2 g 100 mL/hr over 30 Minutes Intravenous  Once 09/01/13 0553 09/01/13 1228   09/01/13 0600  vancomycin (VANCOCIN) IVPB 1000 mg/200 mL premix     1,000  mg 200 mL/hr over 60 Minutes Intravenous  Once 09/01/13 0554 09/01/13 1100   09/01/13 0200  vancomycin (VANCOCIN) IVPB 1000 mg/200 mL premix     1,000 mg 200 mL/hr over 60 Minutes Intravenous  Once 09/01/13 0155 09/01/13 0515   09/01/13 0200  gentamicin (GARAMYCIN) IVPB 80 mg     80 mg 100 mL/hr over 30 Minutes Intravenous  Once 09/01/13 0155 09/01/13 0345      Assessment: Patient is 62 y.o M on vancomycin and cefepime day #4 for broad empiric coverage for fever.  Patient remains febrile over the last 24 hours but cultures negative this far. He's currently on the holiday HD schedule with last session on 12/27 (~4hrs, BFR 400) with vancomycin and cefepime maintenance doses given post HD session.  Plan for HD again on 12/29. Spoke to Dr. Cena Benton on 12/27, continue with vancomycin and cefepime for now and ok to d/c gentamicin.  Goal of Therapy:  Pre-HD vancomycin level 15-25  Plan:  1) Vancomycin 1000 mg qHD and cefepime 2gm  QHD-- enter when patient has HD, entered doses for 12/29 2) f/u plan for coumadin   Carley Strickling P 09/04/2013,2:32 PM

## 2013-09-04 NOTE — Progress Notes (Signed)
[facility] PROGRESS NOTE  Trevor [name] MVH:[id number] DOB: 1951/03/25 DOA: 09/01/2013 PCP: Dyke Maes, MD  Assessment/Plan: 1. Fever of undetermined origin - Blood cultures pending but negative to date - Influenza panel negative - Lactic acid level within normal limits - Could very well be from viral etiology but at this point we'll continue broad-spectrum antibiotics according to notes patient will be obtaining these during dialysis sessions - Question whether hematoma is serving as bacterial nidus, continue broad-spectrum antibiotics to be dose by pharmacy during dialysis session - Consider ID consult next a.m. for now continue broad-spectrum antibiotics as listed above  2. Hematoma - Large hematoma within the right lateral and posterior aspect of the right hemithorax. This is been associated with anemia with significant drop. Coumadin currently being held - Continue to reassess CBC - Vitamin K. he has been administered as recommended by oncologist, discussed starting aspirin the patient would like to wait until hemoglobin levels are improved and Coumadin has been reversed  3. history of DVT/PE - Please refer to oncologist/hematologist consult note on 09/02/2013. At this point given anemia and worsening hematoma Coumadin will be held and reversed with vitamin K.  4. Hypertension - On amlodipine and labetalol, blood pressure relatively well controlled on this regimen  5. end-stage renal disease - Neurology on board and plan for dialysis next a.m.  Code Status: Full Family Communication: Discussed with patient directly no family at bedside Disposition Plan: With resolution of fevers and pending culture results as well as recommendations from specialist on board   Consultants:  . Oncology  Nephrology  Procedures:  CT of chest  Antibiotics:  None  HPI/Subjective: Patient still concerned about bruising. The bruising has not gotten worse today. No new  complaints otherwise  Objective: Filed Vitals:   09/04/13 1402  BP: 98/66  Pulse: 78  Temp: 99.3 F (37.4 C)  Resp: 20    Intake/Output Summary (Last 24 hours) at 09/04/13 1624 Last data filed at 09/04/13 1212  Gross per 24 hour  Intake    246 ml  Output   3000 ml  Net  -2754 ml   Filed Weights   09/03/13 1405 09/03/13 1840 09/04/13 0547  Weight: 104.7 kg (230 lb 13.2 oz) 101.9 kg (224 lb 10.4 oz) 101.9 kg (224 lb 10.4 oz)    Exam:   General:  Pt in NAD, Alert and Awake  Cardiovascular: RRR, no MRG  Respiratory: CTA BL, no wheezes  Abdomen: soft, NT, ND, bruising noted in predominantly at right quadrant  Musculoskeletal: no cyanosis or clubbing   Data Reviewed: Basic Metabolic Panel:  Recent Labs Lab 08/29/13 1341 09/01/13 0210 09/03/13 0415  NA 136 134* 130*  K 5.1 4.0 4.4  CL  --  96 94*  CO2  --  29 24  GLUCOSE 71 83 72  BUN  --  18 49*  CREATININE  --  4.88* 9.25*  CALCIUM  --  8.4 8.7   Liver Function Tests: No results found for this basename: AST, ALT, ALKPHOS, BILITOT, PROT, ALBUMIN,  in the last 168 hours No results found for this basename: LIPASE, AMYLASE,  in the last 168 hours No results found for this basename: AMMONIA,  in the last 168 hours CBC:  Recent Labs Lab 08/29/13 1341 09/01/13 0210 09/02/13 0350 09/03/13 0415  WBC  --  3.2* 3.4* 3.4*  NEUTROABS  --  1.7  --   --   HGB 12.6* 8.4* 7.8* 8.1*  HCT 37.0* 25.7* 24.3* 23.9*  MCV  --  76.5* 75.9* 75.4*  PLT  --  114* 130* 135*   Cardiac Enzymes: No results found for this basename: CKTOTAL, CKMB, CKMBINDEX, TROPONINI,  in the last 168 hours BNP (last 3 results) No results found for this basename: PROBNP,  in the last 8760 hours CBG: No results found for this basename: GLUCAP,  in the last 168 hours  Recent Results (from the past 240 hour(s))  CULTURE, BLOOD (ROUTINE X 2)     Status: None   Collection Time    09/01/13  2:00 AM      Result Value Range Status   Specimen  Description BLOOD LEFT ARM   Final   Special Requests BOTTLES DRAWN AEROBIC AND ANAEROBIC 10CC EACH   Final   Culture  Setup Time     Final   Value: 09/01/2013 09:59     Performed at [facility]   Culture     Final   Value:        BLOOD CULTURE RECEIVED NO GROWTH TO DATE CULTURE WILL BE HELD FOR 5 DAYS BEFORE ISSUING A FINAL NEGATIVE REPORT     Performed at [facility]   Report Status PENDING   Incomplete  CULTURE, BLOOD (ROUTINE X 2)     Status: None   Collection Time    09/01/13  2:10 AM      Result Value Range Status   Specimen Description BLOOD LEFT HAND   Final   Special Requests BOTTLES DRAWN AEROBIC ONLY 10CC   Final   Culture  Setup Time     Final   Value: 09/01/2013 10:00     Performed at [facility]   Culture     Final   Value:        BLOOD CULTURE RECEIVED NO GROWTH TO DATE CULTURE WILL BE HELD FOR 5 DAYS BEFORE ISSUING A FINAL NEGATIVE REPORT     Performed at [facility]   Report Status PENDING   Incomplete     Studies: No results found.  Scheduled Meds: . amLODipine  10 mg Oral Daily  . [START ON 09/05/2013] ceFEPime (MAXIPIME) IV  2 g Intravenous Once  . darbepoetin (ARANESP) injection - DIALYSIS  200 mcg Intravenous Q Sat-HD  . doxercalciferol  1 mcg Intravenous Custom  . labetalol  200 mg Oral BID  . sevelamer carbonate  3,200 mg Oral TID WC  . sodium chloride  3 mL Intravenous Q12H  . [START ON 09/05/2013] vancomycin  1,000 mg Intravenous Once   Continuous Infusions:   Principal Problem:   Fever of undetermined origin Active Problems:   HYPERTENSION   SECONDARY HYPERPARATHYROIDISM   ESRD on hemodialysis   Cough   DVT of lower extremity (deep venous thrombosis) RLE   Pulmonary embolism   Viral syndrome   Fever in adult   Hematoma complicating a procedure    Time spent: > 35 minutes    Penny Pia  [facility] Pager [phone]. If 7PM-7AM, please contact night-coverage at [email or web address], password  [facility] 09/04/2013, 4:24 PM  LOS: 3 days

## 2013-09-05 DIAGNOSIS — D509 Iron deficiency anemia, unspecified: Secondary | ICD-10-CM

## 2013-09-05 LAB — CBC
HCT: 24.3 % — ABNORMAL LOW (ref 39.0–52.0)
Hemoglobin: 8.1 g/dL — ABNORMAL LOW (ref 13.0–17.0)
MCH: 25.1 pg — ABNORMAL LOW (ref 26.0–34.0)
MCHC: 33.3 g/dL (ref 30.0–36.0)
MCV: 75.2 fL — ABNORMAL LOW (ref 78.0–100.0)
Platelets: 158 10*3/uL (ref 150–400)
RDW: 18.7 % — ABNORMAL HIGH (ref 11.5–15.5)
WBC: 3.3 10*3/uL — ABNORMAL LOW (ref 4.0–10.5)

## 2013-09-05 LAB — PROTIME-INR: Prothrombin Time: 15.1 seconds (ref 11.6–15.2)

## 2013-09-05 MED ORDER — HEPARIN SODIUM (PORCINE) 1000 UNIT/ML DIALYSIS: 1000.0000 [IU] | INTRAMUSCULAR | Status: DC | PRN

## 2013-09-05 MED ORDER — SODIUM CHLORIDE 0.9 % IV SOLN: 100.0000 mL | INTRAVENOUS | Status: DC | PRN

## 2013-09-05 MED ORDER — NEPRO/CARBSTEADY PO LIQD
237.0000 mL | ORAL | Status: DC | PRN
  Filled 2013-09-05: qty 237

## 2013-09-05 NOTE — Progress Notes (Signed)
Admit: 09/01/2013 LOS: 4  7M ESRD TTS [name] via [facility] admitted with Fevers and R back/flank spontaneous hematoma related to anticoagulation.  Subjective:  Tmax better yesterday at 99.5 B Cx NGTD On  Vanc and cefepime per pharmacy Pt states that he has fevers when he is dehydrated Pt reports no tenderness or discharge at [facility] exit site or along tract Flu negative  12/28 0701 - 12/29 0700 In: 883 [P.O.:880; I.V.:3] Out: 175 [Urine:175]  Filed Weights   09/04/13 0547 09/05/13 0529 09/05/13 0700  Weight: 101.9 kg (224 lb 10.4 oz) 102.785 kg (226 lb 9.6 oz) 102.8 kg (226 lb 10.1 oz)    Current meds: reviewed  Current Labs: reviewed    Physical Exam:  Blood pressure 124/67, pulse 70, temperature 98.4 F (36.9 C), temperature source Oral, resp. rate 18, height 6\' 1"  (1.854 m), weight 102.8 kg (226 lb 10.1 oz), SpO2 99.00%. NAD, appears well R thorax/abd/chest extensive ecchymosis, soft mass R prescapular not tender or warm Exit site R IJ cath clean and dry CTAB. Nl wob RRR. Nl s1s2. EOMI No rashes/lesions Nonfocal.    LUA AVF +B/T.  Maturing  Dialysis Orders: Center: AF on TTS.  F200  4.5hrs  101kg   2K/2.0 [place]  Heparin 3200 (change to none at d/c). Access R TDC (maturing L AVF placed 10/20) BFR 400 DFR A1.5 [name] 1 mcg IV/HD Epogen 5400 Units IV/HD Venofer 0 (Fe load completed 08/30/13)  Recent labs: Hgb 11, P 3.4, PTH 34  Assessment/Plan 1. ESRD: HD today 2. HTN/Vol: BP ok.  EDW 102kg next treatment 3. Anemia: follow closely.  On Aranesp 200 qSat 4. CKD-MBD: Ca and Phos acceptable.  Cont Renvela and VDRA  5. Fevers: BC NGTD.  On Vanc and cefepime.  No clear evidence at this time of TDC infection, but remains possible.  If fevers persist and no other source identiifed, will consider TDC exchange.   6. Hematoma: appears stable at this time.  Not particularly tender to suggest developing infection. 7. Hx/o AFib s/ DCCV 12/22.  In NSR.  No anticoagulation 2/2  #6 8. Hx/o DVT/PE. As per #7  [name] MD (pgr) [phone]    ([phone] 09/05/2013, 9:30 AM   Recent Labs Lab 08/29/13 1341 09/01/13 0210 09/03/13 0415  NA 136 134* 130*  K 5.1 4.0 4.4  CL  --  96 94*  CO2  --  29 24  GLUCOSE 71 83 72  BUN  --  18 49*  CREATININE  --  4.88* 9.25*  CALCIUM  --  8.4 8.7    Recent Labs Lab 09/01/13 0210 09/02/13 0350 09/03/13 0415 09/05/13 0712  WBC 3.2* 3.4* 3.4* 3.3*  NEUTROABS 1.7  --   --   --   HGB 8.4* 7.8* 8.1* 8.1*  HCT 25.7* 24.3* 23.9* 24.3*  MCV 76.5* 75.9* 75.4* 75.2*  PLT 114* 130* 135* 158

## 2013-09-05 NOTE — Care Management Note (Addendum)
  Page 1 of 1   09/05/2013     10:13:46 AM   CARE MANAGEMENT NOTE 09/05/2013  Patient:  Trevor [name], Trevor [name]   Account Number:  [id number]  Date Initiated:  09/05/2013  Documentation initiated by:  [facility]  Subjective/Objective Assessment:   62 y.o. male with hx of ESRD on HD (T Th S, but M W Sat on holiday schedule), hx of anemia, afib w/anticoagulation, s/p cardioversion, hematoma on right thorax prior to cardioversion, hx of gout, presents w/fever, cough, malaise//hm with sp     Action/Plan:   admit him for fever of unclear origin.  He was given Gen and Zenaida Niece IV.//Home with sig. other   Anticipated DC Date:  09/08/2013   Anticipated DC Plan:  HOME W HOME HEALTH SERVICES      DC Planning Services  CM consult      Choice offered to / List presented to:             Status of service:   Medicare Important Message given?   (If response is "NO", the following Medicare IM given date fields will be blank) Date Medicare IM given:   Date Additional Medicare IM given:    Discharge Disposition:    Per UR Regulation:    If discussed at Long Length of Stay Meetings, dates discussed:    Comments:  09/05/13 1000 Oletta Cohn, RN, BSN, [place] [phone]   Name                            Relation                          Home               Mobile   [place],Leisa    Significant other                [phone]    [phone]

## 2013-09-05 NOTE — Consult Note (Addendum)
[facility]     Reason for Consult:fever without a source    Referring Physician: Dr. Cena Benton  Principal Problem:   Fever of undetermined origin Active Problems:   HYPERTENSION   SECONDARY HYPERPARATHYROIDISM   ESRD on hemodialysis   Cough   DVT of lower extremity (deep venous thrombosis) RLE   Pulmonary embolism   Viral syndrome   Fever in adult   Hematoma complicating a procedure   . amLODipine  10 mg Oral Daily  . darbepoetin (ARANESP) injection - DIALYSIS  200 mcg Intravenous Q Sat-HD  . doxercalciferol  1 mcg Intravenous Custom  . labetalol  200 mg Oral BID  . sevelamer carbonate  3,200 mg Oral TID WC  . sodium chloride  3 mL Intravenous Q12H    Recommendations: I will stop antibiotics and observe, if fever and chills start, would consider line replacement/exchange.     Assessment: He has had a fever up to 102, no fever for about 24 hours.  His only source is a dry cough that preceeded the fever.  His fistula and line do not appear to be infected and he has remained hemodynamically stable.  I do not see any source of a bacterial infection with negative blood cultures, no significant rise in WBC, and is more consistent with a viral infection.   Catheter is also possible.  I would hold antibiotics pending identification of a source as long as he remains hemodynamically stable.    Antibiotics: Vancomycin and cefepime day 5  HPI: Trevor [name] is a 62 y.o. male with ESRD with afib, recently with cardioversion and developed a hematoma on right thorax who presented in the am of 12/25 with fever.  He initially noted a fever the day before and was seen in dialysis and sent to ED for evaluation.  He was started empirically on vancomycin and cefepime with concern for bacteremia, infected hematoma and his fever persisted until yesterday.  He did have an infected AV fistula of right arm in August 2014 and with fever was resected, cultures remained negative.   He has had no chills.  He has noted increased weakness associated with the fever. Non productive cough.  Overall he feels much better.     Review of Systems: A comprehensive review of systems was negative.  Past Medical History  Diagnosis Date  . Anemia   . AVF (arteriovenous fistula)     Left  . Secondary hyperparathyroidism   . Hypovitaminosis D   . Hypertensive urgency     H/o  . CHF (congestive heart failure)   . Exertional shortness of breath     "related to infection in my lungs right now" (05/03/2013)  . History of gout     "before I started doing the dialysis" (05/03/2013)  . ESRD (end stage renal disease) on dialysis     "[facility]; MWF" (05/03/2013)  . Sleep apnea   . GERD (gastroesophageal reflux disease)   . Headache(784.0)   . Syncope     felt secondary to residual anesthesia the day before - 2D echo unremarkable  . Pulmonary embolism     with right DVT secondary to recent surgery  . Dysrhythmia     Hx: aflutter  . Atrial fibrillation     on coumadin  . Prolonged Q-T interval on ECG     History  Substance Use Topics  . Smoking status: Former Smoker -- 0.25 packs/day for .5 years    Types: Cigarettes  Quit date: 09/08/1972  . Smokeless tobacco: Never Used  . Alcohol Use: Yes     Comment: 05/03/2013 "haven't had a glass of wine in ~ 3 months or so; sometimes will have one w/dinner"    Family History  Problem Relation Age of Onset  . Hypertension Father   . Kidney disease Father   . Allergies Father   . Deep vein thrombosis Sister   . Pulmonary embolism Sister   . Diabetes Paternal Grandmother    No Known Allergies  OBJECTIVE: Blood pressure 118/63, pulse 86, temperature 98.4 F (36.9 C), temperature source Oral, resp. rate 18, height 6\' 1"  (1.854 m), weight 226 lb 10.1 oz (102.8 kg), SpO2 99.00%. General: Awake, alert, nad Skin: no rashes, some hyperpigmented areas on back that are macular, 4-5 mm, circular Lungs: CTA B Cor: RRR without  m Abdomen: soft, nt, nd, +bs Ext: no edema Lines: right port covered, no tenderness, left fistula without erytjema, no warmth, no tenderness  Microbiology: Recent Results (from the past 240 hour(s))  CULTURE, BLOOD (ROUTINE X 2)     Status: None   Collection Time    09/01/13  2:00 AM      Result Value Range Status   Specimen Description BLOOD LEFT ARM   Final   Special Requests BOTTLES DRAWN AEROBIC AND ANAEROBIC 10CC EACH   Final   Culture  Setup Time     Final   Value: 09/01/2013 09:59     Performed at [facility]   Culture     Final   Value:        BLOOD CULTURE RECEIVED NO GROWTH TO DATE CULTURE WILL BE HELD FOR 5 DAYS BEFORE ISSUING A FINAL NEGATIVE REPORT     Performed at [facility]   Report Status PENDING   Incomplete  CULTURE, BLOOD (ROUTINE X 2)     Status: None   Collection Time    09/01/13  2:10 AM      Result Value Range Status   Specimen Description BLOOD LEFT HAND   Final   Special Requests BOTTLES DRAWN AEROBIC ONLY 10CC   Final   Culture  Setup Time     Final   Value: 09/01/2013 10:00     Performed at [facility]   Culture     Final   Value:        BLOOD CULTURE RECEIVED NO GROWTH TO DATE CULTURE WILL BE HELD FOR 5 DAYS BEFORE ISSUING A FINAL NEGATIVE REPORT     Performed at [facility]   Report Status PENDING   Incomplete    Tacuma Graffam, Molly Maduro, MD [facility] [email or web address] [phone] pager  [phone] cell 09/05/2013, 10:56 AM

## 2013-09-05 NOTE — Progress Notes (Signed)
IP PROGRESS NOTE  Subjective:   He continues to have soreness at the right chest. The fever has been lower over the past 24 hours.  Objective: Vital signs in last 24 hours: Blood pressure 121/73, pulse 73, temperature 98.4 F (36.9 C), temperature source Oral, resp. rate 18, height 6\' 1"  (1.854 m), weight 226 lb 10.1 oz (102.8 kg), SpO2 99.00%.  Intake/Output from previous day: 12/28 0701 - 12/29 0700 In: 883 [P.O.:880; I.V.:3] Out: 175 [Urine:175]  Physical Exam:  Musculoskeletal: Hematoma at the right chest wall with an ecchymosis extending over the right abdomen-stable Extremities: No leg edema   Portacath/PICC-without erythema  Lab Results:  Recent Labs  09/03/13 0415 09/05/13 0712  WBC 3.4* 3.3*  HGB 8.1* 8.1*  HCT 23.9* 24.3*  PLT 135* 158    BMET  Recent Labs  09/03/13 0415  NA 130*  K 4.4  CL 94*  CO2 24  GLUCOSE 72  BUN 49*  CREATININE 9.25*  CALCIUM 8.7   PT/INR-1.22  Studies/Results: No results found.  Medications: I have reviewed the patient's current medications.  Assessment/Plan: 1. Fever-etiology unclear, cultures negative to date 2. Chronic cough 3. Right chest wall hematoma-stable, stable hemoglobin 4. Anemia secondary to the right chest wall hematoma and chronic renal failure-stable 5. Right leg DVT October 2014, question of pulmonary embolism October 2014-now off of anticoagulation 6. Status post cardioversion 08/29/2013 7. End-stage renal disease  He appears stable. The PT is now normal and the hemoglobin is stable. The DVT and questionable pulmonary embolism in October of 2014 followed surgical procedures in hospital admission. I recommend discontinuing anticoagulation for now. Cardiology and decide on the indication for aspirin therapy with a history of atrial fibrillation and recent cardioversion.  Recommendations:  1. Workup of the fever per internal medicine and nephrology 2. Hold Coumadin anticoagulation 3. Obtain  outpatient hypercoagulation panel-reconsult hematology if there is a positive result 4. Consult cardiology to discuss the indication for aspirin and need to consider short-term anticoagulation following the recent cardioversion  Please call hematology as needed. I will sign off the case.    LOS: 4 days   Trevor [name]  09/05/2013, 8:27 AM

## 2013-09-05 NOTE — Progress Notes (Signed)
[facility] PROGRESS NOTE  Trevor [name] ZOX:[id number] DOB: 1950/12/21 DOA: 09/01/2013 PCP: Dyke Maes, MD  Assessment/Plan: 1. Fever of undetermined origin - Blood cultures pending but negative to date - Influenza panel negative - Lactic acid level within normal limits - Infectious disease consulted and I appreciate their assistance with this case. Agree with discontinuing antibiotics at this juncture and monitoring vital signs.  2. Hematoma - Large hematoma within the right lateral and posterior aspect of the right hemithorax. This is been associated with anemia with significant drop. Coumadin currently being held - Continue to reassess CBC, and currently stable at 8.1 - Vitamin K. he has been administered as recommended by oncologist, discussed starting aspirin the patient would like to wait until hemoglobin levels are improved and Coumadin has been reversed  3. history of DVT/PE - Please refer to oncologist/hematologist consult note on 09/02/2013. At this point given anemia and worsening hematoma Coumadin was reversed with vitamin K.  4. Hypertension - On amlodipine and labetalol, blood pressure relatively well controlled on this regimen  5. end-stage renal disease - Neurology on board and plan for dialysis next a.m.  Code Status: Full Family Communication: Discussed with patient directly no family at bedside Disposition Plan: With resolution of fevers and pending culture results as well as recommendations from specialist on board   Consultants:  . Oncology  Nephrology  Infectious disease  Procedures:  CT of chest  Antibiotics:  None  HPI/Subjective: No new complaints today. No acute issues reported overnight by patient.  Objective: Filed Vitals:   09/05/13 1433  BP: 109/64  Pulse: 82  Temp: 99.4 F (37.4 C)  Resp: 18    Intake/Output Summary (Last 24 hours) at 09/05/13 1648 Last data filed at 09/05/13 1300  Gross per 24 hour  Intake     880 ml  Output    175 ml  Net    705 ml   Filed Weights   09/05/13 0529 09/05/13 0700 09/05/13 1130  Weight: 102.785 kg (226 lb 9.6 oz) 102.8 kg (226 lb 10.1 oz) 102.2 kg (225 lb 5 oz)    Exam:   General:  Pt in NAD, Alert and Awake  Cardiovascular: RRR, no MRG  Respiratory: CTA BL, no wheezes  Abdomen: soft, NT, ND, bruising noted in predominantly at right quadrant  Musculoskeletal: no cyanosis or clubbing   Data Reviewed: Basic Metabolic Panel:  Recent Labs Lab 09/01/13 0210 09/03/13 0415  NA 134* 130*  K 4.0 4.4  CL 96 94*  CO2 29 24  GLUCOSE 83 72  BUN 18 49*  CREATININE 4.88* 9.25*  CALCIUM 8.4 8.7   Liver Function Tests: No results found for this basename: AST, ALT, ALKPHOS, BILITOT, PROT, ALBUMIN,  in the last 168 hours No results found for this basename: LIPASE, AMYLASE,  in the last 168 hours No results found for this basename: AMMONIA,  in the last 168 hours CBC:  Recent Labs Lab 09/01/13 0210 09/02/13 0350 09/03/13 0415 09/05/13 0712  WBC 3.2* 3.4* 3.4* 3.3*  NEUTROABS 1.7  --   --   --   HGB 8.4* 7.8* 8.1* 8.1*  HCT 25.7* 24.3* 23.9* 24.3*  MCV 76.5* 75.9* 75.4* 75.2*  PLT 114* 130* 135* 158   Cardiac Enzymes: No results found for this basename: CKTOTAL, CKMB, CKMBINDEX, TROPONINI,  in the last 168 hours BNP (last 3 results) No results found for this basename: PROBNP,  in the last 8760 hours CBG: No results found for this basename: GLUCAP,  in the last 168 hours  Recent Results (from the past 240 hour(s))  CULTURE, BLOOD (ROUTINE X 2)     Status: None   Collection Time    09/01/13  2:00 AM      Result Value Range Status   Specimen Description BLOOD LEFT ARM   Final   Special Requests BOTTLES DRAWN AEROBIC AND ANAEROBIC 10CC EACH   Final   Culture  Setup Time     Final   Value: 09/01/2013 09:59     Performed at [facility]   Culture     Final   Value:        BLOOD CULTURE RECEIVED NO GROWTH TO DATE CULTURE WILL BE HELD  FOR 5 DAYS BEFORE ISSUING A FINAL NEGATIVE REPORT     Performed at [facility]   Report Status PENDING   Incomplete  CULTURE, BLOOD (ROUTINE X 2)     Status: None   Collection Time    09/01/13  2:10 AM      Result Value Range Status   Specimen Description BLOOD LEFT HAND   Final   Special Requests BOTTLES DRAWN AEROBIC ONLY 10CC   Final   Culture  Setup Time     Final   Value: 09/01/2013 10:00     Performed at [facility]   Culture     Final   Value:        BLOOD CULTURE RECEIVED NO GROWTH TO DATE CULTURE WILL BE HELD FOR 5 DAYS BEFORE ISSUING A FINAL NEGATIVE REPORT     Performed at [facility]   Report Status PENDING   Incomplete     Studies: No results found.  Scheduled Meds: . amLODipine  10 mg Oral Daily  . darbepoetin (ARANESP) injection - DIALYSIS  200 mcg Intravenous Q Sat-HD  . doxercalciferol  1 mcg Intravenous Custom  . labetalol  200 mg Oral BID  . sevelamer carbonate  3,200 mg Oral TID WC  . sodium chloride  3 mL Intravenous Q12H   Continuous Infusions:   Principal Problem:   Fever of undetermined origin Active Problems:   HYPERTENSION   SECONDARY HYPERPARATHYROIDISM   ESRD on hemodialysis   Cough   DVT of lower extremity (deep venous thrombosis) RLE   Pulmonary embolism   Viral syndrome   Fever in adult   Hematoma complicating a procedure    Time spent: > 35 minutes    Penny Pia  [facility] Pager [phone]. If 7PM-7AM, please contact night-coverage at [email or web address], password [facility] 09/05/2013, 4:48 PM  LOS: 4 days

## 2013-09-05 NOTE — Procedures (Signed)
I was present at this dialysis session, have reviewed the session itself and made  appropriate changes  Rob Cozette Braggs MD (pgr) [phone]    (c) [phone] 09/05/2013, 9:32 AM   

## 2013-09-06 ENCOUNTER — Inpatient Hospital Stay (HOSPITAL_COMMUNITY): Payer: Medicare Other

## 2013-09-06 LAB — PROTIME-INR: INR: 1.21 (ref 0.00–1.49)

## 2013-09-06 LAB — ANGIOTENSIN CONVERTING ENZYME: Angiotensin-Converting Enzyme: 69 U/L — ABNORMAL HIGH (ref 8–52)

## 2013-09-06 MED ORDER — ASPIRIN EC 81 MG PO TBEC: 81.0000 mg | DELAYED_RELEASE_TABLET | Freq: Every day | ORAL | Status: DC

## 2013-09-06 MED ORDER — NEPRO/CARBSTEADY PO LIQD
237.0000 mL | Freq: Every day | ORAL | Status: DC
  Filled 2013-09-06: qty 237

## 2013-09-06 NOTE — Progress Notes (Signed)
NUTRITION FOLLOW-UP  DOCUMENTATION CODES Per approved criteria  -Obesity Unspecified   INTERVENTION: Add Nepro Shake po daily, each supplement provides 425 kcal and 19 grams protein. Recommend Renal MVI. RD to continue to follow nutrition care plan.  NUTRITION DIAGNOSIS: Increased nutrient needs related to hemodialysis as evidenced by estimated needs. Ongoing.  Goal: Pt to meet >/= 90% of their estimated nutrition needs   Monitor:  PO intake Weight trends Labs  ASSESSMENT: 62 y.o. male ESRD patient (TTS HD) with past medical history significant for hypertension, DVT and pulmonary embolus on anticoagulation and afib cardioversion on 12/22 who presented to the ED on 12/25 with complaints of cough, malaise and fever for 2 days.  Infectious disease consulted to assess ongoing fever. MD recommended holding antibiotics and observe pt. Pt's fever has resolved (so far) and pt is off antibiotics, MD suspects likely viral syndrome as pt has had no positive cultures to date.  Pt with adequate oral intake of 100% at this time. States that he doesn't really care for the meats, would like to receive scheduled Nepro daily to help with his oral intake. Will order at this time.  Height: Ht Readings from Last 1 Encounters:  09/01/13 6\' 1"  (1.854 m)    Weight: Wt Readings from Last 1 Encounters:  09/06/13 228 lb 2.8 oz (103.5 kg)  Admit wt 221 lb - wt down 7 lb  BMI:  Body mass index is 30.11 kg/(m^2). Obese Class I  Estimated Nutritional Needs: Kcal: 2200-2400 Protein: 105 grams Fluid: 1200 ml fluid restriction per MD  Skin: non-pitting RLE and LLE edema; intact  Diet Order: Renal  EDUCATION NEEDS: -No education needs identified at this time   Intake/Output Summary (Last 24 hours) at 09/06/13 1413 Last data filed at 09/06/13 1100  Gross per 24 hour  Intake   1140 ml  Output    700 ml  Net    440 ml    Last BM: 12/29  Labs:   Recent Labs Lab 09/01/13 0210  09/03/13 0415  NA 134* 130*  K 4.0 4.4  CL 96 94*  CO2 29 24  BUN 18 49*  CREATININE 4.88* 9.25*  CALCIUM 8.4 8.7  GLUCOSE 83 72    CBG (last 3)  No results found for this basename: GLUCAP,  in the last 72 hours  Scheduled Meds: . amLODipine  10 mg Oral Daily  . darbepoetin (ARANESP) injection - DIALYSIS  200 mcg Intravenous Q Sat-HD  . doxercalciferol  1 mcg Intravenous Custom  . labetalol  200 mg Oral BID  . sevelamer carbonate  3,200 mg Oral TID WC  . sodium chloride  3 mL Intravenous Q12H    Continuous Infusions:   Jarold Motto MS, RD, LDN Pager: [phone] After-hours pager: [phone]

## 2013-09-06 NOTE — Discharge Summary (Signed)
Physician Discharge Summary  Trevor [name] WUJ:[id number] DOB: [birth year] DOA: 09/01/2013  PCP: Dyke Maes, MD  Admit date: 09/01/2013 Discharge date: 09/06/2013  Time spent: > 35 minutes  Recommendations for Outpatient Follow-up:  1. Patient will need to discuss anticoagulation options moving forward. He currently wishes to take aspirin and does not wish for any other anticoagulation medication due to his recent anemia and bruising. 2. Patient is to continue routine dialysis sessions 3. Reassess hemoglobin on next visit ideally with nephrologist on next hemodialysis session  Discharge Diagnoses:  Principal Problem:   Fever of undetermined origin Active Problems:   HYPERTENSION   SECONDARY HYPERPARATHYROIDISM   ESRD on hemodialysis   Cough   DVT of lower extremity (deep venous thrombosis) RLE   Pulmonary embolism   Viral syndrome   Fever in adult   Hematoma complicating a procedure   Discharge Condition: Stable  Diet recommendation: Renal diet  Filed Weights   09/05/13 0700 09/05/13 1130 09/06/13 0542  Weight: 102.8 kg (226 lb 10.1 oz) 102.2 kg (225 lb 5 oz) 103.5 kg (228 lb 2.8 oz)    History of present illness:  Patient is a 62 year old on end-stage renal disease on hemodialysis, history of anemia, atrial fibrillation status post cardioversion and Coumadin, and history of DVT with suspected small PE diagnosed October 2014 per records. And hemoglobin of 8.4 which compared to prior hemoglobin levels, prior to hospitalization, were significantly lower. At that time was also diagnosed with hematoma.  Hospital Course:  1. Fever of undetermined origin - Influenza panel negative  - Lactic acid level within normal limits  - Infectious disease consulted and patient has been taken off of broad-spectrum antibiotics and is afebrile. Suspicion is for viral etiology as cause of fevers. Blood cultures remain negative  2. Hematoma  - Discussed anticoagulation options in lieu  of hematoma and recent anemia while on Coumadin. At this point patient does not wish to be on Coumadin and would prefer aspirin. He understands the risks but given his hematoma and worsening anemia feels that his risks for getting worse on the Coumadin are currently higher. I will have patient discuss further with primary care physician as outpatient. Patient is agreeable to this plan and is agreeable to taking aspirin 81 mg by mouth daily  3. history of DVT/PE  - Patient would prefer to be on aspirin at this juncture. Please see above  4. Hypertension  - On amlodipine and labetalol, blood pressure relatively well controlled on this regimen   5. end-stage renal disease  - Neurology on board and plan for dialysis next a.m.   Procedures:  None  Consultations:  Oncology: Dr. Alcide Evener  Nephrology  Infectious disease  Discharge Exam: Filed Vitals:   09/06/13 1300  BP: 113/78  Pulse: 78  Temp: 97.9 F (36.6 C)  Resp: 20    General: Pt in NAD, Alert and awake Cardiovascular: RRR, no MRG Respiratory: CTA BL, no wheezes  Discharge Instructions  Discharge Orders   Future Appointments Provider Department Dept Phone   09/07/2013 12:15 PM [facility] [place] Office [phone]   09/12/2013 11:00 AM [name], MD [facility] [phone]   09/13/2013 2:20 PM Beatrice Lecher, PA-C [facility] [place] Office [phone]   Future Orders Complete By Expires   Call MD for:  difficulty breathing, headache or visual disturbances  As directed    Call MD for:  temperature >100.4  As directed    Diet -  low sodium heart healthy  As directed    Discharge instructions  As directed    Comments:     Please be sure to continue followup with a nephrologist also follow up with your primary care physician for further recommendations regarding her anticoagulation moving forward   Increase activity slowly  As directed        Medication List     STOP taking these medications       warfarin 5 MG tablet  Commonly known as:  COUMADIN      TAKE these medications       amLODipine 10 MG tablet  Commonly known as:  NORVASC  Take 10 mg by mouth daily.     Hydrocodone-Acetaminophen 10-300 MG Tabs  Take 10-300 mg by mouth.     labetalol 200 MG tablet  Commonly known as:  NORMODYNE  Take 200 mg by mouth 2 (two) times daily.     sevelamer carbonate 800 MG tablet  Commonly known as:  RENVELA  Take 3,200 mg by mouth as needed (for kidneys).       No Known Allergies    The results of significant diagnostics from this hospitalization (including imaging, microbiology, ancillary and laboratory) are listed below for reference.    Significant Diagnostic Studies: Dg Chest 2 View  09/06/2013   CLINICAL DATA:  Cough  EXAM: CHEST  2 VIEW  COMPARISON:  06/08/2013  FINDINGS: The cardiac silhouette is enlarged. A right internal jugular dialysis catheter is appreciated tip projecting region of the superior vena caval right atrial junction and right atrium. There is diffuse prominence of the interstitial markings primarily in perihilar regions. Mild peribronchial prominence is identified. No focal regions of consolidation appreciated. Atherosclerotic calcifications identified within the arch of the aorta. The osseous structures unremarkable.  IMPRESSION: Pulmonary vascular congestion/mild edema. No focal regions of consolidation. Cardiomegaly   Electronically Signed   By: Salome Holmes M.D.   On: 09/06/2013 12:28   Ct Chest W Contrast  09/01/2013   CLINICAL DATA:  Cough and fever of unknown origin.  EXAM: CT CHEST WITH CONTRAST  TECHNIQUE: Multidetector CT imaging of the chest was performed during intravenous contrast administration.  CONTRAST:  75mL OMNIPAQUE IOHEXOL 300 MG/ML  SOLN  COMPARISON:  Prior CT from 06/28/2013  FINDINGS: Extensive bulky partially calcified mediastinal and hilar adenopathy is similar as compared to the prior exam,  presumably from old granulomatous disease. Bulky adenopathy within the upper abdomen is also similar.  Aorta and great vessels are unremarkable. Scattered calcified plaque noted within the aortic arch. Heart size is unchanged. Reflux of contrast into the hepatic veins and IVC is suggestive of right heart failure. Small pericardial effusion is present.  Pulmonary arteries are grossly unremarkable. No definite filling defects identified, although the study is not optimized for evaluation of pulmonary embolism.  Granuloma with associated irregular opacity within the right middle lobe is unchanged. No focal infiltrate identified to suggest pneumonia. No pulmonary edema or pleural effusion. Irregular biapical pleural thickening again noted.  Visualized portions of the upper abdomen is grossly unremarkable.  No acute osseous abnormality identified.  Large mixed density soft tissue mass is seen within the right lateral and posterior aspect of the chest (series 2, image 36). This lesion measures 21.0 x 16.0 x 7.7 cm. Finding is consistent with known hematoma within this region. No soft tissue emphysema. There is adjacent anasarca within the subcutaneous fat of the right hemi thorax.  IMPRESSION: 1. Large hematoma within the right lateral  and posterior aspect of the right hemi thorax, incompletely characterized on this examination. 2. Similar appearance of bulky calcified mediastinal and hilar adenopathy, presumably related to prior granulomatous infection. 3. Stable calcified granuloma with associated irregular opacity within the right middle lobe. No focal infiltrate to suggest pneumonia identified. 4. Small pericardial effusion. 5. Stable cardiomegaly. Reflux of contrast into the IVC and hepatic veins suggests right heart failure.   Electronically Signed   By: Rise Mu M.D.   On: 09/01/2013 04:09    Microbiology: Recent Results (from the past 240 hour(s))  CULTURE, BLOOD (ROUTINE X 2)     Status: None    Collection Time    09/01/13  2:00 AM      Result Value Range Status   Specimen Description BLOOD LEFT ARM   Final   Special Requests BOTTLES DRAWN AEROBIC AND ANAEROBIC 10CC EACH   Final   Culture  Setup Time     Final   Value: 09/01/2013 09:59     Performed at [facility]   Culture     Final   Value:        BLOOD CULTURE RECEIVED NO GROWTH TO DATE CULTURE WILL BE HELD FOR 5 DAYS BEFORE ISSUING A FINAL NEGATIVE REPORT     Performed at [facility]   Report Status PENDING   Incomplete  CULTURE, BLOOD (ROUTINE X 2)     Status: None   Collection Time    09/01/13  2:10 AM      Result Value Range Status   Specimen Description BLOOD LEFT HAND   Final   Special Requests BOTTLES DRAWN AEROBIC ONLY 10CC   Final   Culture  Setup Time     Final   Value: 09/01/2013 10:00     Performed at [facility]   Culture     Final   Value:        BLOOD CULTURE RECEIVED NO GROWTH TO DATE CULTURE WILL BE HELD FOR 5 DAYS BEFORE ISSUING A FINAL NEGATIVE REPORT     Performed at [facility]   Report Status PENDING   Incomplete     Labs: Basic Metabolic Panel:  Recent Labs Lab 09/01/13 0210 09/03/13 0415  NA 134* 130*  K 4.0 4.4  CL 96 94*  CO2 29 24  GLUCOSE 83 72  BUN 18 49*  CREATININE 4.88* 9.25*  CALCIUM 8.4 8.7   Liver Function Tests: No results found for this basename: AST, ALT, ALKPHOS, BILITOT, PROT, ALBUMIN,  in the last 168 hours No results found for this basename: LIPASE, AMYLASE,  in the last 168 hours No results found for this basename: AMMONIA,  in the last 168 hours CBC:  Recent Labs Lab 09/01/13 0210 09/02/13 0350 09/03/13 0415 09/05/13 0712  WBC 3.2* 3.4* 3.4* 3.3*  NEUTROABS 1.7  --   --   --   HGB 8.4* 7.8* 8.1* 8.1*  HCT 25.7* 24.3* 23.9* 24.3*  MCV 76.5* 75.9* 75.4* 75.2*  PLT 114* 130* 135* 158   Cardiac Enzymes: No results found for this basename: CKTOTAL, CKMB, CKMBINDEX, TROPONINI,  in the last 168 hours BNP: BNP (last 3  results) No results found for this basename: PROBNP,  in the last 8760 hours CBG: No results found for this basename: GLUCAP,  in the last 168 hours     Signed:  Penny Pia  [facility] 09/06/2013, 4:17 PM

## 2013-09-06 NOTE — Progress Notes (Signed)
[facility]  Date of Admission:  09/01/2013  Antibiotics: Antibiotics Given (last 72 hours)   Date/Time Action Medication Dose Rate   09/03/13 1722 Given   vancomycin (VANCOCIN) IVPB 1000 mg/200 mL premix 1,000 mg 200 mL/hr   09/03/13 2107 Given   ceFEPIme (MAXIPIME) 2 g in dextrose 5 % 50 mL IVPB 2 g 100 mL/hr   09/05/13 1002 Given  [given in dialysis.]   vancomycin (VANCOCIN) IVPB 1000 mg/200 mL premix 1,000 mg 200 mL/hr   09/05/13 1002 Given  [given in dialysis.]   ceFEPIme (MAXIPIME) 2 g in dextrose 5 % 50 mL IVPB 2 g 100 mL/hr      Subjective: No complaints, no fever, no chills  Objective: Temp:  [97.9 F (36.6 C)-99.4 F (37.4 C)] 97.9 F (36.6 C) (12/30 0952) Pulse Rate:  [78-84] 78 (12/30 0952) Resp:  [18-22] 20 (12/30 0542) BP: (109-128)/(63-79) 111/65 mmHg (12/30 0952) SpO2:  [94 %-99 %] 99 % (12/30 0542) Weight:  [225 lb 5 oz (102.2 kg)-228 lb 2.8 oz (103.5 kg)] 228 lb 2.8 oz (103.5 kg) (12/30 0542)  General: Awake, alert, nad Skin: no rashes Lungs: CTA B Cor: RRR Abdomen: soft, nt, nd Ext: left arm fistula without erythema, no warmth LInes: HD cath covered, nt  Lab Results Lab Results  Component Value Date   WBC 3.3* 09/05/2013   HGB 8.1* 09/05/2013   HCT 24.3* 09/05/2013   MCV 75.2* 09/05/2013   PLT 158 09/05/2013    Lab Results  Component Value Date   CREATININE 9.25* 09/03/2013   BUN 49* 09/03/2013   NA 130* 09/03/2013   K 4.4 09/03/2013   CL 94* 09/03/2013   CO2 24 09/03/2013    Lab Results  Component Value Date   ALT 45 07/04/2013   AST 35 07/04/2013   ALKPHOS 123* 07/04/2013   BILITOT 0.7 07/04/2013      Microbiology: Recent Results (from the past 240 hour(s))  CULTURE, BLOOD (ROUTINE X 2)     Status: None   Collection Time    09/01/13  2:00 AM      Result Value Range Status   Specimen Description BLOOD LEFT ARM   Final   Special Requests BOTTLES DRAWN AEROBIC AND ANAEROBIC 10CC EACH   Final   Culture  Setup Time     Final   Value: 09/01/2013 09:59     Performed at [facility]   Culture     Final   Value:        BLOOD CULTURE RECEIVED NO GROWTH TO DATE CULTURE WILL BE HELD FOR 5 DAYS BEFORE ISSUING A FINAL NEGATIVE REPORT     Performed at [facility]   Report Status PENDING   Incomplete  CULTURE, BLOOD (ROUTINE X 2)     Status: None   Collection Time    09/01/13  2:10 AM      Result Value Range Status   Specimen Description BLOOD LEFT HAND   Final   Special Requests BOTTLES DRAWN AEROBIC ONLY 10CC   Final   Culture  Setup Time     Final   Value: 09/01/2013 10:00     Performed at [facility]   Culture     Final   Value:        BLOOD CULTURE RECEIVED NO GROWTH TO DATE CULTURE WILL BE HELD FOR 5 DAYS BEFORE ISSUING A FINAL NEGATIVE REPORT     Performed at [facility]  Report Status PENDING   Incomplete    Studies/Results: No results found.  Assessment/Plan: 1) fever - resolved so far.  Off antibiotics.  Likely viral syndrome.  Negative cultures to date.    Trevor Righter, MD [facility] [email or web address] [phone] pager   [phone] cell 09/06/2013, 11:04 AM

## 2013-09-06 NOTE — Progress Notes (Addendum)
Admit: 09/01/2013 LOS: 5  15M ESRD TTS [name] via [facility] admitted with Fevers and R back/flank spontaneous hematoma related to anticoagulation.  Subjective:  Afebrile > 24 hrs BCx NGTD Abx stopped yest after ID rec's Flu negative Still dry cough, some whitish phlegm  Physical Exam:  Blood pressure 111/65, pulse 78, temperature 97.9 F (36.6 C), temperature source Oral, resp. rate 20, height 6\' 1"  (1.854 m), weight 103.5 kg (228 lb 2.8 oz), SpO2 99.00%. NAD, appears well R thorax/abd/chest extensive ecchymosis, soft mass R prescapular not tender or warm Exit site R IJ cath clean and dry CTAB. Nl wob RRR. Nl s1s2. EOMI No rashes/lesions Nonfocal.    LUA AVF +B/T.  Maturing  Dialysis:  [facility] TTS F200  4.5hrs  101kg   2K/2.0 [place]  Heparin 3200 (change to none at d/c). Access R TDC (maturing L AVF placed 10/20) BFR 400 DFR A1.5 [name] 1 mcg IV/HD Epogen 5400 Units IV/HD Venofer 0 (Fe load completed 08/30/13)  Recent labs: Hgb 11, P 3.4, PTH 34  Assessment/Plan: 1. ESRD: HD MWF 2. HTN/Vol: BP ok.  Increase EDW 102kg next treatment 3. Anemia: follow closely.  On Aranesp 200 qSat 4. CKD-MBD: Ca and Phos acceptable.  Cont Renvela and VDRA  5. Fevers: BC NGTD, no clear evidence of bacterial infection per ID, abx stopped per ID recommendations. Possible viral illness.  Has HD cath, consider replacing if fevers recur related to dialysis.   6. R chest/back hematoma: appears stable at this time 7. Hx/o AFib s/ DCCV 12/22.  In NSR.  No anticoagulation 2/2 #6 8. Hx post tibial DVT w possible "very small" PE Oct 2014; coumadin on hold due to soft tissue bleed 9. Hx pulm HTN by echo's in chart 10. Hx cough syncope Oct 2012, workup negative; still coughing, will get CXR 11. 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 Abdominal LAN- bulky per radiology, ?etiology  Trevor Moselle  MD (pgr) [phone]    ([phone] 09/06/2013, 10:07 AM   Recent Labs Lab 09/01/13 0210 09/03/13 0415  NA 134* 130*  K 4.0 4.4  CL 96 94*  CO2 29 24  GLUCOSE 83 72  BUN 18 49*  CREATININE 4.88* 9.25*  CALCIUM 8.4 8.7    Recent Labs Lab 09/01/13 0210 09/02/13 0350 09/03/13 0415 09/05/13 0712  WBC 3.2* 3.4* 3.4* 3.3*  NEUTROABS 1.7  --   --   --   HGB 8.4* 7.8* 8.1* 8.1*  HCT 25.7* 24.3* 23.9* 24.3*  MCV 76.5* 75.9* 75.4* 75.2*  PLT 114* 130* 135* 158

## 2013-09-06 NOTE — Progress Notes (Addendum)
Pt a/o, no c/o pain, oob ad lib, vss, pt stable,

## 2013-09-06 NOTE — Progress Notes (Signed)
Pt being dc to home, dc instructions given to pt, medications and follow up appointments reviewed, pt verbalized understanding, pt leaving via wheelchair with family, pt stable

## 2013-09-07 ENCOUNTER — Telehealth: Payer: Self-pay | Admitting: Cardiology

## 2013-09-07 DIAGNOSIS — N186 End stage renal disease: Secondary | ICD-10-CM | POA: Diagnosis not present

## 2013-09-07 LAB — CULTURE, BLOOD (ROUTINE X 2)

## 2013-09-07 NOTE — Telephone Encounter (Signed)
FYI   Pt called to cancel 12/31 coumadin appt, hosp. took pt off coumadin / notes on file Dr Cena Benton

## 2013-09-07 NOTE — Telephone Encounter (Signed)
FYI to [name].  Patient and Dr. Cena Benton discussed coumadin in hospital, and given his h/o anemia and recent hematoma, patient elected to use aspirin instead.  Patient had successful cardioversion recently as well.

## 2013-09-08 DIAGNOSIS — N186 End stage renal disease: Secondary | ICD-10-CM | POA: Diagnosis not present

## 2013-09-08 DIAGNOSIS — N039 Chronic nephritic syndrome with unspecified morphologic changes: Secondary | ICD-10-CM | POA: Diagnosis not present

## 2013-09-08 DIAGNOSIS — D631 Anemia in chronic kidney disease: Secondary | ICD-10-CM | POA: Diagnosis not present

## 2013-09-08 DIAGNOSIS — N2581 Secondary hyperparathyroidism of renal origin: Secondary | ICD-10-CM | POA: Diagnosis not present

## 2013-09-10 DIAGNOSIS — D631 Anemia in chronic kidney disease: Secondary | ICD-10-CM | POA: Diagnosis not present

## 2013-09-10 DIAGNOSIS — N186 End stage renal disease: Secondary | ICD-10-CM | POA: Diagnosis not present

## 2013-09-10 DIAGNOSIS — N2581 Secondary hyperparathyroidism of renal origin: Secondary | ICD-10-CM | POA: Diagnosis not present

## 2013-09-12 ENCOUNTER — Encounter: Payer: Self-pay | Admitting: General Surgery

## 2013-09-12 ENCOUNTER — Encounter: Payer: Self-pay | Admitting: Cardiology

## 2013-09-12 ENCOUNTER — Ambulatory Visit (INDEPENDENT_AMBULATORY_CARE_PROVIDER_SITE_OTHER): Payer: Medicare Other | Admitting: Cardiology

## 2013-09-12 VITALS — BP 132/74 | HR 70 | Ht 73.0 in | Wt 232.0 lb

## 2013-09-12 DIAGNOSIS — R9431 Abnormal electrocardiogram [ECG] [EKG]: Secondary | ICD-10-CM | POA: Diagnosis not present

## 2013-09-12 DIAGNOSIS — I10 Essential (primary) hypertension: Secondary | ICD-10-CM | POA: Diagnosis not present

## 2013-09-12 DIAGNOSIS — I4891 Unspecified atrial fibrillation: Secondary | ICD-10-CM

## 2013-09-12 NOTE — Patient Instructions (Addendum)
Your physician recommends that you continue on your current medications as directed. Please refer to the Current Medication list given to you today.  Your physician has requested that you have a lexiscan myoview. For further information please visit [email or web address] Please follow instruction sheet, as given.  Your physician wants you to follow-up in: 6 Months with Dr Sherlyn Lick will receive a reminder letter in the mail two months in advance. If you don't receive a letter, please call our office to schedule the follow-up appointment.

## 2013-09-12 NOTE — Progress Notes (Signed)
[address] [place], [place]  [address] Phone: [phone] Fax:  [phone]  Date:  09/12/2013   ID:  Trevor [name], DOB [birth year], MRN [id number]  PCP:  Trevor Maes, MD  Cardiologist:  Trevor Magic, MD     History of Present Illness: Trevor [name] is a 63 y.o. male with a history of HTN, ESRD, afib, systemic anticoagulation and syncope secondary felt secondary to afib with RVR who presents back today for followup. He is on chronic anticoagulation with coumadin.  He recently underwent DCCV to NSR on 12/22.  He then developed a large hematoma on his back and developed fevers and cough and presented to the ER on 12/25.  He was taken off Coumadin due to large hematoma significant anemia and is now just on ASA.   He denies any chest pain, SOB, DOE, palpitations or syncope. He says that he continues to cough and is still episodically having fever up to 100.3.   Wt Readings from Last 3 Encounters:  09/12/13 232 lb (105.235 kg)  09/06/13 228 lb 2.8 oz (103.5 kg)  08/25/13 226 lb (102.513 kg)     Past Medical History  Diagnosis Date  . Anemia   . AVF (arteriovenous fistula)     Left  . Secondary hyperparathyroidism   . Hypovitaminosis D   . Hypertensive urgency     H/o  . CHF (congestive heart failure)   . Exertional shortness of breath     "related to infection in my lungs right now" (05/03/2013)  . History of gout     "before I started doing the dialysis" (05/03/2013)  . ESRD (end stage renal disease) on dialysis     "[facility]; MWF" (05/03/2013)  . Sleep apnea   . GERD (gastroesophageal reflux disease)   . Headache(784.0)   . Syncope     felt secondary to residual anesthesia the day before - 2D echo unremarkable  . Pulmonary embolism     with right DVT secondary to recent surgery  . Dysrhythmia     Hx: aflutter  . Atrial fibrillation     on coumadin  . Prolonged Q-T interval on ECG     Current Outpatient Prescriptions  Medication Sig Dispense  Refill  . amLODipine (NORVASC) 10 MG tablet Take 10 mg by mouth daily.        Marland Kitchen aspirin EC 81 MG tablet Take 1 tablet (81 mg total) by mouth daily.  30 tablet  0  . Hydrocodone-Acetaminophen 10-300 MG TABS Take 10-300 mg by mouth.      . labetalol (NORMODYNE) 200 MG tablet Take 200 mg by mouth 2 (two) times daily.      . sevelamer carbonate (RENVELA) 800 MG tablet Take 3,200 mg by mouth as needed (for kidneys).        No current facility-administered medications for this visit.    Allergies:   No Known Allergies  Social History:  The patient  reports that he quit smoking about 41 years ago. His smoking use included Cigarettes. He has a .125 pack-year smoking history. He has never used smokeless tobacco. He reports that he drinks alcohol. He reports that he does not use illicit drugs.   Family History:  The patient's family history includes Allergies in his father; Deep vein thrombosis in his sister; Diabetes in his paternal grandmother; Hypertension in his father; Kidney disease in his father; Pulmonary embolism in his sister.   ROS:  Please see the history of present  illness.      All other systems reviewed and negative.   PHYSICAL EXAM: VS:  BP 132/74  Pulse 70  Ht 6\' 1"  (1.854 m)  Wt 232 lb (105.235 kg)  BMI 30.62 kg/m2 Well nourished, well developed, in no acute distress HEENT: normal Neck: no JVD Cardiac:  normal S1, S2; RRR; no murmur Lungs:  clear to auscultation bilaterally, no wheezing, rhonchi or rales Abd: soft, nontender, no hepatomegaly Ext: no edema Skin: warm and dry Neuro:  CNs 2-12 intact, no focal abnormalities noted  EKG:  NSR with diffuse ST/T wave abnormality in the inferolateral leads     ASSESSMENT AND PLAN:  1. PAF maintaining NSR.  He is now off Coumadin and only on ASA due to large hematoma on back with anemia.  He does not want to go back on blood thinners. 2. HTN - controlled  - continue amlodipine/Labetatolol 3. Recent DVT with PE off  anticoagulation due to anemia and hematoma 4. Abnormal EKG with diffuse ST/T wave abnormality - he has never had a noninvasive ischemic w/u done so I will get a Lexiscan CL  Followup with me in 6 months  Signed, Trevor Magicraci [name], MD 09/12/2013 11:12 AM

## 2013-09-13 ENCOUNTER — Ambulatory Visit: Payer: Medicare Other | Admitting: Physician Assistant

## 2013-09-13 DIAGNOSIS — N186 End stage renal disease: Secondary | ICD-10-CM | POA: Diagnosis not present

## 2013-09-13 DIAGNOSIS — D631 Anemia in chronic kidney disease: Secondary | ICD-10-CM | POA: Diagnosis not present

## 2013-09-13 DIAGNOSIS — N2581 Secondary hyperparathyroidism of renal origin: Secondary | ICD-10-CM | POA: Diagnosis not present

## 2013-09-13 DIAGNOSIS — N039 Chronic nephritic syndrome with unspecified morphologic changes: Secondary | ICD-10-CM | POA: Diagnosis not present

## 2013-09-15 DIAGNOSIS — D631 Anemia in chronic kidney disease: Secondary | ICD-10-CM | POA: Diagnosis not present

## 2013-09-15 DIAGNOSIS — N2581 Secondary hyperparathyroidism of renal origin: Secondary | ICD-10-CM | POA: Diagnosis not present

## 2013-09-15 DIAGNOSIS — N186 End stage renal disease: Secondary | ICD-10-CM | POA: Diagnosis not present

## 2013-09-17 DIAGNOSIS — D631 Anemia in chronic kidney disease: Secondary | ICD-10-CM | POA: Diagnosis not present

## 2013-09-17 DIAGNOSIS — N039 Chronic nephritic syndrome with unspecified morphologic changes: Secondary | ICD-10-CM | POA: Diagnosis not present

## 2013-09-17 DIAGNOSIS — N2581 Secondary hyperparathyroidism of renal origin: Secondary | ICD-10-CM | POA: Diagnosis not present

## 2013-09-17 DIAGNOSIS — N186 End stage renal disease: Secondary | ICD-10-CM | POA: Diagnosis not present

## 2013-09-20 DIAGNOSIS — N039 Chronic nephritic syndrome with unspecified morphologic changes: Secondary | ICD-10-CM | POA: Diagnosis not present

## 2013-09-20 DIAGNOSIS — N2581 Secondary hyperparathyroidism of renal origin: Secondary | ICD-10-CM | POA: Diagnosis not present

## 2013-09-20 DIAGNOSIS — D631 Anemia in chronic kidney disease: Secondary | ICD-10-CM | POA: Diagnosis not present

## 2013-09-20 DIAGNOSIS — N186 End stage renal disease: Secondary | ICD-10-CM | POA: Diagnosis not present

## 2013-09-22 DIAGNOSIS — D631 Anemia in chronic kidney disease: Secondary | ICD-10-CM | POA: Diagnosis not present

## 2013-09-22 DIAGNOSIS — N186 End stage renal disease: Secondary | ICD-10-CM | POA: Diagnosis not present

## 2013-09-22 DIAGNOSIS — N2581 Secondary hyperparathyroidism of renal origin: Secondary | ICD-10-CM | POA: Diagnosis not present

## 2013-09-24 DIAGNOSIS — N186 End stage renal disease: Secondary | ICD-10-CM | POA: Diagnosis not present

## 2013-09-24 DIAGNOSIS — N2581 Secondary hyperparathyroidism of renal origin: Secondary | ICD-10-CM | POA: Diagnosis not present

## 2013-09-24 DIAGNOSIS — D631 Anemia in chronic kidney disease: Secondary | ICD-10-CM | POA: Diagnosis not present

## 2013-09-26 ENCOUNTER — Ambulatory Visit (HOSPITAL_COMMUNITY): Payer: Medicare Other | Attending: Cardiovascular Disease | Admitting: Radiology

## 2013-09-26 ENCOUNTER — Encounter: Payer: Self-pay | Admitting: Cardiovascular Disease

## 2013-09-26 VITALS — BP 144/100 | HR 86 | Ht 73.0 in | Wt 227.0 lb

## 2013-09-26 DIAGNOSIS — Z87891 Personal history of nicotine dependence: Secondary | ICD-10-CM | POA: Diagnosis not present

## 2013-09-26 DIAGNOSIS — I4891 Unspecified atrial fibrillation: Secondary | ICD-10-CM | POA: Diagnosis not present

## 2013-09-26 DIAGNOSIS — R0989 Other specified symptoms and signs involving the circulatory and respiratory systems: Secondary | ICD-10-CM | POA: Insufficient documentation

## 2013-09-26 DIAGNOSIS — R0609 Other forms of dyspnea: Secondary | ICD-10-CM | POA: Insufficient documentation

## 2013-09-26 DIAGNOSIS — R9431 Abnormal electrocardiogram [ECG] [EKG]: Secondary | ICD-10-CM | POA: Insufficient documentation

## 2013-09-26 DIAGNOSIS — I10 Essential (primary) hypertension: Secondary | ICD-10-CM | POA: Diagnosis not present

## 2013-09-26 DIAGNOSIS — R55 Syncope and collapse: Secondary | ICD-10-CM | POA: Insufficient documentation

## 2013-09-26 DIAGNOSIS — R0602 Shortness of breath: Secondary | ICD-10-CM

## 2013-09-26 MED ORDER — REGADENOSON 0.4 MG/5ML IV SOLN
0.4000 mg | Freq: Once | INTRAVENOUS | Status: AC
  Administered 2013-09-26: 0.4 mg via INTRAVENOUS

## 2013-09-26 MED ORDER — TECHNETIUM TC 99M SESTAMIBI GENERIC - CARDIOLITE
30.0000 | Freq: Once | INTRAVENOUS | Status: AC | PRN
  Administered 2013-09-26: 30 via INTRAVENOUS

## 2013-09-26 MED ORDER — TECHNETIUM TC 99M SESTAMIBI GENERIC - CARDIOLITE
10.0000 | Freq: Once | INTRAVENOUS | Status: AC | PRN
  Administered 2013-09-26: 10 via INTRAVENOUS

## 2013-09-26 NOTE — Progress Notes (Signed)
[facility] S[facility] NUCLEAR MED [address] [place], [place] [address] [phone]    Cardiology Nuclear Med Study  Trevor SchwabKenny A [name] is a 63 y.o. male     MRN : [id number]     DOB: 12/26/1950  Procedure Date: 09/26/2013  Nuclear Med Background Indication for Stress Test:  Evaluation for Ischemia and Abnormal EKG History: No prior known history of CAD, 04-29-13 Echo: EF=50-55%, and AFIB>DCCV to NSR on 08-29-13 Cardiac Risk Factors: History of Smoking and Hypertension  Symptoms:  DOE and Syncope   Nuclear Pre-Procedure Caffeine/Decaff Intake:  None > 12 hrs NPO After: 7:00pm   Lungs:  clear O2 Sat: 98% on room air. IV 0.9% NS with Angio Cath:  22g  IV Site: R Wrist x 1, tolerated well IV Started by:  Trevor HongPatsy Edwards, RN  Chest Size (in):  42 Cup Size: n/a  Height: 6\' 1"  (1.854 m)  Weight:     BMI:  There is no weight on file to calculate BMI. Tech Comments:  No medications today. The patient took Labetalol last night.The patient complained that he was back in AFIB at dialysis on 09-24-13. BP 140/90 HR 88 irreg. O2 Sat 97% on arrival. Discussed with Dr. Tenny [name] with approval [facility]. Trevor HongPatsy Edwards, RN.    Nuclear Med Study 1 or 2 day study: 1 day  Stress Test Type:  Lexiscan  Reading MD: N/A  Order Authorizing Provider:  Armanda Magicraci [name], MD  Resting Radionuclide: Technetium 359m Sestamibi  Resting Radionuclide Dose: 11.0 mCi   Stress Radionuclide:  Technetium 3459m Sestamibi  Stress Radionuclide Dose: 33.0 mCi           Stress Protocol Rest HR: 86 Stress HR: 101  Rest BP: 144/100 Stress BP: 115/79  Exercise Time (min): n/a METS: n/a           Dose of Adenosine (mg):  n/a Dose of Lexiscan: 0.4 mg  Dose of Atropine (mg): n/a Dose of Dobutamine: n/a mcg/kg/min (at max HR)  Stress Test Technologist: Trevor [name], BS-ES  Nuclear Technologist:  Trevor [name], CNMT     Rest Procedure:  Myocardial perfusion imaging was performed at rest 45 minutes following the  intravenous administration of Technetium 8659m Sestamibi. Rest ECG: Atrial Fibrilliation  Stress Procedure:  The patient received IV Lexiscan 0.4 mg over 15-seconds.  Technetium 5359m Sestamibi injected at 30-seconds.  Quantitative spect images were obtained after a 45 minute delay.  During the infusion of Lexiscan, the patient had a sensation to cough.  This resolved in recovery.  Stress ECG: No significant change from baseline ECG  QPS Raw Data Images:  Patient motion noted. Stress Images:  There is decreased uptake in the inferior wall. Rest Images:  There is decreased uptake in the inferior wall. Subtraction (SDS):  There is a fixed defect that is most consistent with a previous infarction. Transient Ischemic Dilatation (Normal <1.22):  **0.97* Lung/Heart Ratio (Normal <0.45):  0.40  Quantitative Gated Spect Images QGS EDV:  n/a ml QGS ESV:  n/a ml  Impression Exercise Capacity:  Lexiscan with no exercise. BP Response:  Normal blood pressure response. Clinical Symptoms:  Coughing ECG Impression:  No significant ST segment change suggestive of ischemia. 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  LV Ejection Fraction: not gated.  LV Wall  Motion:  No gating  Trevor [name]

## 2013-09-27 DIAGNOSIS — N039 Chronic nephritic syndrome with unspecified morphologic changes: Secondary | ICD-10-CM | POA: Diagnosis not present

## 2013-09-27 DIAGNOSIS — N186 End stage renal disease: Secondary | ICD-10-CM | POA: Diagnosis not present

## 2013-09-27 DIAGNOSIS — D6859 Other primary thrombophilia: Secondary | ICD-10-CM | POA: Diagnosis not present

## 2013-09-27 DIAGNOSIS — N2581 Secondary hyperparathyroidism of renal origin: Secondary | ICD-10-CM | POA: Diagnosis not present

## 2013-09-27 DIAGNOSIS — D631 Anemia in chronic kidney disease: Secondary | ICD-10-CM | POA: Diagnosis not present

## 2013-09-27 DIAGNOSIS — D689 Coagulation defect, unspecified: Secondary | ICD-10-CM | POA: Diagnosis not present

## 2013-09-29 DIAGNOSIS — N186 End stage renal disease: Secondary | ICD-10-CM | POA: Diagnosis not present

## 2013-09-29 DIAGNOSIS — D631 Anemia in chronic kidney disease: Secondary | ICD-10-CM | POA: Diagnosis not present

## 2013-09-29 DIAGNOSIS — N2581 Secondary hyperparathyroidism of renal origin: Secondary | ICD-10-CM | POA: Diagnosis not present

## 2013-10-01 DIAGNOSIS — D631 Anemia in chronic kidney disease: Secondary | ICD-10-CM | POA: Diagnosis not present

## 2013-10-01 DIAGNOSIS — N2581 Secondary hyperparathyroidism of renal origin: Secondary | ICD-10-CM | POA: Diagnosis not present

## 2013-10-01 DIAGNOSIS — N186 End stage renal disease: Secondary | ICD-10-CM | POA: Diagnosis not present

## 2013-10-03 ENCOUNTER — Telehealth: Payer: Self-pay | Admitting: Gastroenterology

## 2013-10-03 ENCOUNTER — Ambulatory Visit (INDEPENDENT_AMBULATORY_CARE_PROVIDER_SITE_OTHER): Payer: Medicare Other | Admitting: Cardiology

## 2013-10-03 ENCOUNTER — Telehealth: Payer: Self-pay

## 2013-10-03 ENCOUNTER — Encounter (HOSPITAL_COMMUNITY): Admission: RE | Payer: Self-pay | Source: Ambulatory Visit

## 2013-10-03 ENCOUNTER — Ambulatory Visit (HOSPITAL_COMMUNITY): Admission: RE | Admit: 2013-10-03 | Payer: Medicare Other | Source: Ambulatory Visit | Admitting: Gastroenterology

## 2013-10-03 VITALS — BP 148/94 | HR 74 | Resp 16

## 2013-10-03 DIAGNOSIS — I4891 Unspecified atrial fibrillation: Secondary | ICD-10-CM

## 2013-10-03 SURGERY — EGD (ESOPHAGOGASTRODUODENOSCOPY)
Anesthesia: Moderate Sedation

## 2013-10-03 NOTE — Telephone Encounter (Signed)
Pt states he has been having cardiac issues and needs to cancel the EGD at the hospital today. Pt states he will call us back when he is cleared from his cardiac issues to schedule the EGD. Dr. Arlyce Dice aware.

## 2013-10-03 NOTE — Progress Notes (Signed)
Pt here today for EKG per Dr Armanda Magic.  EKG demonstrated At Fib with a rate of 74.  Pt denies any s/s with this.  He has not taken his medications today as he reports he takes them with food and has not eaten yet.  He is aware his BP is elevated and he needs to take them ASAP.  Dr Mayford Knife has reviewed pt's EKG.  She requests he see his PCP today about the hematoma on his back.  The pt reports there has been no improvement in this from last week.  He also reports that he does not have a PCP and was instructed to be seen at the [facility] for evaluation of this.  He is asking about a procedure he was scheduled to have today with Dr Arlyce Dice (EGD) however I was not able to contact anyone in GI.  I left a message for the nurse to call him about this and he is aware. He states he thinks it was cancelled because no one called him about instruction or a time for the procedure.  He is scheduled to see Dr Mayford Knife back on Wednesday at 1:45 pm per her request.  He states understanding about the above.

## 2013-10-03 NOTE — Patient Instructions (Signed)
Please see PCP for evaluation of the hematoma on your back. Continue current medications as listed. Please return Wednesday 1/28 at 1:45 to see Dr Mayford Knife to discuss further treatment and possible anticoagulation due to At Fib.

## 2013-10-03 NOTE — Telephone Encounter (Signed)
Received call from endo to confirm what procedure pt is to have today. Pt should be scheduled for EGD with possible banding to r/o varices. Spoke with Noreene Larsson in endo to confirm.

## 2013-10-04 DIAGNOSIS — N2581 Secondary hyperparathyroidism of renal origin: Secondary | ICD-10-CM | POA: Diagnosis not present

## 2013-10-04 DIAGNOSIS — N186 End stage renal disease: Secondary | ICD-10-CM | POA: Diagnosis not present

## 2013-10-04 DIAGNOSIS — D631 Anemia in chronic kidney disease: Secondary | ICD-10-CM | POA: Diagnosis not present

## 2013-10-05 NOTE — Telephone Encounter (Signed)
Pt was in the office on 10/03/13 for an EKG at the time pt was instructed by [name] to have the hematoma in his back evaluated by his PCP or urgent care, then come back on Wednesday  10/05/13 to see Dr.[name] to discuss further treatment and possible anticoagulation due to A-fib. Pt came in today for the appointment with [name], pt did not go to see his PCP or urgent care, pt was upset because he states it was not explained to him that he  needed  to go to the urgent care to have the hematoma in back evaluated prior coming for the O/V, "beside  I did not feel like going to the urgent care that day"." I am living and forget about this". Pt's family member with pt said that she will get pt to see his PCP to evaluate the hematoma.

## 2013-10-05 NOTE — Telephone Encounter (Signed)
Per my CMA, Trevor [name], patient was instructed to go to Urgent Care to get hematoma evaluated that has persisted since being off coumadin for several weeks. He was notified that he was back in atrial fibrillation rate controlled but could not be started back on anticoagulation until the hematoma or mass on his back was addressed. Please find out when his appt is with PCP and then schedule an appt with me the following week. Please make sure he is on ASA 81mg  daily for now

## 2013-10-06 DIAGNOSIS — D631 Anemia in chronic kidney disease: Secondary | ICD-10-CM | POA: Diagnosis not present

## 2013-10-06 DIAGNOSIS — I4891 Unspecified atrial fibrillation: Secondary | ICD-10-CM | POA: Diagnosis not present

## 2013-10-06 DIAGNOSIS — N186 End stage renal disease: Secondary | ICD-10-CM | POA: Diagnosis not present

## 2013-10-06 DIAGNOSIS — N2581 Secondary hyperparathyroidism of renal origin: Secondary | ICD-10-CM | POA: Diagnosis not present

## 2013-10-08 DIAGNOSIS — N2581 Secondary hyperparathyroidism of renal origin: Secondary | ICD-10-CM | POA: Diagnosis not present

## 2013-10-08 DIAGNOSIS — N186 End stage renal disease: Secondary | ICD-10-CM | POA: Diagnosis not present

## 2013-10-08 DIAGNOSIS — D631 Anemia in chronic kidney disease: Secondary | ICD-10-CM | POA: Diagnosis not present

## 2013-10-09 HISTORY — PX: REMOVAL OF A DIALYSIS CATHETER: SHX6053

## 2013-10-10 NOTE — Telephone Encounter (Signed)
Pt still has not been over to the Urgent care for his hematoma. I told him we need him to do that very soon and that he makes sure they send [place] the information from the OV and then we can set him up to come back in and see Dr Mayford Knife a week later. He verbalized understanding and said he would.   TO Dr Mayford Knife to make aware

## 2013-10-10 NOTE — Telephone Encounter (Signed)
Please call [facility] to get an appt for him tomorrow and call patient with time

## 2013-10-10 NOTE — Telephone Encounter (Signed)
Please call patient and let him know that without any evaluation of the mass on his back which is a ? Hematoma we cannot treat his atrial fibrillation any further.  He is therefore at increased risk of stroke.  Please have him see Urgent Care tomorrow

## 2013-10-10 NOTE — Telephone Encounter (Signed)
I called over to the urgent care and they stated they do not make any appointments they are strictly walk in only.

## 2013-10-11 DIAGNOSIS — N2581 Secondary hyperparathyroidism of renal origin: Secondary | ICD-10-CM | POA: Diagnosis not present

## 2013-10-11 DIAGNOSIS — D509 Iron deficiency anemia, unspecified: Secondary | ICD-10-CM | POA: Diagnosis not present

## 2013-10-11 DIAGNOSIS — N186 End stage renal disease: Secondary | ICD-10-CM | POA: Diagnosis not present

## 2013-10-11 DIAGNOSIS — D631 Anemia in chronic kidney disease: Secondary | ICD-10-CM | POA: Diagnosis not present

## 2013-10-11 NOTE — Telephone Encounter (Signed)
Called and Mailbox was full. I was unable to LVM  

## 2013-10-12 DIAGNOSIS — N186 End stage renal disease: Secondary | ICD-10-CM | POA: Diagnosis not present

## 2013-10-12 DIAGNOSIS — Z452 Encounter for adjustment and management of vascular access device: Secondary | ICD-10-CM | POA: Diagnosis not present

## 2013-10-12 NOTE — Telephone Encounter (Signed)
Called and Mailbox was full. I was unable to LVM

## 2013-10-17 NOTE — Telephone Encounter (Signed)
Unable to LVM for pt to return call.  

## 2013-10-20 NOTE — Telephone Encounter (Signed)
Dr Mayford Knife I have made numerous attempts to contact this pt and I have not been able to reach him. Any advice?

## 2013-10-20 NOTE — Telephone Encounter (Signed)
This patient has a history of atrial fibrillation and underwent DCCV a few months ago.  He was then noted on a nuclear stress test to be back in atrial fibrillation.  After contacting patient he had taken himself off of anticoagulation shortly after DCCV without letting me know because he had a mass on his back that he was told was a hematoma.  I have disussed the need with him to have this evaluated with his PCP for further verification as to whether this is a hematoma or some other type of mass.  I also notified him that it was not safe to restart anticoagulation for afib until this was addressed further. He says he does not have a PCP so we urged him on several occasions to go to the [facility]gent care and gave him the address and phone number.  On  Numerous occasions he said he would go but has yet to have this evaluated and now will not answer is phone calls.  Please send a certified letter to his home to stating that he needs to have the mass on his back evaluated ASAP and call us with the MD who evaluated him so we can determined if it is safe to restart anticoagulation.  Please let him know that if we have not heard from him in the next 30 days that he will be dismissed from our practice due to failure to comply with medical advice.

## 2013-11-01 DIAGNOSIS — D6869 Other thrombophilia: Secondary | ICD-10-CM | POA: Diagnosis not present

## 2013-11-01 DIAGNOSIS — R791 Abnormal coagulation profile: Secondary | ICD-10-CM | POA: Diagnosis not present

## 2013-11-01 DIAGNOSIS — D6859 Other primary thrombophilia: Secondary | ICD-10-CM | POA: Diagnosis not present

## 2013-11-05 DIAGNOSIS — N186 End stage renal disease: Secondary | ICD-10-CM | POA: Diagnosis not present

## 2013-11-08 DIAGNOSIS — D509 Iron deficiency anemia, unspecified: Secondary | ICD-10-CM | POA: Diagnosis not present

## 2013-11-08 DIAGNOSIS — N186 End stage renal disease: Secondary | ICD-10-CM | POA: Diagnosis not present

## 2013-11-08 DIAGNOSIS — N2581 Secondary hyperparathyroidism of renal origin: Secondary | ICD-10-CM | POA: Diagnosis not present

## 2013-11-17 ENCOUNTER — Other Ambulatory Visit: Payer: Self-pay | Admitting: *Deleted

## 2013-11-17 DIAGNOSIS — T82598A Other mechanical complication of other cardiac and vascular devices and implants, initial encounter: Secondary | ICD-10-CM

## 2013-11-21 ENCOUNTER — Emergency Department (INDEPENDENT_AMBULATORY_CARE_PROVIDER_SITE_OTHER)
Admission: EM | Admit: 2013-11-21 | Discharge: 2013-11-21 | Disposition: A | Discharge status: Home / Self Care | Payer: Medicare Other | Source: Home / Self Care | Attending: Emergency Medicine | Admitting: Emergency Medicine

## 2013-11-21 ENCOUNTER — Encounter (HOSPITAL_COMMUNITY): Payer: Self-pay | Admitting: Emergency Medicine

## 2013-11-21 DIAGNOSIS — T148XXA Other injury of unspecified body region, initial encounter: Secondary | ICD-10-CM | POA: Diagnosis not present

## 2013-11-21 MED ORDER — OMEPRAZOLE 20 MG PO CPDR: 20.0000 mg | DELAYED_RELEASE_CAPSULE | Freq: Two times a day (BID) | ORAL | Status: DC

## 2013-11-21 NOTE — Discharge Instructions (Signed)
Hematoma A hematoma is a collection of blood under the skin, in an organ, in a body space, in a joint space, or in other tissue. The blood can clot to form a lump that you can see and feel. The lump is often firm and may sometimes become sore and tender. Most hematomas get better in a few days to weeks. However, some hematomas may be serious and require medical care. Hematomas can range in size from very small to very large. CAUSES  A hematoma can be caused by a blunt or penetrating injury. It can also be caused by spontaneous leakage from a blood vessel under the skin. Spontaneous leakage from a blood vessel is more likely to occur in older people, especially those taking blood thinners. Sometimes, a hematoma can develop after certain medical procedures. SIGNS AND SYMPTOMS   A firm lump on the body.  Possible pain and tenderness in the area.  Bruising.Blue, dark blue, purple-red, or yellowish skin may appear at the site of the hematoma if the hematoma is close to the surface of the skin. For hematomas in deeper tissues or body spaces, the signs and symptoms may be subtle. For example, an intra-abdominal hematoma may cause abdominal pain, weakness, fainting, and shortness of breath. An intracranial hematoma may cause a headache or symptoms such as weakness, trouble speaking, or a change in consciousness. DIAGNOSIS  A hematoma can usually be diagnosed based on your medical history and a physical exam. Imaging tests may be needed if your health care provider suspects a hematoma in deeper tissues or body spaces, such as the abdomen, head, or chest. These tests may include ultrasonography or a CT scan.  TREATMENT  Hematomas usually go away on their own over time. Rarely does the blood need to be drained out of the body. Large hematomas or those that may affect vital organs will sometimes need surgical drainage or monitoring. HOME CARE INSTRUCTIONS   Apply ice to the injured area:   Put ice in a  plastic bag.   Place a towel between your skin and the bag.   Leave the ice on for 20 minutes, 2 3 times a day for the first 1 to 2 days.   After the first 2 days, switch to using warm compresses on the hematoma.   Elevate the injured area to help decrease pain and swelling. Wrapping the area with an elastic bandage may also be helpful. Compression helps to reduce swelling and promotes shrinking of the hematoma. Make sure the bandage is not wrapped too tight.   If your hematoma is on a lower extremity and is painful, crutches may be helpful for a couple days.   Only take over-the-counter or prescription medicines as directed by your health care provider. SEEK IMMEDIATE MEDICAL CARE IF:   You have increasing pain, or your pain is not controlled with medicine.   You have a fever.   You have worsening swelling or discoloration.   Your skin over the hematoma breaks or starts bleeding.   Your hematoma is in your chest or abdomen and you have weakness, shortness of breath, or a change in consciousness.  Your hematoma is on your scalp (caused by a fall or injury) and you have a worsening headache or a change in alertness or consciousness. MAKE SURE YOU:   Understand these instructions.  Will watch your condition.  Will get help right away if you are not doing well or get worse. Document Released: 04/08/2004 Document Revised: 04/27/2013 Document Reviewed:   02/02/2013 Exit[employer] Patient Information 2014 [employer].  

## 2013-11-21 NOTE — ED Provider Notes (Signed)
Chief Complaint    Chief Complaint  Patient presents with  . Mass    History of Present Illness      Trevor [name] is a 63 year old male who was on hemodialysis. He was found to have atrial fibrillation December 14 and begun on Coumadin. He developed a large hematoma on his upper back. The Coumadin was discontinued. Right now is only taking an aspirin a day and. The hematoma is gone away. His cardiologist wants him to be checked prior to cardioversion. He has not had any other bruises or hematomas. He denies any bleeding from his nose, gums, urine, or stool. He is on dialysis Tuesdays, Thursdays, and Saturdays, the care of Dr. Briant CedarMattingly. Dr. Carolanne Grumblingracy [name] as his cardiologist. He denies any shortness of breath, chest pain, or syncope. He is unaware of any heart palpitations.  Review of Systems   Other than as noted above, the patient denies any of the following symptoms: Systemic:  No fever, chills, or myalgias. ENT:  No nasal congestion, rhinorrhea, sore throat, swelling of lips, tongue or throat. Resp:  No cough, wheezing, or shortness of breath.  PMFSH    Past medical history, family history, social history, meds, and allergies were reviewed. His only medications are a blood pressure pill and a phosphorus binder.  Physical Exam     Vital signs:  BP 154/108  Pulse 84  Temp(Src) 98.4 F (36.9 C) (Oral)  Resp 20  SpO2 100% Gen:  Alert, oriented, in no distress. ENT:  Pharynx clear, no intraoral lesions, moist mucous membranes. Lungs:  Clear to auscultation. Heart: Irregular rhythm, no gallop or murmur. Abdomen: Soft, nontender, no organomegaly or mass. Skin:  His skin was clear. Particularly his back showed no hematoma. This has resolved completely. There is a 2 x 3 cm oval spot just below the tip of the shoulder blade which feels somewhat soft and is raised. This could be a lipoma or the remains of the hematoma.  EKG Results    Date: 11/21/2013  Rate: 82  Rhythm: atrial  fibrillation  QRS Axis: normal  Intervals: normal  ST/T Wave abnormalities: nonspecific ST changes and nonspecific T wave changes  Conduction Disutrbances:none  Narrative Interpretation: Atrial fibrillation, right axis deviation, nonspecific ST and T wave abnormalities, prolonged QT.  Old EKG Reviewed: none available  Assessment    The encounter diagnosis was Hematoma.  The hematoma had resolved. This occurred in the context of Coumadin anticoagulation. I think he is okay for cardioversion. I also think he may go back on the Coumadin if necessary.  Plan     1.  Meds:  The following meds were prescribed:   Discharge Medication List as of 11/21/2013 11:42 AM    START taking these medications   Details  omeprazole (PRILOSEC) 20 MG capsule Take 1 capsule (20 mg total) by mouth 2 (two) times daily before a meal., Starting 11/21/2013, Until Discontinued, Normal        2.  Patient Education/Counseling:  The patient was given appropriate handouts, self care instructions, and instructed in symptomatic relief.    3.  Follow up:  The patient was told to follow up here if no better in 3 to 4 days, or sooner if becoming worse in any way, and given some red flag symptoms such as worsening rash, fever, or difficulty breathing which would prompt immediate return.  Follow up with Dr. Carolanne Grumblingracy [name]. A letter was faxed to her stating the above.      Dineen Kidavid C  Lorenz Coaster, MD 11/21/13 [phone]

## 2013-11-21 NOTE — ED Notes (Signed)
Pt     Is  A  Dialysis  Pt    Shunt l  Arm        Has   A history   Of      A  Fib       And  Had  A  Large  Hematoma  On  His  Back  From a  Procedure        -  He  Needs  His  Back   evaulated      Prior  To  Following  Up  With a  Cardiologist      To make  Sure it  Is  OK      He  Is  In no  Distress           At  This  Time

## 2013-11-22 ENCOUNTER — Telehealth: Payer: Self-pay | Admitting: Cardiology

## 2013-11-22 NOTE — Telephone Encounter (Signed)
New message     Patient calling asking has paperwork been fax over from his pcp.

## 2013-11-22 NOTE — Telephone Encounter (Signed)
We did receive the paper work from PCP. Will call back tomorrow to schedule pt to see Dr Mayford Knife about Cardioversion  Per TT. DO not schedule within the next week.

## 2013-11-23 ENCOUNTER — Encounter: Payer: Self-pay | Admitting: Vascular Surgery

## 2013-11-23 NOTE — Telephone Encounter (Signed)
To Dr Mayford Knife is that appt ok or does it need to be sooner? You are completely booked before then

## 2013-11-23 NOTE — Telephone Encounter (Signed)
He was scheduled by Trevor [name] for first week in April. I told pt to keep that appointment.

## 2013-11-24 ENCOUNTER — Ambulatory Visit (HOSPITAL_COMMUNITY)
Admission: RE | Admit: 2013-11-24 | Discharge: 2013-11-24 | Disposition: A | Discharge status: Home / Self Care | Payer: Medicare Other | Source: Ambulatory Visit | Attending: Vascular Surgery | Admitting: Vascular Surgery

## 2013-11-24 ENCOUNTER — Ambulatory Visit (INDEPENDENT_AMBULATORY_CARE_PROVIDER_SITE_OTHER): Payer: Medicare Other | Admitting: Vascular Surgery

## 2013-11-24 ENCOUNTER — Encounter: Payer: Self-pay | Admitting: Vascular Surgery

## 2013-11-24 VITALS — BP 127/110 | HR 77 | Ht 73.0 in | Wt 229.0 lb

## 2013-11-24 DIAGNOSIS — T82598A Other mechanical complication of other cardiac and vascular devices and implants, initial encounter: Secondary | ICD-10-CM | POA: Insufficient documentation

## 2013-11-24 DIAGNOSIS — N186 End stage renal disease: Secondary | ICD-10-CM | POA: Diagnosis not present

## 2013-11-24 DIAGNOSIS — Y841 Kidney dialysis as the cause of abnormal reaction of the patient, or of later complication, without mention of misadventure at the time of the procedure: Secondary | ICD-10-CM | POA: Insufficient documentation

## 2013-11-24 NOTE — Telephone Encounter (Signed)
Patient needs to come back in to discuss restarting anticoagulation.  Anytime in the next 2 weeks is fine.  April 1st appt is fine

## 2013-11-24 NOTE — Progress Notes (Signed)
Patient is a 63 year old male who underwent left basilic vein transposition fistula on 06/27/2013. He returns today for followup. He denies any numbness or tingling in his hand. He was sent to the office for difficulties with cannulation. However the patient states over the last couple of dialysis sessions they have had no difficulty cannulating his fistula.  Review of systems: He denies shortness of breath. He denies chest pain. He still has a persistent chronic cough.  Physical exam:  Filed Vitals:   11/24/13 1510  BP: 127/110  Pulse: 77  Height: 6\' 1"  (1.854 m)  Weight: 229 lb (103.874 kg)  SpO2: 98%    Left upper extremity: Palpable thrill in fistula fistula is well-developed and easily visible on the skin surface  Data: Duplex ultrasound of the fistula was performed today. The fistula is 6-9 mm in diameter throughout its course. There were some increased velocities at the proximal aspect of the fistula and these have increased somewhat since his last duplex. The fistula is less than 6 mm in depth from the skin throughout its course.  Assessment: Functioning left upper arm fistula. The patient states that they have improved the cannulation technique. He may have some narrowing of the proximal vein. I discussed with the patient possibility of fistulogram her angioplasty of this. However currently he wishes to continue dialysis as is. If he has any problems with cannulation or decreased access flow a repeat fistulogram in the near future. Otherwise he will followup on as-needed basis.  Plan: See above  Fabienne Bruns, MD [facility] Office: [phone] Pager: [phone]

## 2013-11-25 NOTE — Telephone Encounter (Signed)
Noted pt aware to keep appt.

## 2013-11-29 ENCOUNTER — Telehealth: Payer: Self-pay | Admitting: *Deleted

## 2013-11-29 NOTE — Telephone Encounter (Signed)
//  Called dialysis center and notified nursing department that Dr. Truett Perna has never seen this patient . Asking if this was a referral? Patient told them he has seen Dr. Truett Perna before. Noted that Dr. Truett Perna saw him in hospital on 09/05/13 and signed off case. Send in an outpatient referral if needed after discharge for any physician in practice to see him. Printed this note and faxed to dialysis center.  Labs sent to be scanned.

## 2013-12-06 ENCOUNTER — Ambulatory Visit (INDEPENDENT_AMBULATORY_CARE_PROVIDER_SITE_OTHER): Payer: Medicare Other | Admitting: Surgery

## 2013-12-06 DIAGNOSIS — N186 End stage renal disease: Secondary | ICD-10-CM | POA: Diagnosis not present

## 2013-12-08 ENCOUNTER — Telehealth (INDEPENDENT_AMBULATORY_CARE_PROVIDER_SITE_OTHER): Payer: Self-pay

## 2013-12-08 DIAGNOSIS — D509 Iron deficiency anemia, unspecified: Secondary | ICD-10-CM | POA: Diagnosis not present

## 2013-12-08 DIAGNOSIS — N2581 Secondary hyperparathyroidism of renal origin: Secondary | ICD-10-CM | POA: Diagnosis not present

## 2013-12-08 DIAGNOSIS — N186 End stage renal disease: Secondary | ICD-10-CM | POA: Diagnosis not present

## 2013-12-08 DIAGNOSIS — N039 Chronic nephritic syndrome with unspecified morphologic changes: Secondary | ICD-10-CM | POA: Diagnosis not present

## 2013-12-08 DIAGNOSIS — D631 Anemia in chronic kidney disease: Secondary | ICD-10-CM | POA: Diagnosis not present

## 2013-12-08 NOTE — Telephone Encounter (Signed)
Called and spoke to medical records requesting office notes for patient upcoming appointment on Monday 12/12/13 @ 11:00am w/Dr. Derrell Lolling.  Records to be faxed to [phone] attn: Neysa Bonito

## 2013-12-12 ENCOUNTER — Other Ambulatory Visit (INDEPENDENT_AMBULATORY_CARE_PROVIDER_SITE_OTHER): Payer: Self-pay | Admitting: General Surgery

## 2013-12-12 ENCOUNTER — Encounter (INDEPENDENT_AMBULATORY_CARE_PROVIDER_SITE_OTHER): Payer: Self-pay | Admitting: General Surgery

## 2013-12-12 ENCOUNTER — Encounter (INDEPENDENT_AMBULATORY_CARE_PROVIDER_SITE_OTHER): Payer: Self-pay

## 2013-12-12 ENCOUNTER — Ambulatory Visit (INDEPENDENT_AMBULATORY_CARE_PROVIDER_SITE_OTHER): Payer: Medicare Other | Admitting: General Surgery

## 2013-12-12 VITALS — BP 122/76 | Temp 97.9°F | Resp 18 | Ht 73.5 in | Wt 231.8 lb

## 2013-12-12 DIAGNOSIS — K429 Umbilical hernia without obstruction or gangrene: Secondary | ICD-10-CM

## 2013-12-12 NOTE — Progress Notes (Signed)
Patient ID: Trevor [name], male   DOB: 1951-02-28, 63 y.o.   MRN: [id number]  No chief complaint on file.   HPI Trevor [name] is a 63 y.o. male.  The patient is a 63 year old male who is referred by [name] for evaluation of umbilical hernia. This states is been there for several months. The patient does state that he has pain in his periumbilical area. He states it is reducible.  The patient has had a coughing spell the last several months. He states it is been worked up and has not been diagnosed with a specific diagnosis. This has been associated to reflux. The patient also sees [name] his cardiologist for A. Fib and hypertension.  The patient undergoes hemodialysis Tuesday Thursday Saturday.  HPI  Past Medical History  Diagnosis Date  . Anemia   . AVF (arteriovenous fistula)     Left  . Secondary hyperparathyroidism   . Hypovitaminosis D   . Hypertensive urgency     H/o  . CHF (congestive heart failure)   . Exertional shortness of breath     "related to infection in my lungs right now" (05/03/2013)  . History of gout     "before I started doing the dialysis" (05/03/2013)  . ESRD (end stage renal disease) on dialysis     "[facility]; MWF" (05/03/2013)  . Sleep apnea   . GERD (gastroesophageal reflux disease)   . Headache(784.0)   . Syncope     felt secondary to residual anesthesia the day before - 2D echo unremarkable  . Pulmonary embolism     with right DVT secondary to recent surgery  . Atrial fibrillation     not on coumadin due to large spontaneous hematoma on back and anemia    Past Surgical History  Procedure Laterality Date  . Av fistula placement Left     Dr. Charlean Sanfilippo; "I've had 2 on the left' (05/03/2013)  . Av fistula placement Right ~ 2011  . Knee arthroscopy Left   . Avgg removal Right 05/04/2013    Procedure: REMOVAL OF ARTERIOVENOUS Fistula Right Arm;  Surgeon: [name], MD;  Location: [facility] OR;  Service: Vascular;  Laterality: Right;  .  Insertion of dialysis catheter Right 05/04/2013    Procedure: INSERTION OF DIALYSIS CATHETER;  Surgeon: [name], MD;  Location: [facility] OR;  Service: Vascular;  Laterality: Right;  . Colonoscopy      Hx: of  . Bascilic vein transposition Left 06/27/2013    Procedure: BASCILIC VEIN TRANSPOSITION;  Surgeon: [name], MD;  Location: [facility] OR;  Service: Vascular;  Laterality: Left;  . Cardioversion N/A 08/29/2013    Procedure: CARDIOVERSION;  Surgeon: [name], MD;  Location: [facility] ENDOSCOPY;  Service: Cardiovascular;  Laterality: N/A;    Family History  Problem Relation Age of Onset  . Hypertension Father   . Kidney disease Father   . Allergies Father   . Deep vein thrombosis Sister   . Pulmonary embolism Sister   . Diabetes Paternal Grandmother     Social History History  Substance Use Topics  . Smoking status: Former Smoker -- 0.25 packs/day for .5 years    Types: Cigarettes    Quit date: 09/08/1972  . Smokeless tobacco: Never Used  . Alcohol Use: Yes     Comment: 05/03/2013 "haven't had a glass of wine in ~ 3 months or so; sometimes will have one w/dinner"    No Known Allergies  Current Outpatient Prescriptions  Medication Sig Dispense Refill  . amLODipine (NORVASC) 10 MG tablet Take 10 mg by mouth daily.        Trevor [name]. labetalol (NORMODYNE) 200 MG tablet Take 200 mg by mouth 2 (two) times daily.      Trevor [name]. lidocaine-prilocaine (EMLA) cream Apply 1 application topically as needed.      . Nutritional Supplements (ARGINAID EXTRA) LIQD Take 2,000 mg by mouth.      . sildenafil (VIAGRA) 50 MG tablet Take 50 mg by mouth daily as needed for erectile dysfunction.      Trevor [name]. aspirin EC 81 MG tablet Take 1 tablet (81 mg total) by mouth daily.  30 tablet  0  . Hydrocodone-Acetaminophen 10-300 MG TABS Take 10-300 mg by mouth.      Trevor [name]. omeprazole (PRILOSEC) 20 MG capsule Take 1 capsule (20 mg total) by mouth 2 (two) times daily before a meal.  60 capsule  12  . sevelamer carbonate (RENVELA)  800 MG tablet Take 3,200 mg by mouth as needed (for kidneys).        No current facility-administered medications for this visit.    Review of Systems Review of Systems  Constitutional: Negative.   HENT: Negative.   Eyes: Negative.   Respiratory: Negative.   Cardiovascular: Negative.   Gastrointestinal: Negative.   Endocrine: Negative.   Neurological: Negative.     Blood pressure 122/76, temperature 97.9 F (36.6 C), temperature source Temporal, resp. rate 18, height 6' 1.5" (1.867 m), weight 231 lb 12.8 oz (105.144 kg).  Physical Exam Physical Exam  Constitutional: He is oriented to person, place, and time. He appears well-developed and well-nourished.  HENT:  Head: Normocephalic and atraumatic.  Eyes: Conjunctivae and EOM are normal. Pupils are equal, round, and reactive to light.  Neck: Normal range of motion. Neck supple.  Cardiovascular: Normal rate, regular rhythm and normal heart sounds.   Pulmonary/Chest: Effort normal and breath sounds normal.  Abdominal: Soft. Bowel sounds are normal. He exhibits no distension and no mass. There is no tenderness. There is no rebound and no guarding. A hernia is present. Hernia confirmed positive in the ventral area.    Musculoskeletal: Normal range of motion.  Neurological: He is alert and oriented to person, place, and time.  Skin: Skin is warm and dry.    Data Reviewed none  Assessment    63 year old male with a small umbilical hernia     Plan    1. We'll get cardiac clearance from Dr. Mayford Knifeurner. 2. We will have the patient scheduled for a laparoscopic umbilical hernia repair withMesh. The patient will need to be admitted for 23 hour observation. On his last vascular surgery the patient needed to be brought back to the hospital after being discharged from same-day surgery secondary to syncope. 3. All risks and benefits were discussed with the patient, to generally include infection, bleeding, damage to surrounding structures,  acute and chronic nerve pain, and recurrence. Alternatives were offered and described.  All questions were answered and the patient voiced understanding of the procedure and wishes to proceed at this point. 4. We will also consult Dr. Briant CedarMattingly when the patient is admitted for observation to assist with dialysis orders.        Marigene Ehlersamirez Jr., Meta Kroenke 12/12/2013, 11:05 AM

## 2013-12-13 ENCOUNTER — Telehealth (INDEPENDENT_AMBULATORY_CARE_PROVIDER_SITE_OTHER): Payer: Self-pay

## 2013-12-13 ENCOUNTER — Telehealth: Payer: Self-pay | Admitting: Cardiology

## 2013-12-13 NOTE — Telephone Encounter (Signed)
He is scheduled on 4/9 to see you

## 2013-12-13 NOTE — Telephone Encounter (Signed)
This patient has never followed up after being seen by PCP to talk about restarting coumadin.  Please get him an OV with me

## 2013-12-13 NOTE — Telephone Encounter (Signed)
Message copied by Maryan Puls on Tue Dec 13, 2013 10:47 AM ------      Message from: Axel Filler      Created: Tue Dec 13, 2013 10:38 AM                   ----- Message -----         From: [name], MD         Sent: 12/13/2013   7:45 AM           To: Axel Filler, MD            The patient is low risk from a cardiac standpoint for surgical repair of umbilical hernia.  He has atrial fibrillation and recently had a spontaneous hematoma on Coumadin and stopped anticoagulants and has refused to go back on them.  He needs to followup with me post op to get placed back on Coumadin.      ----- Message -----         From: Axel Filler, MD         Sent: 12/12/2013  11:10 AM           To: [name], MD                   ------

## 2013-12-15 ENCOUNTER — Ambulatory Visit (INDEPENDENT_AMBULATORY_CARE_PROVIDER_SITE_OTHER): Payer: Medicare Other | Admitting: Cardiology

## 2013-12-15 ENCOUNTER — Encounter: Payer: Self-pay | Admitting: Cardiology

## 2013-12-15 VITALS — BP 106/80 | HR 72 | Ht 73.0 in | Wt 227.0 lb

## 2013-12-15 DIAGNOSIS — I4891 Unspecified atrial fibrillation: Secondary | ICD-10-CM

## 2013-12-15 DIAGNOSIS — R9431 Abnormal electrocardiogram [ECG] [EKG]: Secondary | ICD-10-CM | POA: Diagnosis not present

## 2013-12-15 DIAGNOSIS — I10 Essential (primary) hypertension: Secondary | ICD-10-CM

## 2013-12-15 DIAGNOSIS — I509 Heart failure, unspecified: Secondary | ICD-10-CM

## 2013-12-15 NOTE — Patient Instructions (Signed)
Your physician recommends that you continue on your current medications as directed. Please refer to the Current Medication list given to you today.  Your physician has requested that you have an echocardiogram. Echocardiography is a painless test that uses sound waves to create images of your heart. It provides your doctor with information about the size and shape of your heart and how well your heart's chambers and valves are working. This procedure takes approximately one hour. There are no restrictions for this procedure.  Your physician recommends that you schedule a follow-up appointment in: 2 months with Dr Mayford Knife

## 2013-12-15 NOTE — Progress Notes (Signed)
[address] [place], [place]  [address] Phone: [phone] Fax:  [phone]  Date:  12/15/2013   ID:  [name], DOB May 05, 1951, MRN [id number]  PCP:  Dyke Maes, MD  Cardiologist:  Armanda Magic, MD     History of Present Illness:Trevor [name] is a 63 y.o. male with a history of HTN, ESRD, afib, systemic anticoagulation and syncope secondary felt secondary to afib with RVR who presents back today for followup. He is on chronic anticoagulation with coumadin. He recently underwent DCCV to NSR on 12/22. He then developed a large hematoma on his back and developed fevers and cough and presented to the ER on 12/25. He was taken off Coumadin due to large hematoma significant anemia and is now just on ASA.  He was seen recently by Urgent Care to determine whether it was safe to restart Coumadin.  His hematoma had completely resolved and his PCP says he was ok to restart coumadin.   He denies any chest pain,  palpitations or syncope. Intermittently he will have some SOB which is related to when he coughs.  He has an umbilical hernia which he needs to have repaired and needs preoperative clearance.    Wt Readings from Last 3 Encounters:  12/15/13 227 lb (102.967 kg)  12/12/13 231 lb 12.8 oz (105.144 kg)  11/24/13 229 lb (103.874 kg)     Past Medical History  Diagnosis Date  . Anemia   . AVF (arteriovenous fistula)     Left  . Secondary hyperparathyroidism   . Hypovitaminosis D   . Hypertensive urgency     H/o  . CHF (congestive heart failure)   . Exertional shortness of breath     "related to infection in my lungs right now" (05/03/2013)  . History of gout     "before I started doing the dialysis" (05/03/2013)  . ESRD (end stage renal disease) on dialysis     "[facility]; MWF" (05/03/2013)  . Sleep apnea   . GERD (gastroesophageal reflux disease)   . Headache(784.0)   . Syncope     felt secondary to residual anesthesia the day before - 2D echo unremarkable   . Pulmonary embolism     with right DVT secondary to recent surgery  . Atrial fibrillation     not on coumadin due to large spontaneous hematoma on back and anemia    Current Outpatient Prescriptions  Medication Sig Dispense Refill  . amLODipine (NORVASC) 10 MG tablet Take 10 mg by mouth daily.        Marland Kitchen aspirin EC 81 MG tablet Take 1 tablet (81 mg total) by mouth daily.  30 tablet  0  . Hydrocodone-Acetaminophen 10-300 MG TABS Take 10-300 mg by mouth.      . labetalol (NORMODYNE) 200 MG tablet Take 200 mg by mouth 2 (two) times daily.      Marland Kitchen lidocaine-prilocaine (EMLA) cream Apply 1 application topically as needed.      . Nutritional Supplements (ARGINAID EXTRA) LIQD Take 2,000 mg by mouth.      Marland Kitchen omeprazole (PRILOSEC) 20 MG capsule Take 1 capsule (20 mg total) by mouth 2 (two) times daily before a meal.  60 capsule  12  . sevelamer carbonate (RENVELA) 800 MG tablet Take 3,200 mg by mouth as needed (for kidneys).       . sildenafil (VIAGRA) 50 MG tablet Take 50 mg by mouth daily as needed for erectile dysfunction.  No current facility-administered medications for this visit.    Allergies:   No Known Allergies  Social History:  The patient  reports that he quit smoking about 41 years ago. His smoking use included Cigarettes. He has a .125 pack-year smoking history. He has never used smokeless tobacco. He reports that he drinks alcohol. He reports that he does not use illicit drugs.   Family History:  The patient's family history includes Allergies in his father; Deep vein thrombosis in his sister; Diabetes in his paternal grandmother; Hypertension in his father; Kidney disease in his father; Pulmonary embolism in his sister.   ROS:  Please see the history of present illness.      All other systems reviewed and negative.   PHYSICAL EXAM: VS:  BP 106/80  Pulse 72  Ht 6\' 1"  (1.854 m)  Wt 227 lb (102.967 kg)  BMI 29.96 kg/m2  SpO2 97% Well nourished, well developed, in no acute  distress HEENT: normal Neck: no JVD Cardiac:  normal S1, S2; RRR; no murmur Lungs:  clear to auscultation bilaterally, no wheezing, rhonchi or rales Abd: soft, nontender, no hepatomegaly Ext: no edema Skin: warm and dry Neuro:  CNs 2-12 intact, no focal abnormalities noted  EKG:     Atrial fibrillation/flutter with CVR, ST/T wave abnormality consider anterolateral ischemia - no change from prior EKG  ASSESSMENT AND PLAN:  1.  Chronic atrial fibrillation rate controlled - continue labetalol - we discussed going back on Coumadin and at this point he is adamant that he does not want to be on anticoagulation.  He understands the increased risk of CVA off anticoagulation and accepts the risk.  He want to get his hernia fixed first and then reconsider.   2.  HTN - controlled - continue amlodipine/Labetatolol  3.  Recent DVT with PE off anticoagulation due to anemia and hematoma 5.  Abnormal EKG with diffuse ST/T wave abnormality - he had a recent low risk nuclear stress test with a fixed inferior defect with 2D echo done 04/2013 showing normal LVF with no RWMA's.  I will repeat an echo to make sure LVF is still normal and no RWMA's and if normal then he is cleared from cardiac standpoint for umbilical hernia repair  Followup with me in 2 months   Signed, Armanda Magicraci Ana Liaw, MD 12/15/2013 2:45 PM

## 2013-12-19 ENCOUNTER — Ambulatory Visit (HOSPITAL_COMMUNITY)
Admission: RE | Admit: 2013-12-19 | Discharge: 2013-12-19 | Disposition: A | Discharge status: Home / Self Care | Payer: Medicare Other | Source: Ambulatory Visit | Attending: Cardiology | Admitting: Cardiology

## 2013-12-19 DIAGNOSIS — I369 Nonrheumatic tricuspid valve disorder, unspecified: Secondary | ICD-10-CM

## 2013-12-19 DIAGNOSIS — I10 Essential (primary) hypertension: Secondary | ICD-10-CM | POA: Insufficient documentation

## 2013-12-19 DIAGNOSIS — Z01810 Encounter for preprocedural cardiovascular examination: Secondary | ICD-10-CM | POA: Insufficient documentation

## 2013-12-19 DIAGNOSIS — I4891 Unspecified atrial fibrillation: Secondary | ICD-10-CM | POA: Insufficient documentation

## 2013-12-19 DIAGNOSIS — R9431 Abnormal electrocardiogram [ECG] [EKG]: Secondary | ICD-10-CM

## 2013-12-19 DIAGNOSIS — I509 Heart failure, unspecified: Secondary | ICD-10-CM | POA: Diagnosis not present

## 2013-12-19 NOTE — Progress Notes (Signed)
2D Echo Performed 12/19/2013    Chamika Cunanan, RCS  

## 2014-01-05 DIAGNOSIS — N186 End stage renal disease: Secondary | ICD-10-CM | POA: Diagnosis not present

## 2014-01-07 DIAGNOSIS — N186 End stage renal disease: Secondary | ICD-10-CM | POA: Diagnosis not present

## 2014-01-07 DIAGNOSIS — D631 Anemia in chronic kidney disease: Secondary | ICD-10-CM | POA: Diagnosis not present

## 2014-01-07 DIAGNOSIS — N2581 Secondary hyperparathyroidism of renal origin: Secondary | ICD-10-CM | POA: Diagnosis not present

## 2014-01-13 ENCOUNTER — Encounter (HOSPITAL_COMMUNITY): Payer: Self-pay

## 2014-01-14 NOTE — Pre-Procedure Instructions (Signed)
[facility] - Preparing for Surgery  Before surgery, you can play an important role.  Because skin is not sterile, your skin needs to be as free of germs as possible.  You can reduce the number of germs on you skin by washing with CHG (chlorahexidine gluconate) soap before surgery.  CHG is an antiseptic cleaner which kills germs and bonds with the skin to continue killing germs even after washing.  Please DO NOT use if you have an allergy to CHG or antibacterial soaps.  If your skin becomes reddened/irritated stop using the CHG and inform your nurse when you arrive at Short Stay.  Do not shave (including legs and underarms) for at least 48 hours prior to the first CHG shower.  You may shave your face.  Please follow these instructions carefully:   1.  Shower with CHG Soap the night before surgery and the morning of Surgery.  2.  If you choose to wash your hair, wash your hair first as usual with your normal shampoo.  3.  After you shampoo, rinse your hair and body thoroughly to remove the shampoo.  4.  Use CHG as you would any other liquid soap.  You can apply CHG directly to the skin and wash gently with scrungie or a clean washcloth.  5.  Apply the CHG Soap to your body ONLY FROM THE NECK DOWN.  Do not use on open wounds or open sores.  Avoid contact with your eyes, ears, mouth and genitals (private parts).  Wash genitals (private parts) with your normal soap.  6.  Wash thoroughly, paying special attention to the area where your surgery will be performed.  7.  Thoroughly rinse your body with warm water from the neck down.  8.  DO NOT shower/wash with your normal soap after using and rinsing off the CHG Soap.  9.  Pat yourself dry with a clean towel.            10.  Wear clean pajamas.            11.  Place clean sheets on your bed the night of your first shower and do not sleep with pets.  Day of Surgery  Do not apply any lotions the morning of surgery.  Please wear clean clothes to the  hospital/surgery center.   

## 2014-01-14 NOTE — Pre-Procedure Instructions (Addendum)
Trevor [name]  01/14/2014   Your procedure is scheduled on:  May 18  Report to [facility] Admitting at 05:30 AM.  Call this number if you have problems the morning of surgery: [phone]   Remember:   Do not eat food or drink liquids after midnight.   Take these medicines the morning of surgery with A SIP OF WATER: Amlodipine, Labetalol,          Take all meds as ordered until day of surgery except as instructed below or per dr   Despina Arias L- Arginine, Vitamin C today   STOP/ Do not take Aspirin, Aleve, Naproxen, Advil, Ibuprofen, Vitamin, Herbs, or Supplements starting today   Do not wear jewelry, make-up or nail polish.  Do not wear lotions, powders, or perfumes. You may wear deodorant.  Do not shave 48 hours prior to surgery. Men may shave face and neck.  Do not bring valuables to the hospital.  [facility] is not responsible for any belongings or valuables.               Contacts, dentures or bridgework may not be worn into surgery.  Leave suitcase in the car. After surgery it may be brought to your room.  For patients admitted to the hospital, discharge time is determined by your treatment team.               Patients discharged the day of surgery will not be allowed to drive home.  Name and phone number of your driver: Family/ Friend  Special Instructions:  Special Instructions: [facility] - Preparing for Surgery  Before surgery, you can play an important role.  Because skin is not sterile, your skin needs to be as free of germs as possible.  You can reduce the number of germs on you skin by washing with CHG (chlorahexidine gluconate) soap before surgery.  CHG is an antiseptic cleaner which kills germs and bonds with the skin to continue killing germs even after washing.  Please DO NOT use if you have an allergy to CHG or antibacterial soaps.  If your skin becomes reddened/irritated stop using the CHG and inform your nurse when you arrive at Short Stay.  Do not  shave (including legs and underarms) for at least 48 hours prior to the first CHG shower.  You may shave your face.  Please follow these instructions carefully:   1.  Shower with CHG Soap the night before surgery and the morning of Surgery.  2.  If you choose to wash your hair, wash your hair first as usual with your normal shampoo.  3.  After you shampoo, rinse your hair and body thoroughly to remove the Shampoo.  4.  Use CHG as you would any other liquid soap.  You can apply chg directly  to the skin and wash gently with scrungie or a clean washcloth.  5.  Apply the CHG Soap to your body ONLY FROM THE NECK DOWN.  Do not use on open wounds or open sores.  Avoid contact with your eyes ears, mouth and genitals (private parts).  Wash genitals (private parts)       with your normal soap.  6.  Wash thoroughly, paying special attention to the area where your surgery will be performed.  7.  Thoroughly rinse your body with warm water from the neck down.  8.  DO NOT shower/wash with your normal soap after using and rinsing off the CHG Soap.  9.  Pat yourself dry with a clean towel.            10.  Wear clean pajamas.            11.  Place clean sheets on your bed the night of your first shower and do not sleep with pets.  Day of Surgery  Do not apply any lotions/deodorants the morning of surgery.  Please wear clean clothes to the hospital/surgery center.   Please read over the following fact sheets that you were given: Pain Booklet, Coughing and Deep Breathing and Surgical Site Infection Prevention

## 2014-01-16 ENCOUNTER — Encounter (HOSPITAL_COMMUNITY): Payer: Self-pay | Admitting: Vascular Surgery

## 2014-01-16 ENCOUNTER — Encounter (HOSPITAL_COMMUNITY): Payer: Self-pay

## 2014-01-16 ENCOUNTER — Encounter (HOSPITAL_COMMUNITY)
Admission: RE | Admit: 2014-01-16 | Discharge: 2014-01-16 | Disposition: A | Discharge status: Home / Self Care | Payer: Medicare Other | Source: Ambulatory Visit | Attending: General Surgery | Admitting: General Surgery

## 2014-01-16 ENCOUNTER — Ambulatory Visit (HOSPITAL_COMMUNITY)
Admission: RE | Admit: 2014-01-16 | Discharge: 2014-01-16 | Disposition: A | Discharge status: Home / Self Care | Payer: Medicare Other | Source: Ambulatory Visit | Attending: Anesthesiology | Admitting: Anesthesiology

## 2014-01-16 DIAGNOSIS — R0989 Other specified symptoms and signs involving the circulatory and respiratory systems: Secondary | ICD-10-CM | POA: Diagnosis not present

## 2014-01-16 DIAGNOSIS — I517 Cardiomegaly: Secondary | ICD-10-CM | POA: Diagnosis not present

## 2014-01-16 DIAGNOSIS — Z01818 Encounter for other preprocedural examination: Secondary | ICD-10-CM | POA: Diagnosis not present

## 2014-01-16 HISTORY — DX: Peripheral vascular disease, unspecified: I73.9

## 2014-01-16 HISTORY — DX: Atherosclerotic heart disease of native coronary artery without angina pectoris: I25.10

## 2014-01-16 HISTORY — DX: Acute myocardial infarction, unspecified: I21.9

## 2014-01-17 ENCOUNTER — Encounter (INDEPENDENT_AMBULATORY_CARE_PROVIDER_SITE_OTHER): Payer: Self-pay

## 2014-01-17 NOTE — Progress Notes (Signed)
Anesthesia Chart Evaluation: Patient is a laparoscopic umbilical hernia repair with mesh on 01/23/14 by Dr. Derrell Lolling.    History includes ESRD s/p LUE basilic vein transposition, syncope felt related to afib with RVR s/p cardioversion 08/29/13 complicated by right thorax hematoma 09/01/13 (later developed recurrent afib), CAD/MI '90's, RLE DVT/possible small PE (versus respiratory motion on CT) 06/28/13, CHF, OSA, former smoker, GERD, HTN, anemia. Nephrologist is [name]. Cardiologist is [name]. She had previously cleared him with low risk per a telephone encounter on 12/12/13 (see 12/13/13 telephone encounter by Maryan Puls, CMA), but later ordered an echo that showed a decrease in his EF and a coronary CTA was ordered which does not appear to have been done yet.  Of note, he is not currently on anticoagulation due to anemia with hematoma following his cardioversion.    EKG on 12/15/13 showed: Atrial fibrillation/flutter with CVR, left posterior fascicular block. ST/T wave abnormality consider anterolateral ischemia - no change from prior EKG   Echo on 12/19/13 showed: - Left ventricle: Wall thickness was increased in a pattern of mild LVH. Systolic function was moderately reduced. The estimated ejection fraction was in the range of 35% to 40%. Diffuse hypokinesis. The study is not technically sufficient to allow evaluation of LV diastolic function. - Ventricular septum: The contour showed diastolic flattening. These changes are consistent with RV volume overload. - Aortic valve: Mild regurgitation. - Mitral valve: Calcified annulus. - Left atrium: The atrium was severely dilated. - Right ventricle: The cavity size was severely dilated. Systolic function was moderately reduced. - Right atrium: The atrium was severely dilated. - Atrial septum: No defect or patent foramen ovale was identified. - Tricuspid valve: Moderate-severe regurgitation directed centrally. - Pulmonary arteries: Systolic  pressure was moderately increased. PA peak pressure: 22mm Hg (S). - Pericardium, extracardiac: A trivial pericardial effusion was identified posterior to the heart. (EF was previously 50-55% on 04/29/13. His 09/2013 nuclear stress test showed a fixed defect in the inferior wall and he has coronary artery calcifications on his chest CT, so [name] recommended a coronary artery CTA to evaluate calcium score and for CAD as he did not have chest pain but needed preoperative clearance.)  Nuclear stress test on 09/27/13 showed: Overall Impression: Low risk stress nuclear study Small inferior wall infarct from apex to base with no ischemia LV appears enlarged No gating due to afib. LV Ejection Fraction: not gated. LV Wall Motion: No gating.   CXR on 01/16/14 showed: Cardiomegaly. Central vascular congestion without pulmonary edema. No segmental infiltrate.  Apparently his PAT RN did not do any labs at his PAT visit--only ISTAT for the day of surgery.  Would typically I would get a CBC and BMET preoperatively, so will enter these orders.  If his assigned anesthesiologist is okay with just an ISTAT then I will let him/her make that call on the day of surgery.   I spoke with Maryan Puls, CMA at Dr. Jacinto Halim office regarding Dr. Roel Cluck recommendations.  She will discuss with Dr. Derrell Lolling and plans to contact Dr. Norris Cross office to clarify the status of his surgical clearance.  Velna Ochs [facility] Short Stay Center/Anesthesiology Phone [phone] 01/17/2014 11:44 AM

## 2014-01-19 NOTE — Progress Notes (Signed)
Christy at Dr Ramirez's office called about card clearance, states that they are going to cancel his surgery.

## 2014-01-20 ENCOUNTER — Telehealth: Payer: Self-pay | Admitting: Cardiology

## 2014-01-20 ENCOUNTER — Telehealth (INDEPENDENT_AMBULATORY_CARE_PROVIDER_SITE_OTHER): Payer: Self-pay

## 2014-01-20 ENCOUNTER — Other Ambulatory Visit: Payer: Self-pay | Admitting: General Surgery

## 2014-01-20 DIAGNOSIS — R9431 Abnormal electrocardiogram [ECG] [EKG]: Secondary | ICD-10-CM

## 2014-01-20 DIAGNOSIS — I4891 Unspecified atrial fibrillation: Secondary | ICD-10-CM

## 2014-01-20 DIAGNOSIS — I10 Essential (primary) hypertension: Secondary | ICD-10-CM

## 2014-01-20 DIAGNOSIS — R931 Abnormal findings on diagnostic imaging of heart and coronary circulation: Secondary | ICD-10-CM

## 2014-01-20 NOTE — Telephone Encounter (Addendum)
New message     Patient is calling asking why [name] cancel his surgery on Monday due to abn EKG.     umbilical hernia.     [facility]  [phone] . Please advise.

## 2014-01-20 NOTE — Telephone Encounter (Signed)
Pt returned call. Pt advised of attached msg. Pt will contact office to have abn EHCO worked up. Pt aware surgery will not be rescheduled until we can get cardiac clearance from Dr Mayford Knife.

## 2014-01-20 NOTE — Telephone Encounter (Signed)
Called patient phone number on file (disconnected) called patient's ER contact and left a message for patient to contact our office.  RE:  Patient surgery for 01/23/14 has been cancelled at this time, patient has not followed up with cardiologist after abnormal Echo therefore, we have cancelled his surgery at this time.  Patient need's to contact Dr. Norris Cross office to schedule appointment.

## 2014-01-20 NOTE — Telephone Encounter (Signed)
Return call to patient regarding why surgery was cancelled.  Received a message that patient has spoke to Dr. Norris Cross office and now understands why his surgery has been cancelled.

## 2014-01-20 NOTE — Telephone Encounter (Signed)
Message copied by Maryan Puls on Fri Jan 20, 2014 11:15 AM ------      Message from: Avie Echevaria      Created: Fri Jan 20, 2014 10:57 AM      Contact: [phone]       PLEASE CALL HIM HE HAS SOME QUESTION ABOUT WHY HIS SUGERY WAS CANC. ------

## 2014-01-20 NOTE — Telephone Encounter (Signed)
Spoke with pt and he agreed to study,.. Put order in system.

## 2014-01-20 NOTE — Telephone Encounter (Signed)
Message copied by Maryan Puls on Fri Jan 20, 2014 11:20 AM ------      Message from: Armanda Magic R      Created: Wed Jan 18, 2014 10:10 AM      Regarding: RE: Cardiac Clearance       Patient has not call back after multiple attempts at calling him.  Please see telephone encounters.  Surgeon is being notified and letter is being sent to patient to contact our office      ----- Message -----         From: Maryan Puls, CMA         Sent: 01/17/2014   5:02 PM           To: [name], MD, Axel Filler, MD      Subject: Cardiac Clearance                                        Patient has been scheduled for surgery based on a Cardiac Clearance we received on 12/13/13.  Pre-Admit testing called our office asking for clarification on patient's clearance.  Patient was seen on 12/15/13 with [name] which indicates the patient need's an ECHO, which results were abnormal.              Please let us know if patient is cleared for Umbilical Hernia Repair on 01/23/14.            Maryan Puls, CMA for      Dr. Axel Filler, MD       ------

## 2014-01-23 ENCOUNTER — Encounter: Payer: Self-pay | Admitting: Cardiovascular Disease

## 2014-01-23 ENCOUNTER — Ambulatory Visit (HOSPITAL_COMMUNITY): Admission: RE | Admit: 2014-01-23 | Payer: Medicare Other | Source: Ambulatory Visit | Admitting: General Surgery

## 2014-01-23 ENCOUNTER — Encounter (HOSPITAL_COMMUNITY): Admission: RE | Payer: Self-pay | Source: Ambulatory Visit

## 2014-01-23 SURGERY — REPAIR, HERNIA, UMBILICAL, LAPAROSCOPIC
Anesthesia: General

## 2014-01-24 ENCOUNTER — Other Ambulatory Visit: Payer: Self-pay | Admitting: General Surgery

## 2014-01-24 DIAGNOSIS — I10 Essential (primary) hypertension: Secondary | ICD-10-CM

## 2014-02-05 DIAGNOSIS — N186 End stage renal disease: Secondary | ICD-10-CM | POA: Diagnosis not present

## 2014-02-07 DIAGNOSIS — N186 End stage renal disease: Secondary | ICD-10-CM | POA: Diagnosis not present

## 2014-02-07 DIAGNOSIS — N2581 Secondary hyperparathyroidism of renal origin: Secondary | ICD-10-CM | POA: Diagnosis not present

## 2014-02-07 DIAGNOSIS — D631 Anemia in chronic kidney disease: Secondary | ICD-10-CM | POA: Diagnosis not present

## 2014-02-08 DIAGNOSIS — D631 Anemia in chronic kidney disease: Secondary | ICD-10-CM | POA: Diagnosis not present

## 2014-02-08 DIAGNOSIS — N186 End stage renal disease: Secondary | ICD-10-CM | POA: Diagnosis not present

## 2014-02-08 DIAGNOSIS — N039 Chronic nephritic syndrome with unspecified morphologic changes: Secondary | ICD-10-CM | POA: Diagnosis not present

## 2014-02-08 DIAGNOSIS — N2581 Secondary hyperparathyroidism of renal origin: Secondary | ICD-10-CM | POA: Diagnosis not present

## 2014-02-09 DIAGNOSIS — N2581 Secondary hyperparathyroidism of renal origin: Secondary | ICD-10-CM | POA: Diagnosis not present

## 2014-02-09 DIAGNOSIS — N039 Chronic nephritic syndrome with unspecified morphologic changes: Secondary | ICD-10-CM | POA: Diagnosis not present

## 2014-02-09 DIAGNOSIS — N186 End stage renal disease: Secondary | ICD-10-CM | POA: Diagnosis not present

## 2014-02-09 DIAGNOSIS — D631 Anemia in chronic kidney disease: Secondary | ICD-10-CM | POA: Diagnosis not present

## 2014-02-11 DIAGNOSIS — D631 Anemia in chronic kidney disease: Secondary | ICD-10-CM | POA: Diagnosis not present

## 2014-02-11 DIAGNOSIS — N186 End stage renal disease: Secondary | ICD-10-CM | POA: Diagnosis not present

## 2014-02-11 DIAGNOSIS — N039 Chronic nephritic syndrome with unspecified morphologic changes: Secondary | ICD-10-CM | POA: Diagnosis not present

## 2014-02-11 DIAGNOSIS — N2581 Secondary hyperparathyroidism of renal origin: Secondary | ICD-10-CM | POA: Diagnosis not present

## 2014-02-14 ENCOUNTER — Other Ambulatory Visit (INDEPENDENT_AMBULATORY_CARE_PROVIDER_SITE_OTHER): Payer: Medicare Other

## 2014-02-14 DIAGNOSIS — N2581 Secondary hyperparathyroidism of renal origin: Secondary | ICD-10-CM | POA: Diagnosis not present

## 2014-02-14 DIAGNOSIS — I10 Essential (primary) hypertension: Secondary | ICD-10-CM

## 2014-02-14 DIAGNOSIS — N039 Chronic nephritic syndrome with unspecified morphologic changes: Secondary | ICD-10-CM | POA: Diagnosis not present

## 2014-02-14 DIAGNOSIS — N186 End stage renal disease: Secondary | ICD-10-CM | POA: Diagnosis not present

## 2014-02-14 DIAGNOSIS — D631 Anemia in chronic kidney disease: Secondary | ICD-10-CM | POA: Diagnosis not present

## 2014-02-15 DIAGNOSIS — D631 Anemia in chronic kidney disease: Secondary | ICD-10-CM | POA: Diagnosis not present

## 2014-02-15 DIAGNOSIS — N186 End stage renal disease: Secondary | ICD-10-CM | POA: Diagnosis not present

## 2014-02-15 DIAGNOSIS — N2581 Secondary hyperparathyroidism of renal origin: Secondary | ICD-10-CM | POA: Diagnosis not present

## 2014-02-15 LAB — BASIC METABOLIC PANEL
BUN: 23 mg/dL (ref 6–23)
CHLORIDE: 98 meq/L (ref 96–112)
CO2: 31 meq/L (ref 19–32)
CREATININE: 4.5 mg/dL — AB (ref 0.4–1.5)
Calcium: 8.7 mg/dL (ref 8.4–10.5)
GFR: 14.38 mL/min — CL (ref >60.00)
Glucose, Bld: 85 mg/dL (ref 70–99)
Potassium: 3.7 mEq/L (ref 3.5–5.1)
SODIUM: 136 meq/L (ref 135–145)

## 2014-02-17 ENCOUNTER — Ambulatory Visit (HOSPITAL_COMMUNITY)
Admission: RE | Admit: 2014-02-17 | Discharge: 2014-02-17 | Disposition: A | Discharge status: Home / Self Care | Payer: Medicare Other | Source: Ambulatory Visit | Attending: Cardiology | Admitting: Cardiology

## 2014-02-17 DIAGNOSIS — I10 Essential (primary) hypertension: Secondary | ICD-10-CM

## 2014-02-17 DIAGNOSIS — R9431 Abnormal electrocardiogram [ECG] [EKG]: Secondary | ICD-10-CM

## 2014-02-17 DIAGNOSIS — R599 Enlarged lymph nodes, unspecified: Secondary | ICD-10-CM | POA: Insufficient documentation

## 2014-02-17 DIAGNOSIS — I319 Disease of pericardium, unspecified: Secondary | ICD-10-CM | POA: Diagnosis not present

## 2014-02-17 DIAGNOSIS — I517 Cardiomegaly: Secondary | ICD-10-CM | POA: Insufficient documentation

## 2014-02-17 DIAGNOSIS — I4891 Unspecified atrial fibrillation: Secondary | ICD-10-CM

## 2014-02-17 DIAGNOSIS — R079 Chest pain, unspecified: Secondary | ICD-10-CM | POA: Diagnosis not present

## 2014-02-17 MED ORDER — NITROGLYCERIN 0.4 MG SL SUBL
SUBLINGUAL_TABLET | SUBLINGUAL | Status: AC
  Administered 2014-02-17: 0.4 mg
  Filled 2014-02-17: qty 1

## 2014-02-17 MED ORDER — METOPROLOL TARTRATE 1 MG/ML IV SOLN
5.0000 mg | INTRAVENOUS | Status: AC
  Administered 2014-02-17 (×2): 5 mg via INTRAVENOUS
  Filled 2014-02-17: qty 5

## 2014-02-17 MED ORDER — IOHEXOL 350 MG/ML SOLN
80.0000 mL | Freq: Once | INTRAVENOUS | Status: AC | PRN
  Administered 2014-02-17: 80 mL via INTRAVENOUS

## 2014-02-17 MED ORDER — METOPROLOL TARTRATE 1 MG/ML IV SOLN
INTRAVENOUS | Status: AC
  Administered 2014-02-17: 5 mg
  Filled 2014-02-17: qty 15

## 2014-02-18 DIAGNOSIS — N2581 Secondary hyperparathyroidism of renal origin: Secondary | ICD-10-CM | POA: Diagnosis not present

## 2014-02-18 DIAGNOSIS — N186 End stage renal disease: Secondary | ICD-10-CM | POA: Diagnosis not present

## 2014-02-18 DIAGNOSIS — D631 Anemia in chronic kidney disease: Secondary | ICD-10-CM | POA: Diagnosis not present

## 2014-02-20 ENCOUNTER — Other Ambulatory Visit: Payer: Self-pay | Admitting: General Surgery

## 2014-02-20 DIAGNOSIS — N2581 Secondary hyperparathyroidism of renal origin: Secondary | ICD-10-CM | POA: Diagnosis not present

## 2014-02-20 DIAGNOSIS — D631 Anemia in chronic kidney disease: Secondary | ICD-10-CM | POA: Diagnosis not present

## 2014-02-20 DIAGNOSIS — N039 Chronic nephritic syndrome with unspecified morphologic changes: Secondary | ICD-10-CM | POA: Diagnosis not present

## 2014-02-20 DIAGNOSIS — I2584 Coronary atherosclerosis due to calcified coronary lesion: Principal | ICD-10-CM

## 2014-02-20 DIAGNOSIS — I2510 Atherosclerotic heart disease of native coronary artery without angina pectoris: Secondary | ICD-10-CM

## 2014-02-20 DIAGNOSIS — N186 End stage renal disease: Secondary | ICD-10-CM | POA: Diagnosis not present

## 2014-02-20 NOTE — Progress Notes (Signed)
Dilated Cardiomyopathy 

## 2014-02-22 DIAGNOSIS — D631 Anemia in chronic kidney disease: Secondary | ICD-10-CM | POA: Diagnosis not present

## 2014-02-22 DIAGNOSIS — N186 End stage renal disease: Secondary | ICD-10-CM | POA: Diagnosis not present

## 2014-02-22 DIAGNOSIS — N2581 Secondary hyperparathyroidism of renal origin: Secondary | ICD-10-CM | POA: Diagnosis not present

## 2014-02-23 ENCOUNTER — Telehealth: Payer: Self-pay | Admitting: Cardiology

## 2014-02-23 NOTE — Telephone Encounter (Signed)
New Message:  Pt states he is calling back to reschedule a hospital procedure.Marland Kitchen Hernia surgery.. States [name] had canceled and it has not been rescheduled.Marland KitchenMarland Kitchen

## 2014-02-23 NOTE — Telephone Encounter (Signed)
Informed pt he had to call his surgeon to r/s surgery. We could not schedule that for him.

## 2014-02-24 DIAGNOSIS — D631 Anemia in chronic kidney disease: Secondary | ICD-10-CM | POA: Diagnosis not present

## 2014-02-24 DIAGNOSIS — N186 End stage renal disease: Secondary | ICD-10-CM | POA: Diagnosis not present

## 2014-02-24 DIAGNOSIS — N2581 Secondary hyperparathyroidism of renal origin: Secondary | ICD-10-CM | POA: Diagnosis not present

## 2014-02-24 DIAGNOSIS — N039 Chronic nephritic syndrome with unspecified morphologic changes: Secondary | ICD-10-CM | POA: Diagnosis not present

## 2014-02-27 DIAGNOSIS — N186 End stage renal disease: Secondary | ICD-10-CM | POA: Diagnosis not present

## 2014-02-27 DIAGNOSIS — D631 Anemia in chronic kidney disease: Secondary | ICD-10-CM | POA: Diagnosis not present

## 2014-02-27 DIAGNOSIS — N2581 Secondary hyperparathyroidism of renal origin: Secondary | ICD-10-CM | POA: Diagnosis not present

## 2014-02-28 DIAGNOSIS — T82898A Other specified complication of vascular prosthetic devices, implants and grafts, initial encounter: Secondary | ICD-10-CM | POA: Diagnosis not present

## 2014-02-28 DIAGNOSIS — I871 Compression of vein: Secondary | ICD-10-CM | POA: Diagnosis not present

## 2014-02-28 DIAGNOSIS — N186 End stage renal disease: Secondary | ICD-10-CM | POA: Diagnosis not present

## 2014-03-01 ENCOUNTER — Encounter: Payer: Self-pay | Admitting: Cardiology

## 2014-03-01 ENCOUNTER — Ambulatory Visit (INDEPENDENT_AMBULATORY_CARE_PROVIDER_SITE_OTHER): Payer: Medicare Other | Admitting: Cardiology

## 2014-03-01 VITALS — BP 140/90 | HR 86 | Ht 72.0 in | Wt 223.0 lb

## 2014-03-01 DIAGNOSIS — I428 Other cardiomyopathies: Secondary | ICD-10-CM

## 2014-03-01 DIAGNOSIS — I4891 Unspecified atrial fibrillation: Secondary | ICD-10-CM | POA: Diagnosis not present

## 2014-03-01 DIAGNOSIS — I10 Essential (primary) hypertension: Secondary | ICD-10-CM

## 2014-03-01 DIAGNOSIS — I420 Dilated cardiomyopathy: Secondary | ICD-10-CM

## 2014-03-01 DIAGNOSIS — I2510 Atherosclerotic heart disease of native coronary artery without angina pectoris: Secondary | ICD-10-CM

## 2014-03-01 DIAGNOSIS — I2584 Coronary atherosclerosis due to calcified coronary lesion: Secondary | ICD-10-CM

## 2014-03-01 DIAGNOSIS — I482 Chronic atrial fibrillation, unspecified: Secondary | ICD-10-CM

## 2014-03-01 NOTE — Patient Instructions (Addendum)
Your physician recommends that you continue on your current medications as directed. Please refer to the Current Medication list given to you today.  Your physician has requested that you have an echocardiogram. Echocardiography is a painless test that uses sound waves to create images of your heart. It provides your doctor with information about the size and shape of your heart and how well your heart's chambers and valves are working. This procedure takes approximately one hour. There are no restrictions for this procedure. (Scheduled in September)  Your physician recommends that you return for a FASTING lipid profile and ALT: Please schedule before leaving today  Your physician wants you to follow-up in: 6 months with Dr Sherlyn Lick will receive a reminder letter in the mail two months in advance. If you don't receive a letter, please call our office to schedule the follow-up appointment.

## 2014-03-01 NOTE — Progress Notes (Signed)
[address] [place], [place]  [address] Phone: [phone] Fax:  [phone]  Date:  03/01/2014   ID:  Trevor [name], DOB [birth year], MRN [id number]  PCP:  Dyke Maes, MD  Cardiologist:  Armanda Magic, MD     History of Present Illness: Trevor [name] is a 63 y.o. male with a history of HTN, ESRD, afib, systemic anticoagulation and syncope secondary felt secondary to afib with RVR who presents back today for followup. He is on chronic anticoagulation with coumadin. He recently underwent DCCV to NSR on 12/22. He then developed a large hematoma on his back and developed fevers and cough and presented to the ER on 12/25. He was taken off Coumadin due to large hematoma significant anemia and is now just on ASA. He was seen recently by Urgent Care to determine whether it was safe to restart Coumadin. His hematoma had completely resolved and his PCP says he was ok to restart coumadin. He refused to go back on coumadin at his last OV.  He denies any chest pain, palpitations or syncope. Intermittently he will have some SOB which occurs after walking long distances and is stable.  It usually occurs if he is tired. He has an umbilical hernia which he needs to have repaired and is waiting to get it scheduled.    Wt Readings from Last 3 Encounters:  03/01/14 223 lb (101.152 kg)  01/16/14 228 lb 11.2 oz (103.738 kg)  12/15/13 227 lb (102.967 kg)     Past Medical History  Diagnosis Date  . Anemia   . AVF (arteriovenous fistula)     Left  . Secondary hyperparathyroidism   . Hypovitaminosis D   . Hypertensive urgency     H/o  . CHF (congestive heart failure)   . Exertional shortness of breath     "related to infection in my lungs right now" (05/03/2013)  . History of gout     "before I started doing the dialysis" (05/03/2013)  . Sleep apnea   . GERD (gastroesophageal reflux disease)   . Headache(784.0)   . Syncope     felt secondary to residual anesthesia the day before -  2D echo unremarkable  . Pulmonary embolism     with right DVT secondary to recent surgery  . Atrial fibrillation     not on coumadin due to large spontaneous hematoma on back and anemia  . Myocardial infarction 90's  . Coronary artery disease   . Dysrhythmia     afib  . Peripheral vascular disease     dvt leg 12/14  . ESRD (end stage renal disease) on dialysis     [facility] tues ,thurs,sat    Current Outpatient Prescriptions  Medication Sig Dispense Refill  . amLODipine (NORVASC) 10 MG tablet Take 10 mg by mouth daily.        Marland Kitchen aspirin EC 81 MG tablet Take 1 tablet (81 mg total) by mouth daily.  30 tablet  0  . labetalol (NORMODYNE) 200 MG tablet Take 200 mg by mouth 2 (two) times daily.      . sevelamer carbonate (RENVELA) 800 MG tablet Take 2,400-3,200 mg by mouth See admin instructions. 3200mg  with each meal, 2400-3200mg  with each snack      . vitamin C (ASCORBIC ACID) 500 MG tablet Take 500-1,500 mg by mouth daily.       No current facility-administered medications for this visit.    Allergies:   No Known Allergies  Social  History:  The patient  reports that he quit smoking about 41 years ago. His smoking use included Cigarettes. He has a .125 pack-year smoking history. He has never used smokeless tobacco. He reports that he drinks alcohol. He reports that he does not use illicit drugs.   Family History:  The patient's family history includes Allergies in his father; Deep vein thrombosis in his sister; Diabetes in his paternal grandmother; Hypertension in his father; Kidney disease in his father; Pulmonary embolism in his sister.   ROS:  Please see the history of present illness.      All other systems reviewed and negative.   PHYSICAL EXAM: VS:  BP 140/90  Pulse 86  Ht 6' (1.829 m)  Wt 223 lb (101.152 kg)  BMI 30.24 kg/m2 Well nourished, well developed, in no acute distress HEENT: normal Neck: no JVD Cardiac:  normal S1, S2; irregularly irregular; no murmur Lungs:   clear to auscultation bilaterally, no wheezing, rhonchi or rales Abd: soft, nontender, no hepatomegaly Ext: no edema Skin: warm and dry Neuro:  CNs 2-12 intact, no focal abnormalities noted      ASSESSMENT AND PLAN:  1. Chronic atrial fibrillation rate controlled  - continue labetalol  - we discussed going back on Coumadin and at this point he is adamant that he does not want to be on anticoagulation. He understands the increased risk of CVA off anticoagulation and accepts the risk. He want to get his hernia fixed first and then reconsider.  2. HTN - borderline controlled  - continue amlodipine/Labetatolol  3. Recent DVT with PE off anticoagulation due to anemia and hematoma  5. Abnormal EKG with diffuse ST/T wave abnormality - he had a recent low risk nuclear stress test with a fixed inferior defect with 2D echo done 04/2013 showing normal LVF with no RWMA's. Repeat echo showed reduced LVF and he underwent coronary CTA showing significantly calcified coronary arteries but no obstructive lesions.   6.  ASCAD by CTA with no obstructive lesions.  He needs aggressive risk factor modification.  I will set him up to get a fasting lipid panel.   7.  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  Mild to moderate LV dysfunction most likely secondary to hypertensive DCM vs. Tachycardia induced from prior afib.  His BP is still elevated.  I will check with his renal MD to see if he is ok with starting patient on an ACE I for LV dysfunction as well as BP control.   Followup with me in 6 months  Signed, Armanda Magicraci [name], MD 03/01/2014 11:24 AM

## 2014-03-03 DIAGNOSIS — N186 End stage renal disease: Secondary | ICD-10-CM | POA: Diagnosis not present

## 2014-03-03 DIAGNOSIS — N2581 Secondary hyperparathyroidism of renal origin: Secondary | ICD-10-CM | POA: Diagnosis not present

## 2014-03-03 DIAGNOSIS — D631 Anemia in chronic kidney disease: Secondary | ICD-10-CM | POA: Diagnosis not present

## 2014-03-06 ENCOUNTER — Telehealth (INDEPENDENT_AMBULATORY_CARE_PROVIDER_SITE_OTHER): Payer: Self-pay

## 2014-03-06 DIAGNOSIS — D631 Anemia in chronic kidney disease: Secondary | ICD-10-CM | POA: Diagnosis not present

## 2014-03-06 DIAGNOSIS — N2581 Secondary hyperparathyroidism of renal origin: Secondary | ICD-10-CM | POA: Diagnosis not present

## 2014-03-06 DIAGNOSIS — N039 Chronic nephritic syndrome with unspecified morphologic changes: Secondary | ICD-10-CM | POA: Diagnosis not present

## 2014-03-06 DIAGNOSIS — N186 End stage renal disease: Secondary | ICD-10-CM | POA: Diagnosis not present

## 2014-03-06 NOTE — Telephone Encounter (Signed)
Message copied by Maryan Puls on Mon Mar 06, 2014 11:50 AM ------      Message from: [name]      Created: Fri Mar 03, 2014  9:56 PM      Regarding: RE: Pre-Op Clearance       Patient cleared from cardiac standpoint for surgery.  He has ESRD and LF dysfunction so need to use IVF's cautiously to avoid CHF      ----- Message -----         From: Maryan Puls, CMA         Sent: 03/03/2014   4:39 PM           To: [name], MD, Axel Filler, MD      Subject: Pre-Op Clearance                                         [name],            This gentleman was seen on 03/01/14 for pre-op cardiac clearance for Hernia Repair Surgery.  Just checking the status if patient is cleared for surgery or needs further testing and follow up with you.            Maryan Puls, CMA for      Dr. Axel Filler, MD       ------

## 2014-03-07 ENCOUNTER — Other Ambulatory Visit (INDEPENDENT_AMBULATORY_CARE_PROVIDER_SITE_OTHER): Payer: Medicare Other

## 2014-03-07 DIAGNOSIS — N186 End stage renal disease: Secondary | ICD-10-CM | POA: Diagnosis not present

## 2014-03-07 DIAGNOSIS — I2510 Atherosclerotic heart disease of native coronary artery without angina pectoris: Secondary | ICD-10-CM

## 2014-03-07 LAB — LIPID PANEL
CHOL/HDL RATIO: 2
Cholesterol: 83 mg/dL (ref 0–200)
HDL: 54.7 mg/dL (ref >39.00)
LDL CALC: 19 mg/dL (ref 0–99)
NonHDL: 28.3
Triglycerides: 49 mg/dL (ref 0.0–149.0)
VLDL: 9.8 mg/dL (ref 0.0–40.0)

## 2014-03-07 LAB — ALT: ALT: 10 U/L (ref 0–53)

## 2014-03-08 DIAGNOSIS — I4891 Unspecified atrial fibrillation: Secondary | ICD-10-CM | POA: Diagnosis not present

## 2014-03-08 DIAGNOSIS — D631 Anemia in chronic kidney disease: Secondary | ICD-10-CM | POA: Diagnosis not present

## 2014-03-08 DIAGNOSIS — N039 Chronic nephritic syndrome with unspecified morphologic changes: Secondary | ICD-10-CM | POA: Diagnosis not present

## 2014-03-08 DIAGNOSIS — N186 End stage renal disease: Secondary | ICD-10-CM | POA: Diagnosis not present

## 2014-03-08 DIAGNOSIS — D509 Iron deficiency anemia, unspecified: Secondary | ICD-10-CM | POA: Diagnosis not present

## 2014-03-08 DIAGNOSIS — N2581 Secondary hyperparathyroidism of renal origin: Secondary | ICD-10-CM | POA: Diagnosis not present

## 2014-03-09 ENCOUNTER — Encounter: Payer: Self-pay | Admitting: General Surgery

## 2014-03-14 ENCOUNTER — Encounter (HOSPITAL_COMMUNITY)
Admission: RE | Admit: 2014-03-14 | Discharge: 2014-03-14 | Disposition: A | Discharge status: Home / Self Care | Payer: Medicare Other | Source: Ambulatory Visit | Attending: General Surgery | Admitting: General Surgery

## 2014-03-14 ENCOUNTER — Other Ambulatory Visit (INDEPENDENT_AMBULATORY_CARE_PROVIDER_SITE_OTHER): Payer: Self-pay | Admitting: General Surgery

## 2014-03-14 ENCOUNTER — Encounter (HOSPITAL_COMMUNITY): Payer: Self-pay

## 2014-03-14 ENCOUNTER — Telehealth (INDEPENDENT_AMBULATORY_CARE_PROVIDER_SITE_OTHER): Payer: Self-pay

## 2014-03-14 DIAGNOSIS — Z01812 Encounter for preprocedural laboratory examination: Secondary | ICD-10-CM | POA: Insufficient documentation

## 2014-03-14 HISTORY — DX: Essential (primary) hypertension: I10

## 2014-03-14 LAB — CBC
HEMATOCRIT: 33.2 % — AB (ref 39.0–52.0)
Hemoglobin: 11.2 g/dL — ABNORMAL LOW (ref 13.0–17.0)
MCH: 28.8 pg (ref 26.0–34.0)
MCHC: 33.7 g/dL (ref 30.0–36.0)
MCV: 85.3 fL (ref 78.0–100.0)
Platelets: 90 10*3/uL — ABNORMAL LOW (ref 150–400)
RBC: 3.89 MIL/uL — AB (ref 4.22–5.81)
RDW: 20.4 % — ABNORMAL HIGH (ref 11.5–15.5)
WBC: 2.2 10*3/uL — AB (ref 4.0–10.5)

## 2014-03-14 LAB — BASIC METABOLIC PANEL
Anion gap: 16 — ABNORMAL HIGH (ref 5–15)
BUN: 33 mg/dL — ABNORMAL HIGH (ref 6–23)
CO2: 27 meq/L (ref 19–32)
Calcium: 9.1 mg/dL (ref 8.4–10.5)
Chloride: 96 mEq/L (ref 96–112)
Creatinine, Ser: 6.08 mg/dL — ABNORMAL HIGH (ref 0.50–1.35)
GFR calc Af Amer: 10 mL/min — ABNORMAL LOW (ref >90)
GFR, EST NON AFRICAN AMERICAN: 9 mL/min — AB (ref >90)
Glucose, Bld: 82 mg/dL (ref 70–99)
POTASSIUM: 4.4 meq/L (ref 3.7–5.3)
SODIUM: 139 meq/L (ref 137–147)

## 2014-03-14 NOTE — Progress Notes (Signed)
Called Dr. Jacinto Halim office for orders, none in epic.

## 2014-03-14 NOTE — Progress Notes (Signed)
PCP: [name] Cardiologist: Dr. Gloris Manchester turner  Pt. has not been instructed to stop aspirin (takes it for A-fib). Spoke to dr. Jacinto Halim nurse, Silva Bandy, she stated she will contact him and they will call the patient regarding this issue. Pt. Also expressing he wants to spend the night at hospital and have dialysis the next day here at [place]. Silva Bandy also informed of this  And will discuss with Dr. Derrell Lolling and follow up with patient.

## 2014-03-14 NOTE — Telephone Encounter (Signed)
Called and spoke to patient regarding holding his aspirin 5 days prior to surgery.  Patient also advised that he would not need an overnight stay in the hospital and can resume Dialysis on his normal scheduled days (Mon, Wed, Friday)  Patient states he needs an overnight stay due to anesthesia.  Patient advised that I will send message to Dr. Derrell Lolling for further review.

## 2014-03-14 NOTE — Pre-Procedure Instructions (Signed)
RAYAN MONTIE  03/14/2014   Your procedure is scheduled on:  Tuesday, July 14  Report to [facility] Main Entrance "A" at 12:30 PM.  Call this number if you have problems the morning of surgery: [phone]   Remember:   Do not eat food or drink liquids after midnight.   Take these medicines the morning of surgery with A SIP OF WATER: norvasc, labetalol,    Do not wear jewelry, make-up or nail polish.  Do not wear lotions, powders, or perfumes. You may wear deodorant.  Do not shave 48 hours prior to surgery. Men may shave face and neck.  Do not bring valuables to the hospital.  [facility] is not responsible    for any belongings or valuables.               Contacts, dentures or bridgework may not be worn into surgery.  Leave suitcase in the car. After surgery it may be brought to your room.  For patients admitted to the hospital, discharge time is determined by your   treatment team.               Patients discharged the day of surgery will not be allowed to drive home.  Name and phone number of your driver:   Special Instructions: review preparing for surgery   Please read over the following fact sheets that you were given: Pain Booklet, Coughing and Deep Breathing and Surgical Site Infection Prevention

## 2014-03-15 NOTE — Progress Notes (Signed)
Anesthesia Chart Evaluation: Patient is a laparoscopic umbilical hernia repair with mesh on 03/21/14 by Dr. Derrell Lolling. Surgery was initially scheduled for 01/23/14, but his cardiologist felt he would require additional testing preoperatively (see below).  History includes ESRD with HD MWF (LUE basilic vein transposition), syncope felt related to afib with RVR s/p cardioversion 08/29/13 complicated by right thorax hematoma 09/01/13 (later developed recurrent afib), CAD/MI '90's, RLE DVT/possible small PE (versus respiratory motion on CT) 06/28/13, CHF, OSA, former smoker, GERD, HTN, anemia. Nephrologist is [name]. Cardiologist is [name]. She cleared him from a cardiac standpoint with recommendations to use IVF's cautiously and avoid CHF. Due to his prior complications on anticoagulation therapy, he has currently refused therapy. He will reconsider anticoagulation use following recovery from surgery.  EKG on 12/15/13 showed: Atrial fibrillation/flutter with CVR, left posterior fascicular block. ST/T wave abnormality consider anterolateral ischemia - no change from prior EKG.  Nuclear stress test on 09/27/13 showed: Overall Impression: Low risk stress nuclear study Small inferior wall infarct from apex to base with no ischemia LV appears enlarged No gating due to afib. LV Ejection Fraction: not gated. LV Wall Motion: No gating.   Echo on 12/19/13 showed:  - Left ventricle: Wall thickness was increased in a pattern of mild LVH. Systolic function was moderately reduced. The estimated ejection fraction was in the range of 35% to 40%. Diffuse hypokinesis. The study is not technically sufficient to allow evaluation of LV diastolic function.  - Ventricular septum: The contour showed diastolic flattening. These changes are consistent with RV volume overload. - Aortic valve: Mild regurgitation. - Mitral valve: Calcified annulus. - Left atrium: The atrium was severely dilated. - Right ventricle: The cavity  size was severely dilated. Systolic function was moderately reduced. - Right atrium: The atrium was severely dilated. - Atrial septum: No defect or patent foramen ovale was identified. - Tricuspid valve: Moderate-severe regurgitation directed centrally. - Pulmonary arteries: Systolic pressure was moderately increased. PA peak pressure: 63mm Hg (S). - Pericardium, extracardiac: A trivial pericardial effusion was identified posterior to the heart. (EF was previously 50-55% on 04/29/13. His 09/2013 nuclear stress test showed a fixed defect in the inferior wall and he has coronary artery calcifications on his chest CT, so [name] recommended a coronary artery CTA to evaluate calcium score and for CAD. See below.)  Cardiac CTA on 02/17/14 showed:  1) Suboptimal study due to atrial fibrillation and high calcium score  2) Calcium score 512 scattered through out all three major coronary arteries This was 86th percentile for age and sex matched controls  3) No hemodynamically significant coronary artery obstruction seen with poor visualization of distal LAD and circumflex  4) Bulky calcified hilar and mediastinal adenopathy consistent with granulomatous disease See separate report from [facility]  CXR on 01/16/14 showed: Cardiomegaly. Central vascular congestion without pulmonary edema. No segmental infiltrate.  Preoperative labs noted.  WBC 2.2, H/H 11.2/33.2.  (It's too late to add a differential, but previous WBC has been in the 3 range.) PLT count low at 90K.  He has had intermittent PLT counts in the 110's, but overall no chronic pattern of thrombocytopenia noted.   Labs are marked as reviewed by Dr. Derrell Lolling.  Patient will be getting an ISTAT on arrival--I'll also add a repeat PLT count to ensure no significant drop in his counts. If same day labs are felt acceptable for OR then I would anticipate that he could proceed as planned.      Shonna Chock, PA-C  [facility]  [facility]/Anesthesiology Phone [phone] 03/15/2014 3:50 PM

## 2014-03-16 ENCOUNTER — Telehealth: Payer: Self-pay | Admitting: Cardiology

## 2014-03-16 NOTE — Telephone Encounter (Signed)
Spoke with pt and gave reference ranges for pt.

## 2014-03-16 NOTE — Telephone Encounter (Signed)
lmtrc

## 2014-03-16 NOTE — Telephone Encounter (Signed)
New message    Patient has questions regarding labs

## 2014-03-20 MED ORDER — CEFAZOLIN SODIUM-DEXTROSE 2-3 GM-% IV SOLR
2.0000 g | INTRAVENOUS | Status: AC
  Administered 2014-03-21: 2 g via INTRAVENOUS
  Filled 2014-03-20: qty 50

## 2014-03-20 NOTE — Progress Notes (Signed)
Pat called with time change. To arrive at 1330.

## 2014-03-21 ENCOUNTER — Observation Stay (HOSPITAL_COMMUNITY)
Admission: RE | Admit: 2014-03-21 | Discharge: 2014-03-23 | Disposition: A | Discharge status: Home / Self Care | Payer: Medicare Other | Source: Ambulatory Visit | Attending: General Surgery | Admitting: General Surgery

## 2014-03-21 ENCOUNTER — Ambulatory Visit (HOSPITAL_COMMUNITY): Payer: Medicare Other | Admitting: Anesthesiology

## 2014-03-21 ENCOUNTER — Encounter (HOSPITAL_COMMUNITY)
Admission: RE | Disposition: A | Discharge status: Home / Self Care | Payer: Self-pay | Source: Ambulatory Visit | Attending: General Surgery

## 2014-03-21 ENCOUNTER — Encounter (HOSPITAL_COMMUNITY): Payer: Self-pay | Admitting: *Deleted

## 2014-03-21 ENCOUNTER — Encounter (HOSPITAL_COMMUNITY): Payer: Medicare Other | Admitting: Vascular Surgery

## 2014-03-21 DIAGNOSIS — Z86711 Personal history of pulmonary embolism: Secondary | ICD-10-CM | POA: Insufficient documentation

## 2014-03-21 DIAGNOSIS — I4891 Unspecified atrial fibrillation: Secondary | ICD-10-CM | POA: Diagnosis not present

## 2014-03-21 DIAGNOSIS — Z8719 Personal history of other diseases of the digestive system: Secondary | ICD-10-CM

## 2014-03-21 DIAGNOSIS — Z7982 Long term (current) use of aspirin: Secondary | ICD-10-CM | POA: Insufficient documentation

## 2014-03-21 DIAGNOSIS — K429 Umbilical hernia without obstruction or gangrene: Secondary | ICD-10-CM | POA: Diagnosis not present

## 2014-03-21 DIAGNOSIS — Z87891 Personal history of nicotine dependence: Secondary | ICD-10-CM | POA: Diagnosis not present

## 2014-03-21 DIAGNOSIS — Z86718 Personal history of other venous thrombosis and embolism: Secondary | ICD-10-CM | POA: Diagnosis not present

## 2014-03-21 DIAGNOSIS — G4730 Sleep apnea, unspecified: Secondary | ICD-10-CM | POA: Diagnosis not present

## 2014-03-21 DIAGNOSIS — N186 End stage renal disease: Secondary | ICD-10-CM | POA: Insufficient documentation

## 2014-03-21 DIAGNOSIS — I2510 Atherosclerotic heart disease of native coronary artery without angina pectoris: Secondary | ICD-10-CM | POA: Diagnosis not present

## 2014-03-21 DIAGNOSIS — D649 Anemia, unspecified: Secondary | ICD-10-CM | POA: Insufficient documentation

## 2014-03-21 DIAGNOSIS — I252 Old myocardial infarction: Secondary | ICD-10-CM | POA: Diagnosis not present

## 2014-03-21 DIAGNOSIS — I509 Heart failure, unspecified: Secondary | ICD-10-CM | POA: Insufficient documentation

## 2014-03-21 DIAGNOSIS — Z79899 Other long term (current) drug therapy: Secondary | ICD-10-CM | POA: Diagnosis not present

## 2014-03-21 DIAGNOSIS — Z992 Dependence on renal dialysis: Secondary | ICD-10-CM | POA: Diagnosis not present

## 2014-03-21 DIAGNOSIS — I120 Hypertensive chronic kidney disease with stage 5 chronic kidney disease or end stage renal disease: Secondary | ICD-10-CM | POA: Insufficient documentation

## 2014-03-21 DIAGNOSIS — Z9889 Other specified postprocedural states: Secondary | ICD-10-CM

## 2014-03-21 DIAGNOSIS — K219 Gastro-esophageal reflux disease without esophagitis: Secondary | ICD-10-CM | POA: Insufficient documentation

## 2014-03-21 HISTORY — PX: HERNIA REPAIR: SHX51

## 2014-03-21 HISTORY — PX: UMBILICAL HERNIA REPAIR: SHX196

## 2014-03-21 HISTORY — DX: Other specified postprocedural states: Z98.890

## 2014-03-21 HISTORY — DX: Personal history of other diseases of the digestive system: Z87.19

## 2014-03-21 HISTORY — PX: INSERTION OF MESH: SHX5868

## 2014-03-21 LAB — POCT I-STAT 4, (NA,K, GLUC, HGB,HCT)
Glucose, Bld: 77 mg/dL (ref 70–99)
HCT: 42 % (ref 39.0–52.0)
Hemoglobin: 14.3 g/dL (ref 13.0–17.0)
POTASSIUM: 4.4 meq/L (ref 3.7–5.3)
SODIUM: 139 meq/L (ref 137–147)

## 2014-03-21 LAB — PLATELET COUNT: Platelets: 76 10*3/uL — ABNORMAL LOW (ref 150–400)

## 2014-03-21 SURGERY — REPAIR, HERNIA, UMBILICAL, LAPAROSCOPIC
Anesthesia: General

## 2014-03-21 MED ORDER — VITAMIN C 500 MG PO TABS
500.0000 mg | ORAL_TABLET | Freq: Every day | ORAL | Status: DC
  Administered 2014-03-21 – 2014-03-23 (×2): 500 mg via ORAL
  Filled 2014-03-21 (×3): qty 1

## 2014-03-21 MED ORDER — MIDAZOLAM HCL 2 MG/2ML IJ SOLN
INTRAMUSCULAR | Status: AC
  Filled 2014-03-21: qty 2

## 2014-03-21 MED ORDER — FENTANYL CITRATE 0.05 MG/ML IJ SOLN
INTRAMUSCULAR | Status: AC
  Filled 2014-03-21: qty 5

## 2014-03-21 MED ORDER — DIPHENHYDRAMINE HCL 50 MG/ML IJ SOLN
25.0000 mg | Freq: Four times a day (QID) | INTRAMUSCULAR | Status: DC | PRN
  Administered 2014-03-21 – 2014-03-22 (×3): 25 mg via INTRAVENOUS
  Filled 2014-03-21 (×2): qty 1

## 2014-03-21 MED ORDER — ARTIFICIAL TEARS OP OINT
TOPICAL_OINTMENT | OPHTHALMIC | Status: AC
  Filled 2014-03-21: qty 3.5

## 2014-03-21 MED ORDER — ONDANSETRON HCL 4 MG/2ML IJ SOLN: 4.0000 mg | Freq: Once | INTRAMUSCULAR | Status: DC | PRN

## 2014-03-21 MED ORDER — OXYCODONE HCL 5 MG PO TABS: 5.0000 mg | ORAL_TABLET | Freq: Once | ORAL | Status: DC | PRN

## 2014-03-21 MED ORDER — HYDROMORPHONE HCL PF 1 MG/ML IJ SOLN
0.2500 mg | INTRAMUSCULAR | Status: DC | PRN
  Administered 2014-03-21 (×2): 0.5 mg via INTRAVENOUS

## 2014-03-21 MED ORDER — ARTIFICIAL TEARS OP OINT
TOPICAL_OINTMENT | OPHTHALMIC | Status: DC | PRN
  Administered 2014-03-21: 1 via OPHTHALMIC

## 2014-03-21 MED ORDER — BUPIVACAINE HCL 0.25 % IJ SOLN
INTRAMUSCULAR | Status: DC | PRN
  Administered 2014-03-21: 5 mL

## 2014-03-21 MED ORDER — AMLODIPINE BESYLATE 10 MG PO TABS
10.0000 mg | ORAL_TABLET | Freq: Every day | ORAL | Status: DC
  Administered 2014-03-21 – 2014-03-23 (×2): 10 mg via ORAL
  Filled 2014-03-21 (×2): qty 1
  Filled 2014-03-21: qty 2
  Filled 2014-03-21: qty 1

## 2014-03-21 MED ORDER — GLYCOPYRROLATE 0.2 MG/ML IJ SOLN
INTRAMUSCULAR | Status: AC
Start: 2014-03-21 — End: 2014-03-21
  Filled 2014-03-21: qty 3

## 2014-03-21 MED ORDER — LABETALOL HCL 200 MG PO TABS
200.0000 mg | ORAL_TABLET | Freq: Two times a day (BID) | ORAL | Status: DC
  Administered 2014-03-21 – 2014-03-23 (×2): 200 mg via ORAL
  Filled 2014-03-21 (×5): qty 1
  Filled 2014-03-21: qty 2

## 2014-03-21 MED ORDER — ROCURONIUM BROMIDE 100 MG/10ML IV SOLN
INTRAVENOUS | Status: DC | PRN
  Administered 2014-03-21: 40 mg via INTRAVENOUS
  Administered 2014-03-21: 10 mg via INTRAVENOUS

## 2014-03-21 MED ORDER — LIDOCAINE HCL (CARDIAC) 20 MG/ML IV SOLN
INTRAVENOUS | Status: AC
Start: 2014-03-21 — End: 2014-03-21
  Filled 2014-03-21: qty 5

## 2014-03-21 MED ORDER — PROPOFOL 10 MG/ML IV BOLUS
INTRAVENOUS | Status: DC | PRN
  Administered 2014-03-21: 150 mg via INTRAVENOUS
  Administered 2014-03-21: 30 mg via INTRAVENOUS

## 2014-03-21 MED ORDER — LIDOCAINE HCL (CARDIAC) 20 MG/ML IV SOLN
INTRAVENOUS | Status: DC | PRN
  Administered 2014-03-21: 50 mg via INTRAVENOUS

## 2014-03-21 MED ORDER — NEOSTIGMINE METHYLSULFATE 10 MG/10ML IV SOLN
INTRAVENOUS | Status: AC
  Filled 2014-03-21: qty 1

## 2014-03-21 MED ORDER — VITAMIN C 500 MG PO TABS: 500.0000 mg | ORAL_TABLET | Freq: Every day | ORAL | Status: DC

## 2014-03-21 MED ORDER — OXYCODONE HCL 5 MG/5ML PO SOLN: 5.0000 mg | Freq: Once | ORAL | Status: DC | PRN

## 2014-03-21 MED ORDER — NEOSTIGMINE METHYLSULFATE 10 MG/10ML IV SOLN
INTRAVENOUS | Status: DC | PRN
  Administered 2014-03-21: 4 mg via INTRAVENOUS

## 2014-03-21 MED ORDER — ALBUTEROL SULFATE HFA 108 (90 BASE) MCG/ACT IN AERS
INHALATION_SPRAY | RESPIRATORY_TRACT | Status: DC | PRN
  Administered 2014-03-21: 2 via RESPIRATORY_TRACT

## 2014-03-21 MED ORDER — ONDANSETRON HCL 4 MG/2ML IJ SOLN
INTRAMUSCULAR | Status: AC
  Filled 2014-03-21: qty 4

## 2014-03-21 MED ORDER — ONDANSETRON HCL 4 MG/2ML IJ SOLN
INTRAMUSCULAR | Status: DC | PRN
  Administered 2014-03-21: 4 mg via INTRAVENOUS

## 2014-03-21 MED ORDER — PROPOFOL 10 MG/ML IV BOLUS
INTRAVENOUS | Status: AC
  Filled 2014-03-21: qty 20

## 2014-03-21 MED ORDER — SEVELAMER CARBONATE 800 MG PO TABS: 2400.0000 mg | ORAL_TABLET | ORAL | Status: DC

## 2014-03-21 MED ORDER — SODIUM CHLORIDE 0.9 % IV SOLN
INTRAVENOUS | Status: DC | PRN
  Administered 2014-03-21: 15:00:00 via INTRAVENOUS

## 2014-03-21 MED ORDER — FENTANYL CITRATE 0.05 MG/ML IJ SOLN
INTRAMUSCULAR | Status: DC | PRN
  Administered 2014-03-21: 50 ug via INTRAVENOUS
  Administered 2014-03-21: 100 ug via INTRAVENOUS
  Administered 2014-03-21: 50 ug via INTRAVENOUS

## 2014-03-21 MED ORDER — SEVELAMER CARBONATE 800 MG PO TABS
3200.0000 mg | ORAL_TABLET | Freq: Three times a day (TID) | ORAL | Status: DC
  Administered 2014-03-22 – 2014-03-23 (×3): 3200 mg via ORAL
  Filled 2014-03-21 (×8): qty 4

## 2014-03-21 MED ORDER — SEVELAMER CARBONATE 800 MG PO TABS
2400.0000 mg | ORAL_TABLET | Freq: Three times a day (TID) | ORAL | Status: DC | PRN
  Filled 2014-03-21: qty 3

## 2014-03-21 MED ORDER — GLYCOPYRROLATE 0.2 MG/ML IJ SOLN
INTRAMUSCULAR | Status: DC | PRN
  Administered 2014-03-21: 0.6 mg via INTRAVENOUS

## 2014-03-21 MED ORDER — OXYCODONE HCL 5 MG PO TABS
5.0000 mg | ORAL_TABLET | ORAL | Status: DC | PRN
  Administered 2014-03-21 – 2014-03-23 (×6): 5 mg via ORAL
  Filled 2014-03-21 (×5): qty 1

## 2014-03-21 MED ORDER — DIPHENHYDRAMINE HCL 50 MG/ML IJ SOLN
INTRAMUSCULAR | Status: AC
  Filled 2014-03-21: qty 1

## 2014-03-21 MED ORDER — ALBUTEROL SULFATE HFA 108 (90 BASE) MCG/ACT IN AERS
INHALATION_SPRAY | RESPIRATORY_TRACT | Status: AC
  Filled 2014-03-21: qty 6.7

## 2014-03-21 MED ORDER — PHENYLEPHRINE HCL 10 MG/ML IJ SOLN
INTRAMUSCULAR | Status: DC | PRN
  Administered 2014-03-21 (×2): 40 ug via INTRAVENOUS

## 2014-03-21 MED ORDER — BUPIVACAINE HCL (PF) 0.25 % IJ SOLN
INTRAMUSCULAR | Status: AC
  Filled 2014-03-21: qty 30

## 2014-03-21 MED ORDER — HYDROMORPHONE HCL PF 1 MG/ML IJ SOLN
INTRAMUSCULAR | Status: AC
  Filled 2014-03-21: qty 1

## 2014-03-21 SURGICAL SUPPLY — 44 items
APPLIER CLIP LOGIC TI 5 (MISCELLANEOUS) IMPLANT
APPLIER CLIP ROT 10 11.4 M/L (STAPLE)
BENZOIN TINCTURE PRP APPL 2/3 (GAUZE/BANDAGES/DRESSINGS) ×3 IMPLANT
BLADE SURG ROTATE 9660 (MISCELLANEOUS) IMPLANT
CANISTER SUCTION 2500CC (MISCELLANEOUS) IMPLANT
CHLORAPREP W/TINT 26ML (MISCELLANEOUS) ×3 IMPLANT
CLIP APPLIE ROT 10 11.4 M/L (STAPLE) IMPLANT
CLOSURE STERI-STRIP 1/2X4 (GAUZE/BANDAGES/DRESSINGS) ×1
CLSR STERI-STRIP ANTIMIC 1/2X4 (GAUZE/BANDAGES/DRESSINGS) ×2 IMPLANT
COVER SURGICAL LIGHT HANDLE (MISCELLANEOUS) ×3 IMPLANT
DEVICE SECURE STRAP 25 ABSORB (INSTRUMENTS) ×3 IMPLANT
DEVICE TROCAR PUNCTURE CLOSURE (ENDOMECHANICALS) ×3 IMPLANT
DRAPE UTILITY 15X26 W/TAPE STR (DRAPE) ×6 IMPLANT
ELECT REM PT RETURN 9FT ADLT (ELECTROSURGICAL) ×3
ELECTRODE REM PT RTRN 9FT ADLT (ELECTROSURGICAL) ×1 IMPLANT
GAUZE SPONGE 2X2 8PLY STRL LF (GAUZE/BANDAGES/DRESSINGS) ×1 IMPLANT
GLOVE BIO SURGEON STRL SZ7.5 (GLOVE) ×3 IMPLANT
GOWN STRL REUS W/ TWL LRG LVL3 (GOWN DISPOSABLE) ×2 IMPLANT
GOWN STRL REUS W/ TWL XL LVL3 (GOWN DISPOSABLE) ×1 IMPLANT
GOWN STRL REUS W/TWL LRG LVL3 (GOWN DISPOSABLE) ×4
GOWN STRL REUS W/TWL XL LVL3 (GOWN DISPOSABLE) ×2
KIT BASIN OR (CUSTOM PROCEDURE TRAY) ×3 IMPLANT
KIT ROOM TURNOVER OR (KITS) ×3 IMPLANT
MARKER SKIN DUAL TIP RULER LAB (MISCELLANEOUS) ×3 IMPLANT
MESH VENTRALIGHT ST 4.5IN (Mesh General) ×3 IMPLANT
NEEDLE INSUFFLATION 14GA 120MM (NEEDLE) ×3 IMPLANT
NEEDLE SPNL 22GX3.5 QUINCKE BK (NEEDLE) IMPLANT
NS IRRIG 1000ML POUR BTL (IV SOLUTION) ×3 IMPLANT
PAD ARMBOARD 7.5X6 YLW CONV (MISCELLANEOUS) ×6 IMPLANT
SCISSORS LAP 5X35 DISP (ENDOMECHANICALS) ×3 IMPLANT
SET IRRIG TUBING LAPAROSCOPIC (IRRIGATION / IRRIGATOR) IMPLANT
SLEEVE ENDOPATH XCEL 5M (ENDOMECHANICALS) ×3 IMPLANT
SPONGE GAUZE 2X2 STER 10/PKG (GAUZE/BANDAGES/DRESSINGS) ×2
SPONGE GAUZE 4X4 12PLY (GAUZE/BANDAGES/DRESSINGS) ×3 IMPLANT
SUT CHROMIC 2 0 SH (SUTURE) ×3 IMPLANT
SUT MNCRL AB 4-0 PS2 18 (SUTURE) ×3 IMPLANT
SUT PROLENE 2 0 KS (SUTURE) IMPLANT
TOWEL OR 17X24 6PK STRL BLUE (TOWEL DISPOSABLE) ×3 IMPLANT
TOWEL OR 17X26 10 PK STRL BLUE (TOWEL DISPOSABLE) ×3 IMPLANT
TRAY FOLEY CATH 14FR (SET/KITS/TRAYS/PACK) IMPLANT
TRAY LAPAROSCOPIC (CUSTOM PROCEDURE TRAY) ×3 IMPLANT
TROCAR XCEL BLUNT TIP 100MML (ENDOMECHANICALS) IMPLANT
TROCAR XCEL NON-BLD 11X100MML (ENDOMECHANICALS) IMPLANT
TROCAR XCEL NON-BLD 5MMX100MML (ENDOMECHANICALS) ×3 IMPLANT

## 2014-03-21 NOTE — Transfer of Care (Signed)
Immediate Anesthesia Transfer of Care Note  Patient: Trevor [name]  Procedure(s) Performed: Procedure(s): LAPAROSCOPIC UMBILICAL HERNIA REPAIR WITH MESH (N/A) INSERTION OF MESH (N/A)  Patient Location: PACU  Anesthesia Type:General  Level of Consciousness: awake, alert  and oriented  Airway & Oxygen Therapy: Patient Spontanous Breathing and Patient connected to face mask oxygen  Post-op Assessment: Report given to PACU RN and Post -op Vital signs reviewed and stable  Post vital signs: Reviewed and stable  Complications: No apparent anesthesia complications

## 2014-03-21 NOTE — Anesthesia Preprocedure Evaluation (Addendum)
Anesthesia Evaluation  Patient identified by MRN, date of birth, ID band Patient awake    Reviewed: Allergy & Precautions, H&P , NPO status   Airway Mallampati: II TM Distance: >3 FB Neck ROM: Full    Dental  (+) Poor Dentition, Dental Advisory Given, Partial Upper, Partial Lower   Pulmonary sleep apnea , former smoker,  breath sounds clear to auscultation        Cardiovascular hypertension, Pt. on medications and Pt. on home beta blockers + CAD, + Past MI, +CHF and DVT + dysrhythmias Atrial Fibrillation Rhythm:Irregular Rate:Normal     Neuro/Psych    GI/Hepatic GERD-  ,  Endo/Other    Renal/GU ESRF and DialysisRenal disease     Musculoskeletal   Abdominal   Peds  Hematology   Anesthesia Other Findings   Reproductive/Obstetrics                         Anesthesia Physical Anesthesia Plan  ASA: III  Anesthesia Plan: General   Post-op Pain Management:    Induction: Intravenous  Airway Management Planned: Oral ETT  Additional Equipment:   Intra-op Plan:   Post-operative Plan: Extubation in OR  Informed Consent: I have reviewed the patients History and Physical, chart, labs and discussed the procedure including the risks, benefits and alternatives for the proposed anesthesia with the patient or authorized representative who has indicated his/her understanding and acceptance.   Dental advisory given  Plan Discussed with: CRNA and Anesthesiologist  Anesthesia Plan Comments: (Ventral hernia  ESRD last HD 7/13 K-4.4 Htn Chronic Afib  H/O syncope  CAD S/P MI 1990s, nuclear stress test 09/19/13 (-) for ischemia EF 30-35% by ECHO Sleep apnea  Plan GA with oral ETT )       Anesthesia Quick Evaluation

## 2014-03-21 NOTE — Anesthesia Procedure Notes (Signed)
Procedure Name: Intubation Date/Time: 03/21/2014 3:24 PM Performed by: Leonel Ramsay Pre-anesthesia Checklist: Patient identified, Timeout performed, Emergency Drugs available, Suction available and Patient being monitored Patient Re-evaluated:Patient Re-evaluated prior to inductionOxygen Delivery Method: Circle system utilized Preoxygenation: Pre-oxygenation with 100% oxygen Intubation Type: IV induction Ventilation: Oral airway inserted - appropriate to patient size and Two handed mask ventilation required Laryngoscope Size: Miller and 2 Grade View: Grade I Tube type: Oral Tube size: 7.5 mm Number of attempts: 1 Airway Equipment and Method: Stylet and Oral airway Placement Confirmation: ETT inserted through vocal cords under direct vision,  positive ETCO2 and breath sounds checked- equal and bilateral Secured at: 23 cm Tube secured with: Tape Dental Injury: Teeth and Oropharynx as per pre-operative assessment

## 2014-03-21 NOTE — H&P (Signed)
HPI  Trevor [name] is a 63 y.o. male. The patient is a 63 year old male who is referred by [name] for evaluation of umbilical hernia. This states is been there for several months. The patient does state that he has pain in his periumbilical area. He states it is reducible.  The patient has had a coughing spell the last several months. He states it is been worked up and has not been diagnosed with a specific diagnosis. This has been associated to reflux. The patient also sees [name] his cardiologist for A. Fib and hypertension.  The patient undergoes hemodialysis Tuesday Thursday Saturday.  HPI  Past Medical History   Diagnosis  Date   .  Anemia    .  AVF (arteriovenous fistula)      Left   .  Secondary hyperparathyroidism    .  Hypovitaminosis D    .  Hypertensive urgency      H/o   .  CHF (congestive heart failure)    .  Exertional shortness of breath      "related to infection in my lungs right now" (05/03/2013)   .  History of gout      "before I started doing the dialysis" (05/03/2013)   .  ESRD (end stage renal disease) on dialysis      "[facility]; MWF" (05/03/2013)   .  Sleep apnea    .  GERD (gastroesophageal reflux disease)    .  Headache(784.0)    .  Syncope      felt secondary to residual anesthesia the day before - 2D echo unremarkable   .  Pulmonary embolism      with right DVT secondary to recent surgery   .  Atrial fibrillation      not on coumadin due to large spontaneous hematoma on back and anemia    Past Surgical History   Procedure  Laterality  Date   .  Av fistula placement  Left      Dr. Charlean Sanfilippo; "I've had 2 on the left' (05/03/2013)   .  Av fistula placement  Right  ~ 2011   .  Knee arthroscopy  Left    .  Avgg removal  Right  05/04/2013     Procedure: REMOVAL OF ARTERIOVENOUS Fistula Right Arm; Surgeon: [name], MD; Location: [facility] OR; Service: Vascular; Laterality: Right;   .  Insertion of dialysis catheter  Right  05/04/2013    Procedure: INSERTION OF DIALYSIS CATHETER; Surgeon: [name], MD; Location: [facility] OR; Service: Vascular; Laterality: Right;   .  Colonoscopy       Hx: of   .  Bascilic vein transposition  Left  06/27/2013     Procedure: BASCILIC VEIN TRANSPOSITION; Surgeon: [name], MD; Location: [facility] OR; Service: Vascular; Laterality: Left;   .  Cardioversion  N/A  08/29/2013     Procedure: CARDIOVERSION; Surgeon: [name], MD; Location: [facility] ENDOSCOPY; Service: Cardiovascular; Laterality: N/A;    Family History   Problem  Relation  Age of Onset   .  Hypertension  Father    .  Kidney disease  Father    .  Allergies  Father    .  Deep vein thrombosis  Sister    .  Pulmonary embolism  Sister    .  Diabetes  Paternal Grandmother    Social History  History   Substance Use Topics   .  Smoking status:  Former Smoker -- 0.25  packs/day for .5 years     Types:  Cigarettes     Quit date:  09/08/1972   .  Smokeless tobacco:  Never Used   .  Alcohol Use:  Yes      Comment: 05/03/2013 "haven't had a glass of wine in ~ 3 months or so; sometimes will have one w/dinner"   No Known Allergies  Current Outpatient Prescriptions   Medication  Sig  Dispense  Refill   .  amLODipine (NORVASC) 10 MG tablet  Take 10 mg by mouth daily.     Marland Kitchen.  labetalol (NORMODYNE) 200 MG tablet  Take 200 mg by mouth 2 (two) times daily.     Marland Kitchen.  lidocaine-prilocaine (EMLA) cream  Apply 1 application topically as needed.     .  Nutritional Supplements (ARGINAID EXTRA) LIQD  Take 2,000 mg by mouth.     .  sildenafil (VIAGRA) 50 MG tablet  Take 50 mg by mouth daily as needed for erectile dysfunction.     Marland Kitchen.  aspirin EC 81 MG tablet  Take 1 tablet (81 mg total) by mouth daily.  30 tablet  0   .  Hydrocodone-Acetaminophen 10-300 MG TABS  Take 10-300 mg by mouth.     Marland Kitchen.  omeprazole (PRILOSEC) 20 MG capsule  Take 1 capsule (20 mg total) by mouth 2 (two) times daily before a meal.  60 capsule  12   .  sevelamer carbonate (RENVELA) 800  MG tablet  Take 3,200 mg by mouth as needed (for kidneys).      No current facility-administered medications for this visit.   Review of Systems  Review of Systems  Constitutional: Negative.  HENT: Negative.  Eyes: Negative.  Respiratory: Negative.  Cardiovascular: Negative.  Gastrointestinal: Negative.  Endocrine: Negative.  Neurological: Negative.  Blood pressure 122/76, temperature 97.9 F (36.6 C), temperature source Temporal, resp. rate 18, height 6' 1.5" (1.867 m), weight 231 lb 12.8 oz (105.144 kg).  Physical Exam  Physical Exam  Constitutional: He is oriented to person, place, and time. He appears well-developed and well-nourished.  HENT:  Head: Normocephalic and atraumatic.  Eyes: Conjunctivae and EOM are normal. Pupils are equal, round, and reactive to light.  Neck: Normal range of motion. Neck supple.  Cardiovascular: Normal rate, regular rhythm and normal heart sounds.  Pulmonary/Chest: Effort normal and breath sounds normal.  Abdominal: Soft. Bowel sounds are normal. He exhibits no distension and no mass. There is no tenderness. There is no rebound and no guarding. A hernia is present. Hernia confirmed positive in the ventral area.    Musculoskeletal: Normal range of motion.  Neurological: He is alert and oriented to person, place, and time.  Skin: Skin is warm and dry.  Data Reviewed  none  Assessment  63 year old male with a small umbilical hernia  Plan  1.  We will proceed with a laparoscopic umbilical hernia repair with Mesh. The patient will need to be admitted for 23 hour observation. On his last vascular surgery the patient needed to be brought back to the hospital after being discharged from same-day surgery secondary to syncope.  3. All risks and benefits were discussed with the patient, to generally include infection, bleeding, damage to surrounding structures, acute and chronic nerve pain, and recurrence. Alternatives were offered and described. All  questions were answered and the patient voiced understanding of the procedure and wishes to proceed at this point.  4. We will also consult Dr. Briant CedarMattingly when the patient is admitted for  observation to assist with dialysis orders.

## 2014-03-21 NOTE — Discharge Instructions (Signed)
[facility] _______[facility]  UMBILICAL OR INGUINAL HERNIA REPAIR: POST OP INSTRUCTIONS  Always review your discharge instruction sheet given to you by the facility where your surgery was performed. IF YOU HAVE DISABILITY OR FAMILY LEAVE FORMS, YOU MUST BRING THEM TO THE OFFICE FOR PROCESSING.   DO NOT GIVE THEM TO YOUR DOCTOR.  1. A  prescription for pain medication may be given to you upon discharge.  Take your pain medication as prescribed, if needed.  If narcotic pain medicine is not needed, then you may take acetaminophen (Tylenol) or ibuprofen (Advil) as needed. 2. Take your usually prescribed medications unless otherwise directed. 3. If you need a refill on your pain medication, please contact your pharmacy.  They will contact our office to request authorization. Prescriptions will not be filled after 5 pm or on week-ends. 4. You should follow a light diet the first 24 hours after arrival home, such as soup and crackers, etc.  Be sure to include lots of fluids daily.  Resume your normal diet the day after surgery. 5. Most patients will experience some swelling and bruising around the umbilicus or in the groin and scrotum.  Ice packs and reclining will help.  Swelling and bruising can take several days to resolve.  6. It is common to experience some constipation if taking pain medication after surgery.  Increasing fluid intake and taking a stool softener (such as Colace) will usually help or prevent this problem from occurring.  A mild laxative (Milk of Magnesia or Miralax) should be taken according to package directions if there are no bowel movements after 48 hours. 7. Unless discharge instructions indicate otherwise, you may remove your bandages 24-48 hours after surgery, and you may shower at that time.  You may have steri-strips (small skin tapes) in place directly over the incision.  These strips should be left on the skin for 7-10 days.  If your surgeon used skin glue on the  incision, you may shower in 24 hours.  The glue will flake off over the next 2-3 weeks.  Any sutures or staples will be removed at the office during your follow-up visit. 8. ACTIVITIES:  You may resume regular (light) daily activities beginning the next day--such as daily self-care, walking, climbing stairs--gradually increasing activities as tolerated.  You may have sexual intercourse when it is comfortable.  Refrain from any heavy lifting or straining until approved by your doctor. a. You may drive when you are no longer taking prescription pain medication, you can comfortably wear a seatbelt, and you can safely maneuver your car and apply brakes. b. RETURN TO WORK:  __________________________________________________________ 9. You should see your doctor in the office for a follow-up appointment approximately 2-3 weeks after your surgery.  Make sure that you call for this appointment within a day or two after you arrive home to insure a convenient appointment time. 10. OTHER INSTRUCTIONS:  __________________________________________________________________________________________________________________________________________________________________________________________  WHEN TO CALL YOUR DOCTOR: 1. Fever over 101.0 2. Inability to urinate 3. Nausea and/or vomiting 4. Extreme swelling or bruising 5. Continued bleeding from incision. 6. Increased pain, redness, or drainage from the incision  The clinic staff is available to answer your questions during regular business hours.  Please don't hesitate to call and ask to speak to one of the nurses for clinical concerns.  If you have a medical emergency, go to the nearest emergency room or call 911.  A surgeon from [facility] is always on call at the hospital     [address], [address], [place], [place]  [address] ?  [address] [address], [place], [place]   [address] [phone] ? [phone] ? FAX [phone] Web site:  [email or web address]  

## 2014-03-21 NOTE — Op Note (Signed)
03/21/2014  4:20 PM  PATIENT:  Trevor [name]  63 y.o. male  PRE-OPERATIVE DIAGNOSIS:  umbilical hernia  POST-OPERATIVE DIAGNOSIS:  umbilical hernia  PROCEDURE:  Procedure(s): LAPAROSCOPIC UMBILICAL HERNIA REPAIR WITH MESH (N/A) INSERTION OF MESH (N/A)  SURGEON:  Surgeon(s) and Role:    * Axel Filler, MD - Primary  ASSISTANTS: Norm Salt, PA-student   ANESTHESIA:   local and general  EBL:  Total I/O In: 350 [I.V.:350] Out: -   BLOOD ADMINISTERED:none  DRAINS: none   LOCAL MEDICATIONS USED:  BUPIVICAINE   SPECIMEN:  No Specimen  DISPOSITION OF SPECIMEN:  N/A  COUNTS:  YES  TOURNIQUET:  * No tourniquets in log *  DICTATION: .Dragon Dictation Details of the procedure:   After the patient was consented patient was taken back to the operating room patient was then placed in supine position bilateral SCDs in place. After antibiotics were confirmed a timeout was called and all facts were verified. The Veress needle technique was used to insuflate the abdomen at Palmer's point. The abdomen was insufflated to 14 mm mercury. Subsequently a 5 mm trocar was placed a camera inserted there was no injury to any intra-abdominal organs.  There was a large amount of ascities seen in theh abdomen.  This was irragated out and measured about 2L. There was seen to be a 1cm hernia.  A second camera port was in placed into the left lower quadrant.    A 86mm port was placed in the right upper quadrant. I proceeded to reduce the hernia contents which consisted of preperitoneal mesh.  Once the hernia was cleared away, a Bard Ventralight 11.4cm  mesh was inserted into the abdomen.  The mesh was secured circumferentially with am Securestrap tacker in a double crown fashion. The falciform ligament was then brought up to the abdominal wall and secured with the Securestrap. The was a some oozing from a secure strap site.   A 2-0 prolene was used to ligate this transfascially.  The bleed stopped.  The  omentum was brought over the area of the mesh. The pneumoperitoneum was evacuated  & all trocars  were removed. The skin was reapproximated with 4-0  Monocryl sutures in a subcuticular fashion. The skin was dressed with Steri-Strips tape and gauze.  The patient was taken to the recovery room in stable condition.    PLAN OF CARE: Admit for overnight observation  PATIENT DISPOSITION:  PACU - hemodynamically stable.   Delay start of Pharmacological VTE agent (>24hrs) due to surgical blood loss or risk of bleeding: not applicable

## 2014-03-21 NOTE — Anesthesia Postprocedure Evaluation (Signed)
  Anesthesia Post-op Note  Patient: SOVANN BATRA  Procedure(s) Performed: Procedure(s): LAPAROSCOPIC UMBILICAL HERNIA REPAIR WITH MESH (N/A) INSERTION OF MESH (N/A)  Patient Location: PACU  Anesthesia Type:General  Level of Consciousness: awake, alert  and oriented  Airway and Oxygen Therapy: Patient Spontanous Breathing and Patient connected to nasal cannula oxygen  Post-op Pain: mild  Post-op Assessment: Post-op Vital signs reviewed, Patient's Cardiovascular Status Stable, Respiratory Function Stable, Patent Airway and Pain level controlled  Post-op Vital Signs: stable  Last Vitals:  Filed Vitals:   03/21/14 1715  BP: 123/90  Pulse: 64  Temp:   Resp: 29    Complications: No apparent anesthesia complications

## 2014-03-21 NOTE — Progress Notes (Signed)
Noted golf ball sized raised area to LLQ  of patient's abdomen during bedside report with  Fredia Sorrow nurse.  Trevor [name]  stated that raised area was not noted before transporting patient to room 6N18.  The raised area to LLQ is soft to touch and patient voices no pain to area. Notified on call Dr. Corliss Skains. Dr. Corliss Skains stated to let Dr. Derrell Lolling know in the morning.

## 2014-03-22 ENCOUNTER — Encounter (HOSPITAL_COMMUNITY): Payer: Self-pay | Admitting: General Practice

## 2014-03-22 DIAGNOSIS — N2581 Secondary hyperparathyroidism of renal origin: Secondary | ICD-10-CM | POA: Diagnosis not present

## 2014-03-22 DIAGNOSIS — I120 Hypertensive chronic kidney disease with stage 5 chronic kidney disease or end stage renal disease: Secondary | ICD-10-CM | POA: Diagnosis not present

## 2014-03-22 DIAGNOSIS — K429 Umbilical hernia without obstruction or gangrene: Secondary | ICD-10-CM | POA: Diagnosis not present

## 2014-03-22 DIAGNOSIS — N186 End stage renal disease: Secondary | ICD-10-CM | POA: Diagnosis not present

## 2014-03-22 DIAGNOSIS — D631 Anemia in chronic kidney disease: Secondary | ICD-10-CM | POA: Diagnosis not present

## 2014-03-22 LAB — RENAL FUNCTION PANEL
Albumin: 2.8 g/dL — ABNORMAL LOW (ref 3.5–5.2)
Anion gap: 20 — ABNORMAL HIGH (ref 5–15)
BUN: 49 mg/dL — ABNORMAL HIGH (ref 6–23)
CALCIUM: 8.7 mg/dL (ref 8.4–10.5)
CHLORIDE: 93 meq/L — AB (ref 96–112)
CO2: 23 meq/L (ref 19–32)
CREATININE: 7.54 mg/dL — AB (ref 0.50–1.35)
GFR calc Af Amer: 8 mL/min — ABNORMAL LOW (ref >90)
GFR, EST NON AFRICAN AMERICAN: 7 mL/min — AB (ref >90)
GLUCOSE: 83 mg/dL (ref 70–99)
Phosphorus: 6 mg/dL — ABNORMAL HIGH (ref 2.3–4.6)
Potassium: 6.3 mEq/L — ABNORMAL HIGH (ref 3.7–5.3)
Sodium: 136 mEq/L — ABNORMAL LOW (ref 137–147)

## 2014-03-22 LAB — CBC
HEMATOCRIT: 34.7 % — AB (ref 39.0–52.0)
HEMOGLOBIN: 12.4 g/dL — AB (ref 13.0–17.0)
MCH: 29.5 pg (ref 26.0–34.0)
MCHC: 35.7 g/dL (ref 30.0–36.0)
MCV: 82.4 fL (ref 78.0–100.0)
Platelets: 127 10*3/uL — ABNORMAL LOW (ref 150–400)
RBC: 4.21 MIL/uL — AB (ref 4.22–5.81)
RDW: 19.4 % — ABNORMAL HIGH (ref 11.5–15.5)
WBC: 8.3 10*3/uL (ref 4.0–10.5)

## 2014-03-22 MED ORDER — DIPHENHYDRAMINE HCL 50 MG/ML IJ SOLN
INTRAMUSCULAR | Status: AC
  Filled 2014-03-22: qty 1

## 2014-03-22 MED ORDER — CAMPHOR-MENTHOL 0.5-0.5 % EX LOTN
TOPICAL_LOTION | CUTANEOUS | Status: DC | PRN
  Filled 2014-03-22: qty 222

## 2014-03-22 MED ORDER — HYDROXYZINE HCL 25 MG PO TABS
ORAL_TABLET | ORAL | Status: AC
  Filled 2014-03-22: qty 1

## 2014-03-22 MED ORDER — OXYCODONE-ACETAMINOPHEN 5-325 MG PO TABS: 1.0000 | ORAL_TABLET | ORAL | Status: DC | PRN

## 2014-03-22 MED ORDER — DOXERCALCIFEROL 4 MCG/2ML IV SOLN
2.0000 ug | INTRAVENOUS | Status: DC
  Filled 2014-03-22: qty 2

## 2014-03-22 MED ORDER — RENA-VITE PO TABS
1.0000 | ORAL_TABLET | Freq: Every day | ORAL | Status: DC
  Administered 2014-03-23: 1 via ORAL
  Filled 2014-03-22 (×2): qty 1

## 2014-03-22 MED ORDER — HYDROXYZINE HCL 25 MG PO TABS
25.0000 mg | ORAL_TABLET | Freq: Three times a day (TID) | ORAL | Status: DC | PRN
  Administered 2014-03-22 (×2): 25 mg via ORAL
  Filled 2014-03-22 (×2): qty 1

## 2014-03-22 MED ORDER — DIPHENHYDRAMINE HCL 25 MG PO CAPS
ORAL_CAPSULE | ORAL | Status: AC
  Filled 2014-03-22: qty 1

## 2014-03-22 MED ORDER — SODIUM CHLORIDE 0.9 % IV SOLN
62.5000 mg | INTRAVENOUS | Status: DC
  Filled 2014-03-22: qty 5

## 2014-03-22 MED ORDER — OXYCODONE HCL 5 MG PO TABS
ORAL_TABLET | ORAL | Status: AC
  Filled 2014-03-22: qty 1

## 2014-03-22 NOTE — Progress Notes (Signed)
1 Day Post-Op  Subjective: Pt doing well.  Awaiting HD  Objective: Vital signs in last 24 hours: Temp:  [97.3 F (36.3 C)-98.6 F (37 C)] 98 F (36.7 C) (07/15 0947) Pulse Rate:  [64-113] 97 (07/15 0947) Resp:  [20-29] 20 (07/15 0947) BP: (105-133)/(75-106) 105/77 mmHg (07/15 0947) SpO2:  [92 %-96 %] 95 % (07/15 0947) Weight:  [222 lb (100.699 kg)] 222 lb (100.699 kg) (07/14 1325) Last BM Date: 03/20/14  Intake/Output from previous day: 07/14 0701 - 07/15 0700 In: 586 [P.O.:236; I.V.:350] Out: -  Intake/Output this shift: Total I/O In: 360 [P.O.:360] Out: -   General appearance: alert and cooperative Cardio: regular rate and rhythm, S1, S2 normal, no murmur, click, rub or gallop GI: s/approp ttp, ND wound c/d/i  Lab Results:   Recent Labs  03/21/14 1329 03/21/14 1334  HGB  --  14.3  HCT  --  42.0  PLT 76*  --    BMET  Recent Labs  03/21/14 1334  NA 139  K 4.4  GLUCOSE 77   PT/INR No results found for this basename: LABPROT, INR,  in the last 72 hours ABG No results found for this basename: PHART, PCO2, PO2, HCO3,  in the last 72 hours  Studies/Results: No results found.  Anti-infectives: Anti-infectives   Start     Dose/Rate Route Frequency Ordered Stop   03/21/14 0600  ceFAZolin (ANCEF) IVPB 2 g/50 mL premix     2 g 100 mL/hr over 30 Minutes Intravenous On call to O.R. 03/20/14 1416 03/21/14 1530      Assessment/Plan: s/p Procedure(s): LAPAROSCOPIC UMBILICAL HERNIA REPAIR WITH MESH (N/A) INSERTION OF MESH (N/A) Awaiting HD Will plan on DC;ing after HD F/U x 2 weeks   LOS: 1 day    Marigene Ehlers., [facility] 03/22/2014

## 2014-03-22 NOTE — Consult Note (Signed)
Indication for Consultation:  Management of ESRD/hemodialysis; anemia, hypertension/volume and secondary hyperparathyroidism  HPI: Trevor [name] is a 63 y.o. male admitted last night after lap umbilical hernia repair. He recieves HD MWF at [facility]. History of HTN, afib rate controlled, PVD. This was an elective surgery, he stayed overnight due to past history of complication with anesthesia. He had otherwise been feeling well. Will schedule for HD today to keep on schedule.  Past Medical History  Diagnosis Date  . Anemia   . AVF (arteriovenous fistula)     Left  . Secondary hyperparathyroidism   . Hypovitaminosis D   . Hypertensive urgency     H/o  . CHF (congestive heart failure)   . Exertional shortness of breath     "related to infection in my lungs right now" (05/03/2013)  . History of gout     "before I started doing the dialysis" (05/03/2013)  . Sleep apnea   . GERD (gastroesophageal reflux disease)   . Syncope     felt secondary to residual anesthesia the day before - 2D echo unremarkable  . Pulmonary embolism     with right DVT secondary to recent surgery  . Atrial fibrillation     not on coumadin due to large spontaneous hematoma on back and anemia  . Myocardial infarction 90's  . Coronary artery disease   . Dysrhythmia     afib  . Peripheral vascular disease     dvt leg 12/14  . Hypertension   . ESRD (end stage renal disease) on dialysis     [facility] mon/wed/fri   Past Surgical History  Procedure Laterality Date  . Av fistula placement Left     Dr. Charlean Sanfilippo; "I've had 2 on the left' (05/03/2013)  . Av fistula placement Right ~ 2011  . Knee arthroscopy Left   . Avgg removal Right 05/04/2013    Procedure: REMOVAL OF ARTERIOVENOUS Fistula Right Arm;  Surgeon: [name], MD;  Location: [facility] OR;  Service: Vascular;  Laterality: Right;  . Insertion of dialysis catheter Right 05/04/2013    Procedure: INSERTION OF DIALYSIS CATHETER;  Surgeon: [name], MD;   Location: [facility] OR;  Service: Vascular;  Laterality: Right;  . Colonoscopy      Hx: of  . Bascilic vein transposition Left 06/27/2013    Procedure: BASCILIC VEIN TRANSPOSITION;  Surgeon: [name], MD;  Location: [facility] OR;  Service: Vascular;  Laterality: Left;  . Cardioversion N/A 08/29/2013    Procedure: CARDIOVERSION;  Surgeon: [name], MD;  Location: [facility] ENDOSCOPY;  Service: Cardiovascular;  Laterality: N/A;  . Removal of a dialysis catheter  2/15  . Hernia repair  03/21/14    Umbilical hernia-Dr. Derrell Lolling   Family History  Problem Relation Age of Onset  . Hypertension Father   . Kidney disease Father   . Allergies Father   . Deep vein thrombosis Sister   . Pulmonary embolism Sister   . Diabetes Paternal Grandmother    Social History:  reports that he quit smoking about 41 years ago. His smoking use included Cigarettes. He has a .125 pack-year smoking history. He has never used smokeless tobacco. He reports that he drinks alcohol. He reports that he does not use illicit drugs. Allergies  Allergen Reactions  . Pork-Derived Products Nausea And Vomiting    Culture   Prior to Admission medications   Medication Sig Start Date End Date Taking? Authorizing Provider  amLODipine (NORVASC) 10 MG tablet Take 10 mg  by mouth daily.     Yes Historical Provider, MD  labetalol (NORMODYNE) 200 MG tablet Take 200 mg by mouth 2 (two) times daily.   Yes Historical Provider, MD  sevelamer carbonate (RENVELA) 800 MG tablet Take 2,400-3,200 mg by mouth See admin instructions. 3200mg  with each meal, 2400-3200mg  with each snack   Yes Historical Provider, MD  vitamin C (ASCORBIC ACID) 500 MG tablet Take 500-1,500 mg by mouth daily.   Yes Historical Provider, MD  aspirin EC 81 MG tablet Take 1 tablet (81 mg total) by mouth daily. 09/06/13   Penny Pia, MD   Current Facility-Administered Medications  Medication Dose Route Frequency Provider Last Rate Last Dose  . amLODipine (NORVASC) tablet 10 mg   10 mg Oral Daily Axel Filler, MD   10 mg at 03/21/14 2018  . diphenhydrAMINE (BENADRYL) injection 25 mg  25 mg Intravenous Q6H PRN Wilmon Arms. Tsuei, MD   25 mg at 03/22/14 0809  . hydrOXYzine (ATARAX/VISTARIL) tablet 25 mg  25 mg Oral TID PRN Derrill Kay, NP      . labetalol (NORMODYNE) tablet 200 mg  200 mg Oral BID Axel Filler, MD   200 mg at 03/21/14 2303  . oxyCODONE (Oxy IR/ROXICODONE) immediate release tablet 5 mg  5 mg Oral Q4H PRN Axel Filler, MD   5 mg at 03/22/14 0511  . sevelamer carbonate (RENVELA) tablet 2,400 mg  2,400 mg Oral TID BM PRN Axel Filler, MD      . sevelamer carbonate (RENVELA) tablet 3,200 mg  3,200 mg Oral TID WC Axel Filler, MD      . vitamin C (ASCORBIC ACID) tablet 500 mg  500 mg Oral Daily Axel Filler, MD   500 mg at 03/21/14 2018   Labs: Basic Metabolic Panel:  Recent Labs Lab 03/21/14 1334  NA 139  K 4.4  GLUCOSE 77   Liver Function Tests: No results found for this basename: AST, ALT, ALKPHOS, BILITOT, PROT, ALBUMIN,  in the last 168 hours No results found for this basename: LIPASE, AMYLASE,  in the last 168 hours No results found for this basename: AMMONIA,  in the last 168 hours CBC:  Recent Labs Lab 03/21/14 1329 03/21/14 1334  HGB  --  14.3  HCT  --  42.0  PLT 76*  --    Cardiac Enzymes: No results found for this basename: CKTOTAL, CKMB, CKMBINDEX, TROPONINI,  in the last 168 hours CBG: No results found for this basename: GLUCAP,  in the last 168 hours Iron Studies: No results found for this basename: IRON, TIBC, TRANSFERRIN, FERRITIN,  in the last 72 hours Studies/Results: No results found.   Review of Systems: Negative except for abdominal pain and itching.  Physical Exam: Filed Vitals:   03/21/14 1817 03/21/14 2134 03/22/14 0149 03/22/14 0536  BP: 122/91 118/75 132/90 131/92  Pulse: 75 95 110 113  Temp: 98.1 F (36.7 C) 97.3 F (36.3 C) 98.6 F (37 C) 98.1 F (36.7 C)  TempSrc: Oral Oral  Oral Oral  Resp: 24 21 24 24   Height:      Weight:      SpO2: 93% 93% 96% 93%     General: Drowsy, easily arousable. Well developed, well nourished, in no acute distress.  Head: Normocephalic, atraumatic, sclera non-icteric, mucus membranes are moist Neck: Supple. JVD not elevated. Lungs: Clear bilaterally to auscultation without wheezes, rales, or rhonchi. Breathing is unlabored. Heart: Tachy. RRR with S1 S2. No murmurs, rubs, or gallops appreciated. Abdomen: Soft,  distended, tender. normoactive bowel sounds. No obvious abdominal masses. M-S:  Strength and tone appear normal for age. Lower extremities:without edema or ischemic changes, no open wounds  Neuro: Alert and oriented X 3. Moves all extremities spontaneously. Psych:  Responds to questions appropriately with a normal affect. Dialysis Access: L AVF +bruit/thrill  Dialysis Orders:  MWF @ [facility]. 4:45   99.5kg  200   2K/2Ca   500/800 L AVF 3200 Heparin     hectorol 2 mcg IV/HD Epogen- none Venofer 50 q week  Assessment/Plan:                                            1. Umbilical hernia repair- POD 1 by Dr Derrell Lollingamirez 2. ESRD -  MWF @ [facility], HD today. K+4.4 3. Hypertension/volume  - 131/91 norvasc and labetalol 4. Anemia  - hgb 14.3 no outpt ESA, cont weekly Fe.  5. Metabolic bone disease -  Ca+ 9.1 (7/7) hectorol, renvela w meals 6. Nutrition - renal diet, multivit 7. Afib- ASA, no coumadin. Rate controlled on labetalol  Jetty DuhamelBridget Whelan, NP [facility] [phone] 03/22/2014, 9:04 AM   Pt seen, examined and agree w A/P as above.  Vinson Moselleob Eloyse Causey MD pager [phone]    cell [phone] 03/22/2014, 5:10 PM

## 2014-03-23 ENCOUNTER — Telehealth (INDEPENDENT_AMBULATORY_CARE_PROVIDER_SITE_OTHER): Payer: Self-pay

## 2014-03-23 DIAGNOSIS — I120 Hypertensive chronic kidney disease with stage 5 chronic kidney disease or end stage renal disease: Secondary | ICD-10-CM | POA: Diagnosis not present

## 2014-03-23 DIAGNOSIS — N2581 Secondary hyperparathyroidism of renal origin: Secondary | ICD-10-CM | POA: Diagnosis not present

## 2014-03-23 DIAGNOSIS — N186 End stage renal disease: Secondary | ICD-10-CM | POA: Diagnosis not present

## 2014-03-23 DIAGNOSIS — K429 Umbilical hernia without obstruction or gangrene: Secondary | ICD-10-CM | POA: Diagnosis not present

## 2014-03-23 DIAGNOSIS — D631 Anemia in chronic kidney disease: Secondary | ICD-10-CM | POA: Diagnosis not present

## 2014-03-23 LAB — HEPATITIS B SURFACE ANTIBODY,QUALITATIVE: HEP B S AB: NEGATIVE

## 2014-03-23 NOTE — Progress Notes (Signed)
Subjective:   Feeling well. No complaints. Going home today  Objective Filed Vitals:   03/22/14 2129 03/23/14 0015 03/23/14 0124 03/23/14 0522  BP: 98/69 108/70 99/74 104/75  Pulse: 107 100 131 109  Temp: 97.8 F (36.6 C)  99.2 F (37.3 C) 98 F (36.7 C)  TempSrc: Oral  Oral Oral  Resp: 21  21 20   Height:      Weight:    98.3 kg (216 lb 11.4 oz)  SpO2: 92%  90% 91%   Physical Exam General: alert and oriented Heart: RRR Lungs: CTA, unlabored Abdomen: soft, nontender, dressings intact Extremities: no edema Dialysis Access: L AVF +bruit  Dialysis Orders: MWF @ [facility].  4:45 99.5kg 200 2K/2Ca 500/800 L AVF 3200 Heparin  hectorol 2 mcg IV/HD Epogen- none Venofer 50 q week   Assessment/Plan:  1. Umbilical hernia repair- 7/14 POD 2 by Dr Derrell Lolling. Pain controlled 2. ESRD - MWF @ [facility], HD tomorrow out pt 3. Hypertension/volume - 104/75 norvasc and labetalol 4. Anemia - hgb 12.4 no outpt ESA, cont weekly Fe.  5. Metabolic bone disease - Ca+ 8.7, phos 6. cont hectorol, renvela w meals 6. Nutrition - renal diet, multivit. Alb 2.8 7. Afib- ASA, no coumadin. Rate controlled on labetalol 8. Dispo - DC today  Jetty Duhamel, NP [facility] Beeper [phone] 03/23/2014,10:54 AM  LOS: 2 days   Pt seen, examined and agree w A/P as above.  [name] MD pager [phone]    cell [phone] 03/23/2014, 1:24 PM    Additional Objective Labs: Basic Metabolic Panel:  Recent Labs Lab 03/21/14 1334 03/22/14 1402  NA 139 136*  K 4.4 6.3*  CL  --  93*  CO2  --  23  GLUCOSE 77 83  BUN  --  49*  CREATININE  --  7.54*  CALCIUM  --  8.7  PHOS  --  6.0*   Liver Function Tests:  Recent Labs Lab 03/22/14 1402  ALBUMIN 2.8*   No results found for this basename: LIPASE, AMYLASE,  in the last 168 hours CBC:  Recent Labs Lab 03/21/14 1329 03/21/14 1334 03/22/14 1402  WBC  --   --  8.3  HGB  --  14.3 12.4*  HCT  --  42.0 34.7*  MCV  --   --  82.4  PLT 76*  --   127*   Blood Culture    Component Value Date/Time   SDES BLOOD LEFT HAND 09/01/2013 0210   SPECREQUEST BOTTLES DRAWN AEROBIC ONLY 10CC 09/01/2013 0210   CULT  Value: NO GROWTH 5 DAYS Performed at [facility] 09/01/2013 0210   REPTSTATUS 09/07/2013 FINAL 09/01/2013 0210    Cardiac Enzymes: No results found for this basename: CKTOTAL, CKMB, CKMBINDEX, TROPONINI,  in the last 168 hours CBG: No results found for this basename: GLUCAP,  in the last 168 hours Iron Studies: No results found for this basename: IRON, TIBC, TRANSFERRIN, FERRITIN,  in the last 72 hours @lablastinr3 @ Studies/Results: No results found. Medications:   . amLODipine  10 mg Oral Daily  . doxercalciferol  2 mcg Intravenous Q M,W,F-HD  . ferric gluconate (FERRLECIT/NULECIT) IV  62.5 mg Intravenous Weekly  . labetalol  200 mg Oral BID  . multivitamin  1 tablet Oral QHS  . sevelamer carbonate  3,200 mg Oral TID WC  . vitamin C  500 mg Oral Daily

## 2014-03-23 NOTE — Telephone Encounter (Signed)
[facility] Calling pt to see how he was doing after sx. Also to notify pt of post op appt scheduled for 04/13/14 arrive @ 11:45.

## 2014-03-23 NOTE — Discharge Summary (Signed)
Physician Discharge Summary  Patient ID: Trevor [name] MRN: [id number] DOB/AGE: July 03, 1951 63 y.o.  Admit date: 03/21/2014 Discharge date: 03/23/2014  Admission Diagnoses: S/P vhr  Discharge Diagnoses:  Active Problems:   S/P repair of ventral hernia   Discharged Condition: good  Hospital Course: Pt was admitted post op.  Nephrology was consulted for HD.  He underwent HD POD #1. He tolerated that well.  He was o/w ambulating well adn tol PO.  He had good pain control was deemed stable for DC and Dc'd home.  Consults: nephrology  Significant Diagnostic Studies: none  Treatments: surgery: as above  Discharge Exam: Blood pressure 104/75, pulse 109, temperature 98 F (36.7 C), temperature source Oral, resp. rate 20, height 6' 1.5" (1.867 m), weight 216 lb 11.4 oz (98.3 kg), SpO2 91.00%. General appearance: alert and cooperative Cardio: regular rate and rhythm, S1, S2 normal, no murmur, click, rub or gallop GI: soft, non-tender; bowel sounds normal; no masses,  no organomegaly Incision/Wound: c/d/i  Disposition: 01-Home or Self Care  Discharge Instructions   Diet - low sodium heart healthy    Complete by:  As directed      Increase activity slowly    Complete by:  As directed             Medication List         amLODipine 10 MG tablet  Commonly known as:  NORVASC  Take 10 mg by mouth daily.     aspirin EC 81 MG tablet  Take 1 tablet (81 mg total) by mouth daily.     labetalol 200 MG tablet  Commonly known as:  NORMODYNE  Take 200 mg by mouth 2 (two) times daily.     oxyCODONE-acetaminophen 5-325 MG per tablet  Commonly known as:  ROXICET  Take 1-2 tablets by mouth every 4 (four) hours as needed.     sevelamer carbonate 800 MG tablet  Commonly known as:  RENVELA  Take 2,400-3,200 mg by mouth See admin instructions. 3200mg  with each meal, 2400-3200mg  with each snack     vitamin C 500 MG tablet  Commonly known as:  ASCORBIC ACID  Take 500-1,500 mg by mouth  daily.           Follow-up Information   Follow up with Lajean Saver, MD. Schedule an appointment as soon as possible for a visit in 2 weeks. (For wound re-check)    Specialty:  General Surgery   Contact information:   [address] [place] [address] [phone]       Signed: Marigene Ehlers., Jed Limerick 03/23/2014, 8:47 AM

## 2014-03-24 ENCOUNTER — Encounter (HOSPITAL_COMMUNITY): Payer: Self-pay | Admitting: General Surgery

## 2014-03-26 ENCOUNTER — Encounter (HOSPITAL_COMMUNITY): Payer: Self-pay | Admitting: Emergency Medicine

## 2014-03-26 ENCOUNTER — Emergency Department (HOSPITAL_COMMUNITY)
Admission: EM | Admit: 2014-03-26 | Discharge: 2014-03-26 | Disposition: A | Discharge status: Home / Self Care | Payer: Medicare Other | Attending: Emergency Medicine | Admitting: Emergency Medicine

## 2014-03-26 ENCOUNTER — Emergency Department (HOSPITAL_COMMUNITY): Payer: Medicare Other

## 2014-03-26 ENCOUNTER — Telehealth (INDEPENDENT_AMBULATORY_CARE_PROVIDER_SITE_OTHER): Payer: Self-pay | Admitting: General Surgery

## 2014-03-26 DIAGNOSIS — I252 Old myocardial infarction: Secondary | ICD-10-CM | POA: Insufficient documentation

## 2014-03-26 DIAGNOSIS — Z992 Dependence on renal dialysis: Secondary | ICD-10-CM | POA: Insufficient documentation

## 2014-03-26 DIAGNOSIS — G8918 Other acute postprocedural pain: Secondary | ICD-10-CM | POA: Insufficient documentation

## 2014-03-26 DIAGNOSIS — Z7982 Long term (current) use of aspirin: Secondary | ICD-10-CM | POA: Diagnosis not present

## 2014-03-26 DIAGNOSIS — I120 Hypertensive chronic kidney disease with stage 5 chronic kidney disease or end stage renal disease: Secondary | ICD-10-CM | POA: Diagnosis not present

## 2014-03-26 DIAGNOSIS — Z86711 Personal history of pulmonary embolism: Secondary | ICD-10-CM | POA: Insufficient documentation

## 2014-03-26 DIAGNOSIS — Z8719 Personal history of other diseases of the digestive system: Secondary | ICD-10-CM | POA: Diagnosis not present

## 2014-03-26 DIAGNOSIS — Z8639 Personal history of other endocrine, nutritional and metabolic disease: Secondary | ICD-10-CM | POA: Insufficient documentation

## 2014-03-26 DIAGNOSIS — N186 End stage renal disease: Secondary | ICD-10-CM | POA: Insufficient documentation

## 2014-03-26 DIAGNOSIS — Z79899 Other long term (current) drug therapy: Secondary | ICD-10-CM | POA: Insufficient documentation

## 2014-03-26 DIAGNOSIS — K429 Umbilical hernia without obstruction or gangrene: Secondary | ICD-10-CM | POA: Diagnosis not present

## 2014-03-26 DIAGNOSIS — Z87891 Personal history of nicotine dependence: Secondary | ICD-10-CM | POA: Insufficient documentation

## 2014-03-26 DIAGNOSIS — Z862 Personal history of diseases of the blood and blood-forming organs and certain disorders involving the immune mechanism: Secondary | ICD-10-CM | POA: Insufficient documentation

## 2014-03-26 DIAGNOSIS — Y849 Medical procedure, unspecified as the cause of abnormal reaction of the patient, or of later complication, without mention of misadventure at the time of the procedure: Secondary | ICD-10-CM | POA: Diagnosis not present

## 2014-03-26 DIAGNOSIS — R1084 Generalized abdominal pain: Secondary | ICD-10-CM | POA: Diagnosis not present

## 2014-03-26 DIAGNOSIS — I509 Heart failure, unspecified: Secondary | ICD-10-CM | POA: Insufficient documentation

## 2014-03-26 DIAGNOSIS — R109 Unspecified abdominal pain: Secondary | ICD-10-CM | POA: Diagnosis not present

## 2014-03-26 DIAGNOSIS — N269 Renal sclerosis, unspecified: Secondary | ICD-10-CM | POA: Diagnosis not present

## 2014-03-26 DIAGNOSIS — Z9889 Other specified postprocedural states: Secondary | ICD-10-CM | POA: Insufficient documentation

## 2014-03-26 DIAGNOSIS — T8131XA Disruption of external operation (surgical) wound, not elsewhere classified, initial encounter: Secondary | ICD-10-CM | POA: Insufficient documentation

## 2014-03-26 DIAGNOSIS — I10 Essential (primary) hypertension: Secondary | ICD-10-CM | POA: Diagnosis not present

## 2014-03-26 DIAGNOSIS — I2510 Atherosclerotic heart disease of native coronary artery without angina pectoris: Secondary | ICD-10-CM | POA: Diagnosis not present

## 2014-03-26 LAB — CBC WITH DIFFERENTIAL/PLATELET
BASOS ABS: 0 10*3/uL (ref 0.0–0.1)
Basophils Relative: 0 % (ref 0–1)
EOS PCT: 4 % (ref 0–5)
Eosinophils Absolute: 0.2 10*3/uL (ref 0.0–0.7)
HEMATOCRIT: 33.9 % — AB (ref 39.0–52.0)
Hemoglobin: 12 g/dL — ABNORMAL LOW (ref 13.0–17.0)
LYMPHS ABS: 0.3 10*3/uL — AB (ref 0.7–4.0)
LYMPHS PCT: 7 % — AB (ref 12–46)
MCH: 28.7 pg (ref 26.0–34.0)
MCHC: 35.4 g/dL (ref 30.0–36.0)
MCV: 81.1 fL (ref 78.0–100.0)
MONO ABS: 0.7 10*3/uL (ref 0.1–1.0)
Monocytes Relative: 17 % — ABNORMAL HIGH (ref 3–12)
NEUTROS ABS: 2.9 10*3/uL (ref 1.7–7.7)
Neutrophils Relative %: 72 % (ref 43–77)
PLATELETS: 125 10*3/uL — AB (ref 150–400)
RBC: 4.18 MIL/uL — AB (ref 4.22–5.81)
RDW: 19.2 % — AB (ref 11.5–15.5)
WBC: 4 10*3/uL (ref 4.0–10.5)

## 2014-03-26 LAB — COMPREHENSIVE METABOLIC PANEL
AST: 30 U/L (ref 0–37)
Albumin: 2.5 g/dL — ABNORMAL LOW (ref 3.5–5.2)
Alkaline Phosphatase: 169 U/L — ABNORMAL HIGH (ref 39–117)
Anion gap: 18 — ABNORMAL HIGH (ref 5–15)
BILIRUBIN TOTAL: 3.1 mg/dL — AB (ref 0.3–1.2)
BUN: 52 mg/dL — ABNORMAL HIGH (ref 6–23)
CALCIUM: 8.8 mg/dL (ref 8.4–10.5)
CHLORIDE: 92 meq/L — AB (ref 96–112)
CO2: 26 mEq/L (ref 19–32)
Creatinine, Ser: 6.55 mg/dL — ABNORMAL HIGH (ref 0.50–1.35)
GFR, EST AFRICAN AMERICAN: 9 mL/min — AB (ref >90)
GFR, EST NON AFRICAN AMERICAN: 8 mL/min — AB (ref >90)
GLUCOSE: 94 mg/dL (ref 70–99)
Potassium: 4.9 mEq/L (ref 3.7–5.3)
SODIUM: 136 meq/L — AB (ref 137–147)
Total Protein: 6.9 g/dL (ref 6.0–8.3)

## 2014-03-26 LAB — I-STAT CG4 LACTIC ACID, ED: Lactic Acid, Venous: 1.26 mmol/L (ref 0.5–2.2)

## 2014-03-26 MED ORDER — ONDANSETRON HCL 4 MG/2ML IJ SOLN
4.0000 mg | Freq: Once | INTRAMUSCULAR | Status: AC
  Administered 2014-03-26: 4 mg via INTRAVENOUS
  Filled 2014-03-26: qty 2

## 2014-03-26 MED ORDER — IOHEXOL 300 MG/ML  SOLN
50.0000 mL | Freq: Once | INTRAMUSCULAR | Status: AC | PRN
  Administered 2014-03-26: 50 mL via ORAL

## 2014-03-26 MED ORDER — HYDROMORPHONE HCL PF 1 MG/ML IJ SOLN
1.0000 mg | Freq: Once | INTRAMUSCULAR | Status: AC
  Administered 2014-03-26: 1 mg via INTRAVENOUS
  Filled 2014-03-26: qty 1

## 2014-03-26 NOTE — ED Notes (Signed)
Pt due for his CT scan at 1430 pm.

## 2014-03-26 NOTE — Consult Note (Signed)
Spontaneous drainage from Rt mid abdomen trocar site, abd pain on right side. No f/c/n/v. +BM yesterday.   Alert, nontoxic, nad abd - obese, all trocar sites closed except small skin separation in rt mid abd trocar site. Not overtly tender. No cellulitis. approp TTP for having recent lap VHR  Ct and labs reviewed Believe he has small seroma ant to mesh in old hernia sac - not c/w with bowel to me  From surgical standpoint -  -I dermabonded the trocar site to prevent drainage. Pt has known ascites.  -needs to wear abdominal binder for 30 days to decrease risk of seroma -no sign of infection. No sign of obstruction.  -believe pain is typical for lap ventral hernia repair -keep scheduled follow up with Dr Derrell Lolling -stable for discharge with respect to hernia repair  -will defer to EDP whether or not pt needs to be admitted for dialysis, etc -discussed with EDP  Mary Sella. Andrey Campanile, MD, FACS General, Bariatric, & Minimally Invasive Surgery [facility], [place]

## 2014-03-26 NOTE — ED Notes (Signed)
Pt drinking ct contrast 

## 2014-03-26 NOTE — ED Notes (Signed)
Returned from ct scan 

## 2014-03-26 NOTE — Consult Note (Signed)
Reason for Consult:  Drainage and abdominal pain s/p Umbilical hernia 12/16/71, Dr. Rulon Sera Referring Physician: Dr. Ernestina Patches (ED)  Trevor [name] is an 63 y.o. male.  HPI: Pt is on HD and had an umbilical hernia repair with 11.4 cm Bard Ventralight Mesh on 03/21/14.  He presents with pain that he says feels like appendicitis, and new drainage.  He says it started out clear, but has gotten to be serosanguinous with increasing amounts of fluid coming from the site.  He presents to ED for evaluation.  He appears dyspneic, he has JVD, and he has significant discomfort when moving.  Significant drainage from the now open RUQ port site from his hernia repair.  Work up in the ED, shows one temperature of 99.9, HR currently over 100 at rest, creatinine of 6.55, K+ 4.9, WBC of 4.0 H/H is stable, lactic acid is 1.26.CT scan shows:  Body wall edema diffusely throughout the subcutaneous tissues of the lower chest, abdomen and pelvis. Umbilical hernia which appears to contain a normal-appearing loop of fluid-filled small bowel, though this portion of the bowel is not filled with contrast and this may just represent fluid within the hernia sac. No contained fluid collections within the subcutaneous tissues or within the abdomen or pelvis to suggest abscess. It is Dr. Dois Davenport opinion that there is a seroma in the hernia sac.  We are ask to see.  Last HD 03/24/14, he reports being under dry weight.  Past Medical History  Diagnosis Date  . Anemia   . AVF (arteriovenous fistula)     Left  . Secondary hyperparathyroidism   . Hypovitaminosis D   . Hypertensive urgency     H/o  . CHF (congestive heart failure)   . Exertional shortness of breath     "related to infection in my lungs right now" (05/03/2013)  . History of gout     "before I started doing the dialysis" (05/03/2013)  . Sleep apnea   . GERD (gastroesophageal reflux disease)   . Syncope     felt secondary to residual anesthesia the day before - 2D  echo unremarkable  . Pulmonary embolism     with right DVT secondary to recent surgery  . Atrial fibrillation     not on coumadin due to large spontaneous hematoma on back and anemia  . Myocardial infarction 90's  . Coronary artery disease   . Dysrhythmia     afib  . Peripheral vascular disease     dvt leg 12/14  . Hypertension   . ESRD (end stage renal disease) on dialysis     [facility] mon/wed/fri    Past Surgical History  Procedure Laterality Date  . Av fistula placement Left     Dr. Su Grand; "I've had 2 on the left' (05/03/2013)  . Av fistula placement Right ~ 2011  . Knee arthroscopy Left   . Avgg removal Right 05/04/2013    Procedure: REMOVAL OF ARTERIOVENOUS Fistula Right Arm;  Surgeon: Elam Dutch, MD;  Location: [place];  Service: Vascular;  Laterality: Right;  . Insertion of dialysis catheter Right 05/04/2013    Procedure: INSERTION OF DIALYSIS CATHETER;  Surgeon: Elam Dutch, MD;  Location: [place];  Service: Vascular;  Laterality: Right;  . Colonoscopy      Hx: of  . Bascilic vein transposition Left 06/27/2013    Procedure: BASCILIC VEIN TRANSPOSITION;  Surgeon: Elam Dutch, MD;  Location: [place];  Service: Vascular;  Laterality: Left;  .  Cardioversion N/A 08/29/2013    Procedure: CARDIOVERSION;  Surgeon: Sueanne Margarita, MD;  Location: [facility];  Service: Cardiovascular;  Laterality: N/A;  . Removal of a dialysis catheter  2/15  . Hernia repair  6/80/88    Umbilical hernia-Dr. Rosendo Gros  . Umbilical hernia repair N/A 03/21/2014    Procedure: LAPAROSCOPIC UMBILICAL HERNIA REPAIR WITH MESH;  Surgeon: Ralene Ok, MD;  Location: [place];  Service: General;  Laterality: N/A;  . Insertion of mesh N/A 03/21/2014    Procedure: INSERTION OF MESH;  Surgeon: Ralene Ok, MD;  Location: [place];  Service: General;  Laterality: N/A;    Family History  Problem Relation Age of Onset  . Hypertension Father   . Kidney disease Father   . Allergies Father   . Deep  vein thrombosis Sister   . Pulmonary embolism Sister   . Diabetes Paternal Grandmother     Social History:  reports that he quit smoking about 41 years ago. His smoking use included Cigarettes. He has a .125 pack-year smoking history. He has never used smokeless tobacco. He reports that he drinks alcohol. He reports that he does not use illicit drugs.  Allergies:  Allergies  Allergen Reactions  . Pork-Derived Products Nausea And Vomiting    Culture    Medications: Prior to Admission:  Scheduled: Continuous: PRN:   Prior to Admission medications   Medication Sig Start Date End Date Taking? Authorizing Provider  amLODipine (NORVASC) 10 MG tablet Take 10 mg by mouth daily.     Yes Historical Provider, MD  aspirin EC 81 MG tablet Take 1 tablet (81 mg total) by mouth daily. 09/06/13  Yes Velvet Bathe, MD  labetalol (NORMODYNE) 200 MG tablet Take 200 mg by mouth 2 (two) times daily.   Yes Historical Provider, MD  oxyCODONE-acetaminophen (PERCOCET/ROXICET) 5-325 MG per tablet Take 1 tablet by mouth every 4 (four) hours as needed for severe pain.   Yes Historical Provider, MD  sevelamer carbonate (RENVELA) 800 MG tablet Take 2,400-3,200 mg by mouth See admin instructions. 3272m with each meal, 2400-32048mwith each snack   Yes Historical Provider, MD  vitamin C (ASCORBIC ACID) 500 MG tablet Take 500-1,500 mg by mouth daily.   Yes Historical Provider, MD     Results for orders placed during the hospital encounter of 03/26/14 (from the past 48 hour(s))  CBC WITH DIFFERENTIAL     Status: Abnormal   Collection Time    03/26/14 11:50 AM      Result Value Ref Range   WBC 4.0  4.0 - 10.5 K/uL   RBC 4.18 (*) 4.22 - 5.81 MIL/uL   Hemoglobin 12.0 (*) 13.0 - 17.0 g/dL   HCT 33.9 (*) 39.0 - 52.0 %   MCV 81.1  78.0 - 100.0 fL   MCH 28.7  26.0 - 34.0 pg   MCHC 35.4  30.0 - 36.0 g/dL   RDW 19.2 (*) 11.5 - 15.5 %   Platelets 125 (*) 150 - 400 K/uL   Comment: PLATELET COUNT CONFIRMED BY SMEAR    Neutrophils Relative % 72  43 - 77 %   Neutro Abs 2.9  1.7 - 7.7 K/uL   Lymphocytes Relative 7 (*) 12 - 46 %   Lymphs Abs 0.3 (*) 0.7 - 4.0 K/uL   Monocytes Relative 17 (*) 3 - 12 %   Monocytes Absolute 0.7  0.1 - 1.0 K/uL   Eosinophils Relative 4  0 - 5 %   Eosinophils Absolute 0.2  0.0 - 0.7 K/uL   Basophils Relative 0  0 - 1 %   Basophils Absolute 0.0  0.0 - 0.1 K/uL  COMPREHENSIVE METABOLIC PANEL     Status: Abnormal   Collection Time    03/26/14 11:50 AM      Result Value Ref Range   Sodium 136 (*) 137 - 147 mEq/L   Potassium 4.9  3.7 - 5.3 mEq/L   Chloride 92 (*) 96 - 112 mEq/L   CO2 26  19 - 32 mEq/L   Glucose, Bld 94  70 - 99 mg/dL   BUN 52 (*) 6 - 23 mg/dL   Creatinine, Ser 6.55 (*) 0.50 - 1.35 mg/dL   Calcium 8.8  8.4 - 10.5 mg/dL   Total Protein 6.9  6.0 - 8.3 g/dL   Albumin 2.5 (*) 3.5 - 5.2 g/dL   AST 30  0 - 37 U/L   ALT <5  0 - 53 U/L   Alkaline Phosphatase 169 (*) 39 - 117 U/L   Total Bilirubin 3.1 (*) 0.3 - 1.2 mg/dL   GFR calc non Af Amer 8 (*) >90 mL/min   GFR calc Af Amer 9 (*) >90 mL/min   Comment: (NOTE)     The eGFR has been calculated using the CKD EPI equation.     This calculation has not been validated in all clinical situations.     eGFR's persistently <90 mL/min signify possible Chronic Kidney     Disease.   Anion gap 18 (*) 5 - 15  I-STAT CG4 LACTIC ACID, ED     Status: None   Collection Time    03/26/14 12:02 PM      Result Value Ref Range   Lactic Acid, Venous 1.26  0.5 - 2.2 mmol/L    Ct Abdomen Pelvis Wo Contrast  03/26/2014   CLINICAL DATA:  Hernia surgery approximately 1 week ago, now with pain and terrain itch at the incision site. Current history of end-stage renal disease on hemodialysis.  EXAM: CT ABDOMEN AND PELVIS WITHOUT CONTRAST  TECHNIQUE: Multidetector CT imaging of the abdomen and pelvis was performed following the standard protocol without IV contrast. Oral contrast was administered.  COMPARISON:  Visualized upper abdomen on  CT chest 09/01/2013. No prior abdominopelvic CT.  FINDINGS: Lack of intravenous contrast makes the examination less sensitive than it would be otherwise. Body wall edema diffusely throughout the subcutaneous tissues of the lower chest, abdomen and pelvis. Umbilical hernia which appears to contain a normal-appearing loop of fluid-filled small bowel, though this portion of the bowel is not filled with contrast and this may just represent fluid within the hernia sac. No contained fluid collections within the subcutaneous tissues or within the abdomen or pelvis to suggest abscess.  Mild hepatomegaly. Approximate 12 mm simple cyst arising from the right lobe of the liver; within the limits of the unenhanced technique, no significant focal hepatic parenchymal abnormality. Mild splenomegaly, the spleen measuring approximately 14.5 x 6.2 x 12.7 cm, yielding a volume of 570 ml. No focal splenic parenchymal abnormality. Normal unenhanced appearance of the pancreas. Mild bilateral adrenal hyperplasia without nodularity. Atrophic kidneys containing multiple cysts, consistent with end-stage renal disease. Gallbladder unremarkable by CT. No biliary ductal dilation. Moderate to severe aortoiliofemoral atherosclerosis without aneurysm.  Multiple upper normal size to mildly enlarged retroperitoneal lymph nodes. No nodal masses. Calcified lower mediastinal lymph nodes and intersegmental lymph nodes in the visualized lower thorax as noted on the prior chest CT. Calcified left retrocrural lymph  nodes in the upper abdomen.  Stomach decompressed. Small bowel normal in appearance. Large stool burden throughout the otherwise normal appearing colon. Mobile cecum positioned in the right upper quadrant. Small amount of ascites dependently in the pelvis.  Moderate to marked prostate gland enlargement, particularly the median lobe. Normal seminal vesicles. Urinary bladder decompressed and unremarkable. Scrotal edema, which goes along with the  extensive subcutaneous edema elsewhere.  Mosaic attenuation visualized lung bases with scattered areas of hyperlucency. Heart markedly enlarged. Small pericardial effusion. Bone window images demonstrate degenerative disc disease and spondylosis at L4-5.  IMPRESSION: 1. Severe diffuse body wall edema/anasarca. 2. No visible subcutaneous abscess or abscess within the anatomic abdomen or pelvis. Lack of intravenous contrast makes this determination less specific. 3. Small umbilical hernia which either contains a fluid-filled loop of normal appearing small bowel or is indurated with a small amount of fluid within the hernia. I favor the latter. 4. Hepatosplenomegaly. 5. Atrophic kidneys containing multiple cysts, consistent with end stage renal disease and hemodialysis. 6. Shotty retroperitoneal lymphadenopathy. Partially calcified lymphadenopathy involving the visualized lower thorax as described, unchanged since the prior CT chest from December, 2014. This is presumably related to prior granulomatous infection. 7. Small amount of intra-abdominal ascites. 8. Moderate to marked prostate gland enlargement, particularly the median lobe. 9. Scattered areas of hyperlucency in the visualized lung bases consistent with localized air trapping as can be seen in patients with asthma or bronchitis. 10. Marked cardiomegaly.  Small pericardial effusion.   Electronically Signed   By: Evangeline Dakin M.D.   On: 03/26/2014 15:24    Review of Systems  Constitutional: Negative.   HENT: Negative.   Eyes: Negative.   Respiratory: Positive for cough (chronic) and shortness of breath.   Cardiovascular: Positive for chest pain (early AM had pain, anterior chest/abdomen. just once he said it was like some one kicked him.), orthopnea and leg swelling.  Gastrointestinal: Positive for abdominal pain (says it hurts to move and get up.  Pain like appendicitis) and constipation. Negative for heartburn, nausea, vomiting, diarrhea, blood  in stool and melena.  Genitourinary: Negative.        He still make some urine.  Musculoskeletal: Negative.   Skin:       Drainage from abdomen  Neurological: Positive for dizziness.  Endo/Heme/Allergies: Negative.   Psychiatric/Behavioral: Negative.    Blood pressure 117/83, pulse 104, temperature 99.9 F (37.7 C), temperature source Rectal, resp. rate 25, SpO2 96.00%. Physical Exam  Constitutional: He appears well-developed and well-nourished.  Dyspneic male with  Marked body edema/jvd  HENT:  Head: Normocephalic and atraumatic.  Nose: Nose normal.  Neck: Normal range of motion. Neck supple. JVD present.  Cardiovascular: Regular rhythm and normal heart sounds.   No murmur heard. Tachycardic at rest lying at 30 degrees.  Respiratory: No respiratory distress (he says he stays somewhat SOB, today may be somewhat worse.). He has no wheezes. He has no rales. He exhibits no tenderness.  GI: He exhibits distension. He exhibits no mass. There is tenderness. There is no rebound and no guarding.  He is extremely sore and draining a fair amount from port site RUQ.  The remaining sites are dry and steri strip in place.  Serosanguinous fluid coming from open RUQ site.   He has diffuse tenderness and swelling of the entire abdomen.   The port sites show no cellulitis, around them.  Drainage as noted above is not purulent.  Musculoskeletal: He exhibits edema (both lower legs with edema).  Neurological: He is alert. No cranial nerve deficit.  Skin: Skin is warm and dry. No rash noted. No erythema. No pallor.  Psychiatric: He has a normal mood and affect. His behavior is normal. Judgment and thought content normal.    Assessment/Plan: 1.  S/p umbilical hernia repair with drainage from port site, post op pain, no cellulitis or infection 2.  ESRD on HD Patient Active Problem List   Diagnosis Date Noted  . S/P repair of ventral hernia 03/21/2014  . DCM (dilated cardiomyopathy) 03/01/2014  .  Coronary artery calcification seen on CAT scan 03/01/2014  . Mechanical complication of other vascular device, implant, and graft 11/24/2013  . Abnormal EKG 09/12/2013  . Hematoma complicating a procedure 09/02/2013  . Fever of undetermined origin 09/01/2013  . Viral syndrome 09/01/2013  . Fever in adult 09/01/2013  . Cellulitis and abscess of leg 08/15/2013  . Cellulitis 08/15/2013  . Pulmonary embolism   . Hepatomegaly 07/15/2013  . DVT of lower extremity (deep venous thrombosis) RLE 06/29/2013  . GERD (gastroesophageal reflux disease)   . ESRD (end stage renal disease) on dialysis   . CHF (congestive heart failure)   . Anemia   . End stage renal disease 05/19/2013  . Cough 05/17/2013  . Hemoptysis 05/17/2013  . Fever, unspecified 05/04/2013  . ESRD on hemodialysis 05/03/2013  . Atrial fibrillation 04/30/2013  . Renal failure, chronic 04/27/2013  . Syncope 04/27/2013  . Atrial flutter 04/27/2013  . OBSTRUCTIVE SLEEP APNEA 05/02/2010  . ALLERGIC RHINITIS 03/07/2008  . DISORDERS OF PHOSPHORUS METABOLISM 02/24/2008  . HYPERCALCEMIA 02/24/2008  . MYALGIA 02/23/2008  . UNSPECIFIED VITAMIN DEFICIENCY 12/22/2007  . ANEMIA, IRON DEFICIENCY 12/22/2007  . HYPERTENSION 12/22/2007  . SECONDARY HYPERPARATHYROIDISM 12/22/2007   Plan:  Pt was seen by Dr. Redmond Pulling, and we dermabonded the open site. The is no sign of infection.   It was his opinion pt did not require admission for this.  He will defer to medicine about his severe anasarca and medical management.  If he is admitted we will be glad to follow. Trevor [name] 03/26/2014, 4:58 PM

## 2014-03-26 NOTE — ED Notes (Signed)
Surgeon at bedside to eval pt.

## 2014-03-26 NOTE — ED Notes (Signed)
Pt arrived by [facility] from home. Had hernia surgery last week, having sharp stabbing pain near incision site and now having increase in drainage. Has swelling to abd and legs. Last dialysis was Friday.

## 2014-03-26 NOTE — ED Notes (Signed)
Dr Andrey Campanile at bedside, applied dermabond to wound and applied dressing. Ordered abd binder

## 2014-03-26 NOTE — Discharge Instructions (Signed)

## 2014-03-26 NOTE — ED Provider Notes (Signed)
CSN: [id number]     Arrival date & time 03/26/14  1139 History   First MD Initiated Contact with Patient 03/26/14 1143     Chief Complaint  Patient presents with  . Abdominal Pain  . Wound Check     (Consider location/radiation/quality/duration/timing/severity/associated sxs/prior Treatment) Patient is a 63 y.o. male presenting with abdominal pain and wound check. The history is provided by the patient and the spouse. No language interpreter was used.  Abdominal Pain Pain location:  Generalized Pain quality: aching   Pain quality comment:  Had brief severe sharp episode last night Pain radiates to:  Does not radiate Pain severity:  Moderate Duration:  4 days Timing:  Constant Progression:  Waxing and waning Chronicity:  New Context: previous surgery (lap hernia repair 4 days ago)   Relieved by:  Nothing Worsened by:  Nothing tried Ineffective treatments:  None tried Associated symptoms: no anorexia, no chest pain, no constipation, no cough, no diarrhea, no dysuria, no fatigue, no fever, no nausea, no shortness of breath and no vomiting   Risk factors: being elderly, multiple surgeries and obesity   Risk factors comment:  ESRD Wound Check This is a new problem. The current episode started more than 2 days ago. The problem occurs constantly. The problem has been gradually worsening (increased serosanguinous drainage). Associated symptoms include abdominal pain. Pertinent negatives include no chest pain, no headaches and no shortness of breath. Nothing aggravates the symptoms. Nothing relieves the symptoms. He has tried nothing for the symptoms. The treatment provided no relief.    Past Medical History  Diagnosis Date  . Anemia   . AVF (arteriovenous fistula)     Left  . Secondary hyperparathyroidism   . Hypovitaminosis D   . Hypertensive urgency     H/o  . CHF (congestive heart failure)   . Exertional shortness of breath     "related to infection in my lungs right now"  (05/03/2013)  . History of gout     "before I started doing the dialysis" (05/03/2013)  . Sleep apnea   . GERD (gastroesophageal reflux disease)   . Syncope     felt secondary to residual anesthesia the day before - 2D echo unremarkable  . Pulmonary embolism     with right DVT secondary to recent surgery  . Atrial fibrillation     not on coumadin due to large spontaneous hematoma on back and anemia  . Myocardial infarction 90's  . Coronary artery disease   . Dysrhythmia     afib  . Peripheral vascular disease     dvt leg 12/14  . Hypertension   . ESRD (end stage renal disease) on dialysis     [facility] mon/wed/fri   Past Surgical History  Procedure Laterality Date  . Av fistula placement Left     Dr. Charlean Sanfilippo; "I've had 2 on the left' (05/03/2013)  . Av fistula placement Right ~ 2011  . Knee arthroscopy Left   . Avgg removal Right 05/04/2013    Procedure: REMOVAL OF ARTERIOVENOUS Fistula Right Arm;  Surgeon: [name], MD;  Location: [facility] OR;  Service: Vascular;  Laterality: Right;  . Insertion of dialysis catheter Right 05/04/2013    Procedure: INSERTION OF DIALYSIS CATHETER;  Surgeon: [name], MD;  Location: [facility] OR;  Service: Vascular;  Laterality: Right;  . Colonoscopy      Hx: of  . Bascilic vein transposition Left 06/27/2013    Procedure: BASCILIC VEIN TRANSPOSITION;  Surgeon: Janetta Hora  Fields, MD;  Location: [facility] OR;  Service: Vascular;  Laterality: Left;  . Cardioversion N/A 08/29/2013    Procedure: CARDIOVERSION;  Surgeon: [name], MD;  Location: [facility] ENDOSCOPY;  Service: Cardiovascular;  Laterality: N/A;  . Removal of a dialysis catheter  2/15  . Hernia repair  03/21/14    Umbilical hernia-Dr. Derrell Lolling  . Umbilical hernia repair N/A 03/21/2014    Procedure: LAPAROSCOPIC UMBILICAL HERNIA REPAIR WITH MESH;  Surgeon: Axel Filler, MD;  Location: [facility] OR;  Service: General;  Laterality: N/A;  . Insertion of mesh N/A 03/21/2014    Procedure: INSERTION OF MESH;   Surgeon: Axel Filler, MD;  Location: [facility] OR;  Service: General;  Laterality: N/A;   Family History  Problem Relation Age of Onset  . Hypertension Father   . Kidney disease Father   . Allergies Father   . Deep vein thrombosis Sister   . Pulmonary embolism Sister   . Diabetes Paternal Grandmother    History  Substance Use Topics  . Smoking status: Former Smoker -- 0.25 packs/day for .5 years    Types: Cigarettes    Quit date: 09/08/1972  . Smokeless tobacco: Never Used  . Alcohol Use: Yes     Comment: 05/03/2013 "haven't had a glass of wine in ~ 3 months or so; sometimes will have one w/dinner"    Review of Systems  Constitutional: Negative for fever, activity change, appetite change and fatigue.  HENT: Negative for congestion, facial swelling, rhinorrhea and trouble swallowing.   Eyes: Negative for photophobia and pain.  Respiratory: Negative for cough, chest tightness and shortness of breath.   Cardiovascular: Negative for chest pain and leg swelling.  Gastrointestinal: Positive for abdominal pain. Negative for nausea, vomiting, diarrhea, constipation and anorexia.  Endocrine: Negative for polydipsia and polyuria.  Genitourinary: Negative for dysuria, urgency, decreased urine volume and difficulty urinating.  Musculoskeletal: Negative for back pain and gait problem.  Skin: Negative for color change, rash and wound.  Allergic/Immunologic: Negative for immunocompromised state.  Neurological: Negative for dizziness, facial asymmetry, speech difficulty, weakness, numbness and headaches.  Psychiatric/Behavioral: Negative for confusion, decreased concentration and agitation.      Allergies  Pork-derived products  Home Medications   Prior to Admission medications   Medication Sig Start Date End Date Taking? Authorizing Provider  amLODipine (NORVASC) 10 MG tablet Take 10 mg by mouth daily.     Yes Historical Provider, MD  aspirin EC 81 MG tablet Take 1 tablet (81 mg total) by  mouth daily. 09/06/13  Yes Penny Pia, MD  labetalol (NORMODYNE) 200 MG tablet Take 200 mg by mouth 2 (two) times daily.   Yes Historical Provider, MD  oxyCODONE-acetaminophen (PERCOCET/ROXICET) 5-325 MG per tablet Take 1 tablet by mouth every 4 (four) hours as needed for severe pain.   Yes Historical Provider, MD  sevelamer carbonate (RENVELA) 800 MG tablet Take 2,400-3,200 mg by mouth See admin instructions. 3200mg  with each meal, 2400-3200mg  with each snack   Yes Historical Provider, MD  vitamin C (ASCORBIC ACID) 500 MG tablet Take 500-1,500 mg by mouth daily.   Yes Historical Provider, MD   BP 91/47  Pulse 79  Temp(Src) 99.9 F (37.7 C) (Rectal)  Resp 18  SpO2 96% Physical Exam  Constitutional: He is oriented to person, place, and time. He appears well-developed and well-nourished. No distress.  HENT:  Head: Normocephalic and atraumatic.  Mouth/Throat: No oropharyngeal exudate.  Eyes: Pupils are equal, round, and reactive to light.  Neck: Normal range of  motion. Neck supple.  Cardiovascular: Normal rate, regular rhythm and normal heart sounds.  Exam reveals no gallop and no friction rub.   No murmur heard. Pulmonary/Chest: Effort normal and breath sounds normal. No respiratory distress. He has no wheezes. He has no rales.  Abdominal: Soft. Bowel sounds are normal. He exhibits distension and ascites. He exhibits no mass. There is generalized tenderness. There is no rigidity, no rebound and no guarding.    Musculoskeletal: Normal range of motion. He exhibits no edema and no tenderness.  Neurological: He is alert and oriented to person, place, and time.  Skin: Skin is warm and dry.  Psychiatric: He has a normal mood and affect.    ED Course  Procedures (including critical care time) Labs Review Labs Reviewed  CBC WITH DIFFERENTIAL - Abnormal; Notable for the following:    RBC 4.18 (*)    Hemoglobin 12.0 (*)    HCT 33.9 (*)    RDW 19.2 (*)    Platelets 125 (*)     Lymphocytes Relative 7 (*)    Lymphs Abs 0.3 (*)    Monocytes Relative 17 (*)    All other components within normal limits  COMPREHENSIVE METABOLIC PANEL - Abnormal; Notable for the following:    Sodium 136 (*)    Chloride 92 (*)    BUN 52 (*)    Creatinine, Ser 6.55 (*)    Albumin 2.5 (*)    Alkaline Phosphatase 169 (*)    Total Bilirubin 3.1 (*)    GFR calc non Af Amer 8 (*)    GFR calc Af Amer 9 (*)    Anion gap 18 (*)    All other components within normal limits  I-STAT CG4 LACTIC ACID, ED    Imaging Review Ct Abdomen Pelvis Wo Contrast  03/26/2014   CLINICAL DATA:  Hernia surgery approximately 1 week ago, now with pain and terrain itch at the incision site. Current history of end-stage renal disease on hemodialysis.  EXAM: CT ABDOMEN AND PELVIS WITHOUT CONTRAST  TECHNIQUE: Multidetector CT imaging of the abdomen and pelvis was performed following the standard protocol without IV contrast. Oral contrast was administered.  COMPARISON:  Visualized upper abdomen on CT chest 09/01/2013. No prior abdominopelvic CT.  FINDINGS: Lack of intravenous contrast makes the examination less sensitive than it would be otherwise. Body wall edema diffusely throughout the subcutaneous tissues of the lower chest, abdomen and pelvis. Umbilical hernia which appears to contain a normal-appearing loop of fluid-filled small bowel, though this portion of the bowel is not filled with contrast and this may just represent fluid within the hernia sac. No contained fluid collections within the subcutaneous tissues or within the abdomen or pelvis to suggest abscess.  Mild hepatomegaly. Approximate 12 mm simple cyst arising from the right lobe of the liver; within the limits of the unenhanced technique, no significant focal hepatic parenchymal abnormality. Mild splenomegaly, the spleen measuring approximately 14.5 x 6.2 x 12.7 cm, yielding a volume of 570 ml. No focal splenic parenchymal abnormality. Normal unenhanced  appearance of the pancreas. Mild bilateral adrenal hyperplasia without nodularity. Atrophic kidneys containing multiple cysts, consistent with end-stage renal disease. Gallbladder unremarkable by CT. No biliary ductal dilation. Moderate to severe aortoiliofemoral atherosclerosis without aneurysm.  Multiple upper normal size to mildly enlarged retroperitoneal lymph nodes. No nodal masses. Calcified lower mediastinal lymph nodes and intersegmental lymph nodes in the visualized lower thorax as noted on the prior chest CT. Calcified left retrocrural lymph nodes in the upper  abdomen.  Stomach decompressed. Small bowel normal in appearance. Large stool burden throughout the otherwise normal appearing colon. Mobile cecum positioned in the right upper quadrant. Small amount of ascites dependently in the pelvis.  Moderate to marked prostate gland enlargement, particularly the median lobe. Normal seminal vesicles. Urinary bladder decompressed and unremarkable. Scrotal edema, which goes along with the extensive subcutaneous edema elsewhere.  Mosaic attenuation visualized lung bases with scattered areas of hyperlucency. Heart markedly enlarged. Small pericardial effusion. Bone window images demonstrate degenerative disc disease and spondylosis at L4-5.  IMPRESSION: 1. Severe diffuse body wall edema/anasarca. 2. No visible subcutaneous abscess or abscess within the anatomic abdomen or pelvis. Lack of intravenous contrast makes this determination less specific. 3. Small umbilical hernia which either contains a fluid-filled loop of normal appearing small bowel or is indurated with a small amount of fluid within the hernia. I favor the latter. 4. Hepatosplenomegaly. 5. Atrophic kidneys containing multiple cysts, consistent with end stage renal disease and hemodialysis. 6. Shotty retroperitoneal lymphadenopathy. Partially calcified lymphadenopathy involving the visualized lower thorax as described, unchanged since the prior CT chest  from December, 2014. This is presumably related to prior granulomatous infection. 7. Small amount of intra-abdominal ascites. 8. Moderate to marked prostate gland enlargement, particularly the median lobe. 9. Scattered areas of hyperlucency in the visualized lung bases consistent with localized air trapping as can be seen in patients with asthma or bronchitis. 10. Marked cardiomegaly.  Small pericardial effusion.   Electronically Signed   By: Hulan Saashomas  Lawrence M.D.   On: 03/26/2014 15:24     EKG Interpretation None      MDM   Final diagnoses:  Post-op pain  Wound dehiscence, initial encounter    Pt is a 63 y.o. male with Pmhx as above who presents with increased abdominal pain, and increased serosanguinous drainage from a port site from laparoscopic hernia repair 4 days ago. Denies fever, though has T 99.9 rectally. He has generalized abdominal tenderness w/ ascites, mild erythema of lower half of pannus w/o clear demarcation.  He has serosangious drainage from a port wound in RUQ with dehiscence of wound. WBC nml. CT ab/palvis w/o obvious source of infection. Dr. Andrey CampanileWilson with [facility] consulted on pt in ED did not feel he needed admitted from a surgical standpoint and did not have a post-op wound infection. Will d/c home a/ abdominal binder and outpt f/u with Dr. Derrell Lollingamirez.  Return precautions given for new or worsening symptoms including worsening pain, fever.           Shanna CiscoMegan E Tanmay Halteman, MD 03/26/14 2100

## 2014-03-26 NOTE — ED Notes (Signed)
Lab results reported to EDP. 

## 2014-03-26 NOTE — Progress Notes (Signed)
Orthopedic Tech Progress Note Patient Details:  Trevor [name] 1951/04/27 [id number] Delivered to nurse Ortho Devices Type of Ortho Device: Abdominal binder Ortho Device/Splint Interventions: Ordered   Asia Burnett Kanaris 03/26/2014, 5:22 PM

## 2014-03-26 NOTE — ED Notes (Signed)
Pt completed contrast, CT made aware.

## 2014-03-26 NOTE — Telephone Encounter (Signed)
Patient s/p umbilical hernia repair last week.  Stating large amount of bloody fluid draining from wound.  Would like to be evaluated in the ED

## 2014-03-29 ENCOUNTER — Telehealth (INDEPENDENT_AMBULATORY_CARE_PROVIDER_SITE_OTHER): Payer: Self-pay | Admitting: General Surgery

## 2014-03-29 NOTE — Telephone Encounter (Signed)
Pt called to alert Dr. Derrell Lolling his surgical site is draining again.  Today he had dialysis and the site began oozing serous fluid.  The staff covered it with absorbant gauze and told him to call [facility].  He has previously gone to ER for bloody drainage.  His follow up appt is on 04/13/14.  Advised pt to continue to use absorbant pads and monitor for serous vs bloody drainage.  If he notes bright red blood in a continuous flow, go back to the ER.  He understands and will comply.

## 2014-03-29 NOTE — Telephone Encounter (Signed)
Patient status post Laparoscopic Umbilical Hernia Repair on 03/21/14.  Called and spoke to patient regarding drainage at his surgical site.  Advised patient to keep dry gauze over the area while draining.  Patient has post op appointment on 04/13/14 @ 12:00pm with Dr. Derrell Lolling.  Patient advised to call our office if he has an increase in drainage, redness or swelling at the surgical site for an Urgent Office Appointment.  Patient verbalized understanding.

## 2014-04-07 DIAGNOSIS — N186 End stage renal disease: Secondary | ICD-10-CM | POA: Diagnosis not present

## 2014-04-10 DIAGNOSIS — N2581 Secondary hyperparathyroidism of renal origin: Secondary | ICD-10-CM | POA: Diagnosis not present

## 2014-04-10 DIAGNOSIS — D509 Iron deficiency anemia, unspecified: Secondary | ICD-10-CM | POA: Diagnosis not present

## 2014-04-10 DIAGNOSIS — N186 End stage renal disease: Secondary | ICD-10-CM | POA: Diagnosis not present

## 2014-04-10 DIAGNOSIS — D631 Anemia in chronic kidney disease: Secondary | ICD-10-CM | POA: Diagnosis not present

## 2014-04-13 ENCOUNTER — Encounter (INDEPENDENT_AMBULATORY_CARE_PROVIDER_SITE_OTHER): Payer: Self-pay | Admitting: General Surgery

## 2014-04-13 ENCOUNTER — Ambulatory Visit (INDEPENDENT_AMBULATORY_CARE_PROVIDER_SITE_OTHER): Payer: Medicare Other | Admitting: General Surgery

## 2014-04-13 VITALS — BP 156/100 | HR 57 | Temp 97.3°F | Ht 73.0 in | Wt 219.0 lb

## 2014-04-13 DIAGNOSIS — Z9889 Other specified postprocedural states: Secondary | ICD-10-CM

## 2014-04-13 NOTE — Progress Notes (Signed)
Patient ID: Trevor [name], male   DOB: 1951-01-08, 63 y.o.   MRN: [id number] Post op course Patient is a 63 year old male status post left upper umbilical hernia repair with mesh. Patient has been doing well postoperatively. Patient has a ligature the cystic fluid however this is resolved. Patient is getting back to normal activity.  On Exam: Wounds are clean dry and intact, there is no hernial palpation   Assessment and Plan 63 year old male status post laparoscopic umbilical hernia repair with mesh. 1. Patient to follow up as needed 2. We discussed what was restriction total of 6 weeks.    Trevor Filler, MD [facility] General & Minimally Invasive Surgery Trauma & Emergency Surgery

## 2014-05-08 DIAGNOSIS — N186 End stage renal disease: Secondary | ICD-10-CM | POA: Diagnosis not present

## 2014-05-09 ENCOUNTER — Ambulatory Visit (HOSPITAL_COMMUNITY): Payer: Medicare Other | Attending: Cardiovascular Disease | Admitting: Radiology

## 2014-05-09 DIAGNOSIS — R55 Syncope and collapse: Secondary | ICD-10-CM | POA: Diagnosis not present

## 2014-05-09 DIAGNOSIS — I509 Heart failure, unspecified: Secondary | ICD-10-CM | POA: Insufficient documentation

## 2014-05-09 DIAGNOSIS — Z87891 Personal history of nicotine dependence: Secondary | ICD-10-CM | POA: Insufficient documentation

## 2014-05-09 DIAGNOSIS — I120 Hypertensive chronic kidney disease with stage 5 chronic kidney disease or end stage renal disease: Secondary | ICD-10-CM | POA: Insufficient documentation

## 2014-05-09 DIAGNOSIS — G4733 Obstructive sleep apnea (adult) (pediatric): Secondary | ICD-10-CM | POA: Diagnosis not present

## 2014-05-09 DIAGNOSIS — N186 End stage renal disease: Secondary | ICD-10-CM | POA: Diagnosis not present

## 2014-05-09 DIAGNOSIS — I4891 Unspecified atrial fibrillation: Secondary | ICD-10-CM

## 2014-05-09 DIAGNOSIS — I428 Other cardiomyopathies: Secondary | ICD-10-CM | POA: Diagnosis not present

## 2014-05-09 NOTE — Progress Notes (Signed)
Echocardiogram performed.  

## 2014-05-10 DIAGNOSIS — N2581 Secondary hyperparathyroidism of renal origin: Secondary | ICD-10-CM | POA: Diagnosis not present

## 2014-05-10 DIAGNOSIS — D509 Iron deficiency anemia, unspecified: Secondary | ICD-10-CM | POA: Diagnosis not present

## 2014-05-10 DIAGNOSIS — N186 End stage renal disease: Secondary | ICD-10-CM | POA: Diagnosis not present

## 2014-05-10 DIAGNOSIS — D631 Anemia in chronic kidney disease: Secondary | ICD-10-CM | POA: Diagnosis not present

## 2014-06-07 DIAGNOSIS — N186 End stage renal disease: Secondary | ICD-10-CM | POA: Diagnosis not present

## 2014-06-09 DIAGNOSIS — N2581 Secondary hyperparathyroidism of renal origin: Secondary | ICD-10-CM | POA: Diagnosis not present

## 2014-06-09 DIAGNOSIS — D509 Iron deficiency anemia, unspecified: Secondary | ICD-10-CM | POA: Diagnosis not present

## 2014-06-09 DIAGNOSIS — D631 Anemia in chronic kidney disease: Secondary | ICD-10-CM | POA: Diagnosis not present

## 2014-06-09 DIAGNOSIS — N186 End stage renal disease: Secondary | ICD-10-CM | POA: Diagnosis not present

## 2014-06-16 ENCOUNTER — Encounter: Payer: Self-pay | Admitting: Cardiology

## 2014-07-08 DIAGNOSIS — N186 End stage renal disease: Secondary | ICD-10-CM | POA: Diagnosis not present

## 2014-07-08 DIAGNOSIS — Z992 Dependence on renal dialysis: Secondary | ICD-10-CM | POA: Diagnosis not present

## 2014-07-10 DIAGNOSIS — D631 Anemia in chronic kidney disease: Secondary | ICD-10-CM | POA: Diagnosis not present

## 2014-07-10 DIAGNOSIS — N186 End stage renal disease: Secondary | ICD-10-CM | POA: Diagnosis not present

## 2014-07-10 DIAGNOSIS — N2581 Secondary hyperparathyroidism of renal origin: Secondary | ICD-10-CM | POA: Diagnosis not present

## 2014-07-10 DIAGNOSIS — L299 Pruritus, unspecified: Secondary | ICD-10-CM | POA: Diagnosis not present

## 2014-08-02 ENCOUNTER — Encounter: Payer: Self-pay | Admitting: Cardiology

## 2014-08-07 DIAGNOSIS — Z992 Dependence on renal dialysis: Secondary | ICD-10-CM | POA: Diagnosis not present

## 2014-08-07 DIAGNOSIS — N186 End stage renal disease: Secondary | ICD-10-CM | POA: Diagnosis not present

## 2014-08-09 DIAGNOSIS — D631 Anemia in chronic kidney disease: Secondary | ICD-10-CM | POA: Diagnosis not present

## 2014-08-09 DIAGNOSIS — N186 End stage renal disease: Secondary | ICD-10-CM | POA: Diagnosis not present

## 2014-08-09 DIAGNOSIS — N2581 Secondary hyperparathyroidism of renal origin: Secondary | ICD-10-CM | POA: Diagnosis not present

## 2014-08-10 DIAGNOSIS — Z992 Dependence on renal dialysis: Secondary | ICD-10-CM | POA: Diagnosis not present

## 2014-08-10 DIAGNOSIS — N186 End stage renal disease: Secondary | ICD-10-CM | POA: Diagnosis not present

## 2014-08-10 DIAGNOSIS — L298 Other pruritus: Secondary | ICD-10-CM | POA: Diagnosis not present

## 2014-08-10 DIAGNOSIS — R21 Rash and other nonspecific skin eruption: Secondary | ICD-10-CM | POA: Diagnosis not present

## 2014-08-11 DIAGNOSIS — N186 End stage renal disease: Secondary | ICD-10-CM | POA: Diagnosis not present

## 2014-08-11 DIAGNOSIS — N2581 Secondary hyperparathyroidism of renal origin: Secondary | ICD-10-CM | POA: Diagnosis not present

## 2014-08-11 DIAGNOSIS — D631 Anemia in chronic kidney disease: Secondary | ICD-10-CM | POA: Diagnosis not present

## 2014-08-14 DIAGNOSIS — N2581 Secondary hyperparathyroidism of renal origin: Secondary | ICD-10-CM | POA: Diagnosis not present

## 2014-08-14 DIAGNOSIS — N186 End stage renal disease: Secondary | ICD-10-CM | POA: Diagnosis not present

## 2014-08-14 DIAGNOSIS — D631 Anemia in chronic kidney disease: Secondary | ICD-10-CM | POA: Diagnosis not present

## 2014-08-16 DIAGNOSIS — N186 End stage renal disease: Secondary | ICD-10-CM | POA: Diagnosis not present

## 2014-08-16 DIAGNOSIS — N2581 Secondary hyperparathyroidism of renal origin: Secondary | ICD-10-CM | POA: Diagnosis not present

## 2014-08-16 DIAGNOSIS — D631 Anemia in chronic kidney disease: Secondary | ICD-10-CM | POA: Diagnosis not present

## 2014-08-17 ENCOUNTER — Encounter (HOSPITAL_COMMUNITY): Payer: Self-pay | Admitting: Vascular Surgery

## 2014-08-18 DIAGNOSIS — D631 Anemia in chronic kidney disease: Secondary | ICD-10-CM | POA: Diagnosis not present

## 2014-08-18 DIAGNOSIS — N186 End stage renal disease: Secondary | ICD-10-CM | POA: Diagnosis not present

## 2014-08-18 DIAGNOSIS — N2581 Secondary hyperparathyroidism of renal origin: Secondary | ICD-10-CM | POA: Diagnosis not present

## 2014-08-21 DIAGNOSIS — N186 End stage renal disease: Secondary | ICD-10-CM | POA: Diagnosis not present

## 2014-08-21 DIAGNOSIS — D631 Anemia in chronic kidney disease: Secondary | ICD-10-CM | POA: Diagnosis not present

## 2014-08-21 DIAGNOSIS — N2581 Secondary hyperparathyroidism of renal origin: Secondary | ICD-10-CM | POA: Diagnosis not present

## 2014-08-23 DIAGNOSIS — N2581 Secondary hyperparathyroidism of renal origin: Secondary | ICD-10-CM | POA: Diagnosis not present

## 2014-08-23 DIAGNOSIS — N186 End stage renal disease: Secondary | ICD-10-CM | POA: Diagnosis not present

## 2014-08-23 DIAGNOSIS — D631 Anemia in chronic kidney disease: Secondary | ICD-10-CM | POA: Diagnosis not present

## 2014-08-25 DIAGNOSIS — N2581 Secondary hyperparathyroidism of renal origin: Secondary | ICD-10-CM | POA: Diagnosis not present

## 2014-08-25 DIAGNOSIS — D631 Anemia in chronic kidney disease: Secondary | ICD-10-CM | POA: Diagnosis not present

## 2014-08-25 DIAGNOSIS — N186 End stage renal disease: Secondary | ICD-10-CM | POA: Diagnosis not present

## 2014-08-28 DIAGNOSIS — D631 Anemia in chronic kidney disease: Secondary | ICD-10-CM | POA: Diagnosis not present

## 2014-08-28 DIAGNOSIS — N2581 Secondary hyperparathyroidism of renal origin: Secondary | ICD-10-CM | POA: Diagnosis not present

## 2014-08-28 DIAGNOSIS — N186 End stage renal disease: Secondary | ICD-10-CM | POA: Diagnosis not present

## 2014-08-29 ENCOUNTER — Encounter: Payer: Self-pay | Admitting: Cardiology

## 2014-08-29 ENCOUNTER — Ambulatory Visit (INDEPENDENT_AMBULATORY_CARE_PROVIDER_SITE_OTHER): Payer: Medicare Other | Admitting: Cardiology

## 2014-08-29 VITALS — BP 118/86 | HR 92 | Ht 73.5 in | Wt 219.1 lb

## 2014-08-29 DIAGNOSIS — R0602 Shortness of breath: Secondary | ICD-10-CM

## 2014-08-29 DIAGNOSIS — I2510 Atherosclerotic heart disease of native coronary artery without angina pectoris: Secondary | ICD-10-CM

## 2014-08-29 DIAGNOSIS — I420 Dilated cardiomyopathy: Secondary | ICD-10-CM

## 2014-08-29 DIAGNOSIS — R9431 Abnormal electrocardiogram [ECG] [EKG]: Secondary | ICD-10-CM

## 2014-08-29 DIAGNOSIS — I482 Chronic atrial fibrillation, unspecified: Secondary | ICD-10-CM

## 2014-08-29 DIAGNOSIS — I10 Essential (primary) hypertension: Secondary | ICD-10-CM

## 2014-08-29 LAB — BRAIN NATRIURETIC PEPTIDE: Pro B Natriuretic peptide (BNP): 1830 pg/mL — ABNORMAL HIGH (ref 0.0–100.0)

## 2014-08-29 NOTE — Progress Notes (Signed)
[address] [place], [place]  [address] Phone: [phone] Fax:  [phone]  Date:  08/29/2014   ID:  Trevor [name], DOB 02/16/1951, MRN [id number]  PCP:  Dyke Maes, MD  Cardiologist:  Armanda Magic, MD    History of Present Illness: Trevor [name] is a 63 y.o. male with a history of HTN, ESRD, afib, systemic anticoagulation and syncope secondary felt secondary to afib with RVR who presents back today for followup. He recently underwent DCCV to NSR on 12/22. He then developed a large hematoma on his back and developed fevers and cough and presented to the ER on 12/25. He was taken off Coumadin due to large hematoma significant anemia and is now just on ASA. He was seen recently by Urgent Care to determine whether it was safe to restart Coumadin. His hematoma had completely resolved and his PCP says he was ok to restart coumadin. He refused to go back on coumadin at his last OV. He denies any chest pain, dizziness, palpitations or syncope. Intermittently he will have some SOB which occurs after walking long distances and is stable. He thinks part of his increased abdominal girth is due to distended abdomen.  He also is complaining of a puritic skin rash all over his back and chest and is seeing dermatology for a skin bx.  Wt Readings from Last 3 Encounters:  08/29/14 219 lb 1.9 oz (99.392 kg)  04/13/14 219 lb (99.338 kg)  03/23/14 216 lb 11.4 oz (98.3 kg)     Past Medical History  Diagnosis Date  . Anemia   . AVF (arteriovenous fistula)     Left  . Secondary hyperparathyroidism   . Hypovitaminosis D   . Hypertensive urgency     H/o  . CHF (congestive heart failure)   . Exertional shortness of breath     "related to infection in my lungs right now" (05/03/2013)  . History of gout     "before I started doing the dialysis" (05/03/2013)  . Sleep apnea   . GERD (gastroesophageal reflux disease)   . Syncope     felt secondary to residual anesthesia the day  before - 2D echo unremarkable  . Pulmonary embolism     with right DVT secondary to recent surgery  . Atrial fibrillation     not on coumadin due to large spontaneous hematoma on back and anemia  . Myocardial infarction 90's  . Coronary artery disease   . Dysrhythmia     afib  . Peripheral vascular disease     dvt leg 12/14  . Hypertension   . ESRD (end stage renal disease) on dialysis     [facility] mon/wed/fri    Current Outpatient Prescriptions  Medication Sig Dispense Refill  . amLODipine (NORVASC) 10 MG tablet Take 10 mg by mouth daily.      Marland Kitchen aspirin EC 81 MG tablet Take 1 tablet (81 mg total) by mouth daily. 30 tablet 0  . labetalol (NORMODYNE) 200 MG tablet Take 200 mg by mouth 2 (two) times daily.    . multivitamin (RENA-VIT) TABS tablet Take 1 tablet by mouth daily.  0  . Nutritional Supplements (NEPRO) LIQD Take by mouth daily.    . sevelamer carbonate (RENVELA) 800 MG tablet Take 2,400-3,200 mg by mouth See admin instructions. 3200mg  with each meal, 2400-3200mg  with each snack    . triamcinolone ointment (KENALOG) 0.5 % Apply 0.5 application topically daily.  0  . vitamin C (ASCORBIC  ACID) 500 MG tablet Take 500-1,500 mg by mouth as needed.      No current facility-administered medications for this visit.    Allergies:    Allergies  Allergen Reactions  . Pork-Derived Products Nausea And Vomiting    Culture    Social History:  The patient  reports that he quit smoking about 42 years ago. His smoking use included Cigarettes. He has a .125 pack-year smoking history. He has never used smokeless tobacco. He reports that he drinks alcohol. He reports that he does not use illicit drugs.   Family History:  The patient's family history includes Allergies in his father; Deep vein thrombosis in his sister; Diabetes in his paternal grandmother; Hypertension in his father; Kidney disease in his father; Pulmonary embolism in his sister.   ROS:  Please see the history of present  illness.      All other systems reviewed and negative.   PHYSICAL EXAM: VS:  BP 118/86 mmHg  Pulse 92  Ht 6' 1.5" (1.867 m)  Wt 219 lb 1.9 oz (99.392 kg)  BMI 28.51 kg/m2 Well nourished, well developed, in no acute distress HEENT: normal Neck: no JVD Cardiac:  normal S1, S2; RRR; no murmur Lungs:  clear to auscultation bilaterally, no wheezing, rhonchi or rales Abd: soft, nontender, no hepatomegaly Ext: no edema Skin: warm and dry Neuro:  CNs 2-12 intact, no focal abnormalities noted  EKG:  Atrial fibrillation with CVR  And nonspecific T wave abnormality     ASSESSMENT AND PLAN:  1. Chronic atrial fibrillation rate controlled he remains adamant that he does not want to be on anticoagulation. He understands the increased risk of CVA off anticoagulation and accepts the risk.   - continue labetalol  2. HTN -controlled  - continue amlodipine/Labetatolol  3. Recent DVT with PE off anticoagulation due to anemia and hematoma  5. Abnormal EKG with diffuse ST/T wave abnormality - he had a recent low risk nuclear stress test with a fixed inferior defect with 2D echo done 04/2013 showing normal LVF with no RWMA's. Repeat echo showed reduced LVF and he underwent coronary CTA showing significantly calcified coronary arteries but no obstructive lesions.  6. ASCAD by CTA with no obstructive lesions. He needs aggressive risk factor modification.LDL is at goal.  7. 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 Tachycardia induced from prior afib.He complains of increased abdominal girth.  His weight is stable and does not appear volume overloaded. I will check a BNP todayl 9.  Distended abdomen - he has had a lot of complaint of increased abdominal girth and distension worse with eating.  On exam his abdomen is markedly distended.  I will get him in to see his PCP today.  Followup with me in 6  months    Signed, Armanda Magicraci [name], MD [facility] 08/29/2014 1:41 PM

## 2014-08-29 NOTE — Patient Instructions (Addendum)
Your physician recommends that you return for lab work in: Today (BNP)  Your physician wants you to follow-up in: 6 MONTHS with [name]. You will receive a reminder letter in the mail two months in advance. If you don't receive a letter, please call our office to schedule the follow-up appointment.    [name] has recommend you visit your Primary Dr., Dr.Odem.  An appointment has been made for you on Thursday, 12/24 @ 10:30am.

## 2014-08-30 ENCOUNTER — Encounter (HOSPITAL_COMMUNITY): Payer: Self-pay | Admitting: Emergency Medicine

## 2014-08-30 ENCOUNTER — Inpatient Hospital Stay (HOSPITAL_COMMUNITY): Payer: Medicare Other

## 2014-08-30 ENCOUNTER — Emergency Department (HOSPITAL_COMMUNITY): Payer: Medicare Other

## 2014-08-30 ENCOUNTER — Inpatient Hospital Stay (HOSPITAL_COMMUNITY)
Admission: EM | Admit: 2014-08-30 | Discharge: 2014-09-12 | DRG: 981 | Disposition: A | Discharge status: Skilled Nursing Facility | Payer: Medicare Other | Attending: Internal Medicine | Admitting: Internal Medicine

## 2014-08-30 DIAGNOSIS — I2510 Atherosclerotic heart disease of native coronary artery without angina pectoris: Secondary | ICD-10-CM | POA: Diagnosis present

## 2014-08-30 DIAGNOSIS — D6959 Other secondary thrombocytopenia: Secondary | ICD-10-CM | POA: Diagnosis present

## 2014-08-30 DIAGNOSIS — R0602 Shortness of breath: Secondary | ICD-10-CM | POA: Diagnosis not present

## 2014-08-30 DIAGNOSIS — R05 Cough: Secondary | ICD-10-CM | POA: Diagnosis not present

## 2014-08-30 DIAGNOSIS — I517 Cardiomegaly: Secondary | ICD-10-CM | POA: Diagnosis not present

## 2014-08-30 DIAGNOSIS — R945 Abnormal results of liver function studies: Secondary | ICD-10-CM

## 2014-08-30 DIAGNOSIS — I272 Other secondary pulmonary hypertension: Secondary | ICD-10-CM | POA: Diagnosis present

## 2014-08-30 DIAGNOSIS — I420 Dilated cardiomyopathy: Secondary | ICD-10-CM | POA: Diagnosis present

## 2014-08-30 DIAGNOSIS — R161 Splenomegaly, not elsewhere classified: Secondary | ICD-10-CM | POA: Diagnosis not present

## 2014-08-30 DIAGNOSIS — I252 Old myocardial infarction: Secondary | ICD-10-CM | POA: Diagnosis not present

## 2014-08-30 DIAGNOSIS — Z992 Dependence on renal dialysis: Secondary | ICD-10-CM

## 2014-08-30 DIAGNOSIS — D72819 Decreased white blood cell count, unspecified: Secondary | ICD-10-CM

## 2014-08-30 DIAGNOSIS — I5043 Acute on chronic combined systolic (congestive) and diastolic (congestive) heart failure: Principal | ICD-10-CM | POA: Diagnosis present

## 2014-08-30 DIAGNOSIS — D696 Thrombocytopenia, unspecified: Secondary | ICD-10-CM | POA: Diagnosis not present

## 2014-08-30 DIAGNOSIS — D631 Anemia in chronic kidney disease: Secondary | ICD-10-CM | POA: Diagnosis present

## 2014-08-30 DIAGNOSIS — K766 Portal hypertension: Secondary | ICD-10-CM | POA: Diagnosis present

## 2014-08-30 DIAGNOSIS — K7460 Unspecified cirrhosis of liver: Secondary | ICD-10-CM | POA: Diagnosis present

## 2014-08-30 DIAGNOSIS — R600 Localized edema: Secondary | ICD-10-CM | POA: Insufficient documentation

## 2014-08-30 DIAGNOSIS — K8020 Calculus of gallbladder without cholecystitis without obstruction: Secondary | ICD-10-CM | POA: Diagnosis not present

## 2014-08-30 DIAGNOSIS — E871 Hypo-osmolality and hyponatremia: Secondary | ICD-10-CM | POA: Diagnosis present

## 2014-08-30 DIAGNOSIS — R7989 Other specified abnormal findings of blood chemistry: Secondary | ICD-10-CM | POA: Diagnosis not present

## 2014-08-30 DIAGNOSIS — G4733 Obstructive sleep apnea (adult) (pediatric): Secondary | ICD-10-CM | POA: Diagnosis present

## 2014-08-30 DIAGNOSIS — J984 Other disorders of lung: Secondary | ICD-10-CM | POA: Diagnosis not present

## 2014-08-30 DIAGNOSIS — E8809 Other disorders of plasma-protein metabolism, not elsewhere classified: Secondary | ICD-10-CM | POA: Diagnosis not present

## 2014-08-30 DIAGNOSIS — I739 Peripheral vascular disease, unspecified: Secondary | ICD-10-CM | POA: Diagnosis present

## 2014-08-30 DIAGNOSIS — R160 Hepatomegaly, not elsewhere classified: Secondary | ICD-10-CM | POA: Diagnosis not present

## 2014-08-30 DIAGNOSIS — I482 Chronic atrial fibrillation, unspecified: Secondary | ICD-10-CM | POA: Diagnosis present

## 2014-08-30 DIAGNOSIS — K219 Gastro-esophageal reflux disease without esophagitis: Secondary | ICD-10-CM | POA: Diagnosis present

## 2014-08-30 DIAGNOSIS — N2581 Secondary hyperparathyroidism of renal origin: Secondary | ICD-10-CM | POA: Diagnosis present

## 2014-08-30 DIAGNOSIS — R059 Cough, unspecified: Secondary | ICD-10-CM

## 2014-08-30 DIAGNOSIS — J300 Vasomotor rhinitis: Secondary | ICD-10-CM | POA: Diagnosis present

## 2014-08-30 DIAGNOSIS — Z7982 Long term (current) use of aspirin: Secondary | ICD-10-CM | POA: Diagnosis not present

## 2014-08-30 DIAGNOSIS — R140 Abdominal distension (gaseous): Secondary | ICD-10-CM | POA: Diagnosis present

## 2014-08-30 DIAGNOSIS — K659 Peritonitis, unspecified: Secondary | ICD-10-CM | POA: Diagnosis not present

## 2014-08-30 DIAGNOSIS — R109 Unspecified abdominal pain: Secondary | ICD-10-CM

## 2014-08-30 DIAGNOSIS — Z86718 Personal history of other venous thrombosis and embolism: Secondary | ICD-10-CM | POA: Diagnosis not present

## 2014-08-30 DIAGNOSIS — I5041 Acute combined systolic (congestive) and diastolic (congestive) heart failure: Secondary | ICD-10-CM | POA: Diagnosis not present

## 2014-08-30 DIAGNOSIS — D709 Neutropenia, unspecified: Secondary | ICD-10-CM | POA: Diagnosis present

## 2014-08-30 DIAGNOSIS — R509 Fever, unspecified: Secondary | ICD-10-CM | POA: Diagnosis present

## 2014-08-30 DIAGNOSIS — Z87891 Personal history of nicotine dependence: Secondary | ICD-10-CM | POA: Diagnosis not present

## 2014-08-30 DIAGNOSIS — Z86711 Personal history of pulmonary embolism: Secondary | ICD-10-CM

## 2014-08-30 DIAGNOSIS — Z8249 Family history of ischemic heart disease and other diseases of the circulatory system: Secondary | ICD-10-CM | POA: Diagnosis not present

## 2014-08-30 DIAGNOSIS — R609 Edema, unspecified: Secondary | ICD-10-CM | POA: Diagnosis not present

## 2014-08-30 DIAGNOSIS — K769 Liver disease, unspecified: Secondary | ICD-10-CM

## 2014-08-30 DIAGNOSIS — I120 Hypertensive chronic kidney disease with stage 5 chronic kidney disease or end stage renal disease: Secondary | ICD-10-CM | POA: Diagnosis not present

## 2014-08-30 DIAGNOSIS — K7469 Other cirrhosis of liver: Secondary | ICD-10-CM | POA: Diagnosis not present

## 2014-08-30 DIAGNOSIS — J811 Chronic pulmonary edema: Secondary | ICD-10-CM

## 2014-08-30 DIAGNOSIS — R162 Hepatomegaly with splenomegaly, not elsewhere classified: Secondary | ICD-10-CM | POA: Diagnosis not present

## 2014-08-30 DIAGNOSIS — D61818 Other pancytopenia: Secondary | ICD-10-CM | POA: Insufficient documentation

## 2014-08-30 DIAGNOSIS — R1013 Epigastric pain: Secondary | ICD-10-CM | POA: Diagnosis not present

## 2014-08-30 DIAGNOSIS — E559 Vitamin D deficiency, unspecified: Secondary | ICD-10-CM | POA: Diagnosis present

## 2014-08-30 DIAGNOSIS — Z515 Encounter for palliative care: Secondary | ICD-10-CM | POA: Diagnosis not present

## 2014-08-30 DIAGNOSIS — I10 Essential (primary) hypertension: Secondary | ICD-10-CM | POA: Diagnosis present

## 2014-08-30 DIAGNOSIS — R55 Syncope and collapse: Secondary | ICD-10-CM | POA: Insufficient documentation

## 2014-08-30 DIAGNOSIS — R918 Other nonspecific abnormal finding of lung field: Secondary | ICD-10-CM | POA: Diagnosis not present

## 2014-08-30 DIAGNOSIS — N186 End stage renal disease: Secondary | ICD-10-CM | POA: Diagnosis present

## 2014-08-30 DIAGNOSIS — I509 Heart failure, unspecified: Secondary | ICD-10-CM | POA: Diagnosis not present

## 2014-08-30 DIAGNOSIS — I50810 Right heart failure, unspecified: Secondary | ICD-10-CM | POA: Diagnosis present

## 2014-08-30 DIAGNOSIS — R0600 Dyspnea, unspecified: Secondary | ICD-10-CM

## 2014-08-30 DIAGNOSIS — G8929 Other chronic pain: Secondary | ICD-10-CM | POA: Diagnosis not present

## 2014-08-30 DIAGNOSIS — I369 Nonrheumatic tricuspid valve disorder, unspecified: Secondary | ICD-10-CM | POA: Diagnosis not present

## 2014-08-30 DIAGNOSIS — K745 Biliary cirrhosis, unspecified: Secondary | ICD-10-CM | POA: Diagnosis not present

## 2014-08-30 DIAGNOSIS — R188 Other ascites: Secondary | ICD-10-CM | POA: Diagnosis not present

## 2014-08-30 HISTORY — DX: Unspecified atrial flutter: I48.92

## 2014-08-30 HISTORY — DX: Other specified postprocedural states: Z98.890

## 2014-08-30 HISTORY — DX: Personal history of other diseases of the digestive system: Z87.19

## 2014-08-30 LAB — BODY FLUID CELL COUNT WITH DIFFERENTIAL
Lymphs, Fluid: 25 %
Monocyte-Macrophage-Serous Fluid: 72 % (ref 50–90)
Neutrophil Count, Fluid: 3 % (ref 0–25)
Total Nucleated Cell Count, Fluid: 240 uL (ref 0–1000)

## 2014-08-30 LAB — CBC WITH DIFFERENTIAL/PLATELET
BASOS PCT: 0 % (ref 0–1)
Basophils Absolute: 0 10*3/uL (ref 0.0–0.1)
EOS ABS: 0.1 10*3/uL (ref 0.0–0.7)
Eosinophils Relative: 3 % (ref 0–5)
HCT: 38 % — ABNORMAL LOW (ref 39.0–52.0)
HEMOGLOBIN: 13.2 g/dL (ref 13.0–17.0)
LYMPHS ABS: 0.5 10*3/uL — AB (ref 0.7–4.0)
Lymphocytes Relative: 21 % (ref 12–46)
MCH: 28.7 pg (ref 26.0–34.0)
MCHC: 34.7 g/dL (ref 30.0–36.0)
MCV: 82.6 fL (ref 78.0–100.0)
MONO ABS: 0.3 10*3/uL (ref 0.1–1.0)
MONOS PCT: 13 % — AB (ref 3–12)
Neutro Abs: 1.5 10*3/uL — ABNORMAL LOW (ref 1.7–7.7)
Neutrophils Relative %: 63 % (ref 43–77)
Platelets: 74 10*3/uL — ABNORMAL LOW (ref 150–400)
RBC: 4.6 MIL/uL (ref 4.22–5.81)
RDW: 18.3 % — ABNORMAL HIGH (ref 11.5–15.5)
WBC: 2.4 10*3/uL — ABNORMAL LOW (ref 4.0–10.5)

## 2014-08-30 LAB — HEPATITIS PANEL, ACUTE
HCV AB: NEGATIVE
Hep A IgM: NONREACTIVE
Hep B C IgM: NONREACTIVE
Hepatitis B Surface Ag: NEGATIVE

## 2014-08-30 LAB — VITAMIN B12: Vitamin B-12: 1204 pg/mL — ABNORMAL HIGH (ref 211–911)

## 2014-08-30 LAB — COMPREHENSIVE METABOLIC PANEL
ALT: 32 U/L (ref 0–53)
AST: 42 U/L — ABNORMAL HIGH (ref 0–37)
Albumin: 2.8 g/dL — ABNORMAL LOW (ref 3.5–5.2)
Alkaline Phosphatase: 228 U/L — ABNORMAL HIGH (ref 39–117)
Anion gap: 13 (ref 5–15)
BILIRUBIN TOTAL: 2 mg/dL — AB (ref 0.3–1.2)
BUN: 46 mg/dL — ABNORMAL HIGH (ref 6–23)
CHLORIDE: 97 meq/L (ref 96–112)
CO2: 25 mmol/L (ref 19–32)
Calcium: 8.6 mg/dL (ref 8.4–10.5)
Creatinine, Ser: 6.65 mg/dL — ABNORMAL HIGH (ref 0.50–1.35)
GFR calc non Af Amer: 8 mL/min — ABNORMAL LOW (ref >90)
GFR, EST AFRICAN AMERICAN: 9 mL/min — AB (ref >90)
Glucose, Bld: 84 mg/dL (ref 70–99)
Potassium: 4.7 mmol/L (ref 3.5–5.1)
Sodium: 135 mmol/L (ref 135–145)
Total Protein: 7.1 g/dL (ref 6.0–8.3)

## 2014-08-30 LAB — PROTIME-INR
INR: 1.15 (ref 0.00–1.49)
Prothrombin Time: 14.8 seconds (ref 11.6–15.2)

## 2014-08-30 LAB — TROPONIN I
TROPONIN I: 0.04 ng/mL — AB (ref <0.031)
TROPONIN I: 0.03 ng/mL (ref <0.031)
Troponin I: <0.03 ng/mL (ref <0.031)

## 2014-08-30 LAB — I-STAT CG4 LACTIC ACID, ED: Lactic Acid, Venous: 1.93 mmol/L (ref 0.5–2.2)

## 2014-08-30 LAB — LACTATE DEHYDROGENASE, PLEURAL OR PERITONEAL FLUID: LD, Fluid: 118 U/L — ABNORMAL HIGH (ref 3–23)

## 2014-08-30 LAB — GLUCOSE, SEROUS FLUID: Glucose, Fluid: 75 mg/dL

## 2014-08-30 LAB — TSH: TSH: 1.977 u[IU]/mL (ref 0.350–4.500)

## 2014-08-30 LAB — PROTEIN, BODY FLUID: Total protein, fluid: 3.6 g/dL

## 2014-08-30 LAB — CEA: CEA: 1.2 ng/mL (ref 0.0–5.0)

## 2014-08-30 LAB — BRAIN NATRIURETIC PEPTIDE: B NATRIURETIC PEPTIDE 5: 1959.9 pg/mL — AB (ref 0.0–100.0)

## 2014-08-30 LAB — ALBUMIN, FLUID (OTHER): ALBUMIN FL: 1.6 g/dL

## 2014-08-30 MED ORDER — SEVELAMER CARBONATE 800 MG PO TABS: 2400.0000 mg | ORAL_TABLET | ORAL | Status: DC

## 2014-08-30 MED ORDER — MORPHINE SULFATE 2 MG/ML IJ SOLN: 1.0000 mg | INTRAMUSCULAR | Status: DC | PRN

## 2014-08-30 MED ORDER — LABETALOL HCL 200 MG PO TABS
200.0000 mg | ORAL_TABLET | Freq: Two times a day (BID) | ORAL | Status: DC
  Administered 2014-08-30 – 2014-09-03 (×6): 200 mg via ORAL
  Filled 2014-08-30 (×12): qty 1

## 2014-08-30 MED ORDER — SEVELAMER CARBONATE 800 MG PO TABS
3200.0000 mg | ORAL_TABLET | Freq: Three times a day (TID) | ORAL | Status: DC
  Administered 2014-08-31 – 2014-09-03 (×8): 3200 mg via ORAL
  Filled 2014-08-30 (×18): qty 4

## 2014-08-30 MED ORDER — ALTEPLASE 2 MG IJ SOLR: 2.0000 mg | Freq: Once | INTRAMUSCULAR | Status: DC | PRN

## 2014-08-30 MED ORDER — SODIUM CHLORIDE 0.9 % IV SOLN: 100.0000 mL | INTRAVENOUS | Status: DC | PRN

## 2014-08-30 MED ORDER — ACETAMINOPHEN 650 MG RE SUPP: 650.0000 mg | Freq: Four times a day (QID) | RECTAL | Status: DC | PRN

## 2014-08-30 MED ORDER — OXYCODONE HCL 5 MG PO TABS
5.0000 mg | ORAL_TABLET | ORAL | Status: DC | PRN
  Administered 2014-09-02 – 2014-09-08 (×15): 5 mg via ORAL
  Filled 2014-08-30 (×14): qty 1

## 2014-08-30 MED ORDER — SODIUM CHLORIDE 0.9 % IJ SOLN
3.0000 mL | Freq: Two times a day (BID) | INTRAMUSCULAR | Status: DC
  Administered 2014-08-30 – 2014-09-12 (×23): 3 mL via INTRAVENOUS

## 2014-08-30 MED ORDER — SODIUM CHLORIDE 0.9 % IJ SOLN: 3.0000 mL | INTRAMUSCULAR | Status: DC | PRN

## 2014-08-30 MED ORDER — ACETAMINOPHEN 325 MG PO TABS
650.0000 mg | ORAL_TABLET | Freq: Four times a day (QID) | ORAL | Status: DC | PRN
  Administered 2014-09-04: 650 mg via ORAL
  Filled 2014-08-30: qty 2

## 2014-08-30 MED ORDER — LIDOCAINE HCL (PF) 1 % IJ SOLN: 5.0000 mL | INTRAMUSCULAR | Status: DC | PRN

## 2014-08-30 MED ORDER — LIDOCAINE-PRILOCAINE 2.5-2.5 % EX CREA: 1.0000 "application " | TOPICAL_CREAM | CUTANEOUS | Status: DC | PRN

## 2014-08-30 MED ORDER — ASPIRIN EC 81 MG PO TBEC
81.0000 mg | DELAYED_RELEASE_TABLET | Freq: Every day | ORAL | Status: DC
  Administered 2014-08-30 – 2014-09-08 (×10): 81 mg via ORAL
  Filled 2014-08-30 (×16): qty 1

## 2014-08-30 MED ORDER — NEPRO/CARBSTEADY PO LIQD
237.0000 mL | Freq: Every day | ORAL | Status: DC
  Administered 2014-08-31 – 2014-09-03 (×5): 237 mL via ORAL
  Filled 2014-08-30: qty 237

## 2014-08-30 MED ORDER — PENTAFLUOROPROP-TETRAFLUOROETH EX AERO: 1.0000 "application " | INHALATION_SPRAY | CUTANEOUS | Status: DC | PRN

## 2014-08-30 MED ORDER — SODIUM CHLORIDE 0.9 % IV SOLN: 250.0000 mL | INTRAVENOUS | Status: DC | PRN

## 2014-08-30 MED ORDER — ALBUTEROL SULFATE (2.5 MG/3ML) 0.083% IN NEBU
2.5000 mg | INHALATION_SOLUTION | RESPIRATORY_TRACT | Status: DC | PRN
  Administered 2014-09-02: 2.5 mg via RESPIRATORY_TRACT
  Filled 2014-08-30 (×2): qty 3

## 2014-08-30 MED ORDER — LIDOCAINE HCL (PF) 1 % IJ SOLN
INTRAMUSCULAR | Status: AC
  Filled 2014-08-30: qty 10

## 2014-08-30 MED ORDER — NEPRO/CARBSTEADY PO LIQD: 237.0000 mL | ORAL | Status: DC | PRN

## 2014-08-30 MED ORDER — RENA-VITE PO TABS
1.0000 | ORAL_TABLET | Freq: Every day | ORAL | Status: DC
  Administered 2014-08-31 – 2014-09-11 (×13): 1 via ORAL
  Filled 2014-08-30 (×14): qty 1

## 2014-08-30 MED ORDER — ONDANSETRON HCL 4 MG/2ML IJ SOLN: 4.0000 mg | Freq: Four times a day (QID) | INTRAMUSCULAR | Status: DC | PRN

## 2014-08-30 MED ORDER — CETYLPYRIDINIUM CHLORIDE 0.05 % MT LIQD
7.0000 mL | Freq: Two times a day (BID) | OROMUCOSAL | Status: DC
  Administered 2014-08-31 – 2014-09-11 (×21): 7 mL via OROMUCOSAL

## 2014-08-30 MED ORDER — DOXERCALCIFEROL 4 MCG/2ML IV SOLN: 3.0000 ug | INTRAVENOUS | Status: DC

## 2014-08-30 MED ORDER — TRIAMCINOLONE ACETONIDE 0.5 % EX OINT
1.0000 "application " | TOPICAL_OINTMENT | Freq: Every day | CUTANEOUS | Status: DC
  Administered 2014-08-31 – 2014-09-12 (×9): 1 via TOPICAL
  Filled 2014-08-30 (×4): qty 15

## 2014-08-30 MED ORDER — IOHEXOL 300 MG/ML  SOLN
80.0000 mL | Freq: Once | INTRAMUSCULAR | Status: AC | PRN
  Administered 2014-08-30: 80 mL via INTRAVENOUS

## 2014-08-30 MED ORDER — SODIUM CHLORIDE 0.9 % IJ SOLN
3.0000 mL | Freq: Two times a day (BID) | INTRAMUSCULAR | Status: DC
  Administered 2014-09-05 – 2014-09-12 (×3): 3 mL via INTRAVENOUS

## 2014-08-30 MED ORDER — ONDANSETRON HCL 4 MG PO TABS: 4.0000 mg | ORAL_TABLET | Freq: Four times a day (QID) | ORAL | Status: DC | PRN

## 2014-08-30 MED ORDER — SEVELAMER CARBONATE 800 MG PO TABS
2400.0000 mg | ORAL_TABLET | ORAL | Status: DC | PRN
  Administered 2014-08-31: 2400 mg via ORAL
  Filled 2014-08-30 (×2): qty 3

## 2014-08-30 MED ORDER — IOHEXOL 300 MG/ML  SOLN
50.0000 mL | Freq: Once | INTRAMUSCULAR | Status: AC | PRN
  Administered 2014-08-30: 50 mL via ORAL

## 2014-08-30 NOTE — Progress Notes (Signed)
*  PRELIMINARY RESULTS* Vascular Ultrasound Lower extremity venous duplex has been completed.  Preliminary findings: no evidence of DVT. Venous waveforms appear pulsatile, which is suggestive of increased right sided heart pressure.    Farrel Demark, RDMS, RVT  08/30/2014, 2:27 PM

## 2014-08-30 NOTE — ED Notes (Signed)
Report given to [address] [employer]. Tech to transport to [employer]

## 2014-08-30 NOTE — Procedures (Signed)
US guided para  1.2 liters: amber fluid Sent for labs per MD  Pt tolerated well

## 2014-08-30 NOTE — Progress Notes (Signed)
Attempted to call for report. Nurse stated she will call me back.

## 2014-08-30 NOTE — Consult Note (Signed)
Referring Provider: No ref. provider found Primary Care Physician:  [name], MD Primary Nephrologist:    Reason for Consultation:  Medical management of ESRD  Anemia and secondary hyperparathyroidism HPI: ESRD MWF dialysis that presented to the ER with shortness of breath, He also had abdominal rigidity on examination and consideration for abdominal emergency is being considered He is to get a CT scan of his abdomen. He incidentally had a similar complaint after the laparoscopic repair of a hernia in July. He underwent a CT scan at that time and it did not reveal any obvious pathological source.   Past Medical History  Diagnosis Date  . Anemia   . AVF (arteriovenous fistula)     Left  . Secondary hyperparathyroidism   . Hypovitaminosis D   . Hypertensive urgency     H/o  . CHF (congestive heart failure)   . Exertional shortness of breath     "related to infection in my lungs right now" (05/03/2013)  . History of gout     "before I started doing the dialysis" (05/03/2013)  . Sleep apnea   . GERD (gastroesophageal reflux disease)   . Syncope     felt secondary to residual anesthesia the day before - 2D echo unremarkable  . Pulmonary embolism     with right DVT secondary to recent surgery  . Atrial fibrillation     not on coumadin due to large spontaneous hematoma on back and anemia  . Myocardial infarction 90's  . Coronary artery disease   . Dysrhythmia     afib  . Peripheral vascular disease     dvt leg 12/14  . Hypertension   . ESRD (end stage renal disease) on dialysis     [facility] mon/wed/fri    Past Surgical History  Procedure Laterality Date  . Av fistula placement Left     Dr. Charlean Sanfilippo; "I've had 2 on the left' (05/03/2013)  . Av fistula placement Right ~ 2011  . Knee arthroscopy Left   . Avgg removal Right 05/04/2013    Procedure: REMOVAL OF ARTERIOVENOUS Fistula Right Arm;  Surgeon: [name], MD;  Location: [facility] OR;  Service: Vascular;  Laterality:  Right;  . Insertion of dialysis catheter Right 05/04/2013    Procedure: INSERTION OF DIALYSIS CATHETER;  Surgeon: [name], MD;  Location: [facility] OR;  Service: Vascular;  Laterality: Right;  . Colonoscopy      Hx: of  . Bascilic vein transposition Left 06/27/2013    Procedure: BASCILIC VEIN TRANSPOSITION;  Surgeon: [name], MD;  Location: [facility] OR;  Service: Vascular;  Laterality: Left;  . Cardioversion N/A 08/29/2013    Procedure: CARDIOVERSION;  Surgeon: [name], MD;  Location: [facility] ENDOSCOPY;  Service: Cardiovascular;  Laterality: N/A;  . Removal of a dialysis catheter  2/15  . Hernia repair  03/21/14    Umbilical hernia-Dr. Derrell Lolling  . Umbilical hernia repair N/A 03/21/2014    Procedure: LAPAROSCOPIC UMBILICAL HERNIA REPAIR WITH MESH;  Surgeon: Axel Filler, MD;  Location: [facility] OR;  Service: General;  Laterality: N/A;  . Insertion of mesh N/A 03/21/2014    Procedure: INSERTION OF MESH;  Surgeon: Axel Filler, MD;  Location: [facility] OR;  Service: General;  Laterality: N/A;  . Venogram Left 05/26/2013    Procedure: VENOGRAM;  Surgeon: Fransisco Hertz, MD;  Location: [facility] CATH LAB;  Service: Cardiovascular;  Laterality: Left;    Prior to Admission medications   Medication Sig Start Date End Date Taking?  Authorizing Provider  amLODipine (NORVASC) 10 MG tablet Take 10 mg by mouth daily.     Yes Historical Provider, MD  aspirin EC 81 MG tablet Take 1 tablet (81 mg total) by mouth daily. 09/06/13  Yes Penny Pia, MD  labetalol (NORMODYNE) 200 MG tablet Take 200 mg by mouth 2 (two) times daily.   Yes Historical Provider, MD  multivitamin (RENA-VIT) TABS tablet Take 1 tablet by mouth daily. 08/01/14  Yes Historical Provider, MD  Nutritional Supplements (NEPRO) LIQD Take by mouth daily.   Yes Historical Provider, MD  sevelamer carbonate (RENVELA) 800 MG tablet Take 2,400-3,200 mg by mouth See admin instructions. 3200mg  with each meal, 2400-3200mg  with each snack   Yes Historical Provider,  MD  vitamin C (ASCORBIC ACID) 500 MG tablet Take 500-1,500 mg by mouth as needed.    Yes Historical Provider, MD  triamcinolone ointment (KENALOG) 0.5 % Apply 0.5 application topically daily. 08/18/14   Historical Provider, MD    Current Facility-Administered Medications  Medication Dose Route Frequency Provider Last Rate Last Dose  . 0.9 %  sodium chloride infusion  250 mL Intravenous PRN Osvaldo Shipper, MD      . acetaminophen (TYLENOL) tablet 650 mg  650 mg Oral Q6H PRN Osvaldo Shipper, MD       Or  . acetaminophen (TYLENOL) suppository 650 mg  650 mg Rectal Q6H PRN Osvaldo Shipper, MD      . albuterol (PROVENTIL) (2.5 MG/3ML) 0.083% nebulizer solution 2.5 mg  2.5 mg Nebulization Q2H PRN Osvaldo Shipper, MD      . antiseptic oral rinse (CPC / CETYLPYRIDINIUM CHLORIDE 0.05%) solution 7 mL  7 mL Mouth Rinse BID Osvaldo Shipper, MD      . aspirin EC tablet 81 mg  81 mg Oral Daily Osvaldo Shipper, MD   81 mg at 08/30/14 1117  . labetalol (NORMODYNE) tablet 200 mg  200 mg Oral BID Osvaldo Shipper, MD   200 mg at 08/30/14 1117  . lidocaine (PF) (XYLOCAINE) 1 % injection           . morphine 2 MG/ML injection 1 mg  1 mg Intravenous Q4H PRN Osvaldo Shipper, MD      . multivitamin (RENA-VIT) tablet 1 tablet  1 tablet Oral QHS Osvaldo Shipper, MD      . ondansetron [facility]) tablet 4 mg  4 mg Oral Q6H PRN Osvaldo Shipper, MD       Or  . ondansetron (ZOFRAN) injection 4 mg  4 mg Intravenous Q6H PRN Osvaldo Shipper, MD      . oxyCODONE (Oxy IR/ROXICODONE) immediate release tablet 5 mg  5 mg Oral Q4H PRN Osvaldo Shipper, MD      . sevelamer carbonate (RENVELA) tablet 2,400 mg  2,400 mg Oral PRN Osvaldo Shipper, MD      . sevelamer carbonate (RENVELA) tablet 3,200 mg  3,200 mg Oral TID WC Osvaldo Shipper, MD   3,200 mg at 08/30/14 1117  . sodium chloride 0.9 % injection 3 mL  3 mL Intravenous Q12H Osvaldo Shipper, MD   0 mL at 08/30/14 1000  . sodium chloride 0.9 % injection 3 mL  3 mL Intravenous Q12H Osvaldo Shipper, MD   3 mL at 08/30/14 1016  . sodium chloride 0.9 % injection 3 mL  3 mL Intravenous PRN Osvaldo Shipper, MD      . triamcinolone ointment (KENALOG) 0.5 % 1 application  1 application Topical Daily Osvaldo Shipper, MD        Allergies  as of 08/30/2014 - Review Complete 08/30/2014  Allergen Reaction Noted  . Pork-derived products Nausea And Vomiting 03/14/2014    Family History  Problem Relation Age of Onset  . Hypertension Father   . Kidney disease Father   . Allergies Father   . Deep vein thrombosis Sister   . Pulmonary embolism Sister   . Diabetes Paternal Grandmother     History   Social History  . Marital Status: Single    Spouse Name: N/A    Number of Children: 4  . Years of Education: N/A   Occupational History  . Disabled    Social History Main Topics  . Smoking status: Former Smoker -- 0.25 packs/day for .5 years    Types: Cigarettes    Quit date: 09/08/1972  . Smokeless tobacco: Never Used  . Alcohol Use: Yes     Comment: 05/03/2013 "haven't had a glass of wine in ~ 3 months or so; sometimes will have one w/dinner"  . Drug Use: No  . Sexual Activity: Yes   Other Topics Concern  . Not on file   Social History Narrative   Former smoker- quit over 30 yrs ago   Patient is on disability    Review of Systems: Gen: Denies any fever, chills, sweats, anorexia, fatigue, weakness, malaise, weight loss, and sleep disorder HEENT: No visual complaints, No history of Retinopathy. Normal external appearance No Epistaxis or Sore throat. No sinusitis.   CV: Denies chest pain, angina, palpitations, syncope, orthopnea, PND, peripheral edema, and claudication. Resp: Denies dyspnea at rest, dyspnea with exercise, cough, sputum, wheezing, coughing up blood, and pleurisy. GI: Denies vomiting blood, jaundice, and fecal incontinence.   Denies dysphagia or odynophagia. GU : Denies urinary burning, blood in urine, urinary frequency, urinary hesitancy, nocturnal urination,  and urinary incontinence.  No renal calculi. MS: Denies joint pain, limitation of movement, and swelling, stiffness, low back pain, extremity pain. Denies muscle weakness, cramps, atrophy.  No use of non steroidal antiinflammatory drugs. Derm: Denies rash, itching, dry skin, hives, moles, warts, or unhealing ulcers.  Psych: Denies depression, anxiety, memory loss, suicidal ideation, hallucinations, paranoia, and confusion. Heme: Denies bruising, bleeding, and enlarged lymph nodes. Neuro: No headache.  No diplopia. No dysarthria.  No dysphasia.  No history of CVA.  No Seizures. No paresthesias.  No weakness. Endocrine No DM.  No Thyroid disease.  No Adrenal disease.  Physical Exam: Vital signs in last 24 hours: Temp:  [98 F (36.7 C)-98.1 F (36.7 C)] 98 F (36.7 C) (12/23 0950) Pulse Rate:  [33-110] 110 (12/23 1117) Resp:  [14-33] 22 (12/23 0950) BP: (111-153)/(89-125) 125/97 mmHg (12/23 1325) SpO2:  [88 %-100 %] 99 % (12/23 0950) Weight:  [100.3 kg (221 lb 1.9 oz)] 100.3 kg (221 lb 1.9 oz) (12/23 0950) Last BM Date: 08/30/14 General:   Chronically ill appearing male with dyspnea Head:  Normocephalic and atraumatic. Eyes:  Sclera clear, no icterus.   Conjunctiva pink. Ears:  Normal auditory acuity. Nose:  No deformity, discharge,  or lesions. Mouth:  No deformity or lesions, dentition normal. Neck:  Supple; no masses or thyromegaly. JVP not elevated Lungs:  Clear throughout to auscultation.   No wheezes, crackles, or rhonchi. No acute distress. Heart:  Regular rate and rhythm; no murmurs, clicks, rubs,  or gallops. Abdomen:  distended, firm. Hepatomegaly noted. Difficult to ascertain ascites secondary to firm abdomen. Abdomen is tender  Msk:  Symmetrical without gross deformities. Normal posture. Pulses:  No carotid, renal, femoral bruits. DP  and PT symmetrical and equal Extremities:  Without clubbing or edema. Neurologic:  Alert and  oriented x4;  grossly normal  neurologically. Skin: Rash (diffuse areas of scarring, few open wounds to abdomen and chest secondary excoriation and picking noted) noted. Cervical Nodes:  No significant cervical adenopathy. Psych:  Alert and cooperative. Normal mood and affect.  Intake/Output from previous day:   Intake/Output this shift:    Lab Results:  Recent Labs  08/30/14 0402  WBC 2.4*  HGB 13.2  HCT 38.0*  PLT 74*   BMET  Recent Labs  08/30/14 0402  NA 135  K 4.7  CL 97  CO2 25  GLUCOSE 84  BUN 46*  CREATININE 6.65*  CALCIUM 8.6   LFT  Recent Labs  08/30/14 0402  PROT 7.1  ALBUMIN 2.8*  AST 42*  ALT 32  ALKPHOS 228*  BILITOT 2.0*   PT/INR  Recent Labs  08/30/14 0651  LABPROT 14.8  INR 1.15   Hepatitis Panel No results for input(s): HEPBSAG, HCVAB, HEPAIGM, HEPBIGM in the last 72 hours.  Studies/Results: Ct Abdomen Pelvis Wo Contrast  08/30/2014   CLINICAL DATA:  Worsening abdominal distention and generalized abdominal pain, chronic in nature. Initial encounter.  EXAM: CT ABDOMEN AND PELVIS WITHOUT CONTRAST  TECHNIQUE: Multidetector CT imaging of the abdomen and pelvis was performed following the standard protocol without IV contrast.  COMPARISON:  CT of the abdomen and pelvis from 03/26/2014  FINDINGS: Mild patchy bilateral atelectasis is noted. Diffuse coronary artery calcifications are seen. Scattered calcified enlarged hilar and mediastinal nodes are again seen, measuring up to 3.0 cm in short axis. Cardiomegaly is noted.  There is significantly increased moderate to large volume ascites within the abdomen and pelvis.  Hepatomegaly is again noted. A 1.9 cm focus of decreased attenuation within the right hepatic lobe, with overlying retraction of the hepatic capsule, appears to have increased in size from the prior study. Malignancy cannot be excluded. Evaluation for additional hepatic masses is limited without contrast. The spleen is enlarged, as on the prior study, measuring  13.8 cm in length.  The pancreas and adrenal glands are unremarkable in appearance.  Severe bilateral renal atrophy is noted, with a few scattered renal cysts. Nonspecific perinephric stranding is noted bilaterally. There is no evidence of hydronephrosis. No renal or ureteral stones are seen.  The small bowel is unremarkable in appearance. The stomach is within normal limits. No acute vascular abnormalities are seen. Scattered calcification is seen along the abdominal aorta and its branches. The vasculature is difficult to fully assess without contrast.  The appendix is not definitely characterized given surrounding ascites. The colon is partially filled with stool and is grossly unremarkable in appearance, though difficult to fully characterize due to surrounding ascites.  The bladder is mildly distended and unremarkable in appearance. The prostate remains normal in size, with scattered calcification. Mildly enlarged bilateral inguinal nodes are seen, measuring up to 1.5 cm; some of this may reflect underlying soft tissue edema.  Diffuse soft tissue edema is again noted, raising concern for anasarca. This is mildly improved from the prior study.  No acute osseous abnormalities are identified. Diffuse uniform sclerotic change within the visualized osseous structures reflects renal osteodystrophy.  IMPRESSION: 1. Significantly increased moderate to large volume abdominopelvic ascites. 2. 1.9 cm focus of decreased attenuation within the right hepatic lobe, with overlying retraction of the hepatic capsule, appears to have increased in size from the prior study. Malignancy cannot be excluded. Would correlate with LFTs  and consider dynamic liver protocol CT for further evaluation. 3. Splenomegaly and hepatomegaly noted. 4. Cardiomegaly noted.  Diffuse coronary artery calcifications seen. 5. Scattered calcified enlarged hilar and mediastinal nodes again noted, measuring up to 3.0 cm in short axis. These were present in  2014, and thought to reflect remote granulomatous disease. 6. Mild patchy bilateral atelectasis noted. 7. Severe bilateral renal atrophy, with a few scattered renal cysts. 8. Scattered calcification along the abdominal aorta and its branches. 9. Mildly enlarged bilateral inguinal nodes, measuring up to 1.5 cm in short axis. This may partially reflect underlying soft tissue edema. 10. Diffuse soft tissue edema, raising concern for anasarca; this is mildly improved from the prior study. 11. Findings of renal osteodystrophy.   Electronically Signed   By: Roanna RaiderJeffery  Chang M.D.   On: 08/30/2014 07:44   Dg Chest 2 View  08/30/2014   CLINICAL DATA:  Acute onset of shortness of breath and abdominal distention. Initial encounter.  EXAM: CHEST  2 VIEW  COMPARISON:  Chest radiograph performed 01/16/2014, and CT of the chest performed 09/01/2013  FINDINGS: The lungs are hypoexpanded. Bibasilar airspace opacities may reflect multifocal pneumonia or pulmonary edema. There is no evidence of pleural effusion or pneumothorax.  The heart is enlarged.  No acute osseous abnormalities are seen.  IMPRESSION: Lungs hypoexpanded. Bibasilar airspace opacities may reflect multifocal pneumonia or mild pulmonary edema. Cardiomegaly noted.   Electronically Signed   By: Roanna RaiderJeffery  Chang M.D.   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

## 2014-08-30 NOTE — H&P (Signed)
[facility] History and Physical  Trevor [name] CWU:[id number] DOB: 01/13/1951 DOA: 08/30/2014   PCP: Maximino Greenland, MD  Specialists: Dr. Mercy Moore is his nephrologist. Dr. Fransico Him is his cardiologist.  Chief Complaint: Abdominal discomfort  HPI: Trevor [name] is a 63 y.o. male with a past medical history of end-stage renal disease on hemodialysis on Monday, Wednesday, Friday, history of atrial fibrillation, not on anticoagulation, history of hypertension who was in his usual state of health until a few days ago when he started developing worsening abdominal pain. He tells me that he had a umbilical hernia repair in July and ever since has had some form of abdominal discomfort. But the pain and the distention have gotten worse in the last few weeks. The pain is currently 8 out of 10. It is diffuse. He has been a little short of breath due to the distention. Denies any nausea, vomiting or diarrhea. He thinks he may have lost weight, but is unsure. Denies any chest pain. No fever, no chills. He's been coughing a lot recently, and after a vigorous coughing spell this morning, he passed out. Denies any blood in the stool. Denies any blood in the urine. No constipation. He has had itching in his back at the site of his skin bumps. He was placed on a prednisone Dosepak and some cream recently by his primary care provider. He saw his cardiologist yesterday who recommended that he seek attention for his abdominal pain. So, he showed up early this morning in the ED.  Home Medications: Prior to Admission medications   Medication Sig Start Date End Date Taking? Authorizing Provider  amLODipine (NORVASC) 10 MG tablet Take 10 mg by mouth daily.     Yes Historical Provider, MD  aspirin EC 81 MG tablet Take 1 tablet (81 mg total) by mouth daily. 09/06/13  Yes Velvet Bathe, MD  labetalol (NORMODYNE) 200 MG tablet Take 200 mg by mouth 2 (two) times daily.   Yes Historical Provider, MD  multivitamin  (RENA-VIT) TABS tablet Take 1 tablet by mouth daily. 08/01/14  Yes Historical Provider, MD  Nutritional Supplements (NEPRO) LIQD Take by mouth daily.   Yes Historical Provider, MD  sevelamer carbonate (RENVELA) 800 MG tablet Take 2,400-3,200 mg by mouth See admin instructions. 3268m with each meal, 2400-32086mwith each snack   Yes Historical Provider, MD  vitamin C (ASCORBIC ACID) 500 MG tablet Take 500-1,500 mg by mouth as needed.    Yes Historical Provider, MD  triamcinolone ointment (KENALOG) 0.5 % Apply 0.5 application topically daily. 08/18/14   Historical Provider, MD    Allergies:  Allergies  Allergen Reactions  . Pork-Derived Products Nausea And Vomiting    Culture    Past Medical History: Past Medical History  Diagnosis Date  . Anemia   . AVF (arteriovenous fistula)     Left  . Secondary hyperparathyroidism   . Hypovitaminosis D   . Hypertensive urgency     H/o  . CHF (congestive heart failure)   . Exertional shortness of breath     "related to infection in my lungs right now" (05/03/2013)  . History of gout     "before I started doing the dialysis" (05/03/2013)  . Sleep apnea   . GERD (gastroesophageal reflux disease)   . Syncope     felt secondary to residual anesthesia the day before - 2D echo unremarkable  . Pulmonary embolism     with right DVT secondary to recent surgery  . Atrial fibrillation  not on coumadin due to large spontaneous hematoma on back and anemia  . Myocardial infarction 90's  . Coronary artery disease   . Dysrhythmia     afib  . Peripheral vascular disease     dvt leg 12/14  . Hypertension   . ESRD (end stage renal disease) on dialysis     [facility] mon/wed/fri    Past Surgical History  Procedure Laterality Date  . Av fistula placement Left     Dr. Su Grand; "I've had 2 on the left' (05/03/2013)  . Av fistula placement Right ~ 2011  . Knee arthroscopy Left   . Avgg removal Right 05/04/2013    Procedure: REMOVAL OF ARTERIOVENOUS  Fistula Right Arm;  Surgeon: Elam Dutch, MD;  Location: [place];  Service: Vascular;  Laterality: Right;  . Insertion of dialysis catheter Right 05/04/2013    Procedure: INSERTION OF DIALYSIS CATHETER;  Surgeon: Elam Dutch, MD;  Location: [place];  Service: Vascular;  Laterality: Right;  . Colonoscopy      Hx: of  . Bascilic vein transposition Left 06/27/2013    Procedure: BASCILIC VEIN TRANSPOSITION;  Surgeon: Elam Dutch, MD;  Location: [place];  Service: Vascular;  Laterality: Left;  . Cardioversion N/A 08/29/2013    Procedure: CARDIOVERSION;  Surgeon: Sueanne Margarita, MD;  Location: [place];  Service: Cardiovascular;  Laterality: N/A;  . Removal of a dialysis catheter  2/15  . Hernia repair  6/80/32    Umbilical hernia-Dr. Rosendo Gros  . Umbilical hernia repair N/A 03/21/2014    Procedure: LAPAROSCOPIC UMBILICAL HERNIA REPAIR WITH MESH;  Surgeon: Ralene Ok, MD;  Location: [place];  Service: General;  Laterality: N/A;  . Insertion of mesh N/A 03/21/2014    Procedure: INSERTION OF MESH;  Surgeon: Ralene Ok, MD;  Location: [place];  Service: General;  Laterality: N/A;  . Venogram Left 05/26/2013    Procedure: VENOGRAM;  Surgeon: Conrad [place], MD;  Location: [facility] CATH [facility];  Service: Cardiovascular;  Laterality: Left;    Social History: He lives in [place] with his girlfriend. Quit smoking about 30-40 years ago. Occasional alcohol intake. Denies recreational drug use. Independent with his daily activities.  Family History:  Family History  Problem Relation Age of Onset  . Hypertension Father   . Kidney disease Father   . Allergies Father   . Deep vein thrombosis Sister   . Pulmonary embolism Sister   . Diabetes Paternal Grandmother      Review of Systems - History obtained from the patient General ROS: positive for  - fatigue Psychological ROS: negative Ophthalmic ROS: negative ENT ROS: negative Allergy and Immunology ROS: negative Hematological and Lymphatic  ROS: negative Endocrine ROS: negative Respiratory ROS: no cough, shortness of breath, or wheezing Cardiovascular ROS: no chest pain or dyspnea on exertion Gastrointestinal ROS: no abdominal pain, change in bowel habits, or black or bloody stools Genito-Urinary ROS: no dysuria, trouble voiding, or hematuria Musculoskeletal ROS: negative Neurological ROS: no TIA or stroke symptoms Dermatological ROS: negative  Physical Examination  Filed Vitals:   08/30/14 0615 08/30/14 0700 08/30/14 0800 08/30/14 0815  BP: 132/101 131/107 135/95 111/89  Pulse: 98 89 99 83  Temp:      TempSrc:      Resp: 33 '29 29 21  ' SpO2: 94% 88% 97% 99%    BP 111/89 mmHg  Pulse 83  Temp(Src) 98.1 F (36.7 C) (Oral)  Resp 21  SpO2 99%  General appearance: alert, cooperative, appears stated  age and no distress Head: Normocephalic, without obvious abnormality, atraumatic Eyes: conjunctivae/corneas clear. PERRL, EOM's intact.  Throat: lips, mucosa, and tongue normal; teeth and gums normal Neck: no adenopathy, no carotid bruit, no JVD, supple, symmetrical, trachea midline and thyroid not enlarged, symmetric, no tenderness/mass/nodules Resp: clear to auscultation bilaterally Cardio: regular rate and rhythm, S1, S2 normal, no murmur, click, rub or gallop GI: soft, non-tender; bowel sounds normal; no masses,  no organomegaly Extremities: 1+ pitting edema bilateral lower extremities Pulses: 2+ and symmetric Skin: Skin color, texture, turgor normal. No rashes or lesions Neurologic: Alert and oriented 3. No focal neurological deficits are noted.  Laboratory Data: Results for orders placed or performed during the hospital encounter of 08/30/14 (from the past 48 hour(s))  Comprehensive metabolic panel     Status: Abnormal   Collection Time: 08/30/14  4:02 AM  Result Value Ref Range   Sodium 135 135 - 145 mmol/L    Comment: Please note change in reference range.   Potassium 4.7 3.5 - 5.1 mmol/L    Comment: Please  note change in reference range.   Chloride 97 96 - 112 mEq/L   CO2 25 19 - 32 mmol/L   Glucose, Bld 84 70 - 99 mg/dL   BUN 46 (H) 6 - 23 mg/dL   Creatinine, Ser 6.65 (H) 0.50 - 1.35 mg/dL   Calcium 8.6 8.4 - 10.5 mg/dL   Total Protein 7.1 6.0 - 8.3 g/dL   Albumin 2.8 (L) 3.5 - 5.2 g/dL   AST 42 (H) 0 - 37 U/L   ALT 32 0 - 53 U/L   Alkaline Phosphatase 228 (H) 39 - 117 U/L   Total Bilirubin 2.0 (H) 0.3 - 1.2 mg/dL   GFR calc non Af Amer 8 (L) >90 mL/min   GFR calc Af Amer 9 (L) >90 mL/min    Comment: (NOTE) The eGFR has been calculated using the CKD EPI equation. This calculation has not been validated in all clinical situations. eGFR's persistently <90 mL/min signify possible Chronic Kidney Disease.    Anion gap 13 5 - 15  CBC with Differential     Status: Abnormal   Collection Time: 08/30/14  4:02 AM  Result Value Ref Range   WBC 2.4 (L) 4.0 - 10.5 K/uL   RBC 4.60 4.22 - 5.81 MIL/uL   Hemoglobin 13.2 13.0 - 17.0 g/dL   HCT 38.0 (L) 39.0 - 52.0 %   MCV 82.6 78.0 - 100.0 fL   MCH 28.7 26.0 - 34.0 pg   MCHC 34.7 30.0 - 36.0 g/dL   RDW 18.3 (H) 11.5 - 15.5 %   Platelets 74 (L) 150 - 400 K/uL    Comment: REPEATED TO VERIFY PLATELET COUNT CONFIRMED BY SMEAR    Neutrophils Relative % 63 43 - 77 %   Neutro Abs 1.5 (L) 1.7 - 7.7 K/uL   Lymphocytes Relative 21 12 - 46 %   Lymphs Abs 0.5 (L) 0.7 - 4.0 K/uL   Monocytes Relative 13 (H) 3 - 12 %   Monocytes Absolute 0.3 0.1 - 1.0 K/uL   Eosinophils Relative 3 0 - 5 %   Eosinophils Absolute 0.1 0.0 - 0.7 K/uL   Basophils Relative 0 0 - 1 %   Basophils Absolute 0.0 0.0 - 0.1 K/uL  Brain natriuretic peptide     Status: Abnormal   Collection Time: 08/30/14  4:02 AM  Result Value Ref Range   B Natriuretic Peptide 1959.9 (H) 0.0 - 100.0 pg/mL  Comment: Please note change in reference range.  I-Stat CG4 Lactic Acid, ED     Status: None   Collection Time: 08/30/14  4:12 AM  Result Value Ref Range   Lactic Acid, Venous 1.93 0.5 -  2.2 mmol/L  Protime-INR     Status: None   Collection Time: 08/30/14  6:51 AM  Result Value Ref Range   Prothrombin Time 14.8 11.6 - 15.2 seconds   INR 1.15 0.00 - 1.49    Radiology Reports: Ct Abdomen Pelvis Wo Contrast  08/30/2014   CLINICAL DATA:  Worsening abdominal distention and generalized abdominal pain, chronic in nature. Initial encounter.  EXAM: CT ABDOMEN AND PELVIS WITHOUT CONTRAST  TECHNIQUE: Multidetector CT imaging of the abdomen and pelvis was performed following the standard protocol without IV contrast.  COMPARISON:  CT of the abdomen and pelvis from 03/26/2014  FINDINGS: Mild patchy bilateral atelectasis is noted. Diffuse coronary artery calcifications are seen. Scattered calcified enlarged hilar and mediastinal nodes are again seen, measuring up to 3.0 cm in short axis. Cardiomegaly is noted.  There is significantly increased moderate to large volume ascites within the abdomen and pelvis.  Hepatomegaly is again noted. A 1.9 cm focus of decreased attenuation within the right hepatic lobe, with overlying retraction of the hepatic capsule, appears to have increased in size from the prior study. Malignancy cannot be excluded. Evaluation for additional hepatic masses is limited without contrast. The spleen is enlarged, as on the prior study, measuring 13.8 cm in length.  The pancreas and adrenal glands are unremarkable in appearance.  Severe bilateral renal atrophy is noted, with a few scattered renal cysts. Nonspecific perinephric stranding is noted bilaterally. There is no evidence of hydronephrosis. No renal or ureteral stones are seen.  The small bowel is unremarkable in appearance. The stomach is within normal limits. No acute vascular abnormalities are seen. Scattered calcification is seen along the abdominal aorta and its branches. The vasculature is difficult to fully assess without contrast.  The appendix is not definitely characterized given surrounding ascites. The colon is  partially filled with stool and is grossly unremarkable in appearance, though difficult to fully characterize due to surrounding ascites.  The bladder is mildly distended and unremarkable in appearance. The prostate remains normal in size, with scattered calcification. Mildly enlarged bilateral inguinal nodes are seen, measuring up to 1.5 cm; some of this may reflect underlying soft tissue edema.  Diffuse soft tissue edema is again noted, raising concern for anasarca. This is mildly improved from the prior study.  No acute osseous abnormalities are identified. Diffuse uniform sclerotic change within the visualized osseous structures reflects renal osteodystrophy.  IMPRESSION: 1. Significantly increased moderate to large volume abdominopelvic ascites. 2. 1.9 cm focus of decreased attenuation within the right hepatic lobe, with overlying retraction of the hepatic capsule, appears to have increased in size from the prior study. Malignancy cannot be excluded. Would correlate with LFTs and consider dynamic liver protocol CT for further evaluation. 3. Splenomegaly and hepatomegaly noted. 4. Cardiomegaly noted.  Diffuse coronary artery calcifications seen. 5. Scattered calcified enlarged hilar and mediastinal nodes again noted, measuring up to 3.0 cm in short axis. These were present in 2014, and thought to reflect remote granulomatous disease. 6. Mild patchy bilateral atelectasis noted. 7. Severe bilateral renal atrophy, with a few scattered renal cysts. 8. Scattered calcification along the abdominal aorta and its branches. 9. Mildly enlarged bilateral inguinal nodes, measuring up to 1.5 cm in short axis. This may partially reflect underlying soft  tissue edema. 10. Diffuse soft tissue edema, raising concern for anasarca; this is mildly improved from the prior study. 11. Findings of renal osteodystrophy.   Electronically Signed   By: Garald Balding M.D.   On: 08/30/2014 07:44   Dg Chest 2 View  08/30/2014   CLINICAL  DATA:  Acute onset of shortness of breath and abdominal distention. Initial encounter.  EXAM: CHEST  2 VIEW  COMPARISON:  Chest radiograph performed 01/16/2014, and CT of the chest performed 09/01/2013  FINDINGS: The lungs are hypoexpanded. Bibasilar airspace opacities may reflect multifocal pneumonia or pulmonary edema. There is no evidence of pleural effusion or pneumothorax.  The heart is enlarged.  No acute osseous abnormalities are seen.  IMPRESSION: Lungs hypoexpanded. Bibasilar airspace opacities may reflect multifocal pneumonia or mild pulmonary edema. Cardiomegaly noted.   Electronically Signed   By: Garald Balding M.D.   On: 08/30/2014 05:41    Electrocardiogram: afib at 95 bpm, normal axis, poor quality EKG. Non specific t wave changes. No concerning ST changes.  Problem List  Principal Problem:   Ascites Active Problems:   Atrial fibrillation   ESRD on hemodialysis   Cough syncope   Thrombocytopenia   Leukopenia   Abnormal LFTs   Pedal edema   Assessment: This is a 63 year old African-American male who presents with abdominal pain and discomfort and is found to have significant ascites. This is probably the reason for his discomfort. Reason for the ascites is not clear. CT scan done without contrast showed hepatosplenomegaly. Liver lesion was also noted, although could not be characterized further due to lack of contrast.  Plan: #1 Abdominal discomfort, most likely due to ascites: He will need to undergo paracentesis. Fluid will need to be sent for analysis including cytology. He'll be given analgesic agents. Unclear what's causing the ascites. Liver disease is a possibility. He denies significant alcohol intake. But this history will need to be pursued further. Hepatitis panel will be ordered.  #2 Possible liver lesion: He will benefit from a CT scan with contrast. I have discussed with the nephrologist on call and it is okay to proceed with CT scan with contrast at this  time.  #3 Leukopenia and thrombocytopenia: These findings appear to be new, at least for these last few months. These counts were normal last year. Check B-12 level. Possibly due to liver disease. Repeat counts in the morning.  #4 End-stage renal disease on hemodialysis on Monday, Wednesday, Friday: Nephrology has been notified.  #5 Syncope possibly induced by Cough: Monitor on telemetry. Check troponin. Chest x-ray suggests possible bibasilar edema. Although he is saturating 98% on oxygen. His dyspnea appears to be mostly due to his abdominal distention.  #6 history of chronic atrial fibrillation: Heart rate is stable. He is not on anticoagulation. He used to be on warfarin, which apparently caused significant bleeding and as a result, patient declines any form of anticoagulation.  #7 history of essential hypertension: Monitor blood pressures closely.  DVT Prophylaxis: Teds stockings. Checking LE for DVT. Code Status: Full code Family Communication: Discussed with the patient  Disposition Plan: Admit to telemetry   Further management decisions will depend on results of further testing and patient's response to treatment.   [facility]  [facility] Pager [phone]  If 7PM-7AM, please contact night-coverage [email or web address] Password [email or web address]  08/30/2014, 8:50 AM

## 2014-08-30 NOTE — ED Provider Notes (Signed)
CSN: [id number]     Arrival date & time 08/30/14  4098 History   First MD Initiated Contact with Patient 08/30/14 0349    This chart was scribed for Olivia Mackie, MD by Marica Slate Debroux, ED Scribe. This patient was seen in room B15C/B15C and the patient's care was started at 3:54 AM.  Chief Complaint  Patient presents with  . Shortness of Breath    The patient is a dialysis patient, dialyses on M, W and Friday.  He did go Monday and was dialysed.  He presents today with a rigid abdomen and SOB.   The history is provided by the patient. No language interpreter was used.   PCP: Dyke Maes, MD HPI Comments: Trevor [name] is a 63 y.o. male, arriving via ambulance, who is a dialysis pt (M, W, F--last dialysis taking place this past Monday) and with detailed medical Hx noted below, who presents to the Emergency Department complaining of SOB with associated rigid abd and cough onset this past July. Pt reports he had a hernia repair in July and since the procedure his abd swelling has been waxing and waning. Pt reports he went to his cardiologist today and his cardiologist told him to present to the ED if his abd swelling did not subside. Pt also notes that his coughing episodes, on occasion, causes near syncope; pt specifies that the last episode of near syncope was yesterday morning. Pt denies any Hx of any liver problems.   Past Medical History  Diagnosis Date  . Anemia   . AVF (arteriovenous fistula)     Left  . Secondary hyperparathyroidism   . Hypovitaminosis D   . Hypertensive urgency     H/o  . CHF (congestive heart failure)   . Exertional shortness of breath     "related to infection in my lungs right now" (05/03/2013)  . History of gout     "before I started doing the dialysis" (05/03/2013)  . Sleep apnea   . GERD (gastroesophageal reflux disease)   . Syncope     felt secondary to residual anesthesia the day before - 2D echo unremarkable  . Pulmonary embolism     with right  DVT secondary to recent surgery  . Atrial fibrillation     not on coumadin due to large spontaneous hematoma on back and anemia  . Myocardial infarction 90's  . Coronary artery disease   . Dysrhythmia     afib  . Peripheral vascular disease     dvt leg 12/14  . Hypertension   . ESRD (end stage renal disease) on dialysis     [facility] mon/wed/fri   Past Surgical History  Procedure Laterality Date  . Av fistula placement Left     Dr. Charlean Sanfilippo; "I've had 2 on the left' (05/03/2013)  . Av fistula placement Right ~ 2011  . Knee arthroscopy Left   . Avgg removal Right 05/04/2013    Procedure: REMOVAL OF ARTERIOVENOUS Fistula Right Arm;  Surgeon: [name], MD;  Location: [facility] OR;  Service: Vascular;  Laterality: Right;  . Insertion of dialysis catheter Right 05/04/2013    Procedure: INSERTION OF DIALYSIS CATHETER;  Surgeon: [name], MD;  Location: [facility] OR;  Service: Vascular;  Laterality: Right;  . Colonoscopy      Hx: of  . Bascilic vein transposition Left 06/27/2013    Procedure: BASCILIC VEIN TRANSPOSITION;  Surgeon: [name], MD;  Location: [facility] OR;  Service: Vascular;  Laterality: Left;  .  Cardioversion N/A 08/29/2013    Procedure: CARDIOVERSION;  Surgeon: [name], MD;  Location: [facility] ENDOSCOPY;  Service: Cardiovascular;  Laterality: N/A;  . Removal of a dialysis catheter  2/15  . Hernia repair  03/21/14    Umbilical hernia-Dr. Derrell Lolling  . Umbilical hernia repair N/A 03/21/2014    Procedure: LAPAROSCOPIC UMBILICAL HERNIA REPAIR WITH MESH;  Surgeon: Axel Filler, MD;  Location: [facility] OR;  Service: General;  Laterality: N/A;  . Insertion of mesh N/A 03/21/2014    Procedure: INSERTION OF MESH;  Surgeon: Axel Filler, MD;  Location: [facility] OR;  Service: General;  Laterality: N/A;  . Venogram Left 05/26/2013    Procedure: VENOGRAM;  Surgeon: Fransisco Hertz, MD;  Location: [facility] CATH LAB;  Service: Cardiovascular;  Laterality: Left;   Family History  Problem Relation Age of  Onset  . Hypertension Father   . Kidney disease Father   . Allergies Father   . Deep vein thrombosis Sister   . Pulmonary embolism Sister   . Diabetes Paternal Grandmother    History  Substance Use Topics  . Smoking status: Former Smoker -- 0.25 packs/day for .5 years    Types: Cigarettes    Quit date: 09/08/1972  . Smokeless tobacco: Never Used  . Alcohol Use: Yes     Comment: 05/03/2013 "haven't had a glass of wine in ~ 3 months or so; sometimes will have one w/dinner"    Review of Systems   See History of Present Illness; otherwise all other systems are reviewed and negative  Allergies  Pork-derived products  Home Medications   Prior to Admission medications   Medication Sig Start Date End Date Taking? Authorizing Provider  amLODipine (NORVASC) 10 MG tablet Take 10 mg by mouth daily.      Historical Provider, MD  aspirin EC 81 MG tablet Take 1 tablet (81 mg total) by mouth daily. 09/06/13   Penny Pia, MD  labetalol (NORMODYNE) 200 MG tablet Take 200 mg by mouth 2 (two) times daily.    Historical Provider, MD  multivitamin (RENA-VIT) TABS tablet Take 1 tablet by mouth daily. 08/01/14   Historical Provider, MD  Nutritional Supplements (NEPRO) LIQD Take by mouth daily.    Historical Provider, MD  sevelamer carbonate (RENVELA) 800 MG tablet Take 2,400-3,200 mg by mouth See admin instructions. 3200mg  with each meal, 2400-3200mg  with each snack    Historical Provider, MD  triamcinolone ointment (KENALOG) 0.5 % Apply 0.5 application topically daily. 08/18/14   Historical Provider, MD  vitamin C (ASCORBIC ACID) 500 MG tablet Take 500-1,500 mg by mouth as needed.     Historical Provider, MD   Triage Vitals: BP 126/107 mmHg  Pulse 91  Temp(Src) 98.1 F (36.7 C) (Oral)  Resp 20  SpO2 100% Physical Exam  Constitutional: He is oriented to person, place, and time. He appears well-developed and well-nourished. No distress.  Chronically ill appearing male with dyspnea  HENT:   Head: Normocephalic and atraumatic.  Nose: Nose normal.  Mouth/Throat: Oropharynx is clear and moist.  Eyes: Conjunctivae and EOM are normal.  Neck: Neck supple.  Cardiovascular: Normal rate, regular rhythm, normal heart sounds and intact distal pulses.  Exam reveals no gallop.   No murmur heard. Patient noted to have vascular engorgement of veins to neck, chest, arms  Pulmonary/Chest: Effort normal and breath sounds normal. No respiratory distress. He has no wheezes. He has no rales. He exhibits no tenderness.  Abdominal: Soft. Bowel sounds are normal. He exhibits distension and mass.  There is tenderness. There is no rebound and no guarding.  Abdomen is distended, firm.  Hepatomegaly noted.  Difficult to ascertain ascites secondary to firm abdomen.  Abdomen is tender  Musculoskeletal: Normal range of motion.  Neurological: He is alert and oriented to person, place, and time.  Skin: Skin is warm and dry. Rash (diffuse areas of scarring, few open wounds to abdomen and chest secondary excoriation and picking noted) noted.  Psychiatric: He has a normal mood and affect. His behavior is normal.  Nursing note and vitals reviewed.   ED Course  Procedures (including critical care time) DIAGNOSTIC STUDIES: Oxygen Saturation is 100% on RA, nl by my interpretation.    COORDINATION OF CARE: 3:59 AM-Discussed treatment plan which includes imaging with pt at bedside and pt agreed to plan.   Labs Review Labs Reviewed  COMPREHENSIVE METABOLIC PANEL - Abnormal; Notable for the following:    BUN 46 (*)    Creatinine, Ser 6.65 (*)    Albumin 2.8 (*)    AST 42 (*)    Alkaline Phosphatase 228 (*)    Total Bilirubin 2.0 (*)    GFR calc non Af Amer 8 (*)    GFR calc Af Amer 9 (*)    All other components within normal limits  CBC WITH DIFFERENTIAL - Abnormal; Notable for the following:    WBC 2.4 (*)    HCT 38.0 (*)    RDW 18.3 (*)    Platelets 74 (*)    Neutro Abs 1.5 (*)    Lymphs Abs 0.5 (*)     Monocytes Relative 13 (*)    All other components within normal limits  BRAIN NATRIURETIC PEPTIDE - Abnormal; Notable for the following:    B Natriuretic Peptide 1959.9 (*)    All other components within normal limits  PROTIME-INR  I-STAT CG4 LACTIC ACID, ED    Imaging Review Ct Abdomen Pelvis Wo Contrast  08/30/2014   CLINICAL DATA:  Worsening abdominal distention and generalized abdominal pain, chronic in nature. Initial encounter.  EXAM: CT ABDOMEN AND PELVIS WITHOUT CONTRAST  TECHNIQUE: Multidetector CT imaging of the abdomen and pelvis was performed following the standard protocol without IV contrast.  COMPARISON:  CT of the abdomen and pelvis from 03/26/2014  FINDINGS: Mild patchy bilateral atelectasis is noted. Diffuse coronary artery calcifications are seen. Scattered calcified enlarged hilar and mediastinal nodes are again seen, measuring up to 3.0 cm in short axis. Cardiomegaly is noted.  There is significantly increased moderate to large volume ascites within the abdomen and pelvis.  Hepatomegaly is again noted. A 1.9 cm focus of decreased attenuation within the right hepatic lobe, with overlying retraction of the hepatic capsule, appears to have increased in size from the prior study. Malignancy cannot be excluded. Evaluation for additional hepatic masses is limited without contrast. The spleen is enlarged, as on the prior study, measuring 13.8 cm in length.  The pancreas and adrenal glands are unremarkable in appearance.  Severe bilateral renal atrophy is noted, with a few scattered renal cysts. Nonspecific perinephric stranding is noted bilaterally. There is no evidence of hydronephrosis. No renal or ureteral stones are seen.  The small bowel is unremarkable in appearance. The stomach is within normal limits. No acute vascular abnormalities are seen. Scattered calcification is seen along the abdominal aorta and its branches. The vasculature is difficult to fully assess without contrast.   The appendix is not definitely characterized given surrounding ascites. The colon is partially filled with stool and is  grossly unremarkable in appearance, though difficult to fully characterize due to surrounding ascites.  The bladder is mildly distended and unremarkable in appearance. The prostate remains normal in size, with scattered calcification. Mildly enlarged bilateral inguinal nodes are seen, measuring up to 1.5 cm; some of this may reflect underlying soft tissue edema.  Diffuse soft tissue edema is again noted, raising concern for anasarca. This is mildly improved from the prior study.  No acute osseous abnormalities are identified. Diffuse uniform sclerotic change within the visualized osseous structures reflects renal osteodystrophy.  IMPRESSION: 1. Significantly increased moderate to large volume abdominopelvic ascites. 2. 1.9 cm focus of decreased attenuation within the right hepatic lobe, with overlying retraction of the hepatic capsule, appears to have increased in size from the prior study. Malignancy cannot be excluded. Would correlate with LFTs and consider dynamic liver protocol CT for further evaluation. 3. Splenomegaly and hepatomegaly noted. 4. Cardiomegaly noted.  Diffuse coronary artery calcifications seen. 5. Scattered calcified enlarged hilar and mediastinal nodes again noted, measuring up to 3.0 cm in short axis. These were present in 2014, and thought to reflect remote granulomatous disease. 6. Mild patchy bilateral atelectasis noted. 7. Severe bilateral renal atrophy, with a few scattered renal cysts. 8. Scattered calcification along the abdominal aorta and its branches. 9. Mildly enlarged bilateral inguinal nodes, measuring up to 1.5 cm in short axis. This may partially reflect underlying soft tissue edema. 10. Diffuse soft tissue edema, raising concern for anasarca; this is mildly improved from the prior study. 11. Findings of renal osteodystrophy.   Electronically Signed   By:  Roanna Raider M.D.   On: 08/30/2014 07:44   Dg Chest 2 View  08/30/2014   CLINICAL DATA:  Acute onset of shortness of breath and abdominal distention. Initial encounter.  EXAM: CHEST  2 VIEW  COMPARISON:  Chest radiograph performed 01/16/2014, and CT of the chest performed 09/01/2013  FINDINGS: The lungs are hypoexpanded. Bibasilar airspace opacities may reflect multifocal pneumonia or pulmonary edema. There is no evidence of pleural effusion or pneumothorax.  The heart is enlarged.  No acute osseous abnormalities are seen.  IMPRESSION: Lungs hypoexpanded. Bibasilar airspace opacities may reflect multifocal pneumonia or mild pulmonary edema. Cardiomegaly noted.   Electronically Signed   By: Roanna Raider M.D.   On: 08/30/2014 05:41     EKG Interpretation   Date/Time:  Wednesday August 30 2014 03:38:56 EST Ventricular Rate:  95 PR Interval:    QRS Duration: 108 QT Interval:  534 QTC Calculation: 671 R Axis:   159 Text Interpretation:  Atrial fibrillation Consider right ventricular  hypertrophy Minimal ST elevation, lateral leads Prolonged QT interval  Baseline wander in lead(s) V6 Confirmed by Asriel Westrup  MD, Marguerette Sheller ([id number]) on  08/30/2014 6:15:24 AM     Results for orders placed or performed during the hospital encounter of 08/30/14  Comprehensive metabolic panel  Result Value Ref Range   Sodium 135 135 - 145 mmol/L   Potassium 4.7 3.5 - 5.1 mmol/L   Chloride 97 96 - 112 mEq/L   CO2 25 19 - 32 mmol/L   Glucose, Bld 84 70 - 99 mg/dL   BUN 46 (H) 6 - 23 mg/dL   Creatinine, Ser 6.04 (H) 0.50 - 1.35 mg/dL   Calcium 8.6 8.4 - 54.0 mg/dL   Total Protein 7.1 6.0 - 8.3 g/dL   Albumin 2.8 (L) 3.5 - 5.2 g/dL   AST 42 (H) 0 - 37 U/L   ALT 32 0 - 53 U/L  Alkaline Phosphatase 228 (H) 39 - 117 U/L   Total Bilirubin 2.0 (H) 0.3 - 1.2 mg/dL   GFR calc non Af Amer 8 (L) >90 mL/min   GFR calc Af Amer 9 (L) >90 mL/min   Anion gap 13 5 - 15  CBC with Differential  Result Value Ref Range   WBC  2.4 (L) 4.0 - 10.5 K/uL   RBC 4.60 4.22 - 5.81 MIL/uL   Hemoglobin 13.2 13.0 - 17.0 g/dL   HCT 81.1 (L) 91.4 - 78.2 %   MCV 82.6 78.0 - 100.0 fL   MCH 28.7 26.0 - 34.0 pg   MCHC 34.7 30.0 - 36.0 g/dL   RDW 95.6 (H) 21.3 - 08.6 %   Platelets 74 (L) 150 - 400 K/uL   Neutrophils Relative % 63 43 - 77 %   Neutro Abs 1.5 (L) 1.7 - 7.7 K/uL   Lymphocytes Relative 21 12 - 46 %   Lymphs Abs 0.5 (L) 0.7 - 4.0 K/uL   Monocytes Relative 13 (H) 3 - 12 %   Monocytes Absolute 0.3 0.1 - 1.0 K/uL   Eosinophils Relative 3 0 - 5 %   Eosinophils Absolute 0.1 0.0 - 0.7 K/uL   Basophils Relative 0 0 - 1 %   Basophils Absolute 0.0 0.0 - 0.1 K/uL  Brain natriuretic peptide  Result Value Ref Range   B Natriuretic Peptide 1959.9 (H) 0.0 - 100.0 pg/mL  Protime-INR  Result Value Ref Range   Prothrombin Time 14.8 11.6 - 15.2 seconds   INR 1.15 0.00 - 1.49  I-Stat CG4 Lactic Acid, ED  Result Value Ref Range   Lactic Acid, Venous 1.93 0.5 - 2.2 mmol/L   Ct Abdomen Pelvis Wo Contrast  08/30/2014   CLINICAL DATA:  Worsening abdominal distention and generalized abdominal pain, chronic in nature. Initial encounter.  EXAM: CT ABDOMEN AND PELVIS WITHOUT CONTRAST  TECHNIQUE: Multidetector CT imaging of the abdomen and pelvis was performed following the standard protocol without IV contrast.  COMPARISON:  CT of the abdomen and pelvis from 03/26/2014  FINDINGS: Mild patchy bilateral atelectasis is noted. Diffuse coronary artery calcifications are seen. Scattered calcified enlarged hilar and mediastinal nodes are again seen, measuring up to 3.0 cm in short axis. Cardiomegaly is noted.  There is significantly increased moderate to large volume ascites within the abdomen and pelvis.  Hepatomegaly is again noted. A 1.9 cm focus of decreased attenuation within the right hepatic lobe, with overlying retraction of the hepatic capsule, appears to have increased in size from the prior study. Malignancy cannot be excluded.  Evaluation for additional hepatic masses is limited without contrast. The spleen is enlarged, as on the prior study, measuring 13.8 cm in length.  The pancreas and adrenal glands are unremarkable in appearance.  Severe bilateral renal atrophy is noted, with a few scattered renal cysts. Nonspecific perinephric stranding is noted bilaterally. There is no evidence of hydronephrosis. No renal or ureteral stones are seen.  The small bowel is unremarkable in appearance. The stomach is within normal limits. No acute vascular abnormalities are seen. Scattered calcification is seen along the abdominal aorta and its branches. The vasculature is difficult to fully assess without contrast.  The appendix is not definitely characterized given surrounding ascites. The colon is partially filled with stool and is grossly unremarkable in appearance, though difficult to fully characterize due to surrounding ascites.  The bladder is mildly distended and unremarkable in appearance. The prostate remains normal in size, with scattered calcification.  Mildly enlarged bilateral inguinal nodes are seen, measuring up to 1.5 cm; some of this may reflect underlying soft tissue edema.  Diffuse soft tissue edema is again noted, raising concern for anasarca. This is mildly improved from the prior study.  No acute osseous abnormalities are identified. Diffuse uniform sclerotic change within the visualized osseous structures reflects renal osteodystrophy.  IMPRESSION: 1. Significantly increased moderate to large volume abdominopelvic ascites. 2. 1.9 cm focus of decreased attenuation within the right hepatic lobe, with overlying retraction of the hepatic capsule, appears to have increased in size from the prior study. Malignancy cannot be excluded. Would correlate with LFTs and consider dynamic liver protocol CT for further evaluation. 3. Splenomegaly and hepatomegaly noted. 4. Cardiomegaly noted.  Diffuse coronary artery calcifications seen. 5.  Scattered calcified enlarged hilar and mediastinal nodes again noted, measuring up to 3.0 cm in short axis. These were present in 2014, and thought to reflect remote granulomatous disease. 6. Mild patchy bilateral atelectasis noted. 7. Severe bilateral renal atrophy, with a few scattered renal cysts. 8. Scattered calcification along the abdominal aorta and its branches. 9. Mildly enlarged bilateral inguinal nodes, measuring up to 1.5 cm in short axis. This may partially reflect underlying soft tissue edema. 10. Diffuse soft tissue edema, raising concern for anasarca; this is mildly improved from the prior study. 11. Findings of renal osteodystrophy.   Electronically Signed   By: Roanna RaiderJeffery  Chang M.D.   On: 08/30/2014 07:44   Dg Chest 2 View  08/30/2014   CLINICAL DATA:  Acute onset of shortness of breath and abdominal distention. Initial encounter.  EXAM: CHEST  2 VIEW  COMPARISON:  Chest radiograph performed 01/16/2014, and CT of the chest performed 09/01/2013  FINDINGS: The lungs are hypoexpanded. Bibasilar airspace opacities may reflect multifocal pneumonia or pulmonary edema. There is no evidence of pleural effusion or pneumothorax.  The heart is enlarged.  No acute osseous abnormalities are seen.  IMPRESSION: Lungs hypoexpanded. Bibasilar airspace opacities may reflect multifocal pneumonia or mild pulmonary edema. Cardiomegaly noted.   Electronically Signed   By: Roanna RaiderJeffery  Chang M.D.   On: 08/30/2014 05:41     MDM   Final diagnoses:  SOB (shortness of breath)  Abdominal distension  Ascites  Hepatomegaly  Neutropenia  Thrombocytopenia  ESRD (end stage renal disease) on dialysis  Chronic atrial fibrillation    63 year old male with worsening abdominal distention, ongoing for the last several months.  Patient is having dyspnea secondary to abdominal distention.  Plan for labs, CT abdomen pelvis with oral contrast.   I personally performed the services described in this documentation, which was  scribed in my presence. The recorded information has been reviewed and is accurate.    Olivia Mackielga M Julee Stoll, MD 08/30/14 [phone]

## 2014-08-30 NOTE — ED Notes (Signed)
The patient is a dialysis patient, dialyses on M, W and Friday.  He did go Monday and was dialysed.  He presents today with a rigid abdomen and SOB.  The patient had a hernia repair in July and the abdomen keeps getting bigger, he went to the Cardiologist today and MD tolld him that if his abdomen kept getting bigger he needed to come to the ED.  According to EMS, the patient's abdomen is rigid and the patient is having a hard time taking a breath.  The Cardiologist made an appointment for him to see his PCP on Thursday but the patient is could not wait until Thursday.

## 2014-08-30 NOTE — ED Notes (Signed)
Patient transported to x-ray. ?

## 2014-08-30 NOTE — Procedures (Signed)
I have seen and examined this patient and agree with the plan of care . Patient seen on dialysis and has no complaints Trevor [name] W 08/30/2014, 3:16 PM

## 2014-08-30 NOTE — ED Notes (Signed)
Hospitalist at bedside at this time 

## 2014-08-31 DIAGNOSIS — R7989 Other specified abnormal findings of blood chemistry: Secondary | ICD-10-CM

## 2014-08-31 DIAGNOSIS — R188 Other ascites: Secondary | ICD-10-CM

## 2014-08-31 DIAGNOSIS — R05 Cough: Secondary | ICD-10-CM

## 2014-08-31 DIAGNOSIS — R160 Hepatomegaly, not elsewhere classified: Secondary | ICD-10-CM

## 2014-08-31 DIAGNOSIS — D696 Thrombocytopenia, unspecified: Secondary | ICD-10-CM

## 2014-08-31 DIAGNOSIS — R1013 Epigastric pain: Secondary | ICD-10-CM

## 2014-08-31 DIAGNOSIS — G8929 Other chronic pain: Secondary | ICD-10-CM

## 2014-08-31 LAB — CBC
HCT: 38.1 % — ABNORMAL LOW (ref 39.0–52.0)
Hemoglobin: 13.5 g/dL (ref 13.0–17.0)
MCH: 28.4 pg (ref 26.0–34.0)
MCHC: 35.4 g/dL (ref 30.0–36.0)
MCV: 80.2 fL (ref 78.0–100.0)
Platelets: 72 10*3/uL — ABNORMAL LOW (ref 150–400)
RBC: 4.75 MIL/uL (ref 4.22–5.81)
RDW: 18.2 % — AB (ref 11.5–15.5)
WBC: 3 10*3/uL — AB (ref 4.0–10.5)

## 2014-08-31 LAB — FOLATE RBC: RBC Folate: 1045 ng/mL — ABNORMAL HIGH (ref >280)

## 2014-08-31 LAB — COMPREHENSIVE METABOLIC PANEL
ALT: 28 U/L (ref 0–53)
ANION GAP: 13 (ref 5–15)
AST: 37 U/L (ref 0–37)
Albumin: 2.6 g/dL — ABNORMAL LOW (ref 3.5–5.2)
Alkaline Phosphatase: 225 U/L — ABNORMAL HIGH (ref 39–117)
BILIRUBIN TOTAL: 2 mg/dL — AB (ref 0.3–1.2)
BUN: 40 mg/dL — ABNORMAL HIGH (ref 6–23)
CHLORIDE: 99 meq/L (ref 96–112)
CO2: 22 mmol/L (ref 19–32)
CREATININE: 6.09 mg/dL — AB (ref 0.50–1.35)
Calcium: 8.3 mg/dL — ABNORMAL LOW (ref 8.4–10.5)
GFR, EST AFRICAN AMERICAN: 10 mL/min — AB (ref >90)
GFR, EST NON AFRICAN AMERICAN: 9 mL/min — AB (ref >90)
GLUCOSE: 78 mg/dL (ref 70–99)
Potassium: 4.2 mmol/L (ref 3.5–5.1)
Sodium: 134 mmol/L — ABNORMAL LOW (ref 135–145)
Total Protein: 6.7 g/dL (ref 6.0–8.3)

## 2014-08-31 LAB — AMYLASE, PERITONEAL FLUID: Amylase, peritoneal fluid: 26 U/L

## 2014-08-31 NOTE — Consult Note (Signed)
[facility] consult Note   Consulting MD:  Dr Algis Liming.  Primary Care Physician:  Maximino Greenland, MD Primary Gastroenterologist:  Dr. Deatra Ina  Reason for Consult:  Liver mass, ascites    HPI: Trevor [name] is a 63 y.o. male.  ESRD on HD.  CHF.  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  It was discontinued when he developed a large hematoma on his back in 2014 and he has refused to go back on Coumadin since then. Anemia on Aranesp. Umbilical hernia repair with mesh 03/2014.  10/2005 colonoscopy for anemia, low ferritin.  Sigmoid diverticulosis.   GI eval in 07/2013 for refractory cough, ? Due to GERD. +hepatomegaly on exam. GES showed normal gastric emptying. Never ended up undergoing EGD. Ultrasound 07/2013 showed irregular GB wall thickening,  stable right lobe liver lesion 1.8 x 1.4 x 1.8 cm (not new and felt to represent incidental hemangioma).  CT scan 03/2014 showed hepatosplenomegaly, body wall edema, 12 mm simple cyst in right lobe liver, small amount intra-abdominal ascites.  Hepatitis ABC serologies all negative. Alpha 1 AT, ceruloplasmin levels normal. ACE level elevated 08/2013.  Admission yesterday with abdominal pain.  Generalized abdominal pain has been present since he underwent the umbilicus hernia repair in July. On average it scores 7 out of 10 and it is fairly constant. It is worse if he stands up or walks. It is made it difficult for him to walk. It seems to improve for a few hours following dialysis. It is not affected by bowel movements nor meals. He moves his bowels every day and they are brown and formed. He used hydrocodone for the pain a few days ago and it did not help. He doesn't have nausea or vomiting, nor anorexia. Pain has progressed in intensity over the last few weeks. Within the last couple of days the pain became even more intense scoring 10 out of 10.  LFTs normal except for elevated alk phos to  220s (previously elevated but into 150s), albumin 2.6.  CT abdomen x 2 yesterday with large volume ascites, 1.9 cm right lobe liver mass, hepatomegaly, mediastinal/hilar adenopathy, splenomegaly, evidence of right heart dysfunction with reflux of contrast into hepatic veins, inguinal adenopathy, soft tissue edema/?anasarca.  S/p 1.2 liter paracentesis.  Fluid with 240 WBCs (72% monos).  SAG 1.2 c/w likely portal htn. The abdominal pain improved substantially following paracentesis.  On abdominal US: cholelithiasis, GB wall thickening, 5.5 mm CBD, splenomegaly, liver mass on CT not seen on ultrasound.   Patient drinks on rare occasions 1-2 beers at most every few months. He has never been a heavy drinker. He doesn't use NSAIDs.  The only aspirin in use is the 81 mg aspirin daily.  Past Medical History  Diagnosis Date  . Anemia   . AVF (arteriovenous fistula)     Left  . Secondary hyperparathyroidism   . Hypovitaminosis D   . Hypertensive urgency     H/o  . CHF (congestive heart failure)   . Exertional shortness of breath     "related to infection in my lungs right now" (05/03/2013)  . History of gout     "before I started doing the dialysis" (05/03/2013)  . Sleep apnea   . GERD (gastroesophageal reflux disease)   . Syncope     felt secondary to residual anesthesia the day before - 2D echo unremarkable  . Pulmonary embolism     with right DVT secondary to recent surgery  . Atrial fibrillation  not on coumadin due to large spontaneous hematoma on back and anemia  . Myocardial infarction 90's  . Coronary artery disease   . Dysrhythmia     afib  . Peripheral vascular disease     dvt leg 12/14  . Hypertension   . ESRD (end stage renal disease) on dialysis     [facility] mon/wed/fri    Past Surgical History  Procedure Laterality Date  . Av fistula placement Left     Dr. Su Grand; "I've had 2 on the left' (05/03/2013)  . Av fistula placement Right ~ 2011  . Knee arthroscopy Left     . Avgg removal Right 05/04/2013    Procedure: REMOVAL OF ARTERIOVENOUS Fistula Right Arm;  Surgeon: Elam Dutch, MD;  Location: [place];  Service: Vascular;  Laterality: Right;  . Insertion of dialysis catheter Right 05/04/2013    Procedure: INSERTION OF DIALYSIS CATHETER;  Surgeon: Elam Dutch, MD;  Location: [place];  Service: Vascular;  Laterality: Right;  . Colonoscopy      Hx: of  . Bascilic vein transposition Left 06/27/2013    Procedure: BASCILIC VEIN TRANSPOSITION;  Surgeon: Elam Dutch, MD;  Location: [place];  Service: Vascular;  Laterality: Left;  . Cardioversion N/A 08/29/2013    Procedure: CARDIOVERSION;  Surgeon: Sueanne Margarita, MD;  Location: [place];  Service: Cardiovascular;  Laterality: N/A;  . Removal of a dialysis catheter  2/15  . Hernia repair  2/59/56    Umbilical hernia-Dr. Rosendo Gros  . Umbilical hernia repair N/A 03/21/2014    Procedure: LAPAROSCOPIC UMBILICAL HERNIA REPAIR WITH MESH;  Surgeon: Ralene Ok, MD;  Location: [place];  Service: General;  Laterality: N/A;  . Insertion of mesh N/A 03/21/2014    Procedure: INSERTION OF MESH;  Surgeon: Ralene Ok, MD;  Location: [place];  Service: General;  Laterality: N/A;  . Venogram Left 05/26/2013    Procedure: VENOGRAM;  Surgeon: Conrad Arjay, MD;  Location: [facility] CATH LAB;  Service: Cardiovascular;  Laterality: Left;    Prior to Admission medications   Medication Sig Start Date End Date Taking? Authorizing Provider  amLODipine (NORVASC) 10 MG tablet Take 10 mg by mouth daily.     Yes Historical Provider, MD  aspirin EC 81 MG tablet Take 1 tablet (81 mg total) by mouth daily. 09/06/13  Yes Velvet Bathe, MD  labetalol (NORMODYNE) 200 MG tablet Take 200 mg by mouth 2 (two) times daily.   Yes Historical Provider, MD  multivitamin (RENA-VIT) TABS tablet Take 1 tablet by mouth daily. 08/01/14  Yes Historical Provider, MD  Nutritional Supplements (NEPRO) LIQD Take by mouth daily.   Yes Historical Provider, MD   sevelamer carbonate (RENVELA) 800 MG tablet Take 2,400-3,200 mg by mouth See admin instructions. 3259m with each meal, 2400-32057mwith each snack   Yes Historical Provider, MD  vitamin C (ASCORBIC ACID) 500 MG tablet Take 500-1,500 mg by mouth as needed.    Yes Historical Provider, MD  triamcinolone ointment (KENALOG) 0.5 % Apply 0.5 application topically daily. 08/18/14   Historical Provider, MD    Current Facility-Administered Medications  Medication Dose Route Frequency Provider Last Rate Last Dose  . 0.9 %  sodium chloride infusion  250 mL Intravenous PRN GoBonnielee HaffMD      . acetaminophen (TYLENOL) tablet 650 mg  650 mg Oral Q6H PRN GoBonnielee HaffMD       Or  . acetaminophen (TYLENOL) suppository 650 mg  650 mg Rectal Q6H PRN  Bonnielee Haff, MD      . albuterol (PROVENTIL) (2.5 MG/3ML) 0.083% nebulizer solution 2.5 mg  2.5 mg Nebulization Q2H PRN Bonnielee Haff, MD      . antiseptic oral rinse (CPC / CETYLPYRIDINIUM CHLORIDE 0.05%) solution 7 mL  7 mL Mouth Rinse BID Bonnielee Haff, MD   7 mL at 08/31/14 1122  . aspirin EC tablet 81 mg  81 mg Oral Daily Bonnielee Haff, MD   81 mg at 08/30/14 1117  . feeding supplement (NEPRO CARB STEADY) liquid 237 mL  237 mL Oral Daily Gardiner Barefoot, NP   237 mL at 08/31/14 0829  . labetalol (NORMODYNE) tablet 200 mg  200 mg Oral BID Bonnielee Haff, MD   200 mg at 08/31/14 0038  . morphine 2 MG/ML injection 1 mg  1 mg Intravenous Q4H PRN Bonnielee Haff, MD      . multivitamin (RENA-VIT) tablet 1 tablet  1 tablet Oral QHS Bonnielee Haff, MD   1 tablet at 08/31/14 0039  . ondansetron (ZOFRAN) tablet 4 mg  4 mg Oral Q6H PRN Bonnielee Haff, MD       Or  . ondansetron (ZOFRAN) injection 4 mg  4 mg Intravenous Q6H PRN Bonnielee Haff, MD      . oxyCODONE (Oxy IR/ROXICODONE) immediate release tablet 5 mg  5 mg Oral Q4H PRN Bonnielee Haff, MD      . sevelamer carbonate (RENVELA) tablet 2,400 mg  2,400 mg Oral PRN Bonnielee Haff, MD   2,400 mg at  08/31/14 0036  . sevelamer carbonate (RENVELA) tablet 3,200 mg  3,200 mg Oral TID WC Bonnielee Haff, MD   3,200 mg at 08/31/14 1123  . sodium chloride 0.9 % injection 3 mL  3 mL Intravenous Q12H Bonnielee Haff, MD   0 mL at 08/30/14 1000  . sodium chloride 0.9 % injection 3 mL  3 mL Intravenous Q12H Bonnielee Haff, MD   3 mL at 08/31/14 0039  . sodium chloride 0.9 % injection 3 mL  3 mL Intravenous PRN Bonnielee Haff, MD      . triamcinolone ointment (KENALOG) 0.5 % 1 application  1 application Topical Daily Bonnielee Haff, MD   1 application at 16/10/96 1123    Allergies as of 08/30/2014 - Review Complete 08/30/2014  Allergen Reaction Noted  . Pork-derived products Nausea And Vomiting 03/14/2014    Family History  Problem Relation Age of Onset  . Hypertension Father   . Kidney disease Father   . Allergies Father   . Deep vein thrombosis Sister   . Pulmonary embolism Sister   . Diabetes Paternal Grandmother     History   Social History  . Marital Status: Single    Spouse Name: N/A    Number of Children: 4  . Years of Education: N/A   Occupational History  . Disabled    Social History Main Topics  . Smoking status: Former Smoker -- 0.25 packs/day for .5 years    Types: Cigarettes    Quit date: 09/08/1972  . Smokeless tobacco: Never Used  . Alcohol Use: Yes     Comment: 05/03/2013 "haven't had a glass of wine in ~ 3 months or so; sometimes will have one w/dinner"  . Drug Use: No  . Sexual Activity: Yes   Other Topics Concern  . Not on file   Social History Narrative   Former smoker- quit over 30 yrs ago   Patient is on disability    REVIEW OF SYSTEMS: Constitutional:  Weight is stable. ENT:  No nasal discharge or congestion. Pulm:  Chronic, nonproductive cough. MDs feel this may be from amlodipine and it was discontinued yesterday. CV:  No chest pain, no palpitations GU:  Makes very little urine. GI:  Per history of present illness. No dysphagia. Heme:  No  issues with excessive bleeding or bruising..    Transfusions:  She recalls no previous transfusions. Neuro:  Had a couple of spells of syncope 2 days ago. Derm:  rx with prednisone dosepak for pruritic skin eruption. He has chronic pruritus ever since beginning dialysis. The pustules are on trunk and all 4 limbs and/or chronic. Endocrine:  No excessive thirst. No sweating Immunization:  Did not inquire as to current immunization status  Travel:  Has not traveled out of state   PHYSICAL EXAM:  Temp:  [97 F (36.1 C)-98.6 F (37 C)] 98.6 F (37 C) (12/24 0607) Pulse Rate:  [71-105] 95 (12/24 0607) Resp:  [18-32] 22 (12/24 0607) BP: (107-132)/(56-94) 115/86 mmHg (12/24 0607) SpO2:  [96 %-99 %] 98 % (12/24 0607) Weight:  [217 lb 2.5 oz (98.5 kg)-221 lb 5.5 oz (100.4 kg)] 217 lb 6.4 oz (98.612 kg) (12/24 0607) Last BM Date: 08/30/14 Filed Weights   08/30/14 1535 08/30/14 1917 08/31/14 0607  Weight: 221 lb 5.5 oz (100.4 kg) 217 lb 2.5 oz (98.5 kg) 217 lb 6.4 oz (98.612 kg)   General:  Chronically ill, overweight AAM. He is comfortable. Lots of paroxysmal coughing. Ears:  Not hard of hearing Mouth:  Dentition in good repair partial plate above.  Neck: No JVD, no TMG. Supple Lungs:  Clear to auscultation and percussion bilaterally. Dry cough. Voice is hoarse.   Heart:  RRR. No MRG. S1/S2 audible.  Abdomen:  Distended/protuberant but soft. Bowel sounds active. Minimal tenderness, which is nonfocal. No guarding or rebound. Unable to appreciate hepato-splenomegaly.    Rectal:  Deferred  Msk:  No joint contractures, erythema or swelling   Pulses:  Pedal pulses palpable.  Extremities:  1-2+ pitting edema in the feet/ankles/shins.  Neurologic: Alert and oriented 3. Good historian. No gross limb weakness. No tremors. Skin:  Multiple punctate lesions on the legs arms and trunk in various states of healing: Some dry and resolving others ulcerated. Psych:  Pleasant, not anxious or depressed     Intake/Output from previous day: 12/23 0701 - 12/24 0700 In: -  Out: 1915  Intake/Output this shift: Total I/O In: 480 [P.O.:480] Out: -   LAB RESULTS:  Recent Labs  08/30/14 0402 08/31/14 0620  WBC 2.4* 3.0*  HGB 13.2 13.5  HCT 38.0* 38.1*  PLT 74* 72*   BMET  Recent Labs  08/30/14 0402 08/31/14 0620  NA 135 134*  K 4.7 4.2  CL 97 99  CO2 25 22  GLUCOSE 84 78  BUN 46* 40*  CREATININE 6.65* 6.09*  CALCIUM 8.6 8.3*   LFT  Recent Labs  08/31/14 0620  PROT 6.7  ALBUMIN 2.6*  AST 37  ALT 28  ALKPHOS 225*  BILITOT 2.0*   PT/INR  Recent Labs  08/30/14 0651  LABPROT 14.8  INR 1.15   Hepatitis Panel  Recent Labs  08/30/14 1034  HEPBSAG NEGATIVE  HCVAB NEGATIVE  HEPAIGM NON REACTIVE  HEPBIGM NON REACTIVE     RADIOLOGY STUDIES: Ct Abdomen Pelvis Wo Contrast  08/30/2014   CLINICAL DATA:  Worsening abdominal distention and generalized abdominal pain, chronic in nature. Initial encounter.  EXAM: CT ABDOMEN AND PELVIS WITHOUT CONTRAST  TECHNIQUE:  Multidetector CT imaging of the abdomen and pelvis was performed following the standard protocol without IV contrast.  COMPARISON:  CT of the abdomen and pelvis from 03/26/2014  FINDINGS: Mild patchy bilateral atelectasis is noted. Diffuse coronary artery calcifications are seen. Scattered calcified enlarged hilar and mediastinal nodes are again seen, measuring up to 3.0 cm in short axis. Cardiomegaly is noted.  There is significantly increased moderate to large volume ascites within the abdomen and pelvis.  Hepatomegaly is again noted. A 1.9 cm focus of decreased attenuation within the right hepatic lobe, with overlying retraction of the hepatic capsule, appears to have increased in size from the prior study. Malignancy cannot be excluded. Evaluation for additional hepatic masses is limited without contrast. The spleen is enlarged, as on the prior study, measuring 13.8 cm in length.  The pancreas and adrenal  glands are unremarkable in appearance.  Severe bilateral renal atrophy is noted, with a few scattered renal cysts. Nonspecific perinephric stranding is noted bilaterally. There is no evidence of hydronephrosis. No renal or ureteral stones are seen.  The small bowel is unremarkable in appearance. The stomach is within normal limits. No acute vascular abnormalities are seen. Scattered calcification is seen along the abdominal aorta and its branches. The vasculature is difficult to fully assess without contrast.  The appendix is not definitely characterized given surrounding ascites. The colon is partially filled with stool and is grossly unremarkable in appearance, though difficult to fully characterize due to surrounding ascites.  The bladder is mildly distended and unremarkable in appearance. The prostate remains normal in size, with scattered calcification. Mildly enlarged bilateral inguinal nodes are seen, measuring up to 1.5 cm; some of this may reflect underlying soft tissue edema.  Diffuse soft tissue edema is again noted, raising concern for anasarca. This is mildly improved from the prior study.  No acute osseous abnormalities are identified. Diffuse uniform sclerotic change within the visualized osseous structures reflects renal osteodystrophy.  IMPRESSION: 1. Significantly increased moderate to large volume abdominopelvic ascites. 2. 1.9 cm focus of decreased attenuation within the right hepatic lobe, with overlying retraction of the hepatic capsule, appears to have increased in size from the prior study. Malignancy cannot be excluded. Would correlate with LFTs and consider dynamic liver protocol CT for further evaluation. 3. Splenomegaly and hepatomegaly noted. 4. Cardiomegaly noted.  Diffuse coronary artery calcifications seen. 5. Scattered calcified enlarged hilar and mediastinal nodes again noted, measuring up to 3.0 cm in short axis. These were present in 2014, and thought to reflect remote  granulomatous disease. 6. Mild patchy bilateral atelectasis noted. 7. Severe bilateral renal atrophy, with a few scattered renal cysts. 8. Scattered calcification along the abdominal aorta and its branches. 9. Mildly enlarged bilateral inguinal nodes, measuring up to 1.5 cm in short axis. This may partially reflect underlying soft tissue edema. 10. Diffuse soft tissue edema, raising concern for anasarca; this is mildly improved from the prior study. 11. Findings of renal osteodystrophy.   Electronically Signed   By: Garald Balding M.D.   On: 08/30/2014 07:44   Dg Chest 2 View  08/30/2014   CLINICAL DATA:  Acute onset of shortness of breath and abdominal distention. Initial encounter.  EXAM: CHEST  2 VIEW  COMPARISON:  Chest radiograph performed 01/16/2014, and CT of the chest performed 09/01/2013  FINDINGS: The lungs are hypoexpanded. Bibasilar airspace opacities may reflect multifocal pneumonia or pulmonary edema. There is no evidence of pleural effusion or pneumothorax.  The heart is enlarged.  No acute osseous  abnormalities are seen.  IMPRESSION: Lungs hypoexpanded. Bibasilar airspace opacities may reflect multifocal pneumonia or mild pulmonary edema. Cardiomegaly noted.   Electronically Signed   By: Garald Balding M.D.   On: 08/30/2014 05:41   US Abdomen Complete  08/30/2014   CLINICAL DATA:  Abnormal liver function tests.  Ascites.  EXAM: ULTRASOUND ABDOMEN COMPLETE  COMPARISON:  None.  FINDINGS: Gallbladder: Cholelithiasis. Gallbladder wall thickening measuring 9.1 mm. Negative sonographic Murphy's sign. Hyperechoic foci along the gallbladder wall with ring down artifact as can be seen with adenomyomatosis.  Common bile duct: Diameter: 5.5 mm  Liver: No focal lesion identified. Increased hepatic parenchymal echogenicity. There is hepatomegaly.  IVC: No abnormality visualized.  Pancreas: Visualized portion unremarkable.  Spleen: The spleen is enlarged measuring 720 mL. No focal splenic lesion.  Right  Kidney: Length: 7.1 cm. Echogenicity within normal limits. No mass or hydronephrosis visualized.  Left Kidney: Length: 5.8 cm. There is an anechoic left renal mass with increased through transmission measuring 13 x 17 mm most consistent with a cyst. Echogenicity within normal limits. No hydronephrosis visualized.  Abdominal aorta: The proximal abdominal aorta measures 3 cm in AP diameter.  Other findings: There is moderate abdominal ascites.  IMPRESSION: 1. Cholelithiasis. Gallbladder wall thickening which may be secondary to intrinsic gallbladder wall abnormality suggest cholecystitis, but this appearance can be seen in the setting of hepatocellular disease and ascites. 2. Atrophic kidneys. 3. Splenomegaly. 4. The hypodense mass seen in the right hepatic lobe adjacent to the liver capsule on CT abdomen performed same day is not visualized on the ultrasound. Further evaluation with a CT or MRI of the abdomen is recommended optimized for evaluation of the liver. 5. Proximal abdominal aorta measures 3 cm in AP diameter. Recommend followup by [place] in 3 years. This recommendation follows ACR consensus guidelines: White Paper of the ACR Incidental Findings Committee II on Vascular Findings. Joellyn Rued Radiol 2013; 01:601-093   Electronically Signed   By: Kathreen Devoid   On: 08/30/2014 17:55   Ct Abdomen Pelvis W Contrast  08/30/2014   CLINICAL DATA:  Abdominal tenderness in distension.  Liver lesion  EXAM: CT ABDOMEN AND PELVIS WITH CONTRAST  TECHNIQUE: Multidetector CT imaging of the abdomen and pelvis was performed using the standard protocol following bolus administration of intravenous contrast.  CONTRAST:  70m OMNIPAQUE IOHEXOL 300 MG/ML  SOLN  COMPARISON:  CT 08/30/2014 at 6:30 a.m.  FINDINGS: Lower chest: Interlobular septal thickening at the lung bases. No pleural fluid. Heart is enlarged. Pericardial fluid.  Hepatobiliary: There is reflux of contrast into the hepatic veins suggesting right heart dysfunction.  Moderate volume of fluid surrounding the margin of the liver similar to prior. Slight decrease in the fluid in the pelvis. There is mild traction of the right hepatic lobe not changed from prior with a small hypodense lesions. Gallbladder is collapsed.  Pancreas: Difficult to evaluate the intra-abdominal organs due to ascites. No gross abnormality pancreas.  Spleen: Spleen is enlarged.  Adrenals/urinary tract: Adrenal is normal. Kidneys are atrophic. Bladder is collapsed. No your ureter abnormality  Stomach/Bowel: Stomach, small bowel, colon are unremarkable. There is progression of the oral contrast from the prior scan into the descending colon rectum.  Vascular/Lymphatic: Abdominal aorta is normal caliber no retroperitoneal periportal lymphadenopathy.  Reproductive: Prostate gland is normal.  Musculoskeletal: There is diffuse gross bone consistent with renal osteodystrophy.  Other: Anasarca  IMPRESSION: 1. Interval decrease in volume of intraperitoneal free fluid volume paracentesis. Moderate volume of ascites remains.  2. No other significant interval change in short interval follow-up from 6:30 a.m. same day 3. No evidence of bowel obstruction. 4. Splenomegaly 5. Evidence of right heart dysfunction with reflux of contrast into hepatic veins. 6. Interlobular septal thickening at the lung bases consistent with interstitial edema.   Electronically Signed   By: Suzy Bouchard M.D.   On: 08/30/2014 14:42   US Paracentesis  08/30/2014   INDICATION: Ascites.  EXAM: ULTRASOUND-GUIDED PARACENTESIS  COMPARISON:  None.  MEDICATIONS: 10 cc 1% lidocaine  COMPLICATIONS: None immediate  TECHNIQUE: Informed written consent was obtained from the patient after a discussion of the risks, benefits and alternatives to treatment. A timeout was performed prior to the initiation of the procedure.  Initial ultrasound scanning demonstrates a large amount of ascites within the left lower abdominal quadrant. The left Lower abdomen was  prepped and draped in the usual sterile fashion. 1% lidocaine with epinephrine was used for local anesthesia. Under direct ultrasound guidance, a 19 gauge, 7-cm, Yueh catheter was introduced. An ultrasound image was saved for documentation purposed. the paracentesis was performed. The catheter was removed and a dressing was applied. The patient tolerated the procedure well without immediate post procedural complication.  FINDINGS: A total of approximately 1.2 liters of amber fluid was removed. Samples were sent to the laboratory as requested by the clinical team.  IMPRESSION: Successful ultrasound-guided paracentesis yielding 1.2 liters of peritoneal fluid.  Read by Lavonia Drafts [facility]   Electronically Signed   By: Markus Daft M.D.   On: 08/30/2014 16:27     IMPRESSION: *  Chronic abdominal pain. Although it began following his umbilical hernia repair, per the CT scan, there doesn't seem to be any obvious complication related to that surgery which would be a cause for the pain.  ?due to hepatosplenomegaly and ascites induced pressure on the abdominal cavity? Abdominal pain improved following paracentesis.  *  Hepatosplenomegaly, not new. No evidence of cirrhosis; though does have thrombocytopenia but not coagulopathy.   *  Lesion in right lobe of liver.  Not new.  Felt to be hemangioma in past.    *  Ascites. Not new but progressive. S/p paracentesis.  WBC count of 240 is under the "cut off" of 250 or above that is definitive for ruling in SBP. On Gram stain no WBCs or organisms seen and no growth of organisms at day 1 of culture. Abdominal pain improved following paracentesis.  *  ESRD  *  Thrombocytopenia.   *  Hx GERD.  Not on PPI at home.   *  Chronic cough.  Amlodipine now discontinued. Chest x-ray shows bilateral opacities: Mild edema versus multifocal PNA.  Has leukopenia and no fever. Not currently receiving antibiotics.  PLAN:  *  ? Repeat the paracentesis for therapeutic purposes?        LOS: 1 day   Azucena Freed  08/31/2014, 2:41 PM Pager [phone]  GI Attending Note   Chart was reviewed and patient was examined. X-rays and lab were reviewed.   Etiology of pain is not certain.  He did get relief with paracentesis yielding only 1.2 liters of fluid.  He also claims that pain began following herniorraphy.  Should it intensify would consider large volume paracentesis. Pt likely has cirrhosis.  Etiology is also unclear.  Chronic passive congestion may be causative.  ?secondary to drugs (amlodipine).  Pt likely has portal hypertension accounting for ascites and splenomegaly.  Still unsure why he has a chronic cough. Recommend checking  AMA, ANA and percutaneous liver biopsy.  Biopsy will have to be transjugular in view of ascites.  He should have an EGD as some point but this can be done as outpt.  Sandy Salaam. Deatra Ina, M.D., [place] [facility] [id number] [phone]

## 2014-08-31 NOTE — Progress Notes (Signed)
[facility] ROUNDING NOTE   Subjective:   Interval History:  Complains of abdominal distension  Objective:  Vital signs in last 24 hours:  Temp:  [97 F (36.1 C)-98.6 F (37 C)] 98.6 F (37 C) (12/24 0607) Pulse Rate:  [71-105] 95 (12/24 0607) Resp:  [18-32] 22 (12/24 0607) BP: (107-132)/(56-94) 115/86 mmHg (12/24 0607) SpO2:  [96 %-99 %] 98 % (12/24 0607) Weight:  [98.5 kg (217 lb 2.5 oz)-100.4 kg (221 lb 5.5 oz)] 98.612 kg (217 lb 6.4 oz) (12/24 0607)  Weight change:  Filed Weights   08/30/14 1535 08/30/14 1917 08/31/14 0607  Weight: 100.4 kg (221 lb 5.5 oz) 98.5 kg (217 lb 2.5 oz) 98.612 kg (217 lb 6.4 oz)    Intake/Output: I/O last 3 completed shifts: In: -  Out: 1915 [Other:1915]   Intake/Output this shift:  Total I/O In: 480 [P.O.:480] Out: -   CVS- RRR RS- CTA ABD- BS present distended and tympanic EXT- no peripheral edema   Basic Metabolic Panel:  Recent Labs Lab 08/30/14 0402 08/31/14 0620  NA 135 134*  K 4.7 4.2  CL 97 99  CO2 25 22  GLUCOSE 84 78  BUN 46* 40*  CREATININE 6.65* 6.09*  CALCIUM 8.6 8.3*    Liver Function Tests:  Recent Labs Lab 08/30/14 0402 08/31/14 0620  AST 42* 37  ALT 32 28  ALKPHOS 228* 225*  BILITOT 2.0* 2.0*  PROT 7.1 6.7  ALBUMIN 2.8* 2.6*   No results for input(s): LIPASE, AMYLASE in the last 168 hours. No results for input(s): AMMONIA in the last 168 hours.  CBC:  Recent Labs Lab 08/30/14 0402 08/31/14 0620  WBC 2.4* 3.0*  NEUTROABS 1.5*  --   HGB 13.2 13.5  HCT 38.0* 38.1*  MCV 82.6 80.2  PLT 74* 72*    Cardiac Enzymes:  Recent Labs Lab 08/30/14 1034 08/30/14 1546 08/30/14 2146  TROPONINI 0.04* <0.03 0.03    BNP: Invalid input(s): POCBNP  CBG: No results for input(s): GLUCAP in the last 168 hours.  Microbiology: Results for orders placed or performed during the hospital encounter of 08/30/14  Body fluid culture     Status: None (Preliminary result)   Collection  Time: 08/30/14  1:27 PM  Result Value Ref Range Status   Specimen Description FLUID ABDOMEN ASCITIC  Final   Special Requests NONE  Final   Gram Stain   Final    NO WBC SEEN NO ORGANISMS SEEN Performed at [facility]    Culture   Final    NO GROWTH 1 DAY Performed at [facility]    Report Status PENDING  Incomplete  AFB culture with smear     Status: None (Preliminary result)   Collection Time: 08/30/14  1:27 PM  Result Value Ref Range Status   Specimen Description FLUID ABDOMEN ASCITIC  Final   Special Requests NONE  Final   Acid Fast Smear   Final    NO ACID FAST BACILLI SEEN Performed at [facility]    Culture   Final    CULTURE WILL BE EXAMINED FOR 6 WEEKS BEFORE ISSUING A FINAL REPORT Performed at [facility]    Report Status PENDING  Incomplete    Coagulation Studies:  Recent Labs  08/30/14 0651  LABPROT 14.8  INR 1.15    Urinalysis: No results for input(s): COLORURINE, LABSPEC, PHURINE, GLUCOSEU, HGBUR, BILIRUBINUR, KETONESUR, PROTEINUR, UROBILINOGEN, NITRITE, LEUKOCYTESUR in the last 72 hours.  Invalid input(s): APPERANCEUR  Imaging: Ct Abdomen Pelvis Wo Contrast  08/30/2014   CLINICAL DATA:  Worsening abdominal distention and generalized abdominal pain, chronic in nature. Initial encounter.  EXAM: CT ABDOMEN AND PELVIS WITHOUT CONTRAST  TECHNIQUE: Multidetector CT imaging of the abdomen and pelvis was performed following the standard protocol without IV contrast.  COMPARISON:  CT of the abdomen and pelvis from 03/26/2014  FINDINGS: Mild patchy bilateral atelectasis is noted. Diffuse coronary artery calcifications are seen. Scattered calcified enlarged hilar and mediastinal nodes are again seen, measuring up to 3.0 cm in short axis. Cardiomegaly is noted.  There is significantly increased moderate to large volume ascites within the abdomen and pelvis.  Hepatomegaly is again noted. A 1.9 cm focus of decreased attenuation  within the right hepatic lobe, with overlying retraction of the hepatic capsule, appears to have increased in size from the prior study. Malignancy cannot be excluded. Evaluation for additional hepatic masses is limited without contrast. The spleen is enlarged, as on the prior study, measuring 13.8 cm in length.  The pancreas and adrenal glands are unremarkable in appearance.  Severe bilateral renal atrophy is noted, with a few scattered renal cysts. Nonspecific perinephric stranding is noted bilaterally. There is no evidence of hydronephrosis. No renal or ureteral stones are seen.  The small bowel is unremarkable in appearance. The stomach is within normal limits. No acute vascular abnormalities are seen. Scattered calcification is seen along the abdominal aorta and its branches. The vasculature is difficult to fully assess without contrast.  The appendix is not definitely characterized given surrounding ascites. The colon is partially filled with stool and is grossly unremarkable in appearance, though difficult to fully characterize due to surrounding ascites.  The bladder is mildly distended and unremarkable in appearance. The prostate remains normal in size, with scattered calcification. Mildly enlarged bilateral inguinal nodes are seen, measuring up to 1.5 cm; some of this may reflect underlying soft tissue edema.  Diffuse soft tissue edema is again noted, raising concern for anasarca. This is mildly improved from the prior study.  No acute osseous abnormalities are identified. Diffuse uniform sclerotic change within the visualized osseous structures reflects renal osteodystrophy.  IMPRESSION: 1. Significantly increased moderate to large volume abdominopelvic ascites. 2. 1.9 cm focus of decreased attenuation within the right hepatic lobe, with overlying retraction of the hepatic capsule, appears to have increased in size from the prior study. Malignancy cannot be excluded. Would correlate with LFTs and consider  dynamic liver protocol CT for further evaluation. 3. Splenomegaly and hepatomegaly noted. 4. Cardiomegaly noted.  Diffuse coronary artery calcifications seen. 5. Scattered calcified enlarged hilar and mediastinal nodes again noted, measuring up to 3.0 cm in short axis. These were present in 2014, and thought to reflect remote granulomatous disease. 6. Mild patchy bilateral atelectasis noted. 7. Severe bilateral renal atrophy, with a few scattered renal cysts. 8. Scattered calcification along the abdominal aorta and its branches. 9. Mildly enlarged bilateral inguinal nodes, measuring up to 1.5 cm in short axis. This may partially reflect underlying soft tissue edema. 10. Diffuse soft tissue edema, raising concern for anasarca; this is mildly improved from the prior study. 11. Findings of renal osteodystrophy.   Electronically Signed   By: Roanna Raider M.D.   On: 08/30/2014 07:44   Dg Chest 2 View  08/30/2014   CLINICAL DATA:  Acute onset of shortness of breath and abdominal distention. Initial encounter.  EXAM: CHEST  2 VIEW  COMPARISON:  Chest radiograph performed 01/16/2014, and CT of the chest performed  09/01/2013  FINDINGS: The lungs are hypoexpanded. Bibasilar airspace opacities may reflect multifocal pneumonia or pulmonary edema. There is no evidence of pleural effusion or pneumothorax.  The heart is enlarged.  No acute osseous abnormalities are seen.  IMPRESSION: Lungs hypoexpanded. Bibasilar airspace opacities may reflect multifocal pneumonia or mild pulmonary edema. Cardiomegaly noted.   Electronically Signed   By: Roanna Raider M.D.   On: 08/30/2014 05:41   US Abdomen Complete  08/30/2014   CLINICAL DATA:  Abnormal liver function tests.  Ascites.  EXAM: ULTRASOUND ABDOMEN COMPLETE  COMPARISON:  None.  FINDINGS: Gallbladder: Cholelithiasis. Gallbladder wall thickening measuring 9.1 mm. Negative sonographic Murphy's sign. Hyperechoic foci along the gallbladder wall with ring down artifact as can be  seen with adenomyomatosis.  Common bile duct: Diameter: 5.5 mm  Liver: No focal lesion identified. Increased hepatic parenchymal echogenicity. There is hepatomegaly.  IVC: No abnormality visualized.  Pancreas: Visualized portion unremarkable.  Spleen: The spleen is enlarged measuring 720 mL. No focal splenic lesion.  Right Kidney: Length: 7.1 cm. Echogenicity within normal limits. No mass or hydronephrosis visualized.  Left Kidney: Length: 5.8 cm. There is an anechoic left renal mass with increased through transmission measuring 13 x 17 mm most consistent with a cyst. Echogenicity within normal limits. No hydronephrosis visualized.  Abdominal aorta: The proximal abdominal aorta measures 3 cm in AP diameter.  Other findings: There is moderate abdominal ascites.  IMPRESSION: 1. Cholelithiasis. Gallbladder wall thickening which may be secondary to intrinsic gallbladder wall abnormality suggest cholecystitis, but this appearance can be seen in the setting of hepatocellular disease and ascites. 2. Atrophic kidneys. 3. Splenomegaly. 4. The hypodense mass seen in the right hepatic lobe adjacent to the liver capsule on CT abdomen performed same day is not visualized on the ultrasound. Further evaluation with a CT or MRI of the abdomen is recommended optimized for evaluation of the liver. 5. Proximal abdominal aorta measures 3 cm in AP diameter. Recommend followup by [place] in 3 years. This recommendation follows ACR consensus guidelines: White Paper of the ACR Incidental Findings Committee II on Vascular Findings. Earlyne Iba Radiol 2013; 40:981-191   Electronically Signed   By: Elige Ko   On: 08/30/2014 17:55   Ct Abdomen Pelvis W Contrast  08/30/2014   CLINICAL DATA:  Abdominal tenderness in distension.  Liver lesion  EXAM: CT ABDOMEN AND PELVIS WITH CONTRAST  TECHNIQUE: Multidetector CT imaging of the abdomen and pelvis was performed using the standard protocol following bolus administration of intravenous contrast.   CONTRAST:  80mL OMNIPAQUE IOHEXOL 300 MG/ML  SOLN  COMPARISON:  CT 08/30/2014 at 6:30 a.m.  FINDINGS: Lower chest: Interlobular septal thickening at the lung bases. No pleural fluid. Heart is enlarged. Pericardial fluid.  Hepatobiliary: There is reflux of contrast into the hepatic veins suggesting right heart dysfunction. Moderate volume of fluid surrounding the margin of the liver similar to prior. Slight decrease in the fluid in the pelvis. There is mild traction of the right hepatic lobe not changed from prior with a small hypodense lesions. Gallbladder is collapsed.  Pancreas: Difficult to evaluate the intra-abdominal organs due to ascites. No gross abnormality pancreas.  Spleen: Spleen is enlarged.  Adrenals/urinary tract: Adrenal is normal. Kidneys are atrophic. Bladder is collapsed. No your ureter abnormality  Stomach/Bowel: Stomach, small bowel, colon are unremarkable. There is progression of the oral contrast from the prior scan into the descending colon rectum.  Vascular/Lymphatic: Abdominal aorta is normal caliber no retroperitoneal periportal lymphadenopathy.  Reproductive: Prostate  gland is normal.  Musculoskeletal: There is diffuse gross bone consistent with renal osteodystrophy.  Other: Anasarca  IMPRESSION: 1. Interval decrease in volume of intraperitoneal free fluid volume paracentesis. Moderate volume of ascites remains. 2. No other significant interval change in short interval follow-up from 6:30 a.m. same day 3. No evidence of bowel obstruction. 4. Splenomegaly 5. Evidence of right heart dysfunction with reflux of contrast into hepatic veins. 6. Interlobular septal thickening at the lung bases consistent with interstitial edema.   Electronically Signed   By: Genevive Bi M.D.   On: 08/30/2014 14:42   US Paracentesis  08/30/2014   INDICATION: Ascites.  EXAM: ULTRASOUND-GUIDED PARACENTESIS  COMPARISON:  None.  MEDICATIONS: 10 cc 1% lidocaine  COMPLICATIONS: None immediate  TECHNIQUE:  Informed written consent was obtained from the patient after a discussion of the risks, benefits and alternatives to treatment. A timeout was performed prior to the initiation of the procedure.  Initial ultrasound scanning demonstrates a large amount of ascites within the left lower abdominal quadrant. The left Lower abdomen was prepped and draped in the usual sterile fashion. 1% lidocaine with epinephrine was used for local anesthesia. Under direct ultrasound guidance, a 19 gauge, 7-cm, Yueh catheter was introduced. An ultrasound image was saved for documentation purposed. the paracentesis was performed. The catheter was removed and a dressing was applied. The patient tolerated the procedure well without immediate post procedural complication.  FINDINGS: A total of approximately 1.2 liters of amber fluid was removed. Samples were sent to the laboratory as requested by the clinical team.  IMPRESSION: Successful ultrasound-guided paracentesis yielding 1.2 liters of peritoneal fluid.  Read by Robet Leu [facility]   Electronically Signed   By: Richarda Overlie M.D.   On: 08/30/2014 16:27     Medications:     . antiseptic oral rinse  7 mL Mouth Rinse BID  . aspirin EC  81 mg Oral Daily  . feeding supplement (NEPRO CARB STEADY)  237 mL Oral Daily  . labetalol  200 mg Oral BID  . multivitamin  1 tablet Oral QHS  . sevelamer carbonate  3,200 mg Oral TID WC  . sodium chloride  3 mL Intravenous Q12H  . sodium chloride  3 mL Intravenous Q12H  . triamcinolone ointment  1 application Topical Daily   sodium chloride, acetaminophen **OR** acetaminophen, albuterol, morphine injection, ondansetron **OR** ondansetron (ZOFRAN) IV, oxyCODONE, sevelamer carbonate, sodium chloride  Assessment/ Plan:   ESRD  MWF dialysis  ANEMIA- stable  MBD- follow calcium/ phophorus  HTN/VOL- lower BP  ACCESS- AVF   OTHER- ascites and edema    LOS: 1 Haiden Rawlinson W @TODAY @2 :24 PM

## 2014-08-31 NOTE — Progress Notes (Signed)
PROGRESS NOTE    Trevor [name] ZOX:[id number] DOB: 04/20/1951 DOA: 08/30/2014 PCP: [name], MD  HPI/Brief narrative 63 year old male patient with history of ESRD on HD MWF, chronic systolic CHF, A. 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 He had vigorous coughing spells and passed out. He saw his cardiologist and was advised to come to the hospital. He underwent diagnostic and therapeutic paracentesis by IR. Nephrology consulting for dialysis needs. GI consulted.   Assessment/Plan:  1. Massive ascites:? Etiology. Does have some features of generalized volume overload.? Cirrhosis? Right heart failure. Status post 1.2 L paracentesis by IR on 12/23. [facility] consulted for assistance with evaluation and management. Ascitic fluid not in keeping with SBP.  2. Chronic abdominal pain/hepatosplenomegaly: Unclear etiology. Await GI input. 3. ESRD on MWF HD: Management per nephrology.? Increased volume removal across dialysis. 4. Liver lesion seen on CT but not ultrasound:? MRI of liver. Await GI input. 5. Syncope: Possibly cough syncope. Initial troponin minimally elevated which could be secondary to renal failure but subsequent troponins have been negative. No clinical pneumonia. 6. History of chronic A. fib: Rate controlled in the 90s. Warfarin anticoagulation discontinued secondary to spontaneous back hematoma and patient has declined anticoagulation since. 7. History of essential hypertension: Mildly uncontrolled. Monitor 8. 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 History of chronic systolic CHF: Volume overloaded-management of chronic dialysis. LVEF by echo on 05/09/14 showed EF of 35-40 percent with diffuse hypokinesis. Will repeat 2-D  echo. 10. Leukopenia and thrombocytopenia:? Secondary to liver disease. Follow CBCs.  Code Status: Full  Family Communication: None at bedside Disposition Plan: Home when medically stable   Consultants:  Nephrology  Gastroenterology  Procedures:  Paracentesis 1.2 L by IR on 12/23  Antibiotics:  None   Subjective: Abdominal distention feels better compared to admission. Denies dyspnea.  Objective: Filed Vitals:   08/31/14 0044 08/31/14 0607 08/31/14 1545 08/31/14 1600  BP: 132/94 115/86 101/81 127/100  Pulse: 105 95 113 89  Temp:  98.6 F (37 C)    TempSrc:  Oral    Resp:  22 18 20   Height:  6\' 1"  (1.854 m)    Weight:  98.612 kg (217 lb 6.4 oz)    SpO2: 99% 98%      Intake/Output Summary (Last 24 hours) at 08/31/14 1638 Last data filed at 08/31/14 0904  Gross per 24 hour  Intake    480 ml  Output   1915 ml  Net  -1435 ml   Filed Weights   08/30/14 1535 08/30/14 1917 08/31/14 0607  Weight: 100.4 kg (221 lb 5.5 oz) 98.5 kg (217 lb 2.5 oz) 98.612 kg (217 lb 6.4 oz)     Exam:  General exam: Moderately built and nourished middle-aged male lying comfortably propped up in bed. Respiratory system: Slightly diminished breath sounds in the bases but otherwise clear to auscultation. No increased work of breathing. Cardiovascular system: S1 & S2 heard, irregularly irregular. No JVD, murmurs, gallops, clicks. Trace bilateral ankle edema. Telemetry: A. fib with controlled ventricular rate in the 90s Gastrointestinal system: Abdomen is moderately distended, soft and nontender. Ascites + +. Normal bowel sounds heard. Central nervous system: Alert and oriented. No focal neurological deficits. Extremities: Symmetric 5 x 5 power.   Data Reviewed: Basic Metabolic Panel:  Recent Labs Lab  08/30/14 0402 08/31/14 0620  NA 135 134*  K 4.7 4.2  CL 97 99  CO2 25 22  GLUCOSE 84 78  BUN 46* 40*  CREATININE 6.65* 6.09*  CALCIUM 8.6 8.3*   Liver Function Tests:  Recent  Labs Lab 08/30/14 0402 08/31/14 0620  AST 42* 37  ALT 32 28  ALKPHOS 228* 225*  BILITOT 2.0* 2.0*  PROT 7.1 6.7  ALBUMIN 2.8* 2.6*   No results for input(s): LIPASE, AMYLASE in the last 168 hours. No results for input(s): AMMONIA in the last 168 hours. CBC:  Recent Labs Lab 08/30/14 0402 08/31/14 0620  WBC 2.4* 3.0*  NEUTROABS 1.5*  --   HGB 13.2 13.5  HCT 38.0* 38.1*  MCV 82.6 80.2  PLT 74* 72*   Cardiac Enzymes:  Recent Labs Lab 08/30/14 1034 08/30/14 1546 08/30/14 2146  TROPONINI 0.04* <0.03 0.03   BNP (last 3 results)  Recent Labs  08/29/14 1421  PROBNP 1830.0*   CBG: No results for input(s): GLUCAP in the last 168 hours.  Recent Results (from the past 240 hour(s))  Body fluid culture     Status: None (Preliminary result)   Collection Time: 08/30/14  1:27 PM  Result Value Ref Range Status   Specimen Description FLUID ABDOMEN ASCITIC  Final   Special Requests NONE  Final   Gram Stain   Final    NO WBC SEEN NO ORGANISMS SEEN Performed at [facility]    Culture   Final    NO GROWTH 1 DAY Performed at [facility]    Report Status PENDING  Incomplete  AFB culture with smear     Status: None (Preliminary result)   Collection Time: 08/30/14  1:27 PM  Result Value Ref Range Status   Specimen Description FLUID ABDOMEN ASCITIC  Final   Special Requests NONE  Final   Acid Fast Smear   Final    NO ACID FAST BACILLI SEEN Performed at [facility]    Culture   Final    CULTURE WILL BE EXAMINED FOR 6 WEEKS BEFORE ISSUING A FINAL REPORT Performed at [facility]    Report Status PENDING  Incomplete          Studies: Ct Abdomen Pelvis Wo Contrast  08/30/2014   CLINICAL DATA:  Worsening abdominal distention and generalized abdominal pain, chronic in nature. Initial encounter.  EXAM: CT ABDOMEN AND PELVIS WITHOUT CONTRAST  TECHNIQUE: Multidetector CT imaging of the abdomen and pelvis was performed following the  standard protocol without IV contrast.  COMPARISON:  CT of the abdomen and pelvis from 03/26/2014  FINDINGS: Mild patchy bilateral atelectasis is noted. Diffuse coronary artery calcifications are seen. Scattered calcified enlarged hilar and mediastinal nodes are again seen, measuring up to 3.0 cm in short axis. Cardiomegaly is noted.  There is significantly increased moderate to large volume ascites within the abdomen and pelvis.  Hepatomegaly is again noted. A 1.9 cm focus of decreased attenuation within the right hepatic lobe, with overlying retraction of the hepatic capsule, appears to have increased in size from the prior study. Malignancy cannot be excluded. Evaluation for additional hepatic masses is limited without contrast. The spleen is enlarged, as on the prior study, measuring 13.8 cm in length.  The pancreas and adrenal glands are unremarkable in appearance.  Severe bilateral renal atrophy is noted, with a few scattered renal cysts. Nonspecific perinephric stranding is noted bilaterally. There is no evidence of hydronephrosis. No renal or ureteral stones  are seen.  The small bowel is unremarkable in appearance. The stomach is within normal limits. No acute vascular abnormalities are seen. Scattered calcification is seen along the abdominal aorta and its branches. The vasculature is difficult to fully assess without contrast.  The appendix is not definitely characterized given surrounding ascites. The colon is partially filled with stool and is grossly unremarkable in appearance, though difficult to fully characterize due to surrounding ascites.  The bladder is mildly distended and unremarkable in appearance. The prostate remains normal in size, with scattered calcification. Mildly enlarged bilateral inguinal nodes are seen, measuring up to 1.5 cm; some of this may reflect underlying soft tissue edema.  Diffuse soft tissue edema is again noted, raising concern for anasarca. This is mildly improved from  the prior study.  No acute osseous abnormalities are identified. Diffuse uniform sclerotic change within the visualized osseous structures reflects renal osteodystrophy.  IMPRESSION: 1. Significantly increased moderate to large volume abdominopelvic ascites. 2. 1.9 cm focus of decreased attenuation within the right hepatic lobe, with overlying retraction of the hepatic capsule, appears to have increased in size from the prior study. Malignancy cannot be excluded. Would correlate with LFTs and consider dynamic liver protocol CT for further evaluation. 3. Splenomegaly and hepatomegaly noted. 4. Cardiomegaly noted.  Diffuse coronary artery calcifications seen. 5. Scattered calcified enlarged hilar and mediastinal nodes again noted, measuring up to 3.0 cm in short axis. These were present in 2014, and thought to reflect remote granulomatous disease. 6. Mild patchy bilateral atelectasis noted. 7. Severe bilateral renal atrophy, with a few scattered renal cysts. 8. Scattered calcification along the abdominal aorta and its branches. 9. Mildly enlarged bilateral inguinal nodes, measuring up to 1.5 cm in short axis. This may partially reflect underlying soft tissue edema. 10. Diffuse soft tissue edema, raising concern for anasarca; this is mildly improved from the prior study. 11. Findings of renal osteodystrophy.   Electronically Signed   By: Roanna RaiderJeffery  Chang M.D.   On: 08/30/2014 07:44   Dg Chest 2 View  08/30/2014   CLINICAL DATA:  Acute onset of shortness of breath and abdominal distention. Initial encounter.  EXAM: CHEST  2 VIEW  COMPARISON:  Chest radiograph performed 01/16/2014, and CT of the chest performed 09/01/2013  FINDINGS: The lungs are hypoexpanded. Bibasilar airspace opacities may reflect multifocal pneumonia or pulmonary edema. There is no evidence of pleural effusion or pneumothorax.  The heart is enlarged.  No acute osseous abnormalities are seen.  IMPRESSION: Lungs hypoexpanded. Bibasilar airspace  opacities may reflect multifocal pneumonia or mild pulmonary edema. Cardiomegaly noted.   Electronically Signed   By: Roanna RaiderJeffery  Chang M.D.   On: 08/30/2014 05:41   [place] Abdomen Complete  08/30/2014   CLINICAL DATA:  Abnormal liver function tests.  Ascites.  EXAM: ULTRASOUND ABDOMEN COMPLETE  COMPARISON:  None.  FINDINGS: Gallbladder: Cholelithiasis. Gallbladder wall thickening measuring 9.1 mm. Negative sonographic Murphy's sign. Hyperechoic foci along the gallbladder wall with ring down artifact as can be seen with adenomyomatosis.  Common bile duct: Diameter: 5.5 mm  Liver: No focal lesion identified. Increased hepatic parenchymal echogenicity. There is hepatomegaly.  IVC: No abnormality visualized.  Pancreas: Visualized portion unremarkable.  Spleen: The spleen is enlarged measuring 720 mL. No focal splenic lesion.  Right Kidney: Length: 7.1 cm. Echogenicity within normal limits. No mass or hydronephrosis visualized.  Left Kidney: Length: 5.8 cm. There is an anechoic left renal mass with increased through transmission measuring 13 x 17 mm most consistent with a cyst. Echogenicity  within normal limits. No hydronephrosis visualized.  Abdominal aorta: The proximal abdominal aorta measures 3 cm in AP diameter.  Other findings: There is moderate abdominal ascites.  IMPRESSION: 1. Cholelithiasis. Gallbladder wall thickening which may be secondary to intrinsic gallbladder wall abnormality suggest cholecystitis, but this appearance can be seen in the setting of hepatocellular disease and ascites. 2. Atrophic kidneys. 3. Splenomegaly. 4. The hypodense mass seen in the right hepatic lobe adjacent to the liver capsule on CT abdomen performed same day is not visualized on the ultrasound. Further evaluation with a CT or MRI of the abdomen is recommended optimized for evaluation of the liver. 5. Proximal abdominal aorta measures 3 cm in AP diameter. Recommend followup by [place] in 3 years. This recommendation follows ACR  consensus guidelines: White Paper of the ACR Incidental Findings Committee II on Vascular Findings. Earlyne Iba Radiol 2013; 50:277-412   Electronically Signed   By: Elige Ko   On: 08/30/2014 17:55   Ct Abdomen Pelvis W Contrast  08/30/2014   CLINICAL DATA:  Abdominal tenderness in distension.  Liver lesion  EXAM: CT ABDOMEN AND PELVIS WITH CONTRAST  TECHNIQUE: Multidetector CT imaging of the abdomen and pelvis was performed using the standard protocol following bolus administration of intravenous contrast.  CONTRAST:  17mL OMNIPAQUE IOHEXOL 300 MG/ML  SOLN  COMPARISON:  CT 08/30/2014 at 6:30 a.m.  FINDINGS: Lower chest: Interlobular septal thickening at the lung bases. No pleural fluid. Heart is enlarged. Pericardial fluid.  Hepatobiliary: There is reflux of contrast into the hepatic veins suggesting right heart dysfunction. Moderate volume of fluid surrounding the margin of the liver similar to prior. Slight decrease in the fluid in the pelvis. There is mild traction of the right hepatic lobe not changed from prior with a small hypodense lesions. Gallbladder is collapsed.  Pancreas: Difficult to evaluate the intra-abdominal organs due to ascites. No gross abnormality pancreas.  Spleen: Spleen is enlarged.  Adrenals/urinary tract: Adrenal is normal. Kidneys are atrophic. Bladder is collapsed. No your ureter abnormality  Stomach/Bowel: Stomach, small bowel, colon are unremarkable. There is progression of the oral contrast from the prior scan into the descending colon rectum.  Vascular/Lymphatic: Abdominal aorta is normal caliber no retroperitoneal periportal lymphadenopathy.  Reproductive: Prostate gland is normal.  Musculoskeletal: There is diffuse gross bone consistent with renal osteodystrophy.  Other: Anasarca  IMPRESSION: 1. Interval decrease in volume of intraperitoneal free fluid volume paracentesis. Moderate volume of ascites remains. 2. No other significant interval change in short interval follow-up  from 6:30 a.m. same day 3. No evidence of bowel obstruction. 4. Splenomegaly 5. Evidence of right heart dysfunction with reflux of contrast into hepatic veins. 6. Interlobular septal thickening at the lung bases consistent with interstitial edema.   Electronically Signed   By: Genevive Bi M.D.   On: 08/30/2014 14:42   US Paracentesis  08/30/2014   INDICATION: Ascites.  EXAM: ULTRASOUND-GUIDED PARACENTESIS  COMPARISON:  None.  MEDICATIONS: 10 cc 1% lidocaine  COMPLICATIONS: None immediate  TECHNIQUE: Informed written consent was obtained from the patient after a discussion of the risks, benefits and alternatives to treatment. A timeout was performed prior to the initiation of the procedure.  Initial ultrasound scanning demonstrates a large amount of ascites within the left lower abdominal quadrant. The left Lower abdomen was prepped and draped in the usual sterile fashion. 1% lidocaine with epinephrine was used for local anesthesia. Under direct ultrasound guidance, a 19 gauge, 7-cm, Yueh catheter was introduced. An ultrasound image was saved  for documentation purposed. the paracentesis was performed. The catheter was removed and a dressing was applied. The patient tolerated the procedure well without immediate post procedural complication.  FINDINGS: A total of approximately 1.2 liters of amber fluid was removed. Samples were sent to the laboratory as requested by the clinical team.  IMPRESSION: Successful ultrasound-guided paracentesis yielding 1.2 liters of peritoneal fluid.  Read by Robet Leu [facility]   Electronically Signed   By: Richarda Overlie M.D.   On: 08/30/2014 16:27        Scheduled Meds: . antiseptic oral rinse  7 mL Mouth Rinse BID  . aspirin EC  81 mg Oral Daily  . feeding supplement (NEPRO CARB STEADY)  237 mL Oral Daily  . labetalol  200 mg Oral BID  . multivitamin  1 tablet Oral QHS  . sevelamer carbonate  3,200 mg Oral TID WC  . sodium chloride  3 mL Intravenous Q12H  . sodium  chloride  3 mL Intravenous Q12H  . triamcinolone ointment  1 application Topical Daily   Continuous Infusions:   Principal Problem:   Ascites Active Problems:   Atrial fibrillation   ESRD on hemodialysis   Cough syncope   Thrombocytopenia   Leukopenia   Abnormal LFTs   Pedal edema    Time spent: 45 minutes.    Marcellus Scott, MD, FACP, FHM. [facility] Pager [phone]  If 7PM-7AM, please contact night-coverage [email or web address] Password [facility] 08/31/2014, 4:38 PM    LOS: 1 day

## 2014-08-31 NOTE — Progress Notes (Signed)
Utilization review completed. Suellen Durocher, RN, BSN. 

## 2014-09-01 LAB — COMPREHENSIVE METABOLIC PANEL
ALBUMIN: 2.7 g/dL — AB (ref 3.5–5.2)
ALK PHOS: 225 U/L — AB (ref 39–117)
ALT: 28 U/L (ref 0–53)
AST: 41 U/L — AB (ref 0–37)
Anion gap: 12 (ref 5–15)
BILIRUBIN TOTAL: 2.3 mg/dL — AB (ref 0.3–1.2)
BUN: 31 mg/dL — ABNORMAL HIGH (ref 6–23)
CO2: 25 mmol/L (ref 19–32)
Calcium: 8.5 mg/dL (ref 8.4–10.5)
Chloride: 100 mEq/L (ref 96–112)
Creatinine, Ser: 5.37 mg/dL — ABNORMAL HIGH (ref 0.50–1.35)
GFR calc Af Amer: 12 mL/min — ABNORMAL LOW (ref >90)
GFR calc non Af Amer: 10 mL/min — ABNORMAL LOW (ref >90)
Glucose, Bld: 109 mg/dL — ABNORMAL HIGH (ref 70–99)
POTASSIUM: 3.8 mmol/L (ref 3.5–5.1)
Sodium: 137 mmol/L (ref 135–145)
TOTAL PROTEIN: 6.9 g/dL (ref 6.0–8.3)

## 2014-09-01 LAB — CBC
HCT: 37.1 % — ABNORMAL LOW (ref 39.0–52.0)
Hemoglobin: 13 g/dL (ref 13.0–17.0)
MCH: 28.9 pg (ref 26.0–34.0)
MCHC: 35 g/dL (ref 30.0–36.0)
MCV: 82.4 fL (ref 78.0–100.0)
PLATELETS: 79 10*3/uL — AB (ref 150–400)
RBC: 4.5 MIL/uL (ref 4.22–5.81)
RDW: 18.4 % — AB (ref 11.5–15.5)
WBC: 2.8 10*3/uL — ABNORMAL LOW (ref 4.0–10.5)

## 2014-09-01 MED ORDER — SENNOSIDES-DOCUSATE SODIUM 8.6-50 MG PO TABS
2.0000 | ORAL_TABLET | Freq: Two times a day (BID) | ORAL | Status: DC
  Administered 2014-09-01 – 2014-09-12 (×21): 2 via ORAL
  Filled 2014-09-01 (×21): qty 2

## 2014-09-01 MED ORDER — POLYETHYLENE GLYCOL 3350 17 G PO PACK
17.0000 g | PACK | Freq: Once | ORAL | Status: AC
  Administered 2014-09-01: 17 g via ORAL
  Filled 2014-09-01: qty 1

## 2014-09-01 MED ORDER — SIMETHICONE 80 MG PO CHEW
160.0000 mg | CHEWABLE_TABLET | Freq: Once | ORAL | Status: AC
  Administered 2014-09-01: 160 mg via ORAL
  Filled 2014-09-01: qty 2

## 2014-09-01 MED ORDER — SENNOSIDES-DOCUSATE SODIUM 8.6-50 MG PO TABS
1.0000 | ORAL_TABLET | Freq: Once | ORAL | Status: AC
  Administered 2014-09-01: 1 via ORAL
  Filled 2014-09-01: qty 1

## 2014-09-01 NOTE — Progress Notes (Signed)
Pt insist on getting Magnesium Citrate; Dr. Waymon Amato informed and he asked RN to clarify with Dr. Hyman Hopes; Dr. Hyman Hopes paged and awaiting on return call back. Arabella Merles Armas Mcbee RN.

## 2014-09-01 NOTE — Progress Notes (Signed)
[facility] ROUNDING NOTE   Subjective:   Interval History: somewhat better but still anasarca and ascites   Objective:  Vital signs in last 24 hours:  Temp:  [97.8 F (36.6 C)-99.6 F (37.6 C)] 99.6 F (37.6 C) (12/25 1020) Pulse Rate:  [76-113] 90 (12/25 1020) Resp:  [16-30] 18 (12/25 1020) BP: (101-146)/(77-112) 115/80 mmHg (12/25 1020) SpO2:  [93 %-99 %] 99 % (12/25 0542) Weight:  [95 kg (209 lb 7 oz)-99 kg (218 lb 4.1 oz)] 96.389 kg (212 lb 8 oz) (12/25 0500)  Weight change: -1.3 kg (-2 lb 13.9 oz) Filed Weights   08/31/14 1515 08/31/14 1856 09/01/14 0500  Weight: 99 kg (218 lb 4.1 oz) 95 kg (209 lb 7 oz) 96.389 kg (212 lb 8 oz)    Intake/Output: I/O last 3 completed shifts: In: 480 [P.O.:480] Out: 5915 [Other:5915]   Intake/Output this shift:  Total I/O In: 120 [P.O.:120] Out: -   CVS- RRR RS- CTA ABD- BS present   distended EXT- no edema   Basic Metabolic Panel:  Recent Labs Lab 08/30/14 0402 08/31/14 0620 09/01/14 0555  NA 135 134* 137  K 4.7 4.2 3.8  CL 97 99 100  CO2 25 22 25   GLUCOSE 84 78 109*  BUN 46* 40* 31*  CREATININE 6.65* 6.09* 5.37*  CALCIUM 8.6 8.3* 8.5    Liver Function Tests:  Recent Labs Lab 08/30/14 0402 08/31/14 0620 09/01/14 0555  AST 42* 37 41*  ALT 32 28 28  ALKPHOS 228* 225* 225*  BILITOT 2.0* 2.0* 2.3*  PROT 7.1 6.7 6.9  ALBUMIN 2.8* 2.6* 2.7*   No results for input(s): LIPASE, AMYLASE in the last 168 hours. No results for input(s): AMMONIA in the last 168 hours.  CBC:  Recent Labs Lab 08/30/14 0402 08/31/14 0620 09/01/14 0555  WBC 2.4* 3.0* 2.8*  NEUTROABS 1.5*  --   --   HGB 13.2 13.5 13.0  HCT 38.0* 38.1* 37.1*  MCV 82.6 80.2 82.4  PLT 74* 72* 79*    Cardiac Enzymes:  Recent Labs Lab 08/30/14 1034 08/30/14 1546 08/30/14 2146  TROPONINI 0.04* <0.03 0.03    BNP: Invalid input(s): POCBNP  CBG: No results for input(s): GLUCAP in the last 168  hours.  Microbiology: Results for orders placed or performed during the hospital encounter of 08/30/14  Body fluid culture     Status: None (Preliminary result)   Collection Time: 08/30/14  1:27 PM  Result Value Ref Range Status   Specimen Description FLUID ABDOMEN ASCITIC  Final   Special Requests NONE  Final   Gram Stain   Final    NO WBC SEEN NO ORGANISMS SEEN Performed at [facility]    Culture   Final    NO GROWTH 2 DAYS Performed at [facility]    Report Status PENDING  Incomplete  AFB culture with smear     Status: None (Preliminary result)   Collection Time: 08/30/14  1:27 PM  Result Value Ref Range Status   Specimen Description FLUID ABDOMEN ASCITIC  Final   Special Requests NONE  Final   Acid Fast Smear   Final    NO ACID FAST BACILLI SEEN Performed at [facility]    Culture   Final    CULTURE WILL BE EXAMINED FOR 6 WEEKS BEFORE ISSUING A FINAL REPORT Performed at [facility]    Report Status PENDING  Incomplete    Coagulation Studies:  Recent Labs  08/30/14 [id number]  LABPROT 14.8  INR 1.15    Urinalysis: No results for input(s): COLORURINE, LABSPEC, PHURINE, GLUCOSEU, HGBUR, BILIRUBINUR, KETONESUR, PROTEINUR, UROBILINOGEN, NITRITE, LEUKOCYTESUR in the last 72 hours.  Invalid input(s): APPERANCEUR    Imaging: US Abdomen Complete  08/30/2014   CLINICAL DATA:  Abnormal liver function tests.  Ascites.  EXAM: ULTRASOUND ABDOMEN COMPLETE  COMPARISON:  None.  FINDINGS: Gallbladder: Cholelithiasis. Gallbladder wall thickening measuring 9.1 mm. Negative sonographic Murphy's sign. Hyperechoic foci along the gallbladder wall with ring down artifact as can be seen with adenomyomatosis.  Common bile duct: Diameter: 5.5 mm  Liver: No focal lesion identified. Increased hepatic parenchymal echogenicity. There is hepatomegaly.  IVC: No abnormality visualized.  Pancreas: Visualized portion unremarkable.  Spleen: The spleen is enlarged  measuring 720 mL. No focal splenic lesion.  Right Kidney: Length: 7.1 cm. Echogenicity within normal limits. No mass or hydronephrosis visualized.  Left Kidney: Length: 5.8 cm. There is an anechoic left renal mass with increased through transmission measuring 13 x 17 mm most consistent with a cyst. Echogenicity within normal limits. No hydronephrosis visualized.  Abdominal aorta: The proximal abdominal aorta measures 3 cm in AP diameter.  Other findings: There is moderate abdominal ascites.  IMPRESSION: 1. Cholelithiasis. Gallbladder wall thickening which may be secondary to intrinsic gallbladder wall abnormality suggest cholecystitis, but this appearance can be seen in the setting of hepatocellular disease and ascites. 2. Atrophic kidneys. 3. Splenomegaly. 4. The hypodense mass seen in the right hepatic lobe adjacent to the liver capsule on CT abdomen performed same day is not visualized on the ultrasound. Further evaluation with a CT or MRI of the abdomen is recommended optimized for evaluation of the liver. 5. Proximal abdominal aorta measures 3 cm in AP diameter. Recommend followup by [place] in 3 years. This recommendation follows ACR consensus guidelines: White Paper of the ACR Incidental Findings Committee II on Vascular Findings. Earlyne Iba Radiol 2013; 21:308-657   Electronically Signed   By: Elige Ko   On: 08/30/2014 17:55   Ct Abdomen Pelvis W Contrast  08/30/2014   CLINICAL DATA:  Abdominal tenderness in distension.  Liver lesion  EXAM: CT ABDOMEN AND PELVIS WITH CONTRAST  TECHNIQUE: Multidetector CT imaging of the abdomen and pelvis was performed using the standard protocol following bolus administration of intravenous contrast.  CONTRAST:  80mL OMNIPAQUE IOHEXOL 300 MG/ML  SOLN  COMPARISON:  CT 08/30/2014 at 6:30 a.m.  FINDINGS: Lower chest: Interlobular septal thickening at the lung bases. No pleural fluid. Heart is enlarged. Pericardial fluid.  Hepatobiliary: There is reflux of contrast into the  hepatic veins suggesting right heart dysfunction. Moderate volume of fluid surrounding the margin of the liver similar to prior. Slight decrease in the fluid in the pelvis. There is mild traction of the right hepatic lobe not changed from prior with a small hypodense lesions. Gallbladder is collapsed.  Pancreas: Difficult to evaluate the intra-abdominal organs due to ascites. No gross abnormality pancreas.  Spleen: Spleen is enlarged.  Adrenals/urinary tract: Adrenal is normal. Kidneys are atrophic. Bladder is collapsed. No your ureter abnormality  Stomach/Bowel: Stomach, small bowel, colon are unremarkable. There is progression of the oral contrast from the prior scan into the descending colon rectum.  Vascular/Lymphatic: Abdominal aorta is normal caliber no retroperitoneal periportal lymphadenopathy.  Reproductive: Prostate gland is normal.  Musculoskeletal: There is diffuse gross bone consistent with renal osteodystrophy.  Other: Anasarca  IMPRESSION: 1. Interval decrease in volume of intraperitoneal free fluid volume paracentesis. Moderate volume of ascites remains. 2.  No other significant interval change in short interval follow-up from 6:30 a.m. same day 3. No evidence of bowel obstruction. 4. Splenomegaly 5. Evidence of right heart dysfunction with reflux of contrast into hepatic veins. 6. Interlobular septal thickening at the lung bases consistent with interstitial edema.   Electronically Signed   By: Genevive BiStewart  Edmunds M.D.   On: 08/30/2014 14:42   [place] Paracentesis  08/30/2014   INDICATION: Ascites.  EXAM: ULTRASOUND-GUIDED PARACENTESIS  COMPARISON:  None.  MEDICATIONS: 10 cc 1% lidocaine  COMPLICATIONS: None immediate  TECHNIQUE: Informed written consent was obtained from the patient after a discussion of the risks, benefits and alternatives to treatment. A timeout was performed prior to the initiation of the procedure.  Initial ultrasound scanning demonstrates a large amount of ascites within the left  lower abdominal quadrant. The left Lower abdomen was prepped and draped in the usual sterile fashion. 1% lidocaine with epinephrine was used for local anesthesia. Under direct ultrasound guidance, a 19 gauge, 7-cm, Yueh catheter was introduced. An ultrasound image was saved for documentation purposed. the paracentesis was performed. The catheter was removed and a dressing was applied. The patient tolerated the procedure well without immediate post procedural complication.  FINDINGS: A total of approximately 1.2 liters of amber fluid was removed. Samples were sent to the laboratory as requested by the clinical team.  IMPRESSION: Successful ultrasound-guided paracentesis yielding 1.2 liters of peritoneal fluid.  Read by Robet LeuPamela A Turpin [facility]   Electronically Signed   By: Richarda OverlieAdam  Henn M.D.   On: 08/30/2014 16:27     Medications:     . antiseptic oral rinse  7 mL Mouth Rinse BID  . aspirin EC  81 mg Oral Daily  . feeding supplement (NEPRO CARB STEADY)  237 mL Oral Daily  . labetalol  200 mg Oral BID  . multivitamin  1 tablet Oral QHS  . senna-docusate  2 tablet Oral BID  . sevelamer carbonate  3,200 mg Oral TID WC  . sodium chloride  3 mL Intravenous Q12H  . sodium chloride  3 mL Intravenous Q12H  . triamcinolone ointment  1 application Topical Daily   sodium chloride, acetaminophen **OR** acetaminophen, albuterol, morphine injection, ondansetron **OR** ondansetron (ZOFRAN) IV, oxyCODONE, sevelamer carbonate, sodium chloride  Assessment/ Plan:   ESRD MWF dialysis   Schedule treatment for Sunday 12/27  ANEMIA- stable Hb 13  MBD- follow calcium/ phophorus  HTN/VOL- lower BP  ACCESS- AVF   The etiology of ascites is elusive, it may be multifactorial and appreciate the thoughtful consideration by Dr Waymon AmatoHongalgi and Dr Arlyce DiceKaplan. We shall await further work up     LOS: 2 Angy Swearengin W @TODAY @12 :35 PM

## 2014-09-01 NOTE — Progress Notes (Signed)
PROGRESS NOTE    Trevor [name] CHE:[id number] DOB: [birth year] DOA: 08/30/2014 PCP: [name], MD  HPI/Brief narrative 63 year old male patient with history of ESRD on HD MWF, chronic systolic CHF, A. 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 He had vigorous coughing spells and passed out. He saw his cardiologist and was advised to come to the hospital. He underwent diagnostic and therapeutic paracentesis by IR. Nephrology consulting for dialysis needs. GI consulted.   Assessment/Plan:  1. Massive ascites:? Etiology. Does have some features of generalized volume overload.? Cirrhosis? Right heart failure. Status post 1.2 L paracentesis by IR on 12/23. [facility] consulted for assistance with evaluation and management. Ascitic fluid not in keeping with SBP. Discussed with nephrology-could be secondary to uremic peritonitis and will not be able to reduce a whole lot across dialysis. GI input appreciated-likely has cirrhosis with portal hypertension accounting for ascites and splenomegaly. Recommend checking AMA, ENA and transjugular liver biopsy? By IR 2. Chronic abdominal pain/hepatosplenomegaly: Unclear etiology. GI input appreciated. 3. ESRD on MWF HD: Management per nephrology.? Increased volume removal across dialysis. Discussed with nephrology 12/25. 4. Liver lesion seen on CT but not ultrasound:? MRI of liver. GI input appreciated. 5. Syncope: Possibly cough syncope. Initial troponin minimally elevated which could be secondary to renal failure but subsequent troponins have been negative. No clinical pneumonia. 6. History of chronic A. fib: Rate controlled in the 90s. Warfarin anticoagulation discontinued secondary to spontaneous back hematoma  and patient has declined anticoagulation since. 7. History of essential hypertension: Mildly uncontrolled. Monitor 8. 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 History of chronic systolic CHF: Volume overloaded-management across dialysis. LVEF by echo on 05/09/14 showed EF of 35-40 percent with diffuse hypokinesis. Will repeat 2-D echo. 10. Leukopenia and thrombocytopenia:? Secondary to liver disease. Follow CBCs. Stable. 11. Chronic cough: States that he has had it for a year and etiology is not clear.   Code Status: Full  Family Communication: None at bedside Disposition Plan: Home when medically stable   Consultants:  Nephrology  Gastroenterology  Procedures:  Paracentesis 1.2 L by IR on 12/23  Hemodialysis 12/24  Antibiotics:  None   Subjective: No new complaints. Denies dyspnea.  Objective: Filed Vitals:   08/31/14 2213 09/01/14 0500 09/01/14 0542 09/01/14 1020  BP: 115/92  124/86 115/80  Pulse: 98  78 90  Temp:   99.2 F (37.3 C) 99.6 F (37.6 C)  TempSrc:   Oral Oral  Resp:   16 18  Height:      Weight:  96.389 kg (212 lb 8 oz)    SpO2:   99%     Intake/Output Summary (Last 24 hours) at 09/01/14 1147 Last data filed at 09/01/14 1001  Gross per 24 hour  Intake    120 ml  Output   4000 ml  Net  -3880 ml   Filed Weights   08/31/14 1515 08/31/14 1856 09/01/14 0500  Weight: 99 kg (218 lb 4.1 oz) 95 kg (209 lb 7 oz) 96.389 kg (212 lb 8 oz)     Exam:  General exam: Moderately built and nourished middle-aged male sitting comfortably at edge of bed. Respiratory system: Slightly diminished breath sounds in the bases but otherwise clear to auscultation. No increased work of breathing. Cardiovascular system: S1 & S2 heard, irregularly  irregular. No JVD, murmurs, gallops, clicks. Trace bilateral ankle edema. Telemetry: A. fib with controlled ventricular rate in the 90s Gastrointestinal system: Abdomen is moderately distended, soft and nontender. Ascites +  +. Normal bowel sounds heard. Central nervous system: Alert and oriented. No focal neurological deficits. Extremities: Symmetric 5 x 5 power.   Data Reviewed: Basic Metabolic Panel:  Recent Labs Lab 08/30/14 0402 08/31/14 0620 09/01/14 0555  NA 135 134* 137  K 4.7 4.2 3.8  CL 97 99 100  CO2 25 22 25   GLUCOSE 84 78 109*  BUN 46* 40* 31*  CREATININE 6.65* 6.09* 5.37*  CALCIUM 8.6 8.3* 8.5   Liver Function Tests:  Recent Labs Lab 08/30/14 0402 08/31/14 0620 09/01/14 0555  AST 42* 37 41*  ALT 32 28 28  ALKPHOS 228* 225* 225*  BILITOT 2.0* 2.0* 2.3*  PROT 7.1 6.7 6.9  ALBUMIN 2.8* 2.6* 2.7*   No results for input(s): LIPASE, AMYLASE in the last 168 hours. No results for input(s): AMMONIA in the last 168 hours. CBC:  Recent Labs Lab 08/30/14 0402 08/31/14 0620 09/01/14 0555  WBC 2.4* 3.0* 2.8*  NEUTROABS 1.5*  --   --   HGB 13.2 13.5 13.0  HCT 38.0* 38.1* 37.1*  MCV 82.6 80.2 82.4  PLT 74* 72* 79*   Cardiac Enzymes:  Recent Labs Lab 08/30/14 1034 08/30/14 1546 08/30/14 2146  TROPONINI 0.04* <0.03 0.03   BNP (last 3 results)  Recent Labs  08/29/14 1421  PROBNP 1830.0*   CBG: No results for input(s): GLUCAP in the last 168 hours.  Recent Results (from the past 240 hour(s))  Body fluid culture     Status: None (Preliminary result)   Collection Time: 08/30/14  1:27 PM  Result Value Ref Range Status   Specimen Description FLUID ABDOMEN ASCITIC  Final   Special Requests NONE  Final   Gram Stain   Final    NO WBC SEEN NO ORGANISMS SEEN Performed at [facility]    Culture   Final    NO GROWTH 2 DAYS Performed at [facility]    Report Status PENDING  Incomplete  AFB culture with smear     Status: None (Preliminary result)   Collection Time: 08/30/14  1:27 PM  Result Value Ref Range Status   Specimen Description FLUID ABDOMEN ASCITIC  Final   Special Requests NONE  Final   Acid Fast Smear   Final    NO ACID FAST BACILLI  SEEN Performed at [facility]    Culture   Final    CULTURE WILL BE EXAMINED FOR 6 WEEKS BEFORE ISSUING A FINAL REPORT Performed at [facility]    Report Status PENDING  Incomplete          Studies: US Abdomen Complete  08/30/2014   CLINICAL DATA:  Abnormal liver function tests.  Ascites.  EXAM: ULTRASOUND ABDOMEN COMPLETE  COMPARISON:  None.  FINDINGS: Gallbladder: Cholelithiasis. Gallbladder wall thickening measuring 9.1 mm. Negative sonographic Murphy's sign. Hyperechoic foci along the gallbladder wall with ring down artifact as can be seen with adenomyomatosis.  Common bile duct: Diameter: 5.5 mm  Liver: No focal lesion identified. Increased hepatic parenchymal echogenicity. There is hepatomegaly.  IVC: No abnormality visualized.  Pancreas: Visualized portion unremarkable.  Spleen: The spleen is enlarged measuring 720 mL. No focal splenic lesion.  Right Kidney: Length: 7.1 cm. Echogenicity within normal limits. No mass or hydronephrosis visualized.  Left Kidney: Length: 5.8 cm. There is an  anechoic left renal mass with increased through transmission measuring 13 x 17 mm most consistent with a cyst. Echogenicity within normal limits. No hydronephrosis visualized.  Abdominal aorta: The proximal abdominal aorta measures 3 cm in AP diameter.  Other findings: There is moderate abdominal ascites.  IMPRESSION: 1. Cholelithiasis. Gallbladder wall thickening which may be secondary to intrinsic gallbladder wall abnormality suggest cholecystitis, but this appearance can be seen in the setting of hepatocellular disease and ascites. 2. Atrophic kidneys. 3. Splenomegaly. 4. The hypodense mass seen in the right hepatic lobe adjacent to the liver capsule on CT abdomen performed same day is not visualized on the ultrasound. Further evaluation with a CT or MRI of the abdomen is recommended optimized for evaluation of the liver. 5. Proximal abdominal aorta measures 3 cm in AP diameter. Recommend  followup by [place] in 3 years. This recommendation follows ACR consensus guidelines: White Paper of the ACR Incidental Findings Committee II on Vascular Findings. Earlyne IbaJ Am Coll Radiol 2013; 16:109-60410:789-794   Electronically Signed   By: Elige KoHetal  Patel   On: 08/30/2014 17:55   Ct Abdomen Pelvis W Contrast  08/30/2014   CLINICAL DATA:  Abdominal tenderness in distension.  Liver lesion  EXAM: CT ABDOMEN AND PELVIS WITH CONTRAST  TECHNIQUE: Multidetector CT imaging of the abdomen and pelvis was performed using the standard protocol following bolus administration of intravenous contrast.  CONTRAST:  80mL OMNIPAQUE IOHEXOL 300 MG/ML  SOLN  COMPARISON:  CT 08/30/2014 at 6:30 a.m.  FINDINGS: Lower chest: Interlobular septal thickening at the lung bases. No pleural fluid. Heart is enlarged. Pericardial fluid.  Hepatobiliary: There is reflux of contrast into the hepatic veins suggesting right heart dysfunction. Moderate volume of fluid surrounding the margin of the liver similar to prior. Slight decrease in the fluid in the pelvis. There is mild traction of the right hepatic lobe not changed from prior with a small hypodense lesions. Gallbladder is collapsed.  Pancreas: Difficult to evaluate the intra-abdominal organs due to ascites. No gross abnormality pancreas.  Spleen: Spleen is enlarged.  Adrenals/urinary tract: Adrenal is normal. Kidneys are atrophic. Bladder is collapsed. No your ureter abnormality  Stomach/Bowel: Stomach, small bowel, colon are unremarkable. There is progression of the oral contrast from the prior scan into the descending colon rectum.  Vascular/Lymphatic: Abdominal aorta is normal caliber no retroperitoneal periportal lymphadenopathy.  Reproductive: Prostate gland is normal.  Musculoskeletal: There is diffuse gross bone consistent with renal osteodystrophy.  Other: Anasarca  IMPRESSION: 1. Interval decrease in volume of intraperitoneal free fluid volume paracentesis. Moderate volume of ascites remains. 2. No  other significant interval change in short interval follow-up from 6:30 a.m. same day 3. No evidence of bowel obstruction. 4. Splenomegaly 5. Evidence of right heart dysfunction with reflux of contrast into hepatic veins. 6. Interlobular septal thickening at the lung bases consistent with interstitial edema.   Electronically Signed   By: Genevive BiStewart  Edmunds M.D.   On: 08/30/2014 14:42   [place] Paracentesis  08/30/2014   INDICATION: Ascites.  EXAM: ULTRASOUND-GUIDED PARACENTESIS  COMPARISON:  None.  MEDICATIONS: 10 cc 1% lidocaine  COMPLICATIONS: None immediate  TECHNIQUE: Informed written consent was obtained from the patient after a discussion of the risks, benefits and alternatives to treatment. A timeout was performed prior to the initiation of the procedure.  Initial ultrasound scanning demonstrates a large amount of ascites within the left lower abdominal quadrant. The left Lower abdomen was prepped and draped in the usual sterile fashion. 1% lidocaine with epinephrine was used for  local anesthesia. Under direct ultrasound guidance, a 19 gauge, 7-cm, Yueh catheter was introduced. An ultrasound image was saved for documentation purposed. the paracentesis was performed. The catheter was removed and a dressing was applied. The patient tolerated the procedure well without immediate post procedural complication.  FINDINGS: A total of approximately 1.2 liters of amber fluid was removed. Samples were sent to the laboratory as requested by the clinical team.  IMPRESSION: Successful ultrasound-guided paracentesis yielding 1.2 liters of peritoneal fluid.  Read by Robet Leu [facility]   Electronically Signed   By: Richarda Overlie M.D.   On: 08/30/2014 16:27        Scheduled Meds: . antiseptic oral rinse  7 mL Mouth Rinse BID  . aspirin EC  81 mg Oral Daily  . feeding supplement (NEPRO CARB STEADY)  237 mL Oral Daily  . labetalol  200 mg Oral BID  . multivitamin  1 tablet Oral QHS  . sevelamer carbonate  3,200 mg Oral  TID WC  . sodium chloride  3 mL Intravenous Q12H  . sodium chloride  3 mL Intravenous Q12H  . triamcinolone ointment  1 application Topical Daily   Continuous Infusions:   Principal Problem:   Ascites Active Problems:   Atrial fibrillation   ESRD on hemodialysis   Cough syncope   Thrombocytopenia   Leukopenia   Abnormal LFTs   Pedal edema   Abdominal pain, chronic, epigastric    Time spent: 45 minutes.    Marcellus Scott, MD, FACP, FHM. [facility] Pager [phone]  If 7PM-7AM, please contact night-coverage [email or web address] Password [email or web address] 09/01/2014, 11:47 AM    LOS: 2 days

## 2014-09-01 NOTE — Progress Notes (Signed)
Patient asked for an enema to relieve some gas that he is having. RN suggested starting with some gas X. MD paged

## 2014-09-01 NOTE — Progress Notes (Signed)
MD notified due to acute change in patient status. Vital signs and labs are listed below.  MD notified(1st page) Time of 1st page:  2214 Responding MD:  Camila Li Time MD responded: 2219 MD response: Continue to monitor pt.   Vital Signs Filed Vitals:   09/01/14 0542 09/01/14 1020 09/01/14 2150 09/01/14 2207  BP: 124/86 115/80 135/93   Pulse: 78 90 103 97  Temp: 99.2 F (37.3 C) 99.6 F (37.6 C) 98.5 F (36.9 C)   TempSrc: Oral Oral Oral   Resp: 16 18 20    Height:      Weight:      SpO2: 99%  98%      Lab Results WBC  Date/Time Value Ref Range Status  09/01/2014 05:55 AM 2.8* 4.0 - 10.5 K/uL Final  08/31/2014 06:20 AM 3.0* 4.0 - 10.5 K/uL Final  08/30/2014 04:02 AM 2.4* 4.0 - 10.5 K/uL Final  10/25/2009 09:33 AM 2.7* 4.0 - 10.3 10e3/uL Final  04/24/2009 11:37 AM 3.0* 4.0 - 10.3 10e3/uL Final  01/25/2009 12:59 PM 2.6* 4.0 - 10.3 10e3/uL Final   NEUT%  Date/Time Value Ref Range Status  10/25/2009 09:33 AM 51.4 39.0 - 75.0 % Final  04/24/2009 11:37 AM 53.5 39.0 - 75.0 % Final  01/25/2009 12:59 PM 51.3 39.0 - 75.0 % Final   NEUTROPHILS RELATIVE %  Date/Time Value Ref Range Status  08/30/2014 04:02 AM 63 43 - 77 % Final  03/26/2014 11:50 AM 72 43 - 77 % Final  09/01/2013 02:10 AM 52 43 - 77 % Final   No results found for: PCO2ART LACTIC ACID, VENOUS  Date/Time Value Ref Range Status  08/30/2014 04:12 AM 1.93 0.5 - 2.2 mmol/L Final  03/26/2014 12:02 PM 1.26 0.5 - 2.2 mmol/L Final  09/01/2013 02:30 AM 1.1 0.5 - 2.2 mmol/L Final   PCO2, VEN  Date/Time Value Ref Range Status  02/11/2007 07:54 AM 28.5*  Final     Sandrea Hughs, [place], RN 09/01/2014, 10:22 PM

## 2014-09-01 NOTE — Progress Notes (Signed)
Dr. Hyman Hopes returns callback; informs RN not to give pt the magnesium citrate; ordered Mg level in am; and RN to let pt know he will see him in the am. Pt informed and he voices understanding. Will continue to monitor pt and report off to incoming RN. Arabella Merles Star Cheese RN.

## 2014-09-02 ENCOUNTER — Inpatient Hospital Stay (HOSPITAL_COMMUNITY): Payer: Medicare Other

## 2014-09-02 DIAGNOSIS — I482 Chronic atrial fibrillation, unspecified: Secondary | ICD-10-CM | POA: Diagnosis present

## 2014-09-02 LAB — BODY FLUID CELL COUNT WITH DIFFERENTIAL
Eos, Fluid: 0 %
LYMPHS FL: 7 %
MONOCYTE-MACROPHAGE-SEROUS FLUID: 93 % — AB (ref 50–90)
Neutrophil Count, Fluid: 0 % (ref 0–25)
WBC FLUID: 276 uL (ref 0–1000)

## 2014-09-02 LAB — BODY FLUID CULTURE
Culture: NO GROWTH
Gram Stain: NONE SEEN

## 2014-09-02 LAB — MAGNESIUM: Magnesium: 2.4 mg/dL (ref 1.5–2.5)

## 2014-09-02 MED ORDER — GUAIFENESIN-CODEINE 100-10 MG/5ML PO SOLN
5.0000 mL | Freq: Four times a day (QID) | ORAL | Status: DC | PRN
  Administered 2014-09-02 – 2014-09-09 (×19): 5 mL via ORAL
  Filled 2014-09-02 (×18): qty 5

## 2014-09-02 MED ORDER — LIDOCAINE HCL (PF) 1 % IJ SOLN
INTRAMUSCULAR | Status: AC
Start: 2014-09-02 — End: 2014-09-03
  Filled 2014-09-02: qty 10

## 2014-09-02 NOTE — Procedures (Signed)
Successful US guided paracentesis from [facility].  Yielded 1.7 liters of serous/amber colored fluid.  No immediate complications.  Pt tolerated well.   Specimen was sent for labs.  Pattricia Boss D PA-C 09/02/2014 1:39 PM

## 2014-09-02 NOTE — Progress Notes (Signed)
     [facility] Progress Note  Subjective:  Still has abdominal pain, mostly mid-abdomen.  Pain definitely improved with paracentesis.  Objective:  Vital signs in last 24 hours: Temp:  [98.5 F (36.9 C)] 98.5 F (36.9 C) (12/25 2150) Pulse Rate:  [97-103] 97 (12/25 2207) Resp:  [20] 20 (12/25 2150) BP: (135)/(93) 135/93 mmHg (12/25 2150) SpO2:  [98 %] 98 % (12/25 2150) Weight:  [216 lb 14.9 oz (98.4 kg)] 216 lb 14.9 oz (98.4 kg) (12/26 0500) Last BM Date: 08/31/14 General:  Alert, chronically ill-appearing, in NAD Heart:  Regular rate and rhythm.  Distended jugular veins. Pulm:  CTAB.  No W/R/R. Abdomen:  Soft, distended with ascites fluid.  BS present.  Diffuse TTP.  Extremities:  Without edema. Neurologic:  Alert and  oriented x4;  grossly normal neurologically. Psych:  Alert and cooperative. Normal mood and affect.  Intake/Output from previous day: 12/25 0701 - 12/26 0700 In: 123 [P.O.:120; I.V.:3] Out: -  Intake/Output this shift: Total I/O In: 460 [P.O.:460] Out: -   Lab Results:  Recent Labs  08/31/14 0620 09/01/14 0555  WBC 3.0* 2.8*  HGB 13.5 13.0  HCT 38.1* 37.1*  PLT 72* 79*   BMET  Recent Labs  08/31/14 0620 09/01/14 0555  NA 134* 137  K 4.2 3.8  CL 99 100  CO2 22 25  GLUCOSE 78 109*  BUN 40* 31*  CREATININE 6.09* 5.37*  CALCIUM 8.3* 8.5   LFT  Recent Labs  09/01/14 0555  PROT 6.9  ALBUMIN 2.7*  AST 41*  ALT 28  ALKPHOS 225*  BILITOT 2.3*   Assessment / Plan: *Chronic abdominal pain. Although it began following his umbilical hernia repair, per the CT scan, there doesn't seem to be any obvious complication related to that surgery which would be a cause for the pain. ? due to hepatosplenomegaly and ascites induced pressure on the abdominal cavity?  Abdominal pain improved following paracentesis. *Hepatosplenomegaly, not new. No evidence of cirrhosis; though does have thrombocytopenia but not coagulopathy.  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  *Lesion in right lobe of liver. Not new. Felt to be hemangioma in past.  *Ascites. Not new but progressive. S/p paracentesis. WBC count of 240 is under the "cut off" of 250 or above that is definitive for ruling in SBP. On Gram stain no WBCs or organisms seen and no growth of organisms at day 2 of culture. Abdominal pain improved following paracentesis. *ESRD:  On HD. *Thrombocytopenia.  *Hx GERD. Not on PPI at home.  *Chronic cough. Amlodipine now discontinued. Chest x-ray shows bilateral opacities: Mild edema versus multifocal PNA. Has leukopenia and no fever. Not currently receiving antibiotics.  -HIDA scan was ordered by primary service and is pending. -Will order large volume paracentesis to see if this further improves his pain.   LOS: 3 days   ZEHR, JESSICA D.  09/02/2014, 11:07 AM   GI Attending Note  I have personally taken an interval history, reviewed the chart, and examined the patient.  I agree with the extender's note, impression and recommendations.  Barbette Hair. Arlyce Dice, MD, [facility] [phone]   Pager number [phone]

## 2014-09-02 NOTE — Progress Notes (Signed)
PROGRESS NOTE    Lanice SchwabKenny A Martin JYN:[id number] DOB: 04/14/1951 DOA: 08/30/2014 PCP: Gwynneth AlimentSANDERS,ROBYN N, MD  HPI/Brief narrative 63 year old male patient with history of ESRD on HD MWF, chronic systolic CHF, A. 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 He had vigorous coughing spells and passed out. He saw his cardiologist and was advised to come to the hospital. He underwent diagnostic and therapeutic paracentesis by IR. Nephrology consulting for dialysis needs. GI consulted.   Assessment/Plan:  1. Massive ascites:? Etiology. ? Cirrhosis? Right heart failure. Status post 1.2 L paracentesis by IR on 12/23 & 1.7 L on 12/26. Ascitic fluid not in keeping with SBP. Discussed with nephrology-could be secondary to uremic peritonitis and will not be able to reduce a whole lot across dialysis. GI input appreciated-likely has cirrhosis with portal hypertension accounting for ascites and splenomegaly. Recommend checking AMA, ENA and transjugular liver biopsy? By IR 2. Chronic abdominal pain/hepatosplenomegaly: Unclear etiology. States started after hernia repair but no postoperative complications.? Related to ascites-improved after tap. GI follow-up appreciated. 3. ESRD on MWF HD: Management per nephrology.? Increased volume removal across dialysis. Discussed with nephrology 12/25. 4. Liver lesion seen on CT but not ultrasound:? MRI of liver. GI input appreciated. 5. Syncope: Possibly cough syncope. Initial troponin minimally elevated which could be secondary to renal failure but subsequent troponins have been negative. No clinical pneumonia. 6. History of chronic A. fib: Rate controlled in the 90s. Warfarin anticoagulation discontinued secondary to spontaneous back  hematoma and patient has declined anticoagulation since. 7. History of essential hypertension: Mildly uncontrolled. Monitor 8. 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 History of chronic systolic CHF: Volume overloaded-management across dialysis. LVEF by echo on 05/09/14 showed EF of 35-40 percent with diffuse hypokinesis. Will repeat 2-D echo. 10. Leukopenia and thrombocytopenia:? Secondary to liver disease. Follow CBCs. Stable. 11. Chronic cough: States that he has had it for a year and etiology is not clear.   Code Status: Full  Family Communication: Discussed with patient's sister and extended family at bedside. Disposition Plan: Home when medically stable   Consultants:  Nephrology  Gastroenterology  Procedures:  Paracentesis 1.2 L by IR on 12/23  Hemodialysis 12/24  Antibiotics:  None   Subjective: Seen this morning. Denied any new complaints. Continued to have discomfort from abdominal distention.  Objective: Filed Vitals:   09/02/14 1320 09/02/14 1325 09/02/14 1330 09/02/14 1335  BP: 133/92 99/67 127/92 126/91  Pulse:      Temp:      TempSrc:      Resp:      Height:      Weight:      SpO2:       temperature 98.77F, pulse 97/m, respiration 20 per minute and saturating at 98%.  Intake/Output Summary (Last 24 hours) at 09/02/14 1412 Last data filed at 09/02/14 0900  Gross per 24 hour  Intake    463 ml  Output      0 ml  Net    463 ml   Filed Weights   08/31/14 1856 09/01/14 0500 09/02/14 0500  Weight: 95 kg (209 lb 7 oz) 96.389 kg (212 lb 8 oz) 98.4 kg (216 lb 14.9 oz)     Exam:  General exam: Moderately built and nourished middle-aged male sitting comfortably at edge of bed. Respiratory system:  otherwise clear to auscultation. No increased work of breathing. Cardiovascular system: S1 & S2 heard, irregularly irregular. No JVD, murmurs, gallops, clicks. Trace bilateral ankle edema. Telemetry: A. fib with controlled ventricular rate in the  90s Gastrointestinal system: Abdomen is moderately distended, soft and nontender. Ascites + +. Normal bowel sounds heard. Central nervous system: Alert and oriented. No focal neurological deficits. Extremities: Symmetric 5 x 5 power.   Data Reviewed: Basic Metabolic Panel:  Recent Labs Lab 08/30/14 0402 08/31/14 0620 09/01/14 0555 09/02/14 0552  NA 135 134* 137  --   K 4.7 4.2 3.8  --   CL 97 99 100  --   CO2 25 22 25   --   GLUCOSE 84 78 109*  --   BUN 46* 40* 31*  --   CREATININE 6.65* 6.09* 5.37*  --   CALCIUM 8.6 8.3* 8.5  --   MG  --   --   --  2.4   Liver Function Tests:  Recent Labs Lab 08/30/14 0402 08/31/14 0620 09/01/14 0555  AST 42* 37 41*  ALT 32 28 28  ALKPHOS 228* 225* 225*  BILITOT 2.0* 2.0* 2.3*  PROT 7.1 6.7 6.9  ALBUMIN 2.8* 2.6* 2.7*   No results for input(s): LIPASE, AMYLASE in the last 168 hours. No results for input(s): AMMONIA in the last 168 hours. CBC:  Recent Labs Lab 08/30/14 0402 08/31/14 0620 09/01/14 0555  WBC 2.4* 3.0* 2.8*  NEUTROABS 1.5*  --   --   HGB 13.2 13.5 13.0  HCT 38.0* 38.1* 37.1*  MCV 82.6 80.2 82.4  PLT 74* 72* 79*   Cardiac Enzymes:  Recent Labs Lab 08/30/14 1034 08/30/14 1546 08/30/14 2146  TROPONINI 0.04* <0.03 0.03   BNP (last 3 results)  Recent Labs  08/29/14 1421  PROBNP 1830.0*   CBG: No results for input(s): GLUCAP in the last 168 hours.  Recent Results (from the past 240 hour(s))  Body fluid culture     Status: None   Collection Time: 08/30/14  1:27 PM  Result Value Ref Range Status   Specimen Description FLUID ABDOMEN ASCITIC  Final   Special Requests NONE  Final   Gram Stain   Final    NO WBC SEEN NO ORGANISMS SEEN Performed at [facility]    Culture   Final    NO GROWTH 3 DAYS Performed at [facility]    Report Status 09/02/2014 FINAL  Final  AFB culture with smear     Status: None (Preliminary result)   Collection Time: 08/30/14  1:27 PM  Result Value  Ref Range Status   Specimen Description FLUID ABDOMEN ASCITIC  Final   Special Requests NONE  Final   Acid Fast Smear   Final    NO ACID FAST BACILLI SEEN Performed at [facility]    Culture   Final    CULTURE WILL BE EXAMINED FOR 6 WEEKS BEFORE ISSUING A FINAL REPORT Performed at [facility]    Report Status PENDING  Incomplete          Studies: No results found.      Scheduled Meds: . antiseptic oral rinse  7 mL Mouth Rinse BID  . aspirin EC  81 mg Oral Daily  . feeding supplement (NEPRO CARB STEADY)  237 mL Oral Daily  . labetalol  200 mg Oral BID  . lidocaine (PF)      . multivitamin  1 tablet Oral QHS  . senna-docusate  2 tablet Oral BID  . sevelamer carbonate  3,200 mg Oral TID WC  . sodium chloride  3 mL Intravenous Q12H  . sodium chloride  3 mL Intravenous Q12H  . triamcinolone ointment  1 application Topical Daily   Continuous Infusions:   Principal Problem:   Ascites Active Problems:   Atrial fibrillation   ESRD on hemodialysis   Cough syncope   Thrombocytopenia   Leukopenia   Abnormal LFTs   Pedal edema   Abdominal pain, chronic, epigastric    Time spent: 25 minutes.    Marcellus Scott, MD, FACP, FHM. [facility] Pager [phone]  If 7PM-7AM, please contact night-coverage [email or web address] Password [email or web address] 09/02/2014, 2:12 PM    LOS: 3 days

## 2014-09-02 NOTE — Progress Notes (Signed)
Pt c/o of a cough, states cough worsening after abuterol treatment. Requesting med for cough. Paged MD, awaiting call back. Will continue to monitor.

## 2014-09-02 NOTE — Progress Notes (Signed)
Patient ID: Trevor [name], male   DOB: October 20, 1950, 63 y.o.   MRN: [id number]  [facility] Progress Note   Assessment/ Plan:   1. Anasarca/ascites: Seen by gastroenterology and right hepatic lobe lesion is noted to be chronic and felt to be a hemangioma. He has had chronic abdominal pain since his hernia repair but without any postoperative complications. Question as to whether his pain was from ascites/intra-abdominal pressure. Plans in place for transjugular hepatic biopsy by interventional radiology. 2. ESRD: Usually on a Monday/Wednesday/Friday schedule and will order for hemodialysis tomorrow, without any acute needs at this time   3. Anemia:Hemoglobin currently at goal, off ESA 4. CKD-MBD:Continue Renvela, will recheck phosphorus level today 5. Nutrition: Hypoalbuminemia noted, continues nutritional supplementation. Possible indicator of hepatic dysfunction 6. Hypertension:Blood pressures acceptable on monotherapy with labetalol   Subjective:   Reports to be feeling somewhat better-still with cough but feels that "generalized swelling" is down    Objective:   BP 135/93 mmHg  Pulse 97  Temp(Src) 98.5 F (36.9 C) (Oral)  Resp 20  Ht 6\' 1"  (1.854 m)  Wt 98.4 kg (216 lb 14.9 oz)  BMI 28.63 kg/m2  SpO2 98%  Physical Exam: [id number] sitting on the side of his bed-family at bedside CVS: Pulse regular in rate and rhythm, S1 and S2 normal. Obviously distended jugular veins bilaterally  Resp:Clear to auscultation-no distinct rales or rhonchi Abd: Soft, distended, nontender  Ext:No lower extremity edema appreciated Labs: BMET  Recent Labs Lab 08/30/14 0402 08/31/14 0620 09/01/14 0555  NA 135 134* 137  K 4.7 4.2 3.8  CL 97 99 100  CO2 25 22 25   GLUCOSE 84 78 109*  BUN 46* 40* 31*  CREATININE 6.65* 6.09* 5.37*  CALCIUM 8.6 8.3* 8.5   CBC  Recent Labs Lab 08/30/14 0402 08/31/14 0620 09/01/14 0555  WBC 2.4* 3.0* 2.8*  NEUTROABS 1.5*  --   --   HGB  13.2 13.5 13.0  HCT 38.0* 38.1* 37.1*  MCV 82.6 80.2 82.4  PLT 74* 72* 79*   Medications:    . antiseptic oral rinse  7 mL Mouth Rinse BID  . aspirin EC  81 mg Oral Daily  . feeding supplement (NEPRO CARB STEADY)  237 mL Oral Daily  . labetalol  200 mg Oral BID  . multivitamin  1 tablet Oral QHS  . senna-docusate  2 tablet Oral BID  . sevelamer carbonate  3,200 mg Oral TID WC  . sodium chloride  3 mL Intravenous Q12H  . sodium chloride  3 mL Intravenous Q12H  . triamcinolone ointment  1 application Topical Daily   Zetta Bills, MD 09/02/2014, 10:45 AM

## 2014-09-03 LAB — RENAL FUNCTION PANEL
ALBUMIN: 2.6 g/dL — AB (ref 3.5–5.2)
Anion gap: 17 — ABNORMAL HIGH (ref 5–15)
BUN: 58 mg/dL — AB (ref 6–23)
CALCIUM: 8.5 mg/dL (ref 8.4–10.5)
CHLORIDE: 97 meq/L (ref 96–112)
CO2: 20 mmol/L (ref 19–32)
CREATININE: 8.08 mg/dL — AB (ref 0.50–1.35)
GFR calc Af Amer: 7 mL/min — ABNORMAL LOW (ref >90)
GFR calc non Af Amer: 6 mL/min — ABNORMAL LOW (ref >90)
Glucose, Bld: 89 mg/dL (ref 70–99)
Phosphorus: 6.2 mg/dL — ABNORMAL HIGH (ref 2.3–4.6)
Potassium: 5.2 mmol/L — ABNORMAL HIGH (ref 3.5–5.1)
Sodium: 134 mmol/L — ABNORMAL LOW (ref 135–145)

## 2014-09-03 LAB — CBC
HEMATOCRIT: 36.4 % — AB (ref 39.0–52.0)
Hemoglobin: 12.8 g/dL — ABNORMAL LOW (ref 13.0–17.0)
MCH: 28 pg (ref 26.0–34.0)
MCHC: 35.2 g/dL (ref 30.0–36.0)
MCV: 79.6 fL (ref 78.0–100.0)
Platelets: 84 10*3/uL — ABNORMAL LOW (ref 150–400)
RBC: 4.57 MIL/uL (ref 4.22–5.81)
RDW: 18 % — AB (ref 11.5–15.5)
WBC: 2.3 10*3/uL — AB (ref 4.0–10.5)

## 2014-09-03 MED ORDER — SODIUM CHLORIDE 0.9 % IV SOLN: 100.0000 mL | INTRAVENOUS | Status: DC | PRN

## 2014-09-03 MED ORDER — LIDOCAINE HCL (PF) 1 % IJ SOLN: 5.0000 mL | INTRAMUSCULAR | Status: DC | PRN

## 2014-09-03 MED ORDER — HEPARIN SODIUM (PORCINE) 1000 UNIT/ML DIALYSIS: 1000.0000 [IU] | INTRAMUSCULAR | Status: DC | PRN

## 2014-09-03 MED ORDER — NEPRO/CARBSTEADY PO LIQD: 237.0000 mL | ORAL | Status: DC | PRN

## 2014-09-03 MED ORDER — ALTEPLASE 2 MG IJ SOLR
2.0000 mg | Freq: Once | INTRAMUSCULAR | Status: AC | PRN
  Filled 2014-09-03: qty 2

## 2014-09-03 MED ORDER — LIDOCAINE-PRILOCAINE 2.5-2.5 % EX CREA: 1.0000 "application " | TOPICAL_CREAM | CUTANEOUS | Status: DC | PRN

## 2014-09-03 MED ORDER — PENTAFLUOROPROP-TETRAFLUOROETH EX AERO: 1.0000 "application " | INHALATION_SPRAY | CUTANEOUS | Status: DC | PRN

## 2014-09-03 NOTE — Procedures (Signed)
Patient seen on Hemodialysis. QB 400, UF goal 4l Treatment adjusted as needed.  Zetta Bills MD [facility]. Office # [phone] Pager # [phone] 9:35 AM

## 2014-09-03 NOTE — Progress Notes (Signed)
MD notified due to acute change in patient status. Vital signs and labs are listed below.  MD notified(1st page) Time of 1st page:  2222 Responding MD:  Dr. Camila Li Time MD responded: 2240 MD response: MD aware, Place pt on 2L of oxygen and continue to monitor.  Vital Signs Filed Vitals:   09/03/14 1200 09/03/14 1211 09/03/14 1418 09/03/14 2126  BP: 140/100 144/105 125/94 101/71  Pulse: 100 105 95 78  Temp:  97.8 F (36.6 C) 98.4 F (36.9 C) 99.6 F (37.6 C)  TempSrc:  Oral Oral Oral  Resp: 32 25 20 27   Height:      Weight:  94.7 kg (208 lb 12.4 oz)    SpO2:  97% 92% 98%     Lab Results WBC  Date/Time Value Ref Range Status  09/03/2014 08:05 AM 2.3* 4.0 - 10.5 K/uL Final  09/01/2014 05:55 AM 2.8* 4.0 - 10.5 K/uL Final  08/31/2014 06:20 AM 3.0* 4.0 - 10.5 K/uL Final  10/25/2009 09:33 AM 2.7* 4.0 - 10.3 10e3/uL Final  04/24/2009 11:37 AM 3.0* 4.0 - 10.3 10e3/uL Final  01/25/2009 12:59 PM 2.6* 4.0 - 10.3 10e3/uL Final   NEUT%  Date/Time Value Ref Range Status  10/25/2009 09:33 AM 51.4 39.0 - 75.0 % Final  04/24/2009 11:37 AM 53.5 39.0 - 75.0 % Final  01/25/2009 12:59 PM 51.3 39.0 - 75.0 % Final   NEUTROPHILS RELATIVE %  Date/Time Value Ref Range Status  08/30/2014 04:02 AM 63 43 - 77 % Final  03/26/2014 11:50 AM 72 43 - 77 % Final  09/01/2013 02:10 AM 52 43 - 77 % Final   No results found for: PCO2ART LACTIC ACID, VENOUS  Date/Time Value Ref Range Status  08/30/2014 04:12 AM 1.93 0.5 - 2.2 mmol/L Final  03/26/2014 12:02 PM 1.26 0.5 - 2.2 mmol/L Final  09/01/2013 02:30 AM 1.1 0.5 - 2.2 mmol/L Final   PCO2, VEN  Date/Time Value Ref Range Status  02/11/2007 07:54 AM 28.5*  Final     Sandrea Hughs, [place], RN 09/03/2014, 10:23 PM

## 2014-09-03 NOTE — Progress Notes (Addendum)
Progress Note   Subjective  *Much more comfortable after second paracentesis.**   Objective  Vital signs in last 24 hours: Temp:  [97.8 F (36.6 C)-99.7 F (37.6 C)] 97.8 F (36.6 C) (12/27 0750) Pulse Rate:  [54-102] 86 (12/27 0930) Resp:  [18-42] 42 (12/27 0930) BP: (99-133)/(67-93) 125/93 mmHg (12/27 0930) SpO2:  [96 %-98 %] 96 % (12/27 0750) Weight:  [212 lb 4.8 oz (96.299 kg)-215 lb 9.8 oz (97.8 kg)] 215 lb 9.8 oz (97.8 kg) (12/27 0750) Last BM Date: 09/02/14 General:   Alert,  Well-developed,   in NAD Heart:  Regular rate and rhythm; no murmurs.  Positive jugular venous distention Abdomen:  Soft, nontender but distended. Normal bowel sounds, without guarding, and without rebound.   Extremities:  Without edema. Neurologic:  Alert and  oriented x4;  grossly normal neurologically. Psych:  Alert and cooperative. Normal mood and affect.  Intake/Output from previous day: 12/26 0701 - 12/27 0700 In: 1140 [P.O.:1137; I.V.:3] Out: -  Intake/Output this shift:    Lab Results:  Recent Labs  09/01/14 0555 09/03/14 0805  WBC 2.8* 2.3*  HGB 13.0 12.8*  HCT 37.1* 36.4*  PLT 79* 84*   BMET  Recent Labs  09/01/14 0555 09/03/14 0805  NA 137 134*  K 3.8 5.2*  CL 100 97  CO2 25 20  GLUCOSE 109* 89  BUN 31* 58*  CREATININE 5.37* 8.08*  CALCIUM 8.5 8.5   LFT  Recent Labs  09/01/14 0555 09/03/14 0805  PROT 6.9  --   ALBUMIN 2.7* 2.6*  AST 41*  --   ALT 28  --   ALKPHOS 225*  --   BILITOT 2.3*  --    PT/INR No results for input(s): LABPROT, INR in the last 72 hours. Hepatitis Panel No results for input(s): HEPBSAG, HCVAB, HEPAIGM, HEPBIGM in the last 72 hours.  Studies/Results: [place] Paracentesis  09/02/2014   INDICATION: Recurrent ascites, shortness of breath, request for paracentesis.  EXAM: ULTRASOUND-GUIDED PARACENTESIS  COMPARISON:  08/30/14.  MEDICATIONS: None.  COMPLICATIONS: None immediate  TECHNIQUE: Informed written consent was obtained from  the patient after a discussion of the risks, benefits and alternatives to treatment. A timeout was performed prior to the initiation of the procedure.  Initial ultrasound scanning demonstrates a large amount of ascites within the left lower abdominal quadrant. The left lower abdomen was prepped and draped in the usual sterile fashion. 1% lidocaine was used for local anesthesia. An ultrasound image was saved for documentation purposed. An 8 Fr Safe-T-Centesis catheter was introduced. The paracentesis was performed. The catheter was removed and a dressing was applied. The patient tolerated the procedure well without immediate post procedural complication.  FINDINGS: A total of approximately 1.7 liters of serous/amber colored fluid was removed. Samples were sent to the laboratory as requested by the clinical team.  IMPRESSION: Successful ultrasound-guided paracentesis yielding 1.7 liters of peritoneal fluid.  Read By:  Pattricia BossKoreen Morgan PA-C   Electronically Signed   By: Ruel Favorsrevor  Shick M.D.   On: 09/02/2014 13:39      Assessment & Plan  1.  *Abdominal pain.  At this point it appears that it is related to ascites.  It may be putting pressure on his recent surgical wound.  There is no evidence for SBP 2.  Hepatosplenomegaly, thrombocytopenia, ascites raises the question of a cirrhosis  prior serologic workup negative (ANA and AMA not done) (  Recommendations #1 liver biopsy-this will have to be done transjugular #2 check ANA,  AMA #3 no need for HIDA scan . Principal Problem:   Ascites Active Problems:   Atrial fibrillation   ESRD on hemodialysis   Cough syncope   Thrombocytopenia   Leukopenia   Abnormal LFTs   Pedal edema   Abdominal pain, chronic, epigastric   Chronic atrial fibrillation     LOS: 4 days   Melvia Heaps  09/03/2014, 10:02 AM

## 2014-09-03 NOTE — Progress Notes (Signed)
Patient ID: Trevor [name], male   DOB: Jan 25, 1951, 63 y.o.   MRN: [id number]  [facility] Progress Note   Assessment/ Plan:   1. Anasarca/ascites: Seen by gastroenterology and right hepatic lobe lesion is noted to be chronic and felt to be a hemangioma. He has had chronic abdominal pain since his hernia repair but without any postoperative complications. It is pssible that his pain was from ascites/intra-abdominal pressure. HIDA scan tomorrow and possible consideration of a transjugular liver biopsy noted thereafter. 2. ESRD: Usually on a Monday/Wednesday/Friday schedule and for HD today and will order for hemodialysis again tomorrow. UF not very effective in alleviating ascites but would decrease rate of re-accumulation  3. Anemia:Hemoglobin currently at goal, off ESA 4. CKD-MBD:Continue Renvela, elevated phosphorus levels noted-add calcium acetate 667 mg 3 times a day before meals 5. Nutrition: Hypoalbuminemia noted, continues nutritional supplementation. Possible indicator of hepatic dysfunction 6. Hypertension:Blood pressures acceptable on monotherapy with labetalol   Subjective:   Reports continued bouts of coughing- no abdominal pain   Objective:   BP 105/80 mmHg  Pulse 82  Temp(Src) 97.8 F (36.6 C) (Oral)  Resp 26  Ht 6\' 1"  (1.854 m)  Wt 97.8 kg (215 lb 9.8 oz)  BMI 28.45 kg/m2  SpO2 96%  Physical Exam: Gen: Resting comfortably on dialysis CVS: Pulse regular in rate and rhythm, S1 and S2 normal, distended jugular veins Resp: Clear to auscultation anteriorly-no rales Abd: Soft, distended, bowel sounds normal Ext:2+ LE edema  Labs: BMET  Recent Labs Lab 08/30/14 0402 08/31/14 0620 09/01/14 0555 09/03/14 0805  NA 135 134* 137 134*  K 4.7 4.2 3.8 5.2*  CL 97 99 100 97  CO2 25 22 25 20   GLUCOSE 84 78 109* 89  BUN 46* 40* 31* 58*  CREATININE 6.65* 6.09* 5.37* 8.08*  CALCIUM 8.6 8.3* 8.5 8.5  PHOS  --   --   --  6.2*   CBC  Recent Labs Lab  08/30/14 0402 08/31/14 0620 09/01/14 0555 09/03/14 0805  WBC 2.4* 3.0* 2.8* 2.3*  NEUTROABS 1.5*  --   --   --   HGB 13.2 13.5 13.0 12.8*  HCT 38.0* 38.1* 37.1* 36.4*  MCV 82.6 80.2 82.4 79.6  PLT 74* 72* 79* 84*   Medications:    . antiseptic oral rinse  7 mL Mouth Rinse BID  . aspirin EC  81 mg Oral Daily  . feeding supplement (NEPRO CARB STEADY)  237 mL Oral Daily  . labetalol  200 mg Oral BID  . multivitamin  1 tablet Oral QHS  . senna-docusate  2 tablet Oral BID  . sevelamer carbonate  3,200 mg Oral TID WC  . sodium chloride  3 mL Intravenous Q12H  . sodium chloride  3 mL Intravenous Q12H  . triamcinolone ointment  1 application Topical Daily   Zetta Bills, MD 09/03/2014, 8:57 AM

## 2014-09-03 NOTE — Progress Notes (Signed)
Pt refused tap H2O enema. Had BM yesterday. Barbera Setters RN

## 2014-09-03 NOTE — Progress Notes (Signed)
PROGRESS NOTE    Trevor SchwabKenny A Martin [name]:[id number]:[id number] DOB: 08/27/1951 DOA: 08/30/2014 PCP: Trevor AlimentSANDERS,Trevor N, MD  HPI/Brief narrative 63 year old male patient with history of ESRD on HD MWF, chronic systolic CHF, A. 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 He had vigorous coughing spells and passed out. He saw his cardiologist and was advised to come to the hospital. He underwent diagnostic and therapeutic paracentesis by IR. Nephrology consulting for dialysis needs. GI consulted.   Assessment/Plan:  1. Massive ascites:? Etiology. ? 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 Status post 1.2 L paracentesis by IR on 12/23 & 1.7 L on 12/26. Ascitic fluid not in keeping with SBP. Discussed with nephrology-could be secondary to uremic peritonitis and will not be able to reduce a whole lot across dialysis. GI input appreciated-likely has cirrhosis with portal hypertension accounting for ascites and splenomegaly. Discussed with Dr. Arlyce DiceKaplan: Recommend transjugular liver biopsy to evaluate for cirrhosis, no need for HIDA scan and follow up ANA & Mitochondrial Ab's. 2. Chronic abdominal pain/hepatosplenomegaly: Unclear etiology. States started after hernia repair but no postoperative complications.? Related to ascites-improved after tap. GI follow-up appreciated. 3. ESRD on MWF HD: Management per nephrology.? Increased volume removal across dialysis.  4. Liver lesion seen on CT but not ultrasound:? MRI of liver. GI input appreciated. 5. Syncope: Possibly cough syncope. Initial troponin minimally elevated which could be secondary to renal failure but subsequent troponins have been negative. No clinical pneumonia. 6. History of chronic A. fib: Rate controlled in the  90s. Warfarin anticoagulation discontinued secondary to spontaneous back hematoma and patient has declined anticoagulation since. 7. History of essential hypertension: Mildly uncontrolled. Monitor 8. 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 History of chronic systolic CHF: Volume overloaded-management across dialysis. LVEF by echo on 05/09/14 showed EF of 35-40 percent with diffuse hypokinesis. Will repeat 2-D echo. 10. Leukopenia and thrombocytopenia:? Secondary to liver disease. Follow CBCs. Stable.? Hypersplenism. 11. Chronic cough: States that he has had it for a year and etiology is not clear.   Code Status: Full  Family Communication: None at bedside. Disposition Plan: Home when medically stable   Consultants:  Nephrology  Gastroenterology  Procedures:  Paracentesis 1.2 L by IR on 12/23 and 1.7 L on 12/26  Hemodialysis  Antibiotics:  None   Subjective: Seen this morning at hemodialysis. Abdominal discomfort has improved post paracentesis yesterday.  Objective: Filed Vitals:   09/03/14 1100 09/03/14 1130 09/03/14 1200 09/03/14 1211  BP: 131/91 161/98 140/100 144/105  Pulse: 94 81 100 105  Temp:    97.8 F (36.6 C)  TempSrc:    Oral  Resp: 38 29 32 25  Height:      Weight:    94.7 kg (208 lb 12.4 oz)  SpO2:    97%    Intake/Output Summary (Last 24 hours) at 09/03/14 1322 Last data filed at 09/03/14 1230  Gross per 24 hour  Intake    683 ml  Output   2993 ml  Net  -2310 ml   Filed Weights   09/03/14 0418 09/03/14 0750 09/03/14 1211  Weight: 96.299 kg (212 lb 4.8 oz) 97.8 kg (215 lb 9.8 oz) 94.7 kg (208 lb 12.4 oz)     Exam:  General exam: Moderately built and nourished middle-aged male lying comfortably in bed undergoing hemodialysis.  Respiratory system: otherwise clear to auscultation. No increased work of breathing. Cardiovascular system: S1 & S2 heard, irregularly irregular. No JVD, murmurs, gallops, clicks. Trace bilateral ankle edema.    Gastrointestinal system: Abdomen is moderately distended(decreased), soft and nontender. Ascites + +. Normal bowel sounds heard. Central nervous system: Alert and oriented. No focal neurological deficits. Extremities: Symmetric 5 x 5 power.   Data Reviewed: Basic Metabolic Panel:  Recent Labs Lab 08/30/14 0402 08/31/14 0620 09/01/14 0555 09/02/14 0552 09/03/14 0805  NA 135 134* 137  --  134*  K 4.7 4.2 3.8  --  5.2*  CL 97 99 100  --  97  CO2 25 22 25   --  20  GLUCOSE 84 78 109*  --  89  BUN 46* 40* 31*  --  58*  CREATININE 6.65* 6.09* 5.37*  --  8.08*  CALCIUM 8.6 8.3* 8.5  --  8.5  MG  --   --   --  2.4  --   PHOS  --   --   --   --  6.2*   Liver Function Tests:  Recent Labs Lab 08/30/14 0402 08/31/14 0620 09/01/14 0555 09/03/14 0805  AST 42* 37 41*  --   ALT 32 28 28  --   ALKPHOS 228* 225* 225*  --   BILITOT 2.0* 2.0* 2.3*  --   PROT 7.1 6.7 6.9  --   ALBUMIN 2.8* 2.6* 2.7* 2.6*   No results for input(s): LIPASE, AMYLASE in the last 168 hours. No results for input(s): AMMONIA in the last 168 hours. CBC:  Recent Labs Lab 08/30/14 0402 08/31/14 0620 09/01/14 0555 09/03/14 0805  WBC 2.4* 3.0* 2.8* 2.3*  NEUTROABS 1.5*  --   --   --   HGB 13.2 13.5 13.0 12.8*  HCT 38.0* 38.1* 37.1* 36.4*  MCV 82.6 80.2 82.4 79.6  PLT 74* 72* 79* 84*   Cardiac Enzymes:  Recent Labs Lab 08/30/14 1034 08/30/14 1546 08/30/14 2146  TROPONINI 0.04* <0.03 0.03   BNP (last 3 results)  Recent Labs  08/29/14 1421  PROBNP 1830.0*   CBG: No results for input(s): GLUCAP in the last 168 hours.  Recent Results (from the past 240 hour(s))  Body fluid culture     Status: None   Collection Time: 08/30/14  1:27 PM  Result Value Ref Range Status   Specimen Description FLUID ABDOMEN ASCITIC  Final   Special Requests NONE  Final   Gram Stain   Final    NO WBC SEEN NO ORGANISMS SEEN Performed at [facility]    Culture   Final    NO GROWTH 3 DAYS Performed  at [facility]    Report Status 09/02/2014 FINAL  Final  AFB culture with smear     Status: None (Preliminary result)   Collection Time: 08/30/14  1:27 PM  Result Value Ref Range Status   Specimen Description FLUID ABDOMEN ASCITIC  Final   Special Requests NONE  Final   Acid Fast Smear   Final    NO ACID FAST BACILLI SEEN Performed at [facility]    Culture   Final    CULTURE WILL BE EXAMINED FOR 6 WEEKS BEFORE ISSUING A FINAL REPORT Performed at [facility]    Report Status PENDING  Incomplete  Body fluid culture     Status: None (Preliminary result)   Collection Time: 09/02/14  1:44 PM  Result Value Ref Range Status   Specimen  Description PERITONEAL FLUID  Final   Special Requests NONE  Final   Gram Stain   Final    RARE WBC PRESENT,BOTH PMN AND MONONUCLEAR NO ORGANISMS SEEN Performed at [facility]    Culture   Final    NO GROWTH 1 DAY Performed at [facility]    Report Status PENDING  Incomplete          Studies: US Paracentesis  09/02/2014   INDICATION: Recurrent ascites, shortness of breath, request for paracentesis.  EXAM: ULTRASOUND-GUIDED PARACENTESIS  COMPARISON:  08/30/14.  MEDICATIONS: None.  COMPLICATIONS: None immediate  TECHNIQUE: Informed written consent was obtained from the patient after a discussion of the risks, benefits and alternatives to treatment. A timeout was performed prior to the initiation of the procedure.  Initial ultrasound scanning demonstrates a large amount of ascites within the left lower abdominal quadrant. The left lower abdomen was prepped and draped in the usual sterile fashion. 1% lidocaine was used for local anesthesia. An ultrasound image was saved for documentation purposed. An 8 Fr Safe-T-Centesis catheter was introduced. The paracentesis was performed. The catheter was removed and a dressing was applied. The patient tolerated the procedure well without immediate post procedural  complication.  FINDINGS: A total of approximately 1.7 liters of serous/amber colored fluid was removed. Samples were sent to the laboratory as requested by the clinical team.  IMPRESSION: Successful ultrasound-guided paracentesis yielding 1.7 liters of peritoneal fluid.  Read By:  Pattricia Boss PA-C   Electronically Signed   By: Ruel Favors M.D.   On: 09/02/2014 13:39        Scheduled Meds: . antiseptic oral rinse  7 mL Mouth Rinse BID  . aspirin EC  81 mg Oral Daily  . feeding supplement (NEPRO CARB STEADY)  237 mL Oral Daily  . labetalol  200 mg Oral BID  . multivitamin  1 tablet Oral QHS  . senna-docusate  2 tablet Oral BID  . sevelamer carbonate  3,200 mg Oral TID WC  . sodium chloride  3 mL Intravenous Q12H  . sodium chloride  3 mL Intravenous Q12H  . triamcinolone ointment  1 application Topical Daily   Continuous Infusions:   Principal Problem:   Ascites Active Problems:   Atrial fibrillation   ESRD on hemodialysis   Cough syncope   Thrombocytopenia   Leukopenia   Abnormal LFTs   Pedal edema   Abdominal pain, chronic, epigastric   Chronic atrial fibrillation    Time spent: 25 minutes.    Marcellus Scott, MD, FACP, FHM. [facility] Pager [phone]  If 7PM-7AM, please contact night-coverage [email or web address] Password [email or web address] 09/03/2014, 1:22 PM    LOS: 4 days

## 2014-09-04 DIAGNOSIS — K7469 Other cirrhosis of liver: Secondary | ICD-10-CM

## 2014-09-04 DIAGNOSIS — I509 Heart failure, unspecified: Secondary | ICD-10-CM

## 2014-09-04 DIAGNOSIS — I369 Nonrheumatic tricuspid valve disorder, unspecified: Secondary | ICD-10-CM

## 2014-09-04 DIAGNOSIS — N186 End stage renal disease: Secondary | ICD-10-CM

## 2014-09-04 DIAGNOSIS — I482 Chronic atrial fibrillation: Secondary | ICD-10-CM

## 2014-09-04 DIAGNOSIS — I50810 Right heart failure, unspecified: Secondary | ICD-10-CM | POA: Diagnosis present

## 2014-09-04 DIAGNOSIS — Z992 Dependence on renal dialysis: Secondary | ICD-10-CM

## 2014-09-04 LAB — MITOCHONDRIAL ANTIBODIES: Mitochondrial M2 Ab, IgG: 0.54 (ref <0.91)

## 2014-09-04 LAB — RENAL FUNCTION PANEL
Albumin: 2.6 g/dL — ABNORMAL LOW (ref 3.5–5.2)
Anion gap: 13 (ref 5–15)
BUN: 49 mg/dL — AB (ref 6–23)
CO2: 24 mmol/L (ref 19–32)
Calcium: 8.6 mg/dL (ref 8.4–10.5)
Chloride: 97 mEq/L (ref 96–112)
Creatinine, Ser: 6.94 mg/dL — ABNORMAL HIGH (ref 0.50–1.35)
GFR calc non Af Amer: 8 mL/min — ABNORMAL LOW (ref >90)
GFR, EST AFRICAN AMERICAN: 9 mL/min — AB (ref >90)
Glucose, Bld: 83 mg/dL (ref 70–99)
Phosphorus: 5.2 mg/dL — ABNORMAL HIGH (ref 2.3–4.6)
Potassium: 4.9 mmol/L (ref 3.5–5.1)
SODIUM: 134 mmol/L — AB (ref 135–145)

## 2014-09-04 LAB — CBC
HCT: 37.6 % — ABNORMAL LOW (ref 39.0–52.0)
Hemoglobin: 13 g/dL (ref 13.0–17.0)
MCH: 28.3 pg (ref 26.0–34.0)
MCHC: 34.6 g/dL (ref 30.0–36.0)
MCV: 81.9 fL (ref 78.0–100.0)
Platelets: 82 10*3/uL — ABNORMAL LOW (ref 150–400)
RBC: 4.59 MIL/uL (ref 4.22–5.81)
RDW: 18 % — ABNORMAL HIGH (ref 11.5–15.5)
WBC: 2.6 10*3/uL — ABNORMAL LOW (ref 4.0–10.5)

## 2014-09-04 LAB — TOTAL BILIRUBIN, BODY FLUID: Total bilirubin, fluid: 1 mg/dL

## 2014-09-04 LAB — ANA: ANA: NEGATIVE

## 2014-09-04 MED ORDER — SEVELAMER CARBONATE 800 MG PO TABS
3200.0000 mg | ORAL_TABLET | Freq: Three times a day (TID) | ORAL | Status: DC
  Administered 2014-09-04 – 2014-09-12 (×19): 3200 mg via ORAL
  Filled 2014-09-04 (×2): qty 4

## 2014-09-04 MED ORDER — NEPRO/CARBSTEADY PO LIQD
237.0000 mL | Freq: Every day | ORAL | Status: DC
  Administered 2014-09-04 – 2014-09-12 (×6): 237 mL via ORAL

## 2014-09-04 MED ORDER — SEVELAMER CARBONATE 800 MG PO TABS: 1600.0000 mg | ORAL_TABLET | ORAL | Status: DC | PRN

## 2014-09-04 NOTE — Consult Note (Signed)
Reason for consult: transjugular liver biopsy   Referring Physician(s): Dr. Leone Payor  History of Present Illness: Trevor [name] is a 63 y.o. 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 Request now received from GI for TJ liver biopsy/hepatic vein wedge pressure measurements.  Past Medical History  Diagnosis Date  . Anemia   . AVF (arteriovenous fistula)     Left  . Secondary hyperparathyroidism   . Hypovitaminosis D   . Hypertensive urgency     H/o  . CHF (congestive heart failure)   . Exertional shortness of breath     "related to infection in my lungs right now" (05/03/2013)  . History of gout     "before I started doing the dialysis" (05/03/2013)  . Sleep apnea   . GERD (gastroesophageal reflux disease)   . Syncope     felt secondary to residual anesthesia the day before - 2D echo unremarkable  . Pulmonary embolism     with right DVT secondary to recent surgery  . Atrial fibrillation     not on coumadin due to large spontaneous hematoma on back and anemia  . Myocardial infarction 90's  . Coronary artery disease   . Dysrhythmia     afib  . Peripheral vascular disease     dvt leg 12/14  . Hypertension   . ESRD (end stage renal disease) on dialysis     [facility] mon/wed/fri    Past Surgical History  Procedure Laterality Date  . Av fistula placement Left     Dr. Charlean Sanfilippo; "I've had 2 on the left' (05/03/2013)  . Av fistula placement Right ~ 2011  . Knee arthroscopy Left   . Avgg removal Right 05/04/2013    Procedure: REMOVAL OF ARTERIOVENOUS Fistula Right Arm;  Surgeon: [name], MD;  Location: [facility] OR;  Service: Vascular;  Laterality: Right;  . Insertion of dialysis catheter Right 05/04/2013    Procedure: INSERTION OF DIALYSIS CATHETER;  Surgeon: [name], MD;  Location: [facility] OR;  Service: Vascular;  Laterality: Right;  . Colonoscopy     Hx: of  . Bascilic vein transposition Left 06/27/2013    Procedure: BASCILIC VEIN TRANSPOSITION;  Surgeon: [name], MD;  Location: [facility] OR;  Service: Vascular;  Laterality: Left;  . Cardioversion N/A 08/29/2013    Procedure: CARDIOVERSION;  Surgeon: [name], MD;  Location: [facility] ENDOSCOPY;  Service: Cardiovascular;  Laterality: N/A;  . Removal of a dialysis catheter  2/15  . Hernia repair  03/21/14    Umbilical hernia-Dr. Derrell Lolling  . Umbilical hernia repair N/A 03/21/2014    Procedure: LAPAROSCOPIC UMBILICAL HERNIA REPAIR WITH MESH;  Surgeon: Axel Filler, MD;  Location: [facility] OR;  Service: General;  Laterality: N/A;  . Insertion of mesh N/A 03/21/2014    Procedure: INSERTION OF MESH;  Surgeon: Axel Filler, MD;  Location: [facility] OR;  Service: General;  Laterality: N/A;  . Venogram Left 05/26/2013    Procedure: VENOGRAM;  Surgeon: Fransisco Hertz, MD;  Location: [facility] CATH LAB;  Service: Cardiovascular;  Laterality: Left;    Allergies: Pork-derived products  Medications: Prior to Admission medications   Medication Sig Start Date End Date Taking? Authorizing Provider  amLODipine (NORVASC) 10 MG tablet Take 10 mg by mouth daily.     Yes Historical Provider, MD  aspirin EC 81 MG tablet Take 1 tablet (81 mg total) by mouth daily. 09/06/13  Yes Penny Pia, MD  labetalol (NORMODYNE) 200 MG tablet Take 200 mg by mouth 2 (two) times daily.   Yes Historical Provider, MD  multivitamin (RENA-VIT) TABS tablet Take 1 tablet by mouth daily. 08/01/14  Yes Historical Provider, MD  Nutritional Supplements (NEPRO) LIQD Take by mouth daily.   Yes Historical Provider, MD  sevelamer carbonate (RENVELA) 800 MG tablet Take 2,400-3,200 mg by mouth See admin instructions. 3200mg  with each meal, 2400-3200mg  with each snack   Yes Historical Provider, MD  vitamin C (ASCORBIC ACID) 500 MG tablet Take 500-1,500 mg by mouth as needed.    Yes Historical Provider, MD  triamcinolone ointment (KENALOG) 0.5 % Apply 0.5  application topically daily. 08/18/14   Historical Provider, MD    Family History  Problem Relation Age of Onset  . Hypertension Father   . Kidney disease Father   . Allergies Father   . Deep vein thrombosis Sister   . Pulmonary embolism Sister   . Diabetes Paternal Grandmother     History   Social History  . Marital Status: Single    Spouse Name: N/A    Number of Children: 4  . Years of Education: N/A   Occupational History  . Disabled    Social History Main Topics  . Smoking status: Former Smoker -- 0.25 packs/day for .5 years    Types: Cigarettes    Quit date: 09/08/1972  . Smokeless tobacco: Never Used  . Alcohol Use: Yes     Comment: 05/03/2013 "haven't had a glass of wine in ~ 3 months or so; sometimes will have one w/dinner"  . Drug Use: No  . Sexual Activity: Yes   Other Topics Concern  . None   Social History Narrative   Former smoker- quit over 30 yrs ago   Patient is on disability        Review of Systems  Constitutional:       Occ sweats, mildy elevated temp (99.4)  Respiratory: Positive for cough and shortness of breath.   Cardiovascular: Positive for leg swelling. Negative for chest pain.  Gastrointestinal: Positive for abdominal pain and abdominal distention. Negative for nausea, vomiting and blood in stool.  Musculoskeletal: Negative for back pain.  Skin:       itching  Neurological: Negative for headaches.    Vital Signs: BP 121/87 mmHg  Pulse 100  Temp(Src) 99.4 F (37.4 C) (Oral)  Resp 24  Ht 6\' 1"  (1.854 m)  Wt 209 lb 1.6 oz (94.847 kg)  BMI 27.59 kg/m2  SpO2 95%  Physical Exam pt awake/alert; chest- sl dim BS bases; heart- irreg, sl tachy; abd- sl dist/tense, mild diffuse tenderness,+BS; ext- FROM, 2+ edema ; LUE fistula with good thrill/bruit  Imaging: Ct Abdomen Pelvis Wo Contrast  08/30/2014   CLINICAL DATA:  Worsening abdominal distention and generalized abdominal pain, chronic in nature. Initial encounter.  EXAM: CT  ABDOMEN AND PELVIS WITHOUT CONTRAST  TECHNIQUE: Multidetector CT imaging of the abdomen and pelvis was performed following the standard protocol without IV contrast.  COMPARISON:  CT of the abdomen and pelvis from 03/26/2014  FINDINGS: Mild patchy bilateral atelectasis is noted. Diffuse coronary artery calcifications are seen. Scattered calcified enlarged hilar and mediastinal nodes are again seen, measuring up to 3.0 cm in short axis. Cardiomegaly is noted.  There is significantly increased moderate to large volume ascites within the abdomen and pelvis.  Hepatomegaly is again noted. A 1.9 cm focus of decreased attenuation within the right hepatic lobe, with overlying  retraction of the hepatic capsule, appears to have increased in size from the prior study. Malignancy cannot be excluded. Evaluation for additional hepatic masses is limited without contrast. The spleen is enlarged, as on the prior study, measuring 13.8 cm in length.  The pancreas and adrenal glands are unremarkable in appearance.  Severe bilateral renal atrophy is noted, with a few scattered renal cysts. Nonspecific perinephric stranding is noted bilaterally. There is no evidence of hydronephrosis. No renal or ureteral stones are seen.  The small bowel is unremarkable in appearance. The stomach is within normal limits. No acute vascular abnormalities are seen. Scattered calcification is seen along the abdominal aorta and its branches. The vasculature is difficult to fully assess without contrast.  The appendix is not definitely characterized given surrounding ascites. The colon is partially filled with stool and is grossly unremarkable in appearance, though difficult to fully characterize due to surrounding ascites.  The bladder is mildly distended and unremarkable in appearance. The prostate remains normal in size, with scattered calcification. Mildly enlarged bilateral inguinal nodes are seen, measuring up to 1.5 cm; some of this may reflect  underlying soft tissue edema.  Diffuse soft tissue edema is again noted, raising concern for anasarca. This is mildly improved from the prior study.  No acute osseous abnormalities are identified. Diffuse uniform sclerotic change within the visualized osseous structures reflects renal osteodystrophy.  IMPRESSION: 1. Significantly increased moderate to large volume abdominopelvic ascites. 2. 1.9 cm focus of decreased attenuation within the right hepatic lobe, with overlying retraction of the hepatic capsule, appears to have increased in size from the prior study. Malignancy cannot be excluded. Would correlate with LFTs and consider dynamic liver protocol CT for further evaluation. 3. Splenomegaly and hepatomegaly noted. 4. Cardiomegaly noted.  Diffuse coronary artery calcifications seen. 5. Scattered calcified enlarged hilar and mediastinal nodes again noted, measuring up to 3.0 cm in short axis. These were present in 2014, and thought to reflect remote granulomatous disease. 6. Mild patchy bilateral atelectasis noted. 7. Severe bilateral renal atrophy, with a few scattered renal cysts. 8. Scattered calcification along the abdominal aorta and its branches. 9. Mildly enlarged bilateral inguinal nodes, measuring up to 1.5 cm in short axis. This may partially reflect underlying soft tissue edema. 10. Diffuse soft tissue edema, raising concern for anasarca; this is mildly improved from the prior study. 11. Findings of renal osteodystrophy.   Electronically Signed   By: Roanna Raider M.D.   On: 08/30/2014 07:44   Dg Chest 2 View  08/30/2014   CLINICAL DATA:  Acute onset of shortness of breath and abdominal distention. Initial encounter.  EXAM: CHEST  2 VIEW  COMPARISON:  Chest radiograph performed 01/16/2014, and CT of the chest performed 09/01/2013  FINDINGS: The lungs are hypoexpanded. Bibasilar airspace opacities may reflect multifocal pneumonia or pulmonary edema. There is no evidence of pleural effusion or  pneumothorax.  The heart is enlarged.  No acute osseous abnormalities are seen.  IMPRESSION: Lungs hypoexpanded. Bibasilar airspace opacities may reflect multifocal pneumonia or mild pulmonary edema. Cardiomegaly noted.   Electronically Signed   By: Roanna Raider M.D.   On: 08/30/2014 05:41   US Abdomen Complete  08/30/2014   CLINICAL DATA:  Abnormal liver function tests.  Ascites.  EXAM: ULTRASOUND ABDOMEN COMPLETE  COMPARISON:  None.  FINDINGS: Gallbladder: Cholelithiasis. Gallbladder wall thickening measuring 9.1 mm. Negative sonographic Murphy's sign. Hyperechoic foci along the gallbladder wall with ring down artifact as can be seen with adenomyomatosis.  Common bile duct:  Diameter: 5.5 mm  Liver: No focal lesion identified. Increased hepatic parenchymal echogenicity. There is hepatomegaly.  IVC: No abnormality visualized.  Pancreas: Visualized portion unremarkable.  Spleen: The spleen is enlarged measuring 720 mL. No focal splenic lesion.  Right Kidney: Length: 7.1 cm. Echogenicity within normal limits. No mass or hydronephrosis visualized.  Left Kidney: Length: 5.8 cm. There is an anechoic left renal mass with increased through transmission measuring 13 x 17 mm most consistent with a cyst. Echogenicity within normal limits. No hydronephrosis visualized.  Abdominal aorta: The proximal abdominal aorta measures 3 cm in AP diameter.  Other findings: There is moderate abdominal ascites.  IMPRESSION: 1. Cholelithiasis. Gallbladder wall thickening which may be secondary to intrinsic gallbladder wall abnormality suggest cholecystitis, but this appearance can be seen in the setting of hepatocellular disease and ascites. 2. Atrophic kidneys. 3. Splenomegaly. 4. The hypodense mass seen in the right hepatic lobe adjacent to the liver capsule on CT abdomen performed same day is not visualized on the ultrasound. Further evaluation with a CT or MRI of the abdomen is recommended optimized for evaluation of the liver. 5.  Proximal abdominal aorta measures 3 cm in AP diameter. Recommend followup by [place] in 3 years. This recommendation follows ACR consensus guidelines: White Paper of the ACR Incidental Findings Committee II on Vascular Findings. Earlyne Iba Radiol 2013; 16:109-604   Electronically Signed   By: Elige Ko   On: 08/30/2014 17:55   Ct Abdomen Pelvis W Contrast  08/30/2014   CLINICAL DATA:  Abdominal tenderness in distension.  Liver lesion  EXAM: CT ABDOMEN AND PELVIS WITH CONTRAST  TECHNIQUE: Multidetector CT imaging of the abdomen and pelvis was performed using the standard protocol following bolus administration of intravenous contrast.  CONTRAST:  80mL OMNIPAQUE IOHEXOL 300 MG/ML  SOLN  COMPARISON:  CT 08/30/2014 at 6:30 a.m.  FINDINGS: Lower chest: Interlobular septal thickening at the lung bases. No pleural fluid. Heart is enlarged. Pericardial fluid.  Hepatobiliary: There is reflux of contrast into the hepatic veins suggesting right heart dysfunction. Moderate volume of fluid surrounding the margin of the liver similar to prior. Slight decrease in the fluid in the pelvis. There is mild traction of the right hepatic lobe not changed from prior with a small hypodense lesions. Gallbladder is collapsed.  Pancreas: Difficult to evaluate the intra-abdominal organs due to ascites. No gross abnormality pancreas.  Spleen: Spleen is enlarged.  Adrenals/urinary tract: Adrenal is normal. Kidneys are atrophic. Bladder is collapsed. No your ureter abnormality  Stomach/Bowel: Stomach, small bowel, colon are unremarkable. There is progression of the oral contrast from the prior scan into the descending colon rectum.  Vascular/Lymphatic: Abdominal aorta is normal caliber no retroperitoneal periportal lymphadenopathy.  Reproductive: Prostate gland is normal.  Musculoskeletal: There is diffuse gross bone consistent with renal osteodystrophy.  Other: Anasarca  IMPRESSION: 1. Interval decrease in volume of intraperitoneal free fluid  volume paracentesis. Moderate volume of ascites remains. 2. No other significant interval change in short interval follow-up from 6:30 a.m. same day 3. No evidence of bowel obstruction. 4. Splenomegaly 5. Evidence of right heart dysfunction with reflux of contrast into hepatic veins. 6. Interlobular septal thickening at the lung bases consistent with interstitial edema.   Electronically Signed   By: Genevive Bi M.D.   On: 08/30/2014 14:42   US Paracentesis  09/02/2014   INDICATION: Recurrent ascites, shortness of breath, request for paracentesis.  EXAM: ULTRASOUND-GUIDED PARACENTESIS  COMPARISON:  08/30/14.  MEDICATIONS: None.  COMPLICATIONS: None immediate  TECHNIQUE: Informed written consent was obtained from the patient after a discussion of the risks, benefits and alternatives to treatment. A timeout was performed prior to the initiation of the procedure.  Initial ultrasound scanning demonstrates a large amount of ascites within the left lower abdominal quadrant. The left lower abdomen was prepped and draped in the usual sterile fashion. 1% lidocaine was used for local anesthesia. An ultrasound image was saved for documentation purposed. An 8 Fr Safe-T-Centesis catheter was introduced. The paracentesis was performed. The catheter was removed and a dressing was applied. The patient tolerated the procedure well without immediate post procedural complication.  FINDINGS: A total of approximately 1.7 liters of serous/amber colored fluid was removed. Samples were sent to the laboratory as requested by the clinical team.  IMPRESSION: Successful ultrasound-guided paracentesis yielding 1.7 liters of peritoneal fluid.  Read By:  Pattricia Boss PA-C   Electronically Signed   By: Ruel Favors M.D.   On: 09/02/2014 13:39   US Paracentesis  08/30/2014   INDICATION: Ascites.  EXAM: ULTRASOUND-GUIDED PARACENTESIS  COMPARISON:  None.  MEDICATIONS: 10 cc 1% lidocaine  COMPLICATIONS: None immediate  TECHNIQUE:  Informed written consent was obtained from the patient after a discussion of the risks, benefits and alternatives to treatment. A timeout was performed prior to the initiation of the procedure.  Initial ultrasound scanning demonstrates a large amount of ascites within the left lower abdominal quadrant. The left Lower abdomen was prepped and draped in the usual sterile fashion. 1% lidocaine with epinephrine was used for local anesthesia. Under direct ultrasound guidance, a 19 gauge, 7-cm, Yueh catheter was introduced. An ultrasound image was saved for documentation purposed. the paracentesis was performed. The catheter was removed and a dressing was applied. The patient tolerated the procedure well without immediate post procedural complication.  FINDINGS: A total of approximately 1.2 liters of amber fluid was removed. Samples were sent to the laboratory as requested by the clinical team.  IMPRESSION: Successful ultrasound-guided paracentesis yielding 1.2 liters of peritoneal fluid.  Read by Robet Leu [facility]   Electronically Signed   By: Richarda Overlie M.D.   On: 08/30/2014 16:27    Labs:  CBC:  Recent Labs  08/30/14 0402 08/31/14 0620 09/01/14 0555 09/03/14 0805  WBC 2.4* 3.0* 2.8* 2.3*  HGB 13.2 13.5 13.0 12.8*  HCT 38.0* 38.1* 37.1* 36.4*  PLT 74* 72* 79* 84*    COAGS:  Recent Labs  09/05/13 0550 09/06/13 0530 08/30/14 0651  INR 1.22 1.21 1.15    BMP:  Recent Labs  08/30/14 0402 08/31/14 0620 09/01/14 0555 09/03/14 0805  NA 135 134* 137 134*  K 4.7 4.2 3.8 5.2*  CL 97 99 100 97  CO2 25 22 25 20   GLUCOSE 84 78 109* 89  BUN 46* 40* 31* 58*  CALCIUM 8.6 8.3* 8.5 8.5  CREATININE 6.65* 6.09* 5.37* 8.08*  GFRNONAA 8* 9* 10* 6*  GFRAA 9* 10* 12* 7*    LIVER FUNCTION TESTS:  Recent Labs  03/26/14 1150 08/30/14 0402 08/31/14 0620 09/01/14 0555 09/03/14 0805  BILITOT 3.1* 2.0* 2.0* 2.3*  --   AST 30 42* 37 41*  --   ALT <5 32 28 28  --   ALKPHOS 169* 228* 225* 225*   --   PROT 6.9 7.1 6.7 6.9  --   ALBUMIN 2.5* 2.8* 2.6* 2.7* 2.6*    TUMOR MARKERS:  Recent Labs  08/30/14 1034  CEA 1.2    Assessment and Plan: Bernette Redbird A  Daphine DeutscherMartin is a 63 y.o. 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 Request now received from GI for TJ liver biopsy/hepatic vein wedge pressure measurements. Details/risks of procedure d/w pt with his understanding and consent. Case tent planned for 12/29.     I spent a total of 20 minutes face to face in clinical consultation, greater than 50% of which was counseling/coordinating care for TJ liver biopsy/hepatic vein wedge pressure measurements.  Signed: Chinita PesterALLRED,D KEVIN 09/04/2014, 12:04 PM

## 2014-09-04 NOTE — Progress Notes (Signed)
Attempted to give renvela to pt with breakfast.  Pt stated "I already took it."  Pt alert and oriented.  Inquired if pt had medications in his room.  Pt did not respond.  Informed pt that it is against policy to keep medications from home at bedside and that we would store them in pharmacy.  Pt refused to show this nurse where medications were stored.  Informed charge nurse, Ginger, RN and spoke with pt again regarding hospital policy.  Pt refused.  AC called and the Chiropodist, Shanon Brow, to see pt and discuss situation.  Dr. Waymon Amato made aware.

## 2014-09-04 NOTE — Progress Notes (Signed)
  Echocardiogram 2D Echocardiogram has been performed.  Arvil Chaco 09/04/2014, 2:05 PM

## 2014-09-04 NOTE — Progress Notes (Addendum)
[facility] Progress Note   Subjective: alert, looks dyspneic, coughing off and on  Filed Vitals:   09/03/14 2126 09/03/14 2246 09/04/14 0554 09/04/14 0556  BP: 101/71 128/92 121/87   Pulse: 78 94 100   Temp: 99.6 F (37.6 C)  99.4 F (37.4 C)   TempSrc: Oral  Oral   Resp: 27  24   Height:      Weight:    94.847 kg (209 lb 1.6 oz)  SpO2: 98%  95%    Exam: Chronically-ill appearing Mild ^WOB at rest +JVD Chest bibasilar rales RRR no MRG Abd marked pendulant ascites, nontender 2+ bilat symmetric LE edema, no UE edema  ECHO April 2015 - LVEF 35-40%, diffuse HK, RV vol overload, pulm HTN peak 53mm Hg, RA/ RV both severely dilated, RV fxn moderately reduced CXR - 12/23 bibasilar airspace change c/w edema vs pna  HD: MWF [name] 4h 45min    F200    500/800    97kg    2/2.0 [place]    Heparin 3200   LUA AVF [name] 3 ug TIW    Assessment: 1. Ascites / anasarca - new onset, prob related to bivent heart failure and vol overload ; has long hx CHF and pulm HTH, last echo April 2015 w new severe RV dilatation, dec'd RV function 2. ESRD on HD MWF 3. Anemia - Hb up ,no ESA 4. 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 P 6.2 5. HTN - on labetalol, BP's lower side, marked vol overload 6. Hx PE / LLE DVT Dec 2014 7. Hx afib - no on AC d/t bleeding history  Plan- hold labetalol, daily HD until vol down, will consider adding midodrine    Vinson Moselleob Esker Dever MD  pager [phone]    cell [phone]  09/04/2014, 10:57 AM     Recent Labs Lab 08/31/14 0620 09/01/14 0555 09/03/14 0805  NA 134* 137 134*  K 4.2 3.8 5.2*  CL 99 100 97  CO2 22 25 20   GLUCOSE 78 109* 89  BUN 40* 31* 58*  CREATININE 6.09* 5.37* 8.08*  CALCIUM 8.3* 8.5 8.5  PHOS  --   --  6.2*    Recent Labs Lab 08/30/14 0402 08/31/14 0620 09/01/14 0555 09/03/14 0805  AST 42* 37 41*  --   ALT 32 28 28  --   ALKPHOS 228* 225* 225*  --   BILITOT 2.0* 2.0* 2.3*  --   PROT 7.1 6.7 6.9  --   ALBUMIN  2.8* 2.6* 2.7* 2.6*    Recent Labs Lab 08/30/14 0402 08/31/14 0620 09/01/14 0555 09/03/14 0805  WBC 2.4* 3.0* 2.8* 2.3*  NEUTROABS 1.5*  --   --   --   HGB 13.2 13.5 13.0 12.8*  HCT 38.0* 38.1* 37.1* 36.4*  MCV 82.6 80.2 82.4 79.6  PLT 74* 72* 79* 84*   . antiseptic oral rinse  7 mL Mouth Rinse BID  . aspirin EC  81 mg Oral Daily  . feeding supplement (NEPRO CARB STEADY)  237 mL Oral Daily  . labetalol  200 mg Oral BID  . multivitamin  1 tablet Oral QHS  . senna-docusate  2 tablet Oral BID  . sevelamer carbonate  3,200 mg Oral TID WC  . sodium chloride  3 mL Intravenous Q12H  . sodium chloride  3 mL Intravenous Q12H  . triamcinolone ointment  1 application Topical Daily     sodium chloride, sodium chloride, sodium chloride, acetaminophen **OR** acetaminophen, albuterol, feeding  supplement (NEPRO CARB STEADY), guaiFENesin-codeine, heparin, lidocaine (PF), lidocaine-prilocaine, morphine injection, ondansetron **OR** ondansetron (ZOFRAN) IV, oxyCODONE, pentafluoroprop-tetrafluoroeth, sevelamer carbonate, sodium chloride

## 2014-09-04 NOTE — Consult Note (Signed)
CARDIOLOGY CONSULT NOTE   Patient ID: Trevor SchwabKenny A Martin MRN: [id number], DOB/AGE: 61/11/1950   Admit date: 08/30/2014 Date of Consult: 09/04/2014   Primary Physician: Gwynneth AlimentSANDERS,ROBYN N, MD Primary Cardiologist: Dr. Mayford Knifeurner  Pt. Profile  63 year old African-American male with history of hypertension, ESRD on HD M/W/F, chronic atrial fibrillation on aspirin, history of syncope, obstructive sleep apnea, history of DVT/PE, PVD, and CAD status post remote MI in 1990s present with ascites and DOE  Problem List  Past Medical History  Diagnosis Date  . Anemia   . AVF (arteriovenous fistula)     Left  . Secondary hyperparathyroidism   . Hypovitaminosis D   . Hypertensive urgency     H/o  . CHF (congestive heart failure)   . Exertional shortness of breath     "related to infection in my lungs right now" (05/03/2013)  . History of gout     "before I started doing the dialysis" (05/03/2013)  . Sleep apnea   . GERD (gastroesophageal reflux disease)   . Syncope     felt secondary to residual anesthesia the day before - 2D echo unremarkable  . Pulmonary embolism     with right DVT secondary to recent surgery  . Atrial fibrillation     not on coumadin due to large spontaneous hematoma on back and anemia  . Myocardial infarction 90's  . Coronary artery disease   . Dysrhythmia     afib  . Peripheral vascular disease     dvt leg 12/14  . Hypertension   . ESRD (end stage renal disease) on dialysis     [facility] mon/wed/fri    Past Surgical History  Procedure Laterality Date  . Av fistula placement Left     Dr. Charlean SanfilippoFeilds; "I've had 2 on the left' (05/03/2013)  . Av fistula placement Right ~ 2011  . Knee arthroscopy Left   . Avgg removal Right 05/04/2013    Procedure: REMOVAL OF ARTERIOVENOUS Fistula Right Arm;  Surgeon: Sherren Kernsharles E Fields, MD;  [facility];  Service: Vascular;  Laterality: Right;  . Insertion of dialysis catheter Right 05/04/2013    Procedure: INSERTION OF DIALYSIS  CATHETER;  Surgeon: Sherren Kernsharles E Fields, MD;  [facility];  Service: Vascular;  Laterality: Right;  . Colonoscopy      Hx: of  . Bascilic vein transposition Left 06/27/2013    Procedure: BASCILIC VEIN TRANSPOSITION;  Surgeon: Sherren Kernsharles E Fields, MD;  [facility];  Service: Vascular;  Laterality: Left;  . Cardioversion N/A 08/29/2013    Procedure: CARDIOVERSION;  Surgeon: Quintella Reichertraci R [name], MD;  Location: [facility] ENDOSCOPY;  Service: Cardiovascular;  Laterality: N/A;  . Removal of a dialysis catheter  2/15  . Hernia repair  03/21/14    Umbilical hernia-Dr. Derrell Lollingamirez  . Umbilical hernia repair N/A 03/21/2014    Procedure: LAPAROSCOPIC UMBILICAL HERNIA REPAIR WITH MESH;  Surgeon: Axel FillerArmando Ramirez, MD;  Location: [facility] OR;  Service: General;  Laterality: N/A;  . Insertion of mesh N/A 03/21/2014    Procedure: INSERTION OF MESH;  Surgeon: Axel FillerArmando Ramirez, MD;  [facility];  Service: General;  Laterality: N/A;  . Venogram Left 05/26/2013    Procedure: VENOGRAM;  Surgeon: Fransisco HertzBrian L Chen, MD;  [facility];  Service: Cardiovascular;  Laterality: Left;     Allergies  Allergies  Allergen Reactions  . Pork-Derived Products Nausea And Vomiting    Culture    HPI   The patient is a 63 year old African-American male with history of  hypertension, ESRD on HD M/W/F, chronic atrial fibrillation on aspirin, history of syncope, obstructive sleep apnea, history of DVT/PE, PVD, and CAD status post remote MI in 1990s. According to the patient, he never underwent cardiac catheterization before in 1990s when he had MI, he was told later based on enzyme markers and EKG changes that he likely had MI. He has been receiving HD for the past 4 years. Earlier this year, patient had a substantial hematoma developed as the result of Coumadin. Although later he was cleared to restart on Coumadin, he adamantly refused further systemic anticoagulation therapy. He was placed on aspirin instead. He his last Myoview in August 2014  showed normal EF, inferior defect. His moderate pulmonary hypertension was felt to be related to PE. He's EF was preserved on echocardiogram in 2014, however echo in April 2015 showed EF down to 35-40%, diffuse hypokinesis, RV volume overload, mild AR, moderate to severe TR, peak PA pressure 53 mmHg. Echocardiogram was repeated in September 2015 which showed the EF remain 35-40%, diffuse hypokinesis, mild AR/MR, moderately dilated RV, severe TR. Since his hernia repair in July, patient has been having persistent abdominal discomfort. He started noting worsening abdominal discomfort and abdominal girth roughly 1 month after hernia repair. The last time he saw [name] in the office was on 08/29/2014 at which time he complained a lot of abdominal distention and pain. His weight appears to be stable, and he does not appear to be volume overloaded on exam. [name] referred him back to his PCP for further evaluation.  He presented to [facility] 08/30/2014 with abdominal discomfort and syncope occurred after cough. CT of abdomen on arrival showed 1.9 cm right hepatic lobe nodule, significantly increased moderate to large ascites, diffuse coronary artery calcification with cardiomegaly, spell and no hepatomegaly noted, diffuse soft tissue edema concerning for anasarca. Interventional radiology was consulted and 1.2 L amber fluid was removed via paracentesis on 12/23. Nephrology was consulted for dialysis. GI was consulted for chronic abdominal pain who recommended possible repeat paracentesis, AMA, ANCA, and percutaneous liver biopsy. Patient underwent repeat ultrasound guided paracentesis with 1.7 L amber colored fluid removed on 12/26 by interventional radiology. He is expected to undergo transjugular liver biopsy on 12/29. Cardiology has been consulted for heart failure.  Inpatient Medications  . antiseptic oral rinse  7 mL Mouth Rinse BID  . aspirin EC  81 mg Oral Daily  . feeding supplement  (NEPRO CARB STEADY)  237 mL Oral Daily  . multivitamin  1 tablet Oral QHS  . senna-docusate  2 tablet Oral BID  . sevelamer carbonate  3,200 mg Oral TID WC  . sodium chloride  3 mL Intravenous Q12H  . sodium chloride  3 mL Intravenous Q12H  . triamcinolone ointment  1 application Topical Daily    Family History Family History  Problem Relation Age of Onset  . Hypertension Father   . Kidney disease Father   . Allergies Father   . Deep vein thrombosis Sister   . Pulmonary embolism Sister   . Diabetes Paternal Grandmother      Social History History   Social History  . Marital Status: Single    Spouse Name: N/A    Number of Children: 4  . Years of Education: N/A   Occupational History  . Disabled    Social History Main Topics  . Smoking status: Former Smoker -- 0.25 packs/day for .5 years    Types: Cigarettes    Quit date:  09/08/1972  . Smokeless tobacco: Never Used  . Alcohol Use: Yes     Comment: 05/03/2013 "haven't had a glass of wine in ~ 3 months or so; sometimes will have one w/dinner"  . Drug Use: No  . Sexual Activity: Yes   Other Topics Concern  . Not on file   Social History Narrative   Former smoker- quit over 30 yrs ago   Patient is on disability     Review of Systems  General:  No chills, fever, night sweats or weight changes.  Cardiovascular:  No chest pain, orthopnea, palpitations, paroxysmal nocturnal dyspnea. + edema Dermatological: No rash, lesions/masses Respiratory: +cough, dyspnea on exertion Urologic: No hematuria, dysuria Abdominal:   No nausea, vomiting, diarrhea, bright red blood per rectum, melena, or hematemesis +abdominal girth and abdominal pain Neurologic:  No visual changes, wkns, changes in mental status. All other systems reviewed and are otherwise negative except as noted above.  Physical Exam  Blood pressure 108/86, pulse 87, temperature 98.6 F (37 C), temperature source Oral, resp. rate 28, height 6\' 1"  (1.854 m), weight  209 lb 1.6 oz (94.847 kg), SpO2 99 %.  General: Pleasant, NAD Psych: Normal affect. Neuro: Alert and oriented X 3. Moves all extremities spontaneously. HEENT: Normal  Neck: Supple without bruits or JVD. Lungs:  Resp regular and unlabored, CTA. Heart: regularly irregular no s3, s4, or murmurs. Abdomen: Soft, non-tender, non-distended, BS + x 4.  Extremities: No clubbing, cyanosis. DP/PT/Radials 2+ and equal bilaterally. LUE AVF noded, 1-2+ pitting edema.   Labs  No results for input(s): CKTOTAL, CKMB, TROPONINI in the last 72 hours. Lab Results  Component Value Date   WBC 2.3* 09/03/2014   HGB 12.8* 09/03/2014   HCT 36.4* 09/03/2014   MCV 79.6 09/03/2014   PLT 84* 09/03/2014    Recent Labs Lab 09/01/14 0555 09/03/14 0805  NA 137 134*  K 3.8 5.2*  CL 100 97  CO2 25 20  BUN 31* 58*  CREATININE 5.37* 8.08*  CALCIUM 8.5 8.5  PROT 6.9  --   BILITOT 2.3*  --   ALKPHOS 225*  --   ALT 28  --   AST 41*  --   GLUCOSE 109* 89   Lab Results  Component Value Date   CHOL 83 03/07/2014   HDL 54.70 03/07/2014   LDLCALC 19 03/07/2014   TRIG 49.0 03/07/2014   Lab Results  Component Value Date   DDIMER * 02/11/2007    2.61        AT THE INHOUSE ESTABLISHED CUTOFF VALUE OF 0.48 ug/mL FEU, THIS ASSAY HAS BEEN DOCUMENTED IN THE LITERATURE TO HAVE    Radiology/Studies  Ct Abdomen Pelvis Wo Contrast  08/30/2014   CLINICAL DATA:  Worsening abdominal distention and generalized abdominal pain, chronic in nature. Initial encounter.  EXAM: CT ABDOMEN AND PELVIS WITHOUT CONTRAST  TECHNIQUE: Multidetector CT imaging of the abdomen and pelvis was performed following the standard protocol without IV contrast.  COMPARISON:  CT of the abdomen and pelvis from 03/26/2014  FINDINGS: Mild patchy bilateral atelectasis is noted. Diffuse coronary artery calcifications are seen. Scattered calcified enlarged hilar and mediastinal nodes are again seen, measuring up to 3.0 cm in short axis.  Cardiomegaly is noted.  There is significantly increased moderate to large volume ascites within the abdomen and pelvis.  Hepatomegaly is again noted. A 1.9 cm focus of decreased attenuation within the right hepatic lobe, with overlying retraction of the hepatic capsule, appears to have increased in  size from the prior study. Malignancy cannot be excluded. Evaluation for additional hepatic masses is limited without contrast. The spleen is enlarged, as on the prior study, measuring 13.8 cm in length.  The pancreas and adrenal glands are unremarkable in appearance.  Severe bilateral renal atrophy is noted, with a few scattered renal cysts. Nonspecific perinephric stranding is noted bilaterally. There is no evidence of hydronephrosis. No renal or ureteral stones are seen.  The small bowel is unremarkable in appearance. The stomach is within normal limits. No acute vascular abnormalities are seen. Scattered calcification is seen along the abdominal aorta and its branches. The vasculature is difficult to fully assess without contrast.  The appendix is not definitely characterized given surrounding ascites. The colon is partially filled with stool and is grossly unremarkable in appearance, though difficult to fully characterize due to surrounding ascites.  The bladder is mildly distended and unremarkable in appearance. The prostate remains normal in size, with scattered calcification. Mildly enlarged bilateral inguinal nodes are seen, measuring up to 1.5 cm; some of this may reflect underlying soft tissue edema.  Diffuse soft tissue edema is again noted, raising concern for anasarca. This is mildly improved from the prior study.  No acute osseous abnormalities are identified. Diffuse uniform sclerotic change within the visualized osseous structures reflects renal osteodystrophy.  IMPRESSION: 1. Significantly increased moderate to large volume abdominopelvic ascites. 2. 1.9 cm focus of decreased attenuation within the  right hepatic lobe, with overlying retraction of the hepatic capsule, appears to have increased in size from the prior study. Malignancy cannot be excluded. Would correlate with LFTs and consider dynamic liver protocol CT for further evaluation. 3. Splenomegaly and hepatomegaly noted. 4. Cardiomegaly noted.  Diffuse coronary artery calcifications seen. 5. Scattered calcified enlarged hilar and mediastinal nodes again noted, measuring up to 3.0 cm in short axis. These were present in 2014, and thought to reflect remote granulomatous disease. 6. Mild patchy bilateral atelectasis noted. 7. Severe bilateral renal atrophy, with a few scattered renal cysts. 8. Scattered calcification along the abdominal aorta and its branches. 9. Mildly enlarged bilateral inguinal nodes, measuring up to 1.5 cm in short axis. This may partially reflect underlying soft tissue edema. 10. Diffuse soft tissue edema, raising concern for anasarca; this is mildly improved from the prior study. 11. Findings of renal osteodystrophy.   Electronically Signed   By: Roanna Raider M.D.   On: 08/30/2014 07:44   Dg Chest 2 View  08/30/2014   CLINICAL DATA:  Acute onset of shortness of breath and abdominal distention. Initial encounter.  EXAM: CHEST  2 VIEW  COMPARISON:  Chest radiograph performed 01/16/2014, and CT of the chest performed 09/01/2013  FINDINGS: The lungs are hypoexpanded. Bibasilar airspace opacities may reflect multifocal pneumonia or pulmonary edema. There is no evidence of pleural effusion or pneumothorax.  The heart is enlarged.  No acute osseous abnormalities are seen.  IMPRESSION: Lungs hypoexpanded. Bibasilar airspace opacities may reflect multifocal pneumonia or mild pulmonary edema. Cardiomegaly noted.   Electronically Signed   By: Roanna Raider M.D.   On: 08/30/2014 05:41   US Abdomen Complete  08/30/2014   CLINICAL DATA:  Abnormal liver function tests.  Ascites.  EXAM: ULTRASOUND ABDOMEN COMPLETE  COMPARISON:  None.   FINDINGS: Gallbladder: Cholelithiasis. Gallbladder wall thickening measuring 9.1 mm. Negative sonographic Murphy's sign. Hyperechoic foci along the gallbladder wall with ring down artifact as can be seen with adenomyomatosis.  Common bile duct: Diameter: 5.5 mm  Liver: No focal lesion identified. Increased  hepatic parenchymal echogenicity. There is hepatomegaly.  IVC: No abnormality visualized.  Pancreas: Visualized portion unremarkable.  Spleen: The spleen is enlarged measuring 720 mL. No focal splenic lesion.  Right Kidney: Length: 7.1 cm. Echogenicity within normal limits. No mass or hydronephrosis visualized.  Left Kidney: Length: 5.8 cm. There is an anechoic left renal mass with increased through transmission measuring 13 x 17 mm most consistent with a cyst. Echogenicity within normal limits. No hydronephrosis visualized.  Abdominal aorta: The proximal abdominal aorta measures 3 cm in AP diameter.  Other findings: There is moderate abdominal ascites.  IMPRESSION: 1. Cholelithiasis. Gallbladder wall thickening which may be secondary to intrinsic gallbladder wall abnormality suggest cholecystitis, but this appearance can be seen in the setting of hepatocellular disease and ascites. 2. Atrophic kidneys. 3. Splenomegaly. 4. The hypodense mass seen in the right hepatic lobe adjacent to the liver capsule on CT abdomen performed same day is not visualized on the ultrasound. Further evaluation with a CT or MRI of the abdomen is recommended optimized for evaluation of the liver. 5. Proximal abdominal aorta measures 3 cm in AP diameter. Recommend followup by [place] in 3 years. This recommendation follows ACR consensus guidelines: White Paper of the ACR Incidental Findings Committee II on Vascular Findings. Earlyne Iba Radiol 2013; 16:109-604   Electronically Signed   By: Elige Ko   On: 08/30/2014 17:55   Ct Abdomen Pelvis W Contrast  08/30/2014   CLINICAL DATA:  Abdominal tenderness in distension.  Liver lesion  EXAM:  CT ABDOMEN AND PELVIS WITH CONTRAST  TECHNIQUE: Multidetector CT imaging of the abdomen and pelvis was performed using the standard protocol following bolus administration of intravenous contrast.  CONTRAST:  80mL OMNIPAQUE IOHEXOL 300 MG/ML  SOLN  COMPARISON:  CT 08/30/2014 at 6:30 a.m.  FINDINGS: Lower chest: Interlobular septal thickening at the lung bases. No pleural fluid. Heart is enlarged. Pericardial fluid.  Hepatobiliary: There is reflux of contrast into the hepatic veins suggesting right heart dysfunction. Moderate volume of fluid surrounding the margin of the liver similar to prior. Slight decrease in the fluid in the pelvis. There is mild traction of the right hepatic lobe not changed from prior with a small hypodense lesions. Gallbladder is collapsed.  Pancreas: Difficult to evaluate the intra-abdominal organs due to ascites. No gross abnormality pancreas.  Spleen: Spleen is enlarged.  Adrenals/urinary tract: Adrenal is normal. Kidneys are atrophic. Bladder is collapsed. No your ureter abnormality  Stomach/Bowel: Stomach, small bowel, colon are unremarkable. There is progression of the oral contrast from the prior scan into the descending colon rectum.  Vascular/Lymphatic: Abdominal aorta is normal caliber no retroperitoneal periportal lymphadenopathy.  Reproductive: Prostate gland is normal.  Musculoskeletal: There is diffuse gross bone consistent with renal osteodystrophy.  Other: Anasarca  IMPRESSION: 1. Interval decrease in volume of intraperitoneal free fluid volume paracentesis. Moderate volume of ascites remains. 2. No other significant interval change in short interval follow-up from 6:30 a.m. same day 3. No evidence of bowel obstruction. 4. Splenomegaly 5. Evidence of right heart dysfunction with reflux of contrast into hepatic veins. 6. Interlobular septal thickening at the lung bases consistent with interstitial edema.   Electronically Signed   By: Genevive Bi M.D.   On: 08/30/2014 14:42    US Paracentesis  09/02/2014   INDICATION: Recurrent ascites, shortness of breath, request for paracentesis.  EXAM: ULTRASOUND-GUIDED PARACENTESIS  COMPARISON:  08/30/14.  MEDICATIONS: None.  COMPLICATIONS: None immediate  TECHNIQUE: Informed written consent was obtained from the patient after  a discussion of the risks, benefits and alternatives to treatment. A timeout was performed prior to the initiation of the procedure.  Initial ultrasound scanning demonstrates a large amount of ascites within the left lower abdominal quadrant. The left lower abdomen was prepped and draped in the usual sterile fashion. 1% lidocaine was used for local anesthesia. An ultrasound image was saved for documentation purposed. An 8 Fr Safe-T-Centesis catheter was introduced. The paracentesis was performed. The catheter was removed and a dressing was applied. The patient tolerated the procedure well without immediate post procedural complication.  FINDINGS: A total of approximately 1.7 liters of serous/amber colored fluid was removed. Samples were sent to the laboratory as requested by the clinical team.  IMPRESSION: Successful ultrasound-guided paracentesis yielding 1.7 liters of peritoneal fluid.  Read By:  Pattricia Boss PA-C   Electronically Signed   By: Ruel Favors M.D.   On: 09/02/2014 13:39   US Paracentesis  08/30/2014   INDICATION: Ascites.  EXAM: ULTRASOUND-GUIDED PARACENTESIS  COMPARISON:  None.  MEDICATIONS: 10 cc 1% lidocaine  COMPLICATIONS: None immediate  TECHNIQUE: Informed written consent was obtained from the patient after a discussion of the risks, benefits and alternatives to treatment. A timeout was performed prior to the initiation of the procedure.  Initial ultrasound scanning demonstrates a large amount of ascites within the left lower abdominal quadrant. The left Lower abdomen was prepped and draped in the usual sterile fashion. 1% lidocaine with epinephrine was used for local anesthesia. Under direct  ultrasound guidance, a 19 gauge, 7-cm, Yueh catheter was introduced. An ultrasound image was saved for documentation purposed. the paracentesis was performed. The catheter was removed and a dressing was applied. The patient tolerated the procedure well without immediate post procedural complication.  FINDINGS: A total of approximately 1.2 liters of amber fluid was removed. Samples were sent to the laboratory as requested by the clinical team.  IMPRESSION: Successful ultrasound-guided paracentesis yielding 1.2 liters of peritoneal fluid.  Read by Robet Leu [facility]   Electronically Signed   By: Richarda Overlie M.D.   On: 08/30/2014 16:27    ECG  A-fib, rate controlled  ASSESSMENT AND PLAN  1. 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 As for R HF, limited option. Unclear if R heart cath would offer much benefit in this case. Pending repeat echo today to check degree of RV failure. Certainly his degree of RV failure is greater expected with LV dysfunction, likely worsened by severe TR. May check with advanced HF to see if any option is available.  2. Massive ascietes: see #1, seen by GI, ?cirrhosis, no evidence of SBP 3. Chronic abdominal pain:   - hepatosplenomegaly noted on CT, reflux of contrast into hepatic vein suggesting R heart dysfunction 4. ESRD on HD MWF 5. CAD s/p remote MI in 1990s, no recent sign of angina 6. Liver lesion: pending biopsy tomorrow 7. Syncope: per IM 8. Hypertension 9. Chronic atrial fibrillation on aspirin: refused coumadin given h/o significant hematoma  - rate controlled. Although return of atrial kick may improve HF symptom, however risk of stroke on ASA alone is too high and pt does not want systemic anticoagulation 10. obstructive sleep apnea 11. history of  DVT/PE 12. PVD  Signed, Azalee Course, PA-C 09/04/2014, 3:00 PM

## 2014-09-04 NOTE — Progress Notes (Signed)
Daily Rounding Note  09/04/2014, 8:13 AM  LOS: 5 days   SUBJECTIVE:       Abdominal pain improved, still with protuberant abdomen.  No nausea, appetite fair, daily BMs.   OBJECTIVE:         Vital signs in last 24 hours:    Temp:  [97.8 F (36.6 C)-99.6 F (37.6 C)] 99.4 F (37.4 C) (12/28 0554) Pulse Rate:  [78-105] 100 (12/28 0554) Resp:  [20-42] 24 (12/28 0554) BP: (101-161)/(71-105) 121/87 mmHg (12/28 0554) SpO2:  [92 %-98 %] 95 % (12/28 0554) Weight:  [208 lb 12.4 oz (94.7 kg)-209 lb 1.6 oz (94.847 kg)] 209 lb 1.6 oz (94.847 kg) (12/28 0556) Last BM Date: 09/03/14 Filed Weights   09/03/14 0750 09/03/14 1211 09/04/14 0556  Weight: 215 lb 9.8 oz (97.8 kg) 208 lb 12.4 oz (94.7 kg) 209 lb 1.6 oz (94.847 kg)   General: looks chronically ill.    Heart: RRR.   Chest: clear bil though dyspneic with minor activity Abdomen: prtuberant/distended but soft, not tender.  BS hypoactive.   Extremities: trace ankle edema Neuro/Psych:  Pleasant, alert, no gross deficits DERM:  Pimple like lesions with associated ulcers all over his trunk and limbs.   Intake/Output from previous day: 12/27 0701 - 12/28 0700 In: 243 [P.O.:240; I.V.:3] Out: 2993   Intake/Output this shift:    Lab Results:  Recent Labs  09/03/14 0805  WBC 2.3*  HGB 12.8*  HCT 36.4*  PLT 84*   BMET  Recent Labs  09/03/14 0805  NA 134*  K 5.2*  CL 97  CO2 20  GLUCOSE 89  BUN 58*  CREATININE 8.08*  CALCIUM 8.5   LFT  Recent Labs  09/03/14 0805  ALBUMIN 2.6*    Studies/Results: US Paracentesis  09/02/2014   INDICATION: Recurrent ascites, shortness of breath, request for paracentesis.  EXAM: ULTRASOUND-GUIDED PARACENTESIS  COMPARISON:  08/30/14.  MEDICATIONS: None.  COMPLICATIONS: None immediate  TECHNIQUE: Informed written consent was obtained from the patient after a discussion of the risks, benefits and alternatives to treatment. A  timeout was performed prior to the initiation of the procedure.  Initial ultrasound scanning demonstrates a large amount of ascites within the left lower abdominal quadrant. The left lower abdomen was prepped and draped in the usual sterile fashion. 1% lidocaine was used for local anesthesia. An ultrasound image was saved for documentation purposed. An 8 Fr Safe-T-Centesis catheter was introduced. The paracentesis was performed. The catheter was removed and a dressing was applied. The patient tolerated the procedure well without immediate post procedural complication.  FINDINGS: A total of approximately 1.7 liters of serous/amber colored fluid was removed. Samples were sent to the laboratory as requested by the clinical team.  IMPRESSION: Successful ultrasound-guided paracentesis yielding 1.7 liters of peritoneal fluid.  Read By:  Pattricia Boss PA-C   Electronically Signed   By: Ruel Favors M.D.   On: 09/02/2014 13:39   Scheduled Meds: . antiseptic oral rinse  7 mL Mouth Rinse BID  . aspirin EC  81 mg Oral Daily  . feeding supplement (NEPRO CARB STEADY)  237 mL Oral Daily  . labetalol  200 mg Oral BID  . multivitamin  1 tablet Oral QHS  . senna-docusate  2 tablet Oral BID  . sevelamer carbonate  3,200 mg Oral TID WC  . sodium chloride  3 mL Intravenous Q12H  . sodium chloride  3 mL Intravenous Q12H  . triamcinolone ointment  1 application Topical Daily   Continuous Infusions:  PRN Meds:.sodium chloride, sodium chloride, sodium chloride, acetaminophen **OR** acetaminophen, albuterol, feeding supplement (NEPRO CARB STEADY), guaiFENesin-codeine, heparin, lidocaine (PF), lidocaine-prilocaine, morphine injection, ondansetron **OR** ondansetron (ZOFRAN) IV, oxyCODONE, pentafluoroprop-tetrafluoroeth, sevelamer carbonate, sodium chloride  ASSESMENT:   *  Ascites.  S/p Paracentesis 12/23 x 1.2 liters, 12/26 x 1.7 liters. 240 WBCs on #1 with no WBCs and no growth; 273 WBCs on #2 with rare WBCs (PMNs and  Monos, no organisms on stain, final clx pending)   *  Chronic abdominal pain, appears to be secondary to ascites.  Improved after paracentesis. Began after 03/2014 umbilical herniorrhaphy.   *  Hepatosplenomegaly.  Ascites raises question of cirrhosis.  Transjugular liver bx ordered 12/27. ANA, AMA, smooth muscle Ab pending. .  ACE level elevated 08/2013 at 69 (rr: 8-52).  Coags normal, + thrombocytopenia.    *  Right lobe liver lesion.  Felt to be hemangioma.   *  ESRD.  MWF HD.    PLAN   *  Transjugular liver biopsy.  Pt currently eating AM meal, has yet to be seen by radiology APP, hence case will be done tomorrow. The APP will write for NPO once he sees pt.       Jennye MoccasinSarah Gribbin  09/04/2014, 8:13 AM Pager: [phone]  Would be helpful to have hepatic vein wedge pressure mesurements also  Iva Booparl E. Anushka Hartinger, MD, [facility]asta[facility]ACG

## 2014-09-04 NOTE — Progress Notes (Signed)
Patient refusing to give up Renvela 800mg  tabs which he has been taking in his room with meals. Policy of not taking medications from home when [facility] can supply them was explained to patient by multiple disciplines including MD. He complains that he is not given his Revela in a timely manner with regard to his meals (within ). He refused to even let RN see his pills or where he had them.  Dr. Rogelia Mire told pt he could take his pills from home. I had a conversation with patient explaining the liability for nursing when he takes unwitnessed, unidentified medications in his room. I did a positive pill identification on his Renvela. We agreed he can keep them in his room, but MUST call the RN to [facility] him taking them each time with meals. He was completely agreeable.  Sharine Cadle S. Merilynn Finland, PharmD, BCPS Clinical Staff Pharmacist Pager [phone]

## 2014-09-04 NOTE — Progress Notes (Signed)
PROGRESS NOTE    Trevor [name]:[id number]:[id number] DOB: 02/12/1951 DOA: 08/30/2014 PCP: Gwynneth AlimentSANDERS,ROBYN N, MD  HPI/Brief narrative 63 year old male patient with history of ESRD on HD MWF, chronic systolic CHF, A. 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 He had vigorous coughing spells and passed out. He saw his cardiologist and was advised to come to the hospital. He underwent diagnostic and therapeutic paracentesis by IR. Nephrology consulting for dialysis needs. GI consulted.   Assessment/Plan:  1. Massive ascites:? Etiology. Possibly from Cirrhosis related to passive congestion from RHF. Status post 1.2 L paracentesis by IR on 12/23 & 1.7 L on 12/26. Ascitic fluid not in keeping with SBP. GI input appreciated-likely has cirrhosis with portal hypertension accounting for ascites and splenomegaly. Awaiting transjugular liver biopsy to evaluate for cirrhosis (planned 12/29), no need for HIDA scan and follow up ANA & Mitochondrial Ab's. 2. Chronic abdominal pain/hepatosplenomegaly: Unclear etiology. States started after hernia repair but no postoperative complications. Possibly related to ascites-improved after tap. GI follow-up appreciated. 3. ESRD on MWF HD: Management per nephrology.? Increased volume removal across dialysis. Nephrology plans to dialyze daily until improvement of volume status. 4. Liver lesion seen on CT but not ultrasound:? MRI of liver. GI input appreciated. 5. Syncope: Possibly cough syncope. Initial troponin minimally elevated which could be secondary to renal failure but subsequent troponins have been negative. No clinical pneumonia. 6. History of chronic A. fib: Rate controlled in the 90s. Warfarin anticoagulation discontinued  secondary to spontaneous back hematoma and patient has declined anticoagulation since. 7. History of essential hypertension: Mildly uncontrolled. Monitor 8. 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 Acute on chronic biventricular failure, predominantly right heart failure: Volume overloaded-management across dialysis. LVEF by echo on 05/09/14 showed EF of 35-40 percent with diffuse hypokinesis. Will repeat 2-D echo. Requested cardiology consultation due to concern for right heart failure causing ongoing dyspnea, cirrhosis and ascites. 10. Leukopenia and thrombocytopenia:? Secondary to liver disease. Follow CBCs. Stable.? Hypersplenism. 11. Chronic cough: States that he has had it for a year and etiology is not clear.   Code Status: Full  Family Communication: None at bedside. Disposition Plan: Home when medically stable   Consultants:  Nephrology  Gastroenterology  Cardiology  Procedures:  Paracentesis 1.2 L by IR on 12/23 and 1.7 L on 12/26  Hemodialysis  Antibiotics:  None   Subjective: Chronic cough. Some dyspnea.  Objective: Filed Vitals:   09/04/14 1511 09/04/14 1530 09/04/14 1600 09/04/14 1630  BP: 108/82 120/86 126/90 148/91  Pulse: 80 70 82 73  Temp:      TempSrc:      Resp:    22  Height:      Weight:      SpO2:       temperature 98.49F, oxygen saturation 99%  Intake/Output Summary (Last 24 hours) at 09/04/14 1651 Last data filed at 09/04/14 0908  Gross per 24 hour  Intake    123 ml  Output      0 ml  Net    123 ml   Filed Weights   09/03/14 1211 09/04/14 0556 09/04/14 1506  Weight: 94.7 kg (208 lb 12.4 oz) 94.847 kg (209 lb 1.6 oz) 96 kg (211 lb 10.3 oz)     Exam:  General exam: Moderately built and nourished middle-aged male sitting  up in bed with mild respiratory distress. Dilated veins of upper trunk and neck. Respiratory system: Diminished breath sounds in the bases with occasional crackles otherwise clear to auscultation. No increased  work of breathing. Cardiovascular system: S1 & S2 heard, irregularly irregular. No JVD, murmurs, gallops, clicks. Trace bilateral ankle edema.  Gastrointestinal system: Abdomen is moderately distended(decreased), soft and nontender. Ascites + +. Normal bowel sounds heard. Central nervous system: Alert and oriented. No focal neurological deficits. Extremities: Symmetric 5 x 5 power.   Data Reviewed: Basic Metabolic Panel:  Recent Labs Lab 08/30/14 0402 08/31/14 0620 09/01/14 0555 09/02/14 0552 09/03/14 0805  NA 135 134* 137  --  134*  K 4.7 4.2 3.8  --  5.2*  CL 97 99 100  --  97  CO2 25 22 25   --  20  GLUCOSE 84 78 109*  --  89  BUN 46* 40* 31*  --  58*  CREATININE 6.65* 6.09* 5.37*  --  8.08*  CALCIUM 8.6 8.3* 8.5  --  8.5  MG  --   --   --  2.4  --   PHOS  --   --   --   --  6.2*   Liver Function Tests:  Recent Labs Lab 08/30/14 0402 08/31/14 0620 09/01/14 0555 09/03/14 0805  AST 42* 37 41*  --   ALT 32 28 28  --   ALKPHOS 228* 225* 225*  --   BILITOT 2.0* 2.0* 2.3*  --   PROT 7.1 6.7 6.9  --   ALBUMIN 2.8* 2.6* 2.7* 2.6*   No results for input(s): LIPASE, AMYLASE in the last 168 hours. No results for input(s): AMMONIA in the last 168 hours. CBC:  Recent Labs Lab 08/30/14 0402 08/31/14 0620 09/01/14 0555 09/03/14 0805 09/04/14 1519  WBC 2.4* 3.0* 2.8* 2.3* 2.6*  NEUTROABS 1.5*  --   --   --   --   HGB 13.2 13.5 13.0 12.8* 13.0  HCT 38.0* 38.1* 37.1* 36.4* 37.6*  MCV 82.6 80.2 82.4 79.6 81.9  PLT 74* 72* 79* 84* PENDING   Cardiac Enzymes:  Recent Labs Lab 08/30/14 1034 08/30/14 1546 08/30/14 2146  TROPONINI 0.04* <0.03 0.03   BNP (last 3 results)  Recent Labs  08/29/14 1421  PROBNP 1830.0*   CBG: No results for input(s): GLUCAP in the last 168 hours.  Recent Results (from the past 240 hour(s))  Body fluid culture     Status: None   Collection Time: 08/30/14  1:27 PM  Result Value Ref Range Status   Specimen Description FLUID ABDOMEN  ASCITIC  Final   Special Requests NONE  Final   Gram Stain   Final    NO WBC SEEN NO ORGANISMS SEEN Performed at [facility]    Culture   Final    NO GROWTH 3 DAYS Performed at [facility]    Report Status 09/02/2014 FINAL  Final  AFB culture with smear     Status: None (Preliminary result)   Collection Time: 08/30/14  1:27 PM  Result Value Ref Range Status   Specimen Description FLUID ABDOMEN ASCITIC  Final   Special Requests NONE  Final   Acid Fast Smear   Final    NO ACID FAST BACILLI SEEN Performed at [facility]    Culture   Final    CULTURE WILL BE EXAMINED FOR 6 WEEKS BEFORE ISSUING A FINAL REPORT Performed at [facility]    Report Status  PENDING  Incomplete  Body fluid culture     Status: None (Preliminary result)   Collection Time: 09/02/14  1:44 PM  Result Value Ref Range Status   Specimen Description PERITONEAL FLUID  Final   Special Requests NONE  Final   Gram Stain   Final    RARE WBC PRESENT,BOTH PMN AND MONONUCLEAR NO ORGANISMS SEEN Performed at [facility]    Culture   Final    NO GROWTH 2 DAYS Performed at [facility]    Report Status PENDING  Incomplete          Studies: No results found.      Scheduled Meds: . antiseptic oral rinse  7 mL Mouth Rinse BID  . aspirin EC  81 mg Oral Daily  . feeding supplement (NEPRO CARB STEADY)  237 mL Oral Daily  . multivitamin  1 tablet Oral QHS  . senna-docusate  2 tablet Oral BID  . sevelamer carbonate  3,200 mg Oral TID WC  . sodium chloride  3 mL Intravenous Q12H  . sodium chloride  3 mL Intravenous Q12H  . triamcinolone ointment  1 application Topical Daily   Continuous Infusions:   Principal Problem:   Ascites Active Problems:   Atrial fibrillation   ESRD on hemodialysis   Cough syncope   Thrombocytopenia   Leukopenia   Abnormal LFTs   Pedal edema   Abdominal pain, chronic, epigastric   Chronic atrial fibrillation    Time  spent: 25 minutes.    Marcellus Scott, MD, FACP, FHM. [facility] Pager [phone]  If 7PM-7AM, please contact night-coverage [email or web address] Password [email or web address] 09/04/2014, 4:51 PM    LOS: 5 days

## 2014-09-05 ENCOUNTER — Inpatient Hospital Stay (HOSPITAL_COMMUNITY): Payer: Medicare Other

## 2014-09-05 ENCOUNTER — Encounter (HOSPITAL_COMMUNITY): Payer: Self-pay | Admitting: Radiology

## 2014-09-05 DIAGNOSIS — I5041 Acute combined systolic (congestive) and diastolic (congestive) heart failure: Secondary | ICD-10-CM

## 2014-09-05 DIAGNOSIS — I5043 Acute on chronic combined systolic (congestive) and diastolic (congestive) heart failure: Secondary | ICD-10-CM | POA: Diagnosis present

## 2014-09-05 DIAGNOSIS — K745 Biliary cirrhosis, unspecified: Secondary | ICD-10-CM

## 2014-09-05 LAB — COMPREHENSIVE METABOLIC PANEL
ALT: 26 U/L (ref 0–53)
AST: 50 U/L — AB (ref 0–37)
Albumin: 2.6 g/dL — ABNORMAL LOW (ref 3.5–5.2)
Alkaline Phosphatase: 220 U/L — ABNORMAL HIGH (ref 39–117)
Anion gap: 13 (ref 5–15)
BUN: 27 mg/dL — ABNORMAL HIGH (ref 6–23)
CALCIUM: 8.8 mg/dL (ref 8.4–10.5)
CO2: 26 mmol/L (ref 19–32)
CREATININE: 4.89 mg/dL — AB (ref 0.50–1.35)
Chloride: 100 mEq/L (ref 96–112)
GFR calc Af Amer: 13 mL/min — ABNORMAL LOW (ref >90)
GFR, EST NON AFRICAN AMERICAN: 12 mL/min — AB (ref >90)
Glucose, Bld: 96 mg/dL (ref 70–99)
Potassium: 4.4 mmol/L (ref 3.5–5.1)
Sodium: 139 mmol/L (ref 135–145)
TOTAL PROTEIN: 7 g/dL (ref 6.0–8.3)
Total Bilirubin: 2.7 mg/dL — ABNORMAL HIGH (ref 0.3–1.2)

## 2014-09-05 LAB — CBC
HCT: 42.2 % (ref 39.0–52.0)
Hemoglobin: 14.6 g/dL (ref 13.0–17.0)
MCH: 28.7 pg (ref 26.0–34.0)
MCHC: 34.6 g/dL (ref 30.0–36.0)
MCV: 82.9 fL (ref 78.0–100.0)
PLATELETS: 97 10*3/uL — AB (ref 150–400)
RBC: 5.09 MIL/uL (ref 4.22–5.81)
RDW: 18.4 % — ABNORMAL HIGH (ref 11.5–15.5)
WBC: 4.7 10*3/uL (ref 4.0–10.5)

## 2014-09-05 LAB — PROTIME-INR
INR: 1.3 (ref 0.00–1.49)
PROTHROMBIN TIME: 16.4 s — AB (ref 11.6–15.2)

## 2014-09-05 LAB — RENAL FUNCTION PANEL
ALBUMIN: 2.6 g/dL — AB (ref 3.5–5.2)
Anion gap: 12 (ref 5–15)
BUN: 38 mg/dL — ABNORMAL HIGH (ref 6–23)
CO2: 24 mmol/L (ref 19–32)
Calcium: 8.6 mg/dL (ref 8.4–10.5)
Chloride: 98 mEq/L (ref 96–112)
Creatinine, Ser: 5.91 mg/dL — ABNORMAL HIGH (ref 0.50–1.35)
GFR, EST AFRICAN AMERICAN: 11 mL/min — AB (ref >90)
GFR, EST NON AFRICAN AMERICAN: 9 mL/min — AB (ref >90)
Glucose, Bld: 89 mg/dL (ref 70–99)
POTASSIUM: 4.7 mmol/L (ref 3.5–5.1)
Phosphorus: 4.5 mg/dL (ref 2.3–4.6)
SODIUM: 134 mmol/L — AB (ref 135–145)

## 2014-09-05 LAB — BODY FLUID CULTURE: Culture: NO GROWTH

## 2014-09-05 LAB — APTT: APTT: 36 s (ref 24–37)

## 2014-09-05 LAB — ANTI-SMOOTH MUSCLE ANTIBODY, IGG: F-Actin IgG: 44 U — ABNORMAL HIGH (ref <20)

## 2014-09-05 MED ORDER — LIDOCAINE HCL (PF) 1 % IJ SOLN: 5.0000 mL | INTRAMUSCULAR | Status: DC | PRN

## 2014-09-05 MED ORDER — DIPHENHYDRAMINE HCL 12.5 MG/5ML PO ELIX
25.0000 mg | ORAL_SOLUTION | Freq: Once | ORAL | Status: AC
  Administered 2014-09-05: 25 mg via ORAL
  Filled 2014-09-05: qty 10

## 2014-09-05 MED ORDER — PENTAFLUOROPROP-TETRAFLUOROETH EX AERO
1.0000 | INHALATION_SPRAY | CUTANEOUS | Status: DC | PRN
Start: 2014-09-05 — End: 2014-09-07

## 2014-09-05 MED ORDER — IOHEXOL 300 MG/ML  SOLN
50.0000 mL | Freq: Once | INTRAMUSCULAR | Status: AC | PRN
  Administered 2014-09-05: 20 mL via INTRAVENOUS

## 2014-09-05 MED ORDER — SODIUM CHLORIDE 0.9 % IV SOLN: 100.0000 mL | INTRAVENOUS | Status: DC | PRN

## 2014-09-05 MED ORDER — MIDAZOLAM HCL 2 MG/2ML IJ SOLN
INTRAMUSCULAR | Status: AC
  Filled 2014-09-05: qty 4

## 2014-09-05 MED ORDER — LIDOCAINE-PRILOCAINE 2.5-2.5 % EX CREA: 1.0000 "application " | TOPICAL_CREAM | CUTANEOUS | Status: DC | PRN

## 2014-09-05 MED ORDER — IOHEXOL 350 MG/ML SOLN
100.0000 mL | Freq: Once | INTRAVENOUS | Status: AC | PRN
  Administered 2014-09-05: 100 mL via INTRAVENOUS

## 2014-09-05 MED ORDER — LIDOCAINE HCL 1 % IJ SOLN
INTRAMUSCULAR | Status: AC
  Filled 2014-09-05: qty 20

## 2014-09-05 MED ORDER — FENTANYL CITRATE 0.05 MG/ML IJ SOLN
INTRAMUSCULAR | Status: AC | PRN
  Administered 2014-09-05: 25 ug via INTRAVENOUS

## 2014-09-05 MED ORDER — ALTEPLASE 2 MG IJ SOLR
2.0000 mg | Freq: Once | INTRAMUSCULAR | Status: AC | PRN
  Filled 2014-09-05: qty 2

## 2014-09-05 MED ORDER — NEPRO/CARBSTEADY PO LIQD
237.0000 mL | ORAL | Status: DC | PRN
  Administered 2014-09-06: 237 mL via ORAL

## 2014-09-05 MED ORDER — MIDAZOLAM HCL 2 MG/2ML IJ SOLN
INTRAMUSCULAR | Status: AC | PRN
  Administered 2014-09-05: 1 mg via INTRAVENOUS

## 2014-09-05 MED ORDER — FENTANYL CITRATE 0.05 MG/ML IJ SOLN
INTRAMUSCULAR | Status: AC
  Filled 2014-09-05: qty 4

## 2014-09-05 MED ORDER — HEPARIN SODIUM (PORCINE) 1000 UNIT/ML DIALYSIS
1000.0000 [IU] | INTRAMUSCULAR | Status: DC | PRN
  Filled 2014-09-05: qty 1

## 2014-09-05 NOTE — Sedation Documentation (Signed)
Patient is resting comfortably. 

## 2014-09-05 NOTE — Sedation Documentation (Signed)
Biopsy performed 

## 2014-09-05 NOTE — Progress Notes (Signed)
Patient Name: Lanice SchwabKenny A Martin Date of Encounter: 09/05/2014     Principal Problem:   Ascites Active Problems:   Atrial fibrillation   ESRD on hemodialysis   Cough syncope   Thrombocytopenia   Leukopenia   Abnormal LFTs   Pedal edema   Abdominal pain, chronic, epigastric   Chronic atrial fibrillation   Cirrhosis   RVF (right ventricular failure)    SUBJECTIVE  Slept ok. Continue to have cough, appears to be chronic. Pending liver biopsy this afternoon. Dialyzed this morning for volume overload. Abdominal distension has significant improved.   CURRENT MEDS . antiseptic oral rinse  7 mL Mouth Rinse BID  . aspirin EC  81 mg Oral Daily  . feeding supplement (NEPRO CARB STEADY)  237 mL Oral Daily  . multivitamin  1 tablet Oral QHS  . senna-docusate  2 tablet Oral BID  . sevelamer carbonate  3,200 mg Oral TID WC  . sodium chloride  3 mL Intravenous Q12H  . sodium chloride  3 mL Intravenous Q12H  . triamcinolone ointment  1 application Topical Daily    OBJECTIVE  Filed Vitals:   09/05/14 0930 09/05/14 1000 09/05/14 1030 09/05/14 1044  BP: 119/77 133/88 98/31 127/96  Pulse: 87 89 112 100  Temp:    98.2 F (36.8 C)  TempSrc:      Resp:    22  Height:      Weight:    193 lb 2 oz (87.6 kg)  SpO2:    96%    Intake/Output Summary (Last 24 hours) at 09/05/14 1221 Last data filed at 09/05/14 1044  Gross per 24 hour  Intake      0 ml  Output   6500 ml  Net  -6500 ml   Filed Weights   09/05/14 0531 09/05/14 0708 09/05/14 1044  Weight: 206 lb (93.441 kg) 200 lb 13.4 oz (91.1 kg) 193 lb 2 oz (87.6 kg)    PHYSICAL EXAM  General:  NAD. Flat affect Neuro: Alert and oriented X 3. Moves all extremities spontaneously. Psych: Normal affect. HEENT:  Normal  Neck: Supple without bruits  Lungs:  Resp regular and unlabored, CTA. Occasional rhonchi Heart: irregular, mildly tachycardic no s3, s4, or murmurs. Abdomen: Soft, non-tender, non-distended, BS + x 4.  Extremities:  No clubbing, cyanosis or edema. DP/PT/Radials 2+ and equal bilaterally.  Accessory Clinical Findings  CBC  Recent Labs  09/03/14 0805 09/04/14 1519  WBC 2.3* 2.6*  HGB 12.8* 13.0  HCT 36.4* 37.6*  MCV 79.6 81.9  PLT 84* 82*   Basic Metabolic Panel  Recent Labs  09/04/14 1519 09/05/14 0713  NA 134* 134*  K 4.9 4.7  CL 97 98  CO2 24 24  GLUCOSE 83 89  BUN 49* 38*  CREATININE 6.94* 5.91*  CALCIUM 8.6 8.6  PHOS 5.2* 4.5   Liver Function Tests  Recent Labs  09/04/14 1519 09/05/14 0713  ALBUMIN 2.6* 2.6*    TELE Not on telemetry    ECG  No new EKG  Echocardiogram 09/04/2014  LV EF: 25% -  30%  ------------------------------------------------------------------- Indications:   Cardiomyopathy - dilated 425.4.  ------------------------------------------------------------------- History:  PMH: Ascites. ESRD. Pulmonary embolus. Syncope. Atrial flutter. Atrial fibrillation. Coronary artery disease. Congestive heart failure. Risk factors: Hypertension.  ------------------------------------------------------------------- Study Conclusions  - Left ventricle: The cavity size was moderately dilated. Wall thickness was increased in a pattern of moderate LVH. Systolic function was severely reduced. The estimated ejection fraction was in the range of 25%  to 30%. Diffuse hypokinesis. Akinesis of the anteroseptal myocardium. - Ventricular septum: The contour showed diastolic flattening. - Aortic valve: There was mild regurgitation. - Mitral valve: There was mild regurgitation. - Left atrium: The atrium was severely dilated. - Right ventricle: The cavity size was severely dilated. Systolic function was mildly to moderately reduced. - Right atrium: The atrium was massively dilated. - Tricuspid valve: There was severe regurgitation. - Pulmonary arteries: Systolic pressure was moderately to severely increased. PA peak pressure: 66 mm Hg (S).      Radiology/Studies  Ct Abdomen Pelvis Wo Contrast  08/30/2014   CLINICAL DATA:  Worsening abdominal distention and generalized abdominal pain, chronic in nature. Initial encounter.  EXAM: CT ABDOMEN AND PELVIS WITHOUT CONTRAST  TECHNIQUE: Multidetector CT imaging of the abdomen and pelvis was performed following the standard protocol without IV contrast.  COMPARISON:  CT of the abdomen and pelvis from 03/26/2014  FINDINGS: Mild patchy bilateral atelectasis is noted. Diffuse coronary artery calcifications are seen. Scattered calcified enlarged hilar and mediastinal nodes are again seen, measuring up to 3.0 cm in short axis. Cardiomegaly is noted.  There is significantly increased moderate to large volume ascites within the abdomen and pelvis.  Hepatomegaly is again noted. A 1.9 cm focus of decreased attenuation within the right hepatic lobe, with overlying retraction of the hepatic capsule, appears to have increased in size from the prior study. Malignancy cannot be excluded. Evaluation for additional hepatic masses is limited without contrast. The spleen is enlarged, as on the prior study, measuring 13.8 cm in length.  The pancreas and adrenal glands are unremarkable in appearance.  Severe bilateral renal atrophy is noted, with a few scattered renal cysts. Nonspecific perinephric stranding is noted bilaterally. There is no evidence of hydronephrosis. No renal or ureteral stones are seen.  The small bowel is unremarkable in appearance. The stomach is within normal limits. No acute vascular abnormalities are seen. Scattered calcification is seen along the abdominal aorta and its branches. The vasculature is difficult to fully assess without contrast.  The appendix is not definitely characterized given surrounding ascites. The colon is partially filled with stool and is grossly unremarkable in appearance, though difficult to fully characterize due to surrounding ascites.  The bladder is mildly distended and  unremarkable in appearance. The prostate remains normal in size, with scattered calcification. Mildly enlarged bilateral inguinal nodes are seen, measuring up to 1.5 cm; some of this may reflect underlying soft tissue edema.  Diffuse soft tissue edema is again noted, raising concern for anasarca. This is mildly improved from the prior study.  No acute osseous abnormalities are identified. Diffuse uniform sclerotic change within the visualized osseous structures reflects renal osteodystrophy.  IMPRESSION: 1. Significantly increased moderate to large volume abdominopelvic ascites. 2. 1.9 cm focus of decreased attenuation within the right hepatic lobe, with overlying retraction of the hepatic capsule, appears to have increased in size from the prior study. Malignancy cannot be excluded. Would correlate with LFTs and consider dynamic liver protocol CT for further evaluation. 3. Splenomegaly and hepatomegaly noted. 4. Cardiomegaly noted.  Diffuse coronary artery calcifications seen. 5. Scattered calcified enlarged hilar and mediastinal nodes again noted, measuring up to 3.0 cm in short axis. These were present in 2014, and thought to reflect remote granulomatous disease. 6. Mild patchy bilateral atelectasis noted. 7. Severe bilateral renal atrophy, with a few scattered renal cysts. 8. Scattered calcification along the abdominal aorta and its branches. 9. Mildly enlarged bilateral inguinal nodes, measuring up to 1.5 cm  in short axis. This may partially reflect underlying soft tissue edema. 10. Diffuse soft tissue edema, raising concern for anasarca; this is mildly improved from the prior study. 11. Findings of renal osteodystrophy.   Electronically Signed   By: Roanna Raider M.D.   On: 08/30/2014 07:44   Dg Chest 2 View  08/30/2014   CLINICAL DATA:  Acute onset of shortness of breath and abdominal distention. Initial encounter.  EXAM: CHEST  2 VIEW  COMPARISON:  Chest radiograph performed 01/16/2014, and CT of the  chest performed 09/01/2013  FINDINGS: The lungs are hypoexpanded. Bibasilar airspace opacities may reflect multifocal pneumonia or pulmonary edema. There is no evidence of pleural effusion or pneumothorax.  The heart is enlarged.  No acute osseous abnormalities are seen.  IMPRESSION: Lungs hypoexpanded. Bibasilar airspace opacities may reflect multifocal pneumonia or mild pulmonary edema. Cardiomegaly noted.   Electronically Signed   By: Roanna Raider M.D.   On: 08/30/2014 05:41   US Abdomen Complete  08/30/2014   CLINICAL DATA:  Abnormal liver function tests.  Ascites.  EXAM: ULTRASOUND ABDOMEN COMPLETE  COMPARISON:  None.  FINDINGS: Gallbladder: Cholelithiasis. Gallbladder wall thickening measuring 9.1 mm. Negative sonographic Murphy's sign. Hyperechoic foci along the gallbladder wall with ring down artifact as can be seen with adenomyomatosis.  Common bile duct: Diameter: 5.5 mm  Liver: No focal lesion identified. Increased hepatic parenchymal echogenicity. There is hepatomegaly.  IVC: No abnormality visualized.  Pancreas: Visualized portion unremarkable.  Spleen: The spleen is enlarged measuring 720 mL. No focal splenic lesion.  Right Kidney: Length: 7.1 cm. Echogenicity within normal limits. No mass or hydronephrosis visualized.  Left Kidney: Length: 5.8 cm. There is an anechoic left renal mass with increased through transmission measuring 13 x 17 mm most consistent with a cyst. Echogenicity within normal limits. No hydronephrosis visualized.  Abdominal aorta: The proximal abdominal aorta measures 3 cm in AP diameter.  Other findings: There is moderate abdominal ascites.  IMPRESSION: 1. Cholelithiasis. Gallbladder wall thickening which may be secondary to intrinsic gallbladder wall abnormality suggest cholecystitis, but this appearance can be seen in the setting of hepatocellular disease and ascites. 2. Atrophic kidneys. 3. Splenomegaly. 4. The hypodense mass seen in the right hepatic lobe adjacent to the  liver capsule on CT abdomen performed same day is not visualized on the ultrasound. Further evaluation with a CT or MRI of the abdomen is recommended optimized for evaluation of the liver. 5. Proximal abdominal aorta measures 3 cm in AP diameter. Recommend followup by [place] in 3 years. This recommendation follows ACR consensus guidelines: White Paper of the ACR Incidental Findings Committee II on Vascular Findings. Earlyne Iba Radiol 2013; 54:098-119   Electronically Signed   By: Elige Ko   On: 08/30/2014 17:55   Ct Abdomen Pelvis W Contrast  08/30/2014   CLINICAL DATA:  Abdominal tenderness in distension.  Liver lesion  EXAM: CT ABDOMEN AND PELVIS WITH CONTRAST  TECHNIQUE: Multidetector CT imaging of the abdomen and pelvis was performed using the standard protocol following bolus administration of intravenous contrast.  CONTRAST:  80mL OMNIPAQUE IOHEXOL 300 MG/ML  SOLN  COMPARISON:  CT 08/30/2014 at 6:30 a.m.  FINDINGS: Lower chest: Interlobular septal thickening at the lung bases. No pleural fluid. Heart is enlarged. Pericardial fluid.  Hepatobiliary: There is reflux of contrast into the hepatic veins suggesting right heart dysfunction. Moderate volume of fluid surrounding the margin of the liver similar to prior. Slight decrease in the fluid in the pelvis. There is mild  traction of the right hepatic lobe not changed from prior with a small hypodense lesions. Gallbladder is collapsed.  Pancreas: Difficult to evaluate the intra-abdominal organs due to ascites. No gross abnormality pancreas.  Spleen: Spleen is enlarged.  Adrenals/urinary tract: Adrenal is normal. Kidneys are atrophic. Bladder is collapsed. No your ureter abnormality  Stomach/Bowel: Stomach, small bowel, colon are unremarkable. There is progression of the oral contrast from the prior scan into the descending colon rectum.  Vascular/Lymphatic: Abdominal aorta is normal caliber no retroperitoneal periportal lymphadenopathy.  Reproductive: Prostate  gland is normal.  Musculoskeletal: There is diffuse gross bone consistent with renal osteodystrophy.  Other: Anasarca  IMPRESSION: 1. Interval decrease in volume of intraperitoneal free fluid volume paracentesis. Moderate volume of ascites remains. 2. No other significant interval change in short interval follow-up from 6:30 a.m. same day 3. No evidence of bowel obstruction. 4. Splenomegaly 5. Evidence of right heart dysfunction with reflux of contrast into hepatic veins. 6. Interlobular septal thickening at the lung bases consistent with interstitial edema.   Electronically Signed   By: Genevive Bi M.D.   On: 08/30/2014 14:42   US Paracentesis  09/02/2014   INDICATION: Recurrent ascites, shortness of breath, request for paracentesis.  EXAM: ULTRASOUND-GUIDED PARACENTESIS  COMPARISON:  08/30/14.  MEDICATIONS: None.  COMPLICATIONS: None immediate  TECHNIQUE: Informed written consent was obtained from the patient after a discussion of the risks, benefits and alternatives to treatment. A timeout was performed prior to the initiation of the procedure.  Initial ultrasound scanning demonstrates a large amount of ascites within the left lower abdominal quadrant. The left lower abdomen was prepped and draped in the usual sterile fashion. 1% lidocaine was used for local anesthesia. An ultrasound image was saved for documentation purposed. An 8 Fr Safe-T-Centesis catheter was introduced. The paracentesis was performed. The catheter was removed and a dressing was applied. The patient tolerated the procedure well without immediate post procedural complication.  FINDINGS: A total of approximately 1.7 liters of serous/amber colored fluid was removed. Samples were sent to the laboratory as requested by the clinical team.  IMPRESSION: Successful ultrasound-guided paracentesis yielding 1.7 liters of peritoneal fluid.  Read By:  Pattricia Boss PA-C   Electronically Signed   By: Ruel Favors M.D.   On: 09/02/2014 13:39   US  Paracentesis  08/30/2014   INDICATION: Ascites.  EXAM: ULTRASOUND-GUIDED PARACENTESIS  COMPARISON:  None.  MEDICATIONS: 10 cc 1% lidocaine  COMPLICATIONS: None immediate  TECHNIQUE: Informed written consent was obtained from the patient after a discussion of the risks, benefits and alternatives to treatment. A timeout was performed prior to the initiation of the procedure.  Initial ultrasound scanning demonstrates a large amount of ascites within the left lower abdominal quadrant. The left Lower abdomen was prepped and draped in the usual sterile fashion. 1% lidocaine with epinephrine was used for local anesthesia. Under direct ultrasound guidance, a 19 gauge, 7-cm, Yueh catheter was introduced. An ultrasound image was saved for documentation purposed. the paracentesis was performed. The catheter was removed and a dressing was applied. The patient tolerated the procedure well without immediate post procedural complication.  FINDINGS: A total of approximately 1.2 liters of amber fluid was removed. Samples were sent to the laboratory as requested by the clinical team.  IMPRESSION: Successful ultrasound-guided paracentesis yielding 1.2 liters of peritoneal fluid.  Read by Robet Leu [facility]   Electronically Signed   By: Richarda Overlie M.D.   On: 08/30/2014 16:27   Dg Chest [facility] 1 [address]  09/05/2014   CLINICAL DATA:  Heart failure, cirrhosis and ascites. Cough and congestion.  EXAM: PORTABLE CHEST - 1 VIEW  COMPARISON:  08/30/2014  FINDINGS: Stable chronic areas of pulmonary scarring and interstitial prominence. No overt edema or infiltrate is identified. No evidence of pleural fluid or pneumothorax. There is stable moderate cardiomegaly. Stable mediastinal soft tissue prominence consistent with previously depicted chronic lymphadenopathy and granulomatous disease.  IMPRESSION: Stable pulmonary scarring and interstitial lung disease. Stable cardiomegaly and mediastinal lymphadenopathy.   Electronically Signed   By:  Irish Lack M.D.   On: 09/05/2014 08:07    ASSESSMENT AND PLAN   1. 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   - Echo 09/04/2014 EF 25-30%, diffuse hypokinesis, akinesis of anteroseptal myocardium, severely dilated RV with moderately reduced RVEF, severe TR, PA peak pressure  - ?if need CTA of chest to r/o recurrent PE placing strain in venous system. Can consider involving HF team, although with significant RV dysfunction, advanced heart failure therapy is limited. Worsening EF, with wall motion abnormality on echo, ?if need to consider L and R heart cath at some point, although unclear if this would offer him any benefit.   2. Massive ascietes: see #1, seen by GI, ?cirrhosis, no evidence of SBP  3. Chronic abdominal pain:  - hepatosplenomegaly noted on CT, reflux of contrast into hepatic vein suggesting R heart dysfunction  4. ESRD on HD MWF: dialyzed Mon and Tue (today) 5. CAD s/p remote MI in 1990s, no recent sign of angina 6. Liver lesion: pending biopsy today 7. Syncope: per IM 8. Hypertension 9. Chronic atrial fibrillation on aspirin: refused coumadin given h/o significant hematoma - rate controlled. Although return of atrial kick may improve HF symptom, however risk of stroke on ASA alone is too high and pt does not want systemic anticoagulation 10. obstructive sleep apnea 11. history of DVT/PE 12. PVD 13. Chronic cough: per patient has been going on since last yr. Febrile last night with Tmax 101, ?accuracy, no longer febrile this morning.   Ramond Dial PA-C Pager: [phone]  Agree with PA note. Patient getting a liver biopsy. Complex situation makes for mediocre prognosis. Will reassess  tomorrow. Thurmon Fair, MD, [facility] [phone] office [phone] pager

## 2014-09-05 NOTE — Sedation Documentation (Signed)
IVC  pressure 32/19/25 Right heart 29/14/23

## 2014-09-05 NOTE — Progress Notes (Signed)
Daily Rounding Note  09/05/2014, 8:18 AM  LOS: 6 days   SUBJECTIVE:       Getting dialyzed again today to address volume overload.  3.5 liters removed at HD today.  3 liters removed yesterday.    On schedule for TJ liver biopsy/HV pressure measurement this afternoon.  Still some abdominal discomfort, not as bad as before.      OBJECTIVE:         Vital signs in last 24 hours:    Temp:  [98.3 F (36.8 C)-101 F (38.3 C)] 98.7 F (37.1 C) (12/29 0708) Pulse Rate:  [70-108] 92 (12/29 0756) Resp:  [14-28] 19 (12/29 0712) BP: (93-148)/(44-99) 112/81 mmHg (12/29 0756) SpO2:  [93 %-99 %] 95 % (12/29 0708) Weight:  [200 lb 13.4 oz (91.1 kg)-211 lb 10.3 oz (96 kg)] 200 lb 13.4 oz (91.1 kg) (12/29 0708) Last BM Date: 09/03/14 Filed Weights   09/04/14 1815 09/05/14 0531 09/05/14 0708  Weight: 203 lb 14.8 oz (92.5 kg) 206 lb (93.441 kg) 200 lb 13.4 oz (91.1 kg)   General: chronically ill looking.  Somnolent, comfortable   Heart: RRR. Chest: Bibasilar rales.  Dyspneic while laying down.  Abdomen: protuberant, + ascites.  NT.  +BS.   Extremities: bil 2+ LE edema.  Neuro/Psych:  Sleepy, arouseable.  Oriented x 3.   Intake/Output from previous day: 12/28 0701 - 12/29 0700 In: 120 [P.O.:120] Out: 3000   Intake/Output this shift:    Lab Results:  Recent Labs  09/03/14 0805 09/04/14 1519  WBC 2.3* 2.6*  HGB 12.8* 13.0  HCT 36.4* 37.6*  PLT 84* 82*   BMET  Recent Labs  09/03/14 0805 09/04/14 1519 09/05/14 0713  NA 134* 134* 134*  K 5.2* 4.9 4.7  CL 97 97 98  CO2 20 24 24   GLUCOSE 89 83 89  BUN 58* 49* 38*  CREATININE 8.08* 6.94* 5.91*  CALCIUM 8.5 8.6 8.6   LFT  Recent Labs  09/03/14 0805 09/04/14 1519 09/05/14 0713  ALBUMIN 2.6* 2.6* 2.6*   PT/INR No results for input(s): LABPROT, INR in the last 72 hours. Hepatitis Panel No results for input(s): HEPBSAG, HCVAB, HEPAIGM, HEPBIGM in the last 72  hours.  Studies/Results: Dg Chest Port 1 View  09/05/2014   CLINICAL DATA:  Heart failure, cirrhosis and ascites. Cough and congestion.  EXAM: PORTABLE CHEST - 1 VIEW  COMPARISON:  08/30/2014  FINDINGS: Stable chronic areas of pulmonary scarring and interstitial prominence. No overt edema or infiltrate is identified. No evidence of pleural fluid or pneumothorax. There is stable moderate cardiomegaly. Stable mediastinal soft tissue prominence consistent with previously depicted chronic lymphadenopathy and granulomatous disease.  IMPRESSION: Stable pulmonary scarring and interstitial lung disease. Stable cardiomegaly and mediastinal lymphadenopathy.   Electronically Signed   By: Irish Lack M.D.   On: 09/05/2014 08:07    ASSESMENT:   * Ascites. S/p Paracentesis 12/23 x 1.2 liters, 12/26 x 1.7 liters. 240 WBCs on #1 with no WBCs and no growth; 273 WBCs on #2 with rare WBCs (PMNs and Monos, no organisms on stain, final clx pending)   * Chronic abdominal pain, appears to be secondary to ascites. Improved after paracentesis. Began after 03/2014 umbilical herniorrhaphy.   * Hepatosplenomegaly. Ascites raises question of cirrhosis. Transjugular liver bx/hepatic vein wedge pressure measurements set for today. Mitochondrial Ab 0.54: normal range.  ANA negative.   ACE level elevated 08/2013 at 69 (rr: 8-52). Smooth muscle Ab pending.  Coags normal, + thrombocytopenia.   * Right lobe liver lesion. Felt to be hemangioma. Biopsy pending   *  Thrombocytopenia. Dates back to 08/2013.    * ESRD. MWF HD.    *  Biventricular heart failure, acute right sided heart failure.  Daily HD to address volume overload.    PLAN   *  Transjugular liver bx/hepatic vein wedge pressure measurements set for today    Jennye MoccasinSarah Gribbin  09/05/2014, 8:18 AM Pager: [phone]  Agree w/ Ms. Hale DroneGrbbin's note. Await IR study and bx results.  Iva Booparl E. Briyah Wheelwright, MD, Clementeen GrahamFACG

## 2014-09-05 NOTE — Sedation Documentation (Addendum)
Hepatic wedge pressure performed  Pre- 33/26/29  Wedge 32/21/26  Sheath 32/21/27

## 2014-09-05 NOTE — Progress Notes (Signed)
Pt returned from IR. Jugular site bleeding and dressing has come off. Held pressure, replaced dressing. Vitals stable, no complications. No pain.  Will continue to monitor. Trevor [name] D

## 2014-09-05 NOTE — Procedures (Signed)
I was present at this dialysis session, have reviewed the session itself and made  appropriate changes  [name] MD (pgr) [phone]    ([phone] 09/05/2014, 10:42 AM

## 2014-09-05 NOTE — Procedures (Signed)
Interventional Radiology Procedure Note  Procedure: Transjugular hepatic venogram, pressure measurements and liver biopsy.  Complications: None Recommendations: - Bedrest 2 hrs with HOB elevated  Signed,  Sterling Big, MD

## 2014-09-05 NOTE — Progress Notes (Signed)
PROGRESS NOTE    Trevor NORLAND ZOX:[id number] DOB: 07-Aug-1951 DOA: 08/30/2014 PCP: [name], MD  HPI/Brief narrative 63 year old male patient with history of ESRD on HD MWF, chronic systolic CHF, A. 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 He had vigorous coughing spells and passed out. He saw his cardiologist and was advised to come to the hospital. He underwent diagnostic and therapeutic paracentesis by IR, twice. Nephrology consulting for dialysis needs. GI consulted and recommended transjugular liver biopsy which she underwent 12/29. Cardiology consulting for biventricular heart failure.   Assessment/Plan:  1. Massive ascites:? Etiology. Possibly from Cirrhosis related to passive congestion from RHF. Status post 1.2 L and 1.7 L by IR. No SBP. GI suggests cirrhosis with portal hypertension accounting for ascites and splenomegaly. s/p transjugular liver biopsy 12/29-follow results. Follow up ANA & Mitochondrial Ab's. 2. Chronic abdominal pain/hepatosplenomegaly: Unclear etiology. States started after hernia repair but no postoperative complications. Possibly related to ascites-improved after tap.  3. ESRD on MWF HD: Management per nephrology. Nephrology plans to dialyze daily until improvement of volume status. 4. Liver lesion seen on CT but not ultrasound:? MRI of liver. GI input appreciated. 5. Syncope: Possibly cough syncope. Initial troponin minimally elevated which could be secondary to renal failure but subsequent troponins have been negative. No clinical pneumonia. 6. History of chronic A. fib: Rate controlled in the 90s. Warfarin anticoagulation discontinued secondary to spontaneous back hematoma and patient has declined  anticoagulation since. 7. History of essential hypertension: Mildly uncontrolled. Labetalol discontinued to allow blood pressure for dialysis. 8. 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 9. Acute on chronic biventricular failure, predominantly right heart failure: Volume overloaded-management across dialysis. 2-D echo 12/28: Moderate LVH, LVEF 25-30 percent, diffuse hypokinesis. Akinesis of anteroseptal myocardium. PA peak pressure 66 mmHg. Cardiology input appreciated: Continued hemodialysis to avoid acute left heart failure, ? CTA chest to rule out recurrent PE- (discussed with nephrologist and will get CTA chest) and? Left and right heart cath. Overall prognosis appears poor. 10. Leukopenia and thrombocytopenia:? Secondary to liver disease. Follow CBCs. Stable.? Hypersplenism. 11. Chronic cough: States that he has had it for a year and etiology is not clear.   Code Status: Full  Family Communication: None at bedside. Disposition Plan: Home when medically stable   Consultants:  Nephrology  Gastroenterology  Cardiology  Interventional radiology.  Procedures:  Paracentesis 1.2 L by IR on 12/23 and 1.7 L on 12/26  Hemodialysis  Transjugular liver biopsy 12/29  Antibiotics:  None   Subjective: Seen postdialysis. Denies dyspnea although appears mildly tachypneic. Abdominal discomfort but no pain.  Objective: Filed Vitals:   09/05/14 1504 09/05/14 1508 09/05/14 1512 09/05/14 1519  BP: 135/111 141/100 145/100 135/98  Pulse: 100 112 106 112  Temp:      TempSrc:      Resp: 30 30 32 30  Height:      Weight:      SpO2: 93% 94% 90% 98%    Intake/Output Summary (Last 24 hours) at 09/05/14 1616 Last data filed at 09/05/14 1334  Gross per 24 hour  Intake      0 ml  Output   6500 ml  Net  -6500 ml   Filed Weights   09/05/14 0531 09/05/14 0708  09/05/14 1044  Weight: 93.441  kg (206 lb) 91.1 kg (200 lb 13.4 oz) 87.6 kg (193 lb 2 oz)     Exam:  General exam: Moderately built and nourished middle-aged male sitting up in bed with mild respiratory distress. Dilated veins of upper trunk and neck. Respiratory system: Diminished breath sounds in the bases with occasional crackles otherwise clear to auscultation. Mild increased work of breathing. Cardiovascular system: S1 & S2 heard, irregularly irregular. No JVD, murmurs, gallops, clicks. Trace bilateral ankle edema.  Gastrointestinal system: Abdomen is moderately distended(decreased), soft and nontender. Ascites + +. Normal bowel sounds heard. Central nervous system: Alert and oriented. No focal neurological deficits. Extremities: Symmetric 5 x 5 power.   Data Reviewed: Basic Metabolic Panel:  Recent Labs Lab 08/31/14 0620 09/01/14 0555 09/02/14 0552 09/03/14 0805 09/04/14 1519 09/05/14 0713  NA 134* 137  --  134* 134* 134*  K 4.2 3.8  --  5.2* 4.9 4.7  CL 99 100  --  97 97 98  CO2 22 25  --  20 24 24   GLUCOSE 78 109*  --  89 83 89  BUN 40* 31*  --  58* 49* 38*  CREATININE 6.09* 5.37*  --  8.08* 6.94* 5.91*  CALCIUM 8.3* 8.5  --  8.5 8.6 8.6  MG  --   --  2.4  --   --   --   PHOS  --   --   --  6.2* 5.2* 4.5   Liver Function Tests:  Recent Labs Lab 08/30/14 0402 08/31/14 0620 09/01/14 0555 09/03/14 0805 09/04/14 1519 09/05/14 0713  AST 42* 37 41*  --   --   --   ALT 32 28 28  --   --   --   ALKPHOS 228* 225* 225*  --   --   --   BILITOT 2.0* 2.0* 2.3*  --   --   --   PROT 7.1 6.7 6.9  --   --   --   ALBUMIN 2.8* 2.6* 2.7* 2.6* 2.6* 2.6*   No results for input(s): LIPASE, AMYLASE in the last 168 hours. No results for input(s): AMMONIA in the last 168 hours. CBC:  Recent Labs Lab 08/30/14 0402 08/31/14 0620 09/01/14 0555 09/03/14 0805 09/04/14 1519  WBC 2.4* 3.0* 2.8* 2.3* 2.6*  NEUTROABS 1.5*  --   --   --   --   HGB 13.2 13.5 13.0 12.8* 13.0  HCT 38.0* 38.1* 37.1* 36.4* 37.6*    MCV 82.6 80.2 82.4 79.6 81.9  PLT 74* 72* 79* 84* 82*   Cardiac Enzymes:  Recent Labs Lab 08/30/14 1034 08/30/14 1546 08/30/14 2146  TROPONINI 0.04* <0.03 0.03   BNP (last 3 results)  Recent Labs  08/29/14 1421  PROBNP 1830.0*   CBG: No results for input(s): GLUCAP in the last 168 hours.  Recent Results (from the past 240 hour(s))  Body fluid culture     Status: None   Collection Time: 08/30/14  1:27 PM  Result Value Ref Range Status   Specimen Description FLUID ABDOMEN ASCITIC  Final   Special Requests NONE  Final   Gram Stain   Final    NO WBC SEEN NO ORGANISMS SEEN Performed at [facility]    Culture   Final    NO GROWTH 3 DAYS Performed at [facility]    Report Status 09/02/2014 FINAL  Final  AFB culture with smear     Status: None (Preliminary  result)   Collection Time: 08/30/14  1:27 PM  Result Value Ref Range Status   Specimen Description FLUID ABDOMEN ASCITIC  Final   Special Requests NONE  Final   Acid Fast Smear   Final    NO ACID FAST BACILLI SEEN Performed at [facility]    Culture   Final    CULTURE WILL BE EXAMINED FOR 6 WEEKS BEFORE ISSUING A FINAL REPORT Performed at [facility]    Report Status PENDING  Incomplete  Body fluid culture     Status: None   Collection Time: 09/02/14  1:44 PM  Result Value Ref Range Status   Specimen Description PERITONEAL FLUID  Final   Special Requests NONE  Final   Gram Stain   Final    RARE WBC PRESENT,BOTH PMN AND MONONUCLEAR NO ORGANISMS SEEN Performed at [facility]    Culture   Final    NO GROWTH 3 DAYS Performed at [facility]    Report Status 09/05/2014 FINAL  Final          Studies: Dg Chest Port 1 View  09/05/2014   CLINICAL DATA:  Heart failure, cirrhosis and ascites. Cough and congestion.  EXAM: PORTABLE CHEST - 1 VIEW  COMPARISON:  08/30/2014  FINDINGS: Stable chronic areas of pulmonary scarring and interstitial prominence. No  overt edema or infiltrate is identified. No evidence of pleural fluid or pneumothorax. There is stable moderate cardiomegaly. Stable mediastinal soft tissue prominence consistent with previously depicted chronic lymphadenopathy and granulomatous disease.  IMPRESSION: Stable pulmonary scarring and interstitial lung disease. Stable cardiomegaly and mediastinal lymphadenopathy.   Electronically Signed   By: Irish Lack M.D.   On: 09/05/2014 08:07        Scheduled Meds: . antiseptic oral rinse  7 mL Mouth Rinse BID  . aspirin EC  81 mg Oral Daily  . feeding supplement (NEPRO CARB STEADY)  237 mL Oral Daily  . fentaNYL      . lidocaine      . midazolam      . multivitamin  1 tablet Oral QHS  . senna-docusate  2 tablet Oral BID  . sevelamer carbonate  3,200 mg Oral TID WC  . sodium chloride  3 mL Intravenous Q12H  . sodium chloride  3 mL Intravenous Q12H  . triamcinolone ointment  1 application Topical Daily   Continuous Infusions:   Principal Problem:   Ascites Active Problems:   Atrial fibrillation   ESRD on hemodialysis   Cough syncope   Thrombocytopenia   Leukopenia   Abnormal LFTs   Pedal edema   Abdominal pain, chronic, epigastric   Chronic atrial fibrillation   Cirrhosis   RVF (right ventricular failure)    Time spent: 25 minutes.    Marcellus Scott, MD, FACP, FHM. [facility] Pager [phone]  If 7PM-7AM, please contact night-coverage [email or web address] Password [email or web address] 09/05/2014, 4:16 PM    LOS: 6 days

## 2014-09-05 NOTE — Sedation Documentation (Signed)
Sheath pulled and pressure held at right jugular site.

## 2014-09-05 NOTE — Progress Notes (Signed)
[name] Progress Note   Subjective: coughing, dyspneic, not in distress, no new complaints  Filed Vitals:   09/05/14 0825 09/05/14 0859 09/05/14 0930 09/05/14 1000  BP: 123/90 126/98 119/77 133/88  Pulse: 93 68 87 89  Temp:      TempSrc:      Resp:      Height:      Weight:      SpO2:       Exam: Chronically-ill appearing, on HD Mild ^WOB at rest +JVD Chest bibasilar rales RRR no MRG Abd marked pendulant ascites, nontender + BS 2+ bilat diffuse LE edema  ECHO last study April 2015 - LVEF 35-40%, diffuse HK, RV vol overload, pulm HTN peak 54mm Hg, RA/ RV both severely dilated, RV fxn moderately reduced CXR - 12/23 bibasilar airspace change c/w edema vs pna  HD: MWF [name] 4h    F200    500/800    97kg    2/2.0 [place]    Heparin 3200   LUA AVF Trevor [name] 3 ug TIW    Assessment: 1. Ascites / anasarca - new onset, w/u in progress 2. Bivent HF - progressive worsening 3. ESRD on HD MWF 4. Anemia - Hb up ,no ESA 5. 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 P 4.5 6. HTN - holding labetalol, marked vol overload persists 7. Hx PE / LLE DVT Dec 2014 8. Hx afib - not on AC d/t bleeding history  Plan- Holding BP meds, daily HD until vol down as BP will tolerate.  Looks very ill and has declined substantially since last here in July.      Trevor Moselle MD  pager [phone]    cell [phone]  09/05/2014, 10:33 AM     Recent Labs Lab 09/03/14 0805 09/04/14 1519 09/05/14 0713  NA 134* 134* 134*  K 5.2* 4.9 4.7  CL 97 97 98  CO2 20 24 24   GLUCOSE 89 83 89  BUN 58* 49* 38*  CREATININE 8.08* 6.94* 5.91*  CALCIUM 8.5 8.6 8.6  PHOS 6.2* 5.2* 4.5    Recent Labs Lab 08/30/14 0402 08/31/14 0620 09/01/14 0555 09/03/14 0805 09/04/14 1519 09/05/14 0713  AST 42* 37 41*  --   --   --   ALT 32 28 28  --   --   --   ALKPHOS 228* 225* 225*  --   --   --   BILITOT 2.0* 2.0* 2.3*  --   --   --   PROT 7.1 6.7 6.9  --   --   --   ALBUMIN 2.8* 2.6*  2.7* 2.6* 2.6* 2.6*    Recent Labs Lab 08/30/14 0402  09/01/14 0555 09/03/14 0805 09/04/14 1519  WBC 2.4*  < > 2.8* 2.3* 2.6*  NEUTROABS 1.5*  --   --   --   --   HGB 13.2  < > 13.0 12.8* 13.0  HCT 38.0*  < > 37.1* 36.4* 37.6*  MCV 82.6  < > 82.4 79.6 81.9  PLT 74*  < > 79* 84* 82*  < > = values in this interval not displayed. Marland Kitchen antiseptic oral rinse  7 mL Mouth Rinse BID  . aspirin EC  81 mg Oral Daily  . feeding supplement (NEPRO CARB STEADY)  237 mL Oral Daily  . multivitamin  1 tablet Oral QHS  . senna-docusate  2 tablet Oral BID  . sevelamer carbonate  3,200 mg Oral TID WC  . sodium chloride  3 mL Intravenous Q12H  . sodium chloride  3 mL Intravenous Q12H  . triamcinolone ointment  1 application Topical Daily     sodium chloride, sodium chloride, sodium chloride, acetaminophen **OR** acetaminophen, albuterol, alteplase, feeding supplement (NEPRO CARB STEADY), guaiFENesin-codeine, heparin, lidocaine (PF), lidocaine-prilocaine, morphine injection, ondansetron **OR** ondansetron (ZOFRAN) IV, oxyCODONE, pentafluoroprop-tetrafluoroeth, sevelamer carbonate, sevelamer carbonate, sodium chloride

## 2014-09-06 DIAGNOSIS — I272 Other secondary pulmonary hypertension: Secondary | ICD-10-CM

## 2014-09-06 DIAGNOSIS — I420 Dilated cardiomyopathy: Secondary | ICD-10-CM

## 2014-09-06 DIAGNOSIS — R140 Abdominal distension (gaseous): Secondary | ICD-10-CM | POA: Diagnosis present

## 2014-09-06 DIAGNOSIS — I5043 Acute on chronic combined systolic (congestive) and diastolic (congestive) heart failure: Principal | ICD-10-CM

## 2014-09-06 LAB — RENAL FUNCTION PANEL
ALBUMIN: 2.6 g/dL — AB (ref 3.5–5.2)
ANION GAP: 11 (ref 5–15)
BUN: 45 mg/dL — ABNORMAL HIGH (ref 6–23)
CO2: 26 mmol/L (ref 19–32)
Calcium: 8.6 mg/dL (ref 8.4–10.5)
Chloride: 98 mEq/L (ref 96–112)
Creatinine, Ser: 6.3 mg/dL — ABNORMAL HIGH (ref 0.50–1.35)
GFR calc non Af Amer: 9 mL/min — ABNORMAL LOW (ref >90)
GFR, EST AFRICAN AMERICAN: 10 mL/min — AB (ref >90)
Glucose, Bld: 95 mg/dL (ref 70–99)
POTASSIUM: 4.2 mmol/L (ref 3.5–5.1)
Phosphorus: 4.2 mg/dL (ref 2.3–4.6)
Sodium: 135 mmol/L (ref 135–145)

## 2014-09-06 LAB — CBC
HCT: 37.8 % — ABNORMAL LOW (ref 39.0–52.0)
Hemoglobin: 13.2 g/dL (ref 13.0–17.0)
MCH: 27.9 pg (ref 26.0–34.0)
MCHC: 34.9 g/dL (ref 30.0–36.0)
MCV: 79.9 fL (ref 78.0–100.0)
Platelets: 98 10*3/uL — ABNORMAL LOW (ref 150–400)
RBC: 4.73 MIL/uL (ref 4.22–5.81)
RDW: 18.3 % — AB (ref 11.5–15.5)
WBC: 4.6 10*3/uL (ref 4.0–10.5)

## 2014-09-06 MED ORDER — PHENOL 1.4 % MT LIQD
1.0000 | OROMUCOSAL | Status: DC | PRN
  Administered 2014-09-07: 1 via OROMUCOSAL
  Filled 2014-09-06 (×2): qty 177

## 2014-09-06 MED ORDER — HYDROXYZINE HCL 25 MG PO TABS
ORAL_TABLET | ORAL | Status: AC
  Filled 2014-09-06: qty 1

## 2014-09-06 MED ORDER — HYDROXYZINE HCL 25 MG PO TABS
25.0000 mg | ORAL_TABLET | Freq: Four times a day (QID) | ORAL | Status: DC | PRN
  Administered 2014-09-06 – 2014-09-12 (×4): 25 mg via ORAL
  Filled 2014-09-06 (×3): qty 1

## 2014-09-06 NOTE — Progress Notes (Signed)
PROGRESS NOTE    Trevor [name] GBT:[id number] DOB: 08/05/1951 DOA: 08/30/2014 PCP: [name], MD  HPI/Brief narrative 63 year old male patient with history of ESRD on HD MWF, chronic systolic CHF, A. 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  He had dyspnea secondary to abdominal distention. He had vigorous coughing spells and passed out. He saw his cardiologist and was advised to come to the hospital. He underwent diagnostic and therapeutic paracentesis by IR, twice. Nephrology consulting as he is receiving daily HD during this hospitalization. GI consulted and recommended transjugular liver biopsy which was completed on 12/29. Cardiology consulting for biventricular heart failure.  Subjective: Patient reports breathing is improved and abdominal pain is improved.  Assessment/Plan:  Massive ascites:  Multifactorial, possibly from Cirrhosis as well as congestive hepatopathy from RHF. Status post 1.2 L and 1.7 L paracentesis by IR. No SBP. GI suggests cirrhosis with portal hypertension accounting for ascites and splenomegaly. s/p transjugular liver biopsy 12/29-follow results. ANA negative.  Mitochondrial AB WNL.  ASMA  Positive at 46  Acute on chronic biventricular failure, predominantly right heart failure: Volume overloaded-management across dialysis. 2-D echo 12/28: Moderate LVH, LVEF 25-30 percent, diffuse hypokinesis. Akinesis of anteroseptal myocardium. PA peak pressure 66 mmHg. Cardiology input appreciated: Continued hemodialysis to avoid acute left heart failure,  CTA chest negative for recurrent PE.  Cardiology considering heart cath. Overall prognosis appears poor.  Chronic abdominal pain/hepatosplenomegaly: Unclear etiology. States started after hernia repair but no postoperative complications. Possibly related to  ascites-improved after tap.   ESRD on MWF HD: Management per nephrology. Nephrology plans to dialyze daily until improvement of volume status.  Liver lesion seen on CT but not ultrasound:  Will defer management to GI.  TJ biopsy completed path pending.  Syncope: Possibly cough syncope. Initial troponin minimally elevated which could be secondary to renal failure but subsequent troponins have been negative. No clinical pneumonia.  History of chronic A. fib: Rate controlled in the 90s. Warfarin anticoagulation discontinued secondary to spontaneous back hematoma and patient has declined anticoagulation since.  History of essential hypertension: Mildly uncontrolled. Labetalol discontinued to allow blood pressure for dialysis.  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  Leukopenia and thrombocytopenia: Secondary to liver disease. Follow CBCs. Stable.? Hypersplenism.  Chronic cough: States that he has had it for a year and etiology is not clear.  Palliative Medicine:  Patient with failure of 3 organ systems.  Medically he is appropriate for a goals of care conversation.  However, personally the patient does not seem to be inclined towards Palliative medicine at this point.  Will attempt to discuss GOC / DNR  status with him on 12/31. - patient with somewhat poor insight and asking today on whether she should have a tonsillectomy for his chronic cough, also mentions that he wishes he had an EGD and colonoscopy while here. He does not fully comprehend that he has advanced liver/renal and heart disease.   Code Status: Full  Family Communication: None at bedside. Disposition Plan: Home when medically stable   Consultants:  Nephrology  Gastroenterology  Cardiology  Interventional radiology.  Procedures:  Paracentesis 1.2 L by IR on 12/23 and 1.7 L on 12/26  Hemodialysis  Transjugular  liver biopsy 12/29  Antibiotics:  None   Objective: Filed Vitals:   09/05/14 1519 09/05/14 2108  09/06/14 0640 09/06/14 0706  BP: 135/98 107/71 125/82   Pulse: 112 101 91   Temp:  98.7 F (37.1 C) 99.6 F (37.6 C)   TempSrc:  Oral Oral   Resp: 30 24 32   Height:      Weight:    89.9 kg (198 lb 3.1 oz)  SpO2: 98% 91% 98%     Intake/Output Summary (Last 24 hours) at 09/06/14 1257 Last data filed at 09/06/14 1023  Gross per 24 hour  Intake    360 ml  Output      0 ml  Net    360 ml   Filed Weights   09/05/14 0708 09/05/14 1044 09/06/14 0706  Weight: 91.1 kg (200 lb 13.4 oz) 87.6 kg (193 lb 2 oz) 89.9 kg (198 lb 3.1 oz)     Exam: General exam: Moderately built male lying sideways in bed.  Dilated veins of upper trunk and neck.  Respiratory system: Diminished breath sounds in the bases with occasional crackles otherwise clear to auscultation. No increased work of breathing. Cardiovascular system: S1 & S2 heard, irregularly irregular. No JVD, murmurs, gallops, clicks. 2+ pitting edema in LEs. Gastrointestinal system: Abdomen is moderately distended(decreased), soft and nontender. Ascites + +. Normal bowel sounds heard. +Periumbilical hernia. Central nervous system: Alert and oriented. No focal neurological deficits. Skin: no rashes Extremities: Symmetric 5 x 5 power.   Data Reviewed: Basic Metabolic Panel:  Recent Labs Lab 09/01/14 0555 09/02/14 0552 09/03/14 0805 09/04/14 1519 09/05/14 0713 09/05/14 1700  NA 137  --  134* 134* 134* 139  K 3.8  --  5.2* 4.9 4.7 4.4  CL 100  --  97 97 98 100  CO2 25  --  20 24 24 26   GLUCOSE 109*  --  89 83 89 96  BUN 31*  --  58* 49* 38* 27*  CREATININE 5.37*  --  8.08* 6.94* 5.91* 4.89*  CALCIUM 8.5  --  8.5 8.6 8.6 8.8  MG  --  2.4  --   --   --   --   PHOS  --   --  6.2* 5.2* 4.5  --    Liver Function Tests:  Recent Labs Lab 08/31/14 0620 09/01/14 0555 09/03/14 0805 09/04/14 1519 09/05/14 0713 09/05/14 1700  AST  37 41*  --   --   --  50*  ALT 28 28  --   --   --  26  ALKPHOS 225* 225*  --   --   --  220*  BILITOT 2.0* 2.3*  --   --   --  2.7*  PROT 6.7 6.9  --   --   --  7.0  ALBUMIN 2.6* 2.7* 2.6* 2.6* 2.6* 2.6*   CBC:  Recent Labs Lab 08/31/14 0620 09/01/14 0555 09/03/14 0805 09/04/14 1519 09/05/14 1700  WBC 3.0* 2.8* 2.3* 2.6* 4.7  HGB 13.5 13.0 12.8* 13.0 14.6  HCT 38.1* 37.1* 36.4* 37.6* 42.2  MCV 80.2 82.4 79.6 81.9 82.9  PLT 72* 79* 84* 82* 97*   Cardiac Enzymes:  Recent Labs Lab 08/30/14 1546 08/30/14 2146  TROPONINI <0.03 0.03   BNP (last 3 results)  Recent Labs  08/29/14 1421  PROBNP 1830.0*    Recent Results (from the past 240 hour(s))  Body fluid culture     Status: None   Collection Time: 08/30/14  1:27 PM  Result Value Ref Range Status   Specimen Description FLUID ABDOMEN ASCITIC  Final  Special Requests NONE  Final   Gram Stain   Final    NO WBC SEEN NO ORGANISMS SEEN Performed at [facility]    Culture   Final    NO GROWTH 3 DAYS Performed at [facility]    Report Status 09/02/2014 FINAL  Final  AFB culture with smear     Status: None (Preliminary result)   Collection Time: 08/30/14  1:27 PM  Result Value Ref Range Status   Specimen Description FLUID ABDOMEN ASCITIC  Final   Special Requests NONE  Final   Acid Fast Smear   Final    NO ACID FAST BACILLI SEEN Performed at [facility]    Culture   Final    CULTURE WILL BE EXAMINED FOR 6 WEEKS BEFORE ISSUING A FINAL REPORT Performed at [facility]    Report Status PENDING  Incomplete  Body fluid culture     Status: None   Collection Time: 09/02/14  1:44 PM  Result Value Ref Range Status   Specimen Description PERITONEAL FLUID  Final   Special Requests NONE  Final   Gram Stain   Final    RARE WBC PRESENT,BOTH PMN AND MONONUCLEAR NO ORGANISMS SEEN Performed at [facility]    Culture   Final    NO GROWTH 3 DAYS Performed at [facility]    Report Status 09/05/2014 FINAL  Final    Studies: Ct Angio Chest Pe W/cm &/or Wo Cm 09/05/2014 1. Cardiomegaly with pulmonary vascular congestion and slight interstitial edema consistent with congestive heart failure. 2. No acute pulmonary emboli. One tiny linear old embolus in a branch of the right pulmonary artery to the middle lobe. 3. Ascites.  Hepatomegaly.   Ir Venogram Hepatic W Hemodynamic Evaluation 09/05/2014 1. Patent right hepatic vein.  No evidence of Budd-Chiari. 2. Markedly elevated right heart and free hepatic venous pressures likely secondary to right heart failure. 3. Negative for evidence of portal hypertension. The portosystemic gradient ranged from 3- 8 mmHg which is within normal limits. 4. Successful transjugular random liver biopsy.   Ir Transcatheter Bx 09/05/2014 1. Patent right hepatic vein.  No evidence of Budd-Chiari. 2. Markedly elevated right heart and free hepatic venous pressures likely secondary to right heart failure. 3. Negative for evidence of portal hypertension. The portosystemic gradient ranged from 3- 8 mmHg which is within normal limits. 4. Successful transjugular random liver biopsy. Signed,  Sterling BigHeath K. McCullough, MD  [facility]  [facility]   Electronically Signed   By: Malachy MoanHeath  McCullough M.D.   On: 09/05/2014 17:24   Ir [place] Guide Vasc Access Right 09/05/2014 1. Patent right hepatic vein.  No evidence of Budd-Chiari. 2. Markedly elevated right heart and free hepatic venous pressures likely secondary to right heart failure. 3. Negative for evidence of portal hypertension. The portosystemic gradient ranged from 3- 8 mmHg which is within normal limits. 4. Successful transjugular random liver biopsy.   Dg Chest Port 1 View 09/05/2014 Stable pulmonary scarring and interstitial lung disease. Stable cardiomegaly and mediastinal lymphadenopathy.  Scheduled Meds: . antiseptic oral rinse  7 mL Mouth Rinse BID    . aspirin EC  81 mg Oral Daily  . feeding supplement (NEPRO CARB STEADY)  237 mL Oral Daily  . multivitamin  1 tablet Oral QHS  . senna-docusate  2 tablet Oral BID  . sevelamer carbonate  3,200 mg Oral TID WC  . sodium chloride  3 mL Intravenous  Q12H  . sodium chloride  3 mL Intravenous Q12H  . 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  I have directly reviewed the clinical findings, lab, imaging studies and management of this patient in detail. I have made the necessary changes to the above noted documentation, and agree with the documentation, as recorded by the Advance Practice Provider.   Pamella Pert, MD [facility] [phone]

## 2014-09-06 NOTE — Procedures (Signed)
I was present at this dialysis session, have reviewed the session itself and made  appropriate changes  [name] MD (pgr) [phone]    ([phone] 09/06/2014, 2:10 PM

## 2014-09-06 NOTE — Progress Notes (Addendum)
     [facility] Progress Note  Subjective:   Pt was dialyzed yesterday with 3.5 liters removed, and 3 liters removed the previous day. Pt for dialysis later today. Says abd is more comfortable.s/p TJ liver biopsy--path pending.   Objective:  Vital signs in last 24 hours: Temp:  [98.2 F (36.8 C)-99.6 F (37.6 C)] 99.6 F (37.6 C) (12/30 0640) Pulse Rate:  [85-118] 91 (12/30 0640) Resp:  [22-40] 32 (12/30 0640) BP: (98-145)/(31-111) 125/82 mmHg (12/30 0640) SpO2:  [90 %-98 %] 98 % (12/30 0640) Weight:  [193 lb 2 oz (87.6 kg)-198 lb 3.1 oz (89.9 kg)] 198 lb 3.1 oz (89.9 kg) (12/30 0706) Last BM Date: 09/06/14 General:   Alert, in good spirits, says he is comfortable Heart:  Regular rate and rhythm; no murmurs Pulm bibasilar rales Abdomen:  Protuberant, + ascites Normal bowel sounds, without guarding, and without rebound.   Extremities:  Without edema. Neurologic:  Alert and  oriented x4;  grossly normal neurologically. Psych:  Alert and cooperative. Normal mood and affect.   ASSESSMENT/PLAN:   Ascites-s/p paracentesis 12/23 x 1.2 liters, 12/26 x 1.7 liters. 240 WBCs on #1 with no wbcs and no growth; 273 wbcs on #2 with rare wbc.  -chronic abd pain--much improved after paracentesis  -hepatosplenomegaly. Transjugular bx/ hepatic vein wedge pressure measurement done yesterday.Noted to have elevated right heart and free hepatic venous pressures likely due to right heart failure.No evidence of portal hypertension. Liver bx results pending.  -Right lobe liver lesion. Felt to be hemangioma, Biopsy pending.  -thrombocytopenia. Dates back to 08/2013.  -ESRD. MWF HD.  -biventricular heart failure, acute right sided heart failure. Daily HD to address volume overload.    LOS: 7 days   Hvozdovic, Moise Boring 09/06/2014, Pager [phone]  Agree w/ Ms. Hvozdovic's note and mangement.  He has a + anti-smooth mm Ab - will see what bx of liver shows but looks like RHF is  main problem and it is going to be extremely difficult to treat his fluid overload.   Iva Boop, MD, [facility] [phone] (pager) 09/06/2014 4:13 PM

## 2014-09-06 NOTE — Progress Notes (Signed)
Subjective:  He says his SOB is better  Objective:  Vital Signs in the last 24 hours: Temp:  [98.7 F (37.1 C)-99.6 F (37.6 C)] 99.6 F (37.6 C) (12/30 0640) Pulse Rate:  [85-118] 91 (12/30 0640) Resp:  [24-40] 32 (12/30 0640) BP: (107-145)/(71-111) 125/82 mmHg (12/30 0640) SpO2:  [90 %-98 %] 98 % (12/30 0640) Weight:  [198 lb 3.1 oz (89.9 kg)] 198 lb 3.1 oz (89.9 kg) (12/30 0706)  Intake/Output from previous day:  Intake/Output Summary (Last 24 hours) at 09/06/14 1211 Last data filed at 09/06/14 1023  Gross per 24 hour  Intake    360 ml  Output      0 ml  Net    360 ml    Physical Exam: General appearance: alert, cooperative, cachectic, mild distress and mild dyspnea at rest Lungs: few basilar rales Heart: irregularly irregular rhythm   Rate: 92  Rhythm: Pt is not on telemetry  Lab Results:  Recent Labs  09/04/14 1519 09/05/14 1700  WBC 2.6* 4.7  HGB 13.0 14.6  PLT 82* 97*    Recent Labs  09/05/14 0713 09/05/14 1700  NA 134* 139  K 4.7 4.4  CL 98 100  CO2 24 26  GLUCOSE 89 96  BUN 38* 27*  CREATININE 5.91* 4.89*   No results for input(s): TROPONINI in the last 72 hours.  Invalid input(s): CK, MB  Recent Labs  09/05/14 1030  INR 1.30    Imaging: Imaging results have been reviewed  Cardiac Studies: Echo 09/04/14 Study Conclusions  - Left ventricle: The cavity size was moderately dilated. Wall thickness was increased in a pattern of moderate LVH. Systolic function was severely reduced. The estimated ejection fraction was in the range of 25% to 30%. Diffuse hypokinesis. Akinesis of the anteroseptal myocardium. - Ventricular septum: The contour showed diastolic flattening. - Aortic valve: There was mild regurgitation. - Mitral valve: There was mild regurgitation. - Left atrium: The atrium was severely dilated. - Right ventricle: The cavity size was severely dilated. Systolic function was mildly to moderately reduced. -  Right atrium: The atrium was massively dilated. - Tricuspid valve: There was severe regurgitation. - Pulmonary arteries: Systolic pressure was moderately to severely increased. PA peak pressure: 66 mm Hg  Assessment/Plan:  63 year old African-American male with history of hypertension, ESRD on HD M/W/F, chronic atrial fibrillation on aspirin, history of syncope, obstructive sleep apnea, history of DVT/PE, PVD, and CAD status post remote MI in 1990s. Pt is followed by Dr Mayford Knifeurner as an OP. He was admitted 08/30/14 with abdominal pain and ascites. He was felt to have acute on chronic mixed CHF. He has declined Coumadin Rx (prior DVT, AF) after he had a hematoma on Coumadin in the past.    Principal Problem:   Acute on chronic combined systolic and diastolic heart failure Active Problems:   RVF (right ventricular failure)   ESRD on hemodialysis   DCM- EF 20-25% by echo 09/04/14   Ascites   Chronic atrial fibrillation   Cirrhosis   Obstructive sleep apnea   Essential hypertension   Fever of undetermined origin   PLAN: Per Dr Alden Hipproitoru  [facility] PA-C Beeper [phone] 09/06/2014, 12:11 PM   I have seen and examined the patient along with Corine ShelterLuke Kilroy PA-C, PA .  I have reviewed the chart, notes and new data.  I agree with PA's note.  PLAN: Very limited therapeutic options due to advanced simultaneous cardiac, renal and liver disease with moderate to severe  pulmonary HTN. Palliative care may be best option. I am not sure he yet understands the seriousness of his illness.  Thurmon Fair, MD, [facility] [phone] 09/06/2014, 2:58 PM

## 2014-09-06 NOTE — Progress Notes (Signed)
Subjective:  Breathing somewhat better/ Hd later today  Objective Vital signs in last 24 hours: Filed Vitals:   09/05/14 1519 09/05/14 2108 09/06/14 0640 09/06/14 0706  BP: 135/98 107/71 125/82   Pulse: 112 101 91   Temp:  98.7 F (37.1 C) 99.6 F (37.6 C)   TempSrc:  Oral Oral   Resp: 30 24 32   Height:      Weight:    89.9 kg (198 lb 3.1 oz)  SpO2: 98% 91% 98%    Weight change: -4.9 kg (-10 lb 12.9 oz)  Physical Exam: General:Alert/ NAD , appropriate Heart: Occas irreg   Rate 80s'/ no rub, mur, or gallop Lungs: Bi basilar rales and faint Rhonchi  Abdomen: BS pos./ Ascites ?nontender ExtremitiesBilat 1+ pedal edema   Dialysis Access: pos bruit  LUA AVF   OP  HD: MWF [name] 4h F200 500/800 97kg 2/2.0 [place] Heparin 3200 LUA AVF [name] 3 ug TIW  Problem/Plan: 1. Ascites / anasarca - new onset, w/u in progress with Gi/Card following/daily hd and had bx done 2. Bivent HF - progressive worsening 3. ESRD on HD MWF, in hosp daily hd for volume removal 4. Anemia - Hb 14.6,no ESA 5. 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 P 4.5/ Corrected Ca 9.2/ hect  mwf hd continue 6. HTN -  Am bp 125/82 and holding labetalol, marked vol overload persists with wt down about 10 kg from admit =100.4 kg  / today pre hd 89.9 kg/ continue daily uf on hd 7. Hx PE / LLE DVT Dec 2014 8. Hx afib - not on AC d/t bleeding history 9. Hyponatremia - resolving with UF  Lenny Pastel, PA-C [facility] Beeper [phone] 09/06/2014,10:52 AM  LOS: 7 days   Pt seen, examined and agree w A/P as above. Marked vol overload and dyspnea starting to improve with volume removal.  Continue daily HD, has dropped 10kg from admit and now signs of vol depletion yet.   [name] MD pager [phone]    cell [phone] 09/06/2014, 1:30 PM    Labs: Basic Metabolic Panel:  Recent Labs Lab 09/03/14 0805 09/04/14 1519 09/05/14 0713 09/05/14 1700  NA 134* 134* 134*  139  K 5.2* 4.9 4.7 4.4  CL 97 97 98 100  CO2 20 24 24 26   GLUCOSE 89 83 89 96  BUN 58* 49* 38* 27*  CREATININE 8.08* 6.94* 5.91* 4.89*  CALCIUM 8.5 8.6 8.6 8.8  PHOS 6.2* 5.2* 4.5  --    Liver Function Tests: CBC:  Recent Labs Lab 08/31/14 0620 09/01/14 0555 09/03/14 0805 09/04/14 1519 09/05/14 1700  WBC 3.0* 2.8* 2.3* 2.6* 4.7  HGB 13.5 13.0 12.8* 13.0 14.6  HCT 38.1* 37.1* 36.4* 37.6* 42.2  MCV 80.2 82.4 79.6 81.9 82.9  PLT 72* 79* 84* 82* 97*   Cardiac Enzymes:  Recent Labs Lab 08/30/14 1546 08/30/14 2146  TROPONINI <0.03 0.03   Medications:   . antiseptic oral rinse  7 mL Mouth Rinse BID  . aspirin EC  81 mg Oral Daily  . feeding supplement (NEPRO CARB STEADY)  237 mL Oral Daily  . multivitamin  1 tablet Oral QHS  . senna-docusate  2 tablet Oral BID  . sevelamer carbonate  3,200 mg Oral TID WC  . sodium chloride  3 mL Intravenous Q12H  . sodium chloride  3 mL Intravenous Q12H  . triamcinolone ointment  1 application Topical Daily

## 2014-09-07 ENCOUNTER — Inpatient Hospital Stay (HOSPITAL_COMMUNITY): Payer: Medicare Other

## 2014-09-07 DIAGNOSIS — Z992 Dependence on renal dialysis: Secondary | ICD-10-CM | POA: Diagnosis not present

## 2014-09-07 DIAGNOSIS — N186 End stage renal disease: Secondary | ICD-10-CM | POA: Diagnosis not present

## 2014-09-07 LAB — COMPREHENSIVE METABOLIC PANEL
ALBUMIN: 2.7 g/dL — AB (ref 3.5–5.2)
ALT: 30 U/L (ref 0–53)
AST: 54 U/L — ABNORMAL HIGH (ref 0–37)
Alkaline Phosphatase: 218 U/L — ABNORMAL HIGH (ref 39–117)
Anion gap: 13 (ref 5–15)
BUN: 32 mg/dL — ABNORMAL HIGH (ref 6–23)
CALCIUM: 8.6 mg/dL (ref 8.4–10.5)
CO2: 28 mmol/L (ref 19–32)
Chloride: 97 mEq/L (ref 96–112)
Creatinine, Ser: 5.12 mg/dL — ABNORMAL HIGH (ref 0.50–1.35)
GFR calc non Af Amer: 11 mL/min — ABNORMAL LOW (ref >90)
GFR, EST AFRICAN AMERICAN: 13 mL/min — AB (ref >90)
Glucose, Bld: 78 mg/dL (ref 70–99)
Potassium: 4 mmol/L (ref 3.5–5.1)
Sodium: 138 mmol/L (ref 135–145)
TOTAL PROTEIN: 7.2 g/dL (ref 6.0–8.3)
Total Bilirubin: 2.6 mg/dL — ABNORMAL HIGH (ref 0.3–1.2)

## 2014-09-07 LAB — CBC
HCT: 40.2 % (ref 39.0–52.0)
HEMOGLOBIN: 13.8 g/dL (ref 13.0–17.0)
MCH: 28.4 pg (ref 26.0–34.0)
MCHC: 34.3 g/dL (ref 30.0–36.0)
MCV: 82.7 fL (ref 78.0–100.0)
Platelets: 97 10*3/uL — ABNORMAL LOW (ref 150–400)
RBC: 4.86 MIL/uL (ref 4.22–5.81)
RDW: 18.6 % — ABNORMAL HIGH (ref 11.5–15.5)
WBC: 4.3 10*3/uL (ref 4.0–10.5)

## 2014-09-07 MED ORDER — NEPRO/CARBSTEADY PO LIQD
237.0000 mL | ORAL | Status: DC | PRN
  Filled 2014-09-07: qty 237

## 2014-09-07 MED ORDER — HEPARIN SODIUM (PORCINE) 1000 UNIT/ML DIALYSIS: 3200.0000 [IU] | Freq: Once | INTRAMUSCULAR | Status: DC

## 2014-09-07 MED ORDER — HEPARIN SODIUM (PORCINE) 1000 UNIT/ML DIALYSIS: 1000.0000 [IU] | INTRAMUSCULAR | Status: DC | PRN

## 2014-09-07 MED ORDER — MAGNESIUM CITRATE PO SOLN
1.0000 | Freq: Once | ORAL | Status: AC
  Administered 2014-09-07: 1 via ORAL
  Filled 2014-09-07: qty 296

## 2014-09-07 MED ORDER — PENTAFLUOROPROP-TETRAFLUOROETH EX AERO: 1.0000 "application " | INHALATION_SPRAY | CUTANEOUS | Status: DC | PRN

## 2014-09-07 MED ORDER — LIDOCAINE HCL (PF) 1 % IJ SOLN: 5.0000 mL | INTRAMUSCULAR | Status: DC | PRN

## 2014-09-07 MED ORDER — GUAIFENESIN-CODEINE 100-10 MG/5ML PO SOLN
ORAL | Status: AC
  Filled 2014-09-07: qty 5

## 2014-09-07 MED ORDER — ALTEPLASE 2 MG IJ SOLR
2.0000 mg | Freq: Once | INTRAMUSCULAR | Status: DC | PRN
  Filled 2014-09-07: qty 2

## 2014-09-07 MED ORDER — LIDOCAINE-PRILOCAINE 2.5-2.5 % EX CREA
1.0000 "application " | TOPICAL_CREAM | CUTANEOUS | Status: DC | PRN
  Filled 2014-09-07: qty 5

## 2014-09-07 MED ORDER — SODIUM CHLORIDE 0.9 % IV SOLN: 100.0000 mL | INTRAVENOUS | Status: DC | PRN

## 2014-09-07 MED ORDER — OXYCODONE HCL 5 MG PO TABS
ORAL_TABLET | ORAL | Status: AC
  Filled 2014-09-07: qty 1

## 2014-09-07 NOTE — Progress Notes (Addendum)
Subjective:  Coughing is worse today. Total I/O neg 14 L since admit  Objective Vital signs in last 24 hours: Filed Vitals:   09/07/14 0531 09/07/14 0805 09/07/14 0810 09/07/14 0830  BP: 128/94 99/78 110/81 110/81  Pulse: 104 103 113 113  Temp: 98.5 F (36.9 C) 98.6 F (37 C)    TempSrc: Oral Oral    Resp: 20 22    Height:      Weight: 91 kg (200 lb 9.9 oz) 90.9 kg (200 lb 6.4 oz)    SpO2: 99% 99%     Weight change: -1.2 kg (-2 lb 10.3 oz)  Physical Exam: General:Alert/ NAD , appropriate;, coughing Heart: Occas irreg, no rub or gallop Lungs: bilat basilar rales again, was better yest Abdomen: ascites, +BS, nontender Ext: bilat 1+ pretib edema Access: pos bruit  LUA AVF  CXR 12/23 +bilat pulm edema CXR 12/29 stable CM and IS edema/ vasc congestion pattern CT chest 12/29 - CM, vasc congestion and IS edema ECHO 12/28 LVEF 25-30%, severely reduced RV function  OP  HD: MWF [name] 4h 45min F200 500/800 97kg 2/2.0 [place] Heparin 3200 LUA AVF [name] 3 ug TIW  Assessment: 1. DOE/ cough/ pulm edema/ vol overload / known bivent CHF w worsening by echo 12/28 - still symptomatic, wt down 10kg from admit and 7kg under old dry wt 2. Ascites - due to HF vs other, s/p liver bx w results pending 3. ESRD on HD MWF, in hosp daily hd for volume removal 4. Anemia - Hb 14.6,no ESA 5. 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 P 4.5/ Corrected Ca 9.2/ hect 3mcg  mwf hd continue 6. HTN -  Am bp 125/82 and holding labetalol 7. Hx PE / LLE DVT Dec 2014 8. Hx afib - not on AC d/t bleeding history 9. Hyponatremia - better w vol off  Plan - HD again today, repeat CXR; still sounds wet and prob needs further IP management   Vinson Moselleob Jillayne Witte MD pager [phone]    cell [phone] 09/07/2014, 8:56 AM    Labs: Basic Metabolic Panel:  Recent Labs Lab 09/04/14 1519 09/05/14 0713 09/05/14 1700 09/06/14 1505 09/07/14 0527  NA 134* 134* 139 135 138  K 4.9 4.7 4.4 4.2 4.0  CL  97 98 100 98 97  CO2 24 24 26 26 28   GLUCOSE 83 89 96 95 78  BUN 49* 38* 27* 45* 32*  CREATININE 6.94* 5.91* 4.89* 6.30* 5.12*  CALCIUM 8.6 8.6 8.8 8.6 8.6  PHOS 5.2* 4.5  --  4.2  --    Liver Function Tests: CBC:  Recent Labs Lab 09/03/14 0805 09/04/14 1519 09/05/14 1700 09/06/14 1505 09/07/14 0527  WBC 2.3* 2.6* 4.7 4.6 4.3  HGB 12.8* 13.0 14.6 13.2 13.8  HCT 36.4* 37.6* 42.2 37.8* 40.2  MCV 79.6 81.9 82.9 79.9 82.7  PLT 84* 82* 97* 98* 97*   Cardiac Enzymes: No results for input(s): CKTOTAL, CKMB, CKMBINDEX, TROPONINI in the last 168 hours. Medications:   . antiseptic oral rinse  7 mL Mouth Rinse BID  . aspirin EC  81 mg Oral Daily  . feeding supplement (NEPRO CARB STEADY)  237 mL Oral Daily  . guaiFENesin-codeine      . multivitamin  1 tablet Oral QHS  . oxyCODONE      . senna-docusate  2 tablet Oral BID  . sevelamer carbonate  3,200 mg Oral TID WC  . sodium chloride  3 mL Intravenous Q12H  . sodium chloride  3 mL Intravenous Q12H  . triamcinolone ointment  1 application Topical Daily

## 2014-09-07 NOTE — Progress Notes (Signed)
PROGRESS NOTE    DACOTAH CABELLO WUJ:[id number] DOB: 11-Apr-1951 DOA: 08/30/2014 PCP: [name], MD  HPI/Brief narrative 63 year old male patient with history of ESRD on HD MWF, chronic systolic CHF, A. 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  He had dyspnea secondary to abdominal distention. He had vigorous coughing spells and passed out. He saw his cardiologist and was advised to come to the hospital. He underwent diagnostic and therapeutic paracentesis by IR, twice. Nephrology consulting as he is receiving daily HD during this hospitalization. GI consulted and recommended transjugular liver biopsy which was completed on 12/29. Cardiology consulting for biventricular heart failure.  Subjective: Patient reports breathing is improved and abdominal pain is improved.  Assessment/Plan:  Massive ascites with new diagnosis of Cirrhosis:  Ascites improved.   Multifactorial, possibly from Cirrhosis as well as congestive hepatopathy from RHF. Status post 1.2 L and 1.7 L paracentesis by IR. No SBP. Receiving daily hemodialysis to control the fluid.  GI suggests cirrhosis with portal hypertension accounting for ascites and splenomegaly. s/p transjugular liver biopsy 12/29-results indicate cirrhosis - possibly of primary biliary source.    Acute on chronic biventricular failure, predominantly right heart failure: Volume overloaded-management across dialysis. 2-D echo 12/28: Moderate LVH, LVEF 25-30 percent, diffuse hypokinesis. Akinesis of anteroseptal myocardium. PA peak pressure 66 mmHg. Cardiology input appreciated: Continued hemodialysis to avoid acute left heart failure,  CTA chest negative for recurrent PE.  Cardiology suggests Palliative care may be the best option.  Overall prognosis appears poor.  Chronic cough with odynaphagia:   Patient indicates his pain is in his throat.  Will talk with ENT about possible consultation vs outpatient follow up.  ESRD on MWF HD: Management per nephrology. Nephrology plans to dialyze daily until improvement of volume status.  Liver lesion seen on CT but not ultrasound:  Will defer management to GI.    Syncope: Possibly cough syncope. Initial troponin minimally elevated which could be secondary to renal failure but subsequent troponins have been negative. No clinical pneumonia.  History of chronic A. fib: Rate controlled in the 90s. Warfarin anticoagulation discontinued secondary to spontaneous back hematoma and patient has declined anticoagulation since.  History of essential hypertension: Mildly uncontrolled. Labetalol discontinued to allow blood pressure for dialysis.  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  Leukopenia and thrombocytopenia: Secondary to liver disease. Follow CBCs. Stable.? Hypersplenism.  Palliative Medicine:  Patient with failure of 3 organ systems.   Discussed code status again on 12/31.  Despite understanding that his heart, liver and kidneys are not working well the patient confirms "full code".   Code Status: Full  Family Communication: None at bedside. Disposition Plan: Home when medically stable   Consultants:  Nephrology  Gastroenterology  Cardiology  Interventional radiology.  Procedures:  Paracentesis 1.2 L by IR on 12/23 and 1.7 L on 12/26  Hemodialysis  Transjugular liver biopsy 12/29  Antibiotics:  None   Objective: Filed Vitals:   09/07/14 1100 09/07/14 1127 09/07/14 1212 09/07/14 1308  BP: 107/45 156/96 110/87 137/99  Pulse: 113 69 99 128  Temp:    98.9 F (37.2 C)  TempSrc:   Oral Oral  Resp:   18 22  Height:      Weight:   85.1 kg (187 lb 9.8 oz)   SpO2:    96%  Intake/Output Summary (Last 24 hours) at 09/07/14  1457 Last data filed at 09/07/14 1212  Gross per 24 hour  Intake      0 ml  Output   6501 ml  Net  -6501 ml   Filed Weights   09/07/14 0531 09/07/14 0805 09/07/14 1212  Weight: 91 kg (200 lb 9.9 oz) 90.9 kg (200 lb 6.4 oz) 85.1 kg (187 lb 9.8 oz)   Exam: General exam: Poorly nourished, moderately built male lying comfortably in bed.  Dilated veins of upper trunk and neck.  Respiratory system: Breathing is stilted.  Expiratory breath sounds are course. Cardiovascular system: S1 & S2 heard, irregularly irregular. No JVD, murmurs, gallops, clicks. Gastrointestinal system: Abdomen is moderately distended(decreased), soft and nontender.  Normal bowel sounds heard. +Periumbilical hernia. Central nervous system: Alert and oriented. No focal neurological deficits. Skin: no rashes Extremities: Symmetric 5 x 5 power.  Data Reviewed: Basic Metabolic Panel:  Recent Labs Lab 09/02/14 0552 09/03/14 0805 09/04/14 1519 09/05/14 0713 09/05/14 1700 09/06/14 1505 09/07/14 0527  NA  --  134* 134* 134* 139 135 138  K  --  5.2* 4.9 4.7 4.4 4.2 4.0  CL  --  97 97 98 100 98 97  CO2  --  20 24 24 26 26 28   GLUCOSE  --  89 83 89 96 95 78  BUN  --  58* 49* 38* 27* 45* 32*  CREATININE  --  8.08* 6.94* 5.91* 4.89* 6.30* 5.12*  CALCIUM  --  8.5 8.6 8.6 8.8 8.6 8.6  MG 2.4  --   --   --   --   --   --   PHOS  --  6.2* 5.2* 4.5  --  4.2  --    Liver Function Tests:  Recent Labs Lab 09/01/14 0555  09/04/14 1519 09/05/14 0713 09/05/14 1700 09/06/14 1505 09/07/14 0527  AST 41*  --   --   --  50*  --  54*  ALT 28  --   --   --  26  --  30  ALKPHOS 225*  --   --   --  220*  --  218*  BILITOT 2.3*  --   --   --  2.7*  --  2.6*  PROT 6.9  --   --   --  7.0  --  7.2  ALBUMIN 2.7*  < > 2.6* 2.6* 2.6* 2.6* 2.7*  < > = values in this interval not displayed. CBC:  Recent Labs Lab 09/03/14 0805 09/04/14 1519 09/05/14 1700 09/06/14 1505 09/07/14 0527  WBC 2.3* 2.6* 4.7 4.6 4.3  HGB 12.8* 13.0  14.6 13.2 13.8  HCT 36.4* 37.6* 42.2 37.8* 40.2  MCV 79.6 81.9 82.9 79.9 82.7  PLT 84* 82* 97* 98* 97*   BNP (last 3 results)  Recent Labs  08/29/14 1421  PROBNP 1830.0*    Recent Results (from the past 240 hour(s))  Body fluid culture     Status: None   Collection Time: 08/30/14  1:27 PM  Result Value Ref Range Status   Specimen Description FLUID ABDOMEN ASCITIC  Final   Special Requests NONE  Final   Gram Stain   Final    NO WBC SEEN NO ORGANISMS SEEN [facility]formed at [facility]    Culture   Final    NO GROWTH 3 DAYS Performed at [facility]    Report Status 09/02/2014 FINAL  Final  AFB culture with smear  Status: None (Preliminary result)   Collection Time: 08/30/14  1:27 PM  Result Value Ref Range Status   Specimen Description FLUID ABDOMEN ASCITIC  Final   Special Requests NONE  Final   Acid Fast Smear   Final    NO ACID FAST BACILLI SEEN Performed at [facility]    Culture   Final    CULTURE WILL BE EXAMINED FOR 6 WEEKS BEFORE ISSUING A FINAL REPORT Performed at [facility]    Report Status PENDING  Incomplete  Body fluid culture     Status: None   Collection Time: 09/02/14  1:44 PM  Result Value Ref Range Status   Specimen Description PERITONEAL FLUID  Final   Special Requests NONE  Final   Gram Stain   Final    RARE WBC PRESENT,BOTH PMN AND MONONUCLEAR NO ORGANISMS SEEN Performed at [facility]    Culture   Final    NO GROWTH 3 DAYS Performed at [facility]    Report Status 09/05/2014 FINAL  Final    Studies: Ct Angio Chest Pe W/cm &/or Wo Cm 09/05/2014 1. Cardiomegaly with pulmonary vascular congestion and slight interstitial edema consistent with congestive heart failure. 2. No acute pulmonary emboli. One tiny linear old embolus in a branch of the right pulmonary artery to the middle lobe. 3. Ascites.  Hepatomegaly.   Ir Venogram Hepatic W Hemodynamic Evaluation 09/05/2014 1. Patent right  hepatic vein.  No evidence of Budd-Chiari. 2. Markedly elevated right heart and free hepatic venous pressures likely secondary to right heart failure. 3. Negative for evidence of portal hypertension. The portosystemic gradient ranged from 3- 8 mmHg which is within normal limits. 4. Successful transjugular random liver biopsy.   Ir Transcatheter Bx 09/05/2014 1. Patent right hepatic vein.  No evidence of Budd-Chiari. 2. Markedly elevated right heart and free hepatic venous pressures likely secondary to right heart failure. 3. Negative for evidence of portal hypertension. The portosystemic gradient ranged from 3- 8 mmHg which is within normal limits. 4. Successful transjugular random liver biopsy. Signed,  Sterling Big, MD  [facility]  [facility]   Electronically Signed   By: Malachy Moan M.D.   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 There is significant biliary duct proliferation. There is diffuse severe hepatic degeneration and abundant Mallory hyaline deposition.  It is difficult to assign an etiology in this case, due to the significant findings of cirrhosis. However, the significant biliary proliferation raises the possibility of a primary biliary disorder  Ir US Guide Vasc Access Right 09/05/2014 1. Patent right hepatic vein.  No evidence of Budd-Chiari. 2. Markedly elevated right heart and free hepatic venous pressures likely secondary to right heart failure. 3. Negative for evidence of portal hypertension. The portosystemic gradient ranged from 3- 8 mmHg which is within normal limits. 4. Successful transjugular random liver biopsy.   Dg Chest Port 1 View 09/05/2014 Stable pulmonary scarring and interstitial lung disease. Stable cardiomegaly and mediastinal lymphadenopathy.  Scheduled Meds: . antiseptic oral rinse   7 mL Mouth Rinse BID  . aspirin EC  81 mg Oral Daily  . feeding supplement (NEPRO CARB STEADY)  237 mL Oral Daily  . guaiFENesin-codeine      . multivitamin  1 tablet Oral QHS  . oxyCODONE      . senna-docusate  2 tablet Oral BID  .  sevelamer carbonate  3,200 mg Oral TID WC  . sodium chloride  3 mL Intravenous Q12H  . sodium chloride  3 mL Intravenous Q12H  . triamcinolone ointment  1 application Topical Daily   Continuous Infusions:   Principal Problem:   Acute on chronic combined systolic and diastolic heart failure Active Problems:   Obstructive sleep apnea   Essential hypertension   ESRD on hemodialysis   Fever of undetermined origin   DCM- EF 20-25% by echo 09/04/14   Ascites   Chronic atrial fibrillation   Cirrhosis   RVF (right ventricular failure)   Abdominal distension   ESRD (end stage renal disease) on dialysis   [place] [facility] Pager [phone]  If 7PM-7AM, please contact night-coverage [email or web address] Password [email or web address] 09/07/2014, 2:57 PM    LOS: 8 days   Patient was seen, examined,treatment plan was discussed with the Advance Practice Provider.  I have directly reviewed the clinical findings, lab, imaging studies and management of this patient in detail. I have made the necessary changes to the above noted documentation, and agree with the documentation, as recorded by the Advance Practice Provider.   Pamella Pert, MD [facility] [phone]

## 2014-09-07 NOTE — Progress Notes (Signed)
[facility] Progress Note  Subjective:   In dialysis. No complaints. Liver bx pending/   Objective:  Vital signs in last 24 hours: Temp:  [98 F (36.7 C)-98.9 F (37.2 C)] 98.6 F (37 C) (12/31 0805) Pulse Rate:  [60-122] 99 (12/31 0958) Resp:  [20-22] 22 (12/31 0805) BP: (95-139)/(56-102) 106/69 mmHg (12/31 0958) SpO2:  [95 %-99 %] 99 % (12/31 0805) Weight:  [199 lb 8.3 oz (90.5 kg)-200 lb 9.9 oz (91 kg)] 200 lb 6.4 oz (90.9 kg) (12/31 0805) Last BM Date: 09/06/14 General:   Alert,  Well-developed, in NAD Heart:  Regular rate and rhythm; no murmurs Pulm;bibasilar rales Abdomen: Protuberant, + ascites Normal bowel sounds, without guarding, and without rebound.   Extremities:  Without edema. Neurologic:  Alert and  oriented x4;  grossly normal neurologically. Psych:  Alert and cooperative. Normal mood and affect.  Intake/Output from previous day: 12/30 0701 - 12/31 0700 In: 360 [P.O.:360] Out: 3000  Intake/Output this shift:    Lab Results:  Recent Labs  09/05/14 1700 09/06/14 1505 09/07/14 0527  WBC 4.7 4.6 4.3  HGB 14.6 13.2 13.8  HCT 42.2 37.8* 40.2  PLT 97* 98* 97*   BMET  Recent Labs  09/05/14 1700 09/06/14 1505 09/07/14 0527  NA 139 135 138  K 4.4 4.2 4.0  CL 100 98 97  CO2 26 26 28   GLUCOSE 96 95 78  BUN 27* 45* 32*  CREATININE 4.89* 6.30* 5.12*  CALCIUM 8.8 8.6 8.6   LFT  Recent Labs  09/07/14 0527  PROT 7.2  ALBUMIN 2.7*  AST PENDING  ALT PENDING  ALKPHOS PENDING  BILITOT PENDING   PT/INR  Recent Labs  09/05/14 1030  LABPROT 16.4*  INR 1.30   Hepatitis Panel No results for input(s): HEPBSAG, HCVAB, HEPAIGM, HEPBIGM in the last 72 hours.    ASSESSMENT/PLAN:    Ascites-s/p paracentesis 12/23 x 1.2 liters, 12/26 x 1.7 liters. 240 WBCs on #1 with no wbcs and no growth; 273 wbcs on #2 with rare wbc.  -chronic abd pain--much improved after paracentesis  -hepatosplenomegaly. Transjugular bx/ hepatic vein  wedge pressure measurement done yesterday.Noted to have elevated right heart and free hepatic venous pressures likely due to right heart failure.No evidence of portal hypertension. Liver bx results pending.  -Right lobe liver lesion. Felt to be hemangioma, Biopsy pending.  -thrombocytopenia. Dates back to 08/2013.  -ESRD. MWF HD.  -biventricular heart failure, acute right sided heart failure. Daily HD to address volume overload.     LOS: 8 days   Hvozdovic, Moise BoringLori P PA-C 09/07/2014, Pager [phone]  [facility] Attending  I have also seen and assessed the patient and agree with the above note. Liver biopsy has returned showing cirrhosis. 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 Presence of mallory's hyalin suggests a fatty liver component.   Overall there is no specific Tx to reverse any of this. I have no new recommendations other than volume management.  I think he has poor insight into his problems. Agree with cardiology MD that a palliative care consult could help him sort this out better. He could be a hospice candidate.  I also explained that he is not a candidate for routine screening colonoscopy 9he asked) for prevention.  One could make an argument to check for esophageal varices but his portal pressures suggest that he does not have high enough gradient  to cause those and given his overall poor prognosis i dont favor that.   Signing off  Iva Boop, MD, [facility] [phone] (pager) 09/07/2014 4:39 PM

## 2014-09-07 NOTE — Care Management Note (Addendum)
    Page 1 of 2   09/12/2014     12:47:59 PM CARE MANAGEMENT NOTE 09/12/2014  Patient:  Trevor [name], Trevor [name]   Account Number:  [id number]  Date Initiated:  09/07/2014  Documentation initiated by:  GRAVES-BIGELOW,BRENDA  Subjective/Objective Assessment:   Pt admitted for Abdominal discomfort. HX: ESRD, on hemodialysis. Ascites-s/p paracentesis 12/23 x 1.2 liters, 12/26 x 1.7 liters. 240 WBCs on #1 with no wbcs and no growth.     Action/Plan:   Per MD notes plan for palliative care consult for GOC. CM will continue to monitor for disposition needs.   Anticipated DC Date:  09/12/2014   Anticipated DC Plan:  SKILLED NURSING FACILITY  In-house referral  Clinical Social Worker      DC Associate Professor  CM consult      [facility] Choice  HOME HEALTH   Choice offered to / List presented to:  C-1 Patient        HH arranged  HH-1 RN  HH-2 PT      [facility] agency  [facility]   Status of service:  Completed, signed off Medicare Important Message given?  YES (If response is "NO", the following Medicare IM given date fields will be blank) Date Medicare IM given:  09/07/2014 Medicare IM given by:  GRAVES-BIGELOW,BRENDA Date Additional Medicare IM given:  09/11/2014 Additional Medicare IM given by:  Letha Cape  Discharge Disposition:  HOME [facility] SERVICES  Per UR Regulation:  Reviewed for med. necessity/level of care/duration of stay  If discussed at Long Length of Stay Meetings, dates discussed:    Comments:  09/12/14 1246 Letha Cape RN, BSN [phone] patient is for dc today, patient chose to go home with [facility] services with  [facility] for [facility], HHPT, referral made to [facility], Miranda notified.  Soc will begin 24-48 hrs post dc.  09/11/14 1553 Letha Cape RN, BSN [phone] patient is having tachycardia, tachypnea, and coughing of bloody stones.  Plan is for snf tomorrow.

## 2014-09-07 NOTE — Procedures (Signed)
I was present at this dialysis session, have reviewed the session itself and made  appropriate changes  [name] MD (pgr) [phone]    [phone] 09/07/2014, 11:43 AM

## 2014-09-08 DIAGNOSIS — I10 Essential (primary) hypertension: Secondary | ICD-10-CM

## 2014-09-08 LAB — CBC
HCT: 39.8 % (ref 39.0–52.0)
Hemoglobin: 13.8 g/dL (ref 13.0–17.0)
MCH: 28.5 pg (ref 26.0–34.0)
MCHC: 34.7 g/dL (ref 30.0–36.0)
MCV: 82.2 fL (ref 78.0–100.0)
Platelets: 107 10*3/uL — ABNORMAL LOW (ref 150–400)
RBC: 4.84 MIL/uL (ref 4.22–5.81)
RDW: 18.3 % — AB (ref 11.5–15.5)
WBC: 4.3 10*3/uL (ref 4.0–10.5)

## 2014-09-08 LAB — COMPREHENSIVE METABOLIC PANEL
ALK PHOS: 227 U/L — AB (ref 39–117)
ALT: 30 U/L (ref 0–53)
AST: 53 U/L — AB (ref 0–37)
Albumin: 2.6 g/dL — ABNORMAL LOW (ref 3.5–5.2)
Anion gap: 9 (ref 5–15)
BUN: 27 mg/dL — ABNORMAL HIGH (ref 6–23)
CO2: 29 mmol/L (ref 19–32)
Calcium: 8.8 mg/dL (ref 8.4–10.5)
Chloride: 100 mEq/L (ref 96–112)
Creatinine, Ser: 4.71 mg/dL — ABNORMAL HIGH (ref 0.50–1.35)
GFR calc Af Amer: 14 mL/min — ABNORMAL LOW (ref >90)
GFR, EST NON AFRICAN AMERICAN: 12 mL/min — AB (ref >90)
Glucose, Bld: 83 mg/dL (ref 70–99)
Potassium: 3.9 mmol/L (ref 3.5–5.1)
SODIUM: 138 mmol/L (ref 135–145)
Total Bilirubin: 3.1 mg/dL — ABNORMAL HIGH (ref 0.3–1.2)
Total Protein: 8.4 g/dL — ABNORMAL HIGH (ref 6.0–8.3)

## 2014-09-08 NOTE — Progress Notes (Signed)
[facility] Progress Note   Subjective: not coughing as much.  Liver biopsy showing severe cirrhosis, no etiology apparent on biopsy, "End stage liver disease" was path diagnosis  Filed Vitals:   09/07/14 1308 09/07/14 2230 09/08/14 0500 09/08/14 0511  BP: 137/99 142/93  129/95  Pulse: 128 90  105  Temp: 98.9 F (37.2 C) 98.4 F (36.9 C)  98.2 F (36.8 C)  TempSrc: Oral Oral  Oral  Resp: 22 32  28  Height:      Weight:   85.957 kg (189 lb 8 oz)   SpO2: 96% 94%  92%   Exam: Looks stronger, up walking in room, occ cough No jvd , no LAN in neck Chest coarse bilat basilar scattered rales Irreg , no rub/ gallop Abd +ascites, improved, +BS, nontender 1+ bilat pretib edema Neuro is nf, Ox 3, no flap  CXR 12/31 - improved and stable frmo 12/29, no gross edema, some chronic perihilar changes, no edema HD: MWF [name] 4h F200 500/800 97kg 2/2.0 [place] Heparin 3200 LUA AVF [name] 3 ug TIW       Assessment: 1. Dyspnea / cough / pulm edema - volume down 15 kg from admit, still hasn't dropped BP w HD 2. Cirrhosis / ESLD - by liver bx, etiology unknown 3. ESRD on HD MWF 4. Anemia Hb 14, no esa 5. MBD cont vit D, renvela 6. HTN holding labetalol d/t vol reduction 7. Volume - as above, still has some LE edema 8. Hx PE/ DVT Dec 2014 9. Afib - no AC d/t bleeding hx 10. Hyponatremia - d/t vol overload, resolved  Plan- plan HD in am, max UF as tolerated. After HD tomorrow suspect we will have gotten close to a new dry wt.      [name] MD  pager [phone]    cell [phone]  09/08/2014, 11:54 AM     Recent Labs Lab 09/04/14 1519 09/05/14 0713  09/06/14 1505 09/07/14 0527 09/08/14 0650  NA 134* 134*  < > 135 138 138  K 4.9 4.7  < > 4.2 4.0 3.9  CL 97 98  < > 98 97 100  CO2 24 24  < > 26 28 29   GLUCOSE 83 89  < > 95 78 83  BUN 49* 38*  < > 45* 32* 27*  CREATININE 6.94* 5.91*  < > 6.30* 5.12* 4.71*  CALCIUM 8.6 8.6  < > 8.6 8.6  8.8  PHOS 5.2* 4.5  --  4.2  --   --   < > = values in this interval not displayed.  Recent Labs Lab 09/05/14 1700 09/06/14 1505 09/07/14 0527 09/08/14 0650  AST 50*  --  54* 53*  ALT 26  --  30 30  ALKPHOS 220*  --  218* 227*  BILITOT 2.7*  --  2.6* 3.1*  PROT 7.0  --  7.2 8.4*  ALBUMIN 2.6* 2.6* 2.7* 2.6*    Recent Labs Lab 09/06/14 1505 09/07/14 0527 09/08/14 0650  WBC 4.6 4.3 4.3  HGB 13.2 13.8 13.8  HCT 37.8* 40.2 39.8  MCV 79.9 82.7 82.2  PLT 98* 97* 107*   . antiseptic oral rinse  7 mL Mouth Rinse BID  . aspirin EC  81 mg Oral Daily  . feeding supplement (NEPRO CARB STEADY)  237 mL Oral Daily  . multivitamin  1 tablet Oral QHS  . senna-docusate  2 tablet Oral BID  . sevelamer carbonate  3,200 mg Oral TID WC  .  sodium chloride  3 mL Intravenous Q12H  . sodium chloride  3 mL Intravenous Q12H  . triamcinolone ointment  1 application Topical Daily     sodium chloride, acetaminophen **OR** acetaminophen, albuterol, guaiFENesin-codeine, hydrOXYzine, morphine injection, ondansetron **OR** ondansetron (ZOFRAN) IV, oxyCODONE, phenol, sevelamer carbonate, sevelamer carbonate, sodium chloride

## 2014-09-08 NOTE — Progress Notes (Addendum)
Pt with an elevated bp of 133/100. Paged provider on call Benedetto Coons). Awaiting further orders. Just to continue to monitor pt per provider on call.

## 2014-09-08 NOTE — Progress Notes (Signed)
SUBJECTIVE:  No complaints  OBJECTIVE:   Vitals:   Filed Vitals:   09/07/14 1308 09/07/14 2230 09/08/14 0500 09/08/14 0511  BP: 137/99 142/93  129/95  Pulse: 128 90  105  Temp: 98.9 F (37.2 C) 98.4 F (36.9 C)  98.2 F (36.8 C)  TempSrc: Oral Oral  Oral  Resp: 22 32  28  Height:      Weight:   189 lb 8 oz (85.957 kg)   SpO2: 96% 94%  92%   I&O's:   Intake/Output Summary (Last 24 hours) at 09/08/14 0943 Last data filed at 09/07/14 1300  Gross per 24 hour  Intake    240 ml  Output   3501 ml  Net  -3261 ml   TELEMETRY: Reviewed telemetry pt in NSR:     PHYSICAL EXAM General: Well developed, well nourished, in no acute distress Head: Eyes PERRLA, No xanthomas.   Normal cephalic and atramatic  Lungs:   Clear bilaterally to auscultation and percussion. Heart:   HRRR S1 S2 Pulses are 2+ & equal. Abdomen: Bowel sounds are positive, abdomen soft and non-tender without masses  Extremities:   No clubbing, cyanosis or edema.  DP +1 Neuro: Alert and oriented X 3. Psych:  Good affect, responds appropriately   LABS: Basic Metabolic Panel:  Recent Labs  16/10/96 1505 09/07/14 0527 09/08/14 0650  NA 135 138 138  K 4.2 4.0 3.9  CL 98 97 100  CO2 GLUCOSE 95 78 83  BUN 45* 32* 27*  CREATININE 6.30* 5.12* 4.71*  CALCIUM 8.6 8.6 8.8  PHOS 4.2  --   --    Liver Function Tests:  Recent Labs  09/07/14 0527 09/08/14 0650  AST 54* 53*  ALT 30 30  ALKPHOS 218* 227*  BILITOT 2.6* 3.1*  PROT 7.2 8.4*  ALBUMIN 2.7* 2.6*   No results for input(s): LIPASE, AMYLASE in the last 72 hours. CBC:  Recent Labs  09/07/14 0527 09/08/14 0650  WBC 4.3 4.3  HGB 13.8 13.8  HCT 40.2 39.8  MCV 82.7 82.2  PLT 97* 107*   Cardiac Enzymes: No results for input(s): CKTOTAL, CKMB, CKMBINDEX, TROPONINI in the last 72 hours. BNP: Invalid input(s): POCBNP D-Dimer: No results for input(s): DDIMER in the last 72 hours. Hemoglobin A1C: No results for input(s): HGBA1C  in the last 72 hours. Fasting Lipid Panel: No results for input(s): CHOL, HDL, LDLCALC, TRIG, CHOLHDL, LDLDIRECT in the last 72 hours. Thyroid Function Tests: No results for input(s): TSH, T4TOTAL, T3FREE, THYROIDAB in the last 72 hours.  Invalid input(s): FREET3 Anemia Panel: No results for input(s): VITAMINB12, FOLATE, FERRITIN, TIBC, IRON, RETICCTPCT in the last 72 hours. Coag Panel:   Lab Results  Component Value Date   INR 1.30 09/05/2014   INR 1.15 08/30/2014   INR 1.21 09/06/2013    RADIOLOGY: Ct Abdomen Pelvis Wo Contrast  08/30/2014   CLINICAL DATA:  Worsening abdominal distention and generalized abdominal pain, chronic in nature. Initial encounter.  EXAM: CT ABDOMEN AND PELVIS WITHOUT CONTRAST  TECHNIQUE: Multidetector CT imaging of the abdomen and pelvis was performed following the standard protocol without IV contrast.  COMPARISON:  CT of the abdomen and pelvis from 03/26/2014  FINDINGS: Mild patchy bilateral atelectasis is noted. Diffuse coronary artery calcifications are seen. Scattered calcified enlarged hilar and mediastinal nodes are again seen, measuring up to 3.0 cm in short axis. Cardiomegaly is noted.  There is significantly increased moderate to large volume ascites within the  abdomen and pelvis.  Hepatomegaly is again noted. A 1.9 cm focus of decreased attenuation within the right hepatic lobe, with overlying retraction of the hepatic capsule, appears to have increased in size from the prior study. Malignancy cannot be excluded. Evaluation for additional hepatic masses is limited without contrast. The spleen is enlarged, as on the prior study, measuring 13.8 cm in length.  The pancreas and adrenal glands are unremarkable in appearance.  Severe bilateral renal atrophy is noted, with a few scattered renal cysts. Nonspecific perinephric stranding is noted bilaterally. There is no evidence of hydronephrosis. No renal or ureteral stones are seen.  The small bowel is unremarkable  in appearance. The stomach is within normal limits. No acute vascular abnormalities are seen. Scattered calcification is seen along the abdominal aorta and its branches. The vasculature is difficult to fully assess without contrast.  The appendix is not definitely characterized given surrounding ascites. The colon is partially filled with stool and is grossly unremarkable in appearance, though difficult to fully characterize due to surrounding ascites.  The bladder is mildly distended and unremarkable in appearance. The prostate remains normal in size, with scattered calcification. Mildly enlarged bilateral inguinal nodes are seen, measuring up to 1.5 cm; some of this may reflect underlying soft tissue edema.  Diffuse soft tissue edema is again noted, raising concern for anasarca. This is mildly improved from the prior study.  No acute osseous abnormalities are identified. Diffuse uniform sclerotic change within the visualized osseous structures reflects renal osteodystrophy.  IMPRESSION: 1. Significantly increased moderate to large volume abdominopelvic ascites. 2. 1.9 cm focus of decreased attenuation within the right hepatic lobe, with overlying retraction of the hepatic capsule, appears to have increased in size from the prior study. Malignancy cannot be excluded. Would correlate with LFTs and consider dynamic liver protocol CT for further evaluation. 3. Splenomegaly and hepatomegaly noted. 4. Cardiomegaly noted.  Diffuse coronary artery calcifications seen. 5. Scattered calcified enlarged hilar and mediastinal nodes again noted, measuring up to 3.0 cm in short axis. These were present in 2014, and thought to reflect remote granulomatous disease. 6. Mild patchy bilateral atelectasis noted. 7. Severe bilateral renal atrophy, with a few scattered renal cysts. 8. Scattered calcification along the abdominal aorta and its branches. 9. Mildly enlarged bilateral inguinal nodes, measuring up to 1.5 cm in short axis.  This may partially reflect underlying soft tissue edema. 10. Diffuse soft tissue edema, raising concern for anasarca; this is mildly improved from the prior study. 11. Findings of renal osteodystrophy.   Electronically Signed   By: Roanna Raider M.D.   On: 08/30/2014 07:44   Dg Chest 2 View  08/30/2014   CLINICAL DATA:  Acute onset of shortness of breath and abdominal distention. Initial encounter.  EXAM: CHEST  2 VIEW  COMPARISON:  Chest radiograph performed 01/16/2014, and CT of the chest performed 09/01/2013  FINDINGS: The lungs are hypoexpanded. Bibasilar airspace opacities may reflect multifocal pneumonia or pulmonary edema. There is no evidence of pleural effusion or pneumothorax.  The heart is enlarged.  No acute osseous abnormalities are seen.  IMPRESSION: Lungs hypoexpanded. Bibasilar airspace opacities may reflect multifocal pneumonia or mild pulmonary edema. Cardiomegaly noted.   Electronically Signed   By: Roanna Raider M.D.   On: 08/30/2014 05:41   Ct Angio Chest Pe W/cm &/or Wo Cm  09/05/2014   CLINICAL DATA:  Shortness of breath.  EXAM: CT ANGIOGRAPHY CHEST WITH CONTRAST  TECHNIQUE: Multidetector CT imaging of the chest was performed using the  standard protocol during bolus administration of intravenous contrast. Multiplanar CT image reconstructions and MIPs were obtained to evaluate the vascular anatomy.  CONTRAST:  OMNIPAQUE IOHEXOL 350 MG/ML SOLN  COMPARISON:  Chest x-ray dated 09/05/2014 and CT angiogram dated 06/28/2013  FINDINGS: There is a tiny linear filling defect in a single or branch of the pulmonary artery to the right middle lobe seen on image 54 of series 4. I suspect this is the remnant of an old pulmonary embolism. There are no acute pulmonary emboli.  There is chronic cardiomegaly. There is extensive hilar or mediastinal adenopathy with calcifications consistent with previous granulomatous disease or sarcoidosis.  There is peribronchial thickening with accentuation of  the interstitial markings, most likely due to slight pulmonary edema. No effusions.  The patient has ascites with hepatomegaly. The bones are diffusely sclerotic due to secondary hyperparathyroidism.  Review of the MIP images confirms the above findings.  IMPRESSION: 1. Cardiomegaly with pulmonary vascular congestion and slight interstitial edema consistent with congestive heart failure. 2. No acute pulmonary emboli. One tiny linear old embolus in a branch of the right pulmonary artery to the middle lobe. 3. Ascites.  Hepatomegaly.   Electronically Signed   By: Geanie Cooley M.D.   On: 09/05/2014 20:50   US Abdomen Complete  08/30/2014   CLINICAL DATA:  Abnormal liver function tests.  Ascites.  EXAM: ULTRASOUND ABDOMEN COMPLETE  COMPARISON:  None.  FINDINGS: Gallbladder: Cholelithiasis. Gallbladder wall thickening measuring 9.1 mm. Negative sonographic Murphy's sign. Hyperechoic foci along the gallbladder wall with ring down artifact as can be seen with adenomyomatosis.  Common bile duct: Diameter: 5.5 mm  Liver: No focal lesion identified. Increased hepatic parenchymal echogenicity. There is hepatomegaly.  IVC: No abnormality visualized.  Pancreas: Visualized portion unremarkable.  Spleen: The spleen is enlarged measuring 720 mL. No focal splenic lesion.  Right Kidney: Length: 7.1 cm. Echogenicity within normal limits. No mass or hydronephrosis visualized.  Left Kidney: Length: 5.8 cm. There is an anechoic left renal mass with increased through transmission measuring 13 x 17 mm most consistent with a cyst. Echogenicity within normal limits. No hydronephrosis visualized.  Abdominal aorta: The proximal abdominal aorta measures 3 cm in AP diameter.  Other findings: There is moderate abdominal ascites.  IMPRESSION: 1. Cholelithiasis. Gallbladder wall thickening which may be secondary to intrinsic gallbladder wall abnormality suggest cholecystitis, but this appearance can be seen in the setting of hepatocellular  disease and ascites. 2. Atrophic kidneys. 3. Splenomegaly. 4. The hypodense mass seen in the right hepatic lobe adjacent to the liver capsule on CT abdomen performed same day is not visualized on the ultrasound. Further evaluation with a CT or MRI of the abdomen is recommended optimized for evaluation of the liver. 5. Proximal abdominal aorta measures 3 cm in AP diameter. Recommend followup by [place] in 3 years. This recommendation follows ACR consensus guidelines: White Paper of the ACR Incidental Findings Committee II on Vascular Findings. Earlyne Iba Radiol 2013; 16:109-604   Electronically Signed   By: Elige Ko   On: 08/30/2014 17:55   Ct Abdomen Pelvis W Contrast  08/30/2014   CLINICAL DATA:  Abdominal tenderness in distension.  Liver lesion  EXAM: CT ABDOMEN AND PELVIS WITH CONTRAST  TECHNIQUE: Multidetector CT imaging of the abdomen and pelvis was performed using the standard protocol following bolus administration of intravenous contrast.  CONTRAST:  80mL OMNIPAQUE IOHEXOL 300 MG/ML  SOLN  COMPARISON:  CT 08/30/2014 at 6:30 a.m.  FINDINGS: Lower chest: Interlobular septal  thickening at the lung bases. No pleural fluid. Heart is enlarged. Pericardial fluid.  Hepatobiliary: There is reflux of contrast into the hepatic veins suggesting right heart dysfunction. Moderate volume of fluid surrounding the margin of the liver similar to prior. Slight decrease in the fluid in the pelvis. There is mild traction of the right hepatic lobe not changed from prior with a small hypodense lesions. Gallbladder is collapsed.  Pancreas: Difficult to evaluate the intra-abdominal organs due to ascites. No gross abnormality pancreas.  Spleen: Spleen is enlarged.  Adrenals/urinary tract: Adrenal is normal. Kidneys are atrophic. Bladder is collapsed. No your ureter abnormality  Stomach/Bowel: Stomach, small bowel, colon are unremarkable. There is progression of the oral contrast from the prior scan into the descending colon rectum.   Vascular/Lymphatic: Abdominal aorta is normal caliber no retroperitoneal periportal lymphadenopathy.  Reproductive: Prostate gland is normal.  Musculoskeletal: There is diffuse gross bone consistent with renal osteodystrophy.  Other: Anasarca  IMPRESSION: 1. Interval decrease in volume of intraperitoneal free fluid volume paracentesis. Moderate volume of ascites remains. 2. No other significant interval change in short interval follow-up from 6:30 a.m. same day 3. No evidence of bowel obstruction. 4. Splenomegaly 5. Evidence of right heart dysfunction with reflux of contrast into hepatic veins. 6. Interlobular septal thickening at the lung bases consistent with interstitial edema.   Electronically Signed   By: Genevive Bi M.D.   On: 08/30/2014 14:42   Ir Venogram Hepatic W Hemodynamic Evaluation  09/05/2014   CLINICAL DATA:  64 year old male with hepatosplenomegaly and abdominal ascites which began after umbilical herniorrhaphy in in July of 2015. Patient has history of biventricular right heart failure with acute right-sided heart failure. Transjugular hepatic venogram with pressure measurements and biopsy warranted to evaluate for hepatic cirrhosis and portal hypertension.  EXAM: TRANSCATHETER BIOPSY; IR ULTRASOUND GUIDANCE VASC ACCESS RIGHT; VENO HEPATIC W/ HEMODYNAMICS  Date: 09/05/2014  PROCEDURE: 1. Ultrasound-guided puncture of the right internal jugular vein 2. Catheterization of the right hepatic vein with hepatic venogram 3. Balloon occluded right hepatic venogram 4. The free in balloon occluded hepatic venous pressures were recorded 5. Transjugular liver biopsy Interventional Radiologist:  Sterling Big, MD  ANESTHESIA/SEDATION: Moderate (conscious) sedation was used. One mg Versed, 25 mcg Fentanyl were administered intravenously. The patient's vital signs were monitored continuously by radiology nursing throughout the procedure.  Sedation Time: 32 minutes  MEDICATIONS: None  FLUOROSCOPY  TIME:  6 min 48 seconds  246.1 mGy  CONTRAST:  20mL OMNIPAQUE IOHEXOL 300 MG/ML  SOLN  TECHNIQUE: Informed consent was obtained from the patient following explanation of the procedure, risks, benefits and alternatives. The patient understands, agrees and consents for the procedure. All questions were addressed. A time out was performed.  Maximal barrier sterile technique utilized including caps, mask, sterile gowns, sterile gloves, large sterile drape, hand hygiene, and Betadine skin prep.  The right neck was interrogated with ultrasound. The internal jugular vein is widely patent and compressible. An image was obtained and stored for the medical record. Local anesthesia was attained by infiltration with 1% lidocaine. A small dermatotomy was made. Under real-time sonographic guidance, the vessel was punctured with a 21 gauge micropuncture needle. With the assistance of a 5 [place] transitional micro sheath, the 0.018 inch wire was exchanged for a Bentson wire. This was navigated through the heart and into the inferior vena cava under fluoroscopic guidance.  The skin tract was then dilated to 10 [place]. A 9 French cook flexor sheath was advanced over the wire  and positioned in the right atrium. Through this, and MPA catheter and Bentson wire were used to select the right hepatic vein. The MPA catheter was advanced into the right hepatic vein and right hepatic venogram was performed. The right hepatic vein is widely patent. No evidence of a Budd-Chiari syndrome. The right hepatic vein is relatively robust in size.  The 9 French sheath was advanced into the right hepatic vein followed by a the Fogarty balloon occlusion catheter. The balloon was inflated and a balloon occluded hepatic venogram was performed demonstrating successful occlusion of the distal hepatic vein. Hepatic venous pressure measurements were then recorded. Measurements were obtained serially 3 times to ensure accuracy.  Free hepatic vein:  32/21 (mean  26 mmHg)  Wedged hepatic venous pressure:  33/26 (mean 29 mmHg)  Suprahepatic IVC:  Mean 23 mmHg  Right atrium:  Mean 21 mm Hg  The transjugular biopsy device was then advanced through the sheath and three 18 gauge core biopsies were obtained. Biopsy specimens were placed in formalin. The sheath was removed. Hemostasis was attained with manual pressure. The patient tolerated the procedure well.  COMPLICATIONS: None  IMPRESSION: 1. Patent right hepatic vein.  No evidence of Budd-Chiari. 2. Markedly elevated right heart and free hepatic venous pressures likely secondary to right heart failure. 3. Negative for evidence of portal hypertension. The portosystemic gradient ranged from 3- 8 mmHg which is within normal limits. 4. Successful transjugular random liver biopsy. Signed,  Sterling Big, MD  [facility]  [facility]   Electronically Signed   By: Malachy Moan M.D.   On: 09/05/2014 17:24   Ir Transcatheter Bx  09/05/2014   CLINICAL DATA:  64 year old male with hepatosplenomegaly and abdominal ascites which began after umbilical herniorrhaphy in in July of 2015. Patient has history of biventricular right heart failure with acute right-sided heart failure. Transjugular hepatic venogram with pressure measurements and biopsy warranted to evaluate for hepatic cirrhosis and portal hypertension.  EXAM: TRANSCATHETER BIOPSY; IR ULTRASOUND GUIDANCE VASC ACCESS RIGHT; VENO HEPATIC W/ HEMODYNAMICS  Date: 09/05/2014  PROCEDURE: 1. Ultrasound-guided puncture of the right internal jugular vein 2. Catheterization of the right hepatic vein with hepatic venogram 3. Balloon occluded right hepatic venogram 4. The free in balloon occluded hepatic venous pressures were recorded 5. Transjugular liver biopsy Interventional Radiologist:  Sterling Big, MD  ANESTHESIA/SEDATION: Moderate (conscious) sedation was used. One mg Versed, 25 mcg Fentanyl were administered  intravenously. The patient's vital signs were monitored continuously by radiology nursing throughout the procedure.  Sedation Time: 32 minutes  MEDICATIONS: None  FLUOROSCOPY TIME:  6 min 48 seconds  246.1 mGy  CONTRAST:  20mL OMNIPAQUE IOHEXOL 300 MG/ML  SOLN  TECHNIQUE: Informed consent was obtained from the patient following explanation of the procedure, risks, benefits and alternatives. The patient understands, agrees and consents for the procedure. All questions were addressed. A time out was performed.  Maximal barrier sterile technique utilized including caps, mask, sterile gowns, sterile gloves, large sterile drape, hand hygiene, and Betadine skin prep.  The right neck was interrogated with ultrasound. The internal jugular vein is widely patent and compressible. An image was obtained and stored for the medical record. Local anesthesia was attained by infiltration with 1% lidocaine. A small dermatotomy was made. Under real-time sonographic guidance, the vessel was punctured with a 21 gauge micropuncture needle. With the assistance of a 5 [place] transitional micro sheath, the 0.018 inch wire was exchanged for a Bentson wire. This was navigated  through the heart and into the inferior vena cava under fluoroscopic guidance.  The skin tract was then dilated to 10 [place]. A 9 French cook flexor sheath was advanced over the wire and positioned in the right atrium. Through this, and MPA catheter and Bentson wire were used to select the right hepatic vein. The MPA catheter was advanced into the right hepatic vein and right hepatic venogram was performed. The right hepatic vein is widely patent. No evidence of a Budd-Chiari syndrome. The right hepatic vein is relatively robust in size.  The 9 French sheath was advanced into the right hepatic vein followed by a the Fogarty balloon occlusion catheter. The balloon was inflated and a balloon occluded hepatic venogram was performed demonstrating successful occlusion of the  distal hepatic vein. Hepatic venous pressure measurements were then recorded. Measurements were obtained serially 3 times to ensure accuracy.  Free hepatic vein:  32/21 (mean 26 mmHg)  Wedged hepatic venous pressure:  33/26 (mean 29 mmHg)  Suprahepatic IVC:  Mean 23 mmHg  Right atrium:  Mean 21 mm Hg  The transjugular biopsy device was then advanced through the sheath and three 18 gauge core biopsies were obtained. Biopsy specimens were placed in formalin. The sheath was removed. Hemostasis was attained with manual pressure. The patient tolerated the procedure well.  COMPLICATIONS: None  IMPRESSION: 1. Patent right hepatic vein.  No evidence of Budd-Chiari. 2. Markedly elevated right heart and free hepatic venous pressures likely secondary to right heart failure. 3. Negative for evidence of portal hypertension. The portosystemic gradient ranged from 3- 8 mmHg which is within normal limits. 4. Successful transjugular random liver biopsy. Signed,  Sterling Big, MD  [facility]  [facility]   Electronically Signed   By: Malachy Moan M.D.   On: 09/05/2014 17:24   US Paracentesis  09/02/2014   INDICATION: Recurrent ascites, shortness of breath, request for paracentesis.  EXAM: ULTRASOUND-GUIDED PARACENTESIS  COMPARISON:  08/30/14.  MEDICATIONS: None.  COMPLICATIONS: None immediate  TECHNIQUE: Informed written consent was obtained from the patient after a discussion of the risks, benefits and alternatives to treatment. A timeout was performed prior to the initiation of the procedure.  Initial ultrasound scanning demonstrates a large amount of ascites within the left lower abdominal quadrant. The left lower abdomen was prepped and draped in the usual sterile fashion. 1% lidocaine was used for local anesthesia. An ultrasound image was saved for documentation purposed. An 8 Fr Safe-T-Centesis catheter was introduced. The paracentesis was performed. The catheter  was removed and a dressing was applied. The patient tolerated the procedure well without immediate post procedural complication.  FINDINGS: A total of approximately 1.7 liters of serous/amber colored fluid was removed. Samples were sent to the laboratory as requested by the clinical team.  IMPRESSION: Successful ultrasound-guided paracentesis yielding 1.7 liters of peritoneal fluid.  Read By:  Pattricia Boss PA-C   Electronically Signed   By: Ruel Favors M.D.   On: 09/02/2014 13:39   US Paracentesis  08/30/2014   INDICATION: Ascites.  EXAM: ULTRASOUND-GUIDED PARACENTESIS  COMPARISON:  None.  MEDICATIONS: 10 cc 1% lidocaine  COMPLICATIONS: None immediate  TECHNIQUE: Informed written consent was obtained from the patient after a discussion of the risks, benefits and alternatives to treatment. A timeout was performed prior to the initiation of the procedure.  Initial ultrasound scanning demonstrates a large amount of ascites within the left lower abdominal quadrant. The left Lower abdomen was prepped and draped in the usual  sterile fashion. 1% lidocaine with epinephrine was used for local anesthesia. Under direct ultrasound guidance, a 19 gauge, 7-cm, Yueh catheter was introduced. An ultrasound image was saved for documentation purposed. the paracentesis was performed. The catheter was removed and a dressing was applied. The patient tolerated the procedure well without immediate post procedural complication.  FINDINGS: A total of approximately 1.2 liters of amber fluid was removed. Samples were sent to the laboratory as requested by the clinical team.  IMPRESSION: Successful ultrasound-guided paracentesis yielding 1.2 liters of peritoneal fluid.  Read by Robet Leu [facility]   Electronically Signed   By: Richarda Overlie M.D.   On: 08/30/2014 16:27   Ir US Guide Vasc Access Right  09/05/2014   CLINICAL DATA:  64 year old male with hepatosplenomegaly and abdominal ascites which began after umbilical herniorrhaphy in  in July of 2015. Patient has history of biventricular right heart failure with acute right-sided heart failure. Transjugular hepatic venogram with pressure measurements and biopsy warranted to evaluate for hepatic cirrhosis and portal hypertension.  EXAM: TRANSCATHETER BIOPSY; IR ULTRASOUND GUIDANCE VASC ACCESS RIGHT; VENO HEPATIC W/ HEMODYNAMICS  Date: 09/05/2014  PROCEDURE: 1. Ultrasound-guided puncture of the right internal jugular vein 2. Catheterization of the right hepatic vein with hepatic venogram 3. Balloon occluded right hepatic venogram 4. The free in balloon occluded hepatic venous pressures were recorded 5. Transjugular liver biopsy Interventional Radiologist:  Sterling Big, MD  ANESTHESIA/SEDATION: Moderate (conscious) sedation was used. One mg Versed, 25 mcg Fentanyl were administered intravenously. The patient's vital signs were monitored continuously by radiology nursing throughout the procedure.  Sedation Time: 32 minutes  MEDICATIONS: None  FLUOROSCOPY TIME:  6 min 48 seconds  246.1 mGy  CONTRAST:  20mL OMNIPAQUE IOHEXOL 300 MG/ML  SOLN  TECHNIQUE: Informed consent was obtained from the patient following explanation of the procedure, risks, benefits and alternatives. The patient understands, agrees and consents for the procedure. All questions were addressed. A time out was performed.  Maximal barrier sterile technique utilized including caps, mask, sterile gowns, sterile gloves, large sterile drape, hand hygiene, and Betadine skin prep.  The right neck was interrogated with ultrasound. The internal jugular vein is widely patent and compressible. An image was obtained and stored for the medical record. Local anesthesia was attained by infiltration with 1% lidocaine. A small dermatotomy was made. Under real-time sonographic guidance, the vessel was punctured with a 21 gauge micropuncture needle. With the assistance of a 5 [place] transitional micro sheath, the 0.018 inch wire was exchanged  for a Bentson wire. This was navigated through the heart and into the inferior vena cava under fluoroscopic guidance.  The skin tract was then dilated to 10 [place]. A 9 French cook flexor sheath was advanced over the wire and positioned in the right atrium. Through this, and MPA catheter and Bentson wire were used to select the right hepatic vein. The MPA catheter was advanced into the right hepatic vein and right hepatic venogram was performed. The right hepatic vein is widely patent. No evidence of a Budd-Chiari syndrome. The right hepatic vein is relatively robust in size.  The 9 French sheath was advanced into the right hepatic vein followed by a the Fogarty balloon occlusion catheter. The balloon was inflated and a balloon occluded hepatic venogram was performed demonstrating successful occlusion of the distal hepatic vein. Hepatic venous pressure measurements were then recorded. Measurements were obtained serially 3 times to ensure accuracy.  Free hepatic vein:  32/21 (mean 26 mmHg)  Wedged hepatic venous  pressure:  33/26 (mean 29 mmHg)  Suprahepatic IVC:  Mean 23 mmHg  Right atrium:  Mean 21 mm Hg  The transjugular biopsy device was then advanced through the sheath and three 18 gauge core biopsies were obtained. Biopsy specimens were placed in formalin. The sheath was removed. Hemostasis was attained with manual pressure. The patient tolerated the procedure well.  COMPLICATIONS: None  IMPRESSION: 1. Patent right hepatic vein.  No evidence of Budd-Chiari. 2. Markedly elevated right heart and free hepatic venous pressures likely secondary to right heart failure. 3. Negative for evidence of portal hypertension. The portosystemic gradient ranged from 3- 8 mmHg which is within normal limits. 4. Successful transjugular random liver biopsy. Signed,  Sterling Big, MD  [facility]  [facility]   Electronically Signed   By: Malachy Moan M.D.   On: 09/05/2014  17:24   Dg Chest Port 1 View  09/05/2014   CLINICAL DATA:  Heart failure, cirrhosis and ascites. Cough and congestion.  EXAM: PORTABLE CHEST - 1 VIEW  COMPARISON:  08/30/2014  FINDINGS: Stable chronic areas of pulmonary scarring and interstitial prominence. No overt edema or infiltrate is identified. No evidence of pleural fluid or pneumothorax. There is stable moderate cardiomegaly. Stable mediastinal soft tissue prominence consistent with previously depicted chronic lymphadenopathy and granulomatous disease.  IMPRESSION: Stable pulmonary scarring and interstitial lung disease. Stable cardiomegaly and mediastinal lymphadenopathy.   Electronically Signed   By: Irish Lack M.D.   On: 09/05/2014 08:07   Dg Chest Port 1v Same Day  09/07/2014   CLINICAL DATA:  Cough for 1 week initial evaluation, just completed dialysis  EXAM: PORTABLE CHEST - 1 VIEW SAME DAY  COMPARISON:  09/05/2014  FINDINGS: Severe cardiac silhouette enlargement. Prominent azygos arch region of the right upper mediastinum corresponding to calcified adenopathy seen on previous CT scans.  Mild vascular congestion. No evidence of pulmonary edema or pneumonia. No pleural effusion or pneumothorax. Stable mild pulmonary parenchymal scarring.  IMPRESSION: No active disease. Stable cardiac enlargement, mediastinal adenopathy, and parenchymal scarring.   Electronically Signed   By: Esperanza Heir M.D.   On: 09/07/2014 14:18    ASSESSMENT AND PLAN 1. 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  - Echo 09/04/2014 EF 25-30%, diffuse hypokinesis, akinesis of anteroseptal myocardium, severely dilated RV with moderately reduced RVEF, severe TR, PA peak pressure - CTA of chest  negative for recurrent PE   - volume management through HD per renal 2. Massive ascites with new diagnosis of cirrhosis by liver bx 3. Chronic abdominal pain:  - hepatosplenomegaly noted on CT, reflux of contrast into hepatic vein suggesting R heart dysfunction 4. ESRD on HD MWF: dialyzed Mon and Tue (today) 5. CAD s/p remote MI in 1990s, no recent sign of angina 6. Liver lesion - per GI 7. Syncope: per IM 8. Hypertension 9. Chronic atrial fibrillation on aspirin: refused coumadin given h/o significant hematoma - rate controlled. Although return of atrial kick may improve HF symptom, however risk of stroke on ASA alone is too high and pt does not want systemic anticoagulation 10. obstructive sleep apnea 11. history of DVT/PE 12. PVD 13. Chronic cough: per patient has been going on since last yr. Febrile last night with Tmax 101, ?accuracy, no longer febrile this morning.  Would recommend pursuing Palliative Care consult given new diagnosis of cirrhosis.  Not much more to offer from a cardiac standpoint.      [name], MD  09/08/2014  9:43 AM

## 2014-09-08 NOTE — Progress Notes (Signed)
PROGRESS NOTE    [name]:[id number] DOB: [birth year] DOA: 08/30/2014 PCP: [name], MD  HPI/Brief narrative 64 year old male patient with history of ESRD on HD MWF, chronic systolic CHF, A. 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  He had dyspnea secondary to abdominal distention. He had vigorous coughing spells and passed out. He saw his cardiologist and was advised to come to the hospital. He underwent diagnostic and therapeutic paracentesis by IR, twice. Nephrology consulting as he is receiving daily HD during this hospitalization. GI consulted and recommended transjugular liver biopsy which was completed on 12/29. Cardiology consulting for biventricular heart failure.  Subjective: Patient reports breathing is improved and abdominal pain is improved.  Assessment/Plan:  Massive ascites with new diagnosis of Cirrhosis:  Ascites improved with HD - Multifactorial, possibly from Cirrhosis as well as congestive hepatopathy from RHF.  - Status post 1.2 L and 1.7 L paracentesis by IR. No SBP.  - Receiving daily hemodialysis to control the fluid.   - GI suggests cirrhosis with portal hypertension accounting for ascites and splenomegaly.  - s/p transjugular liver biopsy 12/29-results indicate cirrhosis - possibly of primary biliary source.    Acute on chronic biventricular failure, predominantly right heart failure: Volume overloaded-management across dialysis. 2-D echo 12/28: Moderate LVH, LVEF 25-30 percent, diffuse hypokinesis. Akinesis of anteroseptal myocardium. PA peak pressure 66 mmHg. Cardiology input appreciated: Continued hemodialysis to avoid acute left heart failure,  CTA chest negative for recurrent PE.  Cardiology suggests Palliative care may be the best option.  Overall prognosis appears poor.  Chronic  cough with odynaphagia:  Patient indicates his pain is in his throat.  Will talk with ENT about possible consultation vs outpatient follow up.  ESRD on MWF HD: Management per nephrology. Nephrology plans to dialyze daily until improvement of volume status.  Liver lesion seen on CT but not ultrasound:  Will defer management to GI.    Syncope: Possibly cough syncope. Initial troponin minimally elevated which could be secondary to renal failure but subsequent troponins have been negative. No clinical pneumonia.  History of chronic A. fib: Rate controlled in the 90s. Warfarin anticoagulation discontinued secondary to spontaneous back hematoma and patient has declined anticoagulation since.  History of essential hypertension: Mildly uncontrolled. Labetalol discontinued to allow blood pressure for dialysis.  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  Leukopenia and thrombocytopenia: Secondary to liver disease. Follow CBCs. Stable.? Hypersplenism.  Palliative Medicine:  Patient with failure of 3 organ systems.   Discussed code status again on 12/31 and 1/1, remaines full code - open today to discuss with palliative medicine, have asked for consultation, appreciate input.     Code Status: Full  Family Communication: discussed with his son, Dr. Daphine Trevor [name] [phone] Disposition Plan: Home when medically stable   Consultants:  Nephrology  Gastroenterology  Cardiology  Interventional radiology.  Procedures:  Paracentesis 1.2 L by IR on 12/23 and 1.7 L on 12/26  Hemodialysis  Transjugular liver biopsy 12/29  Antibiotics:  None   Objective: Filed Vitals:   09/07/14 1308 09/07/14 2230 09/08/14 0500 09/08/14 0511  BP: 137/99 142/93  129/95  Pulse: 128 90  105  Temp: 98.9 F (37.2 C) 98.4 F (36.9 C)  98.2 F (36.8 C)  TempSrc: Oral Oral  Oral  Resp: 22 32  28  Height:        Weight:   85.957 kg (189 lb 8 oz)   SpO2: 96% 94%  92%    Intake/Output Summary (Last 24 hours) at 09/08/14 1033 Last data filed at 09/07/14 1300  Gross per 24 hour  Intake    240 ml  Output   3501 ml  Net  -3261 ml   Filed Weights   09/07/14 0805 09/07/14 1212 09/08/14 0500  Weight: 90.9 kg (200 lb 6.4 oz) 85.1 kg (187 lb 9.8 oz) 85.957 kg (189 lb 8 oz)   Exam: General exam: Poorly nourished, moderately built male lying comfortably in bed.  Dilated veins of upper trunk and neck.  Respiratory system: Breathing is stilted.  Expiratory breath sounds are course. Cardiovascular system: S1 & S2 heard, irregularly irregular. No JVD, murmurs, gallops, clicks. Gastrointestinal system: Abdomen is moderately distended(decreased), soft and nontender.  Normal bowel sounds heard. +Periumbilical hernia. Central nervous system: Alert and oriented. No focal neurological deficits. Skin: no rashes Extremities: Symmetric 5 x 5 power.  Data Reviewed: Basic Metabolic Panel:  Recent Labs Lab 09/02/14 0552  09/03/14 0805 09/04/14 1519 09/05/14 0713 09/05/14 1700 09/06/14 1505 09/07/14 0527 09/08/14 0650  NA  --   < > 134* 134* 134* 139 135 138 138  K  --   < > 5.2* 4.9 4.7 4.4 4.2 4.0 3.9  CL  --   < > 97 97 98 100 98 97 100  CO2  --   < > 20 24 24 26 26 28 29   GLUCOSE  --   < > 89 83 89 96 95 78 83  BUN  --   < > 58* 49* 38* 27* 45* 32* 27*  CREATININE  --   < > 8.08* 6.94* 5.91* 4.89* 6.30* 5.12* 4.71*  CALCIUM  --   < > 8.5 8.6 8.6 8.8 8.6 8.6 8.8  MG 2.4  --   --   --   --   --   --   --   --   PHOS  --   --  6.2* 5.2* 4.5  --  4.2  --   --   < > = values in this interval not displayed. Liver Function Tests:  Recent Labs Lab 09/05/14 0713 09/05/14 1700 09/06/14 1505 09/07/14 0527 09/08/14 0650  AST  --  50*  --  54* 53*  ALT  --  26  --  30 30  ALKPHOS  --  220*  --  218* 227*  BILITOT  --  2.7*  --  2.6* 3.1*  PROT  --  7.0  --  7.2 8.4*  ALBUMIN 2.6* 2.6* 2.6* 2.7* 2.6*    CBC:  Recent Labs Lab 09/04/14 1519 09/05/14 1700 09/06/14 1505 09/07/14 0527 09/08/14 0650  WBC 2.6* 4.7 4.6 4.3 4.3  HGB 13.0 14.6 13.2 13.8 13.8  HCT 37.6* 42.2 37.8* 40.2 39.8  MCV 81.9 82.9 79.9 82.7 82.2  PLT 82* 97* 98* 97* 107*   BNP (last 3 results)  Recent Labs  08/29/14 1421  PROBNP 1830.0*    Recent Results (from the past 240 hour(s))  Body fluid culture     Status: None   Collection Time: 08/30/14  1:27 PM  Result Value Ref Range Status   Specimen Description FLUID ABDOMEN ASCITIC  Final   Special Requests NONE  Final   Gram Stain   Final    NO WBC SEEN NO ORGANISMS SEEN Performed at [facility]    Culture   Final    NO GROWTH 3 DAYS Performed at [facility]    Report Status 09/02/2014 FINAL  Final  AFB culture with smear     Status: None (Preliminary result)   Collection Time: 08/30/14  1:27 PM  Result Value Ref Range Status   Specimen Description FLUID ABDOMEN ASCITIC  Final   Special Requests NONE  Final   Acid Fast Smear   Final    NO ACID FAST BACILLI SEEN Performed at [facility]    Culture   Final    CULTURE WILL BE EXAMINED FOR 6 WEEKS BEFORE ISSUING A FINAL REPORT Performed at [facility]    Report Status PENDING  Incomplete  Body fluid culture     Status: None   Collection Time: 09/02/14  1:44 PM  Result Value Ref Range Status   Specimen Description PERITONEAL FLUID  Final   Special Requests NONE  Final   Gram Stain   Final    RARE WBC PRESENT,BOTH PMN AND MONONUCLEAR NO ORGANISMS SEEN Performed at [facility]    Culture   Final    NO GROWTH 3 DAYS Performed at [facility]    Report Status 09/05/2014 FINAL  Final    Studies: Ct Angio Chest Pe W/cm &/or Wo Cm 09/05/2014 1. Cardiomegaly with pulmonary vascular congestion and slight interstitial edema consistent with congestive heart failure. 2. No acute pulmonary emboli. One tiny linear old embolus in a branch of the  right pulmonary artery to the middle lobe. 3. Ascites.  Hepatomegaly.   Ir Venogram Hepatic W Hemodynamic Evaluation 09/05/2014 1. Patent right hepatic vein.  No evidence of Budd-Chiari. 2. Markedly elevated right heart and free hepatic venous pressures likely secondary to right heart failure. 3. Negative for evidence of portal hypertension. The portosystemic gradient ranged from 3- 8 mmHg which is within normal limits. 4. Successful transjugular random liver biopsy.   Ir Transcatheter Bx 09/05/2014 1. Patent right hepatic vein.  No evidence of Budd-Chiari. 2. Markedly elevated right heart and free hepatic venous pressures likely secondary to right heart failure. 3. Negative for evidence of portal hypertension. The portosystemic gradient ranged from 3- 8 mmHg which is within normal limits. 4. Successful transjugular random liver biopsy. Signed,  Sterling Big, MD  [facility]  [facility]   Electronically Signed   By: Malachy Moan M.D.   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 There is significant biliary duct proliferation. There is diffuse severe hepatic degeneration and abundant Mallory hyaline deposition.  It is difficult to assign an etiology in this case, due to the significant findings of cirrhosis. However, the significant biliary proliferation raises the possibility of a primary biliary disorder  Ir US Guide Vasc Access Right 09/05/2014 1. Patent right hepatic vein.  No evidence of Budd-Chiari. 2. Markedly elevated right heart and free hepatic venous pressures likely secondary to right heart failure. 3. Negative for evidence of portal hypertension. The portosystemic gradient ranged from 3- 8 mmHg which is within normal limits. 4. Successful transjugular random liver biopsy.   Dg Chest Port 1 View 09/05/2014 Stable  pulmonary scarring and interstitial lung disease. Stable cardiomegaly and mediastinal lymphadenopathy.  Scheduled Meds: . antiseptic oral rinse  7 mL Mouth Rinse BID  . aspirin EC  81 mg Oral Daily  . feeding supplement (NEPRO CARB  STEADY)  237 mL Oral Daily  . multivitamin  1 tablet Oral QHS  . senna-docusate  2 tablet Oral BID  . sevelamer carbonate  3,200 mg Oral TID WC  . sodium chloride  3 mL Intravenous Q12H  . sodium chloride  3 mL Intravenous Q12H  . triamcinolone ointment  1 application Topical Daily   Continuous Infusions:   Principal Problem:   Acute on chronic combined systolic and diastolic heart failure Active Problems:   Obstructive sleep apnea   Essential hypertension   ESRD on hemodialysis   Fever of undetermined origin   DCM- EF 20-25% by echo 09/04/14   Ascites   Chronic atrial fibrillation   Cirrhosis   RVF (right ventricular failure)   Abdominal distension   ESRD (end stage renal disease) on dialysis  Nethaniel Mattie M. Elvera Lennox, MD [facility] [phone]   If 7PM-7AM, please contact night-coverage [email or web address] Password [email or web address] 09/08/2014, 10:33 AM    LOS: 9 days

## 2014-09-09 ENCOUNTER — Encounter (HOSPITAL_COMMUNITY): Payer: Self-pay | Admitting: Internal Medicine

## 2014-09-09 DIAGNOSIS — Z515 Encounter for palliative care: Secondary | ICD-10-CM

## 2014-09-09 LAB — CBC
HEMATOCRIT: 42.4 % (ref 39.0–52.0)
Hemoglobin: 14.7 g/dL (ref 13.0–17.0)
MCH: 28.7 pg (ref 26.0–34.0)
MCHC: 34.7 g/dL (ref 30.0–36.0)
MCV: 82.7 fL (ref 78.0–100.0)
PLATELETS: 106 10*3/uL — AB (ref 150–400)
RBC: 5.13 MIL/uL (ref 4.22–5.81)
RDW: 18.4 % — ABNORMAL HIGH (ref 11.5–15.5)
WBC: 4.5 10*3/uL (ref 4.0–10.5)

## 2014-09-09 LAB — BASIC METABOLIC PANEL
Anion gap: 13 (ref 5–15)
BUN: 45 mg/dL — ABNORMAL HIGH (ref 6–23)
CALCIUM: 8.6 mg/dL (ref 8.4–10.5)
CHLORIDE: 96 meq/L (ref 96–112)
CO2: 26 mmol/L (ref 19–32)
Creatinine, Ser: 5.9 mg/dL — ABNORMAL HIGH (ref 0.50–1.35)
GFR calc Af Amer: 11 mL/min — ABNORMAL LOW (ref >90)
GFR, EST NON AFRICAN AMERICAN: 9 mL/min — AB (ref >90)
Glucose, Bld: 83 mg/dL (ref 70–99)
Potassium: 4.2 mmol/L (ref 3.5–5.1)
SODIUM: 135 mmol/L (ref 135–145)

## 2014-09-09 MED ORDER — GUAIFENESIN-CODEINE 100-10 MG/5ML PO SOLN
5.0000 mL | ORAL | Status: DC | PRN
  Administered 2014-09-09 – 2014-09-11 (×6): 5 mL via ORAL
  Filled 2014-09-09 (×5): qty 5

## 2014-09-09 MED ORDER — GUAIFENESIN-CODEINE 100-10 MG/5ML PO SOLN
ORAL | Status: AC
  Administered 2014-09-10: 5 mL via ORAL
  Filled 2014-09-09: qty 5

## 2014-09-09 MED ORDER — IPRATROPIUM BROMIDE 0.06 % NA SOLN
2.0000 | Freq: Three times a day (TID) | NASAL | Status: DC
  Administered 2014-09-09 – 2014-09-10 (×2): 2 via NASAL
  Filled 2014-09-09 (×2): qty 30

## 2014-09-09 MED ORDER — FLUTICASONE PROPIONATE 50 MCG/ACT NA SUSP
2.0000 | Freq: Every day | NASAL | Status: DC
  Administered 2014-09-09 – 2014-09-10 (×2): 2 via NASAL
  Filled 2014-09-09: qty 16

## 2014-09-09 MED ORDER — LORATADINE 10 MG PO TABS
10.0000 mg | ORAL_TABLET | Freq: Every day | ORAL | Status: DC
  Administered 2014-09-09 – 2014-09-12 (×4): 10 mg via ORAL
  Filled 2014-09-09 (×4): qty 1

## 2014-09-09 MED ORDER — MAGIC MOUTHWASH W/LIDOCAINE
15.0000 mL | Freq: Four times a day (QID) | ORAL | Status: DC
  Administered 2014-09-09 – 2014-09-11 (×7): 15 mL via ORAL
  Filled 2014-09-09 (×15): qty 15

## 2014-09-09 NOTE — Evaluation (Signed)
Physical Therapy Evaluation Patient Details Name: Trevor [name] MRN: [id number] DOB: Feb 22, 1951 Today's Date: 09/09/2014   History of Present Illness  64 yo male with new abd pain from ascites, non EtOH cirrhosis, HD for management of liver.  PMHx:  gout, a-fib, CAD, CHF, PVD, MI  Clinical Impression  Pt was evaluated for further care with rehab and noted his change in gait today.  He is definitely having some unsteady gait that could lead to a fall and will benefit from a short stay if insurance will agree to it.  His plan is to go if he can qualify.    Follow Up Recommendations SNF;Supervision/Assistance - 24 hour    Equipment Recommendations  Rolling walker with 5" wheels    Recommendations for Other Services       Precautions / Restrictions Precautions Precautions: Fall Restrictions Weight Bearing Restrictions: No      Mobility  Bed Mobility Overal bed mobility: Needs Assistance Bed Mobility: Supine to Sit     Supine to sit: Supervision     General bed mobility comments: using bedrails and HOB elevated  Transfers Overall transfer level: Needs assistance Equipment used: Rolling walker (2 wheeled) Transfers: Sit to/from [employer] Sit to Stand: Supervision Stand pivot transfers: Min guard       General transfer comment: cues for safety and to sequence hand placement  Ambulation/Gait Ambulation/Gait assistance: Min assist;Supervision (depending on whether with walker or not) Ambulation Distance (Feet): 200 Feet Assistive device: Rolling walker (2 wheeled);1 person hand held assist (both versions) Gait Pattern/deviations: Scissoring;Decreased step length - right;Decreased step length - left;Step-through pattern;Decreased stride length;Drifts right/left;Trunk flexed;Narrow base of support Gait velocity: reduced wtih SOB Gait velocity interpretation: Below normal speed for age/gender General Gait Details: walked both HHA and with RW, and gait much  more controlled with RW.  Both versions require some assistance  Stairs            Wheelchair Mobility    Modified Rankin (Stroke Patients Only)       Balance Overall balance assessment: Needs assistance Sitting-balance support: Feet supported Sitting balance-Leahy Scale: Good   Postural control: Posterior lean Standing balance support: Bilateral upper extremity supported;No upper extremity supported (Tried both) Standing balance-Leahy Scale: Fair (dynamic poor+)                               Pertinent Vitals/Pain Pain Assessment: No/denies pain    Home Living Family/patient expects to be discharged to:: Private residence Living Arrangements: Spouse/significant other Available Help at Discharge: Family;Available PRN/intermittently Type of Home: House Home Access: Ramped entrance     Home Layout: One level Home Equipment: None      Prior Function Level of Independence: Independent               Hand Dominance        Extremity/Trunk Assessment   Upper Extremity Assessment: Overall WFL for tasks assessed           Lower Extremity Assessment: Generalized weakness      Cervical / Trunk Assessment: Normal  Communication   Communication: No difficulties  Cognition Arousal/Alertness: Awake/alert Behavior During Therapy: WFL for tasks assessed/performed Overall Cognitive Status: Within Functional Limits for tasks assessed                      General Comments General comments (skin integrity, edema, etc.): Pt is demonstrating some difficulties with safety in  mobility and for now is struggling to walk distances safely.  Home alone during most of the time although his girlfriend is there at times.    Exercises        Assessment/Plan    PT Assessment Patient needs continued PT services  PT Diagnosis Difficulty walking;Generalized weakness   PT Problem List Decreased strength;Decreased range of motion;Decreased activity  tolerance;Decreased balance;Decreased mobility;Decreased coordination;Decreased knowledge of use of DME;Decreased safety awareness;Cardiopulmonary status limiting activity;Decreased skin integrity  PT Treatment Interventions DME instruction;Gait training;Functional mobility training;Therapeutic activities;Therapeutic exercise;Balance training;Neuromuscular re-education;Patient/family education   PT Goals (Current goals can be found in the Care Plan section) Acute Rehab PT Goals Patient Stated Goal: to walk alone and go home PT Goal Formulation: With patient Time For Goal Achievement: 09/23/14 Potential to Achieve Goals: Good    Frequency Min 3X/week   Barriers to discharge Inaccessible home environment ramped entrance and now needs a RW    Co-evaluation               End of Session Equipment Utilized During Treatment: Other (comment) (FWW) Activity Tolerance: Patient limited by fatigue Patient left: in chair;with call bell/phone within reach Nurse Communication: Precautions;Other (comment) (discharge planning)         Time: 7473-4037 PT Time Calculation (min) (ACUTE ONLY): 28 min   Charges:   PT Evaluation $Initial PT Evaluation Tier I: 1 Procedure PT Treatments $Gait Training: 8-22 mins   PT G Codes:        Ivar Drape September 16, 2014, 2:00 PM  Samul Dada, PT MS Acute Rehab Dept. Number: [phone]

## 2014-09-09 NOTE — Progress Notes (Signed)
Pt requesting scheduled nepro now.

## 2014-09-09 NOTE — Progress Notes (Signed)
[facility] Progress Note   Subjective: coughing, no other c/o's  Filed Vitals:   09/09/14 0800 09/09/14 0831 09/09/14 0856 09/09/14 0929  BP: 144/120 132/96 138/101 148/98  Pulse: 104 116 113 114  Temp:      TempSrc:      Resp: 19     Height:      Weight:      SpO2:       Exam: Looks stronger, up walking in room, occ cough No jvd , no LAN in neck Chest coarse bilat basilar scattered rales Irreg , no rub/ gallop Abd +ascites, improved, +BS, nontender 1+ bilat pretib edema Neuro is nf, Ox 3, no flap  CXR 12/31 - improved and stable from 12/29, no gross edema, some chronic perihilar changes, no edema HD: MWF [name] 4h F200 500/800 97kg 2/2.0 [place] Heparin 3200 LUA AVF [name] 3 ug TIW       Assessment: 1. Dyspnea / cough / pulm edema - much better, volume down 15 kg from admit, CXR back to baseline 2. Cirrhosis / ESLD - by liver bx, etiology unknown 3. ESRD on HD MWF 4. Anemia Hb 14, no esa 5. MBD cont vit D, renvela 6. HTN holding labetalol d/t vol reduction 7. Volume - as above, still has some LE edema 8. Hx PE/ DVT Dec 2014 9. Afib - no AC d/t bleeding hx 10. Hyponatremia - d/t vol overload, resolved  Plan- HD today, ok for dc from renal standpoint after HD today. New lower dry wt     [name] MD  pager [phone]    cell [phone]  09/09/2014, 9:36 AM     Recent Labs Lab 09/04/14 1519 09/05/14 0713  09/06/14 1505 09/07/14 0527 09/08/14 0650 09/09/14 0403  NA 134* 134*  < > 135 138 138 135  K 4.9 4.7  < > 4.2 4.0 3.9 4.2  CL 97 98  < > 98 97 100 96  CO2 24 24  < > GLUCOSE 83 89  < > 95 78 83 83  BUN 49* 38*  < > 45* 32* 27* 45*  CREATININE 6.94* 5.91*  < > 6.30* 5.12* 4.71* 5.90*  CALCIUM 8.6 8.6  < > 8.6 8.6 8.8 8.6  PHOS 5.2* 4.5  --  4.2  --   --   --   < > = values in this interval not displayed.  Recent Labs Lab 09/05/14 1700 09/06/14 1505 09/07/14 0527 09/08/14 0650  AST 50*   --  54* 53*  ALT 26  --  30 30  ALKPHOS 220*  --  218* 227*  BILITOT 2.7*  --  2.6* 3.1*  PROT 7.0  --  7.2 8.4*  ALBUMIN 2.6* 2.6* 2.7* 2.6*    Recent Labs Lab 09/07/14 0527 09/08/14 0650 09/09/14 0403  WBC 4.3 4.3 4.5  HGB 13.8 13.8 14.7  HCT 40.2 39.8 42.4  MCV 82.7 82.2 82.7  PLT 97* 107* 106*   . antiseptic oral rinse  7 mL Mouth Rinse BID  . aspirin EC  81 mg Oral Daily  . feeding supplement (NEPRO CARB STEADY)  237 mL Oral Daily  . guaiFENesin-codeine      . multivitamin  1 tablet Oral QHS  . senna-docusate  2 tablet Oral BID  . sevelamer carbonate  3,200 mg Oral TID WC  . sodium chloride  3 mL Intravenous Q12H  . sodium chloride  3 mL Intravenous Q12H  .  triamcinolone ointment  1 application Topical Daily     sodium chloride, acetaminophen **OR** acetaminophen, albuterol, guaiFENesin-codeine, hydrOXYzine, morphine injection, ondansetron **OR** ondansetron (ZOFRAN) IV, oxyCODONE, phenol, sevelamer carbonate, sevelamer carbonate, sodium chloride

## 2014-09-09 NOTE — Plan of Care (Signed)
Problem: Acute Rehab PT Goals(only PT should resolve) Goal: Pt Will Transfer Bed To Chair/Chair To Bed On LRAD Goal: Pt Will Ambulate Level surfaces     

## 2014-09-09 NOTE — Procedures (Signed)
I was present at this dialysis session, have reviewed the session itself and made  appropriate changes  [name] MD (pgr) [phone]    ([phone] 09/09/2014, 9:55 AM

## 2014-09-09 NOTE — Progress Notes (Signed)
PROGRESS NOTE    CHIA ROCK BMW:[id number] DOB: 1950-12-23 DOA: 08/30/2014 PCP: [name], MD  HPI/Brief narrative 64 year old male patient with history of ESRD on HD MWF, chronic systolic CHF, A. 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  He had dyspnea secondary to abdominal distention. He had vigorous coughing spells and passed out. He saw his cardiologist and was advised to come to the hospital. He underwent diagnostic and therapeutic paracentesis by IR, twice. Nephrology consulting as he is receiving daily HD during this hospitalization. GI consulted and recommended transjugular liver biopsy which was completed on 12/29. Cardiology consulting for biventricular heart failure.  Subjective: - Seen in dialysis today, he is feeling better, denies any shortness of breath  Assessment/Plan:  Massive ascites with new diagnosis of Cirrhosis:  Ascites improved with HD - Multifactorial, possibly from Cirrhosis as well as congestive hepatopathy from RHF.  - Status post 1.2 L and 1.7 L paracentesis by IR. No SBP.  - Receiving daily hemodialysis to control the fluid, last hemodialysis 1/2, then he will resume his prior Monday Wednesday and Friday - GI suggests cirrhosis with portal hypertension accounting for ascites and splenomegaly.  - s/p transjugular liver biopsy 12/29-results indicate cirrhosis - possibly of primary biliary source.    - Patient fairly functional prior to being hospitalized now, I think he will benefit from inpatient rehabilitation, I placed the consult for evaluation for CIR  Acute on chronic biventricular failure, predominantly right heart failure: Volume overloaded-management across dialysis. 2-D echo 12/28: Moderate LVH, LVEF 25-30 percent, diffuse hypokinesis. Akinesis of anteroseptal myocardium. PA  peak pressure 66 mmHg. Cardiology input appreciated: Continued hemodialysis to avoid acute left heart failure,  CTA chest negative for recurrent PE.   Chronic cough with odynaphagia:  Patient indicates his pain is in his throat.  - Patient has been arranged an outpatient ENT follow-up on 1/7  ESRD on MWF HD: Management per nephrology.  - new dry weight 84 kg  Syncope: Possibly cough syncope. Initial troponin minimally elevated which could be secondary to renal failure but subsequent troponins have been negative. No clinical pneumonia.  History of chronic A. fib: Rate controlled in the 90s. Warfarin anticoagulation discontinued secondary to spontaneous back hematoma and patient has declined anticoagulation since.  History of essential hypertension: Mildly uncontrolled. Labetalol discontinued to allow blood pressure for dialysis. - continue to monitor, doing well off antihypertensives  History of DVT/PE: Previously on Coumadin-discontinued secondary to spontaneous back hematoma and patient has declined anticoagulation since.  Leukopenia and thrombocytopenia: Secondary to liver disease. Follow CBCs. Stable.? Hypersplenism.  Palliative Medicine:  Patient with failure of 3 organ systems.   Discussed code status again on 12/31 and 1/1, remaines full code - Appreciate palliative medicine consult    Code Status: Full  Family Communication: discussed with his son, [name] [phone] on 09/08/14 and friend, Misty Stanley [phone] 1/2 (he is working to getting Misty Stanley to be his POA) Disposition Plan: Home when medically stable  Consultants:  Nephrology  Gastroenterology  Cardiology  Interventional radiology.  Procedures:  Paracentesis 1.2 L by IR on 12/23 and 1.7 L on 12/26  Hemodialysis  Transjugular liver biopsy 12/29  Antibiotics:  None   Objective: Filed Vitals:   09/09/14 1030 09/09/14 1054 09/09/14 1130 09/09/14 1300  BP: 129/68 110/78 123/84 142/86  Pulse: 96 82 91 56    Temp:   97.8  F (36.6 C) 99.6 F (37.6 C)  TempSrc:   Oral Oral  Resp:   15 18  Height:      Weight:   84.4 kg (186 lb 1.1 oz)   SpO2:   94% 98%    Intake/Output Summary (Last 24 hours) at 09/09/14 1435 Last data filed at 09/09/14 1130  Gross per 24 hour  Intake    247 ml  Output   3502 ml  Net  -3255 ml   Filed Weights   09/09/14 0500 09/09/14 0753 09/09/14 1130  Weight: 87.5 kg (192 lb 14.4 oz) 85.3 kg (188 lb 0.8 oz) 84.4 kg (186 lb 1.1 oz)   Exam: General exam: Poorly nourished, moderately built male Respiratory system: bibasilar crackles, no wheezing Cardiovascular system: S1 & S2 heard, irregularly irregular; trace LE edema Gastrointestinal system: Abdomen is moderately distended(decreased), soft and nontender.  Normal bowel sounds heard. +Periumbilical hernia. Central nervous system: Alert and oriented. No focal neurological deficits. Skin: no rashes Extremities: Symmetric 5 x 5 power.  Data Reviewed: Basic Metabolic Panel:  Recent Labs Lab 09/03/14 0805 09/04/14 1519 09/05/14 0713 09/05/14 1700 09/06/14 1505 09/07/14 0527 09/08/14 0650 09/09/14 0403  NA 134* 134* 134* 139 135 138 138 135  K 5.2* 4.9 4.7 4.4 4.2 4.0 3.9 4.2  CL 97 97 98 100 98 97 100 96  CO2 20 24 24 26 26 28 29 26   GLUCOSE 89 83 89 96 95 78 83 83  BUN 58* 49* 38* 27* 45* 32* 27* 45*  CREATININE 8.08* 6.94* 5.91* 4.89* 6.30* 5.12* 4.71* 5.90*  CALCIUM 8.5 8.6 8.6 8.8 8.6 8.6 8.8 8.6  PHOS 6.2* 5.2* 4.5  --  4.2  --   --   --    Liver Function Tests:  Recent Labs Lab 09/05/14 0713 09/05/14 1700 09/06/14 1505 09/07/14 0527 09/08/14 0650  AST  --  50*  --  54* 53*  ALT  --  26  --  30 30  ALKPHOS  --  220*  --  218* 227*  BILITOT  --  2.7*  --  2.6* 3.1*  PROT  --  7.0  --  7.2 8.4*  ALBUMIN 2.6* 2.6* 2.6* 2.7* 2.6*   CBC:  Recent Labs Lab 09/05/14 1700 09/06/14 1505 09/07/14 0527 09/08/14 0650 09/09/14 0403  WBC 4.7 4.6 4.3 4.3 4.5  HGB 14.6 13.2 13.8 13.8 14.7   HCT 42.2 37.8* 40.2 39.8 42.4  MCV 82.9 79.9 82.7 82.2 82.7  PLT 97* 98* 97* 107* 106*   BNP (last 3 results)  Recent Labs  08/29/14 1421  PROBNP 1830.0*    Recent Results (from the past 240 hour(s))  Body fluid culture     Status: None   Collection Time: 09/02/14  1:44 PM  Result Value Ref Range Status   Specimen Description PERITONEAL FLUID  Final   Special Requests NONE  Final   Gram Stain   Final    RARE WBC PRESENT,BOTH PMN AND MONONUCLEAR NO ORGANISMS SEEN Performed at [facility]    Culture   Final    NO GROWTH 3 DAYS Performed at [facility]    Report Status 09/05/2014 FINAL  Final    Studies: Ct Angio Chest Pe W/cm &/or Wo Cm 09/05/2014 1. Cardiomegaly with pulmonary vascular congestion and slight interstitial edema consistent with congestive heart failure. 2. No acute pulmonary emboli. One tiny linear old embolus in a branch of the right pulmonary artery to the middle lobe.  3. Ascites.  Hepatomegaly.   Ir Venogram Hepatic W Hemodynamic Evaluation 09/05/2014 1. Patent right hepatic vein.  No evidence of Budd-Chiari. 2. Markedly elevated right heart and free hepatic venous pressures likely secondary to right heart failure. 3. Negative for evidence of portal hypertension. The portosystemic gradient ranged from 3- 8 mmHg which is within normal limits. 4. Successful transjugular random liver biopsy.   Ir Transcatheter Bx 09/05/2014 1. Patent right hepatic vein.  No evidence of Budd-Chiari. 2. Markedly elevated right heart and free hepatic venous pressures likely secondary to right heart failure. 3. Negative for evidence of portal hypertension. The portosystemic gradient ranged from 3- 8 mmHg which is within normal limits. 4. Successful transjugular random liver biopsy. Signed,  Sterling Big, MD  [facility]  [facility]   Electronically Signed   By: Malachy Moan M.D.   On: 09/05/2014 17:24    Pathology from Liver Biopsy Histologic examination reveals cirrhosis with portal-to-portal bridging and fibrous expansion of portal tracts. There is significant biliary duct proliferation. There is diffuse severe hepatic degeneration and abundant Mallory hyaline deposition.  It is difficult to assign an etiology in this case, due to the significant findings of cirrhosis. However, the significant biliary proliferation raises the possibility of a primary biliary disorder  Ir US Guide Vasc Access Right 09/05/2014 1. Patent right hepatic vein.  No evidence of Budd-Chiari. 2. Markedly elevated right heart and free hepatic venous pressures likely secondary to right heart failure. 3. Negative for evidence of portal hypertension. The portosystemic gradient ranged from 3- 8 mmHg which is within normal limits. 4. Successful transjugular random liver biopsy.   Dg Chest Port 1 View 09/05/2014 Stable pulmonary scarring and interstitial lung disease. Stable cardiomegaly and mediastinal lymphadenopathy.  Scheduled Meds: . antiseptic oral rinse  7 mL Mouth Rinse BID  . aspirin EC  81 mg Oral Daily  . feeding supplement (NEPRO CARB STEADY)  237 mL Oral Daily  . fluticasone  2 spray Each Nare Daily  . guaiFENesin-codeine      . ipratropium  2 spray Each Nare TID  . loratadine  10 mg Oral Daily  . magic mouthwash w/lidocaine  15 mL Oral QID  . multivitamin  1 tablet Oral QHS  . senna-docusate  2 tablet Oral BID  . sevelamer carbonate  3,200 mg Oral TID WC  . sodium chloride  3 mL Intravenous Q12H  . sodium chloride  3 mL Intravenous Q12H  . triamcinolone ointment  1 application Topical Daily   Continuous Infusions:   Principal Problem:   Acute on chronic combined systolic and diastolic heart failure Active Problems:   Obstructive sleep apnea   Essential hypertension   ESRD on hemodialysis   Fever of undetermined origin   DCM- EF 20-25% by echo 09/04/14   Ascites   Chronic atrial fibrillation    Cirrhosis   RVF (right ventricular failure)   Abdominal distension   ESRD (end stage renal disease) on dialysis  Costin M. Elvera Lennox, MD [facility] [phone]   If 7PM-7AM, please contact night-coverage [email or web address] Password [email or web address] 09/09/2014, 2:35 PM    LOS: 10 days

## 2014-09-09 NOTE — Consult Note (Signed)
Palliative Medicine Team at [facility]  Date: 09/09/2014   Patient Name: Trevor [name]  DOB: 04-07-1951  MRN: [id number]  Age / Sex: 64 y.o., male   PCP: Dorothyann Peng, MD Referring Physician: Leatha Gilding, MD  Active Problems: Principal Problem:   Acute on chronic combined systolic and diastolic heart failure Active Problems:   Obstructive sleep apnea   Essential hypertension   ESRD on hemodialysis   Fever of undetermined origin   DCM- EF 20-25% by echo 09/04/14   Ascites   Chronic atrial fibrillation   Cirrhosis   RVF (right ventricular failure)   Abdominal distension   ESRD (end stage renal disease) on dialysis   HPI/Reason for Consultation: [name] is a 64 y.o. male with end stage liver disease, ESRD on HD and CHF who given his multiple chronic diseases remains independent and strong. He is ambulating in his room he is mentally very sharp and manages his medications himself. He was admitted with volume overload and for paracentesis. PMT consulted for goals of care and SM.  Participants in Discussion: No HCPOA. Patient with full capacity.   Advance Directive: No information given   Code Status Orders        Start     Ordered   09/05/14 1703  Full code   Continuous     09/05/14 1702      I have reviewed the medical record, interviewed the patient and family, and examined the patient. The following aspects are pertinent.  Past Medical History  Diagnosis Date  . Anemia   . AVF (arteriovenous fistula)     Left  . Secondary hyperparathyroidism   . Hypovitaminosis D   . Hypertensive urgency     H/o  . CHF (congestive heart failure)   . Exertional shortness of breath     "related to infection in my lungs right now" (05/03/2013)  . History of gout     "before I started doing the dialysis" (05/03/2013)  . Sleep apnea   . GERD (gastroesophageal reflux disease)   . Syncope     felt secondary to residual anesthesia the day before - 2D echo unremarkable  .  Pulmonary embolism     with right DVT secondary to recent surgery  . Atrial fibrillation     not on coumadin due to large spontaneous hematoma on back and anemia  . Myocardial infarction 90's  . Coronary artery disease   . Dysrhythmia     afib  . Peripheral vascular disease     dvt leg 12/14  . Hypertension   . ESRD (end stage renal disease) on dialysis     [facility] mon/wed/fri   History   Social History  . Marital Status: Single    Spouse Name: N/A    Number of Children: 4  . Years of Education: N/A   Occupational History  . Disabled    Social History Main Topics  . Smoking status: Former Smoker -- 0.25 packs/day for .5 years    Types: Cigarettes    Quit date: 09/08/1972  . Smokeless tobacco: Never Used  . Alcohol Use: Yes     Comment: 05/03/2013 "haven't had a glass of wine in ~ 3 months or so; sometimes will have one w/dinner"  . Drug Use: No  . Sexual Activity: Yes   Other Topics Concern  . None   Social History Narrative   Former smoker- quit over 30 yrs ago   Patient is on disability   Family  History  Problem Relation Age of Onset  . Hypertension Father   . Kidney disease Father   . Allergies Father   . Deep vein thrombosis Sister   . Pulmonary embolism Sister   . Diabetes Paternal Grandmother    Scheduled Meds: . antiseptic oral rinse  7 mL Mouth Rinse BID  . aspirin EC  81 mg Oral Daily  . feeding supplement (NEPRO CARB STEADY)  237 mL Oral Daily  . fluticasone  2 spray Each Nare Daily  . guaiFENesin-codeine      . loratadine  10 mg Oral Daily  . magic mouthwash w/lidocaine  15 mL Oral QID  . multivitamin  1 tablet Oral QHS  . senna-docusate  2 tablet Oral BID  . sevelamer carbonate  3,200 mg Oral TID WC  . sodium chloride  3 mL Intravenous Q12H  . sodium chloride  3 mL Intravenous Q12H  . triamcinolone ointment  1 application Topical Daily   Continuous Infusions:  PRN Meds:.sodium chloride, acetaminophen **OR** acetaminophen, albuterol,  guaiFENesin-codeine, hydrOXYzine, morphine injection, ondansetron **OR** ondansetron (ZOFRAN) IV, oxyCODONE, phenol, sevelamer carbonate, sevelamer carbonate, sodium chloride Allergies  Allergen Reactions  . Pork-Derived Products Nausea And Vomiting    Culture   CBC:    Component Value Date/Time   WBC 4.5 09/09/2014 0403   WBC 2.7* 10/25/2009 0933   HGB 14.7 09/09/2014 0403   HGB 9.9* 10/25/2009 0933   HCT 42.4 09/09/2014 0403   HCT 30.4* 10/25/2009 0933   PLT 106* 09/09/2014 0403   PLT 148 10/25/2009 0933   MCV 82.7 09/09/2014 0403   MCV 78.1* 10/25/2009 0933   NEUTROABS 1.5* 08/30/2014 0402   NEUTROABS 1.4* 10/25/2009 0933   LYMPHSABS 0.5* 08/30/2014 0402   LYMPHSABS 0.7* 10/25/2009 0933   MONOABS 0.3 08/30/2014 0402   MONOABS 0.5 10/25/2009 0933   EOSABS 0.1 08/30/2014 0402   EOSABS 0.2 10/25/2009 0933   BASOSABS 0.0 08/30/2014 0402   BASOSABS 0.0 10/25/2009 0933   Comprehensive Metabolic Panel:    Component Value Date/Time   NA 135 09/09/2014 0403   K 4.2 09/09/2014 0403   CL 96 09/09/2014 0403   CO2 26 09/09/2014 0403   BUN 45* 09/09/2014 0403   CREATININE 5.90* 09/09/2014 0403   GLUCOSE 83 09/09/2014 0403   CALCIUM 8.6 09/09/2014 0403   CALCIUM 10.3 04/05/2010 1337   AST 53* 09/08/2014 0650   ALT 30 09/08/2014 0650   ALKPHOS 227* 09/08/2014 0650   BILITOT 3.1* 09/08/2014 0650   PROT 8.4* 09/08/2014 0650   ALBUMIN 2.6* 09/08/2014 0650    Vital Signs: BP 142/86 mmHg  Pulse 56  Temp(Src) 99.6 F (37.6 C) (Oral)  Resp 18  Ht  (1.854 m)  Wt 84.4 kg (186 lb 1.1 oz)  BMI 24.55 kg/m2  SpO2 98% Filed Weights   09/09/14 0500 09/09/14 0753 09/09/14 1130  Weight: 87.5 kg (192 lb 14.4 oz) 85.3 kg (188 lb 0.8 oz) 84.4 kg (186 lb 1.1 oz)   01/01 0701 - 01/02 0700 In: 367 [P.O.:367] Out: -   Physical Exam:  AOX3, cooperative, ambulatory +cough, constantly clears his throat Skin rash on his back, areas of excoriation RRR Abdomen distended but  soft LE +edema Poor dentition, throat is very red and inflammed, painful to palpation externally around thyroid cartilage,  Mild adenopathy  Summary of Established Goals of Care and Medical Treatment Preferences  [name] will need to establish a trusting relationship with a healthcare provider before he will be able  to move forward with discussion about advance directive or code status-he seemed very nervous about having a conversation so I just provided information in our team packet including the Hard Choices book. I encouraged him to read it and let him no there was no pressure. I get the sense that he is just very private about his matters.   I explain the role of palliative in his care- he is very interested in getting help managing his symptoms that are really what he considers the worst part of his illness- pain and persistent cough, itching among other issues.  Since starting HD in 2010 he has no longer been seen in the [facility] and most of his primary care is being provided through nephrology and several other subspecialty providers.  Patient Active Problem List   Diagnosis Date Noted  . Abdominal distension   . ESRD (end stage renal disease) on dialysis   . Acute on chronic combined systolic and diastolic heart failure   . Cirrhosis   . RVF (right ventricular failure)   . Chronic atrial fibrillation   . Abdominal pain, chronic, epigastric 08/31/2014  . Ascites 08/30/2014  . Cough syncope 08/30/2014  . Thrombocytopenia 08/30/2014  . Leukopenia 08/30/2014  . Abnormal LFTs 08/30/2014  . Pedal edema 08/30/2014  . S/P repair of ventral hernia 03/21/2014  . DCM- EF 20-25% by echo 09/04/14 03/01/2014  . Coronary artery calcification seen on CAT scan 03/01/2014  . Mechanical complication of other vascular device, implant, and graft 11/24/2013  . Abnormal EKG 09/12/2013  . Hematoma complicating a procedure 09/02/2013  . Fever of undetermined origin 09/01/2013  .  Viral syndrome 09/01/2013  . Cellulitis and abscess of leg 08/15/2013  . Cellulitis 08/15/2013  . Pulmonary embolism   . Hepatomegaly 07/15/2013  . DVT of lower extremity (deep venous thrombosis) RLE 06/29/2013  . GERD (gastroesophageal reflux disease)   . Anemia   . Cough 05/17/2013  . Hemoptysis 05/17/2013  . Fever, unspecified 05/04/2013  . ESRD on hemodialysis 05/03/2013  . Syncope 04/27/2013  . Atrial flutter 04/27/2013  . Obstructive sleep apnea 05/02/2010  . ALLERGIC RHINITIS 03/07/2008  . DISORDERS OF PHOSPHORUS METABOLISM 02/24/2008  . HYPERCALCEMIA 02/24/2008  . MYALGIA 02/23/2008  . UNSPECIFIED VITAMIN DEFICIENCY 12/22/2007  . ANEMIA, IRON DEFICIENCY 12/22/2007  . Essential hypertension 12/22/2007  . SECONDARY HYPERPARATHYROIDISM 12/22/2007    Psycho-social/Spiritual:  Barrington Ellison and supportive family, several sisters at bedside. He expresses appreciation for the information on palliative.  Prognosis: Difficult to determine. <6 months most likely. Patient did not want information on prognosis or disease trajectory.   Palliative Performance Scale: 50%  Recommendations:  1. Remains Full Code, considering options 2. Symptom Management:  Cough: appears post nasal and his throat is very inflamed has a moderate amount of clear mucous and clears his throat frequently, he also has a stridor quality to his breathing and his voice is very hoarse. I would recommend an outpatient palliative focused ENT evaluation. He may have a throat infection-he says his throat is very sore and have been for several weeks.  Increased Cough medicine Robitussion- Codeine to q4 PRN  Added on Flonase and Ipratropium for vasomotor Rhinitis and possible allergic Rhinitis/PND, also scheduled daily claritin- he already takes benedry regularly for itching realted to his liver disease  Added  Magic mouthwash with lidocaine- he may actually do well with oral hydrocortisone solution swish and swallow for  the inflammation. Will follow.  Pain- continue oxycodone q4PRN for pain  3. Disposition:  He is ambulating in the room and doing quite well- he will even with rehab always be a fall risk independently just because of his medical problems. I am not sure SNF is a reasonable option for this active and young gentleman- he may do better with short term CIR with plan to go home. HD will limit his hospice options unfortunately.  Time I: 1:30-2:40 Time Total: 70 minutes Greater than 50%  of this time was spent counseling and coordinating care related to the above assessment and plan.  Signed by: Hilbert Odor, DO  09/09/2014, 2:01 PM  Please contact Palliative Medicine Team phone at [phone] for questions and concerns.

## 2014-09-10 LAB — BASIC METABOLIC PANEL
Anion gap: 14 (ref 5–15)
BUN: 42 mg/dL — ABNORMAL HIGH (ref 6–23)
CALCIUM: 8.7 mg/dL (ref 8.4–10.5)
CHLORIDE: 99 meq/L (ref 96–112)
CO2: 23 mmol/L (ref 19–32)
CREATININE: 5.57 mg/dL — AB (ref 0.50–1.35)
GFR calc Af Amer: 12 mL/min — ABNORMAL LOW (ref >90)
GFR, EST NON AFRICAN AMERICAN: 10 mL/min — AB (ref >90)
Glucose, Bld: 85 mg/dL (ref 70–99)
POTASSIUM: 4.3 mmol/L (ref 3.5–5.1)
Sodium: 136 mmol/L (ref 135–145)

## 2014-09-10 MED ORDER — LABETALOL HCL 100 MG PO TABS
100.0000 mg | ORAL_TABLET | Freq: Two times a day (BID) | ORAL | Status: DC
  Administered 2014-09-10 – 2014-09-11 (×4): 100 mg via ORAL
  Filled 2014-09-10 (×6): qty 1

## 2014-09-10 MED ORDER — METOPROLOL TARTRATE 1 MG/ML IV SOLN
2.5000 mg | Freq: Once | INTRAVENOUS | Status: AC
  Administered 2014-09-10: 2.5 mg via INTRAVENOUS
  Filled 2014-09-10: qty 5

## 2014-09-10 NOTE — Progress Notes (Signed)
Subjective:  Feeing better with HD yesterday/ Rehab eval in progress  with  His deconditioning   Objective Vital signs in last 24 hours: Filed Vitals:   09/10/14 0535 09/10/14 0549 09/10/14 0601 09/10/14 0652  BP: 136/114 150/113 138/110 131/96  Pulse: 120 127  91  Temp: 98.4 F (36.9 C)     TempSrc: Oral     Resp: 28     Height:      Weight:      SpO2: 96%   100%   Weight change: -2.2 kg (-4 lb 13.6 oz)  Physical Exam: General: alert sitting up in bed reading paper Heart: Ocas irreg. vr stable , 2/6 sem lsb, no rub or gallop Lungs: rare rales clearing from earlier Abdomen: BS pos, Ascites decr from admit, nontender Extremities:bipedal trace edema  Dialysis Access: pos. Bruit L UA AVF   CXR 12/31 - improved and stable from 12/29, no gross edema, some chronic perihilar changes, no edema HD: MWF [name] 4h F200 500/800 97kg 2/2.0 [place] Heparin 3200 LUA AVF [name] 3 ug TIW   Problem/Plan: 1. Dyspnea / cough / pulm edema -/ LHF and RHF, much better with HD and paracentesis, volume down 15 kg from admit 100.4 to 84.4 yest post hd/ CXR back to baseline 2. Cirrhosis / ESLD - by liver bx, etiology  cw Cirrhosis  3. ESRD on HD MWF- next hd in am 4. Anemia Hb 14.7, no esa 5. 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 HTN -low dose 100mg   labetalol / metoprolol for afib rate / if bp decr on hd may need to hold again 7. Volume - as above, still has some LE edema and mild ascites improved from admit 8. Hx PE/ DVT Dec 2014 9. Afib - no AC d/t bleeding hx 10. Hyponatremia - d/t vol overload, resolved 11. Deconditioning- lives at home with Daughter and girlfriend / Rehab seeing  for  In hosp rehab admit  Lenny Pastel, PA-C [facility] Beeper [phone] 09/10/2014,10:48 AM  LOS: 11 days    Pt seen, examined and agree w A/P as above. HD tomorrow, still has extra volume but getting close to dry wt most likely.  [name] MD pager  [phone]    cell [phone] 09/10/2014, 1:50 PM    Labs: Basic Metabolic Panel:  Recent Labs Lab 09/04/14 1519 09/05/14 0713  09/06/14 1505  09/08/14 0650 09/09/14 0403 09/10/14 0552  NA 134* 134*  < > 135  < > 138 135 136  K 4.9 4.7  < > 4.2  < > 3.9 4.2 4.3  CL 97 98  < > 98  < > 100 96 99  CO2 24 24  < > 26  < > 29 26 23   GLUCOSE 83 89  < > 95  < > 83 83 85  BUN 49* 38*  < > 45*  < > 27* 45* 42*  CREATININE 6.94* 5.91*  < > 6.30*  < > 4.71* 5.90* 5.57*  CALCIUM 8.6 8.6  < > 8.6  < > 8.8 8.6 8.7  PHOS 5.2* 4.5  --  4.2  --   --   --   --   < > = values in this interval not displayed. Liver Function Tests:  Recent Labs Lab 09/05/14 1700 09/06/14 1505 09/07/14 0527 09/08/14 0650  AST 50*  --  54* 53*  ALT 26  --  30 30  ALKPHOS 220*  --  218*  227*  BILITOT 2.7*  --  2.6* 3.1*  PROT 7.0  --  7.2 8.4*  ALBUMIN 2.6* 2.6* 2.7* 2.6*   CBC:  Recent Labs Lab 09/05/14 1700 09/06/14 1505 09/07/14 0527 09/08/14 0650 09/09/14 0403  WBC 4.7 4.6 4.3 4.3 4.5  HGB 14.6 13.2 13.8 13.8 14.7  HCT 42.2 37.8* 40.2 39.8 42.4  MCV 82.9 79.9 82.7 82.2 82.7  PLT 97* 98* 97* 107* 106*   Cardiac Enzymes: No results for input(s): CKTOTAL, CKMB, CKMBINDEX, TROPONINI in the last 168 hours. CBG: No results for input(s): GLUCAP in the last 168 hours.  Studies/Results: No results found. Medications:   . antiseptic oral rinse  7 mL Mouth Rinse BID  . aspirin EC  81 mg Oral Daily  . feeding supplement (NEPRO CARB STEADY)  237 mL Oral Daily  . fluticasone  2 spray Each Nare Daily  . ipratropium  2 spray Each Nare TID  . loratadine  10 mg Oral Daily  . magic mouthwash w/lidocaine  15 mL Oral QID  . multivitamin  1 tablet Oral QHS  . senna-docusate  2 tablet Oral BID  . sevelamer carbonate  3,200 mg Oral TID WC  . sodium chloride  3 mL Intravenous Q12H  . sodium chloride  3 mL Intravenous Q12H  . triamcinolone ointment  1 application Topical Daily

## 2014-09-10 NOTE — Progress Notes (Signed)
PROGRESS NOTE    Trevor [name] UEA:[id number] DOB: 26-Oct-1950 DOA: 08/30/2014 PCP: [name], MD  HPI/Brief narrative 64 year old male patient with history of ESRD on HD MWF, chronic systolic CHF, A. 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  He had dyspnea secondary to abdominal distention. He had vigorous coughing spells and passed out. He saw his cardiologist and was advised to come to the hospital. He underwent diagnostic and therapeutic paracentesis by IR, twice. Nephrology consulting as he is receiving daily HD during this hospitalization. GI consulted and recommended transjugular liver biopsy which was completed on 12/29. Cardiology consulting for biventricular heart failure.  Subjective: - appears comfortable, coughing  Assessment/Plan:  Massive ascites with new diagnosis of Cirrhosis:  Ascites improved with HD - Multifactorial, possibly from Cirrhosis as well as congestive hepatopathy from RHF.  - Status post 1.2 L and 1.7 L paracentesis by IR. No SBP.  - Receiving daily hemodialysis to control the fluid, last hemodialysis 1/2, then he will resume his prior Monday Wednesday and Friday - GI suggests cirrhosis with portal hypertension accounting for ascites and splenomegaly.  - s/p transjugular liver biopsy 12/29-results indicate cirrhosis - possibly of primary biliary source.    - Patient fairly functional prior to being hospitalized now, I think he will benefit from inpatient rehabilitation, I placed the consult for evaluation for CIR, hopefully he will be evaluated tomorrow.   Acute on chronic biventricular failure, predominantly right heart failure: Volume overloaded-management across dialysis. 2-D echo 12/28: Moderate LVH, LVEF 25-30 percent, diffuse hypokinesis. Akinesis of anteroseptal myocardium. PA peak  pressure 66 mmHg. Cardiology input appreciated: Continued hemodialysis to avoid acute left heart failure,  CTA chest negative for recurrent PE.   Chronic cough with odynaphagia:  Patient indicates his pain is in his throat.  - Patient has been arranged an outpatient ENT follow-up on 1/7  ESRD on MWF HD: Management per nephrology.  - new dry weight 84 kg  Syncope: Possibly cough syncope. Initial troponin minimally elevated which could be secondary to renal failure but subsequent troponins have been negative. No clinical pneumonia.  History of chronic A. fib: Rate controlled in the 90s. Warfarin anticoagulation discontinued secondary to spontaneous back hematoma and patient has declined anticoagulation since.  History of essential hypertension: Mildly uncontrolled. Labetalol discontinued to allow blood pressure for dialysis. - continue to monitor, doing well off antihypertensives  History of DVT/PE: Previously on Coumadin-discontinued secondary to spontaneous back hematoma and patient has declined anticoagulation since.  Leukopenia and thrombocytopenia: Secondary to liver disease. Follow CBCs. Stable.? Hypersplenism.  Palliative Medicine:  Patient with failure of 3 organ systems.   Discussed code status again on 12/31 and 1/1, remaines full code - Appreciate palliative medicine consult    Code Status: Full  Family Communication: discussed with his son, [name] [phone] on 09/08/14 and friend, Misty Stanley [phone] 1/2 (he is working to getting Misty Stanley to be his POA) Disposition Plan: Home when medically stable  Consultants:  Nephrology  Gastroenterology  Cardiology  Interventional radiology.  Procedures:  Paracentesis 1.2 L by IR on 12/23 and 1.7 L on 12/26  Hemodialysis  Transjugular liver biopsy 12/29  Antibiotics:  None   Objective: Filed Vitals:   09/10/14 0535 09/10/14 0549 09/10/14 0601 09/10/14 0652  BP: 136/114 150/113 138/110 131/96  Pulse: 120 127  91    Temp: 98.4 F (36.9 C)  TempSrc: Oral     Resp: 28     Height:      Weight:      SpO2: 96%   100%    Intake/Output Summary (Last 24 hours) at 09/10/14 1059 Last data filed at 09/10/14 0959  Gross per 24 hour  Intake    120 ml  Output   3502 ml  Net  -3382 ml   Filed Weights   09/09/14 0753 09/09/14 1130 09/10/14 0532  Weight: 85.3 kg (188 lb 0.8 oz) 84.4 kg (186 lb 1.1 oz) 84.777 kg (186 lb 14.4 oz)   Exam: General exam: Poorly nourished, moderately built male Respiratory system: bibasilar crackles, no wheezing Cardiovascular system: S1 & S2 heard, irregularly irregular; trace LE edema Gastrointestinal system: Abdomen is moderately distended(decreased), soft and nontender.  Normal bowel sounds heard. +Periumbilical hernia. Central nervous system: Alert and oriented. No focal neurological deficits. Skin: no rashes Extremities: Symmetric 5 x 5 power.  Data Reviewed: Basic Metabolic Panel:  Recent Labs Lab 09/04/14 1519 09/05/14 0713  09/06/14 1505 09/07/14 0527 09/08/14 0650 09/09/14 0403 09/10/14 0552  NA 134* 134*  < > 135 138 138 135 136  K 4.9 4.7  < > 4.2 4.0 3.9 4.2 4.3  CL 97 98  < > 98 97 100 96 99  CO2 24 24  < > 26 28 29 26 23   GLUCOSE 83 89  < > 95 78 83 83 85  BUN 49* 38*  < > 45* 32* 27* 45* 42*  CREATININE 6.94* 5.91*  < > 6.30* 5.12* 4.71* 5.90* 5.57*  CALCIUM 8.6 8.6  < > 8.6 8.6 8.8 8.6 8.7  PHOS 5.2* 4.5  --  4.2  --   --   --   --   < > = values in this interval not displayed. Liver Function Tests:  Recent Labs Lab 09/05/14 0713 09/05/14 1700 09/06/14 1505 09/07/14 0527 09/08/14 0650  AST  --  50*  --  54* 53*  ALT  --  26  --  30 30  ALKPHOS  --  220*  --  218* 227*  BILITOT  --  2.7*  --  2.6* 3.1*  PROT  --  7.0  --  7.2 8.4*  ALBUMIN 2.6* 2.6* 2.6* 2.7* 2.6*   CBC:  Recent Labs Lab 09/05/14 1700 09/06/14 1505 09/07/14 0527 09/08/14 0650 09/09/14 0403  WBC 4.7 4.6 4.3 4.3 4.5  HGB 14.6 13.2 13.8 13.8 14.7  HCT 42.2  37.8* 40.2 39.8 42.4  MCV 82.9 79.9 82.7 82.2 82.7  PLT 97* 98* 97* 107* 106*   BNP (last 3 results)  Recent Labs  08/29/14 1421  PROBNP 1830.0*    Recent Results (from the past 240 hour(s))  Body fluid culture     Status: None   Collection Time: 09/02/14  1:44 PM  Result Value Ref Range Status   Specimen Description PERITONEAL FLUID  Final   Special Requests NONE  Final   Gram Stain   Final    RARE WBC PRESENT,BOTH PMN AND MONONUCLEAR NO ORGANISMS SEEN Performed at [facility]    Culture   Final    NO GROWTH 3 DAYS Performed at [facility]    Report Status 09/05/2014 FINAL  Final    Studies: Ct Angio Chest Pe W/cm &/or Wo Cm 09/05/2014 1. Cardiomegaly with pulmonary vascular congestion and slight interstitial edema consistent with congestive heart failure. 2. No acute pulmonary emboli. One tiny linear old embolus  in a branch of the right pulmonary artery to the middle lobe. 3. Ascites.  Hepatomegaly.   Ir Venogram Hepatic W Hemodynamic Evaluation 09/05/2014 1. Patent right hepatic vein.  No evidence of Budd-Chiari. 2. Markedly elevated right heart and free hepatic venous pressures likely secondary to right heart failure. 3. Negative for evidence of portal hypertension. The portosystemic gradient ranged from 3- 8 mmHg which is within normal limits. 4. Successful transjugular random liver biopsy.   Ir Transcatheter Bx 09/05/2014 1. Patent right hepatic vein.  No evidence of Budd-Chiari. 2. Markedly elevated right heart and free hepatic venous pressures likely secondary to right heart failure. 3. Negative for evidence of portal hypertension. The portosystemic gradient ranged from 3- 8 mmHg which is within normal limits. 4. Successful transjugular random liver biopsy. Signed,  Sterling Big, MD  [facility]  [facility]   Electronically Signed   By: Malachy Moan M.D.   On: 09/05/2014 17:24   Pathology from  Liver Biopsy Histologic examination reveals cirrhosis with portal-to-portal bridging and fibrous expansion of portal tracts. There is significant biliary duct proliferation. There is diffuse severe hepatic degeneration and abundant Mallory hyaline deposition.  It is difficult to assign an etiology in this case, due to the significant findings of cirrhosis. However, the significant biliary proliferation raises the possibility of a primary biliary disorder  Ir US Guide Vasc Access Right 09/05/2014 1. Patent right hepatic vein.  No evidence of Budd-Chiari. 2. Markedly elevated right heart and free hepatic venous pressures likely secondary to right heart failure. 3. Negative for evidence of portal hypertension. The portosystemic gradient ranged from 3- 8 mmHg which is within normal limits. 4. Successful transjugular random liver biopsy.   Dg Chest Port 1 View 09/05/2014 Stable pulmonary scarring and interstitial lung disease. Stable cardiomegaly and mediastinal lymphadenopathy.  Scheduled Meds: . antiseptic oral rinse  7 mL Mouth Rinse BID  . aspirin EC  81 mg Oral Daily  . feeding supplement (NEPRO CARB STEADY)  237 mL Oral Daily  . fluticasone  2 spray Each Nare Daily  . ipratropium  2 spray Each Nare TID  . loratadine  10 mg Oral Daily  . magic mouthwash w/lidocaine  15 mL Oral QID  . multivitamin  1 tablet Oral QHS  . senna-docusate  2 tablet Oral BID  . sevelamer carbonate  3,200 mg Oral TID WC  . sodium chloride  3 mL Intravenous Q12H  . sodium chloride  3 mL Intravenous Q12H  . triamcinolone ointment  1 application Topical Daily   Continuous Infusions:   Principal Problem:   Acute on chronic combined systolic and diastolic heart failure Active Problems:   Obstructive sleep apnea   Essential hypertension   ESRD on hemodialysis   DCM- EF 20-25% by echo 09/04/14   Ascites   Chronic atrial fibrillation   Cirrhosis   RVF (right ventricular failure)   Abdominal distension    Palliative care encounter  Topaz Raglin M. Elvera Lennox, MD [facility] [phone]   If 7PM-7AM, please contact night-coverage [email or web address] Password [email or web address] 09/10/2014, 10:59 AM    LOS: 11 days

## 2014-09-11 LAB — RENAL FUNCTION PANEL
ANION GAP: 11 (ref 5–15)
Albumin: 2.6 g/dL — ABNORMAL LOW (ref 3.5–5.2)
BUN: 62 mg/dL — ABNORMAL HIGH (ref 6–23)
CHLORIDE: 101 meq/L (ref 96–112)
CO2: 24 mmol/L (ref 19–32)
Calcium: 8.7 mg/dL (ref 8.4–10.5)
Creatinine, Ser: 7.35 mg/dL — ABNORMAL HIGH (ref 0.50–1.35)
GFR calc Af Amer: 8 mL/min — ABNORMAL LOW (ref >90)
GFR calc non Af Amer: 7 mL/min — ABNORMAL LOW (ref >90)
GLUCOSE: 81 mg/dL (ref 70–99)
POTASSIUM: 4.6 mmol/L (ref 3.5–5.1)
Phosphorus: 4.6 mg/dL (ref 2.3–4.6)
Sodium: 136 mmol/L (ref 135–145)

## 2014-09-11 LAB — CBC
HCT: 40.3 % (ref 39.0–52.0)
HEMOGLOBIN: 13.8 g/dL (ref 13.0–17.0)
MCH: 27.4 pg (ref 26.0–34.0)
MCHC: 34.2 g/dL (ref 30.0–36.0)
MCV: 80.1 fL (ref 78.0–100.0)
Platelets: 122 10*3/uL — ABNORMAL LOW (ref 150–400)
RBC: 5.03 MIL/uL (ref 4.22–5.81)
RDW: 18.2 % — ABNORMAL HIGH (ref 11.5–15.5)
WBC: 4.2 10*3/uL (ref 4.0–10.5)

## 2014-09-11 MED ORDER — MAGNESIUM CITRATE PO SOLN
1.0000 | Freq: Once | ORAL | Status: AC
  Administered 2014-09-11: 1 via ORAL
  Filled 2014-09-11: qty 296

## 2014-09-11 MED ORDER — GUAIFENESIN-CODEINE 100-10 MG/5ML PO SOLN
ORAL | Status: AC
  Filled 2014-09-11: qty 5

## 2014-09-11 NOTE — Clinical Social Work Psychosocial (Signed)
Clinical Social Work Department BRIEF PSYCHOSOCIAL ASSESSMENT 09/11/2014  Patient:  Trevor [name], Trevor [name]     Account Number:  [id number]     Admit date:  08/30/2014  Clinical Social Worker:  Lovey Newcomer  Date/Time:  09/11/2014 02:30 PM  Referred by:  Physician  Date Referred:  09/11/2014 Referred for  SNF Placement   Other Referral:   NA   Interview type:  Patient Other interview type:   Patient alert and oriented at time of assessment.    PSYCHOSOCIAL DATA Living Status:  SIGNIFICANT OTHER Admitted from facility:   Level of care:   Primary support name:  Leisa Primary support relationship to patient:  NONE Degree of support available:   Support is good.    CURRENT CONCERNS Current Concerns  Post-Acute Placement   Other Concerns:   NA    SOCIAL WORK ASSESSMENT / PLAN CSW met with patient at bedside to complete assessment. Patient states that he is unsure if he will DC to SNF or home with HHPT. Patient states that he thought he would be going to CIR, but CSW explained that patient is not a candidate for this program. CSW explained SNF search/placement process to patient and answered questions. Patient reports that his main support is his girlfriend [place]. Patient appeared calm and engaged in assessment. CSW will followup with bed offers.   Assessment/plan status:  Psychosocial Support/Ongoing Assessment of Needs Other assessment/ plan:   Complete Fl2, Fax, PASRR   Information/referral to community resources:   CSW contact information and SNF list given.    PATIENT'S/FAMILY'S RESPONSE TO PLAN OF CARE: Patient states that he is not sure if he will DC to SNF or back home with HHPT. CSW will assist.         Liz Beach MSW, [place], [place], [phone]

## 2014-09-11 NOTE — Consult Note (Signed)
Reason for Consult: Spitting up scabs Referring Physician: Caren Griffins, MD  Trevor [name] is an 64 y.o. male.  HPI: 4-5 day history of spitting up small, hard, dry scabs. One year history of intractable cough for which a cause has not been identified. He doesn't smoke and drinks on occasion. He has chronic end stage renal disease and is on dialysis. He has history of coronary disease, reflux, atrial fibrillation. He is currently not on anticoagulation.  Past Medical History  Diagnosis Date  . Anemia   . AVF (arteriovenous fistula)     Left  . Secondary hyperparathyroidism   . Hypovitaminosis D   . Hypertensive urgency     H/o  . CHF (congestive heart failure)   . Exertional shortness of breath     "related to infection in my lungs right now" (05/03/2013)  . History of gout     "before I started doing the dialysis" (05/03/2013)  . Sleep apnea   . GERD (gastroesophageal reflux disease)   . Syncope     felt secondary to residual anesthesia the day before - 2D echo unremarkable  . Pulmonary embolism     with right DVT secondary to recent surgery  . Atrial fibrillation     not on coumadin due to large spontaneous hematoma on back and anemia  . Myocardial infarction 90's  . Coronary artery disease   . Dysrhythmia     afib  . Peripheral vascular disease     dvt leg 12/14  . Hypertension   . ESRD (end stage renal disease) on dialysis     [facility] mon/wed/fri  . Atrial flutter 04/27/2013  . S/P repair of ventral hernia 03/21/2014    Past Surgical History  Procedure Laterality Date  . Av fistula placement Left     Dr. Su Grand; "I've had 2 on the left' (05/03/2013)  . Av fistula placement Right ~ 2011  . Knee arthroscopy Left   . Avgg removal Right 05/04/2013    Procedure: REMOVAL OF ARTERIOVENOUS Fistula Right Arm;  Surgeon: Elam Dutch, MD;  Location: [facility];  Service: Vascular;  Laterality: Right;  . Insertion of dialysis catheter Right 05/04/2013    Procedure:  INSERTION OF DIALYSIS CATHETER;  Surgeon: Elam Dutch, MD;  Location: [place];  Service: Vascular;  Laterality: Right;  . Colonoscopy      Hx: of  . Bascilic vein transposition Left 06/27/2013    Procedure: BASCILIC VEIN TRANSPOSITION;  Surgeon: Elam Dutch, MD;  Location: [place];  Service: Vascular;  Laterality: Left;  . Cardioversion N/A 08/29/2013    Procedure: CARDIOVERSION;  Surgeon: Sueanne Margarita, MD;  Location: [place];  Service: Cardiovascular;  Laterality: N/A;  . Removal of a dialysis catheter  2/15  . Hernia repair  8/54/62    Umbilical hernia-Dr. Rosendo Gros  . Umbilical hernia repair N/A 03/21/2014    Procedure: LAPAROSCOPIC UMBILICAL HERNIA REPAIR WITH MESH;  Surgeon: Ralene Ok, MD;  Location: [facility];  Service: General;  Laterality: N/A;  . Insertion of mesh N/A 03/21/2014    Procedure: INSERTION OF MESH;  Surgeon: Ralene Ok, MD;  Location: [place];  Service: General;  Laterality: N/A;  . Venogram Left 05/26/2013    Procedure: VENOGRAM;  Surgeon: Conrad Wheeler, MD;  [facility];  Service: Cardiovascular;  Laterality: Left;    Family History  Problem Relation Age of Onset  . Hypertension Father   . Kidney disease Father   . Allergies  Father   . Deep vein thrombosis Sister   . Pulmonary embolism Sister   . Diabetes Paternal Grandmother     Social History:  reports that he quit smoking about 42 years ago. His smoking use included Cigarettes. He has a .125 pack-year smoking history. He has never used smokeless tobacco. He reports that he drinks alcohol. He reports that he does not use illicit drugs.  Allergies:  Allergies  Allergen Reactions  . Pork-Derived Products Nausea And Vomiting    Culture    Medications: Reviewed  Results for orders placed or performed during the hospital encounter of 08/30/14 (from the past 48 hour(s))  Basic metabolic panel     Status: Abnormal   Collection Time: 09/10/14  5:52 AM  Result Value Ref Range   Sodium  136 135 - 145 mmol/L    Comment: Please note change in reference range.   Potassium 4.3 3.5 - 5.1 mmol/L    Comment: Please note change in reference range.   Chloride 99 96 - 112 mEq/L   CO2 23 19 - 32 mmol/L   Glucose, Bld 85 70 - 99 mg/dL   BUN 42 (H) 6 - 23 mg/dL   Creatinine, Ser 5.57 (H) 0.50 - 1.35 mg/dL   Calcium 8.7 8.4 - 10.5 mg/dL   GFR calc non Af Amer 10 (L) >90 mL/min   GFR calc Af Amer 12 (L) >90 mL/min    Comment: (NOTE) The eGFR has been calculated using the CKD EPI equation. This calculation has not been validated in all clinical situations. eGFR's persistently <90 mL/min signify possible Chronic Kidney Disease.    Anion gap 14 5 - 15  Renal function panel     Status: Abnormal   Collection Time: 09/11/14  6:30 AM  Result Value Ref Range   Sodium 136 135 - 145 mmol/L    Comment: Please note change in reference range.   Potassium 4.6 3.5 - 5.1 mmol/L    Comment: Please note change in reference range.   Chloride 101 96 - 112 mEq/L   CO2 24 19 - 32 mmol/L   Glucose, Bld 81 70 - 99 mg/dL   BUN 62 (H) 6 - 23 mg/dL   Creatinine, Ser 7.35 (H) 0.50 - 1.35 mg/dL   Calcium 8.7 8.4 - 10.5 mg/dL   Phosphorus 4.6 2.3 - 4.6 mg/dL   Albumin 2.6 (L) 3.5 - 5.2 g/dL   GFR calc non Af Amer 7 (L) >90 mL/min   GFR calc Af Amer 8 (L) >90 mL/min    Comment: (NOTE) The eGFR has been calculated using the CKD EPI equation. This calculation has not been validated in all clinical situations. eGFR's persistently <90 mL/min signify possible Chronic Kidney Disease.    Anion gap 11 5 - 15  CBC     Status: Abnormal   Collection Time: 09/11/14  8:23 AM  Result Value Ref Range   WBC 4.2 4.0 - 10.5 K/uL   RBC 5.03 4.22 - 5.81 MIL/uL   Hemoglobin 13.8 13.0 - 17.0 g/dL   HCT 40.3 39.0 - 52.0 %   MCV 80.1 78.0 - 100.0 fL   MCH 27.4 26.0 - 34.0 pg   MCHC 34.2 30.0 - 36.0 g/dL   RDW 18.2 (H) 11.5 - 15.5 %   Platelets 122 (L) 150 - 400 K/uL    No results found.  VOZ:[id number] except  as listed in admit H&P  Blood pressure 127/91, pulse 106, temperature 97.9 F (36.6 C), temperature  source Oral, resp. rate 24, height _0  (1.854 m), weight 82.5 kg (181 lb 14.1 oz), SpO2 100 %.  PHYSICAL EXAM: Overall appearance:  Healthy appearing, in no distress. He has a significant intermittent cough that is productive of mucoid secretions. Head:  Normocephalic, atraumatic. Ears: External ears appear normal. Nose: External nose is healthy in appearance. Internal nasal exam free of any lesions or obstruction. Oral Cavity:  There are no mucosal lesions or masses identified. Oral Pharynx/Hypopharynx/Larynx: no signs of any mucosal lesions or masses identified.  Larynx/Hypopharynx: Negative by indirect examination. No signs of any mucosal lesions or masses. Orders move well. Neuro:  No identifiable neurologic deficits. Neck: No palpable neck masses.  Studies Reviewed: none  Procedures: none   Assessment/Plan: Chronic cough and spitting up small scabs. No evidence of tonsil disease. No evidence of larynx or pharynx abnormality. I suspect this is related to his chronic cough. Contact me again if additional concerns arise.  Ashea Winiarski 09/11/2014, 5:02 PM

## 2014-09-11 NOTE — Procedures (Signed)
Pt seen on HD.  Ap 210 Vp 210.  BFR 400.  Says he was seen by rehab this AM.  He is agreeable to rehab if accepted.

## 2014-09-11 NOTE — Progress Notes (Signed)
PROGRESS NOTE  Trevor [name] JXB:[id number] DOB: [birth year] DOA: 08/30/2014 PCP: [name], MD  HPI/Brief narrative 64 year old male patient with history of ESRD on HD MWF, chronic systolic CHF, A. 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  He had dyspnea secondary to abdominal distention. He had vigorous coughing spells and passed out. He saw his cardiologist and was advised to come to the hospital. He underwent diagnostic and therapeutic paracentesis by IR, twice. Nephrology consulting as he is receiving daily HD during this hospitalization. GI consulted and recommended transjugular liver biopsy which was completed on 12/29. Cardiology consulting for biventricular heart failure.  Subjective: Patient tired after HD. Chronic coughing non stop, bringing up multiple small dark stones.  Assessment/Plan:  Chronic cough with odynaphagia:   Patient indicates his pain is in his throat. He has been coughing for several months. Bringing up clear sputum with small stones of dried blood. (1/4). On 12/31, I spoke with Dr. Jenne Pane and arranged outpatient ENT follow-up on 1/7, however as the patient is in such pain and coughing up dried blood will call ENT once again to request an inpatient evaluation.  Massive ascites with new diagnosis of Cirrhosis:   Ascites improved with HD.  Multifactorial, possibly from Cirrhosis as well as congestive hepatopathy from RHF.  Status post 1.2 L and 1.7 L paracentesis by IR.  No SBP.  Received daily hemodialysis to control the fluid, last hemodialysis 1/2, now he will resume his prior Monday Wednesday and Friday schedule.  GI suggests cirrhosis with portal hypertension accounting for ascites and splenomegaly. S/p transjugular liver biopsy 12/29-results indicate cirrhosis - possibly of primary biliary source.     Acute on chronic biventricular failure, predominantly right heart failure: Volume overloaded-management across dialysis. 2-D echo 12/28: Moderate LVH, LVEF 25-30 percent, diffuse hypokinesis. Akinesis of anteroseptal myocardium. PA peak pressure 66 mmHg. Cardiology input appreciated: Continued hemodialysis to avoid acute left heart failure,  CTA chest negative for recurrent PE.   ESRD on MWF HD: Management per nephrology. New dry weight 84 kg  Syncope: Possibly cough syncope. Initial troponin minimally elevated which could be secondary to renal failure but subsequent troponins have been negative. No clinical pneumonia.  History of chronic A. fib: Rate controlled in the 90s. Warfarin anticoagulation discontinued secondary to spontaneous back hematoma and patient has declined anticoagulation since.  History of essential hypertension: Mildly uncontrolled. Labetalol discontinued to allow blood pressure for dialysis. Continue to monitor, doing well off antihypertensives  History of DVT/PE: Previously on Coumadin-discontinued secondary to spontaneous back hematoma and patient has declined anticoagulation since.  Leukopenia and thrombocytopenia: Secondary to liver disease. Follow CBCs. Stable.? Hypersplenism.  Palliative Medicine: Patient with failure of 3 organ systems. Discussed code status again on 12/31 and 1/1, remaines full code. Appreciate palliative medicine consult.    Code Status: Full  Family Communication: discussed with his son, [name] [phone] on 09/08/14 and friend, Misty Stanley [phone] 1/2 (he is working to getting Misty Stanley to be his POA) Disposition Plan: SNF vs Home when medically stable.  Hopefully in next 24 - 48 hours.  Consultants:  Nephrology  Gastroenterology  Cardiology  Interventional radiology.  Palliative Medicine  Procedures:  Paracentesis 1.2 L by IR on 12/23 and 1.7 L on 12/26  Hemodialysis  Transjugular liver biopsy 12/29  Antibiotics:  None    Objective: Filed Vitals:   09/11/14 1200 09/11/14 1222 09/11/14  1248 09/11/14 1337  BP: 97/63 97/66 104/62 127/91  Pulse: 112 112 111 106  Temp:    97.9 F (36.6 C)  TempSrc:    Oral  Resp:  21  24  Height:      Weight:  82.5 kg (181 lb 14.1 oz)    SpO2:  100%  100%    Intake/Output Summary (Last 24 hours) at 09/11/14 1533 Last data filed at 09/11/14 1222  Gross per 24 hour  Intake      0 ml  Output   4000 ml  Net  -4000 ml   Filed Weights   09/11/14 0554 09/11/14 0811 09/11/14 1222  Weight: 84 kg (185 lb 3 oz) 86.4 kg (190 lb 7.6 oz) 82.5 kg (181 lb 14.1 oz)   Exam: General exam: Poorly nourished, moderately built male.  Sitting on the side of the bed.  tachypneic. Respiratory system: bibasilar crackles, no wheezing Cardiovascular system: S1 & S2 heard, irregularly irregular, tachycardic; mild lower extremity edema. Gastrointestinal system: Abdomen is moderately distended, firm and nontender.  Normal bowel sounds heard. +Periumbilical hernia. Central nervous system: Alert and oriented. No focal neurological deficits. Skin: no rashes  Data Reviewed: Basic Metabolic Panel:  Recent Labs Lab 09/05/14 0713  09/06/14 1505 09/07/14 0527 09/08/14 0650 09/09/14 0403 09/10/14 0552 09/11/14 0630  NA 134*  < > 135 138 138 135 136 136  K 4.7  < > 4.2 4.0 3.9 4.2 4.3 4.6  CL 98  < > 98 97 100 96 99 101  CO2 24  < > 26 28 29 26 23 24   GLUCOSE 89  < > 95 78 83 83 85 81  BUN 38*  < > 45* 32* 27* 45* 42* 62*  CREATININE 5.91*  < > 6.30* 5.12* 4.71* 5.90* 5.57* 7.35*  CALCIUM 8.6  < > 8.6 8.6 8.8 8.6 8.7 8.7  PHOS 4.5  --  4.2  --   --   --   --  4.6  < > = values in this interval not displayed. Liver Function Tests:  Recent Labs Lab 09/05/14 1700 09/06/14 1505 09/07/14 0527 09/08/14 0650 09/11/14 0630  AST 50*  --  54* 53*  --   ALT 26  --  30 30  --   ALKPHOS 220*  --  218* 227*  --   BILITOT 2.7*  --  2.6* 3.1*  --   PROT 7.0  --  7.2 8.4*  --   ALBUMIN 2.6*  2.6* 2.7* 2.6* 2.6*   CBC:  Recent Labs Lab 09/06/14 1505 09/07/14 0527 09/08/14 0650 09/09/14 0403 09/11/14 0823  WBC 4.6 4.3 4.3 4.5 4.2  HGB 13.2 13.8 13.8 14.7 13.8  HCT 37.8* 40.2 39.8 42.4 40.3  MCV 79.9 82.7 82.2 82.7 80.1  PLT 98* 97* 107* 106* 122*   BNP (last 3 results)  Recent Labs  08/29/14 1421  PROBNP 1830.0*    Recent Results (from the past 240 hour(s))  Body fluid culture     Status: None   Collection Time: 09/02/14  1:44 PM  Result Value Ref Range Status   Specimen Description PERITONEAL FLUID  Final   Special Requests NONE  Final   Gram Stain   Final    RARE WBC PRESENT,BOTH PMN AND MONONUCLEAR NO ORGANISMS SEEN Performed at [facility]    Culture   Final    NO GROWTH 3 DAYS Performed at [facility]    Report Status 09/05/2014 FINAL  Final  Studies: Ct Angio Chest Pe W/cm &/or Wo Cm 09/05/2014 1. Cardiomegaly with pulmonary vascular congestion and slight interstitial edema consistent with congestive heart failure. 2. No acute pulmonary emboli. One tiny linear old embolus in a branch of the right pulmonary artery to the middle lobe. 3. Ascites.  Hepatomegaly.   Ir Venogram Hepatic W Hemodynamic Evaluation 09/05/2014 1. Patent right hepatic vein.  No evidence of Budd-Chiari. 2. Markedly elevated right heart and free hepatic venous pressures likely secondary to right heart failure. 3. Negative for evidence of portal hypertension. The portosystemic gradient ranged from 3- 8 mmHg which is within normal limits. 4. Successful transjugular random liver biopsy.   Ir Transcatheter Bx 09/05/2014 1. Patent right hepatic vein.  No evidence of Budd-Chiari. 2. Markedly elevated right heart and free hepatic venous pressures likely secondary to right heart failure. 3. Negative for evidence of portal hypertension. The portosystemic gradient ranged from 3- 8 mmHg which is within normal limits. 4. Successful transjugular random liver biopsy. Signed,   Sterling Big, MD  [facility]  [facility]   Electronically Signed   By: Malachy Moan M.D.   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 There is significant biliary duct proliferation. There is diffuse severe hepatic degeneration and abundant Mallory hyaline deposition.  It is difficult to assign an etiology in this case, due to the significant findings of cirrhosis. However, the significant biliary proliferation raises the possibility of a primary biliary disorder  Ir US Guide Vasc Access Right 09/05/2014 1. Patent right hepatic vein.  No evidence of Budd-Chiari. 2. Markedly elevated right heart and free hepatic venous pressures likely secondary to right heart failure. 3. Negative for evidence of portal hypertension. The portosystemic gradient ranged from 3- 8 mmHg which is within normal limits. 4. Successful transjugular random liver biopsy.   Dg Chest Port 1 View 09/05/2014 Stable pulmonary scarring and interstitial lung disease. Stable cardiomegaly and mediastinal lymphadenopathy.  Scheduled Meds: . antiseptic oral rinse  7 mL Mouth Rinse BID  . aspirin EC  81 mg Oral Daily  . feeding supplement (NEPRO CARB STEADY)  237 mL Oral Daily  . fluticasone  2 spray Each Nare Daily  . guaiFENesin-codeine      . ipratropium  2 spray Each Nare TID  . labetalol  100 mg Oral BID  . loratadine  10 mg Oral Daily  . magic mouthwash w/lidocaine  15 mL Oral QID  . magnesium citrate  1 Bottle Oral Once  . multivitamin  1 tablet Oral QHS  . senna-docusate  2 tablet Oral BID  . sevelamer carbonate  3,200 mg Oral TID WC  . sodium chloride  3 mL Intravenous Q12H  . sodium chloride  3 mL Intravenous Q12H  . 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  I have directly reviewed the clinical findings, lab, imaging studies and management of this patient in detail. I have made the necessary changes to the above noted documentation, and agree with the documentation, as recorded by the Advance Practice Provider.    Pamella Pert, MD [facility] [phone]

## 2014-09-11 NOTE — Progress Notes (Signed)
Physical medicine and rehabilitation consult requested with chart reviewed. Patient is ambulating in his room. He does not meet criteria at this time for inpatient rehabilitation services if he is not able to return home best served by skilled nursing facility.

## 2014-09-11 NOTE — Clinical Social Work Placement (Signed)
Clinical Social Work Department CLINICAL SOCIAL WORK PLACEMENT NOTE 09/11/2014  Patient:  KOLTER, BEACHUM  Account Number:  [id number] Admit date:  08/30/2014  Clinical Social Worker:  Lavell Luster  Date/time:  09/11/2014 09:56 PM  Clinical Social Work is seeking post-discharge placement for this patient at the following level of care:   SKILLED NURSING   (*CSW will update this form in Epic as items are completed)   09/11/2014  Patient/family provided with [facility] Department of Clinical Social Work's list of facilities offering this level of care within the geographic area requested by the patient (or if unable, by the patient's family).  09/11/2014  Patient/family informed of their freedom to choose among providers that offer the needed level of care, that participate in Medicare, Medicaid or managed care program needed by the patient, have an available bed and are willing to accept the patient.  09/11/2014  Patient/family informed of [facility]' ownership interest in [facility], as well as of the fact that they are under no obligation to receive care at this facility.  PASARR submitted to EDS on 09/11/2014 PASARR number received on 09/11/2014  FL2 transmitted to all facilities in geographic area requested by pt/family on  09/11/2014 FL2 transmitted to all facilities within larger geographic area on   Patient informed that his/her managed care company has contracts with or will negotiate with  certain facilities, including the following:     Patient/family informed of bed offers received:  09/11/2014 Patient chooses bed at  Physician recommends and patient chooses bed at    Patient to be transferred to  on   Patient to be transferred to facility by  Patient and family notified of transfer on  Name of family member notified:    The following physician request were entered in Epic:   Additional Comments:   Roddie Mc MSW, [place], [place],  [phone]

## 2014-09-12 MED ORDER — FLUTICASONE PROPIONATE 50 MCG/ACT NA SUSP: 2.0000 | Freq: Every day | NASAL | Status: DC

## 2014-09-12 MED ORDER — MAGIC MOUTHWASH W/LIDOCAINE: 10.0000 mL | Freq: Four times a day (QID) | ORAL | Status: DC

## 2014-09-12 MED ORDER — LORATADINE 10 MG PO TABS: 10.0000 mg | ORAL_TABLET | Freq: Every day | ORAL | Status: DC

## 2014-09-12 MED ORDER — IPRATROPIUM BROMIDE 0.06 % NA SOLN: 2.0000 | Freq: Three times a day (TID) | NASAL | Status: DC

## 2014-09-12 MED ORDER — SEVELAMER CARBONATE 800 MG PO TABS: 3200.0000 mg | ORAL_TABLET | Freq: Three times a day (TID) | ORAL | Status: DC

## 2014-09-12 MED ORDER — HYDROXYZINE HCL 25 MG PO TABS: 25.0000 mg | ORAL_TABLET | Freq: Four times a day (QID) | ORAL | Status: DC | PRN

## 2014-09-12 MED ORDER — SENNOSIDES-DOCUSATE SODIUM 8.6-50 MG PO TABS
2.0000 | ORAL_TABLET | Freq: Two times a day (BID) | ORAL | Status: DC
Start: 2014-09-12 — End: 2014-10-05

## 2014-09-12 MED ORDER — LABETALOL HCL 200 MG PO TABS
200.0000 mg | ORAL_TABLET | Freq: Two times a day (BID) | ORAL | Status: DC
  Administered 2014-09-12: 200 mg via ORAL
  Filled 2014-09-12 (×2): qty 1

## 2014-09-12 NOTE — Progress Notes (Signed)
Pt. Received discharge instructions and prescriptions. Educated pt. On follow-up appointments. Family at bedside. Removed IV. Pt. Had multiple areas on back noted (prior to admission) that he had been scratching. All questions answered. No further needs noted at this time.

## 2014-09-12 NOTE — Progress Notes (Signed)
SATURATION QUALIFICATIONS: (This note is used to comply with regulatory documentation for home oxygen)  Patient Saturations on Room Air at Rest = 91%%  Patient Saturations on Room Air while Ambulating = 95%  Patient Saturations on  Liters of oxygen while Ambulating = %   N/A  Please briefly explain why patient needs home oxygen:  Today, pt maintained low, but adequate saturations on RA.  Only after doing exercise standing at the sink did sats drop to 89% and EHR 104, with quick recovery.  No supplemental oxygen needs based on today's numbers.  09/12/2014  Duryea Bing, PT [phone]  (pager)

## 2014-09-12 NOTE — Progress Notes (Signed)
S: No new CO, appetite good, no sob O:BP 100/75 mmHg  Pulse 99  Temp(Src) 98.4 F (36.9 C) (Oral)  Resp 28  Ht 6\' 1"  (1.854 m)  Wt 83.9 kg (184 lb 15.5 oz)  BMI 24.41 kg/m2  SpO2 99%  Intake/Output Summary (Last 24 hours) at 09/12/14 0949 Last data filed at 09/11/14 1250  Gross per 24 hour  Intake    240 ml  Output   4000 ml  Net  -3760 ml   Weight change: 2.4 kg (5 lb 4.7 oz) TDV:VOHYW and alert VPX:TGGYI, irreg Resp:Bil crackles Abd:+ BS NTND Ext: trace edema on Rt LE.  Lt AVF + bruit NEURO:CNI Ox3 No asterixis   . antiseptic oral rinse  7 mL Mouth Rinse BID  . aspirin EC  81 mg Oral Daily  . feeding supplement (NEPRO CARB STEADY)  237 mL Oral Daily  . fluticasone  2 spray Each Nare Daily  . ipratropium  2 spray Each Nare TID  . labetalol  100 mg Oral BID  . loratadine  10 mg Oral Daily  . magic mouthwash w/lidocaine  15 mL Oral QID  . multivitamin  1 tablet Oral QHS  . senna-docusate  2 tablet Oral BID  . sevelamer carbonate  3,200 mg Oral TID WC  . sodium chloride  3 mL Intravenous Q12H  . sodium chloride  3 mL Intravenous Q12H  . triamcinolone ointment  1 application Topical Daily   No results found. BMET    Component Value Date/Time   NA 136 09/11/2014 0630   K 4.6 09/11/2014 0630   CL 101 09/11/2014 0630   CO2 24 09/11/2014 0630   GLUCOSE 81 09/11/2014 0630   BUN 62* 09/11/2014 0630   CREATININE 7.35* 09/11/2014 0630   CALCIUM 8.7 09/11/2014 0630   CALCIUM 10.3 04/05/2010 1337   GFRNONAA 7* 09/11/2014 0630   GFRAA 8* 09/11/2014 0630   CBC    Component Value Date/Time   WBC 4.2 09/11/2014 0823   WBC 2.7* 10/25/2009 0933   RBC 5.03 09/11/2014 0823   RBC 3.69* 04/05/2010 1519   RBC 3.89* 10/25/2009 0933   HGB 13.8 09/11/2014 0823   HGB 9.9* 10/25/2009 0933   HCT 40.3 09/11/2014 0823   HCT 30.4* 10/25/2009 0933   PLT 122* 09/11/2014 0823   PLT 148 10/25/2009 0933   MCV 80.1 09/11/2014 0823   MCV 78.1* 10/25/2009 0933   MCH 27.4 09/11/2014  0823   MCH 25.4* 10/25/2009 0933   MCHC 34.2 09/11/2014 0823   MCHC 32.6 10/25/2009 0933   RDW 18.2* 09/11/2014 0823   RDW 14.7* 10/25/2009 0933   LYMPHSABS 0.5* 08/30/2014 0402   LYMPHSABS 0.7* 10/25/2009 0933   MONOABS 0.3 08/30/2014 0402   MONOABS 0.5 10/25/2009 0933   EOSABS 0.1 08/30/2014 0402   EOSABS 0.2 10/25/2009 0933   BASOSABS 0.0 08/30/2014 0402   BASOSABS 0.0 10/25/2009 0933     Assessment: 1. Cirrhosis 2. Pulm edema, improved 3. HTN 4. A fib.  Not on coumadin due to bleeding 5. ESRD  Plan: 1. HD in AM 2. Ambulate and check O2 sats to see if needs home O2   Trevor [name] T

## 2014-09-12 NOTE — Discharge Summary (Signed)
Physician Discharge Summary  ZARON VOELTZ TGY:[id number] DOB: Jan 20, 1951 DOA: 08/30/2014  PCP: [name], MD  Admit date: 08/30/2014 Discharge date: 09/12/2014  Time spent: 60 minutes  Recommendations for Outpatient Follow-up:  1. Follow up with Nephrology for regularly scheduled HD 2. Follow up with Cardiology for biventricular heart failure 3. Follow up with Gastroenterology - ([facility]) for liver disease  4. 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  Diet recommendation: Renal diet.  History of present illness:  64 year old male patient with history of ESRD on HD MWF, biventricular heart failure, A. 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 He had dyspnea secondary to abdominal distention. He had vigorous coughing spells and passed out. He saw his cardiologist and was advised to come to the hospital. He underwent diagnostic and therapeutic paracentesis by IR, twice. Nephrology consulting as he is receiving daily HD during this hospitalization. GI consulted and recommended transjugular liver biopsy which was completed on 12/29. Cardiology consulting for biventricular heart failure.  Hospital Course:   Massive ascites with new diagnosis of Cirrhosis:  Multifactorial, possibly from Cirrhosis as well as congestive hepatopathy from RHF. Status post 1.2 L and 1.7 L paracentesis by  IR. No SBP. Received daily hemodialysis to control the fluid, last hemodialysis, now he will resume his prior Monday Wednesday and Friday schedule. GI suggests cirrhosis with portal hypertension accounting for ascites and splenomegaly. S/p transjugular liver biopsy 12/29-results indicate cirrhosis - possibly of primary biliary source. With the combination of paracentesis and HD he has lost 37 lbs this admission and has established a new dry weight ~84 kg. Abdominal distention is much improved.   Acute on chronic biventricular failure, predominantly right heart failure: Volume overloaded-management across dialysis. 2-D echo 12/28: Moderate LVH, LVEF 25-30 percent, diffuse hypokinesis. Akinesis of anteroseptal myocardium. PA peak pressure 66 mmHg. Cardiology input appreciated: Continued hemodialysis to avoid acute left heart failure, CTA chest negative for recurrent PE. Patient will be discharged home on labetalol 20 mg daily. We have requested outpatient cardiology follow up.    ESRD on MWF HD: Management per nephrology. New dry weight 84 kg  Syncope: Possibly cough syncope. Initial troponin minimally elevated which could be secondary to renal failure but subsequent troponins have been negative. No clinical pneumonia.  History of chronic A. fib: Rate controlled in the 90s on labetalol. Warfarin anticoagulation discontinued secondary to spontaneous back hematoma and patient has declined anticoagulation since.    History of essential hypertension: Continue labetalol.  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  Leukopenia and thrombocytopenia: Secondary to liver disease. Follow CBCs. Stable.? Hypersplenism.  Chronic cough with odynaphagia:  He has been coughing for several months. Bringing up clear sputum with small scabs of dried blood. ENT evaluated the patient on 1/4 and found no evidence of tonsil  disease. No evidence of larynx or pharynx abnormality. Will prescribe magic mouthwash and  chloraseptic for comfort.  Palliative Medicine: Patient with failure of 3 organ systems. Discussed code status again on 12/3, 1/1, and 1/2 remains full code.  Appreciate palliative medicine consult. Patient is at very high risk for readmission. Lives at home independently with a friend.   Procedures:  Paracentesis x 2  Hemo Dialysis  Transjugular Liver Bx.  Consultations:  GI  IR  Nephrology  Cardiology  Palliative.  Discharge Exam: Filed Vitals:   09/11/14 2218 09/12/14 0512 09/12/14 0624 09/12/14 1054  BP: 114/86 100/75  134/83  Pulse: 109 99    Temp: 98.7 F (37.1 C) 98.4 F (36.9 C)    TempSrc: Oral Oral    Resp: 30 28    Height:      Weight:   83.9 kg (184 lb 15.5 oz)   SpO2: 94% 99%     Filed Weights   09/11/14 0811 09/11/14 1222 09/12/14 0624  Weight: 86.4 kg (190 lb 7.6 oz) 82.5 kg (181 lb 14.1 oz) 83.9 kg (184 lb 15.5 oz)   General exam: Poorly nourished, moderately built male. Sitting on the side of the bed. slightly tachypneic. Respiratory system: decreased coarse breath sounds, short breaths, mildly increased work of breathing. Cardiovascular system: S1 & S2 heard, irregularly irregular, tachycardic; no further lower extremity edema. Gastrointestinal system: Abdomen is moderately distended, firm and nontender. Normal bowel sounds heard. +Periumbilical hernia. Central nervous system: Alert and oriented. No focal neurological deficits. Skin: no rashes  Discharge Instructions  Discharge Instructions    Diet general    Complete by:  As directed   Renal Diet - soft foods     Increase activity slowly    Complete by:  As directed           Current Discharge Medication List    START taking these medications   Details  Alum & Mag Hydroxide-Simeth (MAGIC MOUTHWASH W/LIDOCAINE) SOLN Take 10 mLs by mouth 4 (four) times daily. Qty: 300 mL, Refills: 3     fluticasone (FLONASE) 50 MCG/ACT nasal spray Place 2 sprays into both nostrils daily. Qty: 17 g, Refills: 2    hydrOXYzine (ATARAX/VISTARIL) 25 MG tablet Take 1 tablet (25 mg total) by mouth every 6 (six) hours as needed for itching. Qty: 30 tablet, Refills: 0    ipratropium (ATROVENT) 0.06 % nasal spray Place 2 sprays into both nostrils 3 (three) times daily. Qty: 15 mL, Refills: 12    loratadine (CLARITIN) 10 MG tablet Take 1 tablet (10 mg total) by mouth daily.    senna-docusate (SENOKOT-S) 8.6-50 MG per tablet Take 2 tablets by mouth 2 (two) times daily.      CONTINUE these medications which have CHANGED   Details  sevelamer carbonate (RENVELA) 800 MG tablet Take 4 tablets (3,200 mg total) by mouth 3 (three) times daily with meals. Qty: 360 tablet, Refills: 3      CONTINUE these medications which have NOT CHANGED   Details  aspirin EC 81 MG tablet Take 1 tablet (81 mg total) by mouth daily. Qty: 30 tablet, Refills: 0    labetalol (NORMODYNE) 200 MG tablet Take 200 mg by mouth 2 (two) times daily.    multivitamin (RENA-VIT) TABS tablet Take 1 tablet by mouth daily. Refills: 0    Nutritional Supplements (NEPRO) LIQD Take by mouth daily.    vitamin C (ASCORBIC ACID) 500 MG tablet Take 500-1,500 mg by mouth as needed.     triamcinolone ointment (KENALOG) 0.5 % Apply 0.5 application topically daily. Refills: 0  STOP taking these medications     amLODipine (NORVASC) 10 MG tablet        Allergies  Allergen Reactions  . Pork-Derived Products Nausea And Vomiting    Culture   Follow-up Information    Follow up with [name], MD In 2 weeks.   Specialty:  Internal Medicine   Contact information:   [address] [place] [address] [phone]       Please follow up.   Why:  Continue HD as previously scheduled.      Follow up with Lesleigh Noe, MD In 2 weeks.   Specialty:  Cardiology   Contact information:   [address] [place] [address] [phone]       Follow up with Melvia Heaps, MD In 3 weeks.   Specialty:  Gastroenterology   Contact information:   [address] [place] [address] [phone]      The results of significant diagnostics from this hospitalization (including imaging, microbiology, ancillary and laboratory) are listed below for reference.    Significant Diagnostic Studies: Ct Abdomen Pelvis Wo Contrast  08/30/2014   CLINICAL DATA:  Worsening abdominal distention and generalized abdominal pain, chronic in nature. Initial encounter.  EXAM: CT ABDOMEN AND PELVIS WITHOUT CONTRAST  TECHNIQUE: Multidetector CT imaging of the abdomen and pelvis was performed following the standard protocol without IV contrast.  COMPARISON:  CT of the abdomen and pelvis from 03/26/2014  FINDINGS: Mild patchy bilateral atelectasis is noted. Diffuse coronary artery calcifications are seen. Scattered calcified enlarged hilar and mediastinal nodes are again seen, measuring up to 3.0 cm in short axis. Cardiomegaly is noted.  There is significantly increased moderate to large volume ascites within the abdomen and pelvis.  Hepatomegaly is again noted. A 1.9 cm focus of decreased attenuation within the right hepatic lobe, with overlying retraction of the hepatic capsule, appears to have increased in size from the prior study. Malignancy cannot be excluded. Evaluation for additional hepatic masses is limited without contrast. The spleen is enlarged, as on the prior study, measuring 13.8 cm in length.  The pancreas and adrenal glands are unremarkable in appearance.  Severe bilateral renal atrophy is noted, with a few scattered renal cysts. Nonspecific perinephric stranding is noted bilaterally. There is no evidence of hydronephrosis. No renal or ureteral stones are seen.  The small bowel is unremarkable in appearance. The stomach is within normal limits. No acute vascular abnormalities are seen.  Scattered calcification is seen along the abdominal aorta and its branches. The vasculature is difficult to fully assess without contrast.  The appendix is not definitely characterized given surrounding ascites. The colon is partially filled with stool and is grossly unremarkable in appearance, though difficult to fully characterize due to surrounding ascites.  The bladder is mildly distended and unremarkable in appearance. The prostate remains normal in size, with scattered calcification. Mildly enlarged bilateral inguinal nodes are seen, measuring up to 1.5 cm; some of this may reflect underlying soft tissue edema.  Diffuse soft tissue edema is again noted, raising concern for anasarca. This is mildly improved from the prior study.  No acute osseous abnormalities are identified. Diffuse uniform sclerotic change within the visualized osseous structures reflects renal osteodystrophy.  IMPRESSION: 1. Significantly increased moderate to large volume abdominopelvic ascites. 2. 1.9 cm focus of decreased attenuation within the right hepatic lobe, with overlying retraction of the hepatic capsule, appears to have increased in size from the prior study. Malignancy cannot  be excluded. Would correlate with LFTs and consider dynamic liver protocol CT for further evaluation. 3. Splenomegaly and hepatomegaly noted. 4. Cardiomegaly noted.  Diffuse coronary artery calcifications seen. 5. Scattered calcified enlarged hilar and mediastinal nodes again noted, measuring up to 3.0 cm in short axis. These were present in 2014, and thought to reflect remote granulomatous disease. 6. Mild patchy bilateral atelectasis noted. 7. Severe bilateral renal atrophy, with a few scattered renal cysts. 8. Scattered calcification along the abdominal aorta and its branches. 9. Mildly enlarged bilateral inguinal nodes, measuring up to 1.5 cm in short axis. This may partially reflect underlying soft tissue edema. 10. Diffuse soft tissue edema, raising  concern for anasarca; this is mildly improved from the prior study. 11. Findings of renal osteodystrophy.   Electronically Signed   By: Roanna Raider M.D.   On: 08/30/2014 07:44   Dg Chest 2 View  08/30/2014   CLINICAL DATA:  Acute onset of shortness of breath and abdominal distention. Initial encounter.  EXAM: CHEST  2 VIEW  COMPARISON:  Chest radiograph performed 01/16/2014, and CT of the chest performed 09/01/2013  FINDINGS: The lungs are hypoexpanded. Bibasilar airspace opacities may reflect multifocal pneumonia or pulmonary edema. There is no evidence of pleural effusion or pneumothorax.  The heart is enlarged.  No acute osseous abnormalities are seen.  IMPRESSION: Lungs hypoexpanded. Bibasilar airspace opacities may reflect multifocal pneumonia or mild pulmonary edema. Cardiomegaly noted.   Electronically Signed   By: Roanna Raider M.D.   On: 08/30/2014 05:41   Ct Angio Chest Pe W/cm &/or Wo Cm  09/05/2014   CLINICAL DATA:  Shortness of breath.  EXAM: CT ANGIOGRAPHY CHEST WITH CONTRAST  TECHNIQUE: Multidetector CT imaging of the chest was performed using the standard protocol during bolus administration of intravenous contrast. Multiplanar CT image reconstructions and MIPs were obtained to evaluate the vascular anatomy.  CONTRAST:  OMNIPAQUE IOHEXOL 350 MG/ML SOLN  COMPARISON:  Chest x-ray dated 09/05/2014 and CT angiogram dated 06/28/2013  FINDINGS: There is a tiny linear filling defect in a single or branch of the pulmonary artery to the right middle lobe seen on image 54 of series 4. I suspect this is the remnant of an old pulmonary embolism. There are no acute pulmonary emboli.  There is chronic cardiomegaly. There is extensive hilar or mediastinal adenopathy with calcifications consistent with previous granulomatous disease or sarcoidosis.  There is peribronchial thickening with accentuation of the interstitial markings, most likely due to slight pulmonary edema. No effusions.  The patient  has ascites with hepatomegaly. The bones are diffusely sclerotic due to secondary hyperparathyroidism.  Review of the MIP images confirms the above findings.  IMPRESSION: 1. Cardiomegaly with pulmonary vascular congestion and slight interstitial edema consistent with congestive heart failure. 2. No acute pulmonary emboli. One tiny linear old embolus in a branch of the right pulmonary artery to the middle lobe. 3. Ascites.  Hepatomegaly.   Electronically Signed   By: Geanie Cooley M.D.   On: 09/05/2014 20:50   US Abdomen Complete  08/30/2014   CLINICAL DATA:  Abnormal liver function tests.  Ascites.  EXAM: ULTRASOUND ABDOMEN COMPLETE  COMPARISON:  None.  FINDINGS: Gallbladder: Cholelithiasis. Gallbladder wall thickening measuring 9.1 mm. Negative sonographic Murphy's sign. Hyperechoic foci along the gallbladder wall with ring down artifact as can be seen with adenomyomatosis.  Common bile duct: Diameter: 5.5 mm  Liver: No focal lesion identified. Increased hepatic parenchymal echogenicity. There is hepatomegaly.  IVC: No abnormality visualized.  Pancreas:  Visualized portion unremarkable.  Spleen: The spleen is enlarged measuring 720 mL. No focal splenic lesion.  Right Kidney: Length: 7.1 cm. Echogenicity within normal limits. No mass or hydronephrosis visualized.  Left Kidney: Length: 5.8 cm. There is an anechoic left renal mass with increased through transmission measuring 13 x 17 mm most consistent with a cyst. Echogenicity within normal limits. No hydronephrosis visualized.  Abdominal aorta: The proximal abdominal aorta measures 3 cm in AP diameter.  Other findings: There is moderate abdominal ascites.  IMPRESSION: 1. Cholelithiasis. Gallbladder wall thickening which may be secondary to intrinsic gallbladder wall abnormality suggest cholecystitis, but this appearance can be seen in the setting of hepatocellular disease and ascites. 2. Atrophic kidneys. 3. Splenomegaly. 4. The hypodense mass seen in the right  hepatic lobe adjacent to the liver capsule on CT abdomen performed same day is not visualized on the ultrasound. Further evaluation with a CT or MRI of the abdomen is recommended optimized for evaluation of the liver. 5. Proximal abdominal aorta measures 3 cm in AP diameter. Recommend followup by [place] in 3 years. This recommendation follows ACR consensus guidelines: White Paper of the ACR Incidental Findings Committee II on Vascular Findings. Earlyne Iba Radiol 2013; 16:109-604   Electronically Signed   By: Elige Ko   On: 08/30/2014 17:55   Ct Abdomen Pelvis W Contrast  08/30/2014   CLINICAL DATA:  Abdominal tenderness in distension.  Liver lesion  EXAM: CT ABDOMEN AND PELVIS WITH CONTRAST  TECHNIQUE: Multidetector CT imaging of the abdomen and pelvis was performed using the standard protocol following bolus administration of intravenous contrast.  CONTRAST:  80mL OMNIPAQUE IOHEXOL 300 MG/ML  SOLN  COMPARISON:  CT 08/30/2014 at 6:30 a.m.  FINDINGS: Lower chest: Interlobular septal thickening at the lung bases. No pleural fluid. Heart is enlarged. Pericardial fluid.  Hepatobiliary: There is reflux of contrast into the hepatic veins suggesting right heart dysfunction. Moderate volume of fluid surrounding the margin of the liver similar to prior. Slight decrease in the fluid in the pelvis. There is mild traction of the right hepatic lobe not changed from prior with a small hypodense lesions. Gallbladder is collapsed.  Pancreas: Difficult to evaluate the intra-abdominal organs due to ascites. No gross abnormality pancreas.  Spleen: Spleen is enlarged.  Adrenals/urinary tract: Adrenal is normal. Kidneys are atrophic. Bladder is collapsed. No your ureter abnormality  Stomach/Bowel: Stomach, small bowel, colon are unremarkable. There is progression of the oral contrast from the prior scan into the descending colon rectum.  Vascular/Lymphatic: Abdominal aorta is normal caliber no retroperitoneal periportal  lymphadenopathy.  Reproductive: Prostate gland is normal.  Musculoskeletal: There is diffuse gross bone consistent with renal osteodystrophy.  Other: Anasarca  IMPRESSION: 1. Interval decrease in volume of intraperitoneal free fluid volume paracentesis. Moderate volume of ascites remains. 2. No other significant interval change in short interval follow-up from 6:30 a.m. same day 3. No evidence of bowel obstruction. 4. Splenomegaly 5. Evidence of right heart dysfunction with reflux of contrast into hepatic veins. 6. Interlobular septal thickening at the lung bases consistent with interstitial edema.   Electronically Signed   By: Genevive Bi M.D.   On: 08/30/2014 14:42   Ir Venogram Hepatic W Hemodynamic Evaluation  09/05/2014   CLINICAL DATA:  64 year old male with hepatosplenomegaly and abdominal ascites which began after umbilical herniorrhaphy in in July of 2015. Patient has history of biventricular right heart failure with acute right-sided heart failure. Transjugular hepatic venogram with pressure measurements and biopsy warranted to evaluate  for hepatic cirrhosis and portal hypertension.  EXAM: TRANSCATHETER BIOPSY; IR ULTRASOUND GUIDANCE VASC ACCESS RIGHT; VENO HEPATIC W/ HEMODYNAMICS  Date: 09/05/2014  PROCEDURE: 1. Ultrasound-guided puncture of the right internal jugular vein 2. Catheterization of the right hepatic vein with hepatic venogram 3. Balloon occluded right hepatic venogram 4. The free in balloon occluded hepatic venous pressures were recorded 5. Transjugular liver biopsy Interventional Radiologist:  Sterling Big, MD  ANESTHESIA/SEDATION: Moderate (conscious) sedation was used. One mg Versed, 25 mcg Fentanyl were administered intravenously. The patient's vital signs were monitored continuously by radiology nursing throughout the procedure.  Sedation Time: 32 minutes  MEDICATIONS: None  FLUOROSCOPY TIME:  6 min 48 seconds  246.1 mGy  CONTRAST:  20mL OMNIPAQUE IOHEXOL 300 MG/ML  SOLN   TECHNIQUE: Informed consent was obtained from the patient following explanation of the procedure, risks, benefits and alternatives. The patient understands, agrees and consents for the procedure. All questions were addressed. A time out was performed.  Maximal barrier sterile technique utilized including caps, mask, sterile gowns, sterile gloves, large sterile drape, hand hygiene, and Betadine skin prep.  The right neck was interrogated with ultrasound. The internal jugular vein is widely patent and compressible. An image was obtained and stored for the medical record. Local anesthesia was attained by infiltration with 1% lidocaine. A small dermatotomy was made. Under real-time sonographic guidance, the vessel was punctured with a 21 gauge micropuncture needle. With the assistance of a 5 [place] transitional micro sheath, the 0.018 inch wire was exchanged for a Bentson wire. This was navigated through the heart and into the inferior vena cava under fluoroscopic guidance.  The skin tract was then dilated to 10 [place]. A 9 French cook flexor sheath was advanced over the wire and positioned in the right atrium. Through this, and MPA catheter and Bentson wire were used to select the right hepatic vein. The MPA catheter was advanced into the right hepatic vein and right hepatic venogram was performed. The right hepatic vein is widely patent. No evidence of a Budd-Chiari syndrome. The right hepatic vein is relatively robust in size.  The 9 French sheath was advanced into the right hepatic vein followed by a the Fogarty balloon occlusion catheter. The balloon was inflated and a balloon occluded hepatic venogram was performed demonstrating successful occlusion of the distal hepatic vein. Hepatic venous pressure measurements were then recorded. Measurements were obtained serially 3 times to ensure accuracy.  Free hepatic vein:  32/21 (mean 26 mmHg)  Wedged hepatic venous pressure:  33/26 (mean 29 mmHg)  Suprahepatic IVC:   Mean 23 mmHg  Right atrium:  Mean 21 mm Hg  The transjugular biopsy device was then advanced through the sheath and three 18 gauge core biopsies were obtained. Biopsy specimens were placed in formalin. The sheath was removed. Hemostasis was attained with manual pressure. The patient tolerated the procedure well.  COMPLICATIONS: None  IMPRESSION: 1. Patent right hepatic vein.  No evidence of Budd-Chiari. 2. Markedly elevated right heart and free hepatic venous pressures likely secondary to right heart failure. 3. Negative for evidence of portal hypertension. The portosystemic gradient ranged from 3- 8 mmHg which is within normal limits. 4. Successful transjugular random liver biopsy. Signed,  Sterling Big, MD  [facility]  [facility]   Electronically Signed   By: Malachy Moan M.D.   On: 09/05/2014 17:24   Ir Transcatheter Bx  09/05/2014   CLINICAL DATA:  64 year old male with hepatosplenomegaly and abdominal ascites which  began after umbilical herniorrhaphy in in July of 2015. Patient has history of biventricular right heart failure with acute right-sided heart failure. Transjugular hepatic venogram with pressure measurements and biopsy warranted to evaluate for hepatic cirrhosis and portal hypertension.  EXAM: TRANSCATHETER BIOPSY; IR ULTRASOUND GUIDANCE VASC ACCESS RIGHT; VENO HEPATIC W/ HEMODYNAMICS  Date: 09/05/2014  PROCEDURE: 1. Ultrasound-guided puncture of the right internal jugular vein 2. Catheterization of the right hepatic vein with hepatic venogram 3. Balloon occluded right hepatic venogram 4. The free in balloon occluded hepatic venous pressures were recorded 5. Transjugular liver biopsy Interventional Radiologist:  Sterling Big, MD  ANESTHESIA/SEDATION: Moderate (conscious) sedation was used. One mg Versed, 25 mcg Fentanyl were administered intravenously. The patient's vital signs were monitored continuously by radiology nursing  throughout the procedure.  Sedation Time: 32 minutes  MEDICATIONS: None  FLUOROSCOPY TIME:  6 min 48 seconds  246.1 mGy  CONTRAST:  20mL OMNIPAQUE IOHEXOL 300 MG/ML  SOLN  TECHNIQUE: Informed consent was obtained from the patient following explanation of the procedure, risks, benefits and alternatives. The patient understands, agrees and consents for the procedure. All questions were addressed. A time out was performed.  Maximal barrier sterile technique utilized including caps, mask, sterile gowns, sterile gloves, large sterile drape, hand hygiene, and Betadine skin prep.  The right neck was interrogated with ultrasound. The internal jugular vein is widely patent and compressible. An image was obtained and stored for the medical record. Local anesthesia was attained by infiltration with 1% lidocaine. A small dermatotomy was made. Under real-time sonographic guidance, the vessel was punctured with a 21 gauge micropuncture needle. With the assistance of a 5 [place] transitional micro sheath, the 0.018 inch wire was exchanged for a Bentson wire. This was navigated through the heart and into the inferior vena cava under fluoroscopic guidance.  The skin tract was then dilated to 10 [place]. A 9 French cook flexor sheath was advanced over the wire and positioned in the right atrium. Through this, and MPA catheter and Bentson wire were used to select the right hepatic vein. The MPA catheter was advanced into the right hepatic vein and right hepatic venogram was performed. The right hepatic vein is widely patent. No evidence of a Budd-Chiari syndrome. The right hepatic vein is relatively robust in size.  The 9 French sheath was advanced into the right hepatic vein followed by a the Fogarty balloon occlusion catheter. The balloon was inflated and a balloon occluded hepatic venogram was performed demonstrating successful occlusion of the distal hepatic vein. Hepatic venous pressure measurements were then recorded. Measurements  were obtained serially 3 times to ensure accuracy.  Free hepatic vein:  32/21 (mean 26 mmHg)  Wedged hepatic venous pressure:  33/26 (mean 29 mmHg)  Suprahepatic IVC:  Mean 23 mmHg  Right atrium:  Mean 21 mm Hg  The transjugular biopsy device was then advanced through the sheath and three 18 gauge core biopsies were obtained. Biopsy specimens were placed in formalin. The sheath was removed. Hemostasis was attained with manual pressure. The patient tolerated the procedure well.  COMPLICATIONS: None  IMPRESSION: 1. Patent right hepatic vein.  No evidence of Budd-Chiari. 2. Markedly elevated right heart and free hepatic venous pressures likely secondary to right heart failure. 3. Negative for evidence of portal hypertension. The portosystemic gradient ranged from 3- 8 mmHg which is within normal limits. 4. Successful transjugular random liver biopsy. Signed,  Sterling Big, MD  [facility]  [facility]   Electronically  Signed   By: Malachy Moan M.D.   On: 09/05/2014 17:24   US Paracentesis  09/02/2014   INDICATION: Recurrent ascites, shortness of breath, request for paracentesis.  EXAM: ULTRASOUND-GUIDED PARACENTESIS  COMPARISON:  08/30/14.  MEDICATIONS: None.  COMPLICATIONS: None immediate  TECHNIQUE: Informed written consent was obtained from the patient after a discussion of the risks, benefits and alternatives to treatment. A timeout was performed prior to the initiation of the procedure.  Initial ultrasound scanning demonstrates a large amount of ascites within the left lower abdominal quadrant. The left lower abdomen was prepped and draped in the usual sterile fashion. 1% lidocaine was used for local anesthesia. An ultrasound image was saved for documentation purposed. An 8 Fr Safe-T-Centesis catheter was introduced. The paracentesis was performed. The catheter was removed and a dressing was applied. The patient tolerated the procedure well without  immediate post procedural complication.  FINDINGS: A total of approximately 1.7 liters of serous/amber colored fluid was removed. Samples were sent to the laboratory as requested by the clinical team.  IMPRESSION: Successful ultrasound-guided paracentesis yielding 1.7 liters of peritoneal fluid.  Read By:  Pattricia Boss PA-C   Electronically Signed   By: Ruel Favors M.D.   On: 09/02/2014 13:39   US Paracentesis  08/30/2014   INDICATION: Ascites.  EXAM: ULTRASOUND-GUIDED PARACENTESIS  COMPARISON:  None.  MEDICATIONS: 10 cc 1% lidocaine  COMPLICATIONS: None immediate  TECHNIQUE: Informed written consent was obtained from the patient after a discussion of the risks, benefits and alternatives to treatment. A timeout was performed prior to the initiation of the procedure.  Initial ultrasound scanning demonstrates a large amount of ascites within the left lower abdominal quadrant. The left Lower abdomen was prepped and draped in the usual sterile fashion. 1% lidocaine with epinephrine was used for local anesthesia. Under direct ultrasound guidance, a 19 gauge, 7-cm, Yueh catheter was introduced. An ultrasound image was saved for documentation purposed. the paracentesis was performed. The catheter was removed and a dressing was applied. The patient tolerated the procedure well without immediate post procedural complication.  FINDINGS: A total of approximately 1.2 liters of amber fluid was removed. Samples were sent to the laboratory as requested by the clinical team.  IMPRESSION: Successful ultrasound-guided paracentesis yielding 1.2 liters of peritoneal fluid.  Read by Robet Leu [facility]   Electronically Signed   By: Richarda Overlie M.D.   On: 08/30/2014 16:27   Ir US Guide Vasc Access Right  09/05/2014   CLINICAL DATA:  64 year old male with hepatosplenomegaly and abdominal ascites which began after umbilical herniorrhaphy in in July of 2015. Patient has history of biventricular right heart failure with acute  right-sided heart failure. Transjugular hepatic venogram with pressure measurements and biopsy warranted to evaluate for hepatic cirrhosis and portal hypertension.  EXAM: TRANSCATHETER BIOPSY; IR ULTRASOUND GUIDANCE VASC ACCESS RIGHT; VENO HEPATIC W/ HEMODYNAMICS  Date: 09/05/2014  PROCEDURE: 1. Ultrasound-guided puncture of the right internal jugular vein 2. Catheterization of the right hepatic vein with hepatic venogram 3. Balloon occluded right hepatic venogram 4. The free in balloon occluded hepatic venous pressures were recorded 5. Transjugular liver biopsy Interventional Radiologist:  Sterling Big, MD  ANESTHESIA/SEDATION: Moderate (conscious) sedation was used. One mg Versed, 25 mcg Fentanyl were administered intravenously. The patient's vital signs were monitored continuously by radiology nursing throughout the procedure.  Sedation Time: 32 minutes  MEDICATIONS: None  FLUOROSCOPY TIME:  6 min 48 seconds  246.1 mGy  CONTRAST:  20mL OMNIPAQUE IOHEXOL 300 MG/ML  SOLN  TECHNIQUE: Informed consent was obtained from the patient following explanation of the procedure, risks, benefits and alternatives. The patient understands, agrees and consents for the procedure. All questions were addressed. A time out was performed.  Maximal barrier sterile technique utilized including caps, mask, sterile gowns, sterile gloves, large sterile drape, hand hygiene, and Betadine skin prep.  The right neck was interrogated with ultrasound. The internal jugular vein is widely patent and compressible. An image was obtained and stored for the medical record. Local anesthesia was attained by infiltration with 1% lidocaine. A small dermatotomy was made. Under real-time sonographic guidance, the vessel was punctured with a 21 gauge micropuncture needle. With the assistance of a 5 [place] transitional micro sheath, the 0.018 inch wire was exchanged for a Bentson wire. This was navigated through the heart and into the inferior vena  cava under fluoroscopic guidance.  The skin tract was then dilated to 10 [place]. A 9 French cook flexor sheath was advanced over the wire and positioned in the right atrium. Through this, and MPA catheter and Bentson wire were used to select the right hepatic vein. The MPA catheter was advanced into the right hepatic vein and right hepatic venogram was performed. The right hepatic vein is widely patent. No evidence of a Budd-Chiari syndrome. The right hepatic vein is relatively robust in size.  The 9 French sheath was advanced into the right hepatic vein followed by a the Fogarty balloon occlusion catheter. The balloon was inflated and a balloon occluded hepatic venogram was performed demonstrating successful occlusion of the distal hepatic vein. Hepatic venous pressure measurements were then recorded. Measurements were obtained serially 3 times to ensure accuracy.  Free hepatic vein:  32/21 (mean 26 mmHg)  Wedged hepatic venous pressure:  33/26 (mean 29 mmHg)  Suprahepatic IVC:  Mean 23 mmHg  Right atrium:  Mean 21 mm Hg  The transjugular biopsy device was then advanced through the sheath and three 18 gauge core biopsies were obtained. Biopsy specimens were placed in formalin. The sheath was removed. Hemostasis was attained with manual pressure. The patient tolerated the procedure well.  COMPLICATIONS: None  IMPRESSION: 1. Patent right hepatic vein.  No evidence of Budd-Chiari. 2. Markedly elevated right heart and free hepatic venous pressures likely secondary to right heart failure. 3. Negative for evidence of portal hypertension. The portosystemic gradient ranged from 3- 8 mmHg which is within normal limits. 4. Successful transjugular random liver biopsy. Signed,  Sterling Big, MD  [facility]  [facility]   Electronically Signed   By: Malachy Moan M.D.   On: 09/05/2014 17:24   Dg Chest Port 1 View  09/05/2014   CLINICAL DATA:  Heart failure,  cirrhosis and ascites. Cough and congestion.  EXAM: PORTABLE CHEST - 1 VIEW  COMPARISON:  08/30/2014  FINDINGS: Stable chronic areas of pulmonary scarring and interstitial prominence. No overt edema or infiltrate is identified. No evidence of pleural fluid or pneumothorax. There is stable moderate cardiomegaly. Stable mediastinal soft tissue prominence consistent with previously depicted chronic lymphadenopathy and granulomatous disease.  IMPRESSION: Stable pulmonary scarring and interstitial lung disease. Stable cardiomegaly and mediastinal lymphadenopathy.   Electronically Signed   By: Irish Lack M.D.   On: 09/05/2014 08:07   Dg Chest Port 1v Same Day  09/07/2014   CLINICAL DATA:  Cough for 1 week initial evaluation, just completed dialysis  EXAM: PORTABLE CHEST - 1 VIEW SAME DAY  COMPARISON:  09/05/2014  FINDINGS: Severe cardiac silhouette  enlargement. Prominent azygos arch region of the right upper mediastinum corresponding to calcified adenopathy seen on previous CT scans.  Mild vascular congestion. No evidence of pulmonary edema or pneumonia. No pleural effusion or pneumothorax. Stable mild pulmonary parenchymal scarring.  IMPRESSION: No active disease. Stable cardiac enlargement, mediastinal adenopathy, and parenchymal scarring.   Electronically Signed   By: Esperanza Heir M.D.   On: 09/07/2014 14:18    Microbiology: Recent Results (from the past 240 hour(s))  Body fluid culture     Status: None   Collection Time: 09/02/14  1:44 PM  Result Value Ref Range Status   Specimen Description PERITONEAL FLUID  Final   Special Requests NONE  Final   Gram Stain   Final    RARE WBC PRESENT,BOTH PMN AND MONONUCLEAR NO ORGANISMS SEEN Performed at [facility]    Culture   Final    NO GROWTH 3 DAYS Performed at [facility]    Report Status 09/05/2014 FINAL  Final     Labs: Basic Metabolic Panel:  Recent Labs Lab 09/06/14 1505 09/07/14 0527 09/08/14 0650 09/09/14 0403  09/10/14 0552 09/11/14 0630  NA 135 138 138 135 136 136  K 4.2 4.0 3.9 4.2 4.3 4.6  CL 98 97 100 96 99 101  CO2 GLUCOSE 95 78 83 83 85 81  BUN 45* 32* 27* 45* 42* 62*  CREATININE 6.30* 5.12* 4.71* 5.90* 5.57* 7.35*  CALCIUM 8.6 8.6 8.8 8.6 8.7 8.7  PHOS 4.2  --   --   --   --  4.6   Liver Function Tests:  Recent Labs Lab 09/05/14 1700 09/06/14 1505 09/07/14 0527 09/08/14 0650 09/11/14 0630  AST 50*  --  54* 53*  --   ALT 26  --  30 30  --   ALKPHOS 220*  --  218* 227*  --   BILITOT 2.7*  --  2.6* 3.1*  --   PROT 7.0  --  7.2 8.4*  --   ALBUMIN 2.6* 2.6* 2.7* 2.6* 2.6*   CBC:  Recent Labs Lab 09/06/14 1505 09/07/14 0527 09/08/14 0650 09/09/14 0403 09/11/14 0823  WBC 4.6 4.3 4.3 4.5 4.2  HGB 13.2 13.8 13.8 14.7 13.8  HCT 37.8* 40.2 39.8 42.4 40.3  MCV 79.9 82.7 82.2 82.7 80.1  PLT 98* 97* 107* 106* 122*    BNP (last 3 results)  Recent Labs  08/29/14 1421  PROBNP 1830.0*   Signed:  Stephani Police, PA-C  [facility] 09/12/2014, 12:28 PM  Patient was seen, examined,treatment plan was discussed with the Advance Practice Provider.  I have directly reviewed the clinical findings, lab, imaging studies and management of this patient in detail. I have made the necessary changes to the above noted documentation, and agree with the documentation, as recorded by the Advance Practice Provider.   Pamella Pert, MD [facility] [phone]

## 2014-09-12 NOTE — Progress Notes (Signed)
Physical Therapy Treatment Patient Details Name: Trevor [name] MRN: [id number] DOB: Dec 24, 1950 Today's Date: 09/12/2014    History of Present Illness 64 yo male with new abd pain from ascites, non EtOH cirrhosis, HD for management of liver.  PMHx:  gout, a-fib, CAD, CHF, PVD, MI    PT Comments    Progressing steadily.  Today gait was mildly unsteady, but pt did not need RW.  Pt may vary from day to day and need a device sometime, but unable to determine today.  Will have HHPT decide based on function in the home.  Follow Up Recommendations  Home health PT;Other (comment) (pt says he will go home with his "girl" to stay with him)     Equipment Recommendations  None recommended by PT    Recommendations for Other Services       Precautions / Restrictions Precautions Precautions: Fall    Mobility  Bed Mobility Overal bed mobility: Modified Independent             General bed mobility comments: "pops" up without assist  Transfers Overall transfer level: Needs assistance   Transfers: Sit to/from Stand Sit to Stand: Supervision         General transfer comment: generally safe technique  Ambulation/Gait Ambulation/Gait assistance: Min guard Ambulation Distance (Feet): 400 Feet Assistive device: None Gait Pattern/deviations: Step-through pattern   Gait velocity interpretation: Below normal speed for age/gender General Gait Details: mildly unsteady throughout with mild scissoring and wandering to maintain balance.  Pt self recovered without need for assist   Stairs Stairs: Yes Stairs assistance: Supervision Stair Management: One rail Right;Alternating pattern;Forwards Number of Stairs: 5 General stair comments: safe holding to rail  Wheelchair Mobility    Modified Rankin (Stroke Patients Only)       Balance Overall balance assessment: Needs assistance Sitting-balance support: No upper extremity supported;Feet supported Sitting balance-Leahy Scale:  Good     Standing balance support: No upper extremity supported Standing balance-Leahy Scale: Fair                      Cognition Arousal/Alertness: Awake/alert Behavior During Therapy: WFL for tasks assessed/performed Overall Cognitive Status: Within Functional Limits for tasks assessed                      Exercises General Exercises - Lower Extremity Hip ABduction/ADduction: AROM;Strengthening;Both;10 reps;Standing Hip Flexion/Marching: AROM;Strengthening;Both;10 reps;Standing Toe Raises: AROM;Both;Strengthening;15 reps;Standing Heel Raises: AROM;AAROM;Both;10 reps;Standing Mini-Sqauts: AROM;Both;10 reps;Standing Other Exercises Other Exercises: modifide push up leaning into hands on sink   (in standing)    General Comments        Pertinent Vitals/Pain Pain Assessment: No/denies pain    Home Living                      Prior Function            PT Goals (current goals can now be found in the care plan section) Acute Rehab PT Goals Patient Stated Goal: to walk alone and go home Time For Goal Achievement: 09/23/14 Potential to Achieve Goals: Good Progress towards PT goals: Progressing toward goals    Frequency  Min 3X/week    PT Plan Current plan remains appropriate    Co-evaluation             End of Session Equipment Utilized During Treatment: Other (comment) Activity Tolerance: Patient limited by fatigue Patient left: with call bell/phone within reach;in bed  Time: 1610-9604 PT Time Calculation (min) (ACUTE ONLY): 22 min  Charges:  $Gait Training: 8-22 mins                    G Codes:      Laszlo Ellerby, Eliseo Gum 09/12/2014, 12:05 PM   09/12/2014  [place] Bing, PT [phone]  (pager)

## 2014-09-13 DIAGNOSIS — D631 Anemia in chronic kidney disease: Secondary | ICD-10-CM | POA: Diagnosis not present

## 2014-09-13 DIAGNOSIS — N2581 Secondary hyperparathyroidism of renal origin: Secondary | ICD-10-CM | POA: Diagnosis not present

## 2014-09-13 DIAGNOSIS — N186 End stage renal disease: Secondary | ICD-10-CM | POA: Diagnosis not present

## 2014-09-15 DIAGNOSIS — N2581 Secondary hyperparathyroidism of renal origin: Secondary | ICD-10-CM | POA: Diagnosis not present

## 2014-09-15 DIAGNOSIS — I509 Heart failure, unspecified: Secondary | ICD-10-CM | POA: Diagnosis not present

## 2014-09-15 DIAGNOSIS — K7460 Unspecified cirrhosis of liver: Secondary | ICD-10-CM | POA: Diagnosis not present

## 2014-09-15 DIAGNOSIS — I4891 Unspecified atrial fibrillation: Secondary | ICD-10-CM | POA: Diagnosis not present

## 2014-09-15 DIAGNOSIS — R188 Other ascites: Secondary | ICD-10-CM | POA: Diagnosis not present

## 2014-09-15 DIAGNOSIS — I129 Hypertensive chronic kidney disease with stage 1 through stage 4 chronic kidney disease, or unspecified chronic kidney disease: Secondary | ICD-10-CM | POA: Diagnosis not present

## 2014-09-15 DIAGNOSIS — D631 Anemia in chronic kidney disease: Secondary | ICD-10-CM | POA: Diagnosis not present

## 2014-09-15 DIAGNOSIS — G4733 Obstructive sleep apnea (adult) (pediatric): Secondary | ICD-10-CM | POA: Diagnosis not present

## 2014-09-15 DIAGNOSIS — Z992 Dependence on renal dialysis: Secondary | ICD-10-CM | POA: Diagnosis not present

## 2014-09-15 DIAGNOSIS — N186 End stage renal disease: Secondary | ICD-10-CM | POA: Diagnosis not present

## 2014-09-18 DIAGNOSIS — D631 Anemia in chronic kidney disease: Secondary | ICD-10-CM | POA: Diagnosis not present

## 2014-09-18 DIAGNOSIS — N186 End stage renal disease: Secondary | ICD-10-CM | POA: Diagnosis not present

## 2014-09-18 DIAGNOSIS — N2581 Secondary hyperparathyroidism of renal origin: Secondary | ICD-10-CM | POA: Diagnosis not present

## 2014-09-19 DIAGNOSIS — K7460 Unspecified cirrhosis of liver: Secondary | ICD-10-CM | POA: Diagnosis not present

## 2014-09-19 DIAGNOSIS — R188 Other ascites: Secondary | ICD-10-CM | POA: Diagnosis not present

## 2014-09-19 DIAGNOSIS — I509 Heart failure, unspecified: Secondary | ICD-10-CM | POA: Diagnosis not present

## 2014-09-19 DIAGNOSIS — I129 Hypertensive chronic kidney disease with stage 1 through stage 4 chronic kidney disease, or unspecified chronic kidney disease: Secondary | ICD-10-CM | POA: Diagnosis not present

## 2014-09-19 DIAGNOSIS — I4891 Unspecified atrial fibrillation: Secondary | ICD-10-CM | POA: Diagnosis not present

## 2014-09-19 DIAGNOSIS — N186 End stage renal disease: Secondary | ICD-10-CM | POA: Diagnosis not present

## 2014-09-20 DIAGNOSIS — D631 Anemia in chronic kidney disease: Secondary | ICD-10-CM | POA: Diagnosis not present

## 2014-09-20 DIAGNOSIS — N2581 Secondary hyperparathyroidism of renal origin: Secondary | ICD-10-CM | POA: Diagnosis not present

## 2014-09-20 DIAGNOSIS — N186 End stage renal disease: Secondary | ICD-10-CM | POA: Diagnosis not present

## 2014-09-21 DIAGNOSIS — I4891 Unspecified atrial fibrillation: Secondary | ICD-10-CM | POA: Diagnosis not present

## 2014-09-21 DIAGNOSIS — N186 End stage renal disease: Secondary | ICD-10-CM | POA: Diagnosis not present

## 2014-09-21 DIAGNOSIS — R188 Other ascites: Secondary | ICD-10-CM | POA: Diagnosis not present

## 2014-09-21 DIAGNOSIS — I129 Hypertensive chronic kidney disease with stage 1 through stage 4 chronic kidney disease, or unspecified chronic kidney disease: Secondary | ICD-10-CM | POA: Diagnosis not present

## 2014-09-21 DIAGNOSIS — I509 Heart failure, unspecified: Secondary | ICD-10-CM | POA: Diagnosis not present

## 2014-09-21 DIAGNOSIS — K7460 Unspecified cirrhosis of liver: Secondary | ICD-10-CM | POA: Diagnosis not present

## 2014-09-22 DIAGNOSIS — N186 End stage renal disease: Secondary | ICD-10-CM | POA: Diagnosis not present

## 2014-09-22 DIAGNOSIS — D631 Anemia in chronic kidney disease: Secondary | ICD-10-CM | POA: Diagnosis not present

## 2014-09-22 DIAGNOSIS — N2581 Secondary hyperparathyroidism of renal origin: Secondary | ICD-10-CM | POA: Diagnosis not present

## 2014-09-25 DIAGNOSIS — N2581 Secondary hyperparathyroidism of renal origin: Secondary | ICD-10-CM | POA: Diagnosis not present

## 2014-09-25 DIAGNOSIS — D631 Anemia in chronic kidney disease: Secondary | ICD-10-CM | POA: Diagnosis not present

## 2014-09-25 DIAGNOSIS — N186 End stage renal disease: Secondary | ICD-10-CM | POA: Diagnosis not present

## 2014-09-27 DIAGNOSIS — D631 Anemia in chronic kidney disease: Secondary | ICD-10-CM | POA: Diagnosis not present

## 2014-09-27 DIAGNOSIS — N186 End stage renal disease: Secondary | ICD-10-CM | POA: Diagnosis not present

## 2014-09-27 DIAGNOSIS — N2581 Secondary hyperparathyroidism of renal origin: Secondary | ICD-10-CM | POA: Diagnosis not present

## 2014-09-28 DIAGNOSIS — I4891 Unspecified atrial fibrillation: Secondary | ICD-10-CM | POA: Diagnosis not present

## 2014-09-28 DIAGNOSIS — N186 End stage renal disease: Secondary | ICD-10-CM | POA: Diagnosis not present

## 2014-09-28 DIAGNOSIS — I129 Hypertensive chronic kidney disease with stage 1 through stage 4 chronic kidney disease, or unspecified chronic kidney disease: Secondary | ICD-10-CM | POA: Diagnosis not present

## 2014-09-28 DIAGNOSIS — I509 Heart failure, unspecified: Secondary | ICD-10-CM | POA: Diagnosis not present

## 2014-09-28 DIAGNOSIS — R188 Other ascites: Secondary | ICD-10-CM | POA: Diagnosis not present

## 2014-09-28 DIAGNOSIS — K7460 Unspecified cirrhosis of liver: Secondary | ICD-10-CM | POA: Diagnosis not present

## 2014-09-29 DIAGNOSIS — N186 End stage renal disease: Secondary | ICD-10-CM | POA: Diagnosis not present

## 2014-09-29 DIAGNOSIS — N2581 Secondary hyperparathyroidism of renal origin: Secondary | ICD-10-CM | POA: Diagnosis not present

## 2014-09-29 DIAGNOSIS — D631 Anemia in chronic kidney disease: Secondary | ICD-10-CM | POA: Diagnosis not present

## 2014-10-02 DIAGNOSIS — N186 End stage renal disease: Secondary | ICD-10-CM | POA: Diagnosis not present

## 2014-10-02 DIAGNOSIS — D631 Anemia in chronic kidney disease: Secondary | ICD-10-CM | POA: Diagnosis not present

## 2014-10-02 DIAGNOSIS — N2581 Secondary hyperparathyroidism of renal origin: Secondary | ICD-10-CM | POA: Diagnosis not present

## 2014-10-03 DIAGNOSIS — I4891 Unspecified atrial fibrillation: Secondary | ICD-10-CM | POA: Diagnosis not present

## 2014-10-03 DIAGNOSIS — K7460 Unspecified cirrhosis of liver: Secondary | ICD-10-CM | POA: Diagnosis not present

## 2014-10-03 DIAGNOSIS — I509 Heart failure, unspecified: Secondary | ICD-10-CM | POA: Diagnosis not present

## 2014-10-03 DIAGNOSIS — N186 End stage renal disease: Secondary | ICD-10-CM | POA: Diagnosis not present

## 2014-10-03 DIAGNOSIS — R188 Other ascites: Secondary | ICD-10-CM | POA: Diagnosis not present

## 2014-10-03 DIAGNOSIS — I129 Hypertensive chronic kidney disease with stage 1 through stage 4 chronic kidney disease, or unspecified chronic kidney disease: Secondary | ICD-10-CM | POA: Diagnosis not present

## 2014-10-04 DIAGNOSIS — N2581 Secondary hyperparathyroidism of renal origin: Secondary | ICD-10-CM | POA: Diagnosis not present

## 2014-10-04 DIAGNOSIS — D631 Anemia in chronic kidney disease: Secondary | ICD-10-CM | POA: Diagnosis not present

## 2014-10-04 DIAGNOSIS — N186 End stage renal disease: Secondary | ICD-10-CM | POA: Diagnosis not present

## 2014-10-05 ENCOUNTER — Ambulatory Visit (INDEPENDENT_AMBULATORY_CARE_PROVIDER_SITE_OTHER): Payer: Medicare Other | Admitting: Gastroenterology

## 2014-10-05 ENCOUNTER — Encounter: Payer: Self-pay | Admitting: Gastroenterology

## 2014-10-05 VITALS — BP 126/84 | HR 76 | Ht 73.5 in | Wt 194.0 lb

## 2014-10-05 DIAGNOSIS — K7469 Other cirrhosis of liver: Secondary | ICD-10-CM | POA: Diagnosis not present

## 2014-10-05 NOTE — Progress Notes (Signed)
     10/05/2014 Trevor [name] [id number] [birth year]   History of Present Illness:  This is a 64 year old male with multiple medical problems including ESRD on HD MWF, bi-ventricular heart failure, and now new diagnosis of cirrhosis who is here today for hospital follow-up.  He was seen by GI for abdominal pain and ascites.  Had paracentesis x 2 after which his abdominal pain resolved.  He had a liver biopsy, but cause is still unclear; suggestion for possible fatty liver component.  He has not experienced any further abdominal pain since his hospital discharge.  Says that his weight has been stable.  No other GI complaints.  Last INR on 09/05/2014 was 1.30.  There is no sign of portal hypertension via hepatic vein wedge pressure measurement.  Last colonoscopy was 10/2005 at which time he was found to have only diverticulosis.   Current Medications, Allergies, Past Medical History, Past Surgical History, Family History and Social History were reviewed in [employer] record.   Physical Exam: BP 126/84 mmHg  Pulse 76  Ht 6' 1.5" (1.867 m)  Wt 194 lb (87.998 kg)  BMI 25.25 kg/m2 General: Well developed black male in no acute distress Head: Normocephalic and atraumatic Eyes:  Sclerae anicteric, conjunctiva pink  Ears: Normal auditory acuity Lungs: Clear throughout to auscultation Heart: Regular rate and rhythm Abdomen: Soft, minimal distention.  Normal bowel sounds.  Non-tender. Musculoskeletal: Symmetrical with no gross deformities  Extremities: No edema  Neurological: Alert oriented x 4, grossly non-focal Psychological:  Alert and cooperative. Normal mood and affect  Assessment and Recommendations: -Cirrhosis:  Etiology not clear.  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  Weight remains stable per patient report.  He is  not on diuretics due to dialysis.  Fluid/volume management per dialysis/renal. -Chronic abd pain--much improved after paracentesis and remains improved. -Hepatosplenomegaly:  Transjugular bx/ hepatic vein wedge pressure measurement noted to have elevated right heart and free hepatic venous pressures likely due to right heart failure.  No evidence of portal hypertension.  -Right lobe liver lesion:  Felt to be hemangioma. -Thrombocytopenia:  Due to cirrhosis. -ESRD:  MWF HD. -Biventricular heart failure, acute right sided heart failure  *Patient was asking about colonoscopy.  He is not due for 10 year recall until 10/2015, however, he is very high risk for procedures and I do not think that he is the best candidate for screening studies.  This was discussed with him while he was in the hospital as well. *Will need labs monitored including CBC, LFT's, coags.   *Will follow-up with Dr. Arlyce Dice in 3 months to assess stability.

## 2014-10-05 NOTE — Patient Instructions (Signed)
Please follow up with Dr. Kaplan in 3 months. 

## 2014-10-05 NOTE — Progress Notes (Signed)
Reviewed and agree with management. Robert D. Kaplan, M.D., FACG  

## 2014-10-06 DIAGNOSIS — N186 End stage renal disease: Secondary | ICD-10-CM | POA: Diagnosis not present

## 2014-10-06 DIAGNOSIS — D631 Anemia in chronic kidney disease: Secondary | ICD-10-CM | POA: Diagnosis not present

## 2014-10-06 DIAGNOSIS — N2581 Secondary hyperparathyroidism of renal origin: Secondary | ICD-10-CM | POA: Diagnosis not present

## 2014-10-08 DIAGNOSIS — Z992 Dependence on renal dialysis: Secondary | ICD-10-CM | POA: Diagnosis not present

## 2014-10-08 DIAGNOSIS — N186 End stage renal disease: Secondary | ICD-10-CM | POA: Diagnosis not present

## 2014-10-09 DIAGNOSIS — N186 End stage renal disease: Secondary | ICD-10-CM | POA: Diagnosis not present

## 2014-10-09 DIAGNOSIS — D631 Anemia in chronic kidney disease: Secondary | ICD-10-CM | POA: Diagnosis not present

## 2014-10-09 DIAGNOSIS — N2581 Secondary hyperparathyroidism of renal origin: Secondary | ICD-10-CM | POA: Diagnosis not present

## 2014-10-10 DIAGNOSIS — I509 Heart failure, unspecified: Secondary | ICD-10-CM | POA: Diagnosis not present

## 2014-10-10 DIAGNOSIS — I129 Hypertensive chronic kidney disease with stage 1 through stage 4 chronic kidney disease, or unspecified chronic kidney disease: Secondary | ICD-10-CM | POA: Diagnosis not present

## 2014-10-10 DIAGNOSIS — N186 End stage renal disease: Secondary | ICD-10-CM | POA: Diagnosis not present

## 2014-10-10 DIAGNOSIS — R188 Other ascites: Secondary | ICD-10-CM | POA: Diagnosis not present

## 2014-10-10 DIAGNOSIS — K7460 Unspecified cirrhosis of liver: Secondary | ICD-10-CM | POA: Diagnosis not present

## 2014-10-10 DIAGNOSIS — I4891 Unspecified atrial fibrillation: Secondary | ICD-10-CM | POA: Diagnosis not present

## 2014-10-12 LAB — AFB CULTURE WITH SMEAR (NOT AT ARMC): ACID FAST SMEAR: NONE SEEN

## 2014-10-24 DIAGNOSIS — N186 End stage renal disease: Secondary | ICD-10-CM | POA: Diagnosis not present

## 2014-10-24 DIAGNOSIS — I509 Heart failure, unspecified: Secondary | ICD-10-CM | POA: Diagnosis not present

## 2014-10-24 DIAGNOSIS — R188 Other ascites: Secondary | ICD-10-CM | POA: Diagnosis not present

## 2014-10-24 DIAGNOSIS — I4891 Unspecified atrial fibrillation: Secondary | ICD-10-CM | POA: Diagnosis not present

## 2014-10-24 DIAGNOSIS — I129 Hypertensive chronic kidney disease with stage 1 through stage 4 chronic kidney disease, or unspecified chronic kidney disease: Secondary | ICD-10-CM | POA: Diagnosis not present

## 2014-10-24 DIAGNOSIS — K7460 Unspecified cirrhosis of liver: Secondary | ICD-10-CM | POA: Diagnosis not present

## 2014-11-06 DIAGNOSIS — Z992 Dependence on renal dialysis: Secondary | ICD-10-CM | POA: Diagnosis not present

## 2014-11-06 DIAGNOSIS — N186 End stage renal disease: Secondary | ICD-10-CM | POA: Diagnosis not present

## 2014-11-08 DIAGNOSIS — N2581 Secondary hyperparathyroidism of renal origin: Secondary | ICD-10-CM | POA: Diagnosis not present

## 2014-11-08 DIAGNOSIS — N186 End stage renal disease: Secondary | ICD-10-CM | POA: Diagnosis not present

## 2014-11-08 DIAGNOSIS — D631 Anemia in chronic kidney disease: Secondary | ICD-10-CM | POA: Diagnosis not present

## 2014-11-17 ENCOUNTER — Encounter: Payer: Self-pay | Admitting: Cardiology

## 2014-11-28 ENCOUNTER — Ambulatory Visit: Payer: Self-pay | Admitting: Cardiology

## 2014-12-07 DIAGNOSIS — N186 End stage renal disease: Secondary | ICD-10-CM | POA: Diagnosis not present

## 2014-12-07 DIAGNOSIS — Z992 Dependence on renal dialysis: Secondary | ICD-10-CM | POA: Diagnosis not present

## 2014-12-08 DIAGNOSIS — N186 End stage renal disease: Secondary | ICD-10-CM | POA: Diagnosis not present

## 2014-12-08 DIAGNOSIS — D631 Anemia in chronic kidney disease: Secondary | ICD-10-CM | POA: Diagnosis not present

## 2014-12-08 DIAGNOSIS — N2581 Secondary hyperparathyroidism of renal origin: Secondary | ICD-10-CM | POA: Diagnosis not present

## 2014-12-28 ENCOUNTER — Telehealth: Payer: Self-pay | Admitting: Internal Medicine

## 2015-01-03 NOTE — Telephone Encounter (Signed)
Received a call from Jeananne Rama, PA-C - original liver bx report reported as cirrhosis and this was corrected recently. Correction received.  Would perceive that CHF and ESRD cause of ascites.

## 2015-01-03 NOTE — Progress Notes (Signed)
Quick Note:  Corrected liver bx - does not have cirrhosi ______

## 2015-01-06 DIAGNOSIS — N186 End stage renal disease: Secondary | ICD-10-CM | POA: Diagnosis not present

## 2015-01-06 DIAGNOSIS — I10 Essential (primary) hypertension: Secondary | ICD-10-CM | POA: Diagnosis not present

## 2015-01-06 DIAGNOSIS — Z992 Dependence on renal dialysis: Secondary | ICD-10-CM | POA: Diagnosis not present

## 2015-01-08 DIAGNOSIS — N2581 Secondary hyperparathyroidism of renal origin: Secondary | ICD-10-CM | POA: Diagnosis not present

## 2015-01-08 DIAGNOSIS — N186 End stage renal disease: Secondary | ICD-10-CM | POA: Diagnosis not present

## 2015-01-18 DIAGNOSIS — T82858D Stenosis of vascular prosthetic devices, implants and grafts, subsequent encounter: Secondary | ICD-10-CM | POA: Diagnosis not present

## 2015-01-18 DIAGNOSIS — N186 End stage renal disease: Secondary | ICD-10-CM | POA: Diagnosis not present

## 2015-01-18 DIAGNOSIS — Z992 Dependence on renal dialysis: Secondary | ICD-10-CM | POA: Diagnosis not present

## 2015-01-18 DIAGNOSIS — I871 Compression of vein: Secondary | ICD-10-CM | POA: Diagnosis not present

## 2015-01-30 ENCOUNTER — Other Ambulatory Visit (HOSPITAL_COMMUNITY): Payer: Self-pay | Admitting: Nephrology

## 2015-01-30 DIAGNOSIS — R188 Other ascites: Secondary | ICD-10-CM

## 2015-01-31 ENCOUNTER — Other Ambulatory Visit (HOSPITAL_COMMUNITY): Payer: Self-pay | Admitting: Nephrology

## 2015-02-06 ENCOUNTER — Ambulatory Visit (HOSPITAL_COMMUNITY)
Admission: RE | Admit: 2015-02-06 | Discharge: 2015-02-06 | Disposition: A | Discharge status: Home / Self Care | Payer: Medicare Other | Source: Ambulatory Visit | Attending: Nephrology | Admitting: Nephrology

## 2015-02-06 ENCOUNTER — Other Ambulatory Visit (HOSPITAL_COMMUNITY): Payer: Self-pay | Admitting: Nephrology

## 2015-02-06 DIAGNOSIS — R188 Other ascites: Secondary | ICD-10-CM

## 2015-02-06 DIAGNOSIS — I10 Essential (primary) hypertension: Secondary | ICD-10-CM | POA: Diagnosis not present

## 2015-02-06 DIAGNOSIS — N186 End stage renal disease: Secondary | ICD-10-CM | POA: Diagnosis not present

## 2015-02-06 MED ORDER — LIDOCAINE HCL (PF) 1 % IJ SOLN
INTRAMUSCULAR | Status: AC
  Filled 2015-02-06: qty 10

## 2015-02-07 DIAGNOSIS — N186 End stage renal disease: Secondary | ICD-10-CM | POA: Diagnosis not present

## 2015-02-07 DIAGNOSIS — N2581 Secondary hyperparathyroidism of renal origin: Secondary | ICD-10-CM | POA: Diagnosis not present

## 2015-02-07 DIAGNOSIS — D631 Anemia in chronic kidney disease: Secondary | ICD-10-CM | POA: Diagnosis not present

## 2015-03-08 DIAGNOSIS — I10 Essential (primary) hypertension: Secondary | ICD-10-CM | POA: Diagnosis not present

## 2015-03-08 DIAGNOSIS — Z992 Dependence on renal dialysis: Secondary | ICD-10-CM | POA: Diagnosis not present

## 2015-03-08 DIAGNOSIS — N186 End stage renal disease: Secondary | ICD-10-CM | POA: Diagnosis not present

## 2015-03-09 DIAGNOSIS — D631 Anemia in chronic kidney disease: Secondary | ICD-10-CM | POA: Diagnosis not present

## 2015-03-09 DIAGNOSIS — N186 End stage renal disease: Secondary | ICD-10-CM | POA: Diagnosis not present

## 2015-03-09 DIAGNOSIS — N2581 Secondary hyperparathyroidism of renal origin: Secondary | ICD-10-CM | POA: Diagnosis not present

## 2015-03-17 DIAGNOSIS — N186 End stage renal disease: Secondary | ICD-10-CM | POA: Diagnosis not present

## 2015-03-19 ENCOUNTER — Ambulatory Visit: Payer: Medicare Other | Admitting: Gastroenterology

## 2015-03-19 DIAGNOSIS — N2581 Secondary hyperparathyroidism of renal origin: Secondary | ICD-10-CM | POA: Diagnosis not present

## 2015-03-19 DIAGNOSIS — N186 End stage renal disease: Secondary | ICD-10-CM | POA: Diagnosis not present

## 2015-03-21 DIAGNOSIS — N186 End stage renal disease: Secondary | ICD-10-CM | POA: Diagnosis not present

## 2015-03-21 DIAGNOSIS — N2581 Secondary hyperparathyroidism of renal origin: Secondary | ICD-10-CM | POA: Diagnosis not present

## 2015-04-08 DIAGNOSIS — I10 Essential (primary) hypertension: Secondary | ICD-10-CM | POA: Diagnosis not present

## 2015-04-08 DIAGNOSIS — N186 End stage renal disease: Secondary | ICD-10-CM | POA: Diagnosis not present

## 2015-04-08 DIAGNOSIS — Z992 Dependence on renal dialysis: Secondary | ICD-10-CM | POA: Diagnosis not present

## 2015-04-09 DIAGNOSIS — D631 Anemia in chronic kidney disease: Secondary | ICD-10-CM | POA: Diagnosis not present

## 2015-04-09 DIAGNOSIS — N186 End stage renal disease: Secondary | ICD-10-CM | POA: Diagnosis not present

## 2015-04-09 DIAGNOSIS — N2581 Secondary hyperparathyroidism of renal origin: Secondary | ICD-10-CM | POA: Diagnosis not present

## 2015-04-19 ENCOUNTER — Encounter: Payer: Self-pay | Admitting: Gastroenterology

## 2015-04-19 ENCOUNTER — Ambulatory Visit (INDEPENDENT_AMBULATORY_CARE_PROVIDER_SITE_OTHER): Payer: Medicare Other | Admitting: Gastroenterology

## 2015-04-19 ENCOUNTER — Ambulatory Visit (INDEPENDENT_AMBULATORY_CARE_PROVIDER_SITE_OTHER)
Admission: RE | Admit: 2015-04-19 | Discharge: 2015-04-19 | Disposition: A | Discharge status: Home / Self Care | Payer: Medicare Other | Source: Ambulatory Visit | Attending: Gastroenterology | Admitting: Gastroenterology

## 2015-04-19 VITALS — BP 138/62 | HR 80 | Ht 70.47 in | Wt 201.5 lb

## 2015-04-19 DIAGNOSIS — K7460 Unspecified cirrhosis of liver: Secondary | ICD-10-CM | POA: Insufficient documentation

## 2015-04-19 DIAGNOSIS — K429 Umbilical hernia without obstruction or gangrene: Secondary | ICD-10-CM | POA: Diagnosis not present

## 2015-04-19 DIAGNOSIS — R188 Other ascites: Secondary | ICD-10-CM

## 2015-04-19 DIAGNOSIS — R1084 Generalized abdominal pain: Secondary | ICD-10-CM

## 2015-04-19 DIAGNOSIS — K7469 Other cirrhosis of liver: Secondary | ICD-10-CM

## 2015-04-19 DIAGNOSIS — R140 Abdominal distension (gaseous): Secondary | ICD-10-CM | POA: Insufficient documentation

## 2015-04-19 MED ORDER — LUBIPROSTONE 8 MCG PO CAPS: 8.0000 ug | ORAL_CAPSULE | Freq: Two times a day (BID) | ORAL | Status: DC

## 2015-04-19 NOTE — Patient Instructions (Addendum)
We are sending in your prescription to your pharmacy Call back in 10 days to report progress Go to the basement for your xrays today

## 2015-04-19 NOTE — Progress Notes (Signed)
      History of Present Illness:  [name]  has returned with his only major complaint of bloating.  He claims that bloating is constant, perhaps worsened postprandially.  He is without nausea.  He moves his bowels regularly.  Gastric empty scan in 2014 was normal.  He's tried Gas-X and various other over-the-counter medicines without much relief.   Review of Systems: Pertinent positive and negative review of systems were noted in the above HPI section. All other review of systems were otherwise negative.    Current Medications, Allergies, Past Medical History, Past Surgical History, Family History and Social History were reviewed in [employer] electronic medical record  Vital signs were reviewed in today's medical record. Physical Exam: General: Well developed , well nourished, no acute distress Skin: anicteric Head: Normocephalic and atraumatic Eyes:  sclerae anicteric, EOMI Ears: Normal auditory acuity Mouth: No deformity or lesions Lymph Nodes: no lymphadenopathy Lungs: Clear throughout to auscultation Heart: Regular rate and rhythm; no murmurs, rubs or brui: Gastroinestinal:  Distended, tympanitic but nontender.  No masses, hepatosplenomegaly or hernias noted. Normal Bowel sounds Rectal:deferred Musculoskeletal: Symmetrical with no gross deformities  Pulses:  Normal pulses noted Extremities: No clubbing, cyanosis, edema or deformities noted Neurological: Alert oriented x 4, grossly nonfocal Psychological:  Alert and cooperative. Normal mood and affect  See Assessment and Plan under Problem List

## 2015-04-19 NOTE — Assessment & Plan Note (Signed)
Ultrasound in May showed a small amount of ascites.  This does not need to be treated.

## 2015-04-19 NOTE — Assessment & Plan Note (Signed)
Isolated complaints of bloating.  He may have hypomotility, perhaps related to his cardiac disease although the latter is seems stable.  There is nothing to suggest obstruction.  Gastroparesis has been ruled out.  Recommendations #1 check KUB #2 trial of Amitiza 8 g twice a day

## 2015-04-19 NOTE — Assessment & Plan Note (Signed)
Stable hepatic function   

## 2015-05-04 ENCOUNTER — Telehealth: Payer: Self-pay | Admitting: Gastroenterology

## 2015-05-04 NOTE — Telephone Encounter (Signed)
Amitiza cause his stomach to "knot up and hurt". He does not plan to take it again. Abdominal imaging was normal. I advised him. Next step in treating his upper and lower gas issues?

## 2015-05-08 ENCOUNTER — Other Ambulatory Visit: Payer: Self-pay

## 2015-05-08 MED ORDER — FAMOTIDINE 40 MG PO TABS: 40.0000 mg | ORAL_TABLET | Freq: Every day | ORAL | Status: DC

## 2015-05-08 NOTE — Telephone Encounter (Signed)
Begin pepcid 40 mg qd.c/b one week

## 2015-05-08 NOTE — Telephone Encounter (Signed)
Patient instructed and agrees to this plan of care. Rx to [facility].

## 2015-05-09 DIAGNOSIS — I10 Essential (primary) hypertension: Secondary | ICD-10-CM | POA: Diagnosis not present

## 2015-05-09 DIAGNOSIS — Z992 Dependence on renal dialysis: Secondary | ICD-10-CM | POA: Diagnosis not present

## 2015-05-09 DIAGNOSIS — N186 End stage renal disease: Secondary | ICD-10-CM | POA: Diagnosis not present

## 2015-05-11 DIAGNOSIS — N2581 Secondary hyperparathyroidism of renal origin: Secondary | ICD-10-CM | POA: Diagnosis not present

## 2015-05-11 DIAGNOSIS — D631 Anemia in chronic kidney disease: Secondary | ICD-10-CM | POA: Diagnosis not present

## 2015-05-11 DIAGNOSIS — G990 Autonomic neuropathy in diseases classified elsewhere: Secondary | ICD-10-CM | POA: Diagnosis not present

## 2015-05-11 DIAGNOSIS — N186 End stage renal disease: Secondary | ICD-10-CM | POA: Diagnosis not present

## 2015-05-17 NOTE — Telephone Encounter (Signed)
Call to the patient for follow up since starting the Pepcid 40 mg daily. He states he is not better. Offered an appointment today or next Thursday 05/24/15. He declines. States he will call back to schedule. He needs to confirm some other appointments he has coming up.

## 2015-06-08 DIAGNOSIS — I10 Essential (primary) hypertension: Secondary | ICD-10-CM | POA: Diagnosis not present

## 2015-06-08 DIAGNOSIS — Z992 Dependence on renal dialysis: Secondary | ICD-10-CM | POA: Diagnosis not present

## 2015-06-08 DIAGNOSIS — N186 End stage renal disease: Secondary | ICD-10-CM | POA: Diagnosis not present

## 2015-06-11 DIAGNOSIS — N2581 Secondary hyperparathyroidism of renal origin: Secondary | ICD-10-CM | POA: Diagnosis not present

## 2015-06-11 DIAGNOSIS — N186 End stage renal disease: Secondary | ICD-10-CM | POA: Diagnosis not present

## 2015-07-09 DIAGNOSIS — Z992 Dependence on renal dialysis: Secondary | ICD-10-CM | POA: Diagnosis not present

## 2015-07-09 DIAGNOSIS — N186 End stage renal disease: Secondary | ICD-10-CM | POA: Diagnosis not present

## 2015-07-09 DIAGNOSIS — I10 Essential (primary) hypertension: Secondary | ICD-10-CM | POA: Diagnosis not present

## 2015-07-11 DIAGNOSIS — D631 Anemia in chronic kidney disease: Secondary | ICD-10-CM | POA: Diagnosis not present

## 2015-07-11 DIAGNOSIS — N186 End stage renal disease: Secondary | ICD-10-CM | POA: Diagnosis not present

## 2015-07-11 DIAGNOSIS — N2581 Secondary hyperparathyroidism of renal origin: Secondary | ICD-10-CM | POA: Diagnosis not present

## 2015-07-31 DIAGNOSIS — M2011 Hallux valgus (acquired), right foot: Secondary | ICD-10-CM | POA: Diagnosis not present

## 2015-07-31 DIAGNOSIS — M2012 Hallux valgus (acquired), left foot: Secondary | ICD-10-CM | POA: Diagnosis not present

## 2015-07-31 DIAGNOSIS — G64 Other disorders of peripheral nervous system: Secondary | ICD-10-CM | POA: Diagnosis not present

## 2015-08-08 DIAGNOSIS — Z992 Dependence on renal dialysis: Secondary | ICD-10-CM | POA: Diagnosis not present

## 2015-08-08 DIAGNOSIS — N186 End stage renal disease: Secondary | ICD-10-CM | POA: Diagnosis not present

## 2015-08-08 DIAGNOSIS — I10 Essential (primary) hypertension: Secondary | ICD-10-CM | POA: Diagnosis not present

## 2015-08-10 DIAGNOSIS — D631 Anemia in chronic kidney disease: Secondary | ICD-10-CM | POA: Diagnosis not present

## 2015-08-10 DIAGNOSIS — N2581 Secondary hyperparathyroidism of renal origin: Secondary | ICD-10-CM | POA: Diagnosis not present

## 2015-08-10 DIAGNOSIS — N186 End stage renal disease: Secondary | ICD-10-CM | POA: Diagnosis not present

## 2015-08-13 DIAGNOSIS — N186 End stage renal disease: Secondary | ICD-10-CM | POA: Diagnosis not present

## 2015-08-13 DIAGNOSIS — D631 Anemia in chronic kidney disease: Secondary | ICD-10-CM | POA: Diagnosis not present

## 2015-08-13 DIAGNOSIS — N2581 Secondary hyperparathyroidism of renal origin: Secondary | ICD-10-CM | POA: Diagnosis not present

## 2015-08-15 DIAGNOSIS — D631 Anemia in chronic kidney disease: Secondary | ICD-10-CM | POA: Diagnosis not present

## 2015-08-15 DIAGNOSIS — N2581 Secondary hyperparathyroidism of renal origin: Secondary | ICD-10-CM | POA: Diagnosis not present

## 2015-08-15 DIAGNOSIS — N186 End stage renal disease: Secondary | ICD-10-CM | POA: Diagnosis not present

## 2015-08-17 DIAGNOSIS — D631 Anemia in chronic kidney disease: Secondary | ICD-10-CM | POA: Diagnosis not present

## 2015-08-17 DIAGNOSIS — N186 End stage renal disease: Secondary | ICD-10-CM | POA: Diagnosis not present

## 2015-08-17 DIAGNOSIS — N2581 Secondary hyperparathyroidism of renal origin: Secondary | ICD-10-CM | POA: Diagnosis not present

## 2015-08-20 DIAGNOSIS — D631 Anemia in chronic kidney disease: Secondary | ICD-10-CM | POA: Diagnosis not present

## 2015-08-20 DIAGNOSIS — N2581 Secondary hyperparathyroidism of renal origin: Secondary | ICD-10-CM | POA: Diagnosis not present

## 2015-08-20 DIAGNOSIS — N186 End stage renal disease: Secondary | ICD-10-CM | POA: Diagnosis not present

## 2015-08-22 DIAGNOSIS — N2581 Secondary hyperparathyroidism of renal origin: Secondary | ICD-10-CM | POA: Diagnosis not present

## 2015-08-22 DIAGNOSIS — N186 End stage renal disease: Secondary | ICD-10-CM | POA: Diagnosis not present

## 2015-08-22 DIAGNOSIS — D631 Anemia in chronic kidney disease: Secondary | ICD-10-CM | POA: Diagnosis not present

## 2015-08-24 DIAGNOSIS — N186 End stage renal disease: Secondary | ICD-10-CM | POA: Diagnosis not present

## 2015-08-24 DIAGNOSIS — N2581 Secondary hyperparathyroidism of renal origin: Secondary | ICD-10-CM | POA: Diagnosis not present

## 2015-08-24 DIAGNOSIS — D631 Anemia in chronic kidney disease: Secondary | ICD-10-CM | POA: Diagnosis not present

## 2015-08-27 DIAGNOSIS — D631 Anemia in chronic kidney disease: Secondary | ICD-10-CM | POA: Diagnosis not present

## 2015-08-27 DIAGNOSIS — N2581 Secondary hyperparathyroidism of renal origin: Secondary | ICD-10-CM | POA: Diagnosis not present

## 2015-08-27 DIAGNOSIS — N186 End stage renal disease: Secondary | ICD-10-CM | POA: Diagnosis not present

## 2015-08-29 ENCOUNTER — Encounter: Payer: Self-pay | Admitting: Neurology

## 2015-08-29 ENCOUNTER — Ambulatory Visit (INDEPENDENT_AMBULATORY_CARE_PROVIDER_SITE_OTHER): Payer: Self-pay | Admitting: Neurology

## 2015-08-29 ENCOUNTER — Ambulatory Visit (INDEPENDENT_AMBULATORY_CARE_PROVIDER_SITE_OTHER): Payer: Medicare Other | Admitting: Neurology

## 2015-08-29 DIAGNOSIS — G609 Hereditary and idiopathic neuropathy, unspecified: Secondary | ICD-10-CM | POA: Insufficient documentation

## 2015-08-29 DIAGNOSIS — N186 End stage renal disease: Secondary | ICD-10-CM | POA: Diagnosis not present

## 2015-08-29 DIAGNOSIS — D631 Anemia in chronic kidney disease: Secondary | ICD-10-CM | POA: Diagnosis not present

## 2015-08-29 DIAGNOSIS — N2581 Secondary hyperparathyroidism of renal origin: Secondary | ICD-10-CM | POA: Diagnosis not present

## 2015-08-29 HISTORY — DX: Hereditary and idiopathic neuropathy, unspecified: G60.9

## 2015-08-29 NOTE — Procedures (Signed)
     HISTORY:  Trevor [name] is a 64 year old gentleman with a one-year history of numbness in the legs. The patient indicates that there is a cold sensation in the feet, no overt pain. He denies any back pain or pain down the legs. He is being evaluated for possible neuropathy.  NERVE CONDUCTION STUDIES:  Nerve conduction studies were performed on both lower extremities. No response was seen for the peroneal or posterior tibial nerves bilaterally. The peroneal sensory latencies were unobtainable bilaterally, the H reflex latencies were unobtainable bilaterally.   The patient refused nerve conduction study of the upper extremities.  EMG STUDIES:  A limited EMG study was performed on the right lower extremity:  The tibialis anterior muscle reveals 2 to 4K motor units with full recruitment. No fibrillations or positive waves were seen. The peroneus tertius muscle reveals 1 to 2K motor units with decreased recruitment. 2+ fibrillations and positive waves were seen.  The patient refused further EMG evaluation.   IMPRESSION:  Nerve conduction studies of both lower extremities shows no response, consistent with a significant primarily axonal peripheral neuropathy. EMG evaluation was limited, but showed some distal acute and chronic denervation consistent with the diagnosis of peripheral neuropathy. Unable to exclude the possibility of an overlying lumbosacral radiculopathy on this study.  Marlan Palau MD 08/29/2015 4:19 PM  [facility] [address] [place], [place] [address]  Phone [phone] Fax [phone]

## 2015-08-29 NOTE — Progress Notes (Signed)
Please refer to EMG and nerve conduction study procedure note. 

## 2015-08-31 DIAGNOSIS — N186 End stage renal disease: Secondary | ICD-10-CM | POA: Diagnosis not present

## 2015-08-31 DIAGNOSIS — D631 Anemia in chronic kidney disease: Secondary | ICD-10-CM | POA: Diagnosis not present

## 2015-08-31 DIAGNOSIS — N2581 Secondary hyperparathyroidism of renal origin: Secondary | ICD-10-CM | POA: Diagnosis not present

## 2015-09-03 DIAGNOSIS — D631 Anemia in chronic kidney disease: Secondary | ICD-10-CM | POA: Diagnosis not present

## 2015-09-03 DIAGNOSIS — N2581 Secondary hyperparathyroidism of renal origin: Secondary | ICD-10-CM | POA: Diagnosis not present

## 2015-09-03 DIAGNOSIS — N186 End stage renal disease: Secondary | ICD-10-CM | POA: Diagnosis not present

## 2015-09-05 DIAGNOSIS — D631 Anemia in chronic kidney disease: Secondary | ICD-10-CM | POA: Diagnosis not present

## 2015-09-05 DIAGNOSIS — N186 End stage renal disease: Secondary | ICD-10-CM | POA: Diagnosis not present

## 2015-09-05 DIAGNOSIS — N2581 Secondary hyperparathyroidism of renal origin: Secondary | ICD-10-CM | POA: Diagnosis not present

## 2015-09-07 DIAGNOSIS — D631 Anemia in chronic kidney disease: Secondary | ICD-10-CM | POA: Diagnosis not present

## 2015-09-07 DIAGNOSIS — N2581 Secondary hyperparathyroidism of renal origin: Secondary | ICD-10-CM | POA: Diagnosis not present

## 2015-09-07 DIAGNOSIS — N186 End stage renal disease: Secondary | ICD-10-CM | POA: Diagnosis not present

## 2015-09-08 DIAGNOSIS — N186 End stage renal disease: Secondary | ICD-10-CM | POA: Diagnosis not present

## 2015-09-08 DIAGNOSIS — Z992 Dependence on renal dialysis: Secondary | ICD-10-CM | POA: Diagnosis not present

## 2015-09-08 DIAGNOSIS — I10 Essential (primary) hypertension: Secondary | ICD-10-CM | POA: Diagnosis not present

## 2015-09-10 DIAGNOSIS — N2581 Secondary hyperparathyroidism of renal origin: Secondary | ICD-10-CM | POA: Diagnosis not present

## 2015-09-10 DIAGNOSIS — D631 Anemia in chronic kidney disease: Secondary | ICD-10-CM | POA: Diagnosis not present

## 2015-09-10 DIAGNOSIS — N186 End stage renal disease: Secondary | ICD-10-CM | POA: Diagnosis not present

## 2015-09-12 DIAGNOSIS — N186 End stage renal disease: Secondary | ICD-10-CM | POA: Diagnosis not present

## 2015-09-12 DIAGNOSIS — N2581 Secondary hyperparathyroidism of renal origin: Secondary | ICD-10-CM | POA: Diagnosis not present

## 2015-09-12 DIAGNOSIS — D631 Anemia in chronic kidney disease: Secondary | ICD-10-CM | POA: Diagnosis not present

## 2015-09-14 DIAGNOSIS — N2581 Secondary hyperparathyroidism of renal origin: Secondary | ICD-10-CM | POA: Diagnosis not present

## 2015-09-14 DIAGNOSIS — N186 End stage renal disease: Secondary | ICD-10-CM | POA: Diagnosis not present

## 2015-09-14 DIAGNOSIS — D631 Anemia in chronic kidney disease: Secondary | ICD-10-CM | POA: Diagnosis not present

## 2015-09-17 DIAGNOSIS — N186 End stage renal disease: Secondary | ICD-10-CM | POA: Diagnosis not present

## 2015-09-17 DIAGNOSIS — D631 Anemia in chronic kidney disease: Secondary | ICD-10-CM | POA: Diagnosis not present

## 2015-09-17 DIAGNOSIS — N2581 Secondary hyperparathyroidism of renal origin: Secondary | ICD-10-CM | POA: Diagnosis not present

## 2015-09-19 DIAGNOSIS — N186 End stage renal disease: Secondary | ICD-10-CM | POA: Diagnosis not present

## 2015-09-19 DIAGNOSIS — N2581 Secondary hyperparathyroidism of renal origin: Secondary | ICD-10-CM | POA: Diagnosis not present

## 2015-09-19 DIAGNOSIS — D631 Anemia in chronic kidney disease: Secondary | ICD-10-CM | POA: Diagnosis not present

## 2015-09-20 ENCOUNTER — Encounter: Payer: Medicare Other | Admitting: Neurology

## 2015-09-21 DIAGNOSIS — N2581 Secondary hyperparathyroidism of renal origin: Secondary | ICD-10-CM | POA: Diagnosis not present

## 2015-09-21 DIAGNOSIS — N186 End stage renal disease: Secondary | ICD-10-CM | POA: Diagnosis not present

## 2015-09-21 DIAGNOSIS — D631 Anemia in chronic kidney disease: Secondary | ICD-10-CM | POA: Diagnosis not present

## 2015-09-24 DIAGNOSIS — D631 Anemia in chronic kidney disease: Secondary | ICD-10-CM | POA: Diagnosis not present

## 2015-09-24 DIAGNOSIS — N186 End stage renal disease: Secondary | ICD-10-CM | POA: Diagnosis not present

## 2015-09-24 DIAGNOSIS — N2581 Secondary hyperparathyroidism of renal origin: Secondary | ICD-10-CM | POA: Diagnosis not present

## 2015-09-26 DIAGNOSIS — N2581 Secondary hyperparathyroidism of renal origin: Secondary | ICD-10-CM | POA: Diagnosis not present

## 2015-09-26 DIAGNOSIS — D631 Anemia in chronic kidney disease: Secondary | ICD-10-CM | POA: Diagnosis not present

## 2015-09-26 DIAGNOSIS — N186 End stage renal disease: Secondary | ICD-10-CM | POA: Diagnosis not present

## 2015-09-27 DIAGNOSIS — G64 Other disorders of peripheral nervous system: Secondary | ICD-10-CM | POA: Diagnosis not present

## 2015-09-28 DIAGNOSIS — N2581 Secondary hyperparathyroidism of renal origin: Secondary | ICD-10-CM | POA: Diagnosis not present

## 2015-09-28 DIAGNOSIS — N186 End stage renal disease: Secondary | ICD-10-CM | POA: Diagnosis not present

## 2015-09-28 DIAGNOSIS — D631 Anemia in chronic kidney disease: Secondary | ICD-10-CM | POA: Diagnosis not present

## 2015-10-01 DIAGNOSIS — N186 End stage renal disease: Secondary | ICD-10-CM | POA: Diagnosis not present

## 2015-10-01 DIAGNOSIS — N2581 Secondary hyperparathyroidism of renal origin: Secondary | ICD-10-CM | POA: Diagnosis not present

## 2015-10-01 DIAGNOSIS — D631 Anemia in chronic kidney disease: Secondary | ICD-10-CM | POA: Diagnosis not present

## 2015-10-03 DIAGNOSIS — D631 Anemia in chronic kidney disease: Secondary | ICD-10-CM | POA: Diagnosis not present

## 2015-10-03 DIAGNOSIS — N186 End stage renal disease: Secondary | ICD-10-CM | POA: Diagnosis not present

## 2015-10-03 DIAGNOSIS — N2581 Secondary hyperparathyroidism of renal origin: Secondary | ICD-10-CM | POA: Diagnosis not present

## 2015-10-05 DIAGNOSIS — N186 End stage renal disease: Secondary | ICD-10-CM | POA: Diagnosis not present

## 2015-10-05 DIAGNOSIS — N2581 Secondary hyperparathyroidism of renal origin: Secondary | ICD-10-CM | POA: Diagnosis not present

## 2015-10-05 DIAGNOSIS — D631 Anemia in chronic kidney disease: Secondary | ICD-10-CM | POA: Diagnosis not present

## 2015-10-08 DIAGNOSIS — N186 End stage renal disease: Secondary | ICD-10-CM | POA: Diagnosis not present

## 2015-10-08 DIAGNOSIS — N2581 Secondary hyperparathyroidism of renal origin: Secondary | ICD-10-CM | POA: Diagnosis not present

## 2015-10-08 DIAGNOSIS — D631 Anemia in chronic kidney disease: Secondary | ICD-10-CM | POA: Diagnosis not present

## 2015-10-09 DIAGNOSIS — N186 End stage renal disease: Secondary | ICD-10-CM | POA: Diagnosis not present

## 2015-10-09 DIAGNOSIS — Z992 Dependence on renal dialysis: Secondary | ICD-10-CM | POA: Diagnosis not present

## 2015-10-09 DIAGNOSIS — I10 Essential (primary) hypertension: Secondary | ICD-10-CM | POA: Diagnosis not present

## 2015-10-10 DIAGNOSIS — D631 Anemia in chronic kidney disease: Secondary | ICD-10-CM | POA: Diagnosis not present

## 2015-10-10 DIAGNOSIS — N2581 Secondary hyperparathyroidism of renal origin: Secondary | ICD-10-CM | POA: Diagnosis not present

## 2015-10-10 DIAGNOSIS — N186 End stage renal disease: Secondary | ICD-10-CM | POA: Diagnosis not present

## 2015-10-12 DIAGNOSIS — N2581 Secondary hyperparathyroidism of renal origin: Secondary | ICD-10-CM | POA: Diagnosis not present

## 2015-10-12 DIAGNOSIS — N186 End stage renal disease: Secondary | ICD-10-CM | POA: Diagnosis not present

## 2015-10-12 DIAGNOSIS — D631 Anemia in chronic kidney disease: Secondary | ICD-10-CM | POA: Diagnosis not present

## 2015-10-14 ENCOUNTER — Inpatient Hospital Stay (HOSPITAL_COMMUNITY)
Admission: EM | Admit: 2015-10-14 | Discharge: 2015-10-19 | DRG: 391 | Disposition: A | Discharge status: Home / Self Care | Payer: Medicare Other | Attending: Internal Medicine | Admitting: Internal Medicine

## 2015-10-14 ENCOUNTER — Encounter (HOSPITAL_COMMUNITY): Payer: Self-pay | Admitting: Emergency Medicine

## 2015-10-14 ENCOUNTER — Emergency Department (HOSPITAL_COMMUNITY): Payer: Medicare Other

## 2015-10-14 DIAGNOSIS — G8929 Other chronic pain: Secondary | ICD-10-CM | POA: Diagnosis present

## 2015-10-14 DIAGNOSIS — R1013 Epigastric pain: Secondary | ICD-10-CM | POA: Diagnosis not present

## 2015-10-14 DIAGNOSIS — Z992 Dependence on renal dialysis: Secondary | ICD-10-CM

## 2015-10-14 DIAGNOSIS — R109 Unspecified abdominal pain: Secondary | ICD-10-CM | POA: Diagnosis present

## 2015-10-14 DIAGNOSIS — N2581 Secondary hyperparathyroidism of renal origin: Secondary | ICD-10-CM | POA: Diagnosis present

## 2015-10-14 DIAGNOSIS — Z86711 Personal history of pulmonary embolism: Secondary | ICD-10-CM

## 2015-10-14 DIAGNOSIS — I739 Peripheral vascular disease, unspecified: Secondary | ICD-10-CM | POA: Diagnosis present

## 2015-10-14 DIAGNOSIS — R51 Headache: Secondary | ICD-10-CM | POA: Diagnosis not present

## 2015-10-14 DIAGNOSIS — I2510 Atherosclerotic heart disease of native coronary artery without angina pectoris: Secondary | ICD-10-CM | POA: Diagnosis present

## 2015-10-14 DIAGNOSIS — I252 Old myocardial infarction: Secondary | ICD-10-CM

## 2015-10-14 DIAGNOSIS — N200 Calculus of kidney: Secondary | ICD-10-CM | POA: Diagnosis present

## 2015-10-14 DIAGNOSIS — I959 Hypotension, unspecified: Secondary | ICD-10-CM | POA: Diagnosis not present

## 2015-10-14 DIAGNOSIS — R188 Other ascites: Secondary | ICD-10-CM | POA: Diagnosis not present

## 2015-10-14 DIAGNOSIS — Z87891 Personal history of nicotine dependence: Secondary | ICD-10-CM | POA: Diagnosis not present

## 2015-10-14 DIAGNOSIS — K219 Gastro-esophageal reflux disease without esophagitis: Secondary | ICD-10-CM | POA: Diagnosis present

## 2015-10-14 DIAGNOSIS — E8889 Other specified metabolic disorders: Secondary | ICD-10-CM | POA: Diagnosis present

## 2015-10-14 DIAGNOSIS — B182 Chronic viral hepatitis C: Secondary | ICD-10-CM | POA: Diagnosis present

## 2015-10-14 DIAGNOSIS — I482 Chronic atrial fibrillation, unspecified: Secondary | ICD-10-CM | POA: Diagnosis present

## 2015-10-14 DIAGNOSIS — I10 Essential (primary) hypertension: Secondary | ICD-10-CM | POA: Diagnosis present

## 2015-10-14 DIAGNOSIS — D509 Iron deficiency anemia, unspecified: Secondary | ICD-10-CM | POA: Diagnosis present

## 2015-10-14 DIAGNOSIS — I509 Heart failure, unspecified: Secondary | ICD-10-CM | POA: Diagnosis present

## 2015-10-14 DIAGNOSIS — D631 Anemia in chronic kidney disease: Secondary | ICD-10-CM | POA: Diagnosis present

## 2015-10-14 DIAGNOSIS — I5023 Acute on chronic systolic (congestive) heart failure: Secondary | ICD-10-CM | POA: Diagnosis not present

## 2015-10-14 DIAGNOSIS — R140 Abdominal distension (gaseous): Secondary | ICD-10-CM | POA: Diagnosis present

## 2015-10-14 DIAGNOSIS — I120 Hypertensive chronic kidney disease with stage 5 chronic kidney disease or end stage renal disease: Secondary | ICD-10-CM | POA: Diagnosis not present

## 2015-10-14 DIAGNOSIS — D696 Thrombocytopenia, unspecified: Secondary | ICD-10-CM | POA: Diagnosis present

## 2015-10-14 DIAGNOSIS — R162 Hepatomegaly with splenomegaly, not elsewhere classified: Secondary | ICD-10-CM | POA: Diagnosis present

## 2015-10-14 DIAGNOSIS — D61818 Other pancytopenia: Secondary | ICD-10-CM | POA: Diagnosis present

## 2015-10-14 DIAGNOSIS — N189 Chronic kidney disease, unspecified: Secondary | ICD-10-CM | POA: Diagnosis not present

## 2015-10-14 DIAGNOSIS — K7460 Unspecified cirrhosis of liver: Secondary | ICD-10-CM | POA: Diagnosis present

## 2015-10-14 DIAGNOSIS — Z66 Do not resuscitate: Secondary | ICD-10-CM | POA: Diagnosis present

## 2015-10-14 DIAGNOSIS — I420 Dilated cardiomyopathy: Secondary | ICD-10-CM | POA: Diagnosis present

## 2015-10-14 DIAGNOSIS — I4510 Unspecified right bundle-branch block: Secondary | ICD-10-CM | POA: Diagnosis present

## 2015-10-14 DIAGNOSIS — K761 Chronic passive congestion of liver: Secondary | ICD-10-CM | POA: Diagnosis present

## 2015-10-14 DIAGNOSIS — B1920 Unspecified viral hepatitis C without hepatic coma: Secondary | ICD-10-CM | POA: Diagnosis present

## 2015-10-14 DIAGNOSIS — Z91018 Allergy to other foods: Secondary | ICD-10-CM

## 2015-10-14 DIAGNOSIS — N186 End stage renal disease: Secondary | ICD-10-CM | POA: Diagnosis not present

## 2015-10-14 DIAGNOSIS — I50810 Right heart failure, unspecified: Secondary | ICD-10-CM

## 2015-10-14 DIAGNOSIS — G4730 Sleep apnea, unspecified: Secondary | ICD-10-CM | POA: Diagnosis present

## 2015-10-14 DIAGNOSIS — E559 Vitamin D deficiency, unspecified: Secondary | ICD-10-CM | POA: Diagnosis present

## 2015-10-14 DIAGNOSIS — M7981 Nontraumatic hematoma of soft tissue: Secondary | ICD-10-CM | POA: Diagnosis present

## 2015-10-14 DIAGNOSIS — Z7901 Long term (current) use of anticoagulants: Secondary | ICD-10-CM

## 2015-10-14 DIAGNOSIS — E871 Hypo-osmolality and hyponatremia: Secondary | ICD-10-CM | POA: Diagnosis not present

## 2015-10-14 DIAGNOSIS — G609 Hereditary and idiopathic neuropathy, unspecified: Secondary | ICD-10-CM | POA: Diagnosis present

## 2015-10-14 DIAGNOSIS — I129 Hypertensive chronic kidney disease with stage 1 through stage 4 chronic kidney disease, or unspecified chronic kidney disease: Secondary | ICD-10-CM | POA: Diagnosis not present

## 2015-10-14 DIAGNOSIS — I132 Hypertensive heart and chronic kidney disease with heart failure and with stage 5 chronic kidney disease, or end stage renal disease: Secondary | ICD-10-CM | POA: Diagnosis present

## 2015-10-14 HISTORY — DX: Hepatomegaly with splenomegaly, not elsewhere classified: R16.2

## 2015-10-14 LAB — CBC WITH DIFFERENTIAL/PLATELET
Basophils Absolute: 0 10*3/uL (ref 0.0–0.1)
Basophils Relative: 0 %
Eosinophils Absolute: 0.1 10*3/uL (ref 0.0–0.7)
Eosinophils Relative: 3 %
HCT: 34.6 % — ABNORMAL LOW (ref 39.0–52.0)
Hemoglobin: 11.6 g/dL — ABNORMAL LOW (ref 13.0–17.0)
LYMPHS PCT: 28 %
Lymphs Abs: 0.7 10*3/uL (ref 0.7–4.0)
MCH: 29.4 pg (ref 26.0–34.0)
MCHC: 33.5 g/dL (ref 30.0–36.0)
MCV: 87.6 fL (ref 78.0–100.0)
MONO ABS: 0.4 10*3/uL (ref 0.1–1.0)
Monocytes Relative: 18 %
NEUTROS ABS: 1.2 10*3/uL — AB (ref 1.7–7.7)
NEUTROS PCT: 51 %
Platelets: 83 10*3/uL — ABNORMAL LOW (ref 150–400)
RBC: 3.95 MIL/uL — ABNORMAL LOW (ref 4.22–5.81)
RDW: 17.5 % — AB (ref 11.5–15.5)
WBC: 2.4 10*3/uL — ABNORMAL LOW (ref 4.0–10.5)

## 2015-10-14 LAB — COMPREHENSIVE METABOLIC PANEL
ALT: 16 U/L — ABNORMAL LOW (ref 17–63)
ANION GAP: 11 (ref 5–15)
AST: 34 U/L (ref 15–41)
Albumin: 3.5 g/dL (ref 3.5–5.0)
Alkaline Phosphatase: 174 U/L — ABNORMAL HIGH (ref 38–126)
BILIRUBIN TOTAL: 1.5 mg/dL — AB (ref 0.3–1.2)
BUN: 35 mg/dL — ABNORMAL HIGH (ref 6–20)
CO2: 29 mmol/L (ref 22–32)
Calcium: 9.7 mg/dL (ref 8.9–10.3)
Chloride: 96 mmol/L — ABNORMAL LOW (ref 101–111)
Creatinine, Ser: 7.34 mg/dL — ABNORMAL HIGH (ref 0.61–1.24)
GFR calc non Af Amer: 7 mL/min — ABNORMAL LOW (ref >60)
GFR, EST AFRICAN AMERICAN: 8 mL/min — AB (ref >60)
GLUCOSE: 88 mg/dL (ref 65–99)
POTASSIUM: 5 mmol/L (ref 3.5–5.1)
SODIUM: 136 mmol/L (ref 135–145)
TOTAL PROTEIN: 8.4 g/dL — AB (ref 6.5–8.1)

## 2015-10-14 LAB — LIPASE, BLOOD: Lipase: 71 U/L — ABNORMAL HIGH (ref 11–51)

## 2015-10-14 MED ORDER — DOXERCALCIFEROL 4 MCG/2ML IV SOLN
5.0000 ug | INTRAVENOUS | Status: DC
  Administered 2015-10-15 – 2015-10-19 (×3): 5 ug via INTRAVENOUS
  Filled 2015-10-14 (×3): qty 4

## 2015-10-14 MED ORDER — ONDANSETRON HCL 4 MG/2ML IJ SOLN: 4.0000 mg | Freq: Four times a day (QID) | INTRAMUSCULAR | Status: DC | PRN

## 2015-10-14 MED ORDER — GI COCKTAIL ~~LOC~~
30.0000 mL | Freq: Once | ORAL | Status: AC
  Administered 2015-10-14: 30 mL via ORAL
  Filled 2015-10-14: qty 30

## 2015-10-14 MED ORDER — IOHEXOL 300 MG/ML  SOLN
100.0000 mL | Freq: Once | INTRAMUSCULAR | Status: AC | PRN
Start: 2015-10-14 — End: 2015-10-14
  Administered 2015-10-14: 75 mL via INTRAVENOUS

## 2015-10-14 MED ORDER — LABETALOL HCL 200 MG PO TABS
200.0000 mg | ORAL_TABLET | Freq: Once | ORAL | Status: AC
  Administered 2015-10-14: 200 mg via ORAL
  Filled 2015-10-14: qty 1

## 2015-10-14 MED ORDER — POLYETHYLENE GLYCOL 3350 17 G PO PACK: 17.0000 g | PACK | Freq: Every day | ORAL | Status: DC | PRN

## 2015-10-14 MED ORDER — MORPHINE SULFATE (PF) 4 MG/ML IV SOLN
4.0000 mg | Freq: Once | INTRAVENOUS | Status: AC
  Administered 2015-10-14: 4 mg via INTRAVENOUS
  Filled 2015-10-14: qty 1

## 2015-10-14 MED ORDER — SODIUM CHLORIDE 0.9 % IV SOLN: 250.0000 mL | INTRAVENOUS | Status: DC | PRN

## 2015-10-14 MED ORDER — SODIUM CHLORIDE 0.9% FLUSH
3.0000 mL | Freq: Two times a day (BID) | INTRAVENOUS | Status: DC
  Administered 2015-10-14 – 2015-10-18 (×8): 3 mL via INTRAVENOUS

## 2015-10-14 MED ORDER — MORPHINE SULFATE (PF) 2 MG/ML IV SOLN
1.0000 mg | INTRAVENOUS | Status: DC | PRN
  Administered 2015-10-14 – 2015-10-19 (×22): 1 mg via INTRAVENOUS
  Filled 2015-10-14 (×22): qty 1

## 2015-10-14 MED ORDER — SORBITOL 70 % SOLN
30.0000 mL | Freq: Every day | Status: DC | PRN
  Administered 2015-10-16 – 2015-10-17 (×3): 30 mL via ORAL
  Filled 2015-10-14 (×3): qty 30

## 2015-10-14 MED ORDER — SODIUM CHLORIDE 0.9% FLUSH
3.0000 mL | INTRAVENOUS | Status: DC | PRN
  Administered 2015-10-18: 3 mL via INTRAVENOUS
  Filled 2015-10-14: qty 3

## 2015-10-14 MED ORDER — LABETALOL HCL 200 MG PO TABS
200.0000 mg | ORAL_TABLET | Freq: Every day | ORAL | Status: DC
  Administered 2015-10-16 – 2015-10-19 (×4): 200 mg via ORAL
  Filled 2015-10-14 (×4): qty 1

## 2015-10-14 MED ORDER — ONDANSETRON HCL 4 MG/2ML IJ SOLN
4.0000 mg | Freq: Once | INTRAMUSCULAR | Status: AC
  Administered 2015-10-14: 4 mg via INTRAVENOUS
  Filled 2015-10-14: qty 2

## 2015-10-14 MED ORDER — ONDANSETRON HCL 4 MG PO TABS: 4.0000 mg | ORAL_TABLET | Freq: Four times a day (QID) | ORAL | Status: DC | PRN

## 2015-10-14 MED ORDER — TRAZODONE HCL 50 MG PO TABS
25.0000 mg | ORAL_TABLET | Freq: Every evening | ORAL | Status: DC | PRN
  Filled 2015-10-14: qty 1

## 2015-10-14 MED ORDER — SEVELAMER CARBONATE 800 MG PO TABS
2400.0000 mg | ORAL_TABLET | Freq: Three times a day (TID) | ORAL | Status: DC
  Administered 2015-10-14 – 2015-10-19 (×13): 2400 mg via ORAL
  Filled 2015-10-14 (×12): qty 3

## 2015-10-14 MED ORDER — PANTOPRAZOLE SODIUM 40 MG IV SOLR
40.0000 mg | Freq: Two times a day (BID) | INTRAVENOUS | Status: DC
  Administered 2015-10-14 (×2): 40 mg via INTRAVENOUS
  Filled 2015-10-14 (×2): qty 40

## 2015-10-14 NOTE — H&P (Signed)
[facility] History and Physical  Trevor [name] ZOX:[id number] DOB: 1951-05-19 DOA: 10/14/2015  Referring physician: Bebe Shaggy PCP: [name], MD   Chief Complaint: intractable abdominal pain/bloating  HPI: Trevor [name] is a very pleasant 65 y.o. male with a past medical history end-stage renal disease on a Monday Wednesday Friday dialysis schedule, biventricular heart failure, A. 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 Spent more than 4 hours in the emergency department unable to control pain  Permission is obtained from the patient. He reports hernia repair about a year ago and states he's had intermittent abdominal bloating ever since that time. Yesterday after dialysis he developed worsening constant persistent abdominal pain and bloating. He describes the pain as sharp and "knotty". Gated all over his abdomen but pronounced in his epigastric area. Associated symptoms include cough. He reports pain is worse after eating with lying down. He reports the bloating makes it difficult for him to stand up straight. He denies chest pain palpitations fever chills nausea vomiting or diarrhea. He reports his last bowel movement was yesterday normal color and consistency.  In the emergency department he's afebrile hypertensive with a blood pressure 134/106 is not hypoxic. He received 2 doses of morphine one dose of Zofran with no relief   Review of Systems:  And point review of systems complete and all systems are negative except as indicated in the history of present illness   Past Medical History  Diagnosis Date  . Anemia   . AVF (arteriovenous fistula) (HCC)     Left  . Secondary hyperparathyroidism (HCC)   . Hypovitaminosis D   . Hypertensive urgency     H/o  . CHF (congestive heart failure) (HCC)   . Exertional shortness of breath     "related to infection  in my lungs right now" (05/03/2013)  . History of gout     "before I started doing the dialysis" (05/03/2013)  . Sleep apnea   . GERD (gastroesophageal reflux disease)   . Syncope     felt secondary to residual anesthesia the day before - 2D echo unremarkable  . Pulmonary embolism (HCC)     with right DVT secondary to recent surgery  . Atrial fibrillation (HCC)     not on coumadin due to large spontaneous hematoma on back and anemia  . Myocardial infarction (HCC) 90's  . Coronary artery disease   . Dysrhythmia     afib  . Peripheral vascular disease (HCC)     dvt leg 12/14  . Hypertension   . ESRD (end stage renal disease) on dialysis (HCC)     [facility] mon/wed/fri  . Atrial flutter (HCC) 04/27/2013  . S/P repair of ventral hernia 03/21/2014  . Cirrhosis of liver (HCC) 08-2014  . Hereditary and idiopathic peripheral neuropathy 08/29/2015  . Hepatitis C    Past Surgical History  Procedure Laterality Date  . Av fistula placement Left     Dr. Charlean Sanfilippo; "I've had 2 on the left' (05/03/2013)  . Av fistula placement Right ~ 2011  . Knee arthroscopy Left   . Avgg removal Right 05/04/2013    Procedure: REMOVAL OF ARTERIOVENOUS Fistula Right Arm;  Surgeon: [name], MD;  Location: [facility] OR;  Service: Vascular;  Laterality: Right;  . Insertion of dialysis catheter Right 05/04/2013    Procedure: INSERTION OF DIALYSIS CATHETER;  Surgeon: [name], MD;  Location: [facility] OR;  Service:  Vascular;  Laterality: Right;  . Colonoscopy      Hx: of  . Bascilic vein transposition Left 06/27/2013    Procedure: BASCILIC VEIN TRANSPOSITION;  Surgeon: [name], MD;  Location: [facility] OR;  Service: Vascular;  Laterality: Left;  . Cardioversion N/A 08/29/2013    Procedure: CARDIOVERSION;  Surgeon: [name], MD;  Location: [facility] ENDOSCOPY;  Service: Cardiovascular;  Laterality: N/A;  . Removal of a dialysis catheter  2/15  . Hernia repair  03/21/14    Umbilical hernia-Dr. Derrell Lolling  . Umbilical  hernia repair N/A 03/21/2014    Procedure: LAPAROSCOPIC UMBILICAL HERNIA REPAIR WITH MESH;  Surgeon: Axel Filler, MD;  Location: [facility] OR;  Service: General;  Laterality: N/A;  . Insertion of mesh N/A 03/21/2014    Procedure: INSERTION OF MESH;  Surgeon: Axel Filler, MD;  Location: [facility] OR;  Service: General;  Laterality: N/A;  . Venogram Left 05/26/2013    Procedure: VENOGRAM;  Surgeon: Fransisco Hertz, MD;  Location: [facility] CATH LAB;  Service: Cardiovascular;  Laterality: Left;   Social History:  reports that he quit smoking about 43 years ago. His smoking use included Cigarettes. He has a .125 pack-year smoking history. He has never used smokeless tobacco. He reports that he does not drink alcohol or use illicit drugs. He lives at home with his wife and daughter. 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 Allergies  Allergen Reactions  . Pork-Derived Products Nausea And Vomiting    Culture    Family History  Problem Relation Age of Onset  . Hypertension Father   . Kidney disease Father   . Allergies Father   . Deep vein thrombosis Sister   . Pulmonary embolism Sister   . Diabetes Paternal Grandmother      Prior to Admission medications   Medication Sig Start Date End Date Taking? Authorizing Provider  B Complex-C (B-COMPLEX WITH VITAMIN C) tablet Take 1 tablet by mouth daily.   Yes Historical Provider, MD  labetalol (NORMODYNE) 200 MG tablet Take 200 mg by mouth daily.    Yes Historical Provider, MD  sevelamer carbonate (RENVELA) 800 MG tablet Take 4 tablets (3,200 mg total) by mouth 3 (three) times daily with meals. Patient taking differently: Take 2,400 mg by mouth 3 (three) times daily with meals.  09/12/14  Yes Stephani Police, PA-C   Physical Exam: Filed Vitals:   10/14/15 0630 10/14/15 0733 10/14/15 0800 10/14/15 0808  BP:  134/106 134/93 134/93  Pulse: 96 93 95 94  Temp:      TempSrc:      Resp:  20    SpO2: 95% 93% 94%     Wt Readings from Last 3  Encounters:  04/19/15 91.4 kg (201 lb 8 oz)  10/05/14 87.998 kg (194 lb)  09/12/14 83.9 kg (184 lb 15.5 oz)    General:  Appears calm and only slightly uncomfortable Eyes: PERRL, normal lids, irises & conjunctiva ENT: grossly normal hearing, lips & tongue, his membranes of his mouth are moist and pink Neck: no LAD, masses or thyromegaly Cardiovascular: Irregularly irregular no m/r/g. Lower extremities not edematous but tight.  Respiratory:  Normal respiratory effort. Very fine faint crackles in right base Abdomen: soft, positive bowel sounds mild diffuse tenderness worse in the upper epigastric area no rebound no guarding Skin: no rash or induration seen on limited exam Musculoskeletal: grossly normal tone BUE/BLE Psychiatric: grossly normal mood and affect, speech fluent and appropriate Neurologic: grossly non-focal.  Speech clear facial symmetry oriented  3           Labs on Admission:  Basic Metabolic Panel:  Recent Labs Lab 10/14/15 0302  NA 136  K 5.0  CL 96*  CO2 29  GLUCOSE 88  BUN 35*  CREATININE 7.34*  CALCIUM 9.7   Liver Function Tests:  Recent Labs Lab 10/14/15 0302  AST 34  ALT 16*  ALKPHOS 174*  BILITOT 1.5*  PROT 8.4*  ALBUMIN 3.5    Recent Labs Lab 10/14/15 0302  LIPASE 71*   No results for input(s): AMMONIA in the last 168 hours. CBC:  Recent Labs Lab 10/14/15 0302  WBC 2.4*  NEUTROABS 1.2*  HGB 11.6*  HCT 34.6*  MCV 87.6  PLT 83*   Cardiac Enzymes: No results for input(s): CKTOTAL, CKMB, CKMBINDEX, TROPONINI in the last 168 hours.  BNP (last 3 results) No results for input(s): BNP in the last 8760 hours.  ProBNP (last 3 results) No results for input(s): PROBNP in the last 8760 hours.  CBG: No results for input(s): GLUCAP in the last 168 hours.  Radiological Exams on Admission: Ct Abdomen Pelvis W Contrast  10/14/2015  CLINICAL DATA:  Abdominal pain beginning in dialysis yesterday EXAM: CT ABDOMEN AND PELVIS WITH CONTRAST  TECHNIQUE: Multidetector CT imaging of the abdomen and pelvis was performed using the standard protocol following bolus administration of intravenous contrast. CONTRAST:  75mL OMNIPAQUE IOHEXOL 300 MG/ML  SOLN COMPARISON:  08/30/2014 FINDINGS: Lower chest and abdominal wall: Chronic cardiomegaly with marked atrial enlargement. There is chronic calcified hilar and mediastinal adenopathy compatible with granulomatous disease. Mild reticulation in the lower lungs which is chronic or atelectatic. No pneumonia or edema suspected. Hepatobiliary: Large appearance of the liver without acute finding. Stable 14 mm subcapsular nodule in segment 6. Hazy high-density layering in the gallbladder, likely sludge. No calcified gallstone. Pericholecystic edema is mild and in keeping with diffuse retroperitoneal edema a considered reactive (could be secondary to patient's volume overload, hepatitis-C, or right heart dysfunction). Pancreas: Unremarkable. Spleen: Chronic mild splenomegaly.  Splenic thickness is 65 mm. Adrenals/Urinary Tract: Negative adrenals. No hydronephrosis or stone. Smooth renal atrophy with simple appearing cysts. Right lower pole nephrolithiasis. No evidence of mass lesion. Decompressed bladder which is unremarkable. Reproductive:Dystrophic prostate calcifications Stomach/Bowel: No obstruction. No inflammatory changes when accounting for sub peritoneal edema from volume overload; no appendicitis. Vascular/Lymphatic: Diffuse atherosclerotic calcification. No finding. There is bulky adenopathy in the hepatic hilum with faint calcifications, stable since 2015. Portacaval node measures 33 mm in short axis. Peritoneal: Small ascites in the pelvis, likely from volume overload. Musculoskeletal: No acute abnormalities. 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 IMPRESSION: 1. No definitive explanation for abdominal pain. 2. Chronic granulomatous disease with adenopathy and splenomegaly. 3. Volume overload. Electronically  Signed   By: Marnee Spring M.D.   On: 10/14/2015 06:37    EKG: Independently reviewed. A. fib with right bundle branch block  Assessment/Plan Principal Problem:   Intractable abdominal pain Active Problems:   ANEMIA, IRON DEFICIENCY   Essential hypertension   ESRD on hemodialysis (HCC)   GERD (gastroesophageal reflux disease)   Thrombocytopenia (HCC)   Chronic atrial fibrillation (HCC)   RVF (right ventricular failure) (HCC)   Abdominal distension   Hepatic cirrhosis (HCC)  #1. Intractable abdominal pain/distention. History of same. CT definitive explanation for abdominal pain. Chronic granulomatosis disease with adenopathy and splenomegaly volume overload. Chart review indicates patient workup with gastroenterology opined may have hypomotility -Admit to MedSurg -Pain control -Zofran -  Proton X -GI cocktail -Request a nephrology consult CT indicates volume overload  #2. Hypertension. Variable pressure on admission. Home medication includes labetalol -We'll resume labetalol -Hydralazine as needed  #3. Chronic A. Fib. EKG shows A. fib with right bundle branch block. Rate controlled. Medications include labetalol. Chads score 5. He was on Coumadin in the past has refused -Continue labetalol  #4. Hepatic cirrhosis. Chart review indicates he saw gastroenterology August 2016. Note indicates stable hepatic function  5. End-stage renal disease on dialysis. He was here Friday schedule. CT does indicate volume overload. Chart review indicates dry weight 84 kg noted in discharge summary dated January 2016 -Nephrology consult -May benefit dialysis  #6. Thrombocytopenia/leukopenia. Related to #4. No signs symptoms of active bleeding. Platelets 83 on admission below baseline which appears to be 110-120 -SCDs -Monitor  #7. GERD. Chart review indicates GI recommended Pepcid in the past. Patient unable to tolerate -Proton X twice a day  #8. Chronic biventricular heart failure  predominantly right. Echo in December 2015 shows an EF of 25-30% moderate LVH systolic function severely reduced diffuse hypokinesis. Compensated -Continue beta blocker -Daily weights  #9. History DVT/PE. Previously on Coumadin. Discontinued secondary to spontaneous back hematoma and patient has declined     Code Status: full DVT Prophylaxis: Family Communication: none present Disposition Plan: home hopefully 24 hours  Time spent: 55 minutes  [facility] M [facility]

## 2015-10-14 NOTE — ED Provider Notes (Signed)
CSN: [id number]     Arrival date & time 10/14/15  0253 History   By signing my name below, I, Trevor [name], attest that this documentation has been prepared under the direction and in the presence of Trevor Rhine, MD.  Electronically Signed: Arlan [name], ED Scribe. 10/14/2015. 3:23 AM.   Chief Complaint  Patient presents with  . Abdominal Pain   Patient is a 65 y.o. male presenting with abdominal pain. The history is provided by the patient. No language interpreter was used.  Abdominal Pain Pain location:  Generalized Pain quality: bloating   Pain radiates to:  Does not radiate Pain severity:  Moderate Onset quality:  Gradual Duration:  1 day Timing:  Constant Progression:  Unchanged Chronicity:  Recurrent Relieved by:  None tried Worsened by:  Nothing tried Ineffective treatments:  None tried Associated symptoms: cough   Associated symptoms: no chest pain, no chills, no diarrhea, no fever, no nausea and no vomiting     HPI Comments: Trevor [name] is a 65 y.o. male with a PMHx of CHF, GERD, A-Fib, ESRD, and Hepatitis C who presents to the Emergency Department complaining of constant, ongoing abdominal pain onset yesterday evening after dialysis treatment. Pt also reports abdominal bloating and cough. No aggravating or alleviating factors at this time. No OTC medications or home remedies attempted prior to arrival. No recent fever, chills, nausea. Vomiting, or diarrhea. Pt is dialyzed on Mondays, Wednesdays, and Fridays. PSHx includes umbilical hernia repair performed 03/21/14.  PCP: [name], MD    Past Medical History  Diagnosis Date  . Anemia   . AVF (arteriovenous fistula) (HCC)     Left  . Secondary hyperparathyroidism (HCC)   . Hypovitaminosis D   . Hypertensive urgency     H/o  . CHF (congestive heart failure) (HCC)   . Exertional shortness of breath     "related to infection in my lungs right now" (05/03/2013)  . History of gout     "before I started doing  the dialysis" (05/03/2013)  . Sleep apnea   . GERD (gastroesophageal reflux disease)   . Syncope     felt secondary to residual anesthesia the day before - 2D echo unremarkable  . Pulmonary embolism (HCC)     with right DVT secondary to recent surgery  . Atrial fibrillation (HCC)     not on coumadin due to large spontaneous hematoma on back and anemia  . Myocardial infarction (HCC) 90's  . Coronary artery disease   . Dysrhythmia     afib  . Peripheral vascular disease (HCC)     dvt leg 12/14  . Hypertension   . ESRD (end stage renal disease) on dialysis (HCC)     [facility] mon/wed/fri  . Atrial flutter (HCC) 04/27/2013  . S/P repair of ventral hernia 03/21/2014  . Cirrhosis of liver (HCC) 08-2014  . Hereditary and idiopathic peripheral neuropathy 08/29/2015  . Hepatitis C    Past Surgical History  Procedure Laterality Date  . Av fistula placement Left     Dr. Charlean Sanfilippo; "I've had 2 on the left' (05/03/2013)  . Av fistula placement Right ~ 2011  . Knee arthroscopy Left   . Avgg removal Right 05/04/2013    Procedure: REMOVAL OF ARTERIOVENOUS Fistula Right Arm;  Surgeon: [name], MD;  Location: [facility] OR;  Service: Vascular;  Laterality: Right;  . Insertion of dialysis catheter Right 05/04/2013    Procedure: INSERTION OF DIALYSIS CATHETER;  Surgeon: [name], MD;  Location: [facility] OR;  Service: Vascular;  Laterality: Right;  . Colonoscopy      Hx: of  . Bascilic vein transposition Left 06/27/2013    Procedure: BASCILIC VEIN TRANSPOSITION;  Surgeon: [name], MD;  Location: [facility] OR;  Service: Vascular;  Laterality: Left;  . Cardioversion N/A 08/29/2013    Procedure: CARDIOVERSION;  Surgeon: [name], MD;  Location: [facility] ENDOSCOPY;  Service: Cardiovascular;  Laterality: N/A;  . Removal of a dialysis catheter  2/15  . Hernia repair  03/21/14    Umbilical hernia-Dr. Derrell Lolling  . Umbilical hernia repair N/A 03/21/2014    Procedure: LAPAROSCOPIC UMBILICAL HERNIA REPAIR WITH  MESH;  Surgeon: Axel Filler, MD;  Location: [facility] OR;  Service: General;  Laterality: N/A;  . Insertion of mesh N/A 03/21/2014    Procedure: INSERTION OF MESH;  Surgeon: Axel Filler, MD;  Location: [facility] OR;  Service: General;  Laterality: N/A;  . Venogram Left 05/26/2013    Procedure: VENOGRAM;  Surgeon: Fransisco Hertz, MD;  Location: [facility] CATH LAB;  Service: Cardiovascular;  Laterality: Left;   Family History  Problem Relation Age of Onset  . Hypertension Father   . Kidney disease Father   . Allergies Father   . Deep vein thrombosis Sister   . Pulmonary embolism Sister   . Diabetes Paternal Grandmother    Social History  Substance Use Topics  . Smoking status: Former Smoker -- 0.25 packs/day for .5 years    Types: Cigarettes    Quit date: 09/08/1972  . Smokeless tobacco: Never Used  . Alcohol Use: No     Comment: 05/03/2013 "haven't had a glass of wine in ~ 3 months or so; sometimes will have one w/dinner"    Review of Systems  Constitutional: Negative for fever and chills.  Respiratory: Positive for cough.   Cardiovascular: Negative for chest pain.  Gastrointestinal: Positive for abdominal pain and abdominal distention. Negative for nausea, vomiting and diarrhea.  Neurological: Negative for headaches.  Psychiatric/Behavioral: Negative for confusion.  All other systems reviewed and are negative.     Allergies  Pork-derived products  Home Medications   Prior to Admission medications   Medication Sig Start Date End Date Taking? Authorizing Provider  famotidine (PEPCID) 40 MG tablet Take 1 tablet (40 mg total) by mouth daily. 05/08/15   Louis Meckel, MD  labetalol (NORMODYNE) 200 MG tablet Take 200 mg by mouth 2 (two) times daily.    Historical Provider, MD  lubiprostone (AMITIZA) 8 MCG capsule Take 1 capsule (8 mcg total) by mouth 2 (two) times daily with a meal. 04/19/15   Louis Meckel, MD  Nutritional Supplements [facility]) LIQD Take by mouth daily.    Historical  Provider, MD  sevelamer carbonate (RENVELA) 800 MG tablet Take 4 tablets (3,200 mg total) by mouth 3 (three) times daily with meals. 09/12/14   Stephani Police, PA-C  Vitamins C E (VITAMIN C & E COMPLEX PO) Take by mouth.    Historical Provider, MD   Triage Vitals: BP 157/126 mmHg  Pulse 94  Temp(Src) 98 F (36.7 C) (Oral)  Resp 18  SpO2 100%   Physical Exam  CONSTITUTIONAL: Well developed/well nourished HEAD: Normocephalic/atraumatic EYES: EOMI/PERRL ENMT: Mucous membranes moist NECK: supple no meningeal signs SPINE/BACK:entire spine nontender CV: S1/S2 noted, no murmurs/rubs/gallops noted LUNGS: Lungs are clear to auscultation bilaterally, no apparent distress ABDOMEN: soft, moderate upper epigastric tenderness, no rebound or guarding, bowel sounds noted throughout abdomen GU:no cva tenderness NEURO: Pt is  awake/alert/appropriate, moves all extremitiesx4.  No facial droop.   EXTREMITIES: pulses normal/equal, full ROM. Dialysis access to L arm with thrill noted SKIN: warm, color normal PSYCH: no abnormalities of mood noted, alert and oriented to situation   ED Course  Procedures   DIAGNOSTIC STUDIES: Oxygen Saturation is 100% on RA, Normal by my interpretation.    COORDINATION OF CARE: 3:40 AM-Discussed treatment plan with pt at bedside and pt agreed to plan.    pt with continued abdominal pain despite multiple medication treatments He reports he is not improved and feels very uncomfortable CT imaging negative for acute disease He thinks he needs dialysis D/w karen black with [facility] will admit She requests nephrology consult - spoke to dr patel from nephrology  Labs Review Labs Reviewed  COMPREHENSIVE METABOLIC PANEL - Abnormal; Notable for the following:    Chloride 96 (*)    BUN 35 (*)    Creatinine, Ser 7.34 (*)    Total Protein 8.4 (*)    ALT 16 (*)    Alkaline Phosphatase 174 (*)    Total Bilirubin 1.5 (*)    GFR calc non Af Amer 7 (*)    GFR calc Af Amer 8  (*)    All other components within normal limits  CBC WITH DIFFERENTIAL/PLATELET - Abnormal; Notable for the following:    WBC 2.4 (*)    RBC 3.95 (*)    Hemoglobin 11.6 (*)    HCT 34.6 (*)    RDW 17.5 (*)    Platelets 83 (*)    Neutro Abs 1.2 (*)    All other components within normal limits  LIPASE, BLOOD - Abnormal; Notable for the following:    Lipase 71 (*)    All other components within normal limits    Imaging Review Ct Abdomen Pelvis W Contrast  10/14/2015  CLINICAL DATA:  Abdominal pain beginning in dialysis yesterday EXAM: CT ABDOMEN AND PELVIS WITH CONTRAST TECHNIQUE: Multidetector CT imaging of the abdomen and pelvis was performed using the standard protocol following bolus administration of intravenous contrast. CONTRAST:  75mL OMNIPAQUE IOHEXOL 300 MG/ML  SOLN COMPARISON:  08/30/2014 FINDINGS: Lower chest and abdominal wall: Chronic cardiomegaly with marked atrial enlargement. There is chronic calcified hilar and mediastinal adenopathy compatible with granulomatous disease. Mild reticulation in the lower lungs which is chronic or atelectatic. No pneumonia or edema suspected. Hepatobiliary: Large appearance of the liver without acute finding. Stable 14 mm subcapsular nodule in segment 6. Hazy high-density layering in the gallbladder, likely sludge. No calcified gallstone. Pericholecystic edema is mild and in keeping with diffuse retroperitoneal edema a considered reactive (could be secondary to patient's volume overload, hepatitis-C, or right heart dysfunction). Pancreas: Unremarkable. Spleen: Chronic mild splenomegaly.  Splenic thickness is 65 mm. Adrenals/Urinary Tract: Negative adrenals. No hydronephrosis or stone. Smooth renal atrophy with simple appearing cysts. Right lower pole nephrolithiasis. No evidence of mass lesion. Decompressed bladder which is unremarkable. Reproductive:Dystrophic prostate calcifications Stomach/Bowel: No obstruction. No inflammatory changes when accounting  for sub peritoneal edema from volume overload; no appendicitis. Vascular/Lymphatic: Diffuse atherosclerotic calcification. No finding. There is bulky adenopathy in the hepatic hilum with faint calcifications, stable since 2015. Portacaval node measures 33 mm in short axis. Peritoneal: Small ascites in the pelvis, likely from volume overload. Musculoskeletal: No acute abnormalities. 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 IMPRESSION: 1. No definitive explanation for abdominal pain. 2. Chronic granulomatous disease with adenopathy and splenomegaly. 3. Volume overload. Electronically Signed   By: Marnee Spring M.D.   On:  10/14/2015 06:37   I have personally reviewed and evaluated these  lab results as part of my medical decision-making.   EKG Interpretation   Date/Time:  Sunday October 14 2015 04:26:57 EST Ventricular Rate:  92 PR Interval:    QRS Duration: 115 QT Interval:  426 QTC Calculation: 527 R Axis:   119 Text Interpretation:  Atrial fibrillation Incomplete right bundle branch  block No significant change since last tracing Confirmed by Bebe Shaggy  MD,  Odessia Asleson ([id number]) on 10/14/2015 4:46:14 AM     Medications  morphine 4 MG/ML injection 4 mg (4 mg Intravenous Given 10/14/15 0406)  ondansetron (ZOFRAN) injection 4 mg (4 mg Intravenous Given 10/14/15 0406)  morphine 4 MG/ML injection 4 mg (4 mg Intravenous Given 10/14/15 0518)  iohexol (OMNIPAQUE) 300 MG/ML solution 100 mL (75 mLs Intravenous Contrast Given 10/14/15 0553)    MDM   Final diagnoses:  Intractable abdominal pain  Chronic renal failure, unspecified stage   Previous records reviewed and considered Nursing notes including past medical history and social history reviewed and considered in documentation Labs/vital reviewed myself and considered during evaluation   I personally performed the services described in this documentation, which was scribed in my presence. The recorded information has been reviewed and is accurate.        Trevor Rhine, MD 10/14/15 [phone]

## 2015-10-14 NOTE — ED Notes (Signed)
Attempted report x 2 

## 2015-10-14 NOTE — ED Notes (Signed)
Attempted Report x1.   

## 2015-10-14 NOTE — Consult Note (Addendum)
Reason for Consult: Continuity of ESRD care Referring Physician: [name] M.D. (ED physician)  HPI:  65 year old African-American man with past medical history significant for end-stage renal disease on hemodialysis Monday/Wednesday/Friday at [facility]. He also has underlying history of congestive heart failure, hypertension, history of PE (provoked), atrial fibrillation (not on anticoagulation due to prior spontaneous hematoma) and history of chronic hepatitis C with cirrhosis. He has had repair of a ventral hernia in the past and reports that since then he has been having intermittent episodes of abdominal distention/bloating and discomfort. He is admitted with abdominal pain along with distention and bloating-initial CT scan does not reveal significant amount of ascites to prompt a need for paracentesis.  He denies any chest pain or shortness of breath and does not have any nausea, vomiting or diarrhea. He reports regular bowel movements and denies any constipation. He denies any urinary symptoms and states that he supplements his diet with probiotics and ox bile supplement.  Dialysis prescription: [facility] Monday/Wednesday/Friday, 4 hours 15 minutes, 200NR dialyzer, blood flow 500, dialysate flow 800, estimated dry weight 91 kg, 2K/2 calcium, no UF profile, no sodium profile. Left brachiobasilic fistula. [name] 5 g IV every Monday Wednesday Friday. No heparin.   Past Medical History  Diagnosis Date  . Anemia   . AVF (arteriovenous fistula) (HCC)     Left  . Secondary hyperparathyroidism (HCC)   . Hypovitaminosis D   . Hypertensive urgency     H/o  . CHF (congestive heart failure) (HCC)   . Exertional shortness of breath     "related to infection in my lungs right now" (05/03/2013)  . History of gout     "before I started doing the dialysis" (05/03/2013)  . Sleep apnea   . GERD (gastroesophageal reflux disease)   . Syncope     felt secondary  to residual anesthesia the day before - 2D echo unremarkable  . Pulmonary embolism (HCC)     with right DVT secondary to recent surgery  . Atrial fibrillation (HCC)     not on coumadin due to large spontaneous hematoma on back and anemia  . Myocardial infarction (HCC) 90's  . Coronary artery disease   . Dysrhythmia     afib  . Peripheral vascular disease (HCC)     dvt leg 12/14  . Hypertension   . ESRD (end stage renal disease) on dialysis (HCC)     [facility] mon/wed/fri  . Atrial flutter (HCC) 04/27/2013  . S/P repair of ventral hernia 03/21/2014  . Cirrhosis of liver (HCC) 08-2014  . Hereditary and idiopathic peripheral neuropathy 08/29/2015  . Hepatitis C     Past Surgical History  Procedure Laterality Date  . Av fistula placement Left     Dr. Charlean Sanfilippo; "I've had 2 on the left' (05/03/2013)  . Av fistula placement Right ~ 2011  . Knee arthroscopy Left   . Avgg removal Right 05/04/2013    Procedure: REMOVAL OF ARTERIOVENOUS Fistula Right Arm;  Surgeon: [name], MD;  Location: [facility] OR;  Service: Vascular;  Laterality: Right;  . Insertion of dialysis catheter Right 05/04/2013    Procedure: INSERTION OF DIALYSIS CATHETER;  Surgeon: [name], MD;  Location: [facility] OR;  Service: Vascular;  Laterality: Right;  . Colonoscopy      Hx: of  . Bascilic vein transposition Left 06/27/2013    Procedure: BASCILIC VEIN TRANSPOSITION;  Surgeon: [name], MD;  Location: [facility] OR;  Service:  Vascular;  Laterality: Left;  . Cardioversion N/A 08/29/2013    Procedure: CARDIOVERSION;  Surgeon: [name], MD;  Location: [facility] ENDOSCOPY;  Service: Cardiovascular;  Laterality: N/A;  . Removal of a dialysis catheter  2/15  . Hernia repair  03/21/14    Umbilical hernia-Dr. Derrell Lolling  . Umbilical hernia repair N/A 03/21/2014    Procedure: LAPAROSCOPIC UMBILICAL HERNIA REPAIR WITH MESH;  Surgeon: Axel Filler, MD;  Location: [facility] OR;  Service: General;  Laterality: N/A;  . Insertion of mesh N/A  03/21/2014    Procedure: INSERTION OF MESH;  Surgeon: Axel Filler, MD;  Location: [facility] OR;  Service: General;  Laterality: N/A;  . Venogram Left 05/26/2013    Procedure: VENOGRAM;  Surgeon: Fransisco Hertz, MD;  Location: [facility] CATH LAB;  Service: Cardiovascular;  Laterality: Left;    Family History  Problem Relation Age of Onset  . Hypertension Father   . Kidney disease Father   . Allergies Father   . Deep vein thrombosis Sister   . Pulmonary embolism Sister   . Diabetes Paternal Grandmother     Social History:  reports that he quit smoking about 43 years ago. His smoking use included Cigarettes. He has a .125 pack-year smoking history. He has never used smokeless tobacco. He reports that he does not drink alcohol or use illicit drugs.  Allergies:  Allergies  Allergen Reactions  . Pork-Derived Products Nausea And Vomiting    Culture    Medications:  Scheduled: . gi cocktail  30 mL Oral Once  . [START ON 10/15/2015] labetalol  200 mg Oral Daily  . pantoprazole (PROTONIX) IV  40 mg Intravenous Q12H  . sevelamer carbonate  2,400 mg Oral TID WC  . sodium chloride flush  3 mL Intravenous Q12H    BMP Latest Ref Rng 10/14/2015 09/11/2014 09/10/2014  Glucose 65 - 99 mg/dL 88 81 85  BUN 6 - 20 mg/dL 16(X) 09(U) 04(V)  Creatinine 0.61 - 1.24 mg/dL 4.09(W) 1.19(J) 4.78(G)  Sodium 135 - 145 mmol/L 136 136 136  Potassium 3.5 - 5.1 mmol/L 5.0 4.6 4.3  Chloride 101 - 111 mmol/L 96(L) 101 99  CO2 22 - 32 mmol/L Calcium 8.9 - 10.3 mg/dL 9.7 8.7 8.7   CBC Latest Ref Rng 10/14/2015 09/11/2014 09/09/2014  WBC 4.0 - 10.5 K/uL 2.4(L) 4.2 4.5  Hemoglobin 13.0 - 17.0 g/dL 11.6(L) 13.8 14.7  Hematocrit 39.0 - 52.0 % 34.6(L) 40.3 42.4  Platelets 150 - 400 K/uL 83(L) 122(L) 106(L)     Ct Abdomen Pelvis W Contrast  10/14/2015  CLINICAL DATA:  Abdominal pain beginning in dialysis yesterday EXAM: CT ABDOMEN AND PELVIS WITH CONTRAST TECHNIQUE: Multidetector CT imaging of the abdomen and pelvis was  performed using the standard protocol following bolus administration of intravenous contrast. CONTRAST:  75mL OMNIPAQUE IOHEXOL 300 MG/ML  SOLN COMPARISON:  08/30/2014 FINDINGS: Lower chest and abdominal wall: Chronic cardiomegaly with marked atrial enlargement. There is chronic calcified hilar and mediastinal adenopathy compatible with granulomatous disease. Mild reticulation in the lower lungs which is chronic or atelectatic. No pneumonia or edema suspected. Hepatobiliary: Large appearance of the liver without acute finding. Stable 14 mm subcapsular nodule in segment 6. Hazy high-density layering in the gallbladder, likely sludge. No calcified gallstone. Pericholecystic edema is mild and in keeping with diffuse retroperitoneal edema a considered reactive (could be secondary to patient's volume overload, hepatitis-C, or right heart dysfunction). Pancreas: Unremarkable. Spleen: Chronic mild splenomegaly.  Splenic thickness is 65 mm. Adrenals/Urinary  Tract: Negative adrenals. No hydronephrosis or stone. Smooth renal atrophy with simple appearing cysts. Right lower pole nephrolithiasis. No evidence of mass lesion. Decompressed bladder which is unremarkable. Reproductive:Dystrophic prostate calcifications Stomach/Bowel: No obstruction. No inflammatory changes when accounting for sub peritoneal edema from volume overload; no appendicitis. Vascular/Lymphatic: Diffuse atherosclerotic calcification. No finding. There is bulky adenopathy in the hepatic hilum with faint calcifications, stable since 2015. Portacaval node measures 33 mm in short axis. Peritoneal: Small ascites in the pelvis, likely from volume overload. Musculoskeletal: No acute abnormalities. 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 IMPRESSION: 1. No definitive explanation for abdominal pain. 2. Chronic granulomatous disease with adenopathy and splenomegaly. 3. Volume overload. Electronically Signed   By: Marnee Spring M.D.   On: 10/14/2015 06:37     Review of Systems  Constitutional: Positive for malaise/fatigue. Negative for fever and chills.  HENT: Negative.   Eyes: Negative.   Respiratory: Negative.   Cardiovascular: Negative.   Gastrointestinal: Positive for abdominal pain. Negative for heartburn, nausea, vomiting, diarrhea and constipation.       Abdominal bloating/distention/pain  Genitourinary: Negative.   Musculoskeletal: Negative.        Reports that his legs feel tight "as if my muscles are contracted"  Skin: Positive for itching. Negative for rash.  Neurological: Negative.  Negative for weakness.  Endo/Heme/Allergies: Negative.    Blood pressure 104/80, pulse 49, temperature 97.6 F (36.4 C), temperature source Oral, resp. rate 17, SpO2 97 %. Physical Exam  Nursing note and vitals reviewed. Constitutional: He is oriented to person, place, and time. He appears well-developed and well-nourished. No distress.  HENT:  Head: Normocephalic and atraumatic.  Nose: Nose normal.  Mouth/Throat: Oropharynx is clear and moist.  Eyes: EOM are normal. Pupils are equal, round, and reactive to light. No scleral icterus.  Neck: Normal range of motion. Neck supple. No JVD present.  Cardiovascular: Normal rate, regular rhythm and normal heart sounds.   No murmur heard. Respiratory: Effort normal and breath sounds normal. He has no wheezes. He has no rales.  GI: Soft. Bowel sounds are normal. He exhibits distension. There is no tenderness. There is no rebound.  Tympanitic on percussion  Musculoskeletal: Normal range of motion. He exhibits no edema.  Left brachio basilic fistula-small aneurysms, good thrill  Neurological: He is alert and oriented to person, place, and time.  Skin: Skin is warm and dry. No rash noted. No erythema.    Assessment/Plan: 1. Abdominal pain/distention: Clinically and by radiological evaluation without significant evidence of ascites to prompt intervention. Atypical tenderness primarily towards the  flanks without any urinary symptoms. Ongoing pain management and will attempt efforts at lowering dry weight with hemodialysis tomorrow. Elevated lipase level not felt to be consistent with pancreatitis based on clinical presentation. Management per admitting service. 2. End-stage renal disease: Hemodialysis per outpatient schedule tomorrow with efforts at lowering dry weight. 3. Hypertension: Blood pressures appear to be fairly controlled at this time, usual antihypertensive therapy has been resumed. 4. Metabolic bone disease: Sevelamer restarted with meals 3 times a day before meals, will restart [name]. 5. Anemia of chronic kidney disease: Hemoglobin currently appears to be at goal-chronic thrombocytopenia likely associated with cirrhosis/splenic sequestration. No indications for ESA on overt loss.   Allen Egerton K. 10/14/2015, 11:02 AM

## 2015-10-14 NOTE — ED Notes (Signed)
MD Wickline at the bedside  

## 2015-10-14 NOTE — ED Notes (Signed)
Admitting MD at the bedside.  

## 2015-10-14 NOTE — ED Notes (Signed)
Pt arrives POV with c/o bloating, abdominal pain since Friday. Cough, no NVD. MWF dialysis patient. Recent hep c dx.

## 2015-10-14 NOTE — Progress Notes (Signed)
Rt note: Pt has refused cpap at this time.  Rt will monitor.

## 2015-10-15 DIAGNOSIS — N186 End stage renal disease: Secondary | ICD-10-CM | POA: Diagnosis not present

## 2015-10-15 DIAGNOSIS — I120 Hypertensive chronic kidney disease with stage 5 chronic kidney disease or end stage renal disease: Secondary | ICD-10-CM | POA: Diagnosis not present

## 2015-10-15 DIAGNOSIS — N2581 Secondary hyperparathyroidism of renal origin: Secondary | ICD-10-CM | POA: Diagnosis not present

## 2015-10-15 DIAGNOSIS — I482 Chronic atrial fibrillation: Secondary | ICD-10-CM | POA: Diagnosis not present

## 2015-10-15 DIAGNOSIS — K7469 Other cirrhosis of liver: Secondary | ICD-10-CM

## 2015-10-15 DIAGNOSIS — Z992 Dependence on renal dialysis: Secondary | ICD-10-CM | POA: Diagnosis not present

## 2015-10-15 DIAGNOSIS — R140 Abdominal distension (gaseous): Secondary | ICD-10-CM | POA: Diagnosis not present

## 2015-10-15 DIAGNOSIS — R109 Unspecified abdominal pain: Secondary | ICD-10-CM | POA: Diagnosis not present

## 2015-10-15 LAB — RENAL FUNCTION PANEL
ANION GAP: 14 (ref 5–15)
Albumin: 3.5 g/dL (ref 3.5–5.0)
BUN: 46 mg/dL — ABNORMAL HIGH (ref 6–20)
CO2: 24 mmol/L (ref 22–32)
Calcium: 9.2 mg/dL (ref 8.9–10.3)
Chloride: 95 mmol/L — ABNORMAL LOW (ref 101–111)
Creatinine, Ser: 9 mg/dL — ABNORMAL HIGH (ref 0.61–1.24)
GFR calc Af Amer: 6 mL/min — ABNORMAL LOW (ref >60)
GFR calc non Af Amer: 6 mL/min — ABNORMAL LOW (ref >60)
GLUCOSE: 91 mg/dL (ref 65–99)
POTASSIUM: 5.6 mmol/L — AB (ref 3.5–5.1)
Phosphorus: 6.1 mg/dL — ABNORMAL HIGH (ref 2.5–4.6)
Sodium: 133 mmol/L — ABNORMAL LOW (ref 135–145)

## 2015-10-15 LAB — CBC
HEMATOCRIT: 34.7 % — AB (ref 39.0–52.0)
HEMATOCRIT: 33.7 % — AB (ref 39.0–52.0)
HEMOGLOBIN: 11.6 g/dL — AB (ref 13.0–17.0)
HEMOGLOBIN: 11.4 g/dL — AB (ref 13.0–17.0)
MCH: 28.9 pg (ref 26.0–34.0)
MCH: 29.2 pg (ref 26.0–34.0)
MCHC: 33.4 g/dL (ref 30.0–36.0)
MCHC: 33.8 g/dL (ref 30.0–36.0)
MCV: 86.5 fL (ref 78.0–100.0)
MCV: 86.2 fL (ref 78.0–100.0)
Platelets: 79 10*3/uL — ABNORMAL LOW (ref 150–400)
RBC: 4.01 MIL/uL — ABNORMAL LOW (ref 4.22–5.81)
RBC: 3.91 MIL/uL — ABNORMAL LOW (ref 4.22–5.81)
RDW: 17.5 % — ABNORMAL HIGH (ref 11.5–15.5)
RDW: 17.5 % — ABNORMAL HIGH (ref 11.5–15.5)
WBC: 3.1 10*3/uL — ABNORMAL LOW (ref 4.0–10.5)
WBC: 3 10*3/uL — ABNORMAL LOW (ref 4.0–10.5)

## 2015-10-15 LAB — BASIC METABOLIC PANEL
ANION GAP: 15 (ref 5–15)
BUN: 45 mg/dL — ABNORMAL HIGH (ref 6–20)
CALCIUM: 9.2 mg/dL (ref 8.9–10.3)
CO2: 26 mmol/L (ref 22–32)
Chloride: 93 mmol/L — ABNORMAL LOW (ref 101–111)
Creatinine, Ser: 9.07 mg/dL — ABNORMAL HIGH (ref 0.61–1.24)
GFR calc Af Amer: 6 mL/min — ABNORMAL LOW (ref >60)
GFR calc non Af Amer: 5 mL/min — ABNORMAL LOW (ref >60)
GLUCOSE: 58 mg/dL — AB (ref 65–99)
POTASSIUM: 5.6 mmol/L — AB (ref 3.5–5.1)
Sodium: 134 mmol/L — ABNORMAL LOW (ref 135–145)

## 2015-10-15 MED ORDER — PENTAFLUOROPROP-TETRAFLUOROETH EX AERO: 1.0000 "application " | INHALATION_SPRAY | CUTANEOUS | Status: DC | PRN

## 2015-10-15 MED ORDER — DOXERCALCIFEROL 4 MCG/2ML IV SOLN
INTRAVENOUS | Status: AC
  Filled 2015-10-15: qty 2

## 2015-10-15 MED ORDER — ALTEPLASE 2 MG IJ SOLR: 2.0000 mg | Freq: Once | INTRAMUSCULAR | Status: DC | PRN

## 2015-10-15 MED ORDER — PANTOPRAZOLE SODIUM 40 MG PO TBEC
40.0000 mg | DELAYED_RELEASE_TABLET | Freq: Two times a day (BID) | ORAL | Status: DC
  Administered 2015-10-15 – 2015-10-19 (×9): 40 mg via ORAL
  Filled 2015-10-15 (×9): qty 1

## 2015-10-15 MED ORDER — DIPHENHYDRAMINE HCL 25 MG PO CAPS
25.0000 mg | ORAL_CAPSULE | Freq: Four times a day (QID) | ORAL | Status: DC | PRN
  Administered 2015-10-15 – 2015-10-16 (×4): 25 mg via ORAL
  Filled 2015-10-15 (×4): qty 1

## 2015-10-15 MED ORDER — SODIUM CHLORIDE 0.9 % IV SOLN: 100.0000 mL | INTRAVENOUS | Status: DC | PRN

## 2015-10-15 MED ORDER — LIDOCAINE HCL (PF) 1 % IJ SOLN: 5.0000 mL | INTRAMUSCULAR | Status: DC | PRN

## 2015-10-15 MED ORDER — LIDOCAINE-PRILOCAINE 2.5-2.5 % EX CREA
1.0000 | TOPICAL_CREAM | CUTANEOUS | Status: DC | PRN
Start: 2015-10-15 — End: 2015-10-15

## 2015-10-15 NOTE — Progress Notes (Signed)
[facility] Progress Note  Assessment/Plan: 1. Abdominal pain/distention: Per Primary. Clinically and by radiological evaluation without significant evidence of ascites to prompt intervention. Atypical tenderness primarily towards the flanks without any urinary symptoms. Ongoing pain management and will attempt efforts at lowering dry weight with hemodialysis tomorrow. Elevated lipase level not felt to be consistent with pancreatitis based on clinical presentation.  Abdominal pain now controlled with morphine.  2. End-stage renal disease: MWF at [facility]. Had HD today. Tolerated well. K+5.6. 3. Hypertension: BP slightly soft-probably from PRN morphine . On labetolol 200 mg PO daily.  Net UF today 3500. Post wt. 92.4  4. Metabolic bone disease: Sevelamer restarted with meals 3 times a day before meals, will restart [name]. 5. 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  6. Nutrition: Albumin 3.3. Renal diet, fld restrictions.   Rita H. Brown NP-C 10/15/2015, 11:38 AM  [facility] [phone]  Pt seen, examined and agree w A/P as above.  [name] MD [facility] pager [phone]    cell [phone] 10/15/2015, 2:17 PM     Subjective: "My pain in controlled with morphine". No C/O pain/discomfort. Seen at end of HD. Tolerated procedure well.     Objective Filed Vitals:   10/15/15 0929 10/15/15 1000 10/15/15 1030 10/15/15 1100  BP: 110/80 107/74  97/66  Pulse: 77 79 90 92  Temp:      TempSrc:      Resp: 18 27 28 27   Weight:      SpO2:       Physical Exam General: Well nourished male in NAD Heart: Irregular, S1, S2, Afib on monitor.  Lungs: Bilateral breath sounds CTA Abdomen: abdomen soft, nontender at present.Nondistended.  Extremities: No LE edema Dialysis Access: LUA AVF + bruit  Dialysis Orders: SW. [facility] Monday/Wednesday/Friday, 4 hours 15 minutes,  200NR dialyzer, blood flow 500, dialysate flow 800, estimated dry weight 91 kg, 2K/2 calcium, no UF profile, no sodium profile. Left brachiobasilic fistula. [name] 5 g IV every Monday Wednesday Friday. No heparin.   Additional Objective Labs: Basic Metabolic Panel:  Recent Labs Lab 10/14/15 0302 10/15/15 0624 10/15/15 0710  NA 136 134* 133*  K 5.0 5.6* 5.6*  CL 96* 93* 95*  CO2 29 26 24   GLUCOSE 88 58* 91  BUN 35* 45* 46*  CREATININE 7.34* 9.07* 9.00*  CALCIUM 9.7 9.2 9.2  PHOS  --   --  6.1*   Liver Function Tests:  Recent Labs Lab 10/14/15 0302 10/15/15 0710  AST 34  --   ALT 16*  --   ALKPHOS 174*  --   BILITOT 1.5*  --   PROT 8.4*  --   ALBUMIN 3.5 3.5    Recent Labs Lab 10/14/15 0302  LIPASE 71*   CBC:  Recent Labs Lab 10/14/15 0302 10/15/15 0624 10/15/15 0710  WBC 2.4* 3.1* 3.0*  NEUTROABS 1.2*  --   --   HGB 11.6* 11.6* 11.4*  HCT 34.6* 34.7* 33.7*  MCV 87.6 86.5 86.2  PLT 83* 79*  --    Blood Culture    Component Value Date/Time   SDES PERITONEAL FLUID 09/02/2014 1344   SPECREQUEST NONE 09/02/2014 1344   CULT  09/02/2014 1344    NO GROWTH 3 DAYS Performed at [facility]    REPTSTATUS 09/05/2014 FINAL 09/02/2014 1344    Cardiac Enzymes: No results for input(s): CKTOTAL, CKMB, CKMBINDEX, TROPONINI in the last 168 hours. CBG: No results for input(s):  GLUCAP in the last 168 hours. Iron Studies: No results for input(s): IRON, TIBC, TRANSFERRIN, FERRITIN in the last 72 hours. @ Studies/Results: Ct Abdomen Pelvis W Contrast  10/14/2015  CLINICAL DATA:  Abdominal pain beginning in dialysis yesterday EXAM: CT ABDOMEN AND PELVIS WITH CONTRAST TECHNIQUE: Multidetector CT imaging of the abdomen and pelvis was performed using the standard protocol following bolus administration of intravenous contrast. CONTRAST:  75mL OMNIPAQUE IOHEXOL 300 MG/ML  SOLN COMPARISON:  08/30/2014 FINDINGS: Lower chest and abdominal wall: Chronic  cardiomegaly with marked atrial enlargement. There is chronic calcified hilar and mediastinal adenopathy compatible with granulomatous disease. Mild reticulation in the lower lungs which is chronic or atelectatic. No pneumonia or edema suspected. Hepatobiliary: Large appearance of the liver without acute finding. Stable 14 mm subcapsular nodule in segment 6. Hazy high-density layering in the gallbladder, likely sludge. No calcified gallstone. Pericholecystic edema is mild and in keeping with diffuse retroperitoneal edema a considered reactive (could be secondary to patient's volume overload, hepatitis-C, or right heart dysfunction). Pancreas: Unremarkable. Spleen: Chronic mild splenomegaly.  Splenic thickness is 65 mm. Adrenals/Urinary Tract: Negative adrenals. No hydronephrosis or stone. Smooth renal atrophy with simple appearing cysts. Right lower pole nephrolithiasis. No evidence of mass lesion. Decompressed bladder which is unremarkable. Reproductive:Dystrophic prostate calcifications Stomach/Bowel: No obstruction. No inflammatory changes when accounting for sub peritoneal edema from volume overload; no appendicitis. Vascular/Lymphatic: Diffuse atherosclerotic calcification. No finding. There is bulky adenopathy in the hepatic hilum with faint calcifications, stable since 2015. Portacaval node measures 33 mm in short axis. Peritoneal: Small ascites in the pelvis, likely from volume overload. Musculoskeletal: No acute abnormalities. 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 IMPRESSION: 1. No definitive explanation for abdominal pain. 2. Chronic granulomatous disease with adenopathy and splenomegaly. 3. Volume overload. Electronically Signed   By: Marnee Spring M.D.   On: 10/14/2015 06:37   Medications:   . doxercalciferol  5 mcg Intravenous Q M,W,F-HD  . labetalol  200 mg Oral Daily  . pantoprazole (PROTONIX) IV  40 mg Intravenous Q12H  . sevelamer carbonate  2,400 mg Oral TID WC  . sodium chloride  flush  3 mL Intravenous Q12H

## 2015-10-15 NOTE — Progress Notes (Signed)
[facility] PROGRESS NOTE    Progress Note   Trevor [name] EAV:[id number] DOB: Mar 19, 1951 DOA: 10/14/2015 PCP: [name], MD   Brief Narrative:   Trevor [name] is an 65 y.o. male past medical history of end-stage renal disease, liver disease and biventricular failure who presents to the emergency room with uncontrollable abdominal pain with intractable nausea and vomiting.  Assessment/Plan:   Intractable abdominal pain: CT scan of the abdomen and pelvis show no acute findings, but did show some adenopathy and splenomegaly with mild fluid overload He has massive JVD positive, hepatojugular reflex and a history of right-sided heart failure, which all this volume overload could be contributing to his abdominal pain. Discussed with renal to see if we can dialyze again tomorrow. Check a CK.  ANEMIA, IRON DEFICIENCY Due to renal disease further management per renal.  Essential hypertension Continue to continue current management.  ESRD on hemodialysis [facility]) Continue dialysis Monday Wednesday and Fridays. We'll ask renal to see me dialyze again tomorrow as I think his volume overload is concluding to his abdominal pain.  Chronic Thrombocytopenia (HCC) At baseline the T and monitor.  Chronic atrial fibrillation (HCC): Great control continue Coumadin per pharmacy.  Hepatic cirrhosis (HCC) Follow-up with [facility] urology as an outpatient.    DVT Prophylaxis - Lovenox ordered.  Family Communication: none Disposition Plan: Home 2-3 days Code Status:     Code Status Orders        Start     Ordered   10/14/15 0749  Full code   Continuous     10/14/15 0751    Code Status History    Date Active Date Inactive Code Status Order ID Comments User Context   09/05/2014  5:02 PM 09/12/2014  6:43 PM Full Code [id number]  Malachy Moan, MD Inpatient   08/30/2014  9:45 AM 09/05/2014  5:02 PM Full Code [id number]  Osvaldo Shipper, MD ED   03/21/2014  6:30 PM 03/23/2014  4:30 PM  Full Code [id number]  Axel Filler, MD Inpatient   09/01/2013  7:46 AM 09/06/2013  8:25 PM Full Code [id number]  Houston Siren, MD Inpatient   06/28/2013  9:13 PM 07/04/2013  6:19 PM Full Code [id number]  Lorretta Harp, MD Inpatient   05/03/2013  1:49 AM 05/07/2013  5:12 PM Full Code [id number]  Mariea Stable, MD Inpatient   04/28/2013  6:35 PM 04/30/2013  2:06 PM Full Code [id number]  Drema Dallas, MD Inpatient    Advance Directive Documentation        Most Recent Value   Type of Advance Directive  Living will, Healthcare Power of Attorney   Pre-existing out of facility DNR order (yellow form or pink MOST form)     "MOST" Form in Place?          IV Access:    Peripheral IV   Procedures and diagnostic studies:   Ct Abdomen Pelvis W Contrast  10/14/2015  CLINICAL DATA:  Abdominal pain beginning in dialysis yesterday EXAM: CT ABDOMEN AND PELVIS WITH CONTRAST TECHNIQUE: Multidetector CT imaging of the abdomen and pelvis was performed using the standard protocol following bolus administration of intravenous contrast. CONTRAST:  75mL OMNIPAQUE IOHEXOL 300 MG/ML  SOLN COMPARISON:  08/30/2014 FINDINGS: Lower chest and abdominal wall: Chronic cardiomegaly with marked atrial enlargement. There is chronic calcified hilar and mediastinal adenopathy compatible with granulomatous disease. Mild reticulation in the lower lungs which is chronic or atelectatic. No pneumonia or edema suspected. Hepatobiliary: Large appearance of the liver without acute  finding. Stable 14 mm subcapsular nodule in segment 6. Hazy high-density layering in the gallbladder, likely sludge. No calcified gallstone. Pericholecystic edema is mild and in keeping with diffuse retroperitoneal edema a considered reactive (could be secondary to patient's volume overload, hepatitis-C, or right heart dysfunction). Pancreas: Unremarkable. Spleen: Chronic mild splenomegaly.  Splenic thickness is 65 mm. Adrenals/Urinary Tract: Negative adrenals. No  hydronephrosis or stone. Smooth renal atrophy with simple appearing cysts. Right lower pole nephrolithiasis. No evidence of mass lesion. Decompressed bladder which is unremarkable. Reproductive:Dystrophic prostate calcifications Stomach/Bowel: No obstruction. No inflammatory changes when accounting for sub peritoneal edema from volume overload; no appendicitis. Vascular/Lymphatic: Diffuse atherosclerotic calcification. No finding. There is bulky adenopathy in the hepatic hilum with faint calcifications, stable since 2015. Portacaval node measures 33 mm in short axis. Peritoneal: Small ascites in the pelvis, likely from volume overload. Musculoskeletal: No acute abnormalities. 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 IMPRESSION: 1. No definitive explanation for abdominal pain. 2. Chronic granulomatous disease with adenopathy and splenomegaly. 3. Volume overload. Electronically Signed   By: Marnee Spring M.D.   On: 10/14/2015 06:37     Medical Consultants:    None.  Anti-Infectives:   Anti-infectives    None      Subjective:    Trevor [name] continues to complain of abdominal pain he is still hungry.  Objective:    Filed Vitals:   10/15/15 1000 10/15/15 1030 10/15/15 1100 10/15/15 1120  BP: 107/74 102/72 97/66 124/84  Pulse: 79 90 92 85  Temp:    97.9 F (36.6 C)  TempSrc:    Oral  Resp: 27 28 27 22   Weight:    91.4 kg (201 lb 8 oz)  SpO2:        Intake/Output Summary (Last 24 hours) at 10/15/15 1208 Last data filed at 10/15/15 1120  Gross per 24 hour  Intake    480 ml  Output   3500 ml  Net  -3020 ml   Filed Weights   10/15/15 0645 10/15/15 1120  Weight: 96.5 kg (212 lb 11.9 oz) 91.4 kg (201 lb 8 oz)    Exam: Gen:  NAD Cardiovascular:  RRR,JVD to the ear lobe,  Chest and lungs:   CTAB Abdomen:  Tense and distended abdomen no rebound or guarding. Extremities:  No C/E/C   Data Reviewed:    Labs: Basic Metabolic Panel:  Recent Labs Lab 10/14/15 0302  10/15/15 0624 10/15/15 0710  NA 136 134* 133*  K 5.0 5.6* 5.6*  CL 96* 93* 95*  CO2 29 26 24   GLUCOSE 88 58* 91  BUN 35* 45* 46*  CREATININE 7.34* 9.07* 9.00*  CALCIUM 9.7 9.2 9.2  PHOS  --   --  6.1*   GFR Estimated Creatinine Clearance: 9.8 mL/min (by C-G formula based on Cr of 9). Liver Function Tests:  Recent Labs Lab 10/14/15 0302 10/15/15 0710  AST 34  --   ALT 16*  --   ALKPHOS 174*  --   BILITOT 1.5*  --   PROT 8.4*  --   ALBUMIN 3.5 3.5    Recent Labs Lab 10/14/15 0302  LIPASE 71*   No results for input(s): AMMONIA in the last 168 hours. Coagulation profile No results for input(s): INR, PROTIME in the last 168 hours.  CBC:  Recent Labs Lab 10/14/15 0302 10/15/15 0624 10/15/15 0710  WBC 2.4* 3.1* 3.0*  NEUTROABS 1.2*  --   --   HGB 11.6* 11.6* 11.4*  HCT 34.6* 34.7* 33.7*  MCV 87.6 86.5 86.2  PLT 83* 79*  --    Cardiac Enzymes: No results for input(s): CKTOTAL, CKMB, CKMBINDEX, TROPONINI in the last 168 hours. BNP (last 3 results) No results for input(s): PROBNP in the last 8760 hours. CBG: No results for input(s): GLUCAP in the last 168 hours. D-Dimer: No results for input(s): DDIMER in the last 72 hours. Hgb A1c: No results for input(s): HGBA1C in the last 72 hours. Lipid Profile: No results for input(s): CHOL, HDL, LDLCALC, TRIG, CHOLHDL, LDLDIRECT in the last 72 hours. Thyroid function studies: No results for input(s): TSH, T4TOTAL, T3FREE, THYROIDAB in the last 72 hours.  Invalid input(s): FREET3 Anemia work up: No results for input(s): VITAMINB12, FOLATE, FERRITIN, TIBC, IRON, RETICCTPCT in the last 72 hours. Sepsis Labs:  Recent Labs Lab 10/14/15 0302 10/15/15 0624 10/15/15 0710  WBC 2.4* 3.1* 3.0*   Microbiology No results found for this or any previous visit (from the past 240 hour(s)).   Medications:   . doxercalciferol  5 mcg Intravenous Q M,W,F-HD  . labetalol  200 mg Oral Daily  . pantoprazole (PROTONIX) IV   40 mg Intravenous Q12H  . sevelamer carbonate  2,400 mg Oral TID WC  . sodium chloride flush  3 mL Intravenous Q12H   Continuous Infusions:   Time spent: 25 min     Marinda Elk  [facility] Pager [phone]  *Please refer to [email or web address], password [email or web address] to get updated schedule on who will round on this patient, as hospitalists switch teams weekly. If 7PM-7AM, please contact night-coverage at [email or web address], password [email or web address] for any overnight needs.  10/15/2015, 12:08 PM

## 2015-10-16 DIAGNOSIS — Z992 Dependence on renal dialysis: Secondary | ICD-10-CM

## 2015-10-16 DIAGNOSIS — I482 Chronic atrial fibrillation: Secondary | ICD-10-CM

## 2015-10-16 DIAGNOSIS — I5023 Acute on chronic systolic (congestive) heart failure: Secondary | ICD-10-CM | POA: Diagnosis present

## 2015-10-16 DIAGNOSIS — R162 Hepatomegaly with splenomegaly, not elsewhere classified: Secondary | ICD-10-CM | POA: Diagnosis present

## 2015-10-16 DIAGNOSIS — I10 Essential (primary) hypertension: Secondary | ICD-10-CM

## 2015-10-16 DIAGNOSIS — N2581 Secondary hyperparathyroidism of renal origin: Secondary | ICD-10-CM | POA: Diagnosis present

## 2015-10-16 DIAGNOSIS — G609 Hereditary and idiopathic neuropathy, unspecified: Secondary | ICD-10-CM | POA: Diagnosis present

## 2015-10-16 DIAGNOSIS — I5022 Chronic systolic (congestive) heart failure: Secondary | ICD-10-CM

## 2015-10-16 DIAGNOSIS — I959 Hypotension, unspecified: Secondary | ICD-10-CM | POA: Diagnosis not present

## 2015-10-16 DIAGNOSIS — G8929 Other chronic pain: Secondary | ICD-10-CM | POA: Diagnosis present

## 2015-10-16 DIAGNOSIS — Z66 Do not resuscitate: Secondary | ICD-10-CM | POA: Diagnosis present

## 2015-10-16 DIAGNOSIS — D696 Thrombocytopenia, unspecified: Secondary | ICD-10-CM | POA: Diagnosis present

## 2015-10-16 DIAGNOSIS — I4510 Unspecified right bundle-branch block: Secondary | ICD-10-CM | POA: Diagnosis present

## 2015-10-16 DIAGNOSIS — G4730 Sleep apnea, unspecified: Secondary | ICD-10-CM | POA: Diagnosis present

## 2015-10-16 DIAGNOSIS — K761 Chronic passive congestion of liver: Secondary | ICD-10-CM | POA: Diagnosis present

## 2015-10-16 DIAGNOSIS — N186 End stage renal disease: Secondary | ICD-10-CM

## 2015-10-16 DIAGNOSIS — R51 Headache: Secondary | ICD-10-CM | POA: Diagnosis not present

## 2015-10-16 DIAGNOSIS — R188 Other ascites: Secondary | ICD-10-CM | POA: Diagnosis not present

## 2015-10-16 DIAGNOSIS — I132 Hypertensive heart and chronic kidney disease with heart failure and with stage 5 chronic kidney disease, or end stage renal disease: Secondary | ICD-10-CM | POA: Diagnosis present

## 2015-10-16 DIAGNOSIS — D631 Anemia in chronic kidney disease: Secondary | ICD-10-CM | POA: Diagnosis present

## 2015-10-16 DIAGNOSIS — K7460 Unspecified cirrhosis of liver: Secondary | ICD-10-CM | POA: Diagnosis present

## 2015-10-16 DIAGNOSIS — I120 Hypertensive chronic kidney disease with stage 5 chronic kidney disease or end stage renal disease: Secondary | ICD-10-CM | POA: Diagnosis not present

## 2015-10-16 DIAGNOSIS — R1013 Epigastric pain: Secondary | ICD-10-CM | POA: Diagnosis not present

## 2015-10-16 DIAGNOSIS — Z91018 Allergy to other foods: Secondary | ICD-10-CM | POA: Diagnosis not present

## 2015-10-16 DIAGNOSIS — B1920 Unspecified viral hepatitis C without hepatic coma: Secondary | ICD-10-CM | POA: Diagnosis present

## 2015-10-16 DIAGNOSIS — D509 Iron deficiency anemia, unspecified: Secondary | ICD-10-CM | POA: Diagnosis present

## 2015-10-16 DIAGNOSIS — I252 Old myocardial infarction: Secondary | ICD-10-CM | POA: Diagnosis not present

## 2015-10-16 DIAGNOSIS — B182 Chronic viral hepatitis C: Secondary | ICD-10-CM | POA: Diagnosis present

## 2015-10-16 DIAGNOSIS — N200 Calculus of kidney: Secondary | ICD-10-CM | POA: Diagnosis present

## 2015-10-16 DIAGNOSIS — M7981 Nontraumatic hematoma of soft tissue: Secondary | ICD-10-CM | POA: Diagnosis present

## 2015-10-16 DIAGNOSIS — Z87891 Personal history of nicotine dependence: Secondary | ICD-10-CM | POA: Diagnosis not present

## 2015-10-16 DIAGNOSIS — Z86711 Personal history of pulmonary embolism: Secondary | ICD-10-CM | POA: Diagnosis not present

## 2015-10-16 DIAGNOSIS — R109 Unspecified abdominal pain: Secondary | ICD-10-CM | POA: Diagnosis not present

## 2015-10-16 DIAGNOSIS — K219 Gastro-esophageal reflux disease without esophagitis: Secondary | ICD-10-CM | POA: Diagnosis present

## 2015-10-16 DIAGNOSIS — R140 Abdominal distension (gaseous): Secondary | ICD-10-CM | POA: Diagnosis not present

## 2015-10-16 DIAGNOSIS — I509 Heart failure, unspecified: Secondary | ICD-10-CM | POA: Diagnosis not present

## 2015-10-16 DIAGNOSIS — E8889 Other specified metabolic disorders: Secondary | ICD-10-CM | POA: Diagnosis present

## 2015-10-16 DIAGNOSIS — E559 Vitamin D deficiency, unspecified: Secondary | ICD-10-CM | POA: Diagnosis present

## 2015-10-16 DIAGNOSIS — E871 Hypo-osmolality and hyponatremia: Secondary | ICD-10-CM | POA: Diagnosis not present

## 2015-10-16 DIAGNOSIS — Z7901 Long term (current) use of anticoagulants: Secondary | ICD-10-CM | POA: Diagnosis not present

## 2015-10-16 DIAGNOSIS — I739 Peripheral vascular disease, unspecified: Secondary | ICD-10-CM | POA: Diagnosis present

## 2015-10-16 DIAGNOSIS — I2510 Atherosclerotic heart disease of native coronary artery without angina pectoris: Secondary | ICD-10-CM | POA: Diagnosis present

## 2015-10-16 MED ORDER — POLYETHYLENE GLYCOL 3350 17 G PO PACK
17.0000 g | PACK | Freq: Two times a day (BID) | ORAL | Status: AC
  Administered 2015-10-16 – 2015-10-17 (×3): 17 g via ORAL
  Filled 2015-10-16 (×3): qty 1

## 2015-10-16 NOTE — Progress Notes (Signed)
[facility] Progress Note  Assessment/Plan: 1. Abdominal pain/distention: no sig ascites on CT.  His JVD is chronic and not reflective of vol status.  Lungs are clear, legs are free of edema. Abd is distended, a lot of which is liver.  The liver is massive and pulsatile but not tender, not sure there's much we can do about this w his known chronic poor heart function.  He is only 2kg over dry wt today and doesn't want extra HD today. Chron issue, he says he's going to get endoscopy soon as OP. Plan next HD Wed.  2. End-stage renal disease: MWF at [facility]. Had HD today. Tolerated well. K+5.6. 3. Hypertension: BP slightly soft-probably from PRN morphine . On labetolol 200 mg PO daily.  Net UF today 3500. Post wt. 92.4  4. Metabolic bone disease: Sevelamer restarted with meals 3 times a day before meals, will restart [name]. 5. 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  6. Nutrition: Albumin 3.3. Renal diet, fld restrictions.   Rita H. Brown NP-C 10/16/2015, 10:54 AM  [facility] [phone]  Pt seen, examined and agree w A/P as above. OK for dc from renal stanpdoint.  [name] MD [facility] pager [phone]    cell [phone] 10/16/2015, 10:54 AM     Subjective: "My pain in controlled with morphine". No C/O pain/discomfort. Seen at end of HD. Tolerated procedure well.     Objective Filed Vitals:   10/15/15 2041 10/16/15 0500 10/16/15 0651 10/16/15 1002  BP: 114/81  112/85 106/70  Pulse: 83  69 78  Temp: 98.5 F (36.9 C)  98.2 F (36.8 C) 98.6 F (37 C)  TempSrc: Oral  Oral Oral  Resp: 18  18 18   Weight: 91.4 kg (201 lb 8 oz) 95 kg (209 lb 7 oz)    SpO2: 94%  98% 96%   Physical Exam General: Well nourished male in NAD Heart: Irregular, S1, S2, Afib on monitor.  Lungs: Bilateral breath sounds CTA Abdomen: abdomen soft, nontender at present.Nondistended.  Extremities:  No LE edema Dialysis Access: LUA AVF + bruit  Dialysis Orders: SW. [facility] Monday/Wednesday/Friday, 4 hours 15 minutes, 200NR dialyzer, blood flow 500, dialysate flow 800, estimated dry weight 91 kg, 2K/2 calcium, no UF profile, no sodium profile. Left brachiobasilic fistula. [name] 5 g IV every Monday Wednesday Friday. No heparin.   Additional Objective Labs: Basic Metabolic Panel:  Recent Labs Lab 10/14/15 0302 10/15/15 0624 10/15/15 0710  NA 136 134* 133*  K 5.0 5.6* 5.6*  CL 96* 93* 95*  CO2 29 26 24   GLUCOSE 88 58* 91  BUN 35* 45* 46*  CREATININE 7.34* 9.07* 9.00*  CALCIUM 9.7 9.2 9.2  PHOS  --   --  6.1*   Liver Function Tests:  Recent Labs Lab 10/14/15 0302 10/15/15 0710  AST 34  --   ALT 16*  --   ALKPHOS 174*  --   BILITOT 1.5*  --   PROT 8.4*  --   ALBUMIN 3.5 3.5    Recent Labs Lab 10/14/15 0302  LIPASE 71*   CBC:  Recent Labs Lab 10/14/15 0302 10/15/15 0624 10/15/15 0710  WBC 2.4* 3.1* 3.0*  NEUTROABS 1.2*  --   --   HGB 11.6* 11.6* 11.4*  HCT 34.6* 34.7* 33.7*  MCV 87.6 86.5 86.2  PLT 83* 79*  --    Blood Culture    Component Value Date/Time   SDES PERITONEAL FLUID 09/02/2014  1344   SPECREQUEST NONE 09/02/2014 1344   CULT  09/02/2014 1344    NO GROWTH 3 DAYS Performed at [facility]    REPTSTATUS 09/05/2014 FINAL 09/02/2014 1344    Cardiac Enzymes: No results for input(s): CKTOTAL, CKMB, CKMBINDEX, TROPONINI in the last 168 hours. CBG: No results for input(s): GLUCAP in the last 168 hours. Iron Studies: No results for input(s): IRON, TIBC, TRANSFERRIN, FERRITIN in the last 72 hours. @ Studies/Results: No results found. Medications:   . doxercalciferol  5 mcg Intravenous Q M,W,F-HD  . labetalol  200 mg Oral Daily  . pantoprazole  40 mg Oral BID  . sevelamer carbonate  2,400 mg Oral TID WC  . sodium chloride flush  3 mL Intravenous Q12H

## 2015-10-16 NOTE — Progress Notes (Deleted)
[name] Progress Note  Assessment/Plan: 1. Abdominal pain/distention: Per Primary. Patient states he is schedule for endoscopy but do not see GI note.  2. End-stage renal disease: MWF at [facility]. Had HD Yesterday, will have HD again tomorrow on schedule.  3. Hypertension: BP stable . On labetolol 200 mg PO daily.HD 02/06 Net UF today 3500. Post wt. 92.4 in hemodialysis unit. Wt 95 today. Will have HD on schedule tomorrow. Patient believe that he is not getting adequate out patient treatment. Will attempt UFG 4-4.5 liters tomorrow.  4. Metabolic bone disease: Ca 9.2 C Ca 9.6 Phos 6.1. Continue binders.  5. 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  6. Nutrition: Albumin 3.3. Renal diet, fld restrictions 7. Chronic R Heart Failure: Does not appear to be volume overloaded at present. Has chronic JVD, no edema. 8. Chronic Hepatitis C with cirrhosis: per primary  Trevor [name] H. Gwendolen Hewlett NP-C 10/16/2015, 10:50 AM  [facility] [phone]  Subjective: "I'm doing OK but I still hurt". Lying flat in bed, C/O 10/10 abdominal pain, pointing to area below diaphragm. PRN pain meds. States he has gas, tenderness upper quadrants.    Objective Filed Vitals:   10/15/15 2041 10/16/15 0500 10/16/15 0651 10/16/15 1002  BP: 114/81  112/85 106/70  Pulse: 83  69 78  Temp: 98.5 F (36.9 C)  98.2 F (36.8 C) 98.6 F (37 C)  TempSrc: Oral  Oral Oral  Resp: Weight: 91.4 kg (201 lb 8 oz) 95 kg (209 lb 7 oz)    SpO2: 94%  98% 96%   General: Well nourished male in NAD Heart: Irregular, S1, S2, Afib on monitor. Chronic JVD.  Lungs: Bilateral breath sounds CTA Abdomen: abdomen soft, slightly tender, firm. Liver palpated below ribs 5 cm.   Extremities: No LE edema Dialysis Access: LUA AVF + bruit  Dialysis Orders: SW. [facility] Monday/Wednesday/Friday, 4 hours 15 minutes, 200NR dialyzer, blood  flow 500, dialysate flow 800, estimated dry weight 91 kg, 2K/2 calcium, no UF profile, no sodium profile. Left brachiobasilic fistula. [name] 5 g IV every Monday Wednesday Friday. No heparin. Additional Objective Labs: Basic Metabolic Panel:  Recent Labs Lab 10/14/15 0302 10/15/15 0624 10/15/15 0710  NA 136 134* 133*  K 5.0 5.6* 5.6*  CL 96* 93* 95*  CO2 GLUCOSE 88 58* 91  BUN 35* 45* 46*  CREATININE 7.34* 9.07* 9.00*  CALCIUM 9.7 9.2 9.2  PHOS  --   --  6.1*   Liver Function Tests:  Recent Labs Lab 10/14/15 0302 10/15/15 0710  AST 34  --   ALT 16*  --   ALKPHOS 174*  --   BILITOT 1.5*  --   PROT 8.4*  --   ALBUMIN 3.5 3.5    Recent Labs Lab 10/14/15 0302  LIPASE 71*   CBC:  Recent Labs Lab 10/14/15 0302 10/15/15 0624 10/15/15 0710  WBC 2.4* 3.1* 3.0*  NEUTROABS 1.2*  --   --   HGB 11.6* 11.6* 11.4*  HCT 34.6* 34.7* 33.7*  MCV 87.6 86.5 86.2  PLT 83* 79*  --    Blood Culture    Component Value Date/Time   SDES PERITONEAL FLUID 09/02/2014 1344   SPECREQUEST NONE 09/02/2014 1344   CULT  09/02/2014 1344    NO GROWTH 3 DAYS Performed at [facility]    REPTSTATUS 09/05/2014 FINAL 09/02/2014 1344    Cardiac Enzymes: No results for input(s):  CKTOTAL, CKMB, CKMBINDEX, TROPONINI in the last 168 hours. CBG: No results for input(s): GLUCAP in the last 168 hours. Iron Studies: No results for input(s): IRON, TIBC, TRANSFERRIN, FERRITIN in the last 72 hours. @lablastinr3 @ Studies/Results: No results found. Medications:   . doxercalciferol  5 mcg Intravenous Q M,W,F-HD  . labetalol  200 mg Oral Daily  . pantoprazole  40 mg Oral BID  . sevelamer carbonate  2,400 mg Oral TID WC  . sodium chloride flush  3 mL Intravenous Q12H

## 2015-10-16 NOTE — Progress Notes (Signed)
[facility] PROGRESS NOTE    Progress Note   Trevor [name] WUJ:[id number] DOB: 03/04/1951 DOA: 10/14/2015 PCP: [name], MD   Brief Narrative:   Trevor [name] is an 65 y.o. male past medical history of end-stage renal disease, liver disease and biventricular failure who presents to the emergency room with uncontrollable abdominal pain with intractable nausea and vomiting.  Assessment/Plan:   Intractable abdominal pain: CT scan of the abdomen and pelvis show no acute findings, but did show some hepatomegaly and splenomegaly with mild fluid overload He has massive JVD positive, hepatojugular reflex and a history of right-sided heart failure, which all this volume overload could be contributing to his abdominal pain causing severe hepatic congestion. Consult cards.  ANEMIA, IRON DEFICIENCY Due to renal disease further management per renal.  Essential hypertension Continue to continue current management.  ESRD on hemodialysis [facility]) Continue dialysis Monday Wednesday and Fridays.  Pt does not want o due HD.  Chronic Thrombocytopenia (HCC) At baseline monitor.  Chronic atrial fibrillation (HCC): Great control continue Coumadin per pharmacy.  Hepatic cirrhosis (HCC) Follow-up with Gastoenterology.    DVT Prophylaxis - Lovenox ordered.  Family Communication: none Disposition Plan: Home 2 days Code Status:     Code Status Orders        Start     Ordered   10/14/15 0749  Full code   Continuous     10/14/15 0751    Code Status History    Date Active Date Inactive Code Status Order ID Comments User Context   09/05/2014  5:02 PM 09/12/2014  6:43 PM Full Code [id number]  Trevor Moan, MD Inpatient   08/30/2014  9:45 AM 09/05/2014  5:02 PM Full Code [id number]  Trevor Shipper, MD ED   03/21/2014  6:30 PM 03/23/2014  4:30 PM Full Code [id number]  Trevor Filler, MD Inpatient   09/01/2013  7:46 AM 09/06/2013  8:25 PM Full Code [id number]  Trevor Siren, MD Inpatient   06/28/2013  9:13 PM 07/04/2013  6:19 PM Full Code [id number]  Trevor Harp, MD Inpatient   05/03/2013  1:49 AM 05/07/2013  5:12 PM Full Code [id number]  Trevor Stable, MD Inpatient   04/28/2013  6:35 PM 04/30/2013  2:06 PM Full Code [id number]  Trevor Dallas, MD Inpatient    Advance Directive Documentation        Most Recent Value   Type of Advance Directive  Living will, Healthcare Power of Attorney   Pre-existing out of facility DNR order (yellow form or pink MOST form)     "MOST" Form in Place?          IV Access:    Peripheral IV   Procedures and diagnostic studies:   No results found.   Medical Consultants:    None.  Anti-Infectives:   Anti-infectives    None      Subjective:    [name] continues to complain of abdominal pain.  Objective:    Filed Vitals:   10/15/15 2041 10/16/15 0500 10/16/15 0651 10/16/15 1002  BP: 114/81  112/85 106/70  Pulse: 83  69 78  Temp: 98.5 F (36.9 C)  98.2 F (36.8 C) 98.6 F (37 C)  TempSrc: Oral  Oral Oral  Resp: Weight: 91.4 kg (201 lb 8 oz) 95 kg (209 lb 7 oz)    SpO2: 94%  98% 96%    Intake/Output Summary (Last 24 hours) at 10/16/15 1133 Last data filed at 10/16/15 0900  Gross per 24 hour  Intake    960 ml  Output      0 ml  Net    960 ml   Filed Weights   10/15/15 1120 10/15/15 2041 10/16/15 0500  Weight: 91.4 kg (201 lb 8 oz) 91.4 kg (201 lb 8 oz) 95 kg (209 lb 7 oz)    Exam: Gen:  NAD Cardiovascular:  RRR,JVD to the ear lobe,  Chest and lungs:   CTAB Abdomen:  Tense and distended abdomen no rebound or guarding. Extremities:  No C/E/C   Data Reviewed:    Labs: Basic Metabolic Panel:  Recent Labs Lab 10/14/15 0302 10/15/15 0624 10/15/15 0710  NA 136 134* 133*  K 5.0 5.6* 5.6*  CL 96* 93* 95*  CO2 29 26 24   GLUCOSE 88 58* 91  BUN 35* 45* 46*  CREATININE 7.34* 9.07* 9.00*  CALCIUM 9.7 9.2 9.2  PHOS  --   --  6.1*   GFR CrCl cannot be calculated (Unknown ideal  weight.). Liver Function Tests:  Recent Labs Lab 10/14/15 0302 10/15/15 0710  AST 34  --   ALT 16*  --   ALKPHOS 174*  --   BILITOT 1.5*  --   PROT 8.4*  --   ALBUMIN 3.5 3.5    Recent Labs Lab 10/14/15 0302  LIPASE 71*   No results for input(s): AMMONIA in the last 168 hours. Coagulation profile No results for input(s): INR, PROTIME in the last 168 hours.  CBC:  Recent Labs Lab 10/14/15 0302 10/15/15 0624 10/15/15 0710  WBC 2.4* 3.1* 3.0*  NEUTROABS 1.2*  --   --   HGB 11.6* 11.6* 11.4*  HCT 34.6* 34.7* 33.7*  MCV 87.6 86.5 86.2  PLT 83* 79*  --    Cardiac Enzymes: No results for input(s): CKTOTAL, CKMB, CKMBINDEX, TROPONINI in the last 168 hours. BNP (last 3 results) No results for input(s): PROBNP in the last 8760 hours. CBG: No results for input(s): GLUCAP in the last 168 hours. D-Dimer: No results for input(s): DDIMER in the last 72 hours. Hgb A1c: No results for input(s): HGBA1C in the last 72 hours. Lipid Profile: No results for input(s): CHOL, HDL, LDLCALC, TRIG, CHOLHDL, LDLDIRECT in the last 72 hours. Thyroid function studies: No results for input(s): TSH, T4TOTAL, T3FREE, THYROIDAB in the last 72 hours.  Invalid input(s): FREET3 Anemia work up: No results for input(s): VITAMINB12, FOLATE, FERRITIN, TIBC, IRON, RETICCTPCT in the last 72 hours. Sepsis Labs:  Recent Labs Lab 10/14/15 0302 10/15/15 0624 10/15/15 0710  WBC 2.4* 3.1* 3.0*   Microbiology No results found for this or any previous visit (from the past 240 hour(s)).   Medications:   . doxercalciferol  5 mcg Intravenous Q M,W,F-HD  . labetalol  200 mg Oral Daily  . pantoprazole  40 mg Oral BID  . sevelamer carbonate  2,400 mg Oral TID WC  . sodium chloride flush  3 mL Intravenous Q12H   Continuous Infusions:   Time spent: 25 min     Marinda Elk  [facility] Pager [phone]  *Please refer to [email or web address], password [email or web address] to get updated schedule on who  will round on this patient, as hospitalists switch teams weekly. If 7PM-7AM, please contact night-coverage at [email or web address], password [email or web address] for any overnight needs.  10/16/2015, 11:33 AM

## 2015-10-16 NOTE — Progress Notes (Addendum)
Pt has refused cpap at this time.  Rt will monitor. 

## 2015-10-16 NOTE — Consult Note (Signed)
Cardiologist:  Radford Pax Reason for Consult: Heart Failure Referring Physician: [name] is an 65 y.o. male.  HPI:    Trevor [name] is an 65 y.o. Male with a history of HTN, ESRD, afib, systemic anticoagulation and syncope felt secondary to afib with RVR. He underwent DCCV to NSR on 08/29/13. He then developed a large hematoma on his back and fevers and cough and presented to the ER on 09/01/13. He was taken off Coumadin due to large hematoma significant anemia and was then on ASA. He was later seen at Urgent care and the  hematoma had completely resolved.  His PCP said it was ok to restart coumadin. He refused to go back on coumadin at his last OV. His last echo was 09/04/14 and his EF was 25-30% with diffuse hypokinesis.  Akinesis of the anteroseptal myocardium.  Mild AI and MR.  LA, RV severely dilated and RA massively dilated.  Peak PA pressure 29mHg.  He has not been seen by Dr. TRadford Paxsince 09/13/14 for a hospital consult.   He was admitted now for severe abd pain.  CT scan showed nothing to explain pain but he dos have volume overload.  The patient says he has been having issues with abd pain since his umbilical hernia surgery in 03/2014.  He also reports abd distention.  He denies LEE, orthopnea, PND, CP.  He usually works out at [employer] days /week doing yoga, walking and some weight machines.  He has chronic Afib and is not interested in coumadin because of the previous hematoma.    Past Medical History  Diagnosis Date  . Anemia   . AVF (arteriovenous fistula) (HCC)     Left  . Secondary hyperparathyroidism ([place]   . Hypovitaminosis D   . Hypertensive urgency     H/o  . CHF (congestive heart failure) ([place]   . Exertional shortness of breath     "related to infection in my lungs right now" (05/03/2013)  . History of gout     "before I started doing the dialysis" (05/03/2013)  . Sleep apnea   . GERD (gastroesophageal reflux disease)   . Syncope     felt secondary to  residual anesthesia the day before - 2D echo unremarkable  . Pulmonary embolism ([place]     with right DVT secondary to recent surgery  . Atrial fibrillation ([place]     not on coumadin due to large spontaneous hematoma on back and anemia  . Myocardial infarction ([place] 90's  . Coronary artery disease   . Dysrhythmia     afib  . Peripheral vascular disease ([place]     dvt leg 12/14  . Hypertension   . ESRD (end stage renal disease) on dialysis ([place]     [facility] mon/wed/fri  . Atrial flutter ([place] 04/27/2013  . S/P repair of ventral hernia 03/21/2014  . Cirrhosis of liver ([place] 08-2014  . Hereditary and idiopathic peripheral neuropathy 08/29/2015  . Hepatitis C     Past Surgical History  Procedure Laterality Date  . Av fistula placement Left     Dr. FSu Grand "I've had 2 on the left' (05/03/2013)  . Av fistula placement Right ~ 2011  . Knee arthroscopy Left   . Avgg removal Right 05/04/2013    Procedure: REMOVAL OF ARTERIOVENOUS Fistula Right Arm;  Surgeon: CElam Dutch MD;  [place]  Service: Vascular;  Laterality: Right;  . Insertion of dialysis catheter Right 05/04/2013  Procedure: INSERTION OF DIALYSIS CATHETER;  Surgeon: Elam Dutch, MD;  Location: [place];  Service: Vascular;  Laterality: Right;  . Colonoscopy      Hx: of  . Bascilic vein transposition Left 06/27/2013    Procedure: BASCILIC VEIN TRANSPOSITION;  Surgeon: Elam Dutch, MD;  Location: [place];  Service: Vascular;  Laterality: Left;  . Cardioversion N/A 08/29/2013    Procedure: CARDIOVERSION;  Surgeon: Sueanne Margarita, MD;  Location: [place];  Service: Cardiovascular;  Laterality: N/A;  . Removal of a dialysis catheter  2/15  . Hernia repair  1/61/09    Umbilical hernia-Dr. Rosendo Gros  . Umbilical hernia repair N/A 03/21/2014    Procedure: LAPAROSCOPIC UMBILICAL HERNIA REPAIR WITH MESH;  Surgeon: Ralene Ok, MD;  Location: [place];  Service: General;  Laterality: N/A;  . Insertion of mesh N/A  03/21/2014    Procedure: INSERTION OF MESH;  Surgeon: Ralene Ok, MD;  Location: [place];  Service: General;  Laterality: N/A;  . Venogram Left 05/26/2013    Procedure: VENOGRAM;  Surgeon: Conrad Unity, MD;  Location: [facility] CATH LAB;  Service: Cardiovascular;  Laterality: Left;    Family History  Problem Relation Age of Onset  . Hypertension Father   . Kidney disease Father   . Allergies Father   . Deep vein thrombosis Sister   . Pulmonary embolism Sister   . Diabetes Paternal Grandmother     Social History:  reports that he quit smoking about 43 years ago. His smoking use included Cigarettes. He has a .125 pack-year smoking history. He has never used smokeless tobacco. He reports that he does not drink alcohol or use illicit drugs.  Allergies:  Allergies  Allergen Reactions  . Pork-Derived Products Nausea And Vomiting    Culture    Medications:  Scheduled Meds: . doxercalciferol  5 mcg Intravenous Q M,W,F-HD  . labetalol  200 mg Oral Daily  . pantoprazole  40 mg Oral BID  . polyethylene glycol  17 g Oral BID  . sevelamer carbonate  2,400 mg Oral TID WC  . sodium chloride flush  3 mL Intravenous Q12H   Continuous Infusions:  PRN Meds:.sodium chloride, diphenhydrAMINE, morphine injection, ondansetron **OR** ondansetron (ZOFRAN) IV, sodium chloride flush, sorbitol, traZODone   Results for orders placed or performed during the hospital encounter of 10/14/15 (from the past 48 hour(s))  Basic metabolic panel     Status: Abnormal   Collection Time: 10/15/15  6:24 AM  Result Value Ref Range   Sodium 134 (L) 135 - 145 mmol/L   Potassium 5.6 (H) 3.5 - 5.1 mmol/L   Chloride 93 (L) 101 - 111 mmol/L   CO2 26 22 - 32 mmol/L   Glucose, Bld 58 (L) 65 - 99 mg/dL   BUN 45 (H) 6 - 20 mg/dL   Creatinine, Ser 9.07 (H) 0.61 - 1.24 mg/dL   Calcium 9.2 8.9 - 10.3 mg/dL   GFR calc non Af Amer 5 (L) >60 mL/min   GFR calc Af Amer 6 (L) >60 mL/min    Comment: (NOTE) The eGFR has been  calculated using the CKD EPI equation. This calculation has not been validated in all clinical situations. eGFR's persistently <60 mL/min signify possible Chronic Kidney Disease.    Anion gap 15 5 - 15  CBC     Status: Abnormal   Collection Time: 10/15/15  6:24 AM  Result Value Ref Range   WBC 3.1 (L) 4.0 - 10.5 K/uL   RBC 4.01 (  L) 4.22 - 5.81 MIL/uL   Hemoglobin 11.6 (L) 13.0 - 17.0 g/dL   HCT 34.7 (L) 39.0 - 52.0 %   MCV 86.5 78.0 - 100.0 fL   MCH 28.9 26.0 - 34.0 pg   MCHC 33.4 30.0 - 36.0 g/dL   RDW 17.5 (H) 11.5 - 15.5 %   Platelets 79 (L) 150 - 400 K/uL    Comment: PLATELET COUNT CONFIRMED BY SMEAR  Renal function panel     Status: Abnormal   Collection Time: 10/15/15  7:10 AM  Result Value Ref Range   Sodium 133 (L) 135 - 145 mmol/L   Potassium 5.6 (H) 3.5 - 5.1 mmol/L   Chloride 95 (L) 101 - 111 mmol/L   CO2 24 22 - 32 mmol/L   Glucose, Bld 91 65 - 99 mg/dL   BUN 46 (H) 6 - 20 mg/dL   Creatinine, Ser 9.00 (H) 0.61 - 1.24 mg/dL   Calcium 9.2 8.9 - 10.3 mg/dL   Phosphorus 6.1 (H) 2.5 - 4.6 mg/dL   Albumin 3.5 3.5 - 5.0 g/dL   GFR calc non Af Amer 6 (L) >60 mL/min   GFR calc Af Amer 6 (L) >60 mL/min    Comment: (NOTE) The eGFR has been calculated using the CKD EPI equation. This calculation has not been validated in all clinical situations. eGFR's persistently <60 mL/min signify possible Chronic Kidney Disease.    Anion gap 14 5 - 15  CBC     Status: Abnormal   Collection Time: 10/15/15  7:10 AM  Result Value Ref Range   WBC 3.0 (L) 4.0 - 10.5 K/uL   RBC 3.91 (L) 4.22 - 5.81 MIL/uL   Hemoglobin 11.4 (L) 13.0 - 17.0 g/dL   HCT 33.7 (L) 39.0 - 52.0 %   MCV 86.2 78.0 - 100.0 fL   MCH 29.2 26.0 - 34.0 pg   MCHC 33.8 30.0 - 36.0 g/dL   RDW 17.5 (H) 11.5 - 15.5 %    No results found.  Review of Systems  Constitutional: Negative for fever and diaphoresis.  HENT: Negative for congestion and sore throat.   Respiratory: Negative for cough and shortness of breath.    Cardiovascular: Negative for chest pain, palpitations, orthopnea, claudication and leg swelling.  Gastrointestinal: Positive for abdominal pain. Negative for nausea, vomiting, blood in stool and melena.  Musculoskeletal: Negative for myalgias.  Neurological: Negative for dizziness and weakness.  All other systems reviewed and are negative.  Blood pressure 106/70, pulse 78, temperature 98.6 F (37 C), temperature source Oral, resp. rate 18, weight 209 lb 7 oz (95 kg), SpO2 96 %. Physical Exam Well nourished, well developed, in no acute distress HEENT: Pupils are equal round react to light accommodation extraocular movements are intact.  Neck: elevated JVDNo cervical lymphadenopathy. Cardiac: Regular rate and rhythm without murmurs rubs or gallops. Lungs:  clear to auscultation bilaterally, no wheezing, rhonchi or rales Abd: Firm, distended, dull to percussion, nontender, positive bowel sounds Ext: no lower extremity edema.  2+ radial pulses. Skin: warm and dry Neuro:  Grossly normal   Assessment/Plan: Principal Problem:   Intractable abdominal pain Active Problems:   ANEMIA, IRON DEFICIENCY   Essential hypertension   ESRD on hemodialysis (HCC)   GERD (gastroesophageal reflux disease)   Thrombocytopenia (HCC)   Chronic atrial fibrillation (HCC)   RVF (right ventricular failure) (HCC)   Abdominal distension   Hepatic cirrhosis ([place])  He is  volume overloaded with severely distended JVD. He has right heart  failure, severe TR.  Volume is managed through HD and he is currently net -2.8L.  Weight 203.  I do not know if repeating his echo will yield anything new to help.  He is not overly symptomatic.  BP controlled with mild hypotension.     Trevor [name], [place] 10/16/2015, 1:10 PM   Patient seen and examined and history reviewed. Agree with above findings and plan. 65 yo BM with history of ESRD, hepatitis C, chronic AFib, and biventricular CHF with EF 25-30%. Admitted with abdominal  pain. This has been present since he had an umbilical hernia repair. Does note intermittent bloating but not related to increased weight or before dialysis. I think he is mildly volume overloaded but weight is only 2 kg above dry weight. No ascites on CT. No peripheral edema. He does have chronic JVD and a very large liver. Agree with dialysis recommendations per Dr. Jonnie Finner. Little else to add from a cardiac standpoint. He is still surprisingly active. We will arrange a follow up with Dr. Radford Pax as outpatient.   Trevor [name], [place] 10/16/2015 3:06 PM

## 2015-10-17 DIAGNOSIS — I509 Heart failure, unspecified: Secondary | ICD-10-CM

## 2015-10-17 LAB — RENAL FUNCTION PANEL
Albumin: 3.3 g/dL — ABNORMAL LOW (ref 3.5–5.0)
Anion gap: 13 (ref 5–15)
BUN: 44 mg/dL — ABNORMAL HIGH (ref 6–20)
CO2: 25 mmol/L (ref 22–32)
Calcium: 9.3 mg/dL (ref 8.9–10.3)
Chloride: 94 mmol/L — ABNORMAL LOW (ref 101–111)
Creatinine, Ser: 9.46 mg/dL — ABNORMAL HIGH (ref 0.61–1.24)
GFR calc Af Amer: 6 mL/min — ABNORMAL LOW (ref >60)
GFR calc non Af Amer: 5 mL/min — ABNORMAL LOW (ref >60)
Glucose, Bld: 89 mg/dL (ref 65–99)
Phosphorus: 5.1 mg/dL — ABNORMAL HIGH (ref 2.5–4.6)
Potassium: 4.5 mmol/L (ref 3.5–5.1)
Sodium: 132 mmol/L — ABNORMAL LOW (ref 135–145)

## 2015-10-17 LAB — CBC
HCT: 32.5 % — ABNORMAL LOW (ref 39.0–52.0)
Hemoglobin: 10.6 g/dL — ABNORMAL LOW (ref 13.0–17.0)
MCH: 28 pg (ref 26.0–34.0)
MCHC: 32.6 g/dL (ref 30.0–36.0)
MCV: 85.8 fL (ref 78.0–100.0)
Platelets: 79 10*3/uL — ABNORMAL LOW (ref 150–400)
RBC: 3.79 MIL/uL — ABNORMAL LOW (ref 4.22–5.81)
RDW: 17 % — ABNORMAL HIGH (ref 11.5–15.5)
WBC: 2.2 10*3/uL — ABNORMAL LOW (ref 4.0–10.5)

## 2015-10-17 MED ORDER — DOXERCALCIFEROL 4 MCG/2ML IV SOLN
INTRAVENOUS | Status: AC
  Filled 2015-10-17: qty 4

## 2015-10-17 MED ORDER — LIDOCAINE-PRILOCAINE 2.5-2.5 % EX CREA: 1.0000 "application " | TOPICAL_CREAM | CUTANEOUS | Status: DC | PRN

## 2015-10-17 MED ORDER — RENA-VITE PO TABS
1.0000 | ORAL_TABLET | Freq: Every day | ORAL | Status: DC
  Administered 2015-10-17 – 2015-10-18 (×2): 1 via ORAL
  Filled 2015-10-17 (×2): qty 1

## 2015-10-17 MED ORDER — PENTAFLUOROPROP-TETRAFLUOROETH EX AERO: 1.0000 "application " | INHALATION_SPRAY | CUTANEOUS | Status: DC | PRN

## 2015-10-17 MED ORDER — LIDOCAINE HCL (PF) 1 % IJ SOLN: 5.0000 mL | INTRAMUSCULAR | Status: DC | PRN

## 2015-10-17 MED ORDER — NEPRO/CARBSTEADY PO LIQD
237.0000 mL | Freq: Two times a day (BID) | ORAL | Status: DC
  Administered 2015-10-18 (×2): 237 mL via ORAL

## 2015-10-17 MED ORDER — ALTEPLASE 2 MG IJ SOLR: 2.0000 mg | Freq: Once | INTRAMUSCULAR | Status: DC | PRN

## 2015-10-17 MED ORDER — SODIUM CHLORIDE 0.9 % IV SOLN: 100.0000 mL | INTRAVENOUS | Status: DC | PRN

## 2015-10-17 MED ORDER — MORPHINE SULFATE (PF) 2 MG/ML IV SOLN
INTRAVENOUS | Status: AC
  Filled 2015-10-17: qty 1

## 2015-10-17 NOTE — Clinical Documentation Improvement (Signed)
Cardiology Hospitalist Internal Medicine Nephrology/Renal  Can the diagnosis of CHF be further specified in progress notes and discharge summary?   Chronic diastolic CHF  Chronic systolic CHF  Chronic combined diastolic and systolic CHF  Other  Clinically Undetermined   Document any associated diagnoses/conditions   Supporting Information: EDP note 65 y.o. male with a PMHx of CHF, GERD, A-Fib, ESRD, and Hepatitis C who presents to the Emergency Department complaining of constant, ongoing abdominal pain onset yesterday evening after dialysis treatment. Pt also reports abdominal bloating and cough. H&P #8. Chronic biventricular heart failure predominantly right. Echo in December 2015 shows an EF of 25-30% moderate LVH systolic function severely reduced diffuse hypokinesis. Compensated -Continue beta blocker -Daily weights 2/7 Nephro progress note: The liver is massive and pulsatile but not tender, not sure there's much we can do about this w his known chronic poor heart function. 2/7 Hospitalist progress note He has massive JVD positive, hepatojugular reflex and a history of right-sided heart failure, which all this volume overload could be contributing to his abdominal pain causing severe hepatic congestion. Consult cards. 2/7 Cards consult: He is volume overloaded with severely distended JVD. He has right heart failure, severe TR. Volume is managed through HD and he is currently net -2.8L. Weight 203. I do not know if repeating his echo will yield anything new to help. He is not overly symptomatic. BP controlled with mild hypotension.   Patient seen and examined and history reviewed. Agree with above findings and plan. 65 yo BM with history of ESRD, hepatitis C, chronic AFib, and biventricular CHF with EF 25-30%  2/5 CT abdomen Pelvis w/contrast FINDINGS: Lower chest and abdominal wall: Chronic cardiomegaly with marked atrial enlargement.  Treatements Daily  weights Monitor I&O Cards Consult  Please exercise your independent, professional judgment when responding. A specific answer is not anticipated or expected.   Thank You,  Harless Litten Health Information Management [place] [phone]

## 2015-10-17 NOTE — Progress Notes (Signed)
[facility] Progress Note  Assessment/Plan: 1. Abdominal pain/distention: no sig ascites on CT. Marked hepatomegaly but not sure if this is causing him symptoms. Known severe CM which is likely cause of large liver. Seen by cardiology, vol control with HD main priority. Other w/u per primary.  2. End-stage renal disease: MWF at [facility]. For HD today. Check renal profile.  3. Hypertension: BP slightly soft-probably from PRN morphine . On labetolol 200 mg PO daily.Last wt 94.8 attempt UFG 4-5 liters today. Believe he is not as compliant with fld restrictions as he says he is.  4. Metabolic bone disease: Sevelamer restarted with meals 3 times a day before meals, will restart [name]. Phos 6.1 Ca 9.2 C Ca 9.76 10/15/15. Recheck labs today.  5. 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 Check HGB today.  6. Nutrition: Albumin 3.3. Renal diet, reititerated fld restrictions to plt.  7. Afib: per primary. Cardiology consulted. Pt refuses coumadin.   Rita H. Brown NP-C 10/17/2015, 10:12 AM  [facility] [phone]  Pt seen, examined, agree w assess/plan as above with additions as indicated.  [name] MD [facility] pager [phone]    cell [phone] 10/17/2015, 11:56 AM     Subjective:  "I'm still having some abdominal pain.    Objective Filed Vitals:   10/16/15 1740 10/16/15 2034 10/17/15 0518 10/17/15 0937  BP: 116/74 106/63 124/88 118/90  Pulse: 80 77 82 83  Temp: 98.4 F (36.9 C) 98.1 F (36.7 C) 98.6 F (37 C) 98.4 F (36.9 C)  TempSrc: Oral   Oral  Resp: 18 21 24 20   Weight:  94.8 kg (208 lb 15.9 oz)    SpO2: 98% 96% 95% 96%   Physical Exam General: Well nourished male in NAD Heart: Irregular, S1, S2, Afib on monitor. Chronic JVD.  Lungs: Bilateral breath sounds CTA Abdomen: abdomen firm, liver prominent 4-5 cm below ribs.  Extremities: No LE edema Dialysis  Access: LUA AVF + bruit  Dialysis Orders: [facility] Monday/Wednesday/Friday, 4 hours 15 minutes, 200NR dialyzer, blood flow 500, dialysate flow 800, estimated dry weight 91 kg, 2K/2 calcium, no UF profile, no sodium profile. Left brachiobasilic fistula. [name] 5 g IV every Monday Wednesday Friday. No heparin.  Additional Objective Labs: Basic Metabolic Panel:  Recent Labs Lab 10/14/15 0302 10/15/15 0624 10/15/15 0710  NA 136 134* 133*  K 5.0 5.6* 5.6*  CL 96* 93* 95*  CO2 29 26 24   GLUCOSE 88 58* 91  BUN 35* 45* 46*  CREATININE 7.34* 9.07* 9.00*  CALCIUM 9.7 9.2 9.2  PHOS  --   --  6.1*   Liver Function Tests:  Recent Labs Lab 10/14/15 0302 10/15/15 0710  AST 34  --   ALT 16*  --   ALKPHOS 174*  --   BILITOT 1.5*  --   PROT 8.4*  --   ALBUMIN 3.5 3.5    Recent Labs Lab 10/14/15 0302  LIPASE 71*   CBC:  Recent Labs Lab 10/14/15 0302 10/15/15 0624 10/15/15 0710  WBC 2.4* 3.1* 3.0*  NEUTROABS 1.2*  --   --   HGB 11.6* 11.6* 11.4*  HCT 34.6* 34.7* 33.7*  MCV 87.6 86.5 86.2  PLT 83* 79*  --    Blood Culture    Component Value Date/Time   SDES PERITONEAL FLUID 09/02/2014 1344   SPECREQUEST NONE 09/02/2014 1344   CULT  09/02/2014 1344    NO GROWTH 3 DAYS Performed at  [facility]    REPTSTATUS 09/05/2014 FINAL 09/02/2014 1344    Cardiac Enzymes: No results for input(s): CKTOTAL, CKMB, CKMBINDEX, TROPONINI in the last 168 hours. CBG: No results for input(s): GLUCAP in the last 168 hours. Iron Studies: No results for input(s): IRON, TIBC, TRANSFERRIN, FERRITIN in the last 72 hours. @ Studies/Results: No results found. Medications:   . doxercalciferol  5 mcg Intravenous Q M,W,F-HD  . labetalol  200 mg Oral Daily  . pantoprazole  40 mg Oral BID  . polyethylene glycol  17 g Oral BID  . sevelamer carbonate  2,400 mg Oral TID WC  . sodium chloride flush  3 mL Intravenous Q12H

## 2015-10-17 NOTE — Progress Notes (Signed)
Filed Vitals:   10/17/15 0518 10/17/15 0937 10/17/15 1214 10/17/15 1227  BP: 124/88 118/90 122/87 110/82  Pulse: 82 83 74 89  Temp: 98.6 F (37 C) 98.4 F (36.9 C) 98 F (36.7 C)   TempSrc:  Oral Oral   Resp: Weight:   95.1 kg (209 lb 10.5 oz)   SpO2: 95% 96% 95%     Intake/Output Summary (Last 24 hours) at 10/17/15 1246 Last data filed at 10/17/15 0900  Gross per 24 hour  Intake   1680 ml  Output      0 ml  Net   1680 ml   Filed Weights   10/16/15 0500 10/16/15 2034 10/17/15 1214  Weight: 95 kg (209 lb 7 oz) 94.8 kg (208 lb 15.9 oz) 95.1 kg (209 lb 10.5 oz)    Subjective Complains of abdominal bloating. No pain. Currently in dialysis.  Marland Kitchen doxercalciferol  5 mcg Intravenous Q M,W,F-HD  . feeding supplement (NEPRO CARB STEADY)  237 mL Oral BID BM  . labetalol  200 mg Oral Daily  . multivitamin  1 tablet Oral QHS  . pantoprazole  40 mg Oral BID  . polyethylene glycol  17 g Oral BID  . sevelamer carbonate  2,400 mg Oral TID WC  . sodium chloride flush  3 mL Intravenous Q12H      LABS: Basic Metabolic Panel:  Recent Labs  16/10/96 0624 10/15/15 0710  NA 134* 133*  K 5.6* 5.6*  CL 93* 95*  CO2 26 24  GLUCOSE 58* 91  BUN 45* 46*  CREATININE 9.07* 9.00*  CALCIUM 9.2 9.2  PHOS  --  6.1*   Liver Function Tests:  Recent Labs  10/15/15 0710  ALBUMIN 3.5   No results for input(s): LIPASE, AMYLASE in the last 72 hours. CBC:  Recent Labs  10/15/15 0624 10/15/15 0710  WBC 3.1* 3.0*  HGB 11.6* 11.4*  HCT 34.7* 33.7*  MCV 86.5 86.2  PLT 79*  --    Cardiac Enzymes: No results for input(s): CKTOTAL, CKMB, CKMBINDEX, TROPONINI in the last 72 hours. BNP: No results for input(s): PROBNP in the last 72 hours. D-Dimer: No results for input(s): DDIMER in the last 72 hours. Hemoglobin A1C: No results for input(s): HGBA1C in the last 72 hours. Fasting Lipid Panel: No results for input(s): CHOL, HDL, LDLCALC, TRIG, CHOLHDL, LDLDIRECT in the  last 72 hours. Thyroid Function Tests: No results for input(s): TSH, T4TOTAL, T3FREE, THYROIDAB in the last 72 hours.  Invalid input(s): FREET3   Radiology/Studies:  No results found.  PHYSICAL EXAM Well nourished, well developed, in no acute distress  HEENT: Pupils are equal round react to light accommodation extraocular movements are intact.  Neck: chronically elevated JVD No cervical lymphadenopathy. Cardiac: Regular rate and rhythm without murmurs rubs or gallops.  Lungs: clear to auscultation bilaterally, no wheezing, rhonchi or rales  Abd: Firm, distended, nontender, positive bowel sounds. Marked hepatic enlargement. Ext: no lower extremity edema. 2+ radial pulses. Skin: warm and dry  Neuro: Grossly normal  ASSESSMENT AND PLAN: 1. Chronic biventricular CHF. Volume status looks pretty reasonable. Plan to dialyse today about 4-5 liters. Chronic JVD and hepatic congestion/enlargement. I doubt liver size will change much with volume loss. Patient plans to have EGD to evaluate abdominal pain. This may be helpful to rule out portal gastropathy or varices.  2. Chronic Afib. Rate controlled. Refuses anticoagulation with history of spontaneous bleed.   At this point nothing further to  add from a cardiac standpoint. Will need follow up as outpatient with [name]. Please call with questions.  Present on Admission:  . Intractable abdominal pain . ANEMIA, IRON DEFICIENCY . Essential hypertension . GERD (gastroesophageal reflux disease) . Thrombocytopenia (HCC) . Abdominal distension . Hepatic cirrhosis (HCC) . Chronic atrial fibrillation (HCC)  Signed, Trevor [name], MDFACC 10/17/2015 12:46 PM

## 2015-10-17 NOTE — Progress Notes (Signed)
[facility] PROGRESS NOTE  Trevor [name] KFM:[id number] DOB: 1951/06/18 DOA: 10/14/2015 PCP: [name], MD  Brief narrative 65 year old male with end-stage renal disease on dialysis, biventricular heart failure, atrial fibrillation, DVT/PE. 10 Coumadin, hypertension, GERD presented to the ED with severe abdominal pain with intractable nausea and vomiting.  Assessment/Plan: Intractable abdominal pain with nausea and vomiting CT of the abdomen and pelvis negative for acute findings but showed hepatosplenomegaly with mild fluid overload that might be contributing to symptoms. Physical exam positive for hepatojugular reflex and elevated JVD. Hepatic congestion suspected to be contributing to his abdominal pain symptoms. -Continue pain control with IV morphine. Evaluate for symptom improvement after dialysis. If symptoms persist and will ask GI to evaluate. (Rule out portal gastropathy)  End-stage renal disease Dialysis on M, W, F . Scheduled dialysis today. Check improvement in abdominal symptoms after dialysis. Continue headache total. Renal consult following.  Chronic biventricular CHF EF as per previous echo of 25 - 30%. Has significant hepatosplenomegaly with congestion. Cardiology recommended to continue volume management with dialysis.   Essential hypertension Low diastolic blood pressure. Monitor while on labetalol (200 mg daily)  Chronic A. fib Rate controlled. Not on anticoagulation due to history of bleed. Continue labetalol for rate control.  Diet: Heart healthy/renal     Code Status: Full code Family Communication: None at bedside Disposition Plan: Home once abdominal pain symptoms better   Consultants:  Renal  Cardiology  Procedures:  Dialysis  Antibiotics:  None  HPI/Subjective: Seen and examined. Still complains of some abdominal pain  Objective: Filed Vitals:   10/17/15 1458 10/17/15 1526  BP: 110/80 164/42  Pulse: 88 91  Temp:     Resp: 24     Intake/Output Summary (Last 24 hours) at 10/17/15 1532 Last data filed at 10/17/15 1300  Gross per 24 hour  Intake   1320 ml  Output      0 ml  Net   1320 ml   Filed Weights   10/16/15 0500 10/16/15 2034 10/17/15 1214  Weight: 95 kg (209 lb 7 oz) 94.8 kg (208 lb 15.9 oz) 95.1 kg (209 lb 10.5 oz)    Exam:   General:  Elderly male not in distress  HEENT: No pallor, moist mucosa  Chest: Diminished bibasilar breath sounds  Cardiovascular: Normal S1 and S2, no murmurs rub or gallop  GI: Soft, distended, mild tenderness over mid abdomen, bowel sounds present  Musculoskeletal: Warm, no edema  CNS: Alert and oriented  Data Reviewed: Basic Metabolic Panel:  Recent Labs Lab 10/14/15 0302 10/15/15 0624 10/15/15 0710 10/17/15 1230  NA 136 134* 133* 132*  K 5.0 5.6* 5.6* 4.5  CL 96* 93* 95* 94*  CO2 29 26 24 25   GLUCOSE 88 58* 91 89  BUN 35* 45* 46* 44*  CREATININE 7.34* 9.07* 9.00* 9.46*  CALCIUM 9.7 9.2 9.2 9.3  PHOS  --   --  6.1* 5.1*   Liver Function Tests:  Recent Labs Lab 10/14/15 0302 10/15/15 0710 10/17/15 1230  AST 34  --   --   ALT 16*  --   --   ALKPHOS 174*  --   --   BILITOT 1.5*  --   --   PROT 8.4*  --   --   ALBUMIN 3.5 3.5 3.3*    Recent Labs Lab 10/14/15 0302  LIPASE 71*   No results for input(s): AMMONIA in the last 168 hours. CBC:  Recent Labs Lab 10/14/15 0302 10/15/15 6770 10/15/15 0710  10/17/15 1230  WBC 2.4* 3.1* 3.0* 2.2*  NEUTROABS 1.2*  --   --   --   HGB 11.6* 11.6* 11.4* 10.6*  HCT 34.6* 34.7* 33.7* 32.5*  MCV 87.6 86.5 86.2 85.8  PLT 83* 79*  --  79*   Cardiac Enzymes: No results for input(s): CKTOTAL, CKMB, CKMBINDEX, TROPONINI in the last 168 hours. BNP (last 3 results) No results for input(s): BNP in the last 8760 hours.  ProBNP (last 3 results) No results for input(s): PROBNP in the last 8760 hours.  CBG: No results for input(s): GLUCAP in the last 168 hours.  No results found for  this or any previous visit (from the past 240 hour(s)).   Studies: No results found.  Scheduled Meds: . doxercalciferol      . doxercalciferol  5 mcg Intravenous Q M,W,F-HD  . feeding supplement (NEPRO CARB STEADY)  237 mL Oral BID BM  . labetalol  200 mg Oral Daily  . morphine      . multivitamin  1 tablet Oral QHS  . pantoprazole  40 mg Oral BID  . polyethylene glycol  17 g Oral BID  . sevelamer carbonate  2,400 mg Oral TID WC  . sodium chloride flush  3 mL Intravenous Q12H   Continuous Infusions:     Time spent: 25 minutes    Sheva Mcdougle  [facility] Pager [phone]. If 7PM-7AM, please contact night-coverage at [email or web address], password [facility] 10/17/2015, 3:32 PM  LOS: 1 day

## 2015-10-18 ENCOUNTER — Encounter (HOSPITAL_COMMUNITY): Payer: Self-pay | Admitting: Nephrology

## 2015-10-18 DIAGNOSIS — R162 Hepatomegaly with splenomegaly, not elsewhere classified: Secondary | ICD-10-CM

## 2015-10-18 DIAGNOSIS — R140 Abdominal distension (gaseous): Secondary | ICD-10-CM

## 2015-10-18 DIAGNOSIS — R109 Unspecified abdominal pain: Principal | ICD-10-CM

## 2015-10-18 HISTORY — DX: Hepatomegaly with splenomegaly, not elsewhere classified: R16.2

## 2015-10-18 NOTE — Progress Notes (Deleted)
[facility] Progress Note  Assessment/Plan: 1. Abdominal pain/distention: no sig ascites on CT. Marked hepatomegaly but not sure if this is causing him symptoms. Known severe CM which is likely cause of large liver. Seen by cardiology, vol control with HD main priority.GI has been consulted. Planned OP endoscopy ? Do while inpatient?  2. End-stage renal disease: MWF at [facility]. For HD tomorrow on schedule. K+4.5. No heparin.  3. Hypertension/Volume: BP stable now. Had HD 10/17/15 Net UF 4000. Post wt 90.1 kg. Will attempt 3-3.5 liters tomorrow-pt wishes to challenge , reinforce fld restrictions.  4. Metabolic bone disease: Sevelamer restarted with meals 3 times a day with meals, resumed [name]. Phos down 5.1 Ca 9.3 C Ca 9.86 2.0 K 2.25 Ca bath.  5. 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 Check HGB today.  6. Nutrition: Albumin 3.3. Renal diet, fld restrictions, renal vit 7. Afib: per primary. Cardiology consulted-nothing to add-has signed off. Pt refuses coumadin.   Rita H. Brown NP-C 10/18/2015, 12:30 PM  [facility] [phone]  Pt seen, examined and agree w A/P as above.  [name] MD [facility] pager [phone]    cell [phone] 10/18/2015, 2:30 PM    Subjective:     Objective Filed Vitals:   10/17/15 1600 10/17/15 1627 10/18/15 0500 10/18/15 1000  BP: 147/87 135/109 110/73 125/68  Pulse: 98 93 64 72  Temp:  97.1 F (36.2 C) 98.3 F (36.8 C) 98.6 F (37 C)  TempSrc:  Oral Oral Oral  Resp: 18 20 20 18   Weight:  90.9 kg (200 lb 6.4 oz)    SpO2:   98% 96%   Physical Exam General: Well nourished male in NAD Heart: Irregular, S1, S2, Afib on monitor. Chronic JVD.  Lungs: Bilateral breath sounds CTA Abdomen: abdomen firm, liver prominent, tender across epigastric area.  Extremities: No LE edema Dialysis Access: LUA AVF + bruit  Dialysis Orders: SW.  [facility] Monday/Wednesday/Friday, 4 hours 15 minutes, 200NR dialyzer, blood flow 500, dialysate flow 800, estimated dry weight 91 kg, 2K/2 calcium, no UF profile, no sodium profile. Left brachiobasilic fistula. [name] 5 g IV every Monday Wednesday Friday. No heparin.  Additional Objective Labs: Basic Metabolic Panel:  Recent Labs Lab 10/15/15 0624 10/15/15 0710 10/17/15 1230  NA 134* 133* 132*  K 5.6* 5.6* 4.5  CL 93* 95* 94*  CO2 26 24 25   GLUCOSE 58* 91 89  BUN 45* 46* 44*  CREATININE 9.07* 9.00* 9.46*  CALCIUM 9.2 9.2 9.3  PHOS  --  6.1* 5.1*   Liver Function Tests:  Recent Labs Lab 10/14/15 0302 10/15/15 0710 10/17/15 1230  AST 34  --   --   ALT 16*  --   --   ALKPHOS 174*  --   --   BILITOT 1.5*  --   --   PROT 8.4*  --   --   ALBUMIN 3.5 3.5 3.3*    Recent Labs Lab 10/14/15 0302  LIPASE 71*   CBC:  Recent Labs Lab 10/14/15 0302 10/15/15 0624 10/15/15 0710 10/17/15 1230  WBC 2.4* 3.1* 3.0* 2.2*  NEUTROABS 1.2*  --   --   --   HGB 11.6* 11.6* 11.4* 10.6*  HCT 34.6* 34.7* 33.7* 32.5*  MCV 87.6 86.5 86.2 85.8  PLT 83* 79*  --  79*   Blood Culture    Component Value Date/Time   SDES PERITONEAL FLUID 09/02/2014 1344   SPECREQUEST NONE 09/02/2014 1344  CULT  09/02/2014 1344    NO GROWTH 3 DAYS Performed at [facility]    REPTSTATUS 09/05/2014 FINAL 09/02/2014 1344    Cardiac Enzymes: No results for input(s): CKTOTAL, CKMB, CKMBINDEX, TROPONINI in the last 168 hours. CBG: No results for input(s): GLUCAP in the last 168 hours. Iron Studies: No results for input(s): IRON, TIBC, TRANSFERRIN, FERRITIN in the last 72 hours. @ Studies/Results: No results found. Medications:   . doxercalciferol  5 mcg Intravenous Q M,W,F-HD  . feeding supplement (NEPRO CARB STEADY)  237 mL Oral BID BM  . labetalol  200 mg Oral Daily  . multivitamin  1 tablet Oral QHS  . pantoprazole  40 mg Oral BID  . sevelamer carbonate   2,400 mg Oral TID WC  . sodium chloride flush  3 mL Intravenous Q12H

## 2015-10-18 NOTE — Progress Notes (Addendum)
[facility] PROGRESS NOTE  Trevor [name] PJS:[id number] DOB: 27-Jun-1951 DOA: 10/14/2015 PCP: [name], MD  Brief narrative 65 year old male with end-stage renal disease on dialysis, biventricular heart failure, atrial fibrillation, DVT/PE. 10 Coumadin, hypertension, GERD presented to the ED with severe abdominal pain with intractable nausea and vomiting.  Assessment/Plan: Intractable abdominal pain with nausea and vomiting CT of the abdomen and pelvis negative for acute findings but showed hepatosplenomegaly with mild fluid overload that might be contributing to symptoms. Physical exam positive for hepatojugular reflex and elevated JVD. Hepatic congestion suspected to be contributing to his abdominal pain symptoms. -Continue pain control with IV morphine. Nausea and vomiting have resolved patient still has abdominal distention and discomfort which is minimally improved after dialysis . [facility] consulted to evaluate given his hepatosplenomegaly and rule out portal gastropathy.  End-stage renal disease Dialysis on M, W, F .  Continue hectoral. Renal consult following.  Chronic biventricular CHF EF as per previous echo of 25 - 30%. Has significant hepatosplenomegaly with congestion. Cardiology recommended to continue volume management with dialysis.   Essential hypertension Low diastolic blood pressure. Monitor while on labetalol (200 mg daily)  Chronic A. fib Rate controlled. Not on anticoagulation due to history of bleed. Continue labetalol for rate control.  Diet: Heart healthy/renal     Code Status: Full code Family Communication: None at bedside Disposition Plan: Home once abdominal pain symptoms better   Consultants:  Renal  Cardiology  Procedures:  Dialysis  Antibiotics:  None  HPI/Subjective: Seen and examined. Still has abdominal discomfort and distention. No nausea or vomiting  Objective: Filed Vitals:   10/18/15 0500 10/18/15 1000  BP:  110/73 125/68  Pulse: 64 72  Temp: 98.3 F (36.8 C) 98.6 F (37 C)  Resp: 20 18    Intake/Output Summary (Last 24 hours) at 10/18/15 1100 Last data filed at 10/18/15 0900  Gross per 24 hour  Intake    780 ml  Output   4000 ml  Net  -3220 ml   Filed Weights   10/16/15 2034 10/17/15 1214 10/17/15 1627  Weight: 94.8 kg (208 lb 15.9 oz) 95.1 kg (209 lb 10.5 oz) 90.9 kg (200 lb 6.4 oz)    Exam:   General:   not in distress  HEENT:  moist mucosa, JVD +  Chest: Diminished bibasilar breath sounds  Cardiovascular:  S1 and S2 irregular, no murmurs rub or gallop  GI: Soft, distended, minimal midabdominal tenderness, bowel sounds present  Musculoskeletal: Warm, no edema    Data Reviewed: Basic Metabolic Panel:  Recent Labs Lab 10/14/15 0302 10/15/15 0624 10/15/15 0710 10/17/15 1230  NA 136 134* 133* 132*  K 5.0 5.6* 5.6* 4.5  CL 96* 93* 95* 94*  CO2 29 26 24 25   GLUCOSE 88 58* 91 89  BUN 35* 45* 46* 44*  CREATININE 7.34* 9.07* 9.00* 9.46*  CALCIUM 9.7 9.2 9.2 9.3  PHOS  --   --  6.1* 5.1*   Liver Function Tests:  Recent Labs Lab 10/14/15 0302 10/15/15 0710 10/17/15 1230  AST 34  --   --   ALT 16*  --   --   ALKPHOS 174*  --   --   BILITOT 1.5*  --   --   PROT 8.4*  --   --   ALBUMIN 3.5 3.5 3.3*    Recent Labs Lab 10/14/15 0302  LIPASE 71*   No results for input(s): AMMONIA in the last 168 hours. CBC:  Recent Labs Lab  10/14/15 0302 10/15/15 0624 10/15/15 0710 10/17/15 1230  WBC 2.4* 3.1* 3.0* 2.2*  NEUTROABS 1.2*  --   --   --   HGB 11.6* 11.6* 11.4* 10.6*  HCT 34.6* 34.7* 33.7* 32.5*  MCV 87.6 86.5 86.2 85.8  PLT 83* 79*  --  79*   Cardiac Enzymes: No results for input(s): CKTOTAL, CKMB, CKMBINDEX, TROPONINI in the last 168 hours. BNP (last 3 results) No results for input(s): BNP in the last 8760 hours.  ProBNP (last 3 results) No results for input(s): PROBNP in the last 8760 hours.  CBG: No results for input(s): GLUCAP in the  last 168 hours.  No results found for this or any previous visit (from the past 240 hour(s)).   Studies: No results found.  Scheduled Meds: . doxercalciferol  5 mcg Intravenous Q M,W,F-HD  . feeding supplement (NEPRO CARB STEADY)  237 mL Oral BID BM  . labetalol  200 mg Oral Daily  . multivitamin  1 tablet Oral QHS  . pantoprazole  40 mg Oral BID  . sevelamer carbonate  2,400 mg Oral TID WC  . sodium chloride flush  3 mL Intravenous Q12H   Continuous Infusions:     Time spent: 25 minutes    Trevor [name]  [facility] Pager [phone]. If 7PM-7AM, please contact night-coverage at [email or web address], password [facility] 10/18/2015, 11:00 AM  LOS: 2 days

## 2015-10-18 NOTE — Progress Notes (Signed)
[facility] Progress Note  Assessment/Plan: 1. Abdominal pain/distention: no sig ascites on CT. Known hepatomegaly worked up in Dec 2015 w liver biopsy which was neg for cirrhosis and showed high hepatic vein pressures suggesting hepatic congestion from known chronic heart failure GI has followed patient for several years for abd pain.  Hep B/C workup in the past was negative as well.   2. End-stage renal disease: MWF at [facility]. For HD tomorrow on schedule. K+4.5. No heparin.  3. Hypertension/Volume: BP stable now. Had HD 10/17/15 Net UF 4000. Post wt 90.1 kg. Will attempt 3-3.5 liters tomorrow-pt wishes to challenge , reinforce fld restrictions.  4. Metabolic bone disease: Sevelamer restarted with meals 3 times a day with meals, resumed [name]. Phos down 5.1 Ca 9.3 C Ca 9.86 2.0 K 2.25 Ca bath.  5. 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 Check HGB today.  6. Nutrition: Albumin 3.3. Renal diet, fld restrictions, renal vit 7. Afib: per primary. Cardiology consulted-nothing to add-has signed off. Pt refuses coumadin.   Rita H. Brown NP-C 10/18/2015, 3:05 PM  [facility] [phone]  Pt seen, examined and agree w A/P as above.  [name] MD [facility] pager [phone]    cell [phone] 10/18/2015, 3:05 PM    Subjective:     Objective Filed Vitals:   10/17/15 1600 10/17/15 1627 10/18/15 0500 10/18/15 1000  BP: 147/87 135/109 110/73 125/68  Pulse: 98 93 64 72  Temp:  97.1 F (36.2 C) 98.3 F (36.8 C) 98.6 F (37 C)  TempSrc:  Oral Oral Oral  Resp: 18 20 20 18   Weight:  90.9 kg (200 lb 6.4 oz)    SpO2:   98% 96%   Physical Exam General: Well nourished male in NAD Heart: Irregular, S1, S2, Afib on monitor. Chronic JVD.  Lungs: Bilateral breath sounds CTA Abdomen: abdomen firm, liver prominent, tender across epigastric area.  Extremities: No LE edema Dialysis  Access: LUA AVF + bruit  Dialysis Orders: SW.  [facility] Monday/Wednesday/Friday, 4 hours 15 minutes, 200NR dialyzer, blood flow 500, dialysate flow 800, estimated dry weight 91 kg, 2K/2 calcium, no UF profile, no sodium profile. Left brachiobasilic fistula. [name] 5 g IV every Monday Wednesday Friday. No heparin.  Additional Objective Labs: Basic Metabolic Panel:  Recent Labs Lab 10/15/15 0624 10/15/15 0710 10/17/15 1230  NA 134* 133* 132*  K 5.6* 5.6* 4.5  CL 93* 95* 94*  CO2 26 24 25   GLUCOSE 58* 91 89  BUN 45* 46* 44*  CREATININE 9.07* 9.00* 9.46*  CALCIUM 9.2 9.2 9.3  PHOS  --  6.1* 5.1*   Liver Function Tests:  Recent Labs Lab 10/14/15 0302 10/15/15 0710 10/17/15 1230  AST 34  --   --   ALT 16*  --   --   ALKPHOS 174*  --   --   BILITOT 1.5*  --   --   PROT 8.4*  --   --   ALBUMIN 3.5 3.5 3.3*    Recent Labs Lab 10/14/15 0302  LIPASE 71*   CBC:  Recent Labs Lab 10/14/15 0302 10/15/15 0624 10/15/15 0710 10/17/15 1230  WBC 2.4* 3.1* 3.0* 2.2*  NEUTROABS 1.2*  --   --   --   HGB 11.6* 11.6* 11.4* 10.6*  HCT 34.6* 34.7* 33.7* 32.5*  MCV 87.6 86.5 86.2 85.8  PLT 83* 79*  --  79*   Blood Culture    Component Value Date/Time  SDES PERITONEAL FLUID 09/02/2014 1344   SPECREQUEST NONE 09/02/2014 1344   CULT  09/02/2014 1344    NO GROWTH 3 DAYS Performed at [facility]    REPTSTATUS 09/05/2014 FINAL 09/02/2014 1344    Cardiac Enzymes: No results for input(s): CKTOTAL, CKMB, CKMBINDEX, TROPONINI in the last 168 hours. CBG: No results for input(s): GLUCAP in the last 168 hours. Iron Studies: No results for input(s): IRON, TIBC, TRANSFERRIN, FERRITIN in the last 72 hours. @ Studies/Results: No results found. Medications:   . doxercalciferol  5 mcg Intravenous Q M,W,F-HD  . feeding supplement (NEPRO CARB STEADY)  237 mL Oral BID BM  . labetalol  200 mg Oral Daily  . multivitamin  1 tablet Oral QHS  .  pantoprazole  40 mg Oral BID  . sevelamer carbonate  2,400 mg Oral TID WC  . sodium chloride flush  3 mL Intravenous Q12H

## 2015-10-18 NOTE — Consult Note (Signed)
[facility] Consult: 10:40 AM 10/18/2015  LOS: 2 days    Referring Provider: Dr Gonzella Lex.   Primary Care Physician:  [name], MD Primary Gastroenterologist:  Dr Arlyce Dice     Reason for Consultation:  Abdominal pain, bloating.    HPI: Trevor [name] is a 65 y.o. male.  ESRD.  On HD MWF.  Biventricular heart failure.  Thrombocytopenia.  10/2013 CT: Splenomegaly, reflux contrast into hepatic veins, c/w right heart dysfunction. Ascites.  S/p Paracentesis 09/02/2014: no SBP.  Liver biopsy 08/2014: sinusoidal dilatation, scattered foci hepatitis.  At one point was thought he had cirrhosis, but work up proved this incorrect. Constipation/bloating, treated with Amitiza in past (no longer takes as constipation not an issue).  S/p 01/2014 laparoscopic umbilical hernia repair. A fib/flutter, not on AC due to anemia and hx large spontaneous hematoma on back.  S/p cardioversion 08/2013.   CAD.  2014 gastric emptying study: Normal.  2007 Colonocopy for IDA.  Dr Arlyce Dice.  Left and sigmoid tics.  Next due 10/2015.   Pt with worsening pattern of many years of chronic abdominal bloating, Simethicone not helping.  Also upper abdominal pain is constant but intermittently acute. This started after umbilical herniorrhaphy.  Little nausea.  Having daily BMs.  Pain not worse with or without po.  Minor improvement after BM.  No pain meds for this used at home.  In pt getting morphine which helps but pain persists when med wears off. Started on PPI at admit.  No reflux at home.  No dysphagia.  No increased belching.   Appetite good.  Weight stable.   2/5 CT abd/pelvis with contrast shows " Chronic granulomatous disease with adenopathy and splenomegaly",  volume overload.   Bulky adenopathy in the hepatic hilum with faint calcifications,  stable since 2015. Portacaval node measures 33 mm in short axis.  Small ascites in the pelvis, likely from volume overload.  Large appearance of the liver without acute finding. Stable 14 mm subcapsular nodule in segment 6. Hazy high-density layering in the gallbladder, likely sludge. No calcified gallstone. Pericholecystic edema is mild and in keeping with diffuse retroperitoneal edema a considered reactive (could be secondary to patient's volume overload, hepatitis-C, or right heart dysfunction).  Labs:  Hep C, B, A serologies negative.  FOBT negative.  Platelets 79, not new.   No PPI or constipation meds at home.   Past Medical History  Diagnosis Date  . Anemia   . AVF (arteriovenous fistula) (HCC)     Left  . Secondary hyperparathyroidism (HCC)   . Hypovitaminosis D   . Hypertensive urgency     H/o  . CHF (congestive heart failure) (HCC)     EF 20-25%  . Exertional shortness of breath     "related to infection in my lungs right now" (05/03/2013)  . History of gout     "before I started doing the dialysis" (05/03/2013)  . Sleep apnea   . GERD (gastroesophageal reflux disease)   . Syncope     felt secondary to residual anesthesia the day before -  2D echo unremarkable  . Pulmonary embolism (HCC)     with right DVT secondary to recent surgery  . Atrial fibrillation (HCC)     not on coumadin due to large spontaneous hematoma on back and anemia  . Myocardial infarction (HCC) 90's  . Coronary artery disease   . Dysrhythmia     afib  . Peripheral vascular disease (HCC)     dvt leg 12/14  . Hypertension   . ESRD (end stage renal disease) on dialysis (HCC)     [facility] mon/wed/fri  . Atrial flutter (HCC) 04/27/2013  . S/P repair of ventral hernia 03/21/2014  . Hereditary and idiopathic peripheral neuropathy 08/29/2015  . Diverticulosis   . Hepatosplenomegaly 10/18/2015    Workup per Dr Kaplan/ GI in Dec 2015 > abd pain improved after paracentesis x 2 (1.2L, 1.3L). Had negative  hepA/ hep B/ hep C testing. For hepatosplenomagealy pt underwent transjugular liver biopsy by IR with hepatic vein wedge pressure measurement which showed elevated right heart and free hepatic venous pressures likely due to right heart failure.  There was no evidence of portal hypertension. Liver bx originally was reported as "End stage liver disease/ cirrhosis", then was corrected > "No evidence of cirrhosis".  The corrected liver biopsy result reads "SINUSOIDAL DILATATION WITH SCATTERED FOCI OF HEPATITIS".  Last GI visit was Aug 2016 for bloating/ abd pain, noted he had a normal gastric empty study.  -Right lobe liver lesion. Felt to be hemangioma, Biopsy pending.  -thrombocytopenia. Dates back to 08/2013.  -ESRD. MWF HD.  -biventricular heart failure, acute right sided heart failure. Daily HD to address volume overload.      Past Surgical History  Procedure Laterality Date  . Av fistula placement Left     Dr. Charlean Sanfilippo; "I've had 2 on the left' (05/03/2013)  . Av fistula placement Right ~ 2011  . Knee arthroscopy Left   . Avgg removal Right 05/04/2013    Procedure: REMOVAL OF ARTERIOVENOUS Fistula Right Arm;  Surgeon: [name], MD;  Location: [facility] OR;  Service: Vascular;  Laterality: Right;  . Insertion of dialysis catheter Right 05/04/2013    Procedure: INSERTION OF DIALYSIS CATHETER;  Surgeon: [name], MD;  Location: [facility] OR;  Service: Vascular;  Laterality: Right;  . Colonoscopy  10-29-2005    Hx: of  . Bascilic vein transposition Left 06/27/2013    Procedure: BASCILIC VEIN TRANSPOSITION;  Surgeon: [name], MD;  Location: [facility] OR;  Service: Vascular;  Laterality: Left;  . Cardioversion N/A 08/29/2013    Procedure: CARDIOVERSION;  Surgeon: [name], MD;  Location: [facility] ENDOSCOPY;  Service: Cardiovascular;  Laterality: N/A;  . Removal of a dialysis catheter  2/15  . Hernia repair  03/21/14    Umbilical hernia-Dr. Derrell Lolling  . Umbilical hernia repair N/A 03/21/2014     Procedure: LAPAROSCOPIC UMBILICAL HERNIA REPAIR WITH MESH;  Surgeon: Axel Filler, MD;  Location: [facility] OR;  Service: General;  Laterality: N/A;  . Insertion of mesh N/A 03/21/2014    Procedure: INSERTION OF MESH;  Surgeon: Axel Filler, MD;  Location: [facility] OR;  Service: General;  Laterality: N/A;  . Venogram Left 05/26/2013    Procedure: VENOGRAM;  Surgeon: Fransisco Hertz, MD;  Location: [facility] CATH LAB;  Service: Cardiovascular;  Laterality: Left;    Prior to Admission medications   Medication Sig Start Date End Date Taking? Authorizing Provider  B Complex-C (B-COMPLEX WITH VITAMIN C) tablet Take 1 tablet by mouth daily.  Yes Historical Provider, MD  labetalol (NORMODYNE) 200 MG tablet Take 200 mg by mouth daily.    Yes Historical Provider, MD  sevelamer carbonate (RENVELA) 800 MG tablet Take 4 tablets (3,200 mg total) by mouth 3 (three) times daily with meals. Patient taking differently: Take 2,400 mg by mouth 3 (three) times daily with meals.  09/12/14  Yes Marianne L York, PA-C    Scheduled Meds: . doxercalciferol  5 mcg Intravenous Q M,W,F-HD  . feeding supplement (NEPRO CARB STEADY)  237 mL Oral BID BM  . labetalol  200 mg Oral Daily  . multivitamin  1 tablet Oral QHS  . pantoprazole  40 mg Oral BID  . sevelamer carbonate  2,400 mg Oral TID WC  . sodium chloride flush  3 mL Intravenous Q12H   Infusions:   PRN Meds: sodium chloride, diphenhydrAMINE, morphine injection, ondansetron **OR** ondansetron (ZOFRAN) IV, sodium chloride flush, sorbitol, traZODone   Allergies as of 10/14/2015 - Review Complete 10/14/2015  Allergen Reaction Noted  . Pork-derived products Nausea And Vomiting 03/14/2014    Family History  Problem Relation Age of Onset  . Hypertension Father   . Kidney disease Father   . Allergies Father   . Deep vein thrombosis Sister   . Pulmonary embolism Sister   . Diabetes Paternal Grandmother     Social History   Social History  . Marital Status: Single     Spouse Name: N/A  . Number of Children: 4  . Years of Education: N/A   Occupational History  . Disabled    Social History Main Topics  . Smoking status: Former Smoker -- 0.25 packs/day for .5 years    Types: Cigarettes    Quit date: 09/08/1972  . Smokeless tobacco: Never Used  . Alcohol Use: No     Comment: 05/03/2013 "haven't had a glass of wine in ~ 3 months or so; sometimes will have one w/dinner"  . Drug Use: No  . Sexual Activity: Yes    Birth Control/ Protection: None   Other Topics Concern  . Not on file   Social History Narrative   Former smoker- quit over 30 yrs ago   Patient is on disability    REVIEW OF SYSTEMS: Constitutional:  Per HPI.  Works out at [employer], no weakness, no fatigue ENT:  No nose bleeds Pulm:  No SOB or DOE CV:  No palpitations, no LE edema.  GU:  No hematuria, no frequency GI:  Per HPI Heme:  No unusual bleeding or bruising   Neuro:  No headaches, no peripheral tingling or numbness Derm:  No itching, no rash or sores.  Endocrine:  No sweats or chills.  No polyuria or dysuria Immunization:  Did not query Travel:  None beyond local counties in last few months.    PHYSICAL EXAM: Vital signs in last 24 hours: Filed Vitals:   10/18/15 0500 10/18/15 1000  BP: 110/73 125/68  Pulse: 64 72  Temp: 98.3 F (36.8 C) 98.6 F (37 C)  Resp: 20 18   Wt Readings from Last 3 Encounters:  10/17/15 90.9 kg (200 lb 6.4 oz)  04/19/15 91.4 kg (201 lb 8 oz)  10/05/14 87.998 kg (194 lb)    General: pleasant, anxious, AAM.  Does not look ill Head:  No asymmetry or swelling  Eyes:  No icterus or pallor Ears:  Not HOH  Nose:  No congestion or discharge Mouth:  Teeth poor.  Pink, moist and clear oral MM Neck:  No mass,  no JVD Lungs:  Clear bil.  No dyspnea or cough Heart: irreg, irreg.  Rate controlled.  No MRG Abdomen:  Firm, slight upper abdominal tenderness, no guard or rebound.  Not distended or protuberant.  Active BS.  No tinkling or tympanitic BS.   Marland Kitchen   Rectal: deferred   Musc/Skeltl: no joint contractures or deformities Extremities:  No CCE.  Fistula on right arm.   Neurologic:  Moves all 4s, no tremor, no gross weakness.  Skin:  No rash.  Round scars, c/w healed sores on abdomen.  Tattoos:  None seen Nodes:  No cervical adenopathy   Psych:  Pleasant, cooperative, not depressed   Intake/Output from previous day: 02/08 0701 - 02/09 0700 In: 780 [P.O.:780] Out: 4000  Intake/Output this shift: Total I/O In: 360 [P.O.:360] Out: -   LAB RESULTS:  Recent Labs  10/17/15 1230  WBC 2.2*  HGB 10.6*  HCT 32.5*  PLT 79*   BMET Lab Results  Component Value Date   NA 132* 10/17/2015   NA 133* 10/15/2015   NA 134* 10/15/2015   K 4.5 10/17/2015   K 5.6* 10/15/2015   K 5.6* 10/15/2015   CL 94* 10/17/2015   CL 95* 10/15/2015   CL 93* 10/15/2015   CO2 25 10/17/2015   CO2 24 10/15/2015   CO2 26 10/15/2015   GLUCOSE 89 10/17/2015   GLUCOSE 91 10/15/2015   GLUCOSE 58* 10/15/2015   BUN 44* 10/17/2015   BUN 46* 10/15/2015   BUN 45* 10/15/2015   CREATININE 9.46* 10/17/2015   CREATININE 9.00* 10/15/2015   CREATININE 9.07* 10/15/2015   CALCIUM 9.3 10/17/2015   CALCIUM 9.2 10/15/2015   CALCIUM 9.2 10/15/2015   LFT  Recent Labs  10/17/15 1230  ALBUMIN 3.3*   PT/INR Lab Results  Component Value Date   INR 1.30 09/05/2014   INR 1.15 08/30/2014   INR 1.21 09/06/2013   Hepatitis Panel No results for input(s): HEPBSAG, HCVAB, HEPAIGM, HEPBIGM in the last 72 hours. C-Diff No components found for: CDIFF Lipase     Component Value Date/Time   LIPASE 71* 10/14/2015 0302    Drugs of Abuse     Component Value Date/Time   LABOPIA NONE DETECTED 12/11/2007 2233   COCAINSCRNUR NONE DETECTED 12/11/2007 2233   LABBENZ NONE DETECTED 12/11/2007 2233   AMPHETMU NONE DETECTED 12/11/2007 2233   THCU NONE DETECTED 12/11/2007 2233   LABBARB  12/11/2007 2233    NONE DETECTED        DRUG SCREEN FOR MEDICAL PURPOSES ONLY.   IF CONFIRMATION IS NEEDED FOR ANY PURPOSE, NOTIFY LAB WITHIN 5 DAYS.     RADIOLOGY STUDIES: No results found.  ENDOSCOPIC STUDIES: Per HPI  IMPRESSION:   *  Abdominal pain, bloating.  ? Is this due to the hepato/splenomegaly?  Has small volume ascites and volume overload, dry weight reduced with HD but sxs persist.   *  Anemia of chronic disease.  Normocytic.    *  Thrombocytopenia.   *   Hyponatremia.    PLAN:     *  Per Dr Lavon Paganini.    Jennye Moccasin  10/18/2015, 10:40 AM Pager: [phone]      Attending physician's note   I have taken a history, examined the patient and reviewed the chart. I agree with the Advanced Practitioner's note, impression and recommendations.  65 year old male with complaints of diffuse abdominal pain associated with bloating since his umbilical hernia repair surgery 14 months ago, no change in the characteristics  of the pain. Patient is requesting endoscopic evaluation ; discussed in detail with him that endoscopy is likely to be low yield given the generalized nature of his abdominal pain. Likely functional abdominal pain. CT abdomen and pelvis did not show any significant etiology to account for the pain. Follow-up in office and can consider EGD and colonoscopy as outpatient. He is due for recall colonoscopy in 2017. We will sign off please call with any questions  K Scherry Ran, MD [phone] Mon-Fri 8a-5p [phone] after 5p, weekends, holidays

## 2015-10-18 NOTE — Progress Notes (Signed)
Patient refuses CPAP at this time.  

## 2015-10-19 DIAGNOSIS — D696 Thrombocytopenia, unspecified: Secondary | ICD-10-CM

## 2015-10-19 DIAGNOSIS — R1013 Epigastric pain: Secondary | ICD-10-CM

## 2015-10-19 DIAGNOSIS — G8929 Other chronic pain: Secondary | ICD-10-CM

## 2015-10-19 DIAGNOSIS — I420 Dilated cardiomyopathy: Secondary | ICD-10-CM

## 2015-10-19 LAB — RENAL FUNCTION PANEL
Albumin: 3.2 g/dL — ABNORMAL LOW (ref 3.5–5.0)
Anion gap: 14 (ref 5–15)
BUN: 36 mg/dL — ABNORMAL HIGH (ref 6–20)
CO2: 27 mmol/L (ref 22–32)
Calcium: 9.5 mg/dL (ref 8.9–10.3)
Chloride: 95 mmol/L — ABNORMAL LOW (ref 101–111)
Creatinine, Ser: 8.31 mg/dL — ABNORMAL HIGH (ref 0.61–1.24)
GFR calc Af Amer: 7 mL/min — ABNORMAL LOW (ref >60)
GFR calc non Af Amer: 6 mL/min — ABNORMAL LOW (ref >60)
Glucose, Bld: 69 mg/dL (ref 65–99)
Phosphorus: 5 mg/dL — ABNORMAL HIGH (ref 2.5–4.6)
Potassium: 5 mmol/L (ref 3.5–5.1)
Sodium: 136 mmol/L (ref 135–145)

## 2015-10-19 LAB — CBC
HCT: 33 % — ABNORMAL LOW (ref 39.0–52.0)
Hemoglobin: 11.1 g/dL — ABNORMAL LOW (ref 13.0–17.0)
MCH: 28.8 pg (ref 26.0–34.0)
MCHC: 33.6 g/dL (ref 30.0–36.0)
MCV: 85.7 fL (ref 78.0–100.0)
Platelets: 95 10*3/uL — ABNORMAL LOW (ref 150–400)
RBC: 3.85 MIL/uL — ABNORMAL LOW (ref 4.22–5.81)
RDW: 17.2 % — ABNORMAL HIGH (ref 11.5–15.5)
WBC: 2.4 K/uL — ABNORMAL LOW (ref 4.0–10.5)

## 2015-10-19 MED ORDER — LIDOCAINE-PRILOCAINE 2.5-2.5 % EX CREA: 1.0000 "application " | TOPICAL_CREAM | CUTANEOUS | Status: DC | PRN

## 2015-10-19 MED ORDER — OXYCODONE-ACETAMINOPHEN 5-325 MG PO TABS: 2.0000 | ORAL_TABLET | Freq: Four times a day (QID) | ORAL | Status: DC | PRN

## 2015-10-19 MED ORDER — SODIUM CHLORIDE 0.9 % IV SOLN: 100.0000 mL | INTRAVENOUS | Status: DC | PRN

## 2015-10-19 MED ORDER — LIDOCAINE HCL (PF) 1 % IJ SOLN: 5.0000 mL | INTRAMUSCULAR | Status: DC | PRN

## 2015-10-19 MED ORDER — NEPRO/CARBSTEADY PO LIQD: 237.0000 mL | Freq: Two times a day (BID) | ORAL | Status: DC

## 2015-10-19 MED ORDER — PANTOPRAZOLE SODIUM 40 MG PO TBEC: 40.0000 mg | DELAYED_RELEASE_TABLET | Freq: Every day | ORAL | Status: DC

## 2015-10-19 MED ORDER — DOXERCALCIFEROL 4 MCG/2ML IV SOLN
INTRAVENOUS | Status: AC
  Filled 2015-10-19: qty 2

## 2015-10-19 MED ORDER — PENTAFLUOROPROP-TETRAFLUOROETH EX AERO: 1.0000 "application " | INHALATION_SPRAY | CUTANEOUS | Status: DC | PRN

## 2015-10-19 MED ORDER — MORPHINE SULFATE (PF) 2 MG/ML IV SOLN
INTRAVENOUS | Status: AC
  Filled 2015-10-19: qty 1

## 2015-10-19 NOTE — Progress Notes (Signed)
Pt provided with discharge instruction including information on follow up appointments as well as new medications. Pt verbalized understanding of all information. Pt was dc'd immediately upon return from HD and refused this RN to assess. IV was dc'd without complication. Pt escorted out via wheelchair by volunteer.

## 2015-10-19 NOTE — Discharge Summary (Addendum)
Physician Discharge Summary  Trevor [name] ZOX:[id number] DOB: 06/26/1951 DOA: 10/14/2015  PCP: [name], MD  Admit date: 10/14/2015 Discharge date: 10/19/2015  Time spent: 30 minutes  Recommendations for Outpatient Follow-up:  1. Discharge home with outpatient follow-up with [facility]. 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/14   Thrombocytopenia (HCC)   Abdominal pain, chronic, epigastric   Chronic atrial fibrillation (HCC)   RVF (right ventricular failure) (HCC)   Hepatic cirrhosis (HCC)   Hepatosplenomegaly   Discharge Condition: Fair  Diet recommendation: Renal  CODE STATUS: Full code  [facility] Weights   10/18/15 2003 10/19/15 0644 10/19/15 1109  Weight: 92.579 kg (204 lb 1.6 oz) 92.1 kg (203 lb 0.7 oz) 90.2 kg (198 lb 13.7 oz)    History of present illness:  Please refer to admission H&P for details, in brief,65 year old male with end-stage renal disease on dialysis, biventricular heart failure, atrial fibrillation, DVT/PE. 10 Coumadin, hypertension, GERD presented to the ED with severe abdominal pain with intractable nausea and vomiting.   Hospital Course:  Intractable abdominal pain with nausea and vomiting CT of the abdomen and pelvis negative for acute findings but showed hepatosplenomegaly with mild fluid overload that might be contributing to symptoms.   Nausea and vomiting have resolved.  patient has abdominal distention and discomfort which is minimally improved after dialysis . [facility] consulted to evaluate . As per them his diffuse abdominal pain is associated with bloating since his umbilical hernia repair surgery 14 months back without any change in the characteristic of the pain. Suspect this is  likely functional. Patient had a normal gastric imaging study in 2014. He had a colonoscopy for iron to since he anemia in 2007 which showed sigmoid tics. He is due for a repeat colonoscopy in February this year. His hepatitis serologies have been negative. FOBT negative. He has chronic, cytopenia with splenomegaly which is chronic. Patient had liver biopsy in 08/2014 which showed sinusoidal dilatation and scattered foci hepatitis and was negative for cirrhosis. GI recommend that patient does not need EGD as inpatient as it will have a low yield. Since he is due for repeat colonoscopy swollen they will follow-up with him in the office in next few weeks and possibly perform both EGD and colonoscopy. -I will discharge him on when necessary Percocet and Protonix.    End-stage renal disease Received schedule dialysis on M, W, F . Continue hectoral.   Acute on Chronic biventricular systolic CHF EF as per previous echo of 25 - 30%. Has significant hepatosplenomegaly with congestion. Cardiology recommended to continue volume management with dialysis.   Essential hypertension Continue labetalol  Chronic A. fib Rate controlled. Not on anticoagulation due to history of bleed. Continue labetalol for rate control.   Patient is hemodynamically stable to be discharged home with outpatient GI follow-up.   Family Communication: None at bedside Disposition Plan: Home    Consultants:  Renal  Cardiology  [facility]  Procedures:  Dialysis  CT abdomen and pelvis  Antibiotics:  None    Discharge Exam: Filed Vitals:   10/19/15 1100 10/19/15 1109  BP: 110/74 113/81  Pulse: 89 88  Temp:  98.3 F (36.8 C)  Resp: 25 24     General: not in distress  HEENT: moist mucosa,   Chest: Diminished bibasilar breath sounds  Cardiovascular: S1 and S2 irregular, no murmurs rub or gallop  GI: Soft, distended, minimal midabdominal tenderness, bowel sounds present  Musculoskeletal:  Warm, no edema Discharge Instructions    Current Discharge Medication List    START taking these medications   Details  Nutritional Supplements (FEEDING SUPPLEMENT, NEPRO CARB STEADY,) LIQD Take 237 mLs by mouth 2 (two) times daily between meals. Qty: 60 Can, Refills: 0    oxyCODONE-acetaminophen (ROXICET) 5-325 MG tablet Take 2 tablets by mouth every 6 (six) hours as needed for severe pain. Qty: 30 tablet, Refills: 0    pantoprazole (PROTONIX) 40 MG tablet Take 1 tablet (40 mg total) by mouth daily. Qty: 30 tablet, Refills: 0      CONTINUE these medications which have NOT CHANGED   Details  B Complex-C (B-COMPLEX WITH VITAMIN C) tablet Take 1 tablet by mouth daily.    labetalol (NORMODYNE) 200 MG tablet Take 200 mg by mouth daily.     sevelamer carbonate (RENVELA) 800 MG tablet Take 4 tablets (3,200 mg total) by mouth 3 (three) times daily with meals. Qty: 360 tablet, Refills: 3       Allergies  Allergen Reactions  . Pork-Derived Products Nausea And Vomiting    Culture   Follow-up Information    Follow up On 10/30/2015.   Why:  Cardiology  10:15 AM      Follow up with Marsa Aris, MD.   Specialty:  Gastroenterology   Why:  office with schedule for appt. call in 2 weeks if you do not get a call   Contact information:   [address] [place] [address] [phone]        The results of significant diagnostics from this hospitalization (including imaging, microbiology, ancillary and laboratory) are listed below for reference.    Significant Diagnostic Studies: Ct Abdomen Pelvis W Contrast  10/14/2015  CLINICAL DATA:  Abdominal pain beginning in dialysis yesterday EXAM: CT ABDOMEN AND PELVIS WITH CONTRAST TECHNIQUE: Multidetector CT imaging of the abdomen and pelvis was performed using the standard protocol following bolus administration of intravenous contrast. CONTRAST:  21mL OMNIPAQUE IOHEXOL 300 MG/ML  SOLN COMPARISON:  08/30/2014 FINDINGS: Lower  chest and abdominal wall: Chronic cardiomegaly with marked atrial enlargement. There is chronic calcified hilar and mediastinal adenopathy compatible with granulomatous disease. Mild reticulation in the lower lungs which is chronic or atelectatic. No pneumonia or edema suspected. Hepatobiliary: Large appearance of the liver without acute finding. Stable 14 mm subcapsular nodule in segment 6. Hazy high-density layering in the gallbladder, likely sludge. No calcified gallstone. Pericholecystic edema is mild and in keeping with diffuse retroperitoneal edema a considered reactive (could be secondary to patient's volume overload, hepatitis-C, or right heart dysfunction). Pancreas: Unremarkable. Spleen: Chronic mild splenomegaly.  Splenic thickness is 65 mm. Adrenals/Urinary Tract: Negative adrenals. No hydronephrosis or stone. Smooth renal atrophy with simple appearing cysts. Right lower pole nephrolithiasis. No evidence of mass lesion. Decompressed bladder which is unremarkable. Reproductive:Dystrophic prostate calcifications Stomach/Bowel: No obstruction. No inflammatory changes when accounting for sub peritoneal edema from volume overload; no appendicitis. Vascular/Lymphatic: Diffuse atherosclerotic calcification. No finding. There is bulky adenopathy in the hepatic hilum with faint calcifications, stable since 2015. Portacaval node measures 33 mm in short axis. Peritoneal: Small ascites in the pelvis, likely from volume overload. Musculoskeletal: No acute abnormalities. 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 IMPRESSION: 1. No definitive explanation for abdominal pain. 2. Chronic granulomatous disease with adenopathy and splenomegaly.  3. Volume overload. Electronically Signed   By: Marnee Spring M.D.   On: 10/14/2015 06:37    Microbiology: No results found for this or any previous visit (from the past 240 hour(s)).   Labs: Basic Metabolic Panel:  Recent Labs Lab 10/14/15 0302 10/15/15 0624  10/15/15 0710 10/17/15 1230 10/19/15 0730  NA 136 134* 133* 132* 136  K 5.0 5.6* 5.6* 4.5 5.0  CL 96* 93* 95* 94* 95*  CO2 GLUCOSE 88 58* 91 89 69  BUN 35* 45* 46* 44* 36*  CREATININE 7.34* 9.07* 9.00* 9.46* 8.31*  CALCIUM 9.7 9.2 9.2 9.3 9.5  PHOS  --   --  6.1* 5.1* 5.0*   Liver Function Tests:  Recent Labs Lab 10/14/15 0302 10/15/15 0710 10/17/15 1230 10/19/15 0730  AST 34  --   --   --   ALT 16*  --   --   --   ALKPHOS 174*  --   --   --   BILITOT 1.5*  --   --   --   PROT 8.4*  --   --   --   ALBUMIN 3.5 3.5 3.3* 3.2*    Recent Labs Lab 10/14/15 0302  LIPASE 71*   No results for input(s): AMMONIA in the last 168 hours. CBC:  Recent Labs Lab 10/14/15 0302 10/15/15 0624 10/15/15 0710 10/17/15 1230 10/19/15 0732  WBC 2.4* 3.1* 3.0* 2.2* 2.4*  NEUTROABS 1.2*  --   --   --   --   HGB 11.6* 11.6* 11.4* 10.6* 11.1*  HCT 34.6* 34.7* 33.7* 32.5* 33.0*  MCV 87.6 86.5 86.2 85.8 85.7  PLT 83* 79*  --  79* 95*   Cardiac Enzymes: No results for input(s): CKTOTAL, CKMB, CKMBINDEX, TROPONINI in the last 168 hours. BNP: BNP (last 3 results) No results for input(s): BNP in the last 8760 hours.  ProBNP (last 3 results) No results for input(s): PROBNP in the last 8760 hours.  CBG: No results for input(s): GLUCAP in the last 168 hours.     Signed:  Eddie North MD.  [facility] 10/19/2015, 12:20 PM

## 2015-10-19 NOTE — Progress Notes (Signed)
[facility] Progress Note  Assessment/Plan: 1. Abdominal pain/distention/N, V: no sig ascites on CT. Known hepatomegaly worked up in Dec 2015 w liver biopsy which was neg for cirrhosis and showed high hepatic vein pressures suggesting hepatic congestion from known chronic heart failure. GI has followed patient for several years for abd pain. Hep B/C workup in the past was negative as well.  2. End-stage renal disease: MWF K 5 3. Hypertension/Volume: BP variable; BP drop with 1.5 of 4.5 L off pre wt 92.1 EDW 91 - lower goal to 3.5 L lower temp- may need to reduce BB but also has afib 4. Metabolic bone disease: Sevelamer restarted with meals 3 times a day with meals, resumed [name].  5. 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 Check HGB today.  6. Nutrition: Albumin 3.2. Renal diet, fld restrictions, renal vit 7. Afib: per primary. Cardiology consulted-nothing to add-has signed off. Pt refuses coumadin.  Sheffield Slider, PA-C [facility] Beeper [phone] 10/19/2015,10:01 AM  LOS: 3 days   Pt seen, examined and agree w A/P as above.  [name] MD [facility] pager [phone]    cell [phone] 10/19/2015, 11:16 AM    Subjective:   No c/o - was sleeping when BP dropped - didn't feel it.   Objective Filed Vitals:   10/19/15 0800 10/19/15 0830 10/19/15 0859 10/19/15 0930  BP: 128/86 160/125 87/53 92/54  Pulse: 85 90 84 73  Temp:      TempSrc:      Resp: Weight:      SpO2:       Physical Exam pre HD wt 92.1 General: NAD Heart: irreg irreg Lungs: no rales Abdomen: soft slight distended nontender Extremities: tr LE edema Dialysis Access: left upper AVFpatent Dialysis Orders: SW. [facility] Monday/Wednesday/Friday, 4 hours 15 minutes, 200NR dialyzer, blood flow 500, dialysate flow 800, estimated dry weight 91 kg, 2K/2 calcium, no UF profile, no  sodium profile. Left brachiobasilic fistula. [name] 5 g IV every Monday Wednesday Friday. No heparin.  Additional Objective Labs: Basic Metabolic Panel:  Recent Labs Lab 10/15/15 0710 10/17/15 1230 10/19/15 0730  NA 133* 132* 136  K 5.6* 4.5 5.0  CL 95* 94* 95*  CO2 GLUCOSE 91 89 69  BUN 46* 44* 36*  CREATININE 9.00* 9.46* 8.31*  CALCIUM 9.2 9.3 9.5  PHOS 6.1* 5.1* 5.0*   Liver Function Tests:  Recent Labs Lab 10/14/15 0302 10/15/15 0710 10/17/15 1230 10/19/15 0730  AST 34  --   --   --   ALT 16*  --   --   --   ALKPHOS 174*  --   --   --   BILITOT 1.5*  --   --   --   PROT 8.4*  --   --   --   ALBUMIN 3.5 3.5 3.3* 3.2*    Recent Labs Lab 10/14/15 0302  LIPASE 71*   CBC:  Recent Labs Lab 10/14/15 0302 10/15/15 0624 10/15/15 0710 10/17/15 1230 10/19/15 0732  WBC 2.4* 3.1* 3.0* 2.2* 2.4*  NEUTROABS 1.2*  --   --   --   --   HGB 11.6* 11.6* 11.4* 10.6* 11.1*  HCT 34.6* 34.7* 33.7* 32.5* 33.0*  MCV 87.6 86.5 86.2 85.8 85.7  PLT 83* 79*  --  79* 95*   BMedications:   . doxercalciferol  5 mcg Intravenous Q M,W,F-HD  . feeding supplement (NEPRO CARB  STEADY)  237 mL Oral BID BM  . labetalol  200 mg Oral Daily  . multivitamin  1 tablet Oral QHS  . pantoprazole  40 mg Oral BID  . sevelamer carbonate  2,400 mg Oral TID WC  . sodium chloride flush  3 mL Intravenous Q12H

## 2015-10-22 DIAGNOSIS — N2581 Secondary hyperparathyroidism of renal origin: Secondary | ICD-10-CM | POA: Diagnosis not present

## 2015-10-22 DIAGNOSIS — D631 Anemia in chronic kidney disease: Secondary | ICD-10-CM | POA: Diagnosis not present

## 2015-10-22 DIAGNOSIS — N186 End stage renal disease: Secondary | ICD-10-CM | POA: Diagnosis not present

## 2015-10-24 DIAGNOSIS — N2581 Secondary hyperparathyroidism of renal origin: Secondary | ICD-10-CM | POA: Diagnosis not present

## 2015-10-24 DIAGNOSIS — N186 End stage renal disease: Secondary | ICD-10-CM | POA: Diagnosis not present

## 2015-10-24 DIAGNOSIS — D631 Anemia in chronic kidney disease: Secondary | ICD-10-CM | POA: Diagnosis not present

## 2015-10-25 ENCOUNTER — Ambulatory Visit (INDEPENDENT_AMBULATORY_CARE_PROVIDER_SITE_OTHER): Payer: Medicare Other | Admitting: Physician Assistant

## 2015-10-25 ENCOUNTER — Encounter: Payer: Self-pay | Admitting: Physician Assistant

## 2015-10-25 VITALS — BP 140/100 | HR 76 | Ht 70.47 in | Wt 214.1 lb

## 2015-10-25 DIAGNOSIS — R140 Abdominal distension (gaseous): Secondary | ICD-10-CM

## 2015-10-25 DIAGNOSIS — R1084 Generalized abdominal pain: Secondary | ICD-10-CM | POA: Diagnosis not present

## 2015-10-25 DIAGNOSIS — Z1211 Encounter for screening for malignant neoplasm of colon: Secondary | ICD-10-CM

## 2015-10-25 MED ORDER — NA SULFATE-K SULFATE-MG SULF 17.5-3.13-1.6 GM/177ML PO SOLN: 1.0000 | ORAL | Status: DC

## 2015-10-25 MED ORDER — PANTOPRAZOLE SODIUM 40 MG PO TBEC: 40.0000 mg | DELAYED_RELEASE_TABLET | Freq: Every day | ORAL | Status: DC

## 2015-10-25 NOTE — Patient Instructions (Signed)
You have been scheduled for an endoscopy and colonoscopy. Please follow the written instructions given to you at your visit today. Please pick up your prep supplies at the pharmacy within the next 1-3 days. If you use inhalers (even only as needed), please bring them with you on the day of your procedure. Your physician has requested that you go to www.[email or web address].com and enter the access code given to you at your visit today. This web site gives a general overview about your procedure. However, you should still follow specific instructions given to you by our office regarding your preparation for the procedure.  Please have a CBC (blood count) five days prior to your procedure. Go to the lab here in the basement.   We have sent the following medications to your pharmacy for you to pick up at your convenience: Protonix 40 mg 30 mins prior to breakfast.

## 2015-10-25 NOTE — Progress Notes (Signed)
Patient ID: Trevor [name], male   DOB: 04/30/1951, 66 y.o.   MRN: [id number]     History of Present Illness: Trevor [name]  Who was previously followed by Dr. Deatra Ina. He was recently hospitalized at [facility] 10/14/2015 through 10/19/2015. He was evaluated by Dr. Silverio Decamp in the hospital. He  Has a history of ESRD on HD Monday, Wednesday and Fridays. Biventricular heart failure, thrombocytopenia. CT in February 2015 showed splenomegaly , reflux contrast into hepatic veins, consistent with right heart dysfunction. Ascites. Status post paracentesis 09/02/2014 with no SBP. Liver biopsy December 2015: Sinusoidal dilatation, scattered foci hepatitis. At one point it was thought that he had cirrhosis, but workup prove this incorrect. He has a history of constipation and bloating which is been treated with Amitiza in the past. He no longer takes this his constipation is not an issue. In May 2015 he had a laparoscopic umbilical hernia repair. He has a history of A. Fib/flutter, not on anticoagulation due to anemia and history of a large spontaneous hematoma on back. He is status post cardioversion in December 2014. History CAD. In 2014 he had a gastric emptying study which was normal. In 2007 he had a colonoscopy for iron deficiency anemia that revealed left-sided and sigmoid diverticuli. He was advised to have surveillance colonoscopy in December 2017.    he should states he has had worsening chronic abdominal pain for years. He has tried simethicone with no relief. He has a daily bowel movement. His pain is not alleviated or exacerbated with ingestion of food area he has slight improvement of his pain after defecation. He was receiving morphine in the hospital for his pain which helps but the pain persisted when his meds were off. He went home on hydrocodone and is requesting more. He denies reflux dysphagia or belching. His appetite is good his weight has been stable. FOBT in the hospital was negative.  Platelets were noted to be 79,000, not new. He was evaluated by GI in the hospital with complaints of diffuse abdominal pain associated with bloating since his umbilical hernia repair surgery 14 months ago. There is been no change in the characteristic of his pain. The patient was requesting endoscopic evaluation in the hospital. It was explained to him that endoscopy was likely to be low yield given the generalized nature of his abdominal pain and that his pain was likely functional. CT of the abdomen and pelvis did not show any significant etiology to account for the pain. He was advised to follow-up in the office to be scheduled for his surveillance colonoscopy and endoscopy. He voices extreme discontented that his procedures were not performed while an inpatient.   Past Medical History  Diagnosis Date  . Anemia   . AVF (arteriovenous fistula) (HCC)     Left  . Secondary hyperparathyroidism ([place])   . Hypovitaminosis D   . Hypertensive urgency     H/o  . CHF (congestive heart failure) (HCC)     EF 20-25%  . Exertional shortness of breath     "related to infection in my lungs right now" (05/03/2013)  . History of gout     "before I started doing the dialysis" (05/03/2013)  . Sleep apnea   . GERD (gastroesophageal reflux disease)   . Syncope     felt secondary to residual anesthesia the day before - 2D echo unremarkable  . Pulmonary embolism ([place])     with right DVT secondary to recent surgery  . Atrial fibrillation (  [place])     not on coumadin due to large spontaneous hematoma on back and anemia  . Myocardial infarction ([place]) 90's  . Coronary artery disease   . Dysrhythmia     afib  . Peripheral vascular disease ([place])     dvt leg 12/14  . Hypertension   . ESRD (end stage renal disease) on dialysis ([place])     [facility] mon/wed/fri  . Atrial flutter ([place]) 04/27/2013  . S/P repair of ventral hernia 03/21/2014  . Hereditary and idiopathic peripheral neuropathy 08/29/2015  . Diverticulosis    . Hepatosplenomegaly 10/18/2015    Workup per Dr Kaplan/ GI in Dec 2015 > abd pain improved after paracentesis x 2 (1.2L, 1.3L). Had negative hepA/ hep B/ hep C testing. For hepatosplenomagealy pt underwent transjugular liver biopsy by IR with hepatic vein wedge pressure measurement which showed elevated right heart and free hepatic venous pressures likely due to right heart failure.  There was no evidence of portal hypertension. Liver bx originally was reported as "End stage liver disease/ cirrhosis", then was corrected > "No evidence of cirrhosis".  The corrected liver biopsy result reads "SINUSOIDAL DILATATION WITH SCATTERED FOCI OF HEPATITIS".  Last GI visit was Aug 2016 for bloating/ abd pain, noted he had a normal gastric empty study.  -Right lobe liver lesion. Felt to be hemangioma, Biopsy pending.  -thrombocytopenia. Dates back to 08/2013.  -ESRD. MWF HD.  -biventricular heart failure, acute right sided heart failure. Daily HD to address volume overload.      Past Surgical History  Procedure Laterality Date  . Av fistula placement Left     Dr. Su Grand; "I've had 2 on the left' (05/03/2013)  . Av fistula placement Right ~ 2011  . Knee arthroscopy Left   . Avgg removal Right 05/04/2013    Procedure: REMOVAL OF ARTERIOVENOUS Fistula Right Arm;  Surgeon: Elam Dutch, MD;  Location: [place];  Service: Vascular;  Laterality: Right;  . Insertion of dialysis catheter Right 05/04/2013    Procedure: INSERTION OF DIALYSIS CATHETER;  Surgeon: Elam Dutch, MD;  Location: [place];  Service: Vascular;  Laterality: Right;  . Colonoscopy  10-29-2005    Hx: of  . Bascilic vein transposition Left 06/27/2013    Procedure: BASCILIC VEIN TRANSPOSITION;  Surgeon: Elam Dutch, MD;  Location: [place];  Service: Vascular;  Laterality: Left;  . Cardioversion N/A 08/29/2013    Procedure: CARDIOVERSION;  Surgeon: Sueanne Margarita, MD;  Location: [place];  Service: Cardiovascular;  Laterality: N/A;  . Removal  of a dialysis catheter  2/15  . Hernia repair  0/73/71    Umbilical hernia-Dr. Rosendo Gros  . Umbilical hernia repair N/A 03/21/2014    Procedure: LAPAROSCOPIC UMBILICAL HERNIA REPAIR WITH MESH;  Surgeon: Ralene Ok, MD;  Location: [place];  Service: General;  Laterality: N/A;  . Insertion of mesh N/A 03/21/2014    Procedure: INSERTION OF MESH;  Surgeon: Ralene Ok, MD;  Location: [place];  Service: General;  Laterality: N/A;  . Venogram Left 05/26/2013    Procedure: VENOGRAM;  Surgeon: Conrad McIntosh, MD;  [facility] CATH LAB;  Service: Cardiovascular;  Laterality: Left;   Family History  Problem Relation Age of Onset  . Hypertension Father   . Kidney disease Father   . Allergies Father   . Deep vein thrombosis Sister   . Pulmonary embolism Sister   . Diabetes Paternal Grandmother    Social History  Substance Use Topics  . Smoking status:  Former Smoker -- 0.25 packs/day for .5 years    Types: Cigarettes    Quit date: 09/08/1972  . Smokeless tobacco: Never Used  . Alcohol Use: No     Comment: 05/03/2013 "haven't had a glass of wine in ~ 3 months or so; sometimes will have one w/dinner"   Current Outpatient Prescriptions  Medication Sig Dispense Refill  . B Complex-C (B-COMPLEX WITH VITAMIN C) tablet Take 1 tablet by mouth daily.    Marland Kitchen labetalol (NORMODYNE) 200 MG tablet Take 200 mg by mouth daily.     . Nutritional Supplements (FEEDING SUPPLEMENT, NEPRO CARB STEADY,) LIQD Take 237 mLs by mouth 2 (two) times daily between meals. 60 Can 0  . oxyCODONE-acetaminophen (ROXICET) 5-325 MG tablet Take 2 tablets by mouth every 6 (six) hours as needed for severe pain. 30 tablet 0  . pantoprazole (PROTONIX) 40 MG tablet Take 1 tablet (40 mg total) by mouth daily. 30 tablet 0  . sevelamer carbonate (RENVELA) 800 MG tablet Take 4 tablets (3,200 mg total) by mouth 3 (three) times daily with meals. (Patient taking differently: Take 2,400 mg by mouth 3 (three) times daily with meals. ) 360 tablet 3    . Na Sulfate-K Sulfate-Mg Sulf SOLN Take 1 kit by mouth as directed. 354 mL 0  . pantoprazole (PROTONIX) 40 MG tablet Take 1 tablet (40 mg total) by mouth daily. 30 mins prior to breakfast. 30 tablet 2   No current facility-administered medications for this visit.   Allergies  Allergen Reactions  . Pork-Derived Products Nausea And Vomiting    Culture     Review of Systems:  per history of present illness otherwise negative.   Studies:   Ct Abdomen Pelvis W Contrast  10/14/2015  CLINICAL DATA:  Abdominal pain beginning in dialysis yesterday EXAM: CT ABDOMEN AND PELVIS WITH CONTRAST TECHNIQUE: Multidetector CT imaging of the abdomen and pelvis was performed using the standard protocol following bolus administration of intravenous contrast. CONTRAST:  1m OMNIPAQUE IOHEXOL 300 MG/ML  SOLN COMPARISON:  08/30/2014 FINDINGS: Lower chest and abdominal wall: Chronic cardiomegaly with marked atrial enlargement. There is chronic calcified hilar and mediastinal adenopathy compatible with granulomatous disease. Mild reticulation in the lower lungs which is chronic or atelectatic. No pneumonia or edema suspected. Hepatobiliary: Large appearance of the liver without acute finding. Stable 14 mm subcapsular nodule in segment 6. Hazy high-density layering in the gallbladder, likely sludge. No calcified gallstone. Pericholecystic edema is mild and in keeping with diffuse retroperitoneal edema a considered reactive (could be secondary to patient's volume overload, hepatitis-C, or right heart dysfunction). Pancreas: Unremarkable. Spleen: Chronic mild splenomegaly.  Splenic thickness is 65 mm. Adrenals/Urinary Tract: Negative adrenals. No hydronephrosis or stone. Smooth renal atrophy with simple appearing cysts. Right lower pole nephrolithiasis. No evidence of mass lesion. Decompressed bladder which is unremarkable. Reproductive:Dystrophic prostate calcifications Stomach/Bowel: No obstruction. No inflammatory  changes when accounting for sub peritoneal edema from volume overload; no appendicitis. Vascular/Lymphatic: Diffuse atherosclerotic calcification. No finding. There is bulky adenopathy in the hepatic hilum with faint calcifications, stable since 2015. Portacaval node measures 33 mm in short axis. Peritoneal: Small ascites in the pelvis, likely from volume overload. Musculoskeletal: No acute abnormalities. 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 IMPRESSION: 1. No definitive explanation for abdominal pain. 2. Chronic granulomatous disease with adenopathy and splenomegaly. 3. Volume overload. Electronically Signed   By: JMonte FantasiaM.D.   On: 10/14/2015 06:37     Physical Exam: BP 140/100 mmHg  Pulse 76  Ht 5'  10.47" (1.79 m)  Wt 214 lb 2 oz (97.126 kg)  BMI 30.31 kg/m2 General: Pleasant, well developed ,male in no acute distress Head: Normocephalic and atraumatic Eyes:  sclerae anicteric, conjunctiva pink  Ears: Normal auditory acuity Lungs: Clear throughout to auscultation Heart: Regular rate and rhythm Abdomen:  Firm, mild diffuse tenderness to palpation with no rebound or guarding, no distention. Active bowel sounds. No tympanitic bowel sounds. Musculoskeletal: Symmetrical with no gross deformities . Fistula right arm. Extremities: No edema  Neurological: Alert oriented x 4, grossly nonfocal Psychological:  Alert and cooperative. Normal mood and affect  Assessment and Recommendations:  65 year old male with ongoing complaints of diffuse abdominal pain associated with bloating since his umbilical hernia repair 58-94 months ago , here for evaluation. Patient is due for surveillance colonoscopy to evaluate for polyps or neoplasia. He will also be scheduled for an upper endoscopy at his insistence to evaluate for possible upper GI etiology for his pain.The risks, benefits, and alternatives to colonoscopy with possible biopsy and possible polypectomy were discussed with the patient and they  consent to proceed. The risks, benefits, and alternatives to endoscopy with possible biopsy and possible dilation were discussed with the patient and they consent to proceed.  His procedures will be scheduled at the hospital due to his ejection fraction of 20-25%. His procedures will be scheduled with Dr. Silverio Decamp.        Tylena Prisk, Vita Barley PA-C 10/25/2015,

## 2015-10-26 ENCOUNTER — Emergency Department (HOSPITAL_COMMUNITY)
Admission: EM | Admit: 2015-10-26 | Discharge: 2015-10-26 | Disposition: A | Discharge status: Home / Self Care | Payer: Medicare Other | Attending: Emergency Medicine | Admitting: Emergency Medicine

## 2015-10-26 ENCOUNTER — Encounter (HOSPITAL_COMMUNITY): Payer: Self-pay | Admitting: Emergency Medicine

## 2015-10-26 ENCOUNTER — Emergency Department (HOSPITAL_COMMUNITY): Payer: Medicare Other

## 2015-10-26 DIAGNOSIS — I2510 Atherosclerotic heart disease of native coronary artery without angina pectoris: Secondary | ICD-10-CM | POA: Diagnosis not present

## 2015-10-26 DIAGNOSIS — I252 Old myocardial infarction: Secondary | ICD-10-CM | POA: Insufficient documentation

## 2015-10-26 DIAGNOSIS — K219 Gastro-esophageal reflux disease without esophagitis: Secondary | ICD-10-CM | POA: Diagnosis not present

## 2015-10-26 DIAGNOSIS — Z87891 Personal history of nicotine dependence: Secondary | ICD-10-CM | POA: Insufficient documentation

## 2015-10-26 DIAGNOSIS — Z87448 Personal history of other diseases of urinary system: Secondary | ICD-10-CM | POA: Insufficient documentation

## 2015-10-26 DIAGNOSIS — I509 Heart failure, unspecified: Secondary | ICD-10-CM | POA: Diagnosis not present

## 2015-10-26 DIAGNOSIS — N186 End stage renal disease: Secondary | ICD-10-CM | POA: Insufficient documentation

## 2015-10-26 DIAGNOSIS — E875 Hyperkalemia: Secondary | ICD-10-CM | POA: Insufficient documentation

## 2015-10-26 DIAGNOSIS — Z86711 Personal history of pulmonary embolism: Secondary | ICD-10-CM | POA: Insufficient documentation

## 2015-10-26 DIAGNOSIS — R1010 Upper abdominal pain, unspecified: Secondary | ICD-10-CM | POA: Insufficient documentation

## 2015-10-26 DIAGNOSIS — Z8669 Personal history of other diseases of the nervous system and sense organs: Secondary | ICD-10-CM | POA: Diagnosis not present

## 2015-10-26 DIAGNOSIS — I4891 Unspecified atrial fibrillation: Secondary | ICD-10-CM | POA: Insufficient documentation

## 2015-10-26 DIAGNOSIS — R404 Transient alteration of awareness: Secondary | ICD-10-CM | POA: Diagnosis not present

## 2015-10-26 DIAGNOSIS — Z992 Dependence on renal dialysis: Secondary | ICD-10-CM | POA: Insufficient documentation

## 2015-10-26 DIAGNOSIS — R55 Syncope and collapse: Secondary | ICD-10-CM | POA: Diagnosis not present

## 2015-10-26 DIAGNOSIS — I4892 Unspecified atrial flutter: Secondary | ICD-10-CM | POA: Diagnosis not present

## 2015-10-26 DIAGNOSIS — Z8739 Personal history of other diseases of the musculoskeletal system and connective tissue: Secondary | ICD-10-CM | POA: Insufficient documentation

## 2015-10-26 DIAGNOSIS — Z79899 Other long term (current) drug therapy: Secondary | ICD-10-CM | POA: Diagnosis not present

## 2015-10-26 DIAGNOSIS — Z9889 Other specified postprocedural states: Secondary | ICD-10-CM | POA: Insufficient documentation

## 2015-10-26 DIAGNOSIS — I120 Hypertensive chronic kidney disease with stage 5 chronic kidney disease or end stage renal disease: Secondary | ICD-10-CM | POA: Insufficient documentation

## 2015-10-26 DIAGNOSIS — Z862 Personal history of diseases of the blood and blood-forming organs and certain disorders involving the immune mechanism: Secondary | ICD-10-CM | POA: Diagnosis not present

## 2015-10-26 LAB — BASIC METABOLIC PANEL
Anion gap: 14 (ref 5–15)
BUN: 51 mg/dL — AB (ref 6–20)
CALCIUM: 9.6 mg/dL (ref 8.9–10.3)
CO2: 23 mmol/L (ref 22–32)
Chloride: 101 mmol/L (ref 101–111)
Creatinine, Ser: 8.06 mg/dL — ABNORMAL HIGH (ref 0.61–1.24)
GFR calc Af Amer: 7 mL/min — ABNORMAL LOW (ref >60)
GFR, EST NON AFRICAN AMERICAN: 6 mL/min — AB (ref >60)
GLUCOSE: 82 mg/dL (ref 65–99)
Potassium: 5.3 mmol/L — ABNORMAL HIGH (ref 3.5–5.1)
Sodium: 138 mmol/L (ref 135–145)

## 2015-10-26 LAB — CBC
HCT: 34.4 % — ABNORMAL LOW (ref 39.0–52.0)
Hemoglobin: 11.4 g/dL — ABNORMAL LOW (ref 13.0–17.0)
MCH: 28.1 pg (ref 26.0–34.0)
MCHC: 33.1 g/dL (ref 30.0–36.0)
MCV: 84.9 fL (ref 78.0–100.0)
Platelets: 61 10*3/uL — ABNORMAL LOW (ref 150–400)
RBC: 4.05 MIL/uL — ABNORMAL LOW (ref 4.22–5.81)
RDW: 16.8 % — AB (ref 11.5–15.5)
WBC: 2.5 10*3/uL — ABNORMAL LOW (ref 4.0–10.5)

## 2015-10-26 LAB — TROPONIN I: TROPONIN I: 0.03 ng/mL (ref <0.031)

## 2015-10-26 MED ORDER — SODIUM CHLORIDE 0.9 % IV BOLUS (SEPSIS)
500.0000 mL | Freq: Once | INTRAVENOUS | Status: AC
  Administered 2015-10-26: 500 mL via INTRAVENOUS

## 2015-10-26 MED ORDER — OXYCODONE-ACETAMINOPHEN 5-325 MG PO TABS
2.0000 | ORAL_TABLET | Freq: Once | ORAL | Status: AC
  Administered 2015-10-26: 2 via ORAL
  Filled 2015-10-26: qty 2

## 2015-10-26 NOTE — ED Notes (Signed)
Asked patient for urine sample but he stated he produces very little and can't right now

## 2015-10-26 NOTE — ED Notes (Signed)
Patient states he was up walking at home went back to his bedroom and had a syncopal last a min. Did not hot his head. Patient states has been having some lightheaded and nausea. Patient given 4mg  Zofran with EMS. Patient is  Suppose to have Dialysis states just does not feel good so he did not go.

## 2015-10-26 NOTE — Discharge Instructions (Signed)
Abdominal Pain, Adult Many things can cause abdominal pain. Usually, abdominal pain is not caused by a disease and will improve without treatment. It can often be observed and treated at home. Your health care provider will do a physical exam and possibly order blood tests and X-rays to help determine the seriousness of your pain. However, in many cases, more time must pass before a clear cause of the pain can be found. Before that point, your health care provider may not know if you need more testing or further treatment. HOME CARE INSTRUCTIONS Monitor your abdominal pain for any changes. The following actions may help to alleviate any discomfort you are experiencing:  Only take over-the-counter or prescription medicines as directed by your health care provider.  Do not take laxatives unless directed to do so by your health care provider.  Try a clear liquid diet (broth, tea, or water) as directed by your health care provider. Slowly move to a bland diet as tolerated. SEEK MEDICAL CARE IF:  You have unexplained abdominal pain.  You have abdominal pain associated with nausea or diarrhea.  You have pain when you urinate or have a bowel movement.  You experience abdominal pain that wakes you in the night.  You have abdominal pain that is worsened or improved by eating food.  You have abdominal pain that is worsened with eating fatty foods.  You have a fever. SEEK IMMEDIATE MEDICAL CARE IF:  Your pain does not go away within 2 hours.  You keep throwing up (vomiting).  Your pain is felt only in portions of the abdomen, such as the right side or the left lower portion of the abdomen.  You pass bloody or black tarry stools. MAKE SURE YOU:  Understand these instructions.  Will watch your condition.  Will get help right away if you are not doing well or get worse.   This information is not intended to replace advice given to you by your health care provider. Make sure you discuss  any questions you have with your health care provider.   Document Released: 06/04/2005 Document Revised: 05/16/2015 Document Reviewed: 05/04/2013 Elsevier Interactive Patient Education 2016 [employer]  Hyperkalemia Hyperkalemia is when you have too much potassium in your blood. Potassium is normally removed (excreted) from your body by your kidneys. If there is too much potassium in your blood, it can affect how your heart works. HOME CARE  Take medicines only as told by your doctor.  Do not take any supplements, natural products, herbs, or vitamins unless your doctor says it is okay.  Limit your alcohol intake as told by your doctor.  Stop illegal drug use. If you need help quitting, ask your doctor.  Keep all follow-up visits as told by your doctor. This is important.  If you have kidney disease, you may need to follow a low potassium diet. A food specialist (dietitian) can help you. GET HELP IF:   Your heartbeat is not regular or very slow.  You feel dizzy (light-headed).  You feel weak.  You feel sick to your stomach (nauseous).  You have tingling in your hands or feet.  You cannot feel your hands or feet. GET HELP RIGHT AWAY IF:  You are short of breath.  You have chest pain.  You pass out (faint).  You cannot move your muscles. MAKE SURE YOU:   Understand these instructions.  Will watch your condition.  Will get help right away if you are not doing well or get worse.  This information is not intended to replace advice given to you by your health care provider. Make sure you discuss any questions you have with your health care provider.   Document Released: 08/25/2005 Document Revised: 09/15/2014 Document Reviewed: 11/30/2013 Elsevier Interactive Patient Education [employer]

## 2015-10-26 NOTE — ED Notes (Signed)
Patient given a meal tray. NO complaints at this time.

## 2015-10-26 NOTE — ED Notes (Signed)
Patient resting on the side of the bed.

## 2015-10-26 NOTE — ED Provider Notes (Signed)
CSN: [id number]     Arrival date & time 10/26/15  1015 History   First MD Initiated Contact with Patient 10/26/15 1016     Chief Complaint  Patient presents with  . Loss of Consciousness     (Consider location/radiation/quality/duration/timing/severity/associated sxs/prior Treatment) HPI   Trevor [name] is a 65 y.o. male who presents for evaluation of syncope. He been lying in bed, got up to go to the chin to get a drink, walked back to his bed, sat down immediately passed out. He did not fall to the floor. He awoke on the bed, and felt nauseated. Therefore, he called EMS. He been up earlier to take his daughter to school and did that without problems. So he was due to go to dialysis at 6 AM, but decided not to go this morning because he did not feel fluid overloaded. He states he called his dialysis center. He relates having trouble with dialysis, feeling fatigued after each session and is trying to figure out why that is. He was hospitalized last week, evaluated for abdominal pain, and followed up with the GI provider yesterday. They plan on further evaluation of ongoing abdominal pain with endoscopy, upper and lower. These have not been scheduled yet. He is using oxycodone, which she takes 2 or 3 each day, for upper abdominal pain. He denies constipation, nausea, vomiting, anorexia, chest pain, palpitations or cough. He is taking his usual medications. There are no other known modifying factors.   Past Medical History  Diagnosis Date  . Anemia   . AVF (arteriovenous fistula) (HCC)     Left  . Secondary hyperparathyroidism ([place])   . Hypovitaminosis D   . Hypertensive urgency     H/o  . CHF (congestive heart failure) (HCC)     EF 20-25%  . Exertional shortness of breath     "related to infection in my lungs right now" (05/03/2013)  . History of gout     "before I started doing the dialysis" (05/03/2013)  . Sleep apnea   . GERD (gastroesophageal reflux disease)   . Syncope     felt  secondary to residual anesthesia the day before - 2D echo unremarkable  . Pulmonary embolism ([place])     with right DVT secondary to recent surgery  . Atrial fibrillation ([place])     not on coumadin due to large spontaneous hematoma on back and anemia  . Myocardial infarction ([place]) 90's  . Coronary artery disease   . Dysrhythmia     afib  . Peripheral vascular disease ([place])     dvt leg 12/14  . Hypertension   . ESRD (end stage renal disease) on dialysis ([place])     [facility] mon/wed/fri  . Atrial flutter ([place]) 04/27/2013  . S/P repair of ventral hernia 03/21/2014  . Hereditary and idiopathic peripheral neuropathy 08/29/2015  . Diverticulosis   . Hepatosplenomegaly 10/18/2015    Workup per Dr Kaplan/ GI in Dec 2015 > abd pain improved after paracentesis x 2 (1.2L, 1.3L). Had negative hepA/ hep B/ hep C testing. For hepatosplenomagealy pt underwent transjugular liver biopsy by IR with hepatic vein wedge pressure measurement which showed elevated right heart and free hepatic venous pressures likely due to right heart failure.  There was no evidence of portal hypertension. Liver bx originally was reported as "End stage liver disease/ cirrhosis", then was corrected > "No evidence of cirrhosis".  The corrected liver biopsy result reads "SINUSOIDAL DILATATION WITH SCATTERED FOCI OF HEPATITIS".  Last  GI visit was Aug 2016 for bloating/ abd pain, noted he had a normal gastric empty study.  -Right lobe liver lesion. Felt to be hemangioma, Biopsy pending.  -thrombocytopenia. Dates back to 08/2013.  -ESRD. MWF HD.  -biventricular heart failure, acute right sided heart failure. Daily HD to address volume overload.     Past Surgical History  Procedure Laterality Date  . Av fistula placement Left     Dr. Su Grand; "I've had 2 on the left' (05/03/2013)  . Av fistula placement Right ~ 2011  . Knee arthroscopy Left   . Avgg removal Right 05/04/2013    Procedure: REMOVAL OF ARTERIOVENOUS Fistula Right Arm;  Surgeon:  Elam Dutch, MD;  Location: [place];  Service: Vascular;  Laterality: Right;  . Insertion of dialysis catheter Right 05/04/2013    Procedure: INSERTION OF DIALYSIS CATHETER;  Surgeon: Elam Dutch, MD;  Location: [place];  Service: Vascular;  Laterality: Right;  . Colonoscopy  10-29-2005    Hx: of  . Bascilic vein transposition Left 06/27/2013    Procedure: BASCILIC VEIN TRANSPOSITION;  Surgeon: Elam Dutch, MD;  Location: [place];  Service: Vascular;  Laterality: Left;  . Cardioversion N/A 08/29/2013    Procedure: CARDIOVERSION;  Surgeon: Sueanne Margarita, MD;  Location: [place];  Service: Cardiovascular;  Laterality: N/A;  . Removal of a dialysis catheter  2/15  . Hernia repair  1/85/63    Umbilical hernia-Dr. Rosendo Gros  . Umbilical hernia repair N/A 03/21/2014    Procedure: LAPAROSCOPIC UMBILICAL HERNIA REPAIR WITH MESH;  Surgeon: Ralene Ok, MD;  Location: [place];  Service: General;  Laterality: N/A;  . Insertion of mesh N/A 03/21/2014    Procedure: INSERTION OF MESH;  Surgeon: Ralene Ok, MD;  Location: [place];  Service: General;  Laterality: N/A;  . Venogram Left 05/26/2013    Procedure: VENOGRAM;  Surgeon: Conrad Rutledge, MD;  Location: [facility] CATH LAB;  Service: Cardiovascular;  Laterality: Left;   Family History  Problem Relation Age of Onset  . Hypertension Father   . Kidney disease Father   . Allergies Father   . Deep vein thrombosis Sister   . Pulmonary embolism Sister   . Diabetes Paternal Grandmother    Social History  Substance Use Topics  . Smoking status: Former Smoker -- 0.25 packs/day for .5 years    Types: Cigarettes    Quit date: 09/08/1972  . Smokeless tobacco: Never Used  . Alcohol Use: No     Comment: 05/03/2013 "haven't had a glass of wine in ~ 3 months or so; sometimes will have one w/dinner"    Review of Systems  All other systems reviewed and are negative.     Allergies  Pork-derived products  Home Medications   Prior to Admission  medications   Medication Sig Start Date End Date Taking? Authorizing Provider  B Complex-C (B-COMPLEX WITH VITAMIN C) tablet Take 1 tablet by mouth daily.   Yes Historical Provider, MD  labetalol (NORMODYNE) 200 MG tablet Take 200 mg by mouth daily.    Yes Historical Provider, MD  Nutritional Supplements (FEEDING SUPPLEMENT, NEPRO CARB STEADY,) LIQD Take 237 mLs by mouth 2 (two) times daily between meals. 10/19/15  Yes Nishant Dhungel, MD  oxyCODONE-acetaminophen (ROXICET) 5-325 MG tablet Take 2 tablets by mouth every 6 (six) hours as needed for severe pain. 10/19/15  Yes Nishant Dhungel, MD  pantoprazole (PROTONIX) 40 MG tablet Take 1 tablet (40 mg total) by mouth daily. 10/19/15  Yes Nishant  Dhungel, MD  sevelamer carbonate (RENVELA) 800 MG tablet Take 4 tablets (3,200 mg total) by mouth 3 (three) times daily with meals. Patient taking differently: Take 2,400 mg by mouth 3 (three) times daily with meals.  09/12/14  Yes Marianne L York, PA-C  Na Sulfate-K Sulfate-Mg Sulf SOLN Take 1 kit by mouth as directed. Patient not taking: Reported on 10/26/2015 10/25/15 11/24/15  Cecille Rubin P Hvozdovic, PA-C  pantoprazole (PROTONIX) 40 MG tablet Take 1 tablet (40 mg total) by mouth daily. 30 mins prior to breakfast. Patient not taking: Reported on 10/26/2015 10/25/15   Lori P Hvozdovic, PA-C   BP 100/80 mmHg  Pulse 57  Temp(Src) 97.7 F (36.5 C) (Oral)  Resp 9  SpO2 94% Physical Exam  Constitutional: He is oriented to person, place, and time. He appears well-developed and well-nourished.  HENT:  Head: Normocephalic and atraumatic.  Right Ear: External ear normal.  Left Ear: External ear normal.  Eyes: Conjunctivae and EOM are normal. Pupils are equal, round, and reactive to light.  Neck: Normal range of motion and phonation normal. Neck supple.  Cardiovascular: Normal rate, regular rhythm and normal heart sounds.   Vascular fistula left upper arm, with normal pulsation. No swelling or redness over the site.   Pulmonary/Chest: Effort normal and breath sounds normal. He exhibits no bony tenderness.  Abdominal: Soft. There is no rebound.  Moderate upper abdominal tenderness, with guarding and mild distention in the upper abdomen only.  Musculoskeletal: Normal range of motion.  Neurological: He is alert and oriented to person, place, and time. No cranial nerve deficit or sensory deficit. He exhibits normal muscle tone. Coordination normal.  Skin: Skin is warm, dry and intact.  Psychiatric: He has a normal mood and affect. His behavior is normal. Judgment and thought content normal.  Nursing note and vitals reviewed.   ED Course  Procedures (including critical care time) Medications  oxyCODONE-acetaminophen (PERCOCET/ROXICET) 5-325 MG per tablet 2 tablet (not administered)  sodium chloride 0.9 % bolus 500 mL (500 mLs Intravenous New Bag/Given 10/26/15 1128)    Patient Vitals for the past 24 hrs:  BP Temp Temp src Pulse Resp SpO2  10/26/15 1330 100/80 mmHg - - (!) 57 - 94 %  10/26/15 1300 107/80 mmHg - - 65 - 94 %  10/26/15 1230 117/85 mmHg - - 69 - 91 %  10/26/15 1130 128/98 mmHg - - 61 - 93 %  10/26/15 1100 131/86 mmHg - - 71 - 100 %  10/26/15 1018 102/69 mmHg 97.7 F (36.5 C) Oral (!) 50 (!) 9 94 %  10/26/15 1015 102/69 mmHg - - 66 14 94 %    1:47 PM Reevaluation with update and discussion. After initial assessment and treatment, an updated evaluation reveals he is sitting up and states he has abdominal pain, now and needs pain medicine. Is been offered lunch, but not yet eaten. It. Daleen Bo L   13:50- consult nephrology regarding missed dialysis, and elevated potassium level. Dr. pulse states that the patient can go home, call his dialysis center and arrange for dialysis as an outpatient tomorrow.  Labs Review Labs Reviewed  BASIC METABOLIC PANEL - Abnormal; Notable for the following:    Potassium 5.3 (*)    BUN 51 (*)    Creatinine, Ser 8.06 (*)    GFR calc non Af Amer 6 (*)     GFR calc Af Amer 7 (*)    All other components within normal limits  CBC - Abnormal; Notable for the  following:    WBC 2.5 (*)    RBC 4.05 (*)    Hemoglobin 11.4 (*)    HCT 34.4 (*)    RDW 16.8 (*)    Platelets 61 (*)    All other components within normal limits  TROPONIN I  CBG MONITORING, ED    Imaging Review Dg Chest Port 1 View  10/26/2015  CLINICAL DATA:  Syncope. EXAM: PORTABLE CHEST 1 VIEW COMPARISON:  September 07, 2014. FINDINGS: Stable cardiomegaly. No pneumothorax or pleural effusion is noted. No acute pulmonary disease is noted. Stable mild bibasilar scarring. Bony thorax is unremarkable. IMPRESSION: No acute cardiopulmonary abnormality seen. Electronically Signed   By: Marijo Conception, M.D.   On: 10/26/2015 11:16   I have personally reviewed and evaluated these images and lab results as part of my medical decision-making.   EKG Interpretation   Date/Time:  Friday October 26 2015 10:15:23 EST Ventricular Rate:  53 PR Interval:    QRS Duration: 114 QT Interval:  576 QTC Calculation: 541 R Axis:   120 Text Interpretation:  Atrial fibrillation Borderline intraventricular  conduction delay Borderline repolarization abnormality Prolonged QT  interval Since last tracing QT has lengthened Confirmed by Eulis Foster  MD,  Elbert Polyakov ([id number]) on 10/26/2015 11:20:01 AM      MDM   Final diagnoses:  Syncope, unspecified syncope type  Pain of upper abdomen  Hyperkalemia    Syncope without persistent cardiac or pulmonary problems. Ongoing abdominal pain with use of narcotics for that. Mild hyperkalemia, without evidence for ongoing cardiac arrhythmia. QT on EKG is only marginally prolonged from his baseline.  Nursing Notes Reviewed/ Care Coordinated Applicable Imaging Reviewed Interpretation of Laboratory Data incorporated into ED treatment  The patient appears reasonably screened and/or stabilized for discharge and I doubt any other medical condition or other [facility] requiring  further screening, evaluation, or treatment in the ED at this time prior to discharge.  Plan: Home Medications- usual; Home Treatments- rest; return here if the recommended treatment, does not improve the symptoms; Recommended follow up- dialysis as soon as possible, and primary care provider and GI, as needed.     Daleen Bo, MD 10/26/15 [phone]

## 2015-10-26 NOTE — Progress Notes (Signed)
Reviewed and agree with documentation and assessment and plan. K. Veena Nandigam , MD   

## 2015-10-27 DIAGNOSIS — N186 End stage renal disease: Secondary | ICD-10-CM | POA: Diagnosis not present

## 2015-10-27 DIAGNOSIS — N2581 Secondary hyperparathyroidism of renal origin: Secondary | ICD-10-CM | POA: Diagnosis not present

## 2015-10-27 DIAGNOSIS — D631 Anemia in chronic kidney disease: Secondary | ICD-10-CM | POA: Diagnosis not present

## 2015-10-29 DIAGNOSIS — N186 End stage renal disease: Secondary | ICD-10-CM | POA: Diagnosis not present

## 2015-10-29 DIAGNOSIS — D631 Anemia in chronic kidney disease: Secondary | ICD-10-CM | POA: Diagnosis not present

## 2015-10-29 DIAGNOSIS — D72819 Decreased white blood cell count, unspecified: Secondary | ICD-10-CM | POA: Diagnosis not present

## 2015-10-29 DIAGNOSIS — R109 Unspecified abdominal pain: Secondary | ICD-10-CM | POA: Diagnosis not present

## 2015-10-29 DIAGNOSIS — N2581 Secondary hyperparathyroidism of renal origin: Secondary | ICD-10-CM | POA: Diagnosis not present

## 2015-10-29 DIAGNOSIS — I129 Hypertensive chronic kidney disease with stage 1 through stage 4 chronic kidney disease, or unspecified chronic kidney disease: Secondary | ICD-10-CM | POA: Diagnosis not present

## 2015-10-29 DIAGNOSIS — Z992 Dependence on renal dialysis: Secondary | ICD-10-CM | POA: Diagnosis not present

## 2015-10-30 ENCOUNTER — Encounter: Payer: Self-pay | Admitting: Physician Assistant

## 2015-10-30 ENCOUNTER — Telehealth: Payer: Self-pay | Admitting: Physician Assistant

## 2015-10-30 ENCOUNTER — Ambulatory Visit (INDEPENDENT_AMBULATORY_CARE_PROVIDER_SITE_OTHER): Payer: Medicare Other | Admitting: Physician Assistant

## 2015-10-30 VITALS — BP 130/80 | HR 96 | Ht 70.47 in | Wt 214.0 lb

## 2015-10-30 DIAGNOSIS — I420 Dilated cardiomyopathy: Secondary | ICD-10-CM | POA: Diagnosis not present

## 2015-10-30 DIAGNOSIS — R011 Cardiac murmur, unspecified: Secondary | ICD-10-CM

## 2015-10-30 DIAGNOSIS — R55 Syncope and collapse: Secondary | ICD-10-CM

## 2015-10-30 DIAGNOSIS — I5042 Chronic combined systolic (congestive) and diastolic (congestive) heart failure: Secondary | ICD-10-CM

## 2015-10-30 DIAGNOSIS — I4892 Unspecified atrial flutter: Secondary | ICD-10-CM | POA: Diagnosis not present

## 2015-10-30 DIAGNOSIS — R109 Unspecified abdominal pain: Secondary | ICD-10-CM

## 2015-10-30 NOTE — Assessment & Plan Note (Signed)
Rate is controlled with labetalol. He has refused Coumadin after a GI bleed.

## 2015-10-30 NOTE — Assessment & Plan Note (Signed)
Heart failure is compensated. 

## 2015-10-30 NOTE — Assessment & Plan Note (Signed)
Patient is stable without evidence of heart failure. We'll repeat 2-D echo to reevaluate LV function and heart murmurs

## 2015-10-30 NOTE — Patient Instructions (Signed)
Medication Instructions:  None  Labwork: None  Testing/Procedures: Your physician has requested that you have an echocardiogram. Echocardiography is a painless test that uses sound waves to create images of your heart. It provides your doctor with information about the size and shape of your heart and how well your heart's chambers and valves are working. This procedure takes approximately one hour. There are no restrictions for this procedure.   Follow-Up: Your physician recommends that you schedule a follow-up appointment in: 2 months with [name].    Any Other Special Instructions Will Be Listed Below (If Applicable).     If you need a refill on your cardiac medications before your next appointment, please call your pharmacy.

## 2015-10-30 NOTE — Progress Notes (Signed)
Cardiology Office Note   Date:  10/30/2015   ID:  ROSA GUILBAULT, DOB 01-03-1951, MRN [id number]  PCP:  [name], MD  Cardiologist: [name]   Chief Complaint: Post hospital follow-up    History of Present Illness: Trevor [name] is a 65 y.o. male who presents for post hospital follow-up. He was seen in the hospital by Dr. [place] for volume overload in the setting of severe abdominal pain. He has chronic atrial fibrillation and has not interested in taking Coumadin because of previous hematoma. He has history of hypertension, ESRD on hemodialysis, syncope felt secondary to A. fib with RVR in 2014. 2-D echo in 2015 showed EF of 25-30%. Volume overload was treated with dialysis. Patient continued with ongoing abdominal pain and was scheduled for colonoscopy and ask up he.  He returned to the emergency room 10/26/15 with a syncopal episode that lasted less than 1 minute. He got up to get a drink, walked to bedroom, sat on bed and then passed out. He had skipped dialysis that day and in the ER he was hyperkalemic with a potassium of 5.3 without any evidence of cardiac arrhythmias, troponins negative. EKG showed atrial fibrillation with controlled rate and nonspecific ST-T wave changes, no acute change He was taking a lot of pain medications for ongoing abdominal pain. He was discharged to have dialysis the next day.    Past Medical History  Diagnosis Date  . Anemia   . AVF (arteriovenous fistula) (HCC)     Left  . Secondary hyperparathyroidism (HCC)   . Hypovitaminosis D   . Hypertensive urgency     H/o  . CHF (congestive heart failure) (HCC)     EF 20-25%  . Exertional shortness of breath     "related to infection in my lungs right now" (05/03/2013)  . History of gout     "before I started doing the dialysis" (05/03/2013)  . Sleep apnea   . GERD (gastroesophageal reflux disease)   . Syncope     felt secondary to residual anesthesia the day before - 2D echo unremarkable  .  Pulmonary embolism (HCC)     with right DVT secondary to recent surgery  . Atrial fibrillation (HCC)     not on coumadin due to large spontaneous hematoma on back and anemia  . Myocardial infarction (HCC) 90's  . Coronary artery disease   . Dysrhythmia     afib  . Peripheral vascular disease (HCC)     dvt leg 12/14  . Hypertension   . ESRD (end stage renal disease) on dialysis (H[facility])     [facility] mon/wed/fri  . Atrial flutter (HCC) 04/27/2013  . S/P repair of ventral hernia 03/21/2014  . Hereditary and idiopathic peripheral neuropathy 08/29/2015  . Diverticulosis   . Hepatosplenomegaly 10/18/2015    Workup per Dr Kaplan/ GI in Dec 2015 > abd pain improved after paracentesis x 2 (1.2L, 1.3L). Had negative hepA/ hep B/ hep C testing. For hepatosplenomagealy pt underwent transjugular liver biopsy by IR with hepatic vein wedge pressure measurement which showed elevated right heart and free hepatic venous pressures likely due to right heart failure.  There was no evidence of portal hypertension. Liver bx originally was reported as "End stage liver disease/ cirrhosis", then was corrected > "No evidence of cirrhosis".  The corrected liver biopsy result reads "SINUSOIDAL DILATATION WITH SCATTERED FOCI OF HEPATITIS".  Last GI visit was Aug 2016 for bloating/ abd pain, noted he had a normal gastric empty  study.  -Right lobe liver lesion. Felt to be hemangioma, Biopsy pending.  -thrombocytopenia. Dates back to 08/2013.  -ESRD. MWF HD.  -biventricular heart failure, acute right sided heart failure. Daily HD to address volume overload.      Past Surgical History  Procedure Laterality Date  . Av fistula placement Left     Dr. Charlean Sanfilippo; "I've had 2 on the left' (05/03/2013)  . Av fistula placement Right ~ 2011  . Knee arthroscopy Left   . Avgg removal Right 05/04/2013    Procedure: REMOVAL OF ARTERIOVENOUS Fistula Right Arm;  Surgeon: [name], MD;  [facility];  Service: Vascular;  Laterality:  Right;  . Insertion of dialysis catheter Right 05/04/2013    Procedure: INSERTION OF DIALYSIS CATHETER;  Surgeon: [name], MD;  Location: [facility] OR;  Service: Vascular;  Laterality: Right;  . Colonoscopy  10-29-2005    Hx: of  . Bascilic vein transposition Left 06/27/2013    Procedure: BASCILIC VEIN TRANSPOSITION;  Surgeon: [name], MD;  Location: [facility] OR;  Service: Vascular;  Laterality: Left;  . Cardioversion N/A 08/29/2013    Procedure: CARDIOVERSION;  Surgeon: [name], MD;  Location: [facility] ENDOSCOPY;  Service: Cardiovascular;  Laterality: N/A;  . Removal of a dialysis catheter  2/15  . Hernia repair  03/21/14    Umbilical hernia-Dr. Derrell Lolling  . Umbilical hernia repair N/A 03/21/2014    Procedure: LAPAROSCOPIC UMBILICAL HERNIA REPAIR WITH MESH;  Surgeon: Axel Filler, MD;  Location: [facility] OR;  Service: General;  Laterality: N/A;  . Insertion of mesh N/A 03/21/2014    Procedure: INSERTION OF MESH;  Surgeon: Axel Filler, MD;  Location[facility] OR;  Service: General;  Laterality: N/A;  . Venogram Left 05/26/2013    Procedure: VENOGRAM;  Surgeon: Fransisco Hertz, MD;  Location: [facility] CATH LAB;  Service: Cardiovascular;  Laterality: Left;     Current Outpatient Prescriptions  Medication Sig Dispense Refill  . B Complex-C (B-COMPLEX WITH VITAMIN C) tablet Take 1 tablet by mouth daily.    Marland Kitchen labetalol (NORMODYNE) 200 MG tablet Take 200 mg by mouth daily.     . Nutritional Supplements (FEEDING SUPPLEMENT, NEPRO CARB STEADY,) LIQD Take 237 mLs by mouth 2 (two) times daily between meals. 60 Can 0  . oxyCODONE-acetaminophen (ROXICET) 5-325 MG tablet Take 2 tablets by mouth every 6 (six) hours as needed for severe pain. 30 tablet 0  . pantoprazole (PROTONIX) 40 MG tablet Take 1 tablet (40 mg total) by mouth daily. 30 tablet 0  . sevelamer carbonate (RENVELA) 800 MG tablet Take 4 tablets (3,200 mg total) by mouth 3 (three) times daily with meals. (Patient taking differently: Take 2,400 mg by mouth  3 (three) times daily with meals. ) 360 tablet 3   No current facility-administered medications for this visit.    Allergies:   Pork-derived products    Social History:  The patient  reports that he quit smoking about 43 years ago. His smoking use included Cigarettes. He has a .125 pack-year smoking history. He has never used smokeless tobacco. He reports that he does not drink alcohol or use illicit drugs.   Family History:  The patient's    family history includes Allergies in his father; Deep vein thrombosis in his sister; Diabetes in his paternal grandmother; Hypertension in his father; Kidney disease in his father; Pulmonary embolism in his sister.    ROS:  Please see the history of present illness.   Otherwise, review of systems  are positive for ongoing abdominal pain awaiting scheduling of colonoscopy and endoscopy.   All other systems are reviewed and negative.    PHYSICAL EXAM: VS:  BP 130/80 mmHg  Pulse 96  Ht 5' 10.47" (1.79 m)  Wt 214 lb (97.07 kg)  BMI 30.30 kg/m2 , BMI Body mass index is 30.3 kg/(m^2). GEN: Well nourished, well developed, in no acute distress Neck: Increased JVD, HJR, no carotid bruits, or masses Cardiac: Irregular irregular with 2/6 harsh systolic murmur at the left sternal border, 1/6 diastolic murmur at the left sternal border, no gallop, rubs, thrill or heave,  Respiratory:  clear to auscultation bilaterally, normal work of breathing GI: soft, nontender, nondistended, + BS MS: no deformity or atrophy Extremities: without cyanosis, clubbing, edema, good distal pulses bilaterally.  Skin: warm and dry, no rash Neuro:  Strength and sensation are intact    EKG:  EKG is ordered today. The ekg ordered today demonstrates atrial flutter at 83 bpm nonspecific ST-T wave changes, no acute change Recent Labs: 10/14/2015: ALT 16* 10/26/2015: BUN 51*; Creatinine, Ser 8.06*; Hemoglobin 11.4*; Platelets 61*; Potassium 5.3*; Sodium 138    Lipid Panel     Component Value Date/Time   CHOL 83 03/07/2014 1530   TRIG 49.0 03/07/2014 1530   HDL 54.70 03/07/2014 1530   CHOLHDL 2 03/07/2014 1530   VLDL 9.8 03/07/2014 1530   LDLCALC 19 03/07/2014 1530      Wt Readings from Last 3 Encounters:  10/30/15 214 lb (97.07 kg)  10/25/15 214 lb 2 oz (97.126 kg)  10/19/15 198 lb 13.7 oz (90.2 kg)      Other studies Reviewed: Additional studies/ records that were reviewed today include and review of the records demonstrates:  2-D echo 2015 Study Conclusions  - Left ventricle: The cavity size was moderately dilated. Wall   thickness was increased in a pattern of moderate LVH. Systolic   function was severely reduced. The estimated ejection fraction   was in the range of 25% to 30%. Diffuse hypokinesis. Akinesis of   the anteroseptal myocardium. - Ventricular septum: The contour showed diastolic flattening. - Aortic valve: There was mild regurgitation. - Mitral valve: There was mild regurgitation. - Left atrium: The atrium was severely dilated. - Right ventricle: The cavity size was severely dilated. Systolic   function was mildly to moderately reduced. - Right atrium: The atrium was massively dilated. - Tricuspid valve: There was severe regurgitation. - Pulmonary arteries: Systolic pressure was moderately to severely   increased. PA peak pressure: 66 mm Hg (S).     ASSESSMENT AND PLAN: Chronic atrial fibrillation (HCC) Rate is controlled with labetalol. He has refused Coumadin after a GI bleed.  Syncope She has syncopal episode 2/17/[facility] team prompting emergency room visit reviewed his blood pressures were low but he is not orthostatic in the office today. He does have some systolic heart murmurs and last echo was done in 2015 EF 25-30%. We'll repeat 2-D echo and have him follow-up with [name]  DCM- EF 25-30% by echo 09/04/14 Patient is stable without evidence of heart failure. We'll repeat 2-D echo to reevaluate LV function and  heart murmurs  Chronic combined systolic and diastolic heart failure (HCC) Heart failure is compensated  Intractable abdominal pain To be scheduled for endoscopy and colonoscopy. I've asked him to contact GI to verify scheduling.     Elson Clan, PA-C  10/30/2015 10:21 AM    [facility] [address] [place], [place], [place]  [address] Phone: [phone]; Fax: [phone]

## 2015-10-30 NOTE — Assessment & Plan Note (Signed)
She has syncopal episode 2/17/[facility] team prompting emergency room visit reviewed his blood pressures were low but he is not orthostatic in the office today. He does have some systolic heart murmurs and last echo was done in 2015 EF 25-30%. We'll repeat 2-D echo and have him follow-up with [name]

## 2015-10-30 NOTE — Assessment & Plan Note (Signed)
To be scheduled for endoscopy and colonoscopy. I've asked him to contact GI to verify scheduling.

## 2015-10-31 ENCOUNTER — Emergency Department (HOSPITAL_COMMUNITY)
Admission: EM | Admit: 2015-10-31 | Discharge: 2015-10-31 | Disposition: A | Discharge status: Home / Self Care | Payer: Medicare Other | Attending: Emergency Medicine | Admitting: Emergency Medicine

## 2015-10-31 ENCOUNTER — Emergency Department (HOSPITAL_COMMUNITY): Payer: Medicare Other

## 2015-10-31 ENCOUNTER — Encounter (HOSPITAL_COMMUNITY): Payer: Self-pay | Admitting: *Deleted

## 2015-10-31 ENCOUNTER — Other Ambulatory Visit: Payer: Self-pay | Admitting: *Deleted

## 2015-10-31 DIAGNOSIS — Z8639 Personal history of other endocrine, nutritional and metabolic disease: Secondary | ICD-10-CM | POA: Diagnosis not present

## 2015-10-31 DIAGNOSIS — Z8739 Personal history of other diseases of the musculoskeletal system and connective tissue: Secondary | ICD-10-CM | POA: Diagnosis not present

## 2015-10-31 DIAGNOSIS — Z862 Personal history of diseases of the blood and blood-forming organs and certain disorders involving the immune mechanism: Secondary | ICD-10-CM | POA: Diagnosis not present

## 2015-10-31 DIAGNOSIS — I2510 Atherosclerotic heart disease of native coronary artery without angina pectoris: Secondary | ICD-10-CM | POA: Diagnosis not present

## 2015-10-31 DIAGNOSIS — Z992 Dependence on renal dialysis: Secondary | ICD-10-CM | POA: Diagnosis not present

## 2015-10-31 DIAGNOSIS — I120 Hypertensive chronic kidney disease with stage 5 chronic kidney disease or end stage renal disease: Secondary | ICD-10-CM | POA: Diagnosis not present

## 2015-10-31 DIAGNOSIS — N2581 Secondary hyperparathyroidism of renal origin: Secondary | ICD-10-CM | POA: Diagnosis not present

## 2015-10-31 DIAGNOSIS — R110 Nausea: Secondary | ICD-10-CM | POA: Insufficient documentation

## 2015-10-31 DIAGNOSIS — R05 Cough: Secondary | ICD-10-CM | POA: Diagnosis not present

## 2015-10-31 DIAGNOSIS — Z86711 Personal history of pulmonary embolism: Secondary | ICD-10-CM | POA: Diagnosis not present

## 2015-10-31 DIAGNOSIS — D631 Anemia in chronic kidney disease: Secondary | ICD-10-CM | POA: Diagnosis not present

## 2015-10-31 DIAGNOSIS — I252 Old myocardial infarction: Secondary | ICD-10-CM | POA: Diagnosis not present

## 2015-10-31 DIAGNOSIS — Z87891 Personal history of nicotine dependence: Secondary | ICD-10-CM | POA: Diagnosis not present

## 2015-10-31 DIAGNOSIS — I509 Heart failure, unspecified: Secondary | ICD-10-CM | POA: Diagnosis not present

## 2015-10-31 DIAGNOSIS — Z79899 Other long term (current) drug therapy: Secondary | ICD-10-CM | POA: Diagnosis not present

## 2015-10-31 DIAGNOSIS — R1084 Generalized abdominal pain: Secondary | ICD-10-CM | POA: Diagnosis not present

## 2015-10-31 DIAGNOSIS — N186 End stage renal disease: Secondary | ICD-10-CM | POA: Insufficient documentation

## 2015-10-31 DIAGNOSIS — I517 Cardiomegaly: Secondary | ICD-10-CM | POA: Diagnosis not present

## 2015-10-31 DIAGNOSIS — R079 Chest pain, unspecified: Secondary | ICD-10-CM | POA: Diagnosis not present

## 2015-10-31 DIAGNOSIS — K219 Gastro-esophageal reflux disease without esophagitis: Secondary | ICD-10-CM | POA: Insufficient documentation

## 2015-10-31 LAB — I-STAT TROPONIN, ED: Troponin i, poc: 0.03 ng/mL (ref 0.00–0.08)

## 2015-10-31 LAB — BASIC METABOLIC PANEL
ANION GAP: 16 — AB (ref 5–15)
BUN: 20 mg/dL (ref 6–20)
CO2: 25 mmol/L (ref 22–32)
CREATININE: 5.21 mg/dL — AB (ref 0.61–1.24)
Calcium: 9.2 mg/dL (ref 8.9–10.3)
Chloride: 95 mmol/L — ABNORMAL LOW (ref 101–111)
GFR, EST AFRICAN AMERICAN: 12 mL/min — AB (ref >60)
GFR, EST NON AFRICAN AMERICAN: 11 mL/min — AB (ref >60)
Glucose, Bld: 66 mg/dL (ref 65–99)
Potassium: 4.1 mmol/L (ref 3.5–5.1)
SODIUM: 136 mmol/L (ref 135–145)

## 2015-10-31 LAB — CBC
HCT: 32.9 % — ABNORMAL LOW (ref 39.0–52.0)
Hemoglobin: 11.2 g/dL — ABNORMAL LOW (ref 13.0–17.0)
MCH: 29.4 pg (ref 26.0–34.0)
MCHC: 34 g/dL (ref 30.0–36.0)
MCV: 86.4 fL (ref 78.0–100.0)
Platelets: 109 10*3/uL — ABNORMAL LOW (ref 150–400)
RBC: 3.81 MIL/uL — AB (ref 4.22–5.81)
RDW: 16.9 % — ABNORMAL HIGH (ref 11.5–15.5)
WBC: 2.4 10*3/uL — AB (ref 4.0–10.5)

## 2015-10-31 MED ORDER — FENTANYL CITRATE (PF) 100 MCG/2ML IJ SOLN: 50.0000 ug | INTRAMUSCULAR | Status: DC | PRN

## 2015-10-31 MED ORDER — ONDANSETRON HCL 4 MG/2ML IJ SOLN
4.0000 mg | Freq: Once | INTRAMUSCULAR | Status: DC
  Filled 2015-10-31: qty 2

## 2015-10-31 MED ORDER — GLYCOPYRROLATE 0.2 MG/ML IJ SOLN
0.2000 mg | Freq: Once | INTRAMUSCULAR | Status: DC
  Filled 2015-10-31: qty 1

## 2015-10-31 MED ORDER — HYDROMORPHONE HCL 1 MG/ML IJ SOLN
1.0000 mg | INTRAMUSCULAR | Status: DC | PRN
  Filled 2015-10-31: qty 1

## 2015-10-31 NOTE — ED Notes (Signed)
Pt arrives to ED c/o cough, chest pain, nausea, weakness. States he was in the hospital earlier this month for the same. Dialysis MWF. States he went this morning but wasn't able to get fluid off because of cramping. States that this has been a recurrent issue.

## 2015-10-31 NOTE — ED Notes (Signed)
Pt refused to sign states he did not want to be discharge but wanted to see another Md; Pt was advised that there is no other Md to available to see him; Pt left with out  Excepting paperwork

## 2015-10-31 NOTE — ED Notes (Signed)
RN attempted to give med and prep for CT; Pt is fully dressed and was upset; pt demanded to see a different Md; Pt states he knows what is going on with his body and Md is telling him different; Consulting civil engineer and Md notified; Md at bedside speaking with patient at this time

## 2015-10-31 NOTE — Discharge Instructions (Signed)
Follow-up with your regular physicians.  Continue your medications as prescribed by your physicians at your most recent discharge

## 2015-10-31 NOTE — Telephone Encounter (Signed)
Left message to call office

## 2015-10-31 NOTE — ED Notes (Signed)
MD at bedside. 

## 2015-11-01 NOTE — ED Provider Notes (Signed)
CSN: [id number]     Arrival date & time 10/31/15  1534 History   First MD Initiated Contact with Patient 10/31/15 1927     Chief Complaint  Patient presents with  . Chest Pain  . Hypertension  . Nausea  . Cough      HPI  Patient presents for evaluation of chest pain and abdominal pain and difficulty tolerating hemodialysis.  Patient has had recent evaluations and admissions for abdominal pain. He is an end-stage renal disease patient on 3 times a week hemodialysis. He reports abdominal pain for which she was recently admitted and underwent thorough evaluation. Per his gastroenterologist report is a high likelihood of "functional pain". Has had recommendation to have follow-up with GI for outpatient endoscopies although he's had negative studies before. He states that he is taking pain medicine at home. However, he went to dialysis today, he did not take any pain medicine, he states that "I had pain so bad it was paralyzed". Presents here complaining of abdominal pain and chest pain. He ambulates to the room without difficulty.  Past Medical History  Diagnosis Date  . Anemia   . AVF (arteriovenous fistula) (HCC)     Left  . Secondary hyperparathyroidism (HCC)   . Hypovitaminosis D   . Hypertensive urgency     H/o  . CHF (congestive heart failure) (HCC)     EF 20-25%  . Exertional shortness of breath     "related to infection in my lungs right now" (05/03/2013)  . History of gout     "before I started doing the dialysis" (05/03/2013)  . Sleep apnea   . GERD (gastroesophageal reflux disease)   . Syncope     felt secondary to residual anesthesia the day before - 2D echo unremarkable  . Pulmonary embolism (HCC)     with right DVT secondary to recent surgery  . Atrial fibrillation (HCC)     not on coumadin due to large spontaneous hematoma on back and anemia  . Myocardial infarction (HCC) 90's  . Coronary artery disease   . Dysrhythmia     afib  . Peripheral vascular disease  (HCC)     dvt leg 12/14  . Hypertension   . ESRD (end stage renal disease) on dialysis (HCC)     [facility] mon/wed/fri  . Atrial flutter (HCC) 04/27/2013  . S/P repair of ventral hernia 03/21/2014  . Hereditary and idiopathic peripheral neuropathy 08/29/2015  . Diverticulosis   . Hepatosplenomegaly 10/18/2015    Workup per Dr Kaplan/ GI in Dec 2015 > abd pain improved after paracentesis x 2 (1.2L, 1.3L). Had negative hepA/ hep B/ hep C testing. For hepatosplenomagealy pt underwent transjugular liver biopsy by IR with hepatic vein wedge pressure measurement which showed elevated right heart and free hepatic venous pressures likely due to right heart failure.  There was no evidence of portal hypertension. Liver bx originally was reported as "End stage liver disease/ cirrhosis", then was corrected > "No evidence of cirrhosis".  The corrected liver biopsy result reads "SINUSOIDAL DILATATION WITH SCATTERED FOCI OF HEPATITIS".  Last GI visit was Aug 2016 for bloating/ abd pain, noted he had a normal gastric empty study.  -Right lobe liver lesion. Felt to be hemangioma, Biopsy pending.  -thrombocytopenia. Dates back to 08/2013.  -ESRD. MWF HD.  -biventricular heart failure, acute right sided heart failure. Daily HD to address volume overload.     Past Surgical History  Procedure Laterality Date  . Av fistula placement Left  Dr. Charlean Sanfilippo; "I've had 2 on the left' (05/03/2013)  . Av fistula placement Right ~ 2011  . Knee arthroscopy Left   . Avgg removal Right 05/04/2013    Procedure: REMOVAL OF ARTERIOVENOUS Fistula Right Arm;  Surgeon: [name], MD;  Location: [facility] OR;  Service: Vascular;  Laterality: Right;  . Insertion of dialysis catheter Right 05/04/2013    Procedure: INSERTION OF DIALYSIS CATHETER;  Surgeon: [name], MD;  Location: [facility] OR;  Service: Vascular;  Laterality: Right;  . Colonoscopy  10-29-2005    Hx: of  . Bascilic vein transposition Left 06/27/2013    Procedure: BASCILIC  VEIN TRANSPOSITION;  Surgeon: [name], MD;  Location: [facility] OR;  Service: Vascular;  Laterality: Left;  . Cardioversion N/A 08/29/2013    Procedure: CARDIOVERSION;  Surgeon: [name], MD;  Location: [facility] ENDOSCOPY;  Service: Cardiovascular;  Laterality: N/A;  . Removal of a dialysis catheter  2/15  . Hernia repair  03/21/14    Umbilical hernia-Dr. Derrell Lolling  . Umbilical hernia repair N/A 03/21/2014    Procedure: LAPAROSCOPIC UMBILICAL HERNIA REPAIR WITH MESH;  Surgeon: Axel Filler, MD;  Location: [facility] OR;  Service: General;  Laterality: N/A;  . Insertion of mesh N/A 03/21/2014    Procedure: INSERTION OF MESH;  Surgeon: Axel Filler, MD;  Location: [facility] OR;  Service: General;  Laterality: N/A;  . Venogram Left 05/26/2013    Procedure: VENOGRAM;  Surgeon: Fransisco Hertz, MD;  Location: [facility] CATH LAB;  Service: Cardiovascular;  Laterality: Left;   Family History  Problem Relation Age of Onset  . Hypertension Father   . Kidney disease Father   . Allergies Father   . Deep vein thrombosis Sister   . Pulmonary embolism Sister   . Diabetes Paternal Grandmother    Social History  Substance Use Topics  . Smoking status: Former Smoker -- 0.25 packs/day for .5 years    Types: Cigarettes    Quit date: 09/08/1972  . Smokeless tobacco: Never Used  . Alcohol Use: No     Comment: 05/03/2013 "haven't had a glass of wine in ~ 3 months or so; sometimes will have one w/dinner"    Review of Systems  Constitutional: Negative for fever, chills, diaphoresis, appetite change and fatigue.  HENT: Negative for mouth sores, sore throat and trouble swallowing.   Eyes: Negative for visual disturbance.  Respiratory: Negative for cough, chest tightness, shortness of breath and wheezing.   Cardiovascular: Positive for chest pain.  Gastrointestinal: Positive for abdominal pain. Negative for nausea, vomiting, diarrhea and abdominal distention.  Endocrine: Negative for polydipsia, polyphagia and polyuria.    Genitourinary: Negative for dysuria, frequency and hematuria.  Musculoskeletal: Negative for gait problem.  Skin: Negative for color change, pallor and rash.  Neurological: Negative for dizziness, syncope, light-headedness and headaches.  Hematological: Does not bruise/bleed easily.  Psychiatric/Behavioral: Negative for behavioral problems and confusion.      Allergies  Pork-derived products  Home Medications   Prior to Admission medications   Medication Sig Start Date End Date Taking? Authorizing Provider  B Complex-C (B-COMPLEX WITH VITAMIN C) tablet Take 1 tablet by mouth daily.   Yes Historical Provider, MD  labetalol (NORMODYNE) 200 MG tablet Take 200 mg by mouth daily.    Yes Historical Provider, MD  Nutritional Supplements (FEEDING SUPPLEMENT, NEPRO CARB STEADY,) LIQD Take 237 mLs by mouth 2 (two) times daily between meals. 10/19/15  Yes Nishant Dhungel, MD  oxyCODONE-acetaminophen (ROXICET) 5-325 MG tablet Take 2 tablets  by mouth every 6 (six) hours as needed for severe pain. 10/19/15  Yes Nishant Dhungel, MD  pantoprazole (PROTONIX) 40 MG tablet Take 1 tablet (40 mg total) by mouth daily. 10/19/15  Yes Nishant Dhungel, MD  sevelamer carbonate (RENVELA) 800 MG tablet Take 4 tablets (3,200 mg total) by mouth 3 (three) times daily with meals. Patient taking differently: Take 2,400 mg by mouth 3 (three) times daily with meals.  09/12/14  Yes Marianne L York, PA-C   BP 137/101 mmHg  Pulse 74  Temp(Src) 98.7 F (37.1 C) (Oral)  Resp 19  SpO2 97% Physical Exam  Constitutional: He is oriented to person, place, and time. He appears well-developed and well-nourished. No distress.  HENT:  Head: Normocephalic.  Eyes: Conjunctivae are normal. Pupils are equal, round, and reactive to light. No scleral icterus.  Neck: Normal range of motion. Neck supple. No thyromegaly present.  Cardiovascular: Normal rate and regular rhythm.  Exam reveals no gallop and no friction rub.   No murmur  heard. Pulmonary/Chest: Effort normal and breath sounds normal. No respiratory distress. He has no wheezes. He has no rales.  Clear bilateral breath sounds. No wheezing rales or rhonchi.  Abdominal: Soft. Bowel sounds are normal. He exhibits no distension. There is no tenderness. There is no rebound.  Soft benign abdomen. He complains of global pain. Has no reproducible tenderness or peritoneal irritation.  Musculoskeletal: Normal range of motion.  Neurological: He is alert and oriented to person, place, and time.  Skin: Skin is warm and dry. No rash noted.     Psychiatric: He has a normal mood and affect. His behavior is normal.    ED Course  Procedures (including critical care time) Labs Review Labs Reviewed  BASIC METABOLIC PANEL - Abnormal; Notable for the following:    Chloride 95 (*)    Creatinine, Ser 5.21 (*)    GFR calc non Af Amer 11 (*)    GFR calc Af Amer 12 (*)    Anion gap 16 (*)    All other components within normal limits  CBC - Abnormal; Notable for the following:    WBC 2.4 (*)    RBC 3.81 (*)    Hemoglobin 11.2 (*)    HCT 32.9 (*)    RDW 16.9 (*)    Platelets 109 (*)    All other components within normal limits  I-STAT TROPOININ, ED    Imaging Review Dg Chest 2 View  10/31/2015  CLINICAL DATA:  Dialysis patient reports he is not been feeling well for over 1 month. Chest pain, pressure and abdominal bloating during dialysis. Initial encounter. EXAM: CHEST  2 VIEW COMPARISON:  Single view of the chest 10/26/2015, 09/07/2014 and CT chest 09/05/2014. FINDINGS: There is marked cardiomegaly. No pneumothorax or pleural effusion is identified. Pulmonary vascular congestion is seen but there is no edema. Calcified mediastinal and hilar lymphadenopathy is compatible with old granulomatous disease or sarcoid and appears unchanged. IMPRESSION: Cardiomegaly without acute disease. Electronically Signed   By: Drusilla Kanner M.D.   On: 10/31/2015 16:10   I have personally  reviewed and evaluated these images and lab results as part of my medical decision-making.   EKG Interpretation None      MDM   Final diagnoses:  Generalized abdominal pain  Chest pain, unspecified chest pain type    With my initial discussion with the patient accessed and reviewed his recent studies including CT scan admissions and consultations. Discussed with him that he would perform  a prescription medical screening exam attempt to control his symptoms and evaluated via CT scanner labs for any acute process. He became very upset and agitated that I wouldn't instantly or immediately admitted to the hospital so that he can undergo his dialysis, "after I got some pain medicine". He on 2 occasions refused medications and CT scan all to his left the department against my advice.    Rolland Porter, MD 11/01/15 [phone]

## 2015-11-02 DIAGNOSIS — N186 End stage renal disease: Secondary | ICD-10-CM | POA: Diagnosis not present

## 2015-11-02 DIAGNOSIS — N2581 Secondary hyperparathyroidism of renal origin: Secondary | ICD-10-CM | POA: Diagnosis not present

## 2015-11-02 DIAGNOSIS — D631 Anemia in chronic kidney disease: Secondary | ICD-10-CM | POA: Diagnosis not present

## 2015-11-05 ENCOUNTER — Other Ambulatory Visit: Payer: Self-pay | Admitting: Emergency Medicine

## 2015-11-05 DIAGNOSIS — N2581 Secondary hyperparathyroidism of renal origin: Secondary | ICD-10-CM | POA: Diagnosis not present

## 2015-11-05 DIAGNOSIS — Z1211 Encounter for screening for malignant neoplasm of colon: Secondary | ICD-10-CM

## 2015-11-05 DIAGNOSIS — N186 End stage renal disease: Secondary | ICD-10-CM | POA: Diagnosis not present

## 2015-11-05 DIAGNOSIS — R140 Abdominal distension (gaseous): Secondary | ICD-10-CM

## 2015-11-05 DIAGNOSIS — D631 Anemia in chronic kidney disease: Secondary | ICD-10-CM | POA: Diagnosis not present

## 2015-11-05 DIAGNOSIS — R1084 Generalized abdominal pain: Secondary | ICD-10-CM

## 2015-11-05 NOTE — Telephone Encounter (Signed)
Spoke to patients significant other Leisa. Informed her of time and date of procedure. Directions were reiterated from appointment. She verbalized understanding and will relay information to patient.

## 2015-11-06 DIAGNOSIS — N186 End stage renal disease: Secondary | ICD-10-CM | POA: Diagnosis not present

## 2015-11-06 DIAGNOSIS — Z992 Dependence on renal dialysis: Secondary | ICD-10-CM | POA: Diagnosis not present

## 2015-11-06 DIAGNOSIS — I10 Essential (primary) hypertension: Secondary | ICD-10-CM | POA: Diagnosis not present

## 2015-11-07 ENCOUNTER — Telehealth: Payer: Self-pay | Admitting: *Deleted

## 2015-11-07 DIAGNOSIS — D631 Anemia in chronic kidney disease: Secondary | ICD-10-CM | POA: Diagnosis not present

## 2015-11-07 DIAGNOSIS — N2581 Secondary hyperparathyroidism of renal origin: Secondary | ICD-10-CM | POA: Diagnosis not present

## 2015-11-07 DIAGNOSIS — N186 End stage renal disease: Secondary | ICD-10-CM | POA: Diagnosis not present

## 2015-11-07 DIAGNOSIS — D509 Iron deficiency anemia, unspecified: Secondary | ICD-10-CM | POA: Diagnosis not present

## 2015-11-07 DIAGNOSIS — K625 Hemorrhage of anus and rectum: Secondary | ICD-10-CM

## 2015-11-07 HISTORY — DX: Hemorrhage of anus and rectum: K62.5

## 2015-11-07 NOTE — Telephone Encounter (Signed)
Patient aware of change of colon/(endo added)  From 5/16 at [facility] to 12/04/2015 at [facility] with propofol at 12 pm  Patient aware of date and time changes  Spoke with wife

## 2015-11-12 DIAGNOSIS — N186 End stage renal disease: Secondary | ICD-10-CM | POA: Diagnosis not present

## 2015-11-12 DIAGNOSIS — N2581 Secondary hyperparathyroidism of renal origin: Secondary | ICD-10-CM | POA: Diagnosis not present

## 2015-11-12 DIAGNOSIS — D631 Anemia in chronic kidney disease: Secondary | ICD-10-CM | POA: Diagnosis not present

## 2015-11-12 DIAGNOSIS — D509 Iron deficiency anemia, unspecified: Secondary | ICD-10-CM | POA: Diagnosis not present

## 2015-11-13 ENCOUNTER — Other Ambulatory Visit: Payer: Self-pay

## 2015-11-13 ENCOUNTER — Inpatient Hospital Stay (HOSPITAL_COMMUNITY)
Admission: EM | Admit: 2015-11-13 | Discharge: 2015-11-17 | DRG: 286 | Disposition: A | Discharge status: Home / Self Care | Payer: Medicare Other | Attending: Internal Medicine | Admitting: Internal Medicine

## 2015-11-13 ENCOUNTER — Encounter (HOSPITAL_COMMUNITY): Payer: Self-pay | Admitting: Emergency Medicine

## 2015-11-13 ENCOUNTER — Emergency Department (HOSPITAL_COMMUNITY): Payer: Medicare Other

## 2015-11-13 ENCOUNTER — Ambulatory Visit (HOSPITAL_BASED_OUTPATIENT_CLINIC_OR_DEPARTMENT_OTHER): Payer: Medicare Other

## 2015-11-13 DIAGNOSIS — R011 Cardiac murmur, unspecified: Secondary | ICD-10-CM | POA: Insufficient documentation

## 2015-11-13 DIAGNOSIS — Z86711 Personal history of pulmonary embolism: Secondary | ICD-10-CM

## 2015-11-13 DIAGNOSIS — R140 Abdominal distension (gaseous): Secondary | ICD-10-CM | POA: Diagnosis not present

## 2015-11-13 DIAGNOSIS — R162 Hepatomegaly with splenomegaly, not elsewhere classified: Secondary | ICD-10-CM | POA: Diagnosis present

## 2015-11-13 DIAGNOSIS — N186 End stage renal disease: Secondary | ICD-10-CM | POA: Diagnosis present

## 2015-11-13 DIAGNOSIS — D61818 Other pancytopenia: Secondary | ICD-10-CM | POA: Diagnosis present

## 2015-11-13 DIAGNOSIS — R7989 Other specified abnormal findings of blood chemistry: Secondary | ICD-10-CM

## 2015-11-13 DIAGNOSIS — I5023 Acute on chronic systolic (congestive) heart failure: Secondary | ICD-10-CM | POA: Diagnosis present

## 2015-11-13 DIAGNOSIS — I110 Hypertensive heart disease with heart failure: Secondary | ICD-10-CM | POA: Insufficient documentation

## 2015-11-13 DIAGNOSIS — I739 Peripheral vascular disease, unspecified: Secondary | ICD-10-CM | POA: Diagnosis present

## 2015-11-13 DIAGNOSIS — I252 Old myocardial infarction: Secondary | ICD-10-CM

## 2015-11-13 DIAGNOSIS — I272 Other secondary pulmonary hypertension: Secondary | ICD-10-CM | POA: Diagnosis present

## 2015-11-13 DIAGNOSIS — Z8249 Family history of ischemic heart disease and other diseases of the circulatory system: Secondary | ICD-10-CM

## 2015-11-13 DIAGNOSIS — Z91018 Allergy to other foods: Secondary | ICD-10-CM

## 2015-11-13 DIAGNOSIS — E559 Vitamin D deficiency, unspecified: Secondary | ICD-10-CM | POA: Diagnosis present

## 2015-11-13 DIAGNOSIS — M79661 Pain in right lower leg: Secondary | ICD-10-CM

## 2015-11-13 DIAGNOSIS — Z87891 Personal history of nicotine dependence: Secondary | ICD-10-CM

## 2015-11-13 DIAGNOSIS — Z515 Encounter for palliative care: Secondary | ICD-10-CM

## 2015-11-13 DIAGNOSIS — M79662 Pain in left lower leg: Secondary | ICD-10-CM

## 2015-11-13 DIAGNOSIS — E1142 Type 2 diabetes mellitus with diabetic polyneuropathy: Secondary | ICD-10-CM | POA: Diagnosis present

## 2015-11-13 DIAGNOSIS — I071 Rheumatic tricuspid insufficiency: Secondary | ICD-10-CM

## 2015-11-13 DIAGNOSIS — I50813 Acute on chronic right heart failure: Secondary | ICD-10-CM | POA: Diagnosis present

## 2015-11-13 DIAGNOSIS — G4733 Obstructive sleep apnea (adult) (pediatric): Secondary | ICD-10-CM | POA: Diagnosis present

## 2015-11-13 DIAGNOSIS — I2510 Atherosclerotic heart disease of native coronary artery without angina pectoris: Secondary | ICD-10-CM | POA: Diagnosis not present

## 2015-11-13 DIAGNOSIS — I429 Cardiomyopathy, unspecified: Secondary | ICD-10-CM | POA: Diagnosis not present

## 2015-11-13 DIAGNOSIS — I313 Pericardial effusion (noninflammatory): Secondary | ICD-10-CM | POA: Insufficient documentation

## 2015-11-13 DIAGNOSIS — Z79899 Other long term (current) drug therapy: Secondary | ICD-10-CM

## 2015-11-13 DIAGNOSIS — I482 Chronic atrial fibrillation, unspecified: Secondary | ICD-10-CM | POA: Diagnosis present

## 2015-11-13 DIAGNOSIS — K7460 Unspecified cirrhosis of liver: Secondary | ICD-10-CM | POA: Diagnosis present

## 2015-11-13 DIAGNOSIS — Z86718 Personal history of other venous thrombosis and embolism: Secondary | ICD-10-CM

## 2015-11-13 DIAGNOSIS — I77810 Thoracic aortic ectasia: Secondary | ICD-10-CM | POA: Insufficient documentation

## 2015-11-13 DIAGNOSIS — R55 Syncope and collapse: Secondary | ICD-10-CM | POA: Diagnosis present

## 2015-11-13 DIAGNOSIS — I10 Essential (primary) hypertension: Secondary | ICD-10-CM | POA: Diagnosis present

## 2015-11-13 DIAGNOSIS — G8929 Other chronic pain: Secondary | ICD-10-CM | POA: Diagnosis present

## 2015-11-13 DIAGNOSIS — R079 Chest pain, unspecified: Secondary | ICD-10-CM | POA: Diagnosis present

## 2015-11-13 DIAGNOSIS — R1013 Epigastric pain: Secondary | ICD-10-CM

## 2015-11-13 DIAGNOSIS — I351 Nonrheumatic aortic (valve) insufficiency: Secondary | ICD-10-CM

## 2015-11-13 DIAGNOSIS — Z992 Dependence on renal dialysis: Secondary | ICD-10-CM

## 2015-11-13 DIAGNOSIS — R0602 Shortness of breath: Secondary | ICD-10-CM | POA: Diagnosis not present

## 2015-11-13 DIAGNOSIS — N2581 Secondary hyperparathyroidism of renal origin: Secondary | ICD-10-CM | POA: Diagnosis not present

## 2015-11-13 DIAGNOSIS — R0789 Other chest pain: Secondary | ICD-10-CM | POA: Diagnosis not present

## 2015-11-13 DIAGNOSIS — I120 Hypertensive chronic kidney disease with stage 5 chronic kidney disease or end stage renal disease: Secondary | ICD-10-CM | POA: Diagnosis not present

## 2015-11-13 DIAGNOSIS — R112 Nausea with vomiting, unspecified: Secondary | ICD-10-CM

## 2015-11-13 DIAGNOSIS — I509 Heart failure, unspecified: Secondary | ICD-10-CM

## 2015-11-13 DIAGNOSIS — E1122 Type 2 diabetes mellitus with diabetic chronic kidney disease: Secondary | ICD-10-CM | POA: Diagnosis present

## 2015-11-13 DIAGNOSIS — R778 Other specified abnormalities of plasma proteins: Secondary | ICD-10-CM | POA: Diagnosis present

## 2015-11-13 DIAGNOSIS — I2699 Other pulmonary embolism without acute cor pulmonale: Secondary | ICD-10-CM | POA: Diagnosis present

## 2015-11-13 DIAGNOSIS — M109 Gout, unspecified: Secondary | ICD-10-CM | POA: Diagnosis present

## 2015-11-13 DIAGNOSIS — R05 Cough: Secondary | ICD-10-CM | POA: Diagnosis not present

## 2015-11-13 DIAGNOSIS — R188 Other ascites: Secondary | ICD-10-CM | POA: Diagnosis present

## 2015-11-13 DIAGNOSIS — K219 Gastro-esophageal reflux disease without esophagitis: Secondary | ICD-10-CM | POA: Diagnosis present

## 2015-11-13 DIAGNOSIS — I428 Other cardiomyopathies: Secondary | ICD-10-CM | POA: Insufficient documentation

## 2015-11-13 DIAGNOSIS — I4892 Unspecified atrial flutter: Secondary | ICD-10-CM | POA: Diagnosis present

## 2015-11-13 DIAGNOSIS — Z9115 Patient's noncompliance with renal dialysis: Secondary | ICD-10-CM

## 2015-11-13 DIAGNOSIS — I82409 Acute embolism and thrombosis of unspecified deep veins of unspecified lower extremity: Secondary | ICD-10-CM | POA: Diagnosis present

## 2015-11-13 DIAGNOSIS — I5042 Chronic combined systolic (congestive) and diastolic (congestive) heart failure: Secondary | ICD-10-CM | POA: Diagnosis present

## 2015-11-13 HISTORY — DX: Other specified abnormal findings of blood chemistry: R79.89

## 2015-11-13 HISTORY — DX: Acute on chronic systolic (congestive) heart failure: I50.23

## 2015-11-13 LAB — CBC
HCT: 32.4 % — ABNORMAL LOW (ref 39.0–52.0)
Hemoglobin: 11.3 g/dL — ABNORMAL LOW (ref 13.0–17.0)
MCH: 29.4 pg (ref 26.0–34.0)
MCHC: 34.9 g/dL (ref 30.0–36.0)
MCV: 84.4 fL (ref 78.0–100.0)
Platelets: 95 10*3/uL — ABNORMAL LOW (ref 150–400)
RBC: 3.84 MIL/uL — ABNORMAL LOW (ref 4.22–5.81)
RDW: 16.7 % — AB (ref 11.5–15.5)
WBC: 2.7 10*3/uL — ABNORMAL LOW (ref 4.0–10.5)

## 2015-11-13 LAB — COMPREHENSIVE METABOLIC PANEL
ALBUMIN: 3.3 g/dL — AB (ref 3.5–5.0)
ALK PHOS: 155 U/L — AB (ref 38–126)
ALT: 14 U/L — ABNORMAL LOW (ref 17–63)
AST: 31 U/L (ref 15–41)
Anion gap: 16 — ABNORMAL HIGH (ref 5–15)
BILIRUBIN TOTAL: 1.6 mg/dL — AB (ref 0.3–1.2)
BUN: 45 mg/dL — AB (ref 6–20)
CALCIUM: 9.6 mg/dL (ref 8.9–10.3)
CO2: 25 mmol/L (ref 22–32)
Chloride: 96 mmol/L — ABNORMAL LOW (ref 101–111)
Creatinine, Ser: 9.6 mg/dL — ABNORMAL HIGH (ref 0.61–1.24)
GFR calc Af Amer: 6 mL/min — ABNORMAL LOW (ref >60)
GFR, EST NON AFRICAN AMERICAN: 5 mL/min — AB (ref >60)
GLUCOSE: 90 mg/dL (ref 65–99)
POTASSIUM: 5 mmol/L (ref 3.5–5.1)
Sodium: 137 mmol/L (ref 135–145)
TOTAL PROTEIN: 7.7 g/dL (ref 6.5–8.1)

## 2015-11-13 LAB — DIFFERENTIAL
BASOS PCT: 1 %
Basophils Absolute: 0 10*3/uL (ref 0.0–0.1)
EOS PCT: 2 %
Eosinophils Absolute: 0 10*3/uL (ref 0.0–0.7)
LYMPHS PCT: 20 %
Lymphs Abs: 0.5 10*3/uL — ABNORMAL LOW (ref 0.7–4.0)
MONO ABS: 0.7 10*3/uL (ref 0.1–1.0)
MONOS PCT: 25 %
NEUTROS ABS: 1.4 10*3/uL — AB (ref 1.7–7.7)
Neutrophils Relative %: 53 %

## 2015-11-13 LAB — I-STAT TROPONIN, ED: TROPONIN I, POC: 0.06 ng/mL (ref 0.00–0.08)

## 2015-11-13 LAB — TROPONIN I
TROPONIN I: 0.06 ng/mL — AB (ref <0.031)
Troponin I: 0.06 ng/mL — ABNORMAL HIGH (ref <0.031)

## 2015-11-13 LAB — I-STAT CG4 LACTIC ACID, ED
LACTIC ACID, VENOUS: 1.53 mmol/L (ref 0.5–2.0)
Lactic Acid, Venous: 1.49 mmol/L (ref 0.5–2.0)

## 2015-11-13 MED ORDER — MORPHINE SULFATE (PF) 4 MG/ML IV SOLN
4.0000 mg | Freq: Once | INTRAVENOUS | Status: AC
  Administered 2015-11-13: 4 mg via INTRAVENOUS
  Filled 2015-11-13: qty 1

## 2015-11-13 MED ORDER — HYDROMORPHONE HCL 1 MG/ML IJ SOLN
1.0000 mg | Freq: Once | INTRAMUSCULAR | Status: AC
  Administered 2015-11-13: 1 mg via INTRAVENOUS
  Filled 2015-11-13: qty 1

## 2015-11-13 MED ORDER — PANTOPRAZOLE SODIUM 40 MG IV SOLR
40.0000 mg | Freq: Once | INTRAVENOUS | Status: AC
  Administered 2015-11-13: 40 mg via INTRAVENOUS
  Filled 2015-11-13: qty 40

## 2015-11-13 MED ORDER — GI COCKTAIL ~~LOC~~
30.0000 mL | Freq: Once | ORAL | Status: AC
  Administered 2015-11-13: 30 mL via ORAL
  Filled 2015-11-13: qty 30

## 2015-11-13 NOTE — ED Notes (Signed)
Pt c/o worsening epigastric pain; requesting applesauce and graham crackers which were provided

## 2015-11-13 NOTE — ED Provider Notes (Signed)
CSN: [id number]     Arrival date & time 11/13/15  1602 History   First MD Initiated Contact with Patient 11/13/15 1614     Chief Complaint  Patient presents with  . Chest Pain  . Cough     (Consider location/radiation/quality/duration/timing/severity/associated sxs/prior Treatment) Patient is a 65 y.o. male presenting with chest pain and cough. The history is provided by the patient.  Chest Pain Associated symptoms: cough   Cough Associated symptoms: chest pain   He Has been having episodes of chest discomfort for the last several months. He describes a tight feeling which starts in the epigastric area and goes to the mid chest without radiation to the back, neck, jaw, arm. Pain is severe and he rates it at 9/10. It is associated with dyspnea, nausea, vomiting, diaphoresis, cough. At times, he will go into coughing paroxysms and pass out. He did have an episode of passing out today. Pain massive variable amount of time. There is no consistent precipitating factor. He does mention will come on after he eats but also will come on if he doesn't eat. It can come on at rest or with normal activity. He did have an echocardiogram earlier today but does not know the results. He is a dialysis patient and had dialysis yesterday. He has been under the care of a cardiologist. He did take aspirin prior to coming to the ED.  Past Medical History  Diagnosis Date  . Anemia   . AVF (arteriovenous fistula) (HCC)     Left  . Secondary hyperparathyroidism (HCC)   . Hypovitaminosis D   . Hypertensive urgency     H/o  . CHF (congestive heart failure) (HCC)     EF 20-25%  . Exertional shortness of breath     "related to infection in my lungs right now" (05/03/2013)  . History of gout     "before I started doing the dialysis" (05/03/2013)  . Sleep apnea   . GERD (gastroesophageal reflux disease)   . Syncope     felt secondary to residual anesthesia the day before - 2D echo unremarkable  . Pulmonary  embolism (HCC)     with right DVT secondary to recent surgery  . Atrial fibrillation (HCC)     not on coumadin due to large spontaneous hematoma on back and anemia  . Myocardial infarction (HCC) 90's  . Coronary artery disease   . Dysrhythmia     afib  . Peripheral vascular disease (HCC)     dvt leg 12/14  . Hypertension   . ESRD (end stage renal disease) on dialysis (HCC)     [facility] mon/wed/fri  . Atrial flutter (HCC) 04/27/2013  . S/P repair of ventral hernia 03/21/2014  . Hereditary and idiopathic peripheral neuropathy 08/29/2015  . Diverticulosis   . Hepatosplenomegaly 10/18/2015    Workup per Dr Kaplan/ GI in Dec 2015 > abd pain improved after paracentesis x 2 (1.2L, 1.3L). Had negative hepA/ hep B/ hep C testing. For hepatosplenomagealy pt underwent transjugular liver biopsy by IR with hepatic vein wedge pressure measurement which showed elevated right heart and free hepatic venous pressures likely due to right heart failure.  There was no evidence of portal hypertension. Liver bx originally was reported as "End stage liver disease/ cirrhosis", then was corrected > "No evidence of cirrhosis".  The corrected liver biopsy result reads "SINUSOIDAL DILATATION WITH SCATTERED FOCI OF HEPATITIS".  Last GI visit was Aug 2016 for bloating/ abd pain, noted he had a normal  gastric empty study.  -Right lobe liver lesion. Felt to be hemangioma, Biopsy pending.  -thrombocytopenia. Dates back to 08/2013.  -ESRD. MWF HD.  -biventricular heart failure, acute right sided heart failure. Daily HD to address volume overload.     Past Surgical History  Procedure Laterality Date  . Av fistula placement Left     Dr. Charlean Sanfilippo; "I've had 2 on the left' (05/03/2013)  . Av fistula placement Right ~ 2011  . Knee arthroscopy Left   . Avgg removal Right 05/04/2013    Procedure: REMOVAL OF ARTERIOVENOUS Fistula Right Arm;  Surgeon: [name], MD;  Location: [facility] OR;  Service: Vascular;  Laterality: Right;  .  Insertion of dialysis catheter Right 05/04/2013    Procedure: INSERTION OF DIALYSIS CATHETER;  Surgeon: [name], MD;  Location: [facility] OR;  Service: Vascular;  Laterality: Right;  . Colonoscopy  10-29-2005    Hx: of  . Bascilic vein transposition Left 06/27/2013    Procedure: BASCILIC VEIN TRANSPOSITION;  Surgeon: [name], MD;  Location: [facility] OR;  Service: Vascular;  Laterality: Left;  . Cardioversion N/A 08/29/2013    Procedure: CARDIOVERSION;  Surgeon: [name], MD;  Location: [facility] ENDOSCOPY;  Service: Cardiovascular;  Laterality: N/A;  . Removal of a dialysis catheter  2/15  . Hernia repair  03/21/14    Umbilical hernia-Dr. Derrell Lolling  . Umbilical hernia repair N/A 03/21/2014    Procedure: LAPAROSCOPIC UMBILICAL HERNIA REPAIR WITH MESH;  Surgeon: Axel Filler, MD;  Location: [facility] OR;  Service: General;  Laterality: N/A;  . Insertion of mesh N/A 03/21/2014    Procedure: INSERTION OF MESH;  Surgeon: Axel Filler, MD;  Location: [facility] OR;  Service: General;  Laterality: N/A;  . Venogram Left 05/26/2013    Procedure: VENOGRAM;  Surgeon: Fransisco Hertz, MD;  Location: [facility] CATH LAB;  Service: Cardiovascular;  Laterality: Left;   Family History  Problem Relation Age of Onset  . Hypertension Father   . Kidney disease Father   . Allergies Father   . Deep vein thrombosis Sister   . Pulmonary embolism Sister   . Diabetes Paternal Grandmother    Social History  Substance Use Topics  . Smoking status: Former Smoker -- 0.25 packs/day for .5 years    Types: Cigarettes    Quit date: 09/08/1972  . Smokeless tobacco: Never Used  . Alcohol Use: No     Comment: 05/03/2013 "haven't had a glass of wine in ~ 3 months or so; sometimes will have one w/dinner"    Review of Systems  Respiratory: Positive for cough.   Cardiovascular: Positive for chest pain.  All other systems reviewed and are negative.     Allergies  Pork-derived products  Home Medications   Prior to Admission  medications   Medication Sig Start Date End Date Taking? Authorizing Provider  B Complex-C (B-COMPLEX WITH VITAMIN C) tablet Take 1 tablet by mouth daily.    Historical Provider, MD  labetalol (NORMODYNE) 200 MG tablet Take 200 mg by mouth daily.     Historical Provider, MD  Nutritional Supplements (FEEDING SUPPLEMENT, NEPRO CARB STEADY,) LIQD Take 237 mLs by mouth 2 (two) times daily between meals. 10/19/15   Nishant Dhungel, MD  oxyCODONE-acetaminophen (ROXICET) 5-325 MG tablet Take 2 tablets by mouth every 6 (six) hours as needed for severe pain. 10/19/15   Nishant Dhungel, MD  pantoprazole (PROTONIX) 40 MG tablet Take 1 tablet (40 mg total) by mouth daily. 10/19/15   Eddie North, MD  sevelamer carbonate (RENVELA) 800 MG tablet Take 4 tablets (3,200 mg total) by mouth 3 (three) times daily with meals. Patient taking differently: Take 2,400 mg by mouth 3 (three) times daily with meals.  09/12/14   Marianne L York, PA-C   BP 127/93 mmHg  Pulse 91  Temp(Src) 99.1 F (37.3 C) (Oral)  Resp 17  SpO2 98% Physical Exam  Nursing note and vitals reviewed.  65 year old male, resting comfortably and in no acute distress. Vital signs are significant for borderline hypertension. Oxygen saturation is 94%, which is normal. Head is normocephalic and atraumatic. PERRLA, EOMI. Oropharynx is clear. Neck is nontender and supple without adenopathy. JVD is present at 90. Back is nontender and there is no CVA tenderness. Lungs are clear without rales, wheezes, or rhonchi. Chest is nontender. Heart has regular rate and rhythm without murmur. Abdomen is soft, flat, with moderate epigastric tenderness. There is no rebound or guarding. There are no masses or hepatosplenomegaly and peristalsis is normoactive. Extremities have no cyanosis or edema, full range of motion is present. AV fistula is present in the right forearm with thrill present. Skin is warm and dry without rash. Neurologic: Mental status is normal,  cranial nerves are intact, there are no motor or sensory deficits.  ED Course  Procedures (including critical care time) Labs Review Labs Reviewed  CBC  DIFFERENTIAL  TROPONIN I  COMPREHENSIVE METABOLIC PANEL  I-STAT CG4 LACTIC ACID, ED    Imaging Review No results found. I have personally reviewed and evaluated these images and lab results as part of my medical decision-making.   EKG Interpretation   Date/Time:  Tuesday November 13 2015 16:20:48 EST Ventricular Rate:  77 PR Interval:    QRS Duration: 106 QT Interval:  450 QTC Calculation: 509 R Axis:   139 Text Interpretation:  Atrial fibrillation Right axis deviation Nonspecific  ST and T wave abnormality , probably digitalis effect Prolonged QT  Abnormal ECG When compared with ECG of 10/30/2014, No significant change  was found Confirmed by [facility]  MD, Ernesto Lashway ([id number]) on 11/13/2015 4:27:43 PM      MDM   Final diagnoses:  Chest pain, unspecified chest pain type  Epigastric pain  Syncope and collapse  Non-intractable vomiting with nausea, unspecified vomiting type  Pancytopenia (HCC)  End-stage renal disease on hemodialysis (HCC)    Abdominal/chest discomfort of uncertain cause. Syncope which seems to be related to coughing paroxysms. I reviewed his old records and he was seen in the ED several weeks ago with similar complaints and has been seen in his cardiologist's office and sent. He does have history of atrial fibrillation without anticoagulation. Report of echocardiogram from today shows ejection fraction 30-35% but no significant other findings.He has also been followed by gastroenterology for chronic abdominal pain. I suspect that his symptoms are actually a predominantly GI in origin although it is noted that he is on a proton pump inhibitor. In addition to cardiac enzymes, he will be given trial of a GI cocktail and additional pantoprazole.  Patient failed to respond to the above noted treatment. He is given  hydromorphone without any relief. Troponin is come back and only elevated and the repeat troponin is unchanged. Patient now is concerned about fluid in his abdomen. He reports he was told he had fluid around his heart. The echocardiogram report states trivial per cardial effusion. He states that in the past he had a large amount of fluid drawn from his abdomen. He will be sent for  ultrasound to assess this. Third troponin will be obtained. Case is signed out to Dr. Mora Bellman.    Dione Booze, MD 11/14/15 [phone]

## 2015-11-13 NOTE — ED Notes (Signed)
MD at bedside. 

## 2015-11-13 NOTE — ED Notes (Signed)
Pt states he has been having abd pain and bloating for the last month that has been causing him to be short of breath.. Today had had a sudden onset of chest tightness and and a non productive cough. Pt states while sitting on his bed he had a coughing episode and then remembers waking up laying on the cough and thinks he may have had a syncope event. Pt denies hitting head. Pt's breathing is unlabored at this time and states his chest tightness is 9/10 at this time. Pt reports taking 4 81mg  ASA prior to ems arrival.

## 2015-11-13 NOTE — ED Notes (Signed)
Pt off unit with xray 

## 2015-11-14 ENCOUNTER — Encounter (HOSPITAL_COMMUNITY): Payer: Self-pay | Admitting: Cardiology

## 2015-11-14 ENCOUNTER — Emergency Department (HOSPITAL_COMMUNITY): Payer: Medicare Other

## 2015-11-14 ENCOUNTER — Inpatient Hospital Stay (HOSPITAL_COMMUNITY): Payer: Medicare Other

## 2015-11-14 ENCOUNTER — Other Ambulatory Visit: Payer: Self-pay

## 2015-11-14 DIAGNOSIS — I2510 Atherosclerotic heart disease of native coronary artery without angina pectoris: Secondary | ICD-10-CM | POA: Diagnosis present

## 2015-11-14 DIAGNOSIS — R778 Other specified abnormalities of plasma proteins: Secondary | ICD-10-CM

## 2015-11-14 DIAGNOSIS — G8929 Other chronic pain: Secondary | ICD-10-CM | POA: Diagnosis present

## 2015-11-14 DIAGNOSIS — I482 Chronic atrial fibrillation: Secondary | ICD-10-CM | POA: Diagnosis not present

## 2015-11-14 DIAGNOSIS — R55 Syncope and collapse: Secondary | ICD-10-CM | POA: Diagnosis not present

## 2015-11-14 DIAGNOSIS — Z9115 Patient's noncompliance with renal dialysis: Secondary | ICD-10-CM | POA: Diagnosis not present

## 2015-11-14 DIAGNOSIS — E1122 Type 2 diabetes mellitus with diabetic chronic kidney disease: Secondary | ICD-10-CM | POA: Diagnosis present

## 2015-11-14 DIAGNOSIS — R140 Abdominal distension (gaseous): Secondary | ICD-10-CM

## 2015-11-14 DIAGNOSIS — Z87891 Personal history of nicotine dependence: Secondary | ICD-10-CM | POA: Diagnosis not present

## 2015-11-14 DIAGNOSIS — K219 Gastro-esophageal reflux disease without esophagitis: Secondary | ICD-10-CM | POA: Diagnosis present

## 2015-11-14 DIAGNOSIS — R079 Chest pain, unspecified: Secondary | ICD-10-CM | POA: Diagnosis not present

## 2015-11-14 DIAGNOSIS — E1142 Type 2 diabetes mellitus with diabetic polyneuropathy: Secondary | ICD-10-CM | POA: Diagnosis present

## 2015-11-14 DIAGNOSIS — R1013 Epigastric pain: Secondary | ICD-10-CM | POA: Diagnosis not present

## 2015-11-14 DIAGNOSIS — I5023 Acute on chronic systolic (congestive) heart failure: Secondary | ICD-10-CM | POA: Diagnosis not present

## 2015-11-14 DIAGNOSIS — Z91018 Allergy to other foods: Secondary | ICD-10-CM | POA: Diagnosis not present

## 2015-11-14 DIAGNOSIS — N186 End stage renal disease: Secondary | ICD-10-CM

## 2015-11-14 DIAGNOSIS — I5042 Chronic combined systolic (congestive) and diastolic (congestive) heart failure: Secondary | ICD-10-CM | POA: Diagnosis not present

## 2015-11-14 DIAGNOSIS — I120 Hypertensive chronic kidney disease with stage 5 chronic kidney disease or end stage renal disease: Secondary | ICD-10-CM | POA: Diagnosis present

## 2015-11-14 DIAGNOSIS — I739 Peripheral vascular disease, unspecified: Secondary | ICD-10-CM | POA: Diagnosis present

## 2015-11-14 DIAGNOSIS — R162 Hepatomegaly with splenomegaly, not elsewhere classified: Secondary | ICD-10-CM | POA: Diagnosis present

## 2015-11-14 DIAGNOSIS — G4733 Obstructive sleep apnea (adult) (pediatric): Secondary | ICD-10-CM | POA: Diagnosis present

## 2015-11-14 DIAGNOSIS — Z8249 Family history of ischemic heart disease and other diseases of the circulatory system: Secondary | ICD-10-CM | POA: Diagnosis not present

## 2015-11-14 DIAGNOSIS — I252 Old myocardial infarction: Secondary | ICD-10-CM | POA: Diagnosis not present

## 2015-11-14 DIAGNOSIS — N2581 Secondary hyperparathyroidism of renal origin: Secondary | ICD-10-CM | POA: Diagnosis present

## 2015-11-14 DIAGNOSIS — I50813 Acute on chronic right heart failure: Secondary | ICD-10-CM

## 2015-11-14 DIAGNOSIS — R7989 Other specified abnormal findings of blood chemistry: Secondary | ICD-10-CM

## 2015-11-14 DIAGNOSIS — I429 Cardiomyopathy, unspecified: Secondary | ICD-10-CM | POA: Diagnosis not present

## 2015-11-14 DIAGNOSIS — I10 Essential (primary) hypertension: Secondary | ICD-10-CM | POA: Diagnosis not present

## 2015-11-14 DIAGNOSIS — Z79899 Other long term (current) drug therapy: Secondary | ICD-10-CM | POA: Diagnosis not present

## 2015-11-14 DIAGNOSIS — Z86711 Personal history of pulmonary embolism: Secondary | ICD-10-CM | POA: Diagnosis not present

## 2015-11-14 DIAGNOSIS — I272 Other secondary pulmonary hypertension: Secondary | ICD-10-CM | POA: Diagnosis present

## 2015-11-14 DIAGNOSIS — Z992 Dependence on renal dialysis: Secondary | ICD-10-CM | POA: Diagnosis not present

## 2015-11-14 DIAGNOSIS — R188 Other ascites: Secondary | ICD-10-CM | POA: Diagnosis present

## 2015-11-14 DIAGNOSIS — D61818 Other pancytopenia: Secondary | ICD-10-CM | POA: Diagnosis present

## 2015-11-14 DIAGNOSIS — I132 Hypertensive heart and chronic kidney disease with heart failure and with stage 5 chronic kidney disease, or end stage renal disease: Secondary | ICD-10-CM | POA: Diagnosis not present

## 2015-11-14 DIAGNOSIS — M109 Gout, unspecified: Secondary | ICD-10-CM | POA: Diagnosis present

## 2015-11-14 DIAGNOSIS — M79661 Pain in right lower leg: Secondary | ICD-10-CM | POA: Diagnosis not present

## 2015-11-14 DIAGNOSIS — I4892 Unspecified atrial flutter: Secondary | ICD-10-CM | POA: Diagnosis present

## 2015-11-14 DIAGNOSIS — M79662 Pain in left lower leg: Secondary | ICD-10-CM | POA: Diagnosis not present

## 2015-11-14 DIAGNOSIS — E559 Vitamin D deficiency, unspecified: Secondary | ICD-10-CM | POA: Diagnosis present

## 2015-11-14 DIAGNOSIS — I509 Heart failure, unspecified: Secondary | ICD-10-CM | POA: Diagnosis not present

## 2015-11-14 DIAGNOSIS — K7460 Unspecified cirrhosis of liver: Secondary | ICD-10-CM | POA: Diagnosis present

## 2015-11-14 DIAGNOSIS — Z86718 Personal history of other venous thrombosis and embolism: Secondary | ICD-10-CM | POA: Diagnosis not present

## 2015-11-14 HISTORY — DX: Other specified abnormalities of plasma proteins: R77.8

## 2015-11-14 HISTORY — DX: Other specified abnormal findings of blood chemistry: R79.89

## 2015-11-14 HISTORY — DX: Acute on chronic right heart failure: I50.813

## 2015-11-14 LAB — CBG MONITORING, ED: Glucose-Capillary: 66 mg/dL (ref 65–99)

## 2015-11-14 LAB — LIPASE, BLOOD: LIPASE: 38 U/L (ref 11–51)

## 2015-11-14 LAB — LIPID PANEL
CHOL/HDL RATIO: 2.3 ratio
Cholesterol: 72 mg/dL (ref 0–200)
HDL: 31 mg/dL — ABNORMAL LOW (ref >40)
LDL CALC: 32 mg/dL (ref 0–99)
Triglycerides: 47 mg/dL (ref <150)
VLDL: 9 mg/dL (ref 0–40)

## 2015-11-14 LAB — COMPREHENSIVE METABOLIC PANEL
ALT: 13 U/L — AB (ref 17–63)
ANION GAP: 19 — AB (ref 5–15)
AST: 29 U/L (ref 15–41)
Albumin: 3.2 g/dL — ABNORMAL LOW (ref 3.5–5.0)
Alkaline Phosphatase: 144 U/L — ABNORMAL HIGH (ref 38–126)
BUN: 51 mg/dL — ABNORMAL HIGH (ref 6–20)
CHLORIDE: 99 mmol/L — AB (ref 101–111)
CO2: 19 mmol/L — AB (ref 22–32)
Calcium: 9.3 mg/dL (ref 8.9–10.3)
Creatinine, Ser: 10.71 mg/dL — ABNORMAL HIGH (ref 0.61–1.24)
GFR calc non Af Amer: 4 mL/min — ABNORMAL LOW (ref >60)
GFR, EST AFRICAN AMERICAN: 5 mL/min — AB (ref >60)
Glucose, Bld: 67 mg/dL (ref 65–99)
POTASSIUM: 5.3 mmol/L — AB (ref 3.5–5.1)
SODIUM: 137 mmol/L (ref 135–145)
Total Bilirubin: 1.4 mg/dL — ABNORMAL HIGH (ref 0.3–1.2)
Total Protein: 7.1 g/dL (ref 6.5–8.1)

## 2015-11-14 LAB — CBC
HEMATOCRIT: 32.8 % — AB (ref 39.0–52.0)
HEMOGLOBIN: 11 g/dL — AB (ref 13.0–17.0)
MCH: 28.1 pg (ref 26.0–34.0)
MCHC: 33.5 g/dL (ref 30.0–36.0)
MCV: 83.7 fL (ref 78.0–100.0)
Platelets: 95 10*3/uL — ABNORMAL LOW (ref 150–400)
RBC: 3.92 MIL/uL — AB (ref 4.22–5.81)
RDW: 16.5 % — ABNORMAL HIGH (ref 11.5–15.5)
WBC: 2.1 10*3/uL — ABNORMAL LOW (ref 4.0–10.5)

## 2015-11-14 LAB — GLUCOSE, CAPILLARY: GLUCOSE-CAPILLARY: 103 mg/dL — AB (ref 65–99)

## 2015-11-14 LAB — PROTIME-INR
INR: 1.47 (ref 0.00–1.49)
Prothrombin Time: 17.9 seconds — ABNORMAL HIGH (ref 11.6–15.2)

## 2015-11-14 LAB — TROPONIN I
TROPONIN I: 0.06 ng/mL — AB (ref <0.031)
TROPONIN I: 0.06 ng/mL — AB (ref <0.031)
TROPONIN I: 0.07 ng/mL — AB (ref <0.031)
Troponin I: 0.06 ng/mL — ABNORMAL HIGH (ref <0.031)

## 2015-11-14 LAB — MRSA PCR SCREENING: MRSA BY PCR: NEGATIVE

## 2015-11-14 LAB — APTT: APTT: 36 s (ref 24–37)

## 2015-11-14 LAB — MAGNESIUM: Magnesium: 2.6 mg/dL — ABNORMAL HIGH (ref 1.7–2.4)

## 2015-11-14 MED ORDER — LIDOCAINE-PRILOCAINE 2.5-2.5 % EX CREA
1.0000 "application " | TOPICAL_CREAM | CUTANEOUS | Status: DC | PRN
  Filled 2015-11-14: qty 5

## 2015-11-14 MED ORDER — LABETALOL HCL 200 MG PO TABS
200.0000 mg | ORAL_TABLET | Freq: Every day | ORAL | Status: DC
  Filled 2015-11-14 (×2): qty 1

## 2015-11-14 MED ORDER — ALTEPLASE 2 MG IJ SOLR
2.0000 mg | Freq: Once | INTRAMUSCULAR | Status: DC | PRN
  Filled 2015-11-14: qty 2

## 2015-11-14 MED ORDER — CETYLPYRIDINIUM CHLORIDE 0.05 % MT LIQD: 7.0000 mL | Freq: Two times a day (BID) | OROMUCOSAL | Status: DC

## 2015-11-14 MED ORDER — OXYCODONE-ACETAMINOPHEN 5-325 MG PO TABS
1.0000 | ORAL_TABLET | Freq: Four times a day (QID) | ORAL | Status: DC | PRN
  Administered 2015-11-15 – 2015-11-17 (×3): 1 via ORAL
  Filled 2015-11-14 (×3): qty 1

## 2015-11-14 MED ORDER — SODIUM CHLORIDE 0.9 % IV SOLN
INTRAVENOUS | Status: DC
  Administered 2015-11-14: 07:00:00 via INTRAVENOUS

## 2015-11-14 MED ORDER — DOXERCALCIFEROL 4 MCG/2ML IV SOLN
INTRAVENOUS | Status: AC
  Filled 2015-11-14: qty 4

## 2015-11-14 MED ORDER — SODIUM CHLORIDE 0.9 % IV SOLN: INTRAVENOUS | Status: DC

## 2015-11-14 MED ORDER — NITROGLYCERIN 0.4 MG SL SUBL: 0.4000 mg | SUBLINGUAL_TABLET | SUBLINGUAL | Status: DC | PRN

## 2015-11-14 MED ORDER — B COMPLEX-C PO TABS
1.0000 | ORAL_TABLET | Freq: Every day | ORAL | Status: DC
  Administered 2015-11-16: 1 via ORAL
  Filled 2015-11-14 (×8): qty 1

## 2015-11-14 MED ORDER — LIDOCAINE HCL (PF) 1 % IJ SOLN: 5.0000 mL | INTRAMUSCULAR | Status: DC | PRN

## 2015-11-14 MED ORDER — SODIUM CHLORIDE 0.9 % IV SOLN: 100.0000 mL | INTRAVENOUS | Status: DC | PRN

## 2015-11-14 MED ORDER — DOXERCALCIFEROL 4 MCG/2ML IV SOLN
6.0000 ug | INTRAVENOUS | Status: DC
  Administered 2015-11-14 – 2015-11-16 (×2): 6 ug via INTRAVENOUS
  Filled 2015-11-14 (×2): qty 4

## 2015-11-14 MED ORDER — SEVELAMER CARBONATE 800 MG PO TABS
2400.0000 mg | ORAL_TABLET | Freq: Three times a day (TID) | ORAL | Status: DC
  Administered 2015-11-14 – 2015-11-17 (×5): 2400 mg via ORAL
  Filled 2015-11-14 (×9): qty 3

## 2015-11-14 MED ORDER — SODIUM CHLORIDE 0.9% FLUSH
3.0000 mL | Freq: Two times a day (BID) | INTRAVENOUS | Status: DC
Start: 2015-11-14 — End: 2015-11-15
  Administered 2015-11-15: 3 mL via INTRAVENOUS

## 2015-11-14 MED ORDER — ONDANSETRON HCL 4 MG/2ML IJ SOLN
4.0000 mg | Freq: Once | INTRAMUSCULAR | Status: AC
  Administered 2015-11-14: 4 mg via INTRAVENOUS
  Filled 2015-11-14: qty 2

## 2015-11-14 MED ORDER — SODIUM CHLORIDE 0.9 % IV SOLN: 250.0000 mL | INTRAVENOUS | Status: DC | PRN

## 2015-11-14 MED ORDER — MORPHINE SULFATE (PF) 2 MG/ML IV SOLN
2.0000 mg | INTRAVENOUS | Status: DC | PRN
  Administered 2015-11-15 (×3): 2 mg via INTRAVENOUS
  Filled 2015-11-14 (×3): qty 1

## 2015-11-14 MED ORDER — ASPIRIN 81 MG PO CHEW
81.0000 mg | CHEWABLE_TABLET | ORAL | Status: AC
  Administered 2015-11-15: 81 mg via ORAL
  Filled 2015-11-14: qty 1

## 2015-11-14 MED ORDER — HYDROMORPHONE HCL 1 MG/ML IJ SOLN
1.0000 mg | Freq: Once | INTRAMUSCULAR | Status: AC
  Administered 2015-11-14: 1 mg via INTRAVENOUS
  Filled 2015-11-14: qty 1

## 2015-11-14 MED ORDER — IOHEXOL 350 MG/ML SOLN
80.0000 mL | Freq: Once | INTRAVENOUS | Status: AC | PRN
  Administered 2015-11-14: 80 mL via INTRAVENOUS

## 2015-11-14 MED ORDER — PANTOPRAZOLE SODIUM 40 MG PO TBEC
40.0000 mg | DELAYED_RELEASE_TABLET | Freq: Two times a day (BID) | ORAL | Status: DC
  Administered 2015-11-15 – 2015-11-17 (×3): 40 mg via ORAL
  Filled 2015-11-14 (×5): qty 1

## 2015-11-14 MED ORDER — PENTAFLUOROPROP-TETRAFLUOROETH EX AERO
1.0000 "application " | INHALATION_SPRAY | CUTANEOUS | Status: DC | PRN
  Filled 2015-11-14: qty 30

## 2015-11-14 MED ORDER — ACETAMINOPHEN 325 MG PO TABS: 650.0000 mg | ORAL_TABLET | ORAL | Status: DC | PRN

## 2015-11-14 MED ORDER — SODIUM CHLORIDE 0.9% FLUSH: 3.0000 mL | INTRAVENOUS | Status: DC | PRN

## 2015-11-14 MED ORDER — PANTOPRAZOLE SODIUM 40 MG PO TBEC
40.0000 mg | DELAYED_RELEASE_TABLET | Freq: Every day | ORAL | Status: DC
  Filled 2015-11-14: qty 1

## 2015-11-14 MED ORDER — NEPRO/CARBSTEADY PO LIQD: 237.0000 mL | Freq: Two times a day (BID) | ORAL | Status: DC

## 2015-11-14 MED ORDER — HYDROXYZINE HCL 50 MG/ML IM SOLN
25.0000 mg | Freq: Four times a day (QID) | INTRAMUSCULAR | Status: DC | PRN
  Filled 2015-11-14: qty 0.5

## 2015-11-14 MED ORDER — ASPIRIN EC 81 MG PO TBEC
81.0000 mg | DELAYED_RELEASE_TABLET | Freq: Every day | ORAL | Status: DC
  Administered 2015-11-16: 81 mg via ORAL
  Filled 2015-11-14 (×2): qty 1

## 2015-11-14 NOTE — Procedures (Signed)
  I was present at this dialysis session, have reviewed the session itself and made  appropriate changes Rob Yidel Teuscher MD [facility] pager [phone]    cell [phone] 11/14/2015, 1:57 PM    

## 2015-11-14 NOTE — Consult Note (Signed)
CARDIOLOGY CONSULT NOTE   Patient ID: Trevor [name] MRN: [id number] DOB/AGE: 12/21/1950 65 y.o.  Admit date: 11/13/2015  Primary Physician   [name], MD Primary Cardiologist   [name] Reason for Consultation   Chest pain Requesting Physician  Dr. Janee Morn  HPI: Trevor [name] is a 65 y.o. male with a history of HTN, ESRD on HD, chronic A. Fib, prior history of DVT/PE, GERD, gout, combined systolic and diastolic heart failure, OSA, syncopy, hepatosplenomegaly, CAD status post remote MI in 1990, chronic abdominal pain who presented to [facility] ED 3/7 with complaint of chest pain/abdominal pain.  He underwent DCCV to NSR on 08/29/13. He then developed a large hematoma on his back and fevers and cough and presented to the ER on 09/01/13. He was taken off Coumadin due to large hematoma significant anemia and was then on ASA. Not interested in taking Coumadin because of previous hematoma.   Admitted 10/14/15 - 10/19/15 --> seen in the hospital by Dr. [place] for volume overload in the setting of severe abdominal pain. Plan to get colonoscopy and endoscopy.  He returned to the emergency room 10/26/15 with a syncopal episode that lasted less than 1 minute in setting of missed hemodialysis and hyperkalemia. Discharge home from ED.  EF 25-30% by echo 09/04/14. Repeat echocardiogram done yesterday 11/13/15 showed improved in left ventricular function to 30-35%, diffuse hypokinesis, aortic root dilation to 39 mm, dilation of ascending aorta, moderately dilated right ventricle, moderately dilated right atrium, PA peak pressure of moderately increased to 55 mmHg. Travel pericardial effusion noted.  Yesterday morning 3/7 patient started to having chest pain. Seems epigastric area. He describes the pain as a sharp associated with nausea and followed by vomiting. Worsened with deep breath. Also admits to having intermittent lower extremity pain, shortness of breath and dizziness. He takes loss  of pain medicine. Few episode of vomiting. Questionable episode of syncope yesterday.  EKG on presentation shows A. fib at controlled ventricular rate and T-wave inversion in lead V5 and V6. Appears chronic. Repeat EKG this morning showed resolution of T wave inversion. No acute changes. Troponin of 0.06 --> 0.06.  Chest x-ray clear. Pancytopenia.   Abdominal ultrasound showed nodular hepatic contours, with diffuse gallbladder wall thickening and mild splenomegaly, consistent with chronic liver disease; small volume perihepatic ascites; ring down artifact in the gallbladder wall, may reflect adenomyomatosis, shrunken echogenic kidneys consistent chronic renal disease. CTA showed no pulmonary embolus; prominent right heart distension with contrast refluxing into the hepatic veins and IVC consistent with right heart failure. This appears chronic but advanced in degree; Small pericardial effusion; ultifocal calcified mediastinal adenopathy, sequela of granulomatous disease versus sarcoidosis; chronic hepatosplenomegaly, small volume intra-abdominal ascites.  Past Medical History  Diagnosis Date  . Anemia   . AVF (arteriovenous fistula) (HCC)     Left  . Secondary hyperparathyroidism (HCC)   . Hypovitaminosis D   . Hypertensive urgency     H/o  . CHF (congestive heart failure) (HCC)     EF 20-25%  . Exertional shortness of breath     "related to infection in my lungs right now" (05/03/2013)  . History of gout     "before I started doing the dialysis" (05/03/2013)  . Sleep apnea   . GERD (gastroesophageal reflux disease)   . Syncope     felt secondary to residual anesthesia the day before - 2D echo unremarkable  . Pulmonary embolism (HCC)     with right DVT secondary  to recent surgery  . Atrial fibrillation (HCC)     not on coumadin due to large spontaneous hematoma on back and anemia  . Myocardial infarction (HCC) 90's  . Coronary artery disease   . Dysrhythmia     afib  . Peripheral  vascular disease (HCC)     dvt leg 12/14  . Hypertension   . ESRD (end stage renal disease) on dialysis (HCC)     [facility] mon/wed/fri  . Atrial flutter (HCC) 04/27/2013  . S/P repair of ventral hernia 03/21/2014  . Hereditary and idiopathic peripheral neuropathy 08/29/2015  . Diverticulosis   . Hepatosplenomegaly 10/18/2015    Workup per Dr Kaplan/ GI in Dec 2015 > abd pain improved after paracentesis x 2 (1.2L, 1.3L). Had negative hepA/ hep B/ hep C testing. For hepatosplenomagealy pt underwent transjugular liver biopsy by IR with hepatic vein wedge pressure measurement which showed elevated right heart and free hepatic venous pressures likely due to right heart failure.  There was no evidence of portal hypertension. Liver bx originally was reported as "End stage liver disease/ cirrhosis", then was corrected > "No evidence of cirrhosis".  The corrected liver biopsy result reads "SINUSOIDAL DILATATION WITH SCATTERED FOCI OF HEPATITIS".  Last GI visit was Aug 2016 for bloating/ abd pain, noted he had a normal gastric empty study.  -Right lobe liver lesion. Felt to be hemangioma, Biopsy pending.  -thrombocytopenia. Dates back to 08/2013.  -ESRD. MWF HD.  -biventricular heart failure, acute right sided heart failure. Daily HD to address volume overload.       Past Surgical History  Procedure Laterality Date  . Av fistula placement Left     Dr. Charlean Sanfilippo; "I've had 2 on the left' (05/03/2013)  . Av fistula placement Right ~ 2011  . Knee arthroscopy Left   . Avgg removal Right 05/04/2013    Procedure: REMOVAL OF ARTERIOVENOUS Fistula Right Arm;  Surgeon: [name], MD;  Location: [facility] OR;  Service: Vascular;  Laterality: Right;  . Insertion of dialysis catheter Right 05/04/2013    Procedure: INSERTION OF DIALYSIS CATHETER;  Surgeon: [name], MD;  Location: [facility] OR;  Service: Vascular;  Laterality: Right;  . Colonoscopy  10-29-2005    Hx: of  . Bascilic vein transposition Left 06/27/2013     Procedure: BASCILIC VEIN TRANSPOSITION;  Surgeon: [name], MD;  Location: [facility] OR;  Service: Vascular;  Laterality: Left;  . Cardioversion N/A 08/29/2013    Procedure: CARDIOVERSION;  Surgeon: [name], MD;  Location: [facility] ENDOSCOPY;  Service: Cardiovascular;  Laterality: N/A;  . Removal of a dialysis catheter  2/15  . Hernia repair  03/21/14    Umbilical hernia-Dr. Derrell Lolling  . Umbilical hernia repair N/A 03/21/2014    Procedure: LAPAROSCOPIC UMBILICAL HERNIA REPAIR WITH MESH;  Surgeon: Axel Filler, MD;  Location: [facility] OR;  Service: General;  Laterality: N/A;  . Insertion of mesh N/A 03/21/2014    Procedure: INSERTION OF MESH;  Surgeon: Axel Filler, MD;  Location: [facility] OR;  Service: General;  Laterality: N/A;  . Venogram Left 05/26/2013    Procedure: VENOGRAM;  Surgeon: Fransisco Hertz, MD;  Location: [facility] CATH LAB;  Service: Cardiovascular;  Laterality: Left;    Allergies  Allergen Reactions  . Pork-Derived Products Nausea And Vomiting    Culture    I have reviewed the patient's current medications . aspirin EC  81 mg Oral Daily  . B-complex with vitamin C  1 tablet Oral Daily  .  doxercalciferol  6 mcg Intravenous Q M,W,F-HD  . feeding supplement (NEPRO CARB STEADY)  237 mL Oral BID BM  . labetalol  200 mg Oral Daily  . pantoprazole  40 mg Oral Daily  . sevelamer carbonate  2,400 mg Oral TID WC   . sodium chloride 75 mL/hr at 11/14/15 0630   acetaminophen, hydrOXYzine, morphine injection, nitroGLYCERIN, oxyCODONE-acetaminophen  Prior to Admission medications   Medication Sig Start Date End Date Taking? Authorizing Provider  B Complex-C (B-COMPLEX WITH VITAMIN C) tablet Take 1 tablet by mouth daily.   Yes Historical Provider, MD  labetalol (NORMODYNE) 200 MG tablet Take 200 mg by mouth daily.    Yes Historical Provider, MD  Nutritional Supplements (FEEDING SUPPLEMENT, NEPRO CARB STEADY,) LIQD Take 237 mLs by mouth 2 (two) times daily between meals. 10/19/15  Yes Nishant  Dhungel, MD  oxyCODONE-acetaminophen (PERCOCET/ROXICET) 5-325 MG tablet Take 1 tablet by mouth every 6 (six) hours as needed. Pain 10/29/15  Yes Historical Provider, MD  pantoprazole (PROTONIX) 40 MG tablet Take 1 tablet (40 mg total) by mouth daily. 10/19/15  Yes Nishant Dhungel, MD  sevelamer carbonate (RENVELA) 800 MG tablet Take 4 tablets (3,200 mg total) by mouth 3 (three) times daily with meals. Patient taking differently: Take 2,400 mg by mouth 3 (three) times daily with meals.  09/12/14  Yes Stephani Police, PA-C     Social History   Social History  . Marital Status: Single    Spouse Name: N/A  . Number of Children: 4  . Years of Education: N/A   Occupational History  . Disabled    Social History Main Topics  . Smoking status: Former Smoker -- 0.25 packs/day for .5 years    Types: Cigarettes    Quit date: 09/08/1972  . Smokeless tobacco: Never Used  . Alcohol Use: No     Comment: 05/03/2013 "haven't had a glass of wine in ~ 3 months or so; sometimes will have one w/dinner"  . Drug Use: No  . Sexual Activity: Yes    Birth Control/ Protection: None   Other Topics Concern  . Not on file   Social History Narrative   Former smoker- quit over 30 yrs ago   Patient is on disability    Family Status  Relation Status Death Age  . Father Deceased 58    No CAD  . Sister Alive   . Sister Alive   . Paternal Grandmother Deceased   . Mother Alive   . Brother Alive   . Other Alive   . Sister Alive   . Brother Alive   . Brother Alive    Family History  Problem Relation Age of Onset  . Hypertension Father   . Kidney disease Father   . Allergies Father   . Deep vein thrombosis Sister   . Pulmonary embolism Sister   . Diabetes Paternal Grandmother        ROS:  Full 14 point review of systems complete and found to be negative unless listed above.  Physical Exam: Blood pressure 113/96, pulse 27, temperature 99.1 F (37.3 C), temperature source Oral, resp. rate 20, SpO2  100 %.  General: Chronically ill-appearing  male in no acute distress Head: Eyes PERRLA, No xanthomas. Normocephalic and atraumatic, oropharynx without edema or exudate.  Lungs: Resp regular and unlabored, CTA. Heart: Irregular no s3, s4, or systolic and diastolic murmur  Neck: No carotid bruits. No lymphadenopathy.  No JVD. Abdomen: Bowel sounds present, distended, diffuse tenderness throughout,  mostly at epigastric area with guarding. Msk:  No spine or cva tenderness. No weakness, no joint deformities or effusions. Extremities: No clubbing, cyanosis or edema. DP/PT/Radials 2+ and equal bilaterally. Neuro: Alert and oriented X 3. No focal deficits noted. Psych:  Good affect, responds appropriately Skin: No rashes or lesions noted.  Labs:   Lab Results  Component Value Date   WBC 2.1* 11/14/2015   HGB 11.0* 11/14/2015   HCT 32.8* 11/14/2015   MCV 83.7 11/14/2015   PLT 95* 11/14/2015    Recent Labs  11/14/15 0345  INR 1.47    Recent Labs Lab 11/13/15 1718  NA 137  K 5.0  CL 96*  CO2 25  BUN 45*  CREATININE 9.60*  CALCIUM 9.6  PROT 7.7  BILITOT 1.6*  ALKPHOS 155*  ALT 14*  AST 31  GLUCOSE 90  ALBUMIN 3.3*   MAGNESIUM  Date Value Ref Range Status  09/02/2014 2.4 1.5 - 2.5 mg/dL Final    Recent Labs  45/03/88 2000 11/14/15 0125 11/14/15 0345 11/14/15 0837  TROPONINI 0.06* 0.06* 0.06* 0.06*    Recent Labs  11/13/15 1717  TROPIPOC 0.06   Lab Results  Component Value Date   CHOL 72 11/14/2015   HDL 31* 11/14/2015   LDLCALC 32 11/14/2015   TRIG 47 11/14/2015   LIPASE  Date/Time Value Ref Range Status  11/14/2015 03:45 AM 38 11 - 51 U/L Final   Echo: 11/13/15 LV EF: 30% - 35%  ------------------------------------------------------------------- Indications: (R01.1).  ------------------------------------------------------------------- History: PMH: ESRD. Acquired from the patient and from the patient&'s chart. Syncope and murmur.  Coronary artery disease. Congestive heart failure. PMH: Myocardial infarction. Risk factors: Hypertension.  ------------------------------------------------------------------- Study Conclusions  - Left ventricle: The cavity size was normal. There was mild focal  basal hypertrophy of the septum. Systolic function was moderately  to severely reduced. The estimated ejection fraction was in the  range of 30% to 35%. Diffuse hypokinesis. - Ventricular septum: The contour showed diastolic flattening. - Aortic valve: Valve mobility was restricted. There was trivial  regurgitation. - Aorta: Aortic root dimension: 39 mm (ED). - Ascending aorta: The ascending aorta was mildly dilated. - Left atrium: The atrium was moderately dilated. - Right ventricle: The cavity size was moderately dilated. Wall  thickness was normal. - Right atrium: The atrium was moderately dilated. - Tricuspid valve: There was severe regurgitation. - Pulmonary arteries: Systolic pressure was moderately increased.  PA peak pressure: 55 mm Hg (S). - Pericardium, extracardiac: A trivial pericardial effusion was  identified posterior to the heart.  ECG:  Vent. rate 91 BPM PR interval * ms QRS duration 114 ms QT/QTc 428/527 ms P-R-T axes -1 130 92  Radiology:  Dg Chest 2 View  11/13/2015  CLINICAL DATA:  Chest pain and shortness of breath today, cough for 1 month, hypertension, CHF, gout, GERD, prior pulmonary embolism, coronary artery disease post MI, atrial fibrillation/flutter, end-stage renal disease on dialysis EXAM: CHEST  2 VIEW COMPARISON:  10/31/2015 FINDINGS: Enlargement of cardiac silhouette with pulmonary vascular congestion. Calcified tortuous aorta. Calcified RIGHT paratracheal lymph nodes again seen. Interstitial infiltrates in the perihilar regions likely reflecting mild chronic CHF. No segmental consolidation, pleural effusion or pneumothorax. Osseous mineralization normal without acute bony  abnormality. IMPRESSION: Enlargement of cardiac silhouette with pulmonary venous hypertension and mild chronic failure. No new abnormalities. Electronically Signed   By: Ulyses Southward M.D.   On: 11/13/2015 17:06   Ct Angio Chest Pe W/cm &/or Wo Cm  11/14/2015  CLINICAL  DATA:  Pleuritic chest pain. History of pulmonary embolus and DVT. EXAM: CT ANGIOGRAPHY CHEST WITH CONTRAST TECHNIQUE: Multidetector CT imaging of the chest was performed using the standard protocol during bolus administration of intravenous contrast. Multiplanar CT image reconstructions and MIPs were obtained to evaluate the vascular anatomy. CONTRAST:  80mL OMNIPAQUE IOHEXOL 350 MG/ML SOLN COMPARISON:  Chest radiograph 1 day prior.  Chest CT 09/05/2014 FINDINGS: Resolution of previous filling defect in right middle lobe pulmonary artery. No new filling defects or acute pulmonary embolus. Multi chamber cardiomegaly, with prominent right atrial dilatation and significant contrast refluxing into the hepatic veins and IVC. Small pericardial effusion. Thoracic aorta with scattered atherosclerosis. No aneurysm. Coronary artery calcifications are seen. Extensive calcified mediastinal and bilateral hilar adenopathy. Calcified granuloma in the right middle lobe. Heterogeneous lower lobe ground-glass opacities and bronchial thickening. A chronic triangular opacity in the posterior medial right upper lobe adjacent to right pleural thickening/minimal effusion, most consistent with chronic atelectasis. Evaluation of the upper abdomen demonstrates hepatosplenomegaly and small volume perihepatic ascites. Atrophic kidneys. There are no acute or suspicious osseous abnormalities. Increased bone mineral density. Review of the MIP images confirms the above findings. IMPRESSION: 1. No pulmonary embolus. 2. Prominent right heart distension with contrast refluxing into the hepatic veins and IVC consistent with right heart failure. This appears chronic but advanced in  degree. 3. Small pericardial effusion. 4. Multifocal calcified mediastinal adenopathy, sequela of granulomatous disease versus sarcoidosis. 5. Chronic hepatosplenomegaly. Small volume intra-abdominal ascites. Electronically Signed   By: Rubye Oaks M.D.   On: 11/14/2015 03:45   US Abdomen Complete  11/14/2015  CLINICAL DATA:  Abdominal distension. EXAM: ABDOMEN ULTRASOUND COMPLETE COMPARISON:  CT 05/02/2016. FINDINGS: Gallbladder: Physiologically distended. Diffuse wall thickening measuring up to 9 mm. No gallstones visualized. No intraluminal sludge. Ring down artifact noted about the fundus. No sonographic Murphy sign noted by sonographer. Small amount of pericholecystic ascites. Common bile duct: Diameter: 3-4 mm. Liver: Hypodense lesion on CT not well seen sonographically. Within normal limits in parenchymal echogenicity. Nodular hepatic contours. Normal directional flow in the main portal vein. IVC: Prominent in diameter. Pancreas: Not well visualized. Spleen: Prominent size measuring 13.2 x 6.4 x 6.2 cm, splenic volume 274 mL. Right Kidney: Length: 8.3 cm. Atrophic with thinning of the renal parenchyma and increased echogenicity, patient with history of chronic renal disease on dialysis. No mass or hydronephrosis visualized. Left Kidney: Length: 6.6 cm. Atrophic with thinning of the renal parenchyma and increased renal echogenicity. Probable cyst arising from the lateral kidney measuring 1.9 cm. No solid mass or hydronephrosis visualized. Abdominal aorta: No aneurysm visualized. Proximal aorta measures 3 cm, mid and distal aorta obscured. Other findings: Small volume perihepatic ascites. IMPRESSION: 1. Nodular hepatic contours, with diffuse gallbladder wall thickening and mild splenomegaly. Findings consistent with chronic liver disease. Small volume perihepatic ascites. 2. Ring down artifact in the gallbladder wall, may reflect adenomyomatosis. 3. Shrunken echogenic kidneys consistent chronic renal  disease. Electronically Signed   By: Rubye Oaks M.D.   On: 11/14/2015 01:22    ASSESSMENT AND PLAN:   \  Trevor [name] is a 65 y.o. male with a history of HTN, ESRD on HD, chronic A. Fib, prior history of DVT/PE, GERD, gout, combined systolic and diastolic heart failure, OSA, syncopy, hepatosplenomegaly, CAD status post remote MI in 1990, chronic abdominal pain who presented to [facility] ED 3/7 with complaint of chest pain/abdominal pain.  1. Chest pain: The patient's chest pain seems more of epigastric pain.  EKG without acute abnormality. Troponin with flat trend (0.06 x 4). Likely due to elevated serum creatinine and volume overload. His main complaint is abdominal pain and he takes many pain medication. Patient has a pancytopenia with electrolytes abnormality.  CTA negative for PE.  2. Chronic biventricular heart failure with acute right-sided heart failure :Abdominal ultrasound shows nodular hepatic contour with call for blood thickening and splenomegaly. Findings consistent with chronic liver disease. CTA negative for PE with evidence of right-sided heart failure. Pending lower extremity venous Doppler to rule out DVT. Echocardiogram done yesterday shows improvement in left ventricular function to 30-35% (EF 25-30% by echo 09/04/14), diffuse hypokinesis, aortic root dilation to 39 mm, dilation of ascending aorta, moderately dilated right ventricle, moderately dilated right atrium, PA peak pressure of moderately increased to 55 mmHg. Travel pericardial effusion noted. Seems patient with benefit from right-sided heart cath. We will discuss with M.D. for further plan and management.  3. Chronic A. Fib: Rate is controlled. He has refused Coumadin after GI bleed.  4. Intractable abdominal pain: He is planned to have a colonoscope in 12/04/15.  5: Syncope:  - First episode occurred in a setting of A. fib RVR 2014. - Seen in ED 2/17 for syncope episode at that time his blood pressure  was low. - Murmur noted on exam. Echocardiogram done yesterday shows an aortic valve calcification without stenosis. No mitral valve stenosis or regurgitation. - Questionable syncope episode yesterday prior to presentation.    Otherwise per primary:   Obstructive sleep apnea   Essential hypertension   Secondary renal hyperparathyroidism (HCC)   ESRD on hemodialysis (HCC)   GERD (gastroesophageal reflux disease)   DVT of lower extremity (deep venous thrombosis) RLE   Pulmonary embolism (HCC)   Ascites   Pancytopenia (HCC)   Abdominal pain, chronic, epigastric   Chronic atrial fibrillation (HCC)   Abdominal distension   Palliative care encounter   Bloating   Hepatic cirrhosis (HCC)   Hepatosplenomegaly   Chronic combined systolic and diastolic heart failure (HCC)   SignedManson Passey, PA 11/14/2015, 10:35 AM Pager [phone]  Co-Sign MD

## 2015-11-14 NOTE — ED Notes (Signed)
Per Cardiology patient has no planned procedures and he is cleared to eat. Renal diet ordered.

## 2015-11-14 NOTE — ED Notes (Signed)
Dr Niu at bedside 

## 2015-11-14 NOTE — H&P (Addendum)
[facility] History and Physical  Trevor [name] UQJ:[id number] DOB: 03/29/1951 DOA: 11/13/2015  Referring physician: ED physician PCP: [name], MD  Specialists:   Chief Complaint: chest pain and abdominal pain  HPI: Trevor [name] is a 65 y.o. male with PMH of end-stage renal disease on HD (MWF), biventricular heart failure, A. 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  Patient reports that he started having sudden onset chest pain since yesterday morning. His chest pain is located in the frontal chest, 9 out of 10 in severity, constant, pleuritic, nonradiating. It is aggravated by deep breath. It is associated with a shortness of breath. He also has intermittent bilateral calf pain. He also reports having constant abdominal pain, nausea and vomiting, but no diarrhea. He vomited twice. Patient does not have fever, chills, symptoms of UTI or unilateral weakness.  In ED, patient was found to have troponin 0.06-->0.06, pancytopenia with WBC 2.7, hemoglobin 11.3, platelet of 95, temperature 99.1, creatinine 9.60, bicarbonate of 25, BUN 45, lactate 1.49--> 1.53. Chest x-ray showed no infiltration. Abdominal ultrasound showed nodular hepatic contours, with diffuse gallbladder wall thickening and mild splenomegaly, consistent with chronic liver disease; small volume perihepatic ascites; ring down artifact in the gallbladder wall, may reflect adenomyomatosis, shrunken echogenic kidneys consistent chronic renal disease. CTA showed no pulmonary embolus; prominent right heart distension with contrast refluxing into the hepatic veins and IVC consistent with right heart failure. This appears chronic but advanced in degree; Small pericardial effusion;  ultifocal calcified mediastinal adenopathy, sequela of granulomatous disease versus sarcoidosis; chronic hepatosplenomegaly, small volume intra-abdominal ascites.  EKG: Independently reviewed. QTC 509, atrial fibrillation, bifascicular T-wave in V2-V4, mild T-wave inversion in V5-V6.  Where does patient live?   At home  Can patient participate in ADLs? Some   Review of Systems:   General: no fevers, chills, no changes in body weight, has poor appetite, has fatigue HEENT: no blurry vision, hearing changes or sore throat Pulm: has dyspnea, coughing, no wheezing CV: has chest pain, no palpitations Abd: has nausea, vomiting, abdominal pain, no diarrhea, constipation GU: no dysuria, burning on urination, increased urinary frequency, hematuria  Ext: no leg edema Neuro: no unilateral weakness, numbness, or tingling, no vision change or hearing loss Skin: no rash MSK: No muscle spasm, no deformity, no limitation of range of movement in spin Heme: No easy bruising.  Travel history: No recent long distant travel.  Allergy:  Allergies  Allergen Reactions  . Pork-Derived Products Nausea And Vomiting    Culture    Past Medical History  Diagnosis Date  . Anemia   . AVF (arteriovenous fistula) (HCC)     Left  . Secondary hyperparathyroidism (HCC)   . Hypovitaminosis D   . Hypertensive urgency     H/o  . CHF (congestive heart failure) (HCC)     EF 20-25%  . Exertional shortness of breath     "related to infection in my lungs right now" (05/03/2013)  . History of gout     "before I started doing the dialysis" (05/03/2013)  . Sleep apnea   . GERD (gastroesophageal reflux disease)   . Syncope     felt secondary to residual anesthesia the day before - 2D echo unremarkable  . Pulmonary embolism (HCC)     with right DVT secondary to recent surgery  .  Atrial fibrillation (HCC)     not on coumadin due to large spontaneous hematoma on back and anemia  . Myocardial infarction (HCC) 90's  .  Coronary artery disease   . Dysrhythmia     afib  . Peripheral vascular disease (HCC)     dvt leg 12/14  . Hypertension   . ESRD (end stage renal disease) on dialysis (HCC)     [facility] mon/wed/fri  . Atrial flutter (HCC) 04/27/2013  . S/P repair of ventral hernia 03/21/2014  . Hereditary and idiopathic peripheral neuropathy 08/29/2015  . Diverticulosis   . Hepatosplenomegaly 10/18/2015    Workup per Dr Kaplan/ GI in Dec 2015 > abd pain improved after paracentesis x 2 (1.2L, 1.3L). Had negative hepA/ hep B/ hep C testing. For hepatosplenomagealy pt underwent transjugular liver biopsy by IR with hepatic vein wedge pressure measurement which showed elevated right heart and free hepatic venous pressures likely due to right heart failure.  There was no evidence of portal hypertension. Liver bx originally was reported as "End stage liver disease/ cirrhosis", then was corrected > "No evidence of cirrhosis".  The corrected liver biopsy result reads "SINUSOIDAL DILATATION WITH SCATTERED FOCI OF HEPATITIS".  Last GI visit was Aug 2016 for bloating/ abd pain, noted he had a normal gastric empty study.  -Right lobe liver lesion. Felt to be hemangioma, Biopsy pending.  -thrombocytopenia. Dates back to 08/2013.  -ESRD. MWF HD.  -biventricular heart failure, acute right sided heart failure. Daily HD to address volume overload.      Past Surgical History  Procedure Laterality Date  . Av fistula placement Left     Dr. Charlean Sanfilippo; "I've had 2 on the left' (05/03/2013)  . Av fistula placement Right ~ 2011  . Knee arthroscopy Left   . Avgg removal Right 05/04/2013    Procedure: REMOVAL OF ARTERIOVENOUS Fistula Right Arm;  Surgeon: [name], MD;  Location: [facility] OR;  Service: Vascular;  Laterality: Right;  . Insertion of dialysis catheter Right 05/04/2013    Procedure: INSERTION OF DIALYSIS CATHETER;  Surgeon: [name], MD;  Location: [facility] OR;  Service: Vascular;  Laterality: Right;  . Colonoscopy  10-29-2005     Hx: of  . Bascilic vein transposition Left 06/27/2013    Procedure: BASCILIC VEIN TRANSPOSITION;  Surgeon: [name], MD;  Location: [facility] OR;  Service: Vascular;  Laterality: Left;  . Cardioversion N/A 08/29/2013    Procedure: CARDIOVERSION;  Surgeon: [name], MD;  Location: [facility] ENDOSCOPY;  Service: Cardiovascular;  Laterality: N/A;  . Removal of a dialysis catheter  2/15  . Hernia repair  03/21/14    Umbilical hernia-Dr. Derrell Lolling  . Umbilical hernia repair N/A 03/21/2014    Procedure: LAPAROSCOPIC UMBILICAL HERNIA REPAIR WITH MESH;  Surgeon: Axel Filler, MD;  Location: [facility] OR;  Service: General;  Laterality: N/A;  . Insertion of mesh N/A 03/21/2014    Procedure: INSERTION OF MESH;  Surgeon: Axel Filler, MD;  Location: [facility] OR;  Service: General;  Laterality: N/A;  . Venogram Left 05/26/2013    Procedure: VENOGRAM;  Surgeon: Fransisco Hertz, MD;  Location: [facility] CATH LAB;  Service: Cardiovascular;  Laterality: Left;    Social History:  reports that he quit smoking about 43 years ago. His smoking use included Cigarettes. He has a .125 pack-year smoking history. He has never used smokeless tobacco. He reports that he does not drink alcohol or use illicit drugs.  Family History:  Family History  Problem Relation Age  of Onset  . Hypertension Father   . Kidney disease Father   . Allergies Father   . Deep vein thrombosis Sister   . Pulmonary embolism Sister   . Diabetes Paternal Grandmother      Prior to Admission medications   Medication Sig Start Date End Date Taking? Authorizing Provider  B Complex-C (B-COMPLEX WITH VITAMIN C) tablet Take 1 tablet by mouth daily.   Yes Historical Provider, MD  labetalol (NORMODYNE) 200 MG tablet Take 200 mg by mouth daily.    Yes Historical Provider, MD  Nutritional Supplements (FEEDING SUPPLEMENT, NEPRO CARB STEADY,) LIQD Take 237 mLs by mouth 2 (two) times daily between meals. 10/19/15  Yes Nishant Dhungel, MD  oxyCODONE-acetaminophen  (PERCOCET/ROXICET) 5-325 MG tablet Take 1 tablet by mouth every 6 (six) hours as needed. Pain 10/29/15  Yes Historical Provider, MD  pantoprazole (PROTONIX) 40 MG tablet Take 1 tablet (40 mg total) by mouth daily. 10/19/15  Yes Nishant Dhungel, MD  sevelamer carbonate (RENVELA) 800 MG tablet Take 4 tablets (3,200 mg total) by mouth 3 (three) times daily with meals. Patient taking differently: Take 2,400 mg by mouth 3 (three) times daily with meals.  09/12/14  Yes Stephani Police, PA-C    Physical Exam: Filed Vitals:   11/14/15 0030 11/14/15 0132 11/14/15 0200 11/14/15 0230  BP: 106/85 127/99 119/95 122/89  Pulse:   86 92  Temp:      TempSrc:      Resp: 29 21 20 23   SpO2:  94%  91%   General: Not in acute distress. Dry mucus and membrane HEENT:       Eyes: PERRL, EOMI, no scleral icterus.       ENT: No discharge from the ears and nose, no pharynx injection, no tonsillar enlargement.        Neck: No JVD, no bruit, no mass felt. Heme: No neck lymph node enlargement. Cardiac: S1/S2, RRR, No murmurs, No gallops or rubs. Pulm: No rales, wheezing, rhonchi or rubs. Abd: Soft, distended, diffused tenderness, no rebound pain, no organomegaly, BS present. Ext: No pitting leg edema bilaterally. 2+DP/PT pulse bilaterally. Musculoskeletal: No joint deformities, No joint redness or warmth, no limitation of ROM in spin. Skin: No rashes.  Neuro: Alert, oriented X3, cranial nerves II-XII grossly intact, moves all extremities normally. Psych: Patient is not psychotic, no suicidal or hemocidal ideation.  Labs on Admission:  Basic Metabolic Panel:  Recent Labs Lab 11/13/15 1718  NA 137  K 5.0  CL 96*  CO2 25  GLUCOSE 90  BUN 45*  CREATININE 9.60*  CALCIUM 9.6   Liver Function Tests:  Recent Labs Lab 11/13/15 1718  AST 31  ALT 14*  ALKPHOS 155*  BILITOT 1.6*  PROT 7.7  ALBUMIN 3.3*   No results for input(s): LIPASE, AMYLASE in the last 168 hours. No results for input(s): AMMONIA in  the last 168 hours. CBC:  Recent Labs Lab 11/13/15 1704  WBC 2.7*  NEUTROABS 1.4*  HGB 11.3*  HCT 32.4*  MCV 84.4  PLT 95*   Cardiac Enzymes:  Recent Labs Lab 11/13/15 1704 11/13/15 2000 11/14/15 0125  TROPONINI 0.06* 0.06* 0.06*    BNP (last 3 results) No results for input(s): BNP in the last 8760 hours.  ProBNP (last 3 results) No results for input(s): PROBNP in the last 8760 hours.  CBG: No results for input(s): GLUCAP in the last 168 hours.  Radiological Exams on Admission: Dg Chest 2 View  11/13/2015  CLINICAL DATA:  Chest pain and shortness of breath today, cough for 1 month, hypertension, CHF, gout, GERD, prior pulmonary embolism, coronary artery disease post MI, atrial fibrillation/flutter, end-stage renal disease on dialysis EXAM: CHEST  2 VIEW COMPARISON:  10/31/2015 FINDINGS: Enlargement of cardiac silhouette with pulmonary vascular congestion. Calcified tortuous aorta. Calcified RIGHT paratracheal lymph nodes again seen. Interstitial infiltrates in the perihilar regions likely reflecting mild chronic CHF. No segmental consolidation, pleural effusion or pneumothorax. Osseous mineralization normal without acute bony abnormality. IMPRESSION: Enlargement of cardiac silhouette with pulmonary venous hypertension and mild chronic failure. No new abnormalities. Electronically Signed   By: Ulyses Southward M.D.   On: 11/13/2015 17:06   Ct Angio Chest Pe W/cm &/or Wo Cm  11/14/2015  CLINICAL DATA:  Pleuritic chest pain. History of pulmonary embolus and DVT. EXAM: CT ANGIOGRAPHY CHEST WITH CONTRAST TECHNIQUE: Multidetector CT imaging of the chest was performed using the standard protocol during bolus administration of intravenous contrast. Multiplanar CT image reconstructions and MIPs were obtained to evaluate the vascular anatomy. CONTRAST:  80mL OMNIPAQUE IOHEXOL 350 MG/ML SOLN COMPARISON:  Chest radiograph 1 day prior.  Chest CT 09/05/2014 FINDINGS: Resolution of previous filling  defect in right middle lobe pulmonary artery. No new filling defects or acute pulmonary embolus. Multi chamber cardiomegaly, with prominent right atrial dilatation and significant contrast refluxing into the hepatic veins and IVC. Small pericardial effusion. Thoracic aorta with scattered atherosclerosis. No aneurysm. Coronary artery calcifications are seen. Extensive calcified mediastinal and bilateral hilar adenopathy. Calcified granuloma in the right middle lobe. Heterogeneous lower lobe ground-glass opacities and bronchial thickening. A chronic triangular opacity in the posterior medial right upper lobe adjacent to right pleural thickening/minimal effusion, most consistent with chronic atelectasis. Evaluation of the upper abdomen demonstrates hepatosplenomegaly and small volume perihepatic ascites. Atrophic kidneys. There are no acute or suspicious osseous abnormalities. Increased bone mineral density. Review of the MIP images confirms the above findings. IMPRESSION: 1. No pulmonary embolus. 2. Prominent right heart distension with contrast refluxing into the hepatic veins and IVC consistent with right heart failure. This appears chronic but advanced in degree. 3. Small pericardial effusion. 4. Multifocal calcified mediastinal adenopathy, sequela of granulomatous disease versus sarcoidosis. 5. Chronic hepatosplenomegaly. Small volume intra-abdominal ascites. Electronically Signed   By: Rubye Oaks M.D.   On: 11/14/2015 03:45   US Abdomen Complete  11/14/2015  CLINICAL DATA:  Abdominal distension. EXAM: ABDOMEN ULTRASOUND COMPLETE COMPARISON:  CT 05/02/2016. FINDINGS: Gallbladder: Physiologically distended. Diffuse wall thickening measuring up to 9 mm. No gallstones visualized. No intraluminal sludge. Ring down artifact noted about the fundus. No sonographic Murphy sign noted by sonographer. Small amount of pericholecystic ascites. Common bile duct: Diameter: 3-4 mm. Liver: Hypodense lesion on CT not well  seen sonographically. Within normal limits in parenchymal echogenicity. Nodular hepatic contours. Normal directional flow in the main portal vein. IVC: Prominent in diameter. Pancreas: Not well visualized. Spleen: Prominent size measuring 13.2 x 6.4 x 6.2 cm, splenic volume 274 mL. Right Kidney: Length: 8.3 cm. Atrophic with thinning of the renal parenchyma and increased echogenicity, patient with history of chronic renal disease on dialysis. No mass or hydronephrosis visualized. Left Kidney: Length: 6.6 cm. Atrophic with thinning of the renal parenchyma and increased renal echogenicity. Probable cyst arising from the lateral kidney measuring 1.9 cm. No solid mass or hydronephrosis visualized. Abdominal aorta: No aneurysm visualized. Proximal aorta measures 3 cm, mid and distal aorta obscured. Other findings: Small volume perihepatic ascites. IMPRESSION: 1. Nodular hepatic contours, with diffuse gallbladder wall thickening and  mild splenomegaly. Findings consistent with chronic liver disease. Small volume perihepatic ascites. 2. Ring down artifact in the gallbladder wall, may reflect adenomyomatosis. 3. Shrunken echogenic kidneys consistent chronic renal disease. Electronically Signed   By: Rubye Oaks M.D.   On: 11/14/2015 01:22    Assessment/Plan Principal Problem:   Chest pain Active Problems:   Obstructive sleep apnea   Essential hypertension   Secondary renal hyperparathyroidism (HCC)   ESRD on hemodialysis (HCC)   GERD (gastroesophageal reflux disease)   DVT of lower extremity (deep venous thrombosis) RLE   Pulmonary embolism (HCC)   Ascites   Pancytopenia (HCC)   Abdominal pain, chronic, epigastric   Chronic atrial fibrillation (HCC)   Abdominal distension   Palliative care encounter   Bloating   Hepatic cirrhosis (HCC)   Hepatosplenomegaly   Chronic combined systolic and diastolic heart failure (HCC)   Chest pain: Potential differential diagnosis includes pulmonary embolism  given history of PE and DVT, and ACS given elevated troponin. No infiltration by chest x-ray. CTA showed no PE.  - will admit to Tele bed  - cycle CE q6 x3 and repeat her EKG in the am  - Nitroglycerin, Morphine, and aspirin - Risk factor stratification: will check FLP, UDS and A1C  - will not repeat 2d echo since he just had one yesterday, which showed EF 30-35 percent. - LE doppler to r/o DVT  NSTEMI and elevated trop: -see above  Abdominal pain: this is chronic issue. Pt was recently hospitalized from 2/5/ to 10/19/15. Per discharge summary, patient had CT of the abdomen and pelvis negative for acute findings but showed hepatosplenomegaly with mild fluid overload that might be contributing to symptoms. Patient had liver biopsy in 08/2014 which showed sinusoidal dilatation and scattered foci hepatitis and was negative for cirrhosis. Patient had a normal gastric imaging study in 2014. He had a colonoscopy in 2007 which showed sigmoid tics. His hepatitis serologies have been negative. FOBT negative. GI recommend outpt EGD and colonoscopy. -Percocet and Protonix. -check lipase -pain control  Hypertension: -Labetalol, Hydralazine prn  Atrial Fibrillation: CHA2DS2-VASc Score is 3, needs oral anticoagulation. Patient was on Coumadin, but stopped due to back hematoma. -Continue labetalol  Hepatic cirrhosis: Chart review indicates he saw gastroenterology August 2016. Note indicates stable hepatic function -f/u liver fx  End-stage renal disease on dialysis (MWF): Creatinine 9.60, potassium 5.0, BUN 45, bicarbonate 25. Patient has been compliant to dialysis. -left message to renal for HD today.  Pancytopenia: Likely related to cirrhosis and ESRD. No signs symptoms of active bleeding.  -F/u by CBC  GERD:. Chart review indicates GI recommended Pepcid in the past. Patient unable to tolerate -Protonix  Chronic biventricular heart failure predominantly right: Echo on 11/13/15 showed EF 30-35  percent. No leg edema. CHF is compensated. -Continue beta blocker -Volume management of by dialysis per renal  History DVT/PE: Previously on Coumadin. Discontinued secondary to spontaneous back hematoma and patient has declined. He reports intermittent bilateral calf pain. -Check lower extremity venous Doppler   DVT ppx: none (due to thrombocytopenia, patient cannot use heparin or Lovenox. Due to history of DVT, cannot use SCD). F/u LE venous doppler. If negative for dvt, please start SCD  Code Status: Full code Family Communication: None at bed side.  Disposition Plan: Admit to inpatient   Date of Service 11/14/2015    Lorretta Harp [facility] Pager [phone]  If 7PM-7AM, please contact night-coverage [email or web address] Password [email or web address] 11/14/2015, 4:14 AM

## 2015-11-14 NOTE — ED Notes (Signed)
Contacted bed control to make aware pt is going to dialysis and is still awaiting placement on floor.

## 2015-11-14 NOTE — Consult Note (Signed)
Renal Service Consult Note [facility]  Trevor [name] 11/14/2015 Trevor [name] D Requesting Physician:  Dr. Janee Morn  Reason for Consult:  ESRD pt with cough/ SOB/ CP HPI: The patient is a 65 y.o. year-old with hx of bivent CHF, ESRD on HD, HTN, afib who presented to ED with c/o SOB/ coughing and chest pain.  Possible syncopal event as well, not sure.  Came in to ED with 9/10 chest pain.  Seen by cardioloyg, did bedside echo w/o new findings.  Prob GI symptoms causing symptoms. Admitted and plan is for R heart cath per cardiology tomorrow.  Asked to see for dialysis.   Patient no acute SOB/ CP, fever/ chills / sweats, no abd pain , n/v/d.      Chart review: Feb 2017 - abd pain/ distension/ bloating > seen by GI, suggeted functional pain since present since vent hernia repair in 2014.  Normal GES 2014. Colonscopy 2007 w sigmoid tics. Hep B/C negative. FOBT neg.  Chron HSM, had liver bx 08/2014 which did not show cirrhosis, showed scattered foci hepatitis and sinusoidal dilatation. GI said inpt EGD would be low yield.  They will f/u in office as he is due for colon anyways. Bivent CHF LVEF 25-30%, chron afib, HTN on labetalol. ESRD MWF.  Jan 2016 - abd pain Trevor [name] ascites > bivent CHF. Liver biopsy was done and iniital report read as "cirrhosis" but later after d/c was corrected to "scattered foci hepatitis and sinusoidal dilatation", no cirrhosis. ECHO showed LVEF 25-30%, peak PAP 66 mmHg.  Cards saw patient. CTA chest neg for PE.  F/U outpatinet.  July 2015 -  Repair of ventral hernia, ESRD on HD. DC'd next day Dec 2014 - FUO, HTN, viral syndrome, RLE DVT, HTN, [facility] Oct 2014 - syncope, HTN, afib, CHF, anemia, DVT RLE, LUE hematoma, anemia of CKD Aug 2014 - HTN, new afib, fever > removal of AVF, TDC placement ; rx vanc IV x 2 wks   ROS  denies CP  no joint pain   no HA  no blurry vision  no rash  no diarrhea  no nausea/ vomiting  no dysuria  no difficulty  voiding  no change in urine color    Past Medical History  Past Medical History  Diagnosis Date  . Anemia   . AVF (arteriovenous fistula) (HCC)     Left  . Secondary hyperparathyroidism (HCC)   . Hypovitaminosis D   . Hypertensive urgency     H/o  . CHF (congestive heart failure) (HCC)     EF 20-25%  . Exertional shortness of breath     "related to infection in my lungs right now" (05/03/2013)  . History of gout     "before I started doing the dialysis" (05/03/2013)  . Sleep apnea   . GERD (gastroesophageal reflux disease)   . Syncope     felt secondary to residual anesthesia the day before - 2D echo unremarkable  . Pulmonary embolism (HCC)     with right DVT secondary to recent surgery  . Atrial fibrillation (HCC)     not on coumadin due to large spontaneous hematoma on back and anemia  . Myocardial infarction (HCC) 90's  . Coronary artery disease   . Dysrhythmia     afib  . Peripheral vascular disease (HCC)     dvt leg 12/14  . Hypertension   . ESRD (end stage renal disease) on dialysis (HCC)     [facility] mon/wed/fri  . Atrial  flutter (HCC) 04/27/2013  . S/P repair of ventral hernia 03/21/2014  . Hereditary and idiopathic peripheral neuropathy 08/29/2015  . Diverticulosis   . Hepatosplenomegaly 10/18/2015    Workup per Dr Kaplan/ GI in Dec 2015 > abd pain improved after paracentesis x 2 (1.2L, 1.3L). Had negative hepA/ hep B/ hep C testing. For hepatosplenomagealy pt underwent transjugular liver biopsy by IR with hepatic vein wedge pressure measurement which showed elevated right heart and free hepatic venous pressures likely due to right heart failure.  There was no evidence of portal hypertension. Liver bx originally was reported as "End stage liver disease/ cirrhosis", then was corrected > "No evidence of cirrhosis".  The corrected liver biopsy result reads "SINUSOIDAL DILATATION WITH SCATTERED FOCI OF HEPATITIS".  Last GI visit was Aug 2016 for bloating/ abd pain, noted he  had a normal gastric empty study.  -Right lobe liver lesion. Felt to be hemangioma, Biopsy pending.  -thrombocytopenia. Dates back to 08/2013.  -ESRD. MWF HD.  -biventricular heart failure, acute right sided heart failure. Daily HD to address volume overload.     Past Surgical History  Past Surgical History  Procedure Laterality Date  . Av fistula placement Left     Dr. Charlean Sanfilippo; "I've had 2 on the left' (05/03/2013)  . Av fistula placement Right ~ 2011  . Knee arthroscopy Left   . Avgg removal Right 05/04/2013    Procedure: REMOVAL OF ARTERIOVENOUS Fistula Right Arm;  Surgeon: [name], MD;  Location: [facility] OR;  Service: Vascular;  Laterality: Right;  . Insertion of dialysis catheter Right 05/04/2013    Procedure: INSERTION OF DIALYSIS CATHETER;  Surgeon: [name], MD;  Location: [facility] OR;  Service: Vascular;  Laterality: Right;  . Colonoscopy  10-29-2005    Hx: of  . Bascilic vein transposition Left 06/27/2013    Procedure: BASCILIC VEIN TRANSPOSITION;  Surgeon: [name], MD;  Location: [facility] OR;  Service: Vascular;  Laterality: Left;  . Cardioversion N/A 08/29/2013    Procedure: CARDIOVERSION;  Surgeon: [name], MD;  Location: [facility] ENDOSCOPY;  Service: Cardiovascular;  Laterality: N/A;  . Removal of a dialysis catheter  2/15  . Hernia repair  03/21/14    Umbilical hernia-Dr. Derrell Lolling  . Umbilical hernia repair N/A 03/21/2014    Procedure: LAPAROSCOPIC UMBILICAL HERNIA REPAIR WITH MESH;  Surgeon: Axel Filler, MD;  Location: [facility] OR;  Service: General;  Laterality: N/A;  . Insertion of mesh N/A 03/21/2014    Procedure: INSERTION OF MESH;  Surgeon: Axel Filler, MD;  Location: [facility] OR;  Service: General;  Laterality: N/A;  . Venogram Left 05/26/2013    Procedure: VENOGRAM;  Surgeon: Fransisco Hertz, MD;  Location: [facility] CATH LAB;  Service: Cardiovascular;  Laterality: Left;   Family History  Family History  Problem Relation Age of Onset  . Hypertension Father   . Kidney disease  Father   . Allergies Father   . Deep vein thrombosis Sister   . Pulmonary embolism Sister   . Diabetes Paternal Grandmother    Social History  reports that he quit smoking about 43 years ago. His smoking use included Cigarettes. He has a .125 pack-year smoking history. He has never used smokeless tobacco. He reports that he does not drink alcohol or use illicit drugs. Allergies  Allergies  Allergen Reactions  . Pork-Derived Products Nausea And Vomiting    Culture   Home medications Prior to Admission medications   Medication Sig Start Date End Date Taking? Authorizing Provider  B Complex-C (B-COMPLEX WITH VITAMIN C) tablet Take 1 tablet by mouth daily.   Yes Historical Provider, MD  labetalol (NORMODYNE) 200 MG tablet Take 200 mg by mouth daily.    Yes Historical Provider, MD  Nutritional Supplements (FEEDING SUPPLEMENT, NEPRO CARB STEADY,) LIQD Take 237 mLs by mouth 2 (two) times daily between meals. 10/19/15  Yes Nishant Dhungel, MD  oxyCODONE-acetaminophen (PERCOCET/ROXICET) 5-325 MG tablet Take 1 tablet by mouth every 6 (six) hours as needed. Pain 10/29/15  Yes Historical Provider, MD  pantoprazole (PROTONIX) 40 MG tablet Take 1 tablet (40 mg total) by mouth daily. 10/19/15  Yes Nishant Dhungel, MD  sevelamer carbonate (RENVELA) 800 MG tablet Take 4 tablets (3,200 mg total) by mouth 3 (three) times daily with meals. Patient taking differently: Take 2,400 mg by mouth 3 (three) times daily with meals.  09/12/14  Yes Stephani Police, PA-C   Liver Function Tests  Recent Labs Lab 11/13/15 1718 11/14/15 0838  AST 31 29  ALT 14* 13*  ALKPHOS 155* 144*  BILITOT 1.6* 1.4*  PROT 7.7 7.1  ALBUMIN 3.3* 3.2*    Recent Labs Lab 11/14/15 0345  LIPASE 38   CBC  Recent Labs Lab 11/13/15 1704 11/14/15 0838  WBC 2.7* 2.1*  NEUTROABS 1.4*  --   HGB 11.3* 11.0*  HCT 32.4* 32.8*  MCV 84.4 83.7  PLT 95* 95*   Basic Metabolic Panel  Recent Labs Lab 11/13/15 1718 11/14/15 0838  NA  137 137  K 5.0 5.3*  CL 96* 99*  CO2 25 19*  GLUCOSE 90 67  BUN 45* 51*  CREATININE 9.60* 10.71*  CALCIUM 9.6 9.3    Filed Vitals:   11/14/15 1230 11/14/15 1300 11/14/15 1305 11/14/15 1330  BP: 100/73 87/65 112/78 99/73  Pulse: 70 77 68 80  Temp:      TempSrc:      Resp: Weight:      SpO2: 98%      Exam Alert, +mild JVD No rash, cyanosis or gangrene Sclera anicteric, throat clear Chest clear bilat no rales or wheezing RRR loud 2-3 SEM no RG Abd soft notender no mass or ascites, liver down 8 cm GU defer Ext 1+ LE edema bilat Neuro alert, Ox 3, nonfocal     Dialysis: MWF [name]  4h  F200   500/800  90.5kg (lowest post wt 93kg last 2 wks)  2/2 bath Hep none [name] 6 ug Venofer 1000 IV q HD thru 3/10   Assessment: 1 Syncope 2 Chest pain 3 Chronic R / L heart failure -for R heart cath  4 Volume- excess, is up 6-7 kg but usually doesn't get to dry wt.  No resp compromise 5 ESRD on HD MWF 6 HTN cont labetalol  7 MBD cont renvela/ hectorol  8 Chron afib - no coumadin d/t hx bleeding 9 Chron abd pain 10 Chron hepatic/ splenic enlargement 11 Hx DVT / PE in the past   Plan - HD today, max UF  [name] MD [facility] pager [phone]    cell [phone] 11/14/2015, 1:57 PM

## 2015-11-14 NOTE — Progress Notes (Signed)
I have seen and assessed patient and agree with Dr.Niu's assessment and plan. Patient is 65 year old gentleman is showing end-stage renal disease on hemodialysis Monday Wednesday Friday, biventricular heart failure, atrial fib/flutter, and DVT/PE previously on Coumadin, hypertension, gastroesophageal reflux disease presented with chest pain and abdominal pain. Cardiac enzymes were cycled which were minimally elevated. Cardiology consulted and following the patient and recommended right heart catheterization tomorrow to assess PAP. Will also increase patient's Protonix to twice daily. Nephrology also following and patient currently in hemodialysis.

## 2015-11-14 NOTE — ED Notes (Signed)
Pt back on floor.

## 2015-11-15 ENCOUNTER — Encounter (HOSPITAL_COMMUNITY)
Admission: EM | Disposition: A | Discharge status: Home / Self Care | Payer: Self-pay | Source: Home / Self Care | Attending: Internal Medicine

## 2015-11-15 ENCOUNTER — Inpatient Hospital Stay (HOSPITAL_COMMUNITY): Payer: Medicare Other

## 2015-11-15 ENCOUNTER — Encounter (HOSPITAL_COMMUNITY): Payer: Self-pay | Admitting: Cardiovascular Disease

## 2015-11-15 DIAGNOSIS — M79661 Pain in right lower leg: Secondary | ICD-10-CM

## 2015-11-15 DIAGNOSIS — I5042 Chronic combined systolic (congestive) and diastolic (congestive) heart failure: Secondary | ICD-10-CM

## 2015-11-15 DIAGNOSIS — I429 Cardiomyopathy, unspecified: Secondary | ICD-10-CM

## 2015-11-15 DIAGNOSIS — R079 Chest pain, unspecified: Secondary | ICD-10-CM

## 2015-11-15 DIAGNOSIS — M79662 Pain in left lower leg: Secondary | ICD-10-CM

## 2015-11-15 DIAGNOSIS — D61818 Other pancytopenia: Secondary | ICD-10-CM

## 2015-11-15 DIAGNOSIS — R55 Syncope and collapse: Secondary | ICD-10-CM | POA: Insufficient documentation

## 2015-11-15 DIAGNOSIS — I428 Other cardiomyopathies: Secondary | ICD-10-CM | POA: Insufficient documentation

## 2015-11-15 HISTORY — PX: CARDIAC CATHETERIZATION: SHX172

## 2015-11-15 LAB — POCT I-STAT 3, VENOUS BLOOD GAS (G3P V)
Acid-Base Excess: 8 mmol/L — ABNORMAL HIGH (ref 0.0–2.0)
Bicarbonate: 33.4 mEq/L — ABNORMAL HIGH (ref 20.0–24.0)
O2 SAT: 53 %
PCO2 VEN: 47 mmHg (ref 45.0–50.0)
PO2 VEN: 27 mmHg — AB (ref 31.0–45.0)
TCO2: 35 mmol/L (ref 0–100)
pH, Ven: 7.46 — ABNORMAL HIGH (ref 7.250–7.300)

## 2015-11-15 LAB — RENAL FUNCTION PANEL
Albumin: 3.1 g/dL — ABNORMAL LOW (ref 3.5–5.0)
Albumin: 3.5 g/dL (ref 3.5–5.0)
Anion gap: 16 — ABNORMAL HIGH (ref 5–15)
Anion gap: 16 — ABNORMAL HIGH (ref 5–15)
BUN: 23 mg/dL — ABNORMAL HIGH (ref 6–20)
BUN: 26 mg/dL — ABNORMAL HIGH (ref 6–20)
CALCIUM: 8.9 mg/dL (ref 8.9–10.3)
CHLORIDE: 96 mmol/L — AB (ref 101–111)
CO2: 28 mmol/L (ref 22–32)
CO2: 26 mmol/L (ref 22–32)
CREATININE: 6.84 mg/dL — AB (ref 0.61–1.24)
Calcium: 9.5 mg/dL (ref 8.9–10.3)
Chloride: 95 mmol/L — ABNORMAL LOW (ref 101–111)
Creatinine, Ser: 7.83 mg/dL — ABNORMAL HIGH (ref 0.61–1.24)
GFR calc Af Amer: 8 mL/min — ABNORMAL LOW (ref >60)
GFR calc non Af Amer: 7 mL/min — ABNORMAL LOW (ref >60)
GFR, EST AFRICAN AMERICAN: 9 mL/min — AB (ref >60)
GFR, EST NON AFRICAN AMERICAN: 8 mL/min — AB (ref >60)
Glucose, Bld: 84 mg/dL (ref 65–99)
Glucose, Bld: 108 mg/dL — ABNORMAL HIGH (ref 65–99)
Phosphorus: 4.9 mg/dL — ABNORMAL HIGH (ref 2.5–4.6)
Phosphorus: 5.3 mg/dL — ABNORMAL HIGH (ref 2.5–4.6)
Potassium: 4.6 mmol/L (ref 3.5–5.1)
Potassium: 4.9 mmol/L (ref 3.5–5.1)
SODIUM: 140 mmol/L (ref 135–145)
Sodium: 137 mmol/L (ref 135–145)

## 2015-11-15 LAB — CBC
HCT: 35.2 % — ABNORMAL LOW (ref 39.0–52.0)
Hemoglobin: 11.9 g/dL — ABNORMAL LOW (ref 13.0–17.0)
MCH: 29.2 pg (ref 26.0–34.0)
MCHC: 33.8 g/dL (ref 30.0–36.0)
MCV: 86.5 fL (ref 78.0–100.0)
Platelets: 95 10*3/uL — ABNORMAL LOW (ref 150–400)
RBC: 4.07 MIL/uL — ABNORMAL LOW (ref 4.22–5.81)
RDW: 17.3 % — ABNORMAL HIGH (ref 11.5–15.5)
WBC: 2.4 10*3/uL — ABNORMAL LOW (ref 4.0–10.5)

## 2015-11-15 LAB — CBC WITH DIFFERENTIAL/PLATELET
BASOS ABS: 0 10*3/uL (ref 0.0–0.1)
BASOS PCT: 1 %
EOS PCT: 2 %
Eosinophils Absolute: 0.1 10*3/uL (ref 0.0–0.7)
HCT: 33.6 % — ABNORMAL LOW (ref 39.0–52.0)
Hemoglobin: 11.4 g/dL — ABNORMAL LOW (ref 13.0–17.0)
Lymphocytes Relative: 25 %
Lymphs Abs: 0.6 10*3/uL — ABNORMAL LOW (ref 0.7–4.0)
MCH: 29 pg (ref 26.0–34.0)
MCHC: 33.9 g/dL (ref 30.0–36.0)
MCV: 85.5 fL (ref 78.0–100.0)
MONO ABS: 0.5 10*3/uL (ref 0.1–1.0)
Monocytes Relative: 22 %
Neutro Abs: 1.1 10*3/uL — ABNORMAL LOW (ref 1.7–7.7)
Neutrophils Relative %: 51 %
PLATELETS: 88 10*3/uL — AB (ref 150–400)
RBC: 3.93 MIL/uL — ABNORMAL LOW (ref 4.22–5.81)
RDW: 17.1 % — AB (ref 11.5–15.5)
WBC: 2.2 10*3/uL — ABNORMAL LOW (ref 4.0–10.5)

## 2015-11-15 LAB — POCT I-STAT 3, ART BLOOD GAS (G3+)
ACID-BASE EXCESS: 3 mmol/L — AB (ref 0.0–2.0)
Acid-Base Excess: 6 mmol/L — ABNORMAL HIGH (ref 0.0–2.0)
BICARBONATE: 26.7 meq/L — AB (ref 20.0–24.0)
BICARBONATE: 31.5 meq/L — AB (ref 20.0–24.0)
O2 SAT: 50 %
O2 Saturation: 92 %
PCO2 ART: 47.8 mmHg — AB (ref 35.0–45.0)
PO2 ART: 58 mmHg — AB (ref 80.0–100.0)
TCO2: 28 mmol/L (ref 0–100)
TCO2: 33 mmol/L (ref 0–100)
pCO2 arterial: 35.8 mmHg (ref 35.0–45.0)
pH, Arterial: 7.427 (ref 7.350–7.450)
pH, Arterial: 7.48 — ABNORMAL HIGH (ref 7.350–7.450)
pO2, Arterial: 26 mmHg — CL (ref 80.0–100.0)

## 2015-11-15 LAB — GLUCOSE, CAPILLARY
GLUCOSE-CAPILLARY: 75 mg/dL (ref 65–99)
GLUCOSE-CAPILLARY: 69 mg/dL (ref 65–99)

## 2015-11-15 LAB — HEMOGLOBIN A1C
Hgb A1c MFr Bld: 5.4 % (ref 4.8–5.6)
Mean Plasma Glucose: 108 mg/dL

## 2015-11-15 LAB — PROTIME-INR
INR: 1.39 (ref 0.00–1.49)
PROTHROMBIN TIME: 17.2 s — AB (ref 11.6–15.2)

## 2015-11-15 SURGERY — RIGHT HEART CATH
Laterality: Right

## 2015-11-15 MED ORDER — LIDOCAINE HCL (PF) 1 % IJ SOLN
INTRAMUSCULAR | Status: AC
  Filled 2015-11-15: qty 30

## 2015-11-15 MED ORDER — SODIUM CHLORIDE 0.9% FLUSH: 3.0000 mL | INTRAVENOUS | Status: DC | PRN

## 2015-11-15 MED ORDER — HEPARIN (PORCINE) IN NACL 2-0.9 UNIT/ML-% IJ SOLN
INTRAMUSCULAR | Status: DC | PRN
  Administered 2015-11-15: 1000 mL

## 2015-11-15 MED ORDER — HEPARIN (PORCINE) IN NACL 2-0.9 UNIT/ML-% IJ SOLN
INTRAMUSCULAR | Status: AC
  Filled 2015-11-15: qty 1000

## 2015-11-15 MED ORDER — SODIUM CHLORIDE 0.9 % IV SOLN: 250.0000 mL | INTRAVENOUS | Status: DC | PRN

## 2015-11-15 MED ORDER — SORBITOL 70 % SOLN
30.0000 mL | Freq: Once | Status: AC
  Administered 2015-11-15: 30 mL via ORAL

## 2015-11-15 MED ORDER — FENTANYL CITRATE (PF) 100 MCG/2ML IJ SOLN
INTRAMUSCULAR | Status: AC
  Filled 2015-11-15: qty 2

## 2015-11-15 MED ORDER — IOHEXOL 350 MG/ML SOLN
INTRAVENOUS | Status: DC | PRN
  Administered 2015-11-15: 65 mL via INTRA_ARTERIAL

## 2015-11-15 MED ORDER — FENTANYL CITRATE (PF) 100 MCG/2ML IJ SOLN
INTRAMUSCULAR | Status: DC | PRN
  Administered 2015-11-15: 25 ug via INTRAVENOUS

## 2015-11-15 MED ORDER — MIDAZOLAM HCL 2 MG/2ML IJ SOLN
INTRAMUSCULAR | Status: AC
  Filled 2015-11-15: qty 2

## 2015-11-15 MED ORDER — SODIUM CHLORIDE 0.9% FLUSH: 3.0000 mL | Freq: Two times a day (BID) | INTRAVENOUS | Status: DC

## 2015-11-15 MED ORDER — ONDANSETRON HCL 4 MG/2ML IJ SOLN: 4.0000 mg | Freq: Four times a day (QID) | INTRAMUSCULAR | Status: DC | PRN

## 2015-11-15 MED ORDER — PENTAFLUOROPROP-TETRAFLUOROETH EX AERO: 1.0000 "application " | INHALATION_SPRAY | CUTANEOUS | Status: DC | PRN

## 2015-11-15 MED ORDER — SODIUM CHLORIDE 0.9 % IV SOLN: 100.0000 mL | INTRAVENOUS | Status: DC | PRN

## 2015-11-15 MED ORDER — HEPARIN SODIUM (PORCINE) 1000 UNIT/ML DIALYSIS
1000.0000 [IU] | INTRAMUSCULAR | Status: DC | PRN
  Filled 2015-11-15: qty 1

## 2015-11-15 MED ORDER — ALTEPLASE 2 MG IJ SOLR
2.0000 mg | Freq: Once | INTRAMUSCULAR | Status: DC | PRN
Start: 2015-11-15 — End: 2015-11-16
  Filled 2015-11-15: qty 2

## 2015-11-15 MED ORDER — MIDAZOLAM HCL 2 MG/2ML IJ SOLN
INTRAMUSCULAR | Status: DC | PRN
Start: 2015-11-15 — End: 2015-11-15
  Administered 2015-11-15: 2 mg via INTRAVENOUS

## 2015-11-15 MED ORDER — DEXTROSE 50 % IV SOLN
0.5000 | Freq: Once | INTRAVENOUS | Status: AC
  Administered 2015-11-15: 25 mL via INTRAVENOUS
  Filled 2015-11-15: qty 50

## 2015-11-15 MED ORDER — ACETAMINOPHEN 325 MG PO TABS: 650.0000 mg | ORAL_TABLET | ORAL | Status: DC | PRN

## 2015-11-15 MED ORDER — LIDOCAINE HCL (PF) 1 % IJ SOLN
INTRAMUSCULAR | Status: DC | PRN
  Administered 2015-11-15: 25 mL via INTRADERMAL

## 2015-11-15 MED ORDER — NITROGLYCERIN 1 MG/10 ML FOR IR/CATH LAB
INTRA_ARTERIAL | Status: AC
  Filled 2015-11-15: qty 10

## 2015-11-15 MED ORDER — SODIUM CHLORIDE 0.9% FLUSH
3.0000 mL | Freq: Two times a day (BID) | INTRAVENOUS | Status: DC
  Administered 2015-11-15 – 2015-11-16 (×2): 3 mL via INTRAVENOUS

## 2015-11-15 SURGICAL SUPPLY — 10 items
CATH INFINITI 5FR JL5 (CATHETERS) ×4 IMPLANT
CATH INFINITI 5FR MULTPACK ANG (CATHETERS) ×4 IMPLANT
CATH SWAN GANZ 7F STRAIGHT (CATHETERS) ×4 IMPLANT
KIT HEART LEFT (KITS) ×4 IMPLANT
PACK CARDIAC CATHETERIZATION (CUSTOM PROCEDURE TRAY) ×4 IMPLANT
SHEATH PINNACLE 5F 10CM (SHEATH) ×4 IMPLANT
SHEATH PINNACLE 7F 10CM (SHEATH) ×4 IMPLANT
TRANSDUCER W/STOPCOCK (MISCELLANEOUS) ×8 IMPLANT
WIRE EMERALD 3MM-J .025X260CM (WIRE) ×4 IMPLANT
WIRE EMERALD 3MM-J .035X150CM (WIRE) ×4 IMPLANT

## 2015-11-15 NOTE — Interval H&P Note (Signed)
Cath Lab Visit (complete for each Cath Lab visit)  Clinical Evaluation Leading to the Procedure:   ACS: No.  Non-ACS:    Anginal Classification: CCS III  Anti-ischemic medical therapy: Minimal Therapy (1 class of medications)  Non-Invasive Test Results: No non-invasive testing performed  Prior CABG: No previous CABG      History and Physical Interval Note:  11/15/2015 12:51 PM  Trevor [name]  has presented today for surgery, with the diagnosis of cp  The various methods of treatment have been discussed with the patient and family. After consideration of risks, benefits and other options for treatment, the patient has consented to  Procedure(s): Right Heart Cath (N/A) Left Heart Cath (Right) as a surgical intervention .  The patient's history has been reviewed, patient examined, no change in status, stable for surgery.  I have reviewed the patient's chart and labs.  Questions were answered to the patient's satisfaction.     Gwyndolyn Guilford A

## 2015-11-15 NOTE — Progress Notes (Signed)
PT Cancellation Note  Patient Details Name: Trevor [name] MRN: [id number] DOB: Feb 05, 1951   Cancelled Treatment:    Reason Eval/Treat Not Completed: Patient not medically ready.  Pt on bedrest post femoral sheath removal.  PT will continue to follow acutely and will attempt to see pt again tomorrow.    Encarnacion Chu PT, DPT  Pager: [phone] Phone: [phone] 11/15/2015, 3:24 PM

## 2015-11-15 NOTE — Progress Notes (Signed)
SUBJECTIVE:  No complaints  OBJECTIVE:   Vitals:   Filed Vitals:   11/14/15 1750 11/14/15 2031 11/15/15 0506 11/15/15 0739  BP: 110/77 104/72 115/76   Pulse: 59 80    Temp: 97.9 F (36.6 C) 98.1 F (36.7 C) 97.6 F (36.4 C)   TempSrc:  Oral Oral   Resp: 18 18 18    Weight:   208 lb 11.2 oz (94.666 kg)   SpO2: 95% 95% 95% 99%   I&O's:   Intake/Output Summary (Last 24 hours) at 11/15/15 1610 Last data filed at 11/14/15 1817  Gross per 24 hour  Intake    240 ml  Output   2500 ml  Net  -2260 ml   TELEMETRY: Reviewed telemetry pt in atrial fibrillation with RVR     PHYSICAL EXAM General: Well developed, well nourished, in no acute distress Head: Eyes PERRLA, No xanthomas.   Normal cephalic and atramatic  Lungs:   Clear bilaterally to auscultation and percussion. Heart:   Irregularly irregular S1 S2 Pulses are 2+ & equal. Abdomen: Bowel sounds are positive, abdomen soft and non-tender without masses Extremities:   No clubbing, cyanosis or edema.  DP +1 Neuro: Alert and oriented X 3. Psych:  Good affect, responds appropriately   LABS: Basic Metabolic Panel:  Recent Labs  96/04/54 0838 11/15/15 0401  NA 137 140  K 5.3* 4.6  CL 99* 96*  CO2 19* 28  GLUCOSE 67 84  BUN 51* 23*  CREATININE 10.71* 6.84*  CALCIUM 9.3 8.9  MG 2.6*  --   PHOS  --  4.9*   Liver Function Tests:  Recent Labs  11/13/15 1718 11/14/15 0838 11/15/15 0401  AST 31 29  --   ALT 14* 13*  --   ALKPHOS 155* 144*  --   BILITOT 1.6* 1.4*  --   PROT 7.7 7.1  --   ALBUMIN 3.3* 3.2* 3.1*    Recent Labs  11/14/15 0345  LIPASE 38   CBC:  Recent Labs  11/13/15 1704 11/14/15 0838 11/15/15 0401  WBC 2.7* 2.1* 2.2*  NEUTROABS 1.4*  --  1.1*  HGB 11.3* 11.0* 11.4*  HCT 32.4* 32.8* 33.6*  MCV 84.4 83.7 85.5  PLT 95* 95* 88*   Cardiac Enzymes:  Recent Labs  11/14/15 0345 11/14/15 0837 11/14/15 1806  TROPONINI 0.06* 0.06* 0.07*   BNP: Invalid input(s):  POCBNP D-Dimer: No results for input(s): DDIMER in the last 72 hours. Hemoglobin A1C:  Recent Labs  11/14/15 0345  HGBA1C 5.4   Fasting Lipid Panel:  Recent Labs  11/14/15 0345  CHOL 72  HDL 31*  LDLCALC 32  TRIG 47  CHOLHDL 2.3   Thyroid Function Tests: No results for input(s): TSH, T4TOTAL, T3FREE, THYROIDAB in the last 72 hours.  Invalid input(s): FREET3 Anemia Panel: No results for input(s): VITAMINB12, FOLATE, FERRITIN, TIBC, IRON, RETICCTPCT in the last 72 hours. Coag Panel:   Lab Results  Component Value Date   INR 1.39 11/15/2015   INR 1.47 11/14/2015   INR 1.30 09/05/2014    RADIOLOGY: Dg Chest 2 View  11/13/2015  CLINICAL DATA:  Chest pain and shortness of breath today, cough for 1 month, hypertension, CHF, gout, GERD, prior pulmonary embolism, coronary artery disease post MI, atrial fibrillation/flutter, end-stage renal disease on dialysis EXAM: CHEST  2 VIEW COMPARISON:  10/31/2015 FINDINGS: Enlargement of cardiac silhouette with pulmonary vascular congestion. Calcified tortuous aorta. Calcified RIGHT paratracheal lymph nodes again seen. Interstitial infiltrates in the perihilar regions likely  reflecting mild chronic CHF. No segmental consolidation, pleural effusion or pneumothorax. Osseous mineralization normal without acute bony abnormality. IMPRESSION: Enlargement of cardiac silhouette with pulmonary venous hypertension and mild chronic failure. No new abnormalities. Electronically Signed   By: Ulyses Southward M.D.   On: 11/13/2015 17:06   Dg Chest 2 View  10/31/2015  CLINICAL DATA:  Dialysis patient reports he is not been feeling well for over 1 month. Chest pain, pressure and abdominal bloating during dialysis. Initial encounter. EXAM: CHEST  2 VIEW COMPARISON:  Single view of the chest 10/26/2015, 09/07/2014 and CT chest 09/05/2014. FINDINGS: There is marked cardiomegaly. No pneumothorax or pleural effusion is identified. Pulmonary vascular congestion is seen but  there is no edema. Calcified mediastinal and hilar lymphadenopathy is compatible with old granulomatous disease or sarcoid and appears unchanged. IMPRESSION: Cardiomegaly without acute disease. Electronically Signed   By: Drusilla Kanner M.D.   On: 10/31/2015 16:10   Ct Angio Chest Pe W/cm &/or Wo Cm  11/14/2015  CLINICAL DATA:  Pleuritic chest pain. History of pulmonary embolus and DVT. EXAM: CT ANGIOGRAPHY CHEST WITH CONTRAST TECHNIQUE: Multidetector CT imaging of the chest was performed using the standard protocol during bolus administration of intravenous contrast. Multiplanar CT image reconstructions and MIPs were obtained to evaluate the vascular anatomy. CONTRAST:  80mL OMNIPAQUE IOHEXOL 350 MG/ML SOLN COMPARISON:  Chest radiograph 1 day prior.  Chest CT 09/05/2014 FINDINGS: Resolution of previous filling defect in right middle lobe pulmonary artery. No new filling defects or acute pulmonary embolus. Multi chamber cardiomegaly, with prominent right atrial dilatation and significant contrast refluxing into the hepatic veins and IVC. Small pericardial effusion. Thoracic aorta with scattered atherosclerosis. No aneurysm. Coronary artery calcifications are seen. Extensive calcified mediastinal and bilateral hilar adenopathy. Calcified granuloma in the right middle lobe. Heterogeneous lower lobe ground-glass opacities and bronchial thickening. A chronic triangular opacity in the posterior medial right upper lobe adjacent to right pleural thickening/minimal effusion, most consistent with chronic atelectasis. Evaluation of the upper abdomen demonstrates hepatosplenomegaly and small volume perihepatic ascites. Atrophic kidneys. There are no acute or suspicious osseous abnormalities. Increased bone mineral density. Review of the MIP images confirms the above findings. IMPRESSION: 1. No pulmonary embolus. 2. Prominent right heart distension with contrast refluxing into the hepatic veins and IVC consistent with right  heart failure. This appears chronic but advanced in degree. 3. Small pericardial effusion. 4. Multifocal calcified mediastinal adenopathy, sequela of granulomatous disease versus sarcoidosis. 5. Chronic hepatosplenomegaly. Small volume intra-abdominal ascites. Electronically Signed   By: Rubye Oaks M.D.   On: 11/14/2015 03:45   US Abdomen Complete  11/14/2015  CLINICAL DATA:  Abdominal distension. EXAM: ABDOMEN ULTRASOUND COMPLETE COMPARISON:  CT 05/02/2016. FINDINGS: Gallbladder: Physiologically distended. Diffuse wall thickening measuring up to 9 mm. No gallstones visualized. No intraluminal sludge. Ring down artifact noted about the fundus. No sonographic Murphy sign noted by sonographer. Small amount of pericholecystic ascites. Common bile duct: Diameter: 3-4 mm. Liver: Hypodense lesion on CT not well seen sonographically. Within normal limits in parenchymal echogenicity. Nodular hepatic contours. Normal directional flow in the main portal vein. IVC: Prominent in diameter. Pancreas: Not well visualized. Spleen: Prominent size measuring 13.2 x 6.4 x 6.2 cm, splenic volume 274 mL. Right Kidney: Length: 8.3 cm. Atrophic with thinning of the renal parenchyma and increased echogenicity, patient with history of chronic renal disease on dialysis. No mass or hydronephrosis visualized. Left Kidney: Length: 6.6 cm. Atrophic with thinning of the renal parenchyma and increased renal echogenicity. Probable cyst  arising from the lateral kidney measuring 1.9 cm. No solid mass or hydronephrosis visualized. Abdominal aorta: No aneurysm visualized. Proximal aorta measures 3 cm, mid and distal aorta obscured. Other findings: Small volume perihepatic ascites. IMPRESSION: 1. Nodular hepatic contours, with diffuse gallbladder wall thickening and mild splenomegaly. Findings consistent with chronic liver disease. Small volume perihepatic ascites. 2. Ring down artifact in the gallbladder wall, may reflect adenomyomatosis. 3.  Shrunken echogenic kidneys consistent chronic renal disease. Electronically Signed   By: Rubye Oaks M.D.   On: 11/14/2015 01:22   Dg Chest Port 1 View  10/26/2015  CLINICAL DATA:  Syncope. EXAM: PORTABLE CHEST 1 VIEW COMPARISON:  September 07, 2014. FINDINGS: Stable cardiomegaly. No pneumothorax or pleural effusion is noted. No acute pulmonary disease is noted. Stable mild bibasilar scarring. Bony thorax is unremarkable. IMPRESSION: No acute cardiopulmonary abnormality seen. Electronically Signed   By: Lupita Raider, M.D.   On: 10/26/2015 11:16    ASSESSMENT AND PLAN: \  Trevor [name] is a 66 y.o. male with a history of HTN, ESRD on HD, chronic A. Fib, prior history of DVT/PE, GERD, gout, combined systolic and diastolic heart failure, OSA, syncopy, hepatosplenomegaly, CAD status post remote MI in 1990, chronic abdominal pain who presented to [facility] ED 3/7 with complaint of chest pain/abdominal pain.  1. Chest pain: The patient's chest pain seems more of epigastric pain. EKG without acute abnormality. Troponin with flat trend (0.06 x 4). Likely due to elevated serum creatinine and volume overload. His main complaint is abdominal pain and he takes many pain medication. Patient has a pancytopenia with electrolytes abnormality. CTA negative for PE. Marland Kitchen His abdomen is tense and ? Whether this may be etiology of some of his epigastric discomfort.  He has a history of coronary artery calcifications on CT in the past with nonobstructive CAD by coronary CTA in 2015.  Given recent episodes of syncope and CP will do left heart cath at time of right heart cath as well to reassess coronary anatomy.  Cardiac catheterization was discussed with the patient fully. The patient understands that risks include but are not limited to stroke (1 in 1000), death (1 in 1000), kidney failure [usually temporary] (1 in 500), bleeding (1 in 200), allergic reaction [possibly serious] (1 in 200).  The patient  understands and is willing to proceed.     2. Chronic biventricular heart failure with acute right-sided heart failure :Abdominal ultrasound shows nodular hepatic contour with call for blood thickening and splenomegaly. Findings consistent with chronic liver disease. CTA negative for PE with evidence of right-sided heart failure. Pending lower extremity venous Doppler to rule out DVT. Echocardiogram shows improvement in left ventricular function to 30-35% (EF 25-30% by echo 09/04/14), diffuse hypokinesis, aortic root dilation to 39 mm, dilation of ascending aorta, moderately dilated right ventricle, moderately dilated right atrium, PA peak pressure  moderately increased to 55 mmHg. Trivial pericardial effusion noted. Plan for right heart cath today. Chest CT showed calcified lymphadenopathy ? Granulomatous disease vs. Sarcoid.  Need to consider Cardiac sarcoid in setting of adenopathy and DCM  3. Chronic A. Fib: Rate is controlled. He has refused Coumadin after GI bleed.  4. Intractable abdominal pain: He is scheduled to have a colonoscope in 12/04/15.  5: Syncope:  - First episode occurred in a setting of A. fib RVR 2014. - Seen in ED 2/17 for syncope episode at that time his blood pressure was low. - Murmur noted on exam. Echocardiogram done shows  aortic valve calcification without stenosis. No mitral valve stenosis or regurgitation.   [name], MD  11/15/2015  9:18 AM

## 2015-11-15 NOTE — Progress Notes (Signed)
[facility] PROGRESS NOTE  Trevor [name] JYN:[id number] DOB: 1951-01-21 DOA: 11/13/2015 PCP: [name], MD  Assessment/Plan: #1 chest pain/chronic biventricular heart failure with acute on chronic systolic right-sided heart failure. Questionable etiology. EKG with no acute abnormalities. Cardiac enzymes elevated at 0.06 however seemed to plateau and elevated cardiac enzymes likely secondary to chronic kidney disease or volume overload. CT angiogram negative for PE. Patient with mildly distended abdomen. Abdominal ultrasound with no acute abnormalities except splenomegaly. Patient stated has had a bowel movement. 2-D echo with EF of 30-35% with diffuse hypokinesis. Patient status post left and right heart cavity a catheterization with mid RCA lesion 20% stenosed and severe left ventricular systolic dysfunction with a EF of 15-20%. Significant elevation of right-sided heart pressures with moderate severe pulmonary hypertension with a PA pressure of 55/29 and a mean pressure of 42. Continue aspirin, PPI I twice a day. Patient on hemodialysis. Per cardiology.  #2 chronic atrial fibrillation with CHA2DS2VASC 3  Currently rate controlled. Patient has refused anticoagulation in the past secondary to prior history of GI bleed. Patient also does have a thrombocytopenia which is chronic. Continue aspirin. Per cardiology.  #3 mildly distended abdomen/chronic abdominal pain Patient states he's had bowel movements yesterday. Abdominal ultrasound with nodular hepatic contour and splenomegaly with findings consistent with chronic liver disease. The prior discharge summary CT abdomen and pelvis on 10/14/2015 with chronic granulomatous disease with adenopathy and splenomegaly and volume overload. Patient had a prior liver biopsy December 2015 which showed sinusoidal dilatation and scattered foci hepatitis and negative for cirrhosis. Patient has had a normal gastric emptying study in 2014. Colonoscopy in 2070  showed sigmoid tics. Acute hepatitis serologies have been negative. FOBT negative. GI recommended outpatient EGD and colonoscopy. Will give a dose of sorbitol. Continue PPI twice daily. Follow.  #4 syncope Patient with prior history of syncope in the setting of atrial fibrillation in 2014. Patient was seen in the ED on February 2017 for syncopal episode have at that time was noted to be hypotensive. 2-D echo without any valvular stenosis. Follow for now.  #5 elevated troponin Likely secondary to volume overload. See problem #1.  #6 hypertension Stable. Labetalol. Hydralazine as needed.  #7 end-stage renal disease on hemodialysis. Monday Wednesday Friday Per nephrology.  #8 pancytopenia Likely secondary to cirrhosis and end-stage renal disease. No bleeding. Currently stable. Follow.  #9 hepatic cirrhosis Patient was stable hepatic function. Follow.  #10 gastroesophageal reflux disease PPI has been increased to twice a day.  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 Discontinue secondary to spontaneous back hematoma. Patient has refused anticoagulation. Lower extremity dopplers negative.  #12 prophylaxis PPI for GI prophylaxis. SCDs for DVT prophylaxis.  Code Status: Full Family Communication: Updated patient and family at bedside. Disposition Plan: Per cardiology.   Consultants:  Nephrology: Dr. Arlean Hopping 11/14/2015  Cardiology: Dr. Armanda Magic 11/14/2015  Procedures:  CT angiogram chest 11/14/2015  Chest x-ray 11/13/2015  Abdominal ultrasound 11/14/2015  2-D echo 11/13/2015  Left and right Cardiac catheterization 11/15/2015  Lower extremity Dopplers 11/15/2015  Antibiotics:  None  HPI/Subjective: Patient in bed post cath. Denies any chest pain. No shortness of breath. Patient stated had a bowel movement yesterday.  Objective: Filed Vitals:   11/15/15 1505 11/15/15 1519  BP: 120/101 128/100  Pulse:    Temp:    Resp:      Intake/Output Summary  (Last 24 hours) at 11/15/15 1545 Last data filed at 11/14/15 1817  Gross per 24 hour  Intake  240 ml  Output   2500 ml  Net  -2260 ml   Filed Weights   11/14/15 1200 11/14/15 1615 11/15/15 0506  Weight: 97 kg (213 lb 13.5 oz) 95.7 kg (210 lb 15.7 oz) 94.666 kg (208 lb 11.2 oz)    Exam:   General:  NAD  Cardiovascular: RRR  Respiratory: Bibasilar crackles otherwise clear. No wheezing.  Abdomen: Slightly distended, positive bowel sounds, no rebound, no guarding, nontender to palpation.  Musculoskeletal: No clubbing cyanosis or edema.   Data Reviewed: Basic Metabolic Panel:  Recent Labs Lab 11/13/15 1718 11/14/15 0838 11/15/15 0401  NA 137 137 140  K 5.0 5.3* 4.6  CL 96* 99* 96*  CO2 25 19* 28  GLUCOSE 90 67 84  BUN 45* 51* 23*  CREATININE 9.60* 10.71* 6.84*  CALCIUM 9.6 9.3 8.9  MG  --  2.6*  --   PHOS  --   --  4.9*   Liver Function Tests:  Recent Labs Lab 11/13/15 1718 11/14/15 0838 11/15/15 0401  AST 31 29  --   ALT 14* 13*  --   ALKPHOS 155* 144*  --   BILITOT 1.6* 1.4*  --   PROT 7.7 7.1  --   ALBUMIN 3.3* 3.2* 3.1*    Recent Labs Lab 11/14/15 0345  LIPASE 38   No results for input(s): AMMONIA in the last 168 hours. CBC:  Recent Labs Lab 11/13/15 1704 11/14/15 0838 11/15/15 0401  WBC 2.7* 2.1* 2.2*  NEUTROABS 1.4*  --  1.1*  HGB 11.3* 11.0* 11.4*  HCT 32.4* 32.8* 33.6*  MCV 84.4 83.7 85.5  PLT 95* 95* 88*   Cardiac Enzymes:  Recent Labs Lab 11/13/15 2000 11/14/15 0125 11/14/15 0345 11/14/15 0837 11/14/15 1806  TROPONINI 0.06* 0.06* 0.06* 0.06* 0.07*   BNP (last 3 results) No results for input(s): BNP in the last 8760 hours.  ProBNP (last 3 results) No results for input(s): PROBNP in the last 8760 hours.  CBG:  Recent Labs Lab 11/14/15 1136 11/14/15 2302 11/15/15 0841 11/15/15 1155  GLUCAP 66 103* 75 69    Recent Results (from the past 240 hour(s))  MRSA PCR Screening     Status: None   Collection Time:  11/14/15  8:34 PM  Result Value Ref Range Status   MRSA by PCR NEGATIVE NEGATIVE Final    Comment:        The GeneXpert MRSA Assay (FDA approved for NASAL specimens only), is one component of a comprehensive MRSA colonization surveillance program. It is not intended to diagnose MRSA infection nor to guide or monitor treatment for MRSA infections.      Studies: Dg Chest 2 View  11/13/2015  CLINICAL DATA:  Chest pain and shortness of breath today, cough for 1 month, hypertension, CHF, gout, GERD, prior pulmonary embolism, coronary artery disease post MI, atrial fibrillation/flutter, end-stage renal disease on dialysis EXAM: CHEST  2 VIEW COMPARISON:  10/31/2015 FINDINGS: Enlargement of cardiac silhouette with pulmonary vascular congestion. Calcified tortuous aorta. Calcified RIGHT paratracheal lymph nodes again seen. Interstitial infiltrates in the perihilar regions likely reflecting mild chronic CHF. No segmental consolidation, pleural effusion or pneumothorax. Osseous mineralization normal without acute bony abnormality. IMPRESSION: Enlargement of cardiac silhouette with pulmonary venous hypertension and mild chronic failure. No new abnormalities. Electronically Signed   By: Ulyses Southward M.D.   On: 11/13/2015 17:06   Ct Angio Chest Pe W/cm &/or Wo Cm  11/14/2015  CLINICAL DATA:  Pleuritic chest pain. History of pulmonary embolus  and DVT. EXAM: CT ANGIOGRAPHY CHEST WITH CONTRAST TECHNIQUE: Multidetector CT imaging of the chest was performed using the standard protocol during bolus administration of intravenous contrast. Multiplanar CT image reconstructions and MIPs were obtained to evaluate the vascular anatomy. CONTRAST:  80mL OMNIPAQUE IOHEXOL 350 MG/ML SOLN COMPARISON:  Chest radiograph 1 day prior.  Chest CT 09/05/2014 FINDINGS: Resolution of previous filling defect in right middle lobe pulmonary artery. No new filling defects or acute pulmonary embolus. Multi chamber cardiomegaly, with  prominent right atrial dilatation and significant contrast refluxing into the hepatic veins and IVC. Small pericardial effusion. Thoracic aorta with scattered atherosclerosis. No aneurysm. Coronary artery calcifications are seen. Extensive calcified mediastinal and bilateral hilar adenopathy. Calcified granuloma in the right middle lobe. Heterogeneous lower lobe ground-glass opacities and bronchial thickening. A chronic triangular opacity in the posterior medial right upper lobe adjacent to right pleural thickening/minimal effusion, most consistent with chronic atelectasis. Evaluation of the upper abdomen demonstrates hepatosplenomegaly and small volume perihepatic ascites. Atrophic kidneys. There are no acute or suspicious osseous abnormalities. Increased bone mineral density. Review of the MIP images confirms the above findings. IMPRESSION: 1. No pulmonary embolus. 2. Prominent right heart distension with contrast refluxing into the hepatic veins and IVC consistent with right heart failure. This appears chronic but advanced in degree. 3. Small pericardial effusion. 4. Multifocal calcified mediastinal adenopathy, sequela of granulomatous disease versus sarcoidosis. 5. Chronic hepatosplenomegaly. Small volume intra-abdominal ascites. Electronically Signed   By: Rubye Oaks M.D.   On: 11/14/2015 03:45   US Abdomen Complete  11/14/2015  CLINICAL DATA:  Abdominal distension. EXAM: ABDOMEN ULTRASOUND COMPLETE COMPARISON:  CT 05/02/2016. FINDINGS: Gallbladder: Physiologically distended. Diffuse wall thickening measuring up to 9 mm. No gallstones visualized. No intraluminal sludge. Ring down artifact noted about the fundus. No sonographic Murphy sign noted by sonographer. Small amount of pericholecystic ascites. Common bile duct: Diameter: 3-4 mm. Liver: Hypodense lesion on CT not well seen sonographically. Within normal limits in parenchymal echogenicity. Nodular hepatic contours. Normal directional flow in the  main portal vein. IVC: Prominent in diameter. Pancreas: Not well visualized. Spleen: Prominent size measuring 13.2 x 6.4 x 6.2 cm, splenic volume 274 mL. Right Kidney: Length: 8.3 cm. Atrophic with thinning of the renal parenchyma and increased echogenicity, patient with history of chronic renal disease on dialysis. No mass or hydronephrosis visualized. Left Kidney: Length: 6.6 cm. Atrophic with thinning of the renal parenchyma and increased renal echogenicity. Probable cyst arising from the lateral kidney measuring 1.9 cm. No solid mass or hydronephrosis visualized. Abdominal aorta: No aneurysm visualized. Proximal aorta measures 3 cm, mid and distal aorta obscured. Other findings: Small volume perihepatic ascites. IMPRESSION: 1. Nodular hepatic contours, with diffuse gallbladder wall thickening and mild splenomegaly. Findings consistent with chronic liver disease. Small volume perihepatic ascites. 2. Ring down artifact in the gallbladder wall, may reflect adenomyomatosis. 3. Shrunken echogenic kidneys consistent chronic renal disease. Electronically Signed   By: Rubye Oaks M.D.   On: 11/14/2015 01:22    Scheduled Meds: . antiseptic oral rinse  7 mL Mouth Rinse BID  . aspirin EC  81 mg Oral Daily  . B-complex with vitamin C  1 tablet Oral Daily  . doxercalciferol  6 mcg Intravenous Q M,W,F-HD  . feeding supplement (NEPRO CARB STEADY)  237 mL Oral BID BM  . pantoprazole  40 mg Oral BID AC  . sevelamer carbonate  2,400 mg Oral TID WC  . sodium chloride flush  3 mL Intravenous Q12H  . sodium chloride  flush  3 mL Intravenous Q12H   Continuous Infusions:   Principal Problem:   Chest pain Active Problems:   Obstructive sleep apnea   Essential hypertension   Secondary renal hyperparathyroidism (HCC)   ESRD on hemodialysis (HCC)   GERD (gastroesophageal reflux disease)   DVT of lower extremity (deep venous thrombosis) RLE   Pulmonary embolism (HCC)   Ascites   Pancytopenia (HCC)    Abdominal pain, chronic, epigastric   Chronic atrial fibrillation (HCC)   Abdominal distension   Palliative care encounter   Bloating   Hepatic cirrhosis (HCC)   Hepatosplenomegaly   Chronic combined systolic and diastolic heart failure (HCC)   Acute on chronic systolic right heart failure (HCC)   Elevated troponin   NICM (nonischemic cardiomyopathy) (HCC)    Time spent: 35 mins    [facility] MD [facility] Pager [phone]. If 7PM-7AM, please contact night-coverage at [email or web address], password [facility] 11/15/2015, 3:45 PM  LOS: 1 day

## 2015-11-15 NOTE — Progress Notes (Signed)
Patient refused CBG. 

## 2015-11-15 NOTE — Progress Notes (Signed)
Hypoglycemic Event  CBG: 69  Treatment: 1/2 amp D50  Symptoms: asymptomatic   Follow-up CBG: Time:pt transferred to cath lab CBG Result:  Possible Reasons for Event: NPO all morning for cardiac cath  Comments/MD notified:    [facility]

## 2015-11-15 NOTE — Progress Notes (Signed)
Occupational Therapy Evaluation Patient Details Name: Trevor [name] MRN: [id number] DOB: [birth year] Today's Date: 11/15/2015    History of Present Illness 65 y.o. male with a history of HTN, ESRD on HD, chronic A. Fib, prior history of DVT/PE, GERD, gout, combined systolic and diastolic heart failure, OSA, syncopy, hepatosplenomegaly, CAD status post remote MI in 1990, chronic abdominal pain who presented to [facility] ED 3/7 with complaint of chest pain/abdominal pain.   Clinical Impression   Pt admitted with the above diagnoses and presents with below problem list. Pt will benefit from continued acute OT to address the below listed deficits and maximize independence with BADLs prior to d/c home with family. PTA pt was independent with ADLs and active. Pt is currently supervision level for most ADLs. Limited evaluation due to pt with low blood sugar and transport services impending arrival. Would like to follow up with pt at least one more time to further assess activity tolerance during ADLs. Plan to further educate on energy conservation strategies. OT to continue to follow acutely.     Follow Up Recommendations  Supervision - Intermittent;No OT follow up    Equipment Recommendations  None recommended by OT    Recommendations for Other Services PT consult     Precautions / Restrictions Restrictions Weight Bearing Restrictions: No      Mobility Bed Mobility               General bed mobility comments: pt sitting EOB  Transfers Overall transfer level: Needs assistance Equipment used: None Transfers: Sit to/from Stand Sit to Stand: Supervision         General transfer comment: from EOB    Balance Overall balance assessment: No apparent balance deficits (not formally assessed)                                          ADL Overall ADL's : Needs assistance/impaired Eating/Feeding: NPO Eating/Feeding Details (indicate cue type and  reason): currently NPO for procedure Grooming: Supervision/safety;Standing   Upper Body Bathing: Set up;Sitting   Lower Body Bathing: Supervison/ safety;Sit to/from stand   Upper Body Dressing : Set up;Sitting   Lower Body Dressing: Supervision/safety;Sit to/from stand   Toilet Transfer: Supervision/safety;Ambulation;Grab bars   Toileting- Clothing Manipulation and Hygiene: Set up;Sitting/lateral lean   Tub/ Shower Transfer: Tub transfer;Min guard;Ambulation   Functional mobility during ADLs: Supervision/safety General ADL Comments: Short evaluation due to pt with low blood sugar and transport services impending arrival. Pt completed in-room functional mobility. Reports he was very active PTA.      Vision     Perception     Praxis      Pertinent Vitals/Pain Pain Assessment: Faces Faces Pain Scale: Hurts little more Pain Location: abdomen Pain Intervention(s): Monitored during session     Hand Dominance  (ambidextrous)   Extremity/Trunk Assessment Upper Extremity Assessment Upper Extremity Assessment: Overall WFL for tasks assessed   Lower Extremity Assessment Lower Extremity Assessment: Defer to PT evaluation       Communication Communication Communication: No difficulties   Cognition Arousal/Alertness: Awake/alert Behavior During Therapy: WFL for tasks assessed/performed Overall Cognitive Status: Within Functional Limits for tasks assessed                     General Comments       Exercises       Shoulder Instructions  Home Living Family/patient expects to be discharged to:: Private residence Living Arrangements: Spouse/significant other;Children Available Help at Discharge: Family;Available 24 hours/day Type of Home: Apartment Home Access: Stairs to enter;Elevator     Home Layout: One level     Bathroom Shower/Tub: Chief Strategy Officer: Standard     Home Equipment: None   Additional Comments: retired,  exercises including yoga at [employer]      Prior Functioning/Environment Level of Independence: Independent             OT Diagnosis: Generalized weakness   OT Problem List: Decreased activity tolerance;Decreased knowledge of use of DME or AE;Decreased knowledge of precautions;Cardiopulmonary status limiting activity;Impaired balance (sitting and/or standing);Pain   OT Treatment/Interventions: Self-care/ADL training;Energy conservation;DME and/or AE instruction;Therapeutic activities;Patient/family education;Balance training    OT Goals(Current goals can be found in the care plan section) Acute Rehab OT Goals Patient Stated Goal: not stated OT Goal Formulation: With patient Time For Goal Achievement: 11/22/15 Potential to Achieve Goals: Good ADL Goals Pt Will Perform Grooming: standing (3 ADL tasks) Pt Will Perform Tub/Shower Transfer: Tub transfer;Independently;ambulating Additional ADL Goal #1: Pt will verbalize 2 energy conservation strategies to use during ADLs.  OT Frequency: Min 2X/week   Barriers to D/C:            Co-evaluation              End of Session    Activity Tolerance: Patient tolerated treatment well Patient left: in bed;with call bell/phone within reach   Time: 1140-1205 OT Time Calculation (min): 25 min Charges:  OT General Charges $OT Visit: 1 Procedure OT Evaluation $OT Eval Low Complexity: 1 Procedure G-Codes:    Pilar Grammes 2015-12-11, 12:24 PM

## 2015-11-15 NOTE — Progress Notes (Signed)
Pt refused all AM medications and AM CBG.  Pt educated on importance of talking medications and obtaining blood sugar, pt still refuses.  Will continue to monitor.

## 2015-11-15 NOTE — Progress Notes (Signed)
UR Completed Shannon Kirkendall Graves-Bigelow, RN,BSN [phone]  

## 2015-11-15 NOTE — Progress Notes (Signed)
PT Cancellation Note  Patient Details Name: Trevor [name] MRN: [id number] DOB: Aug 13, 1951   Cancelled Treatment:    Reason Eval/Treat Not Completed: Patient at procedure or test/unavailable.  Cath lab.  PT will continue to follow acutely.  Encarnacion Chu PT, DPT  Pager: [phone] Phone: [phone] 11/15/2015, 12:32 PM

## 2015-11-15 NOTE — Progress Notes (Signed)
Site area:RFA / RFV  Site Prior to Removal:  Level 0 Pressure Applied For:20 min Manual:  yes  Patient Status During Pull:  stable Post Pull Site:  Level 0 Post Pull Instructions Given:  yes Post Pull Pulses Present: palpable Dressing Applied: clear  Bedrest begins @ 1440 till 1840 Comments:

## 2015-11-15 NOTE — Progress Notes (Addendum)
*  PRELIMINARY RESULTS* Vascular Ultrasound Lower extremity venous duplex has been completed.  Preliminary findings: No evidence of DVT or baker's cyst. Pulsatile venous flow suggestive of increased right side heart pressure.   Farrel Demark, RDMS, RVT  11/15/2015, 9:21 AM

## 2015-11-15 NOTE — H&P (View-Only) (Signed)
SUBJECTIVE:  No complaints  OBJECTIVE:   Vitals:   Filed Vitals:   11/14/15 1750 11/14/15 2031 11/15/15 0506 11/15/15 0739  BP: 110/77 104/72 115/76   Pulse: 59 80    Temp: 97.9 F (36.6 C) 98.1 F (36.7 C) 97.6 F (36.4 C)   TempSrc:  Oral Oral   Resp: 18 18 18    Weight:   208 lb 11.2 oz (94.666 kg)   SpO2: 95% 95% 95% 99%   I&O's:   Intake/Output Summary (Last 24 hours) at 11/15/15 1610 Last data filed at 11/14/15 1817  Gross per 24 hour  Intake    240 ml  Output   2500 ml  Net  -2260 ml   TELEMETRY: Reviewed telemetry pt in atrial fibrillation with RVR     PHYSICAL EXAM General: Well developed, well nourished, in no acute distress Head: Eyes PERRLA, No xanthomas.   Normal cephalic and atramatic  Lungs:   Clear bilaterally to auscultation and percussion. Heart:   Irregularly irregular S1 S2 Pulses are 2+ & equal. Abdomen: Bowel sounds are positive, abdomen soft and non-tender without masses Extremities:   No clubbing, cyanosis or edema.  DP +1 Neuro: Alert and oriented X 3. Psych:  Good affect, responds appropriately   LABS: Basic Metabolic Panel:  Recent Labs  96/04/54 0838 11/15/15 0401  NA 137 140  K 5.3* 4.6  CL 99* 96*  CO2 19* 28  GLUCOSE 67 84  BUN 51* 23*  CREATININE 10.71* 6.84*  CALCIUM 9.3 8.9  MG 2.6*  --   PHOS  --  4.9*   Liver Function Tests:  Recent Labs  11/13/15 1718 11/14/15 0838 11/15/15 0401  AST 31 29  --   ALT 14* 13*  --   ALKPHOS 155* 144*  --   BILITOT 1.6* 1.4*  --   PROT 7.7 7.1  --   ALBUMIN 3.3* 3.2* 3.1*    Recent Labs  11/14/15 0345  LIPASE 38   CBC:  Recent Labs  11/13/15 1704 11/14/15 0838 11/15/15 0401  WBC 2.7* 2.1* 2.2*  NEUTROABS 1.4*  --  1.1*  HGB 11.3* 11.0* 11.4*  HCT 32.4* 32.8* 33.6*  MCV 84.4 83.7 85.5  PLT 95* 95* 88*   Cardiac Enzymes:  Recent Labs  11/14/15 0345 11/14/15 0837 11/14/15 1806  TROPONINI 0.06* 0.06* 0.07*   BNP: Invalid input(s):  POCBNP D-Dimer: No results for input(s): DDIMER in the last 72 hours. Hemoglobin A1C:  Recent Labs  11/14/15 0345  HGBA1C 5.4   Fasting Lipid Panel:  Recent Labs  11/14/15 0345  CHOL 72  HDL 31*  LDLCALC 32  TRIG 47  CHOLHDL 2.3   Thyroid Function Tests: No results for input(s): TSH, T4TOTAL, T3FREE, THYROIDAB in the last 72 hours.  Invalid input(s): FREET3 Anemia Panel: No results for input(s): VITAMINB12, FOLATE, FERRITIN, TIBC, IRON, RETICCTPCT in the last 72 hours. Coag Panel:   Lab Results  Component Value Date   INR 1.39 11/15/2015   INR 1.47 11/14/2015   INR 1.30 09/05/2014    RADIOLOGY: Dg Chest 2 View  11/13/2015  CLINICAL DATA:  Chest pain and shortness of breath today, cough for 1 month, hypertension, CHF, gout, GERD, prior pulmonary embolism, coronary artery disease post MI, atrial fibrillation/flutter, end-stage renal disease on dialysis EXAM: CHEST  2 VIEW COMPARISON:  10/31/2015 FINDINGS: Enlargement of cardiac silhouette with pulmonary vascular congestion. Calcified tortuous aorta. Calcified RIGHT paratracheal lymph nodes again seen. Interstitial infiltrates in the perihilar regions likely  reflecting mild chronic CHF. No segmental consolidation, pleural effusion or pneumothorax. Osseous mineralization normal without acute bony abnormality. IMPRESSION: Enlargement of cardiac silhouette with pulmonary venous hypertension and mild chronic failure. No new abnormalities. Electronically Signed   By: Ulyses Southward M.D.   On: 11/13/2015 17:06   Dg Chest 2 View  10/31/2015  CLINICAL DATA:  Dialysis patient reports he is not been feeling well for over 1 month. Chest pain, pressure and abdominal bloating during dialysis. Initial encounter. EXAM: CHEST  2 VIEW COMPARISON:  Single view of the chest 10/26/2015, 09/07/2014 and CT chest 09/05/2014. FINDINGS: There is marked cardiomegaly. No pneumothorax or pleural effusion is identified. Pulmonary vascular congestion is seen but  there is no edema. Calcified mediastinal and hilar lymphadenopathy is compatible with old granulomatous disease or sarcoid and appears unchanged. IMPRESSION: Cardiomegaly without acute disease. Electronically Signed   By: Drusilla Kanner M.D.   On: 10/31/2015 16:10   Ct Angio Chest Pe W/cm &/or Wo Cm  11/14/2015  CLINICAL DATA:  Pleuritic chest pain. History of pulmonary embolus and DVT. EXAM: CT ANGIOGRAPHY CHEST WITH CONTRAST TECHNIQUE: Multidetector CT imaging of the chest was performed using the standard protocol during bolus administration of intravenous contrast. Multiplanar CT image reconstructions and MIPs were obtained to evaluate the vascular anatomy. CONTRAST:  80mL OMNIPAQUE IOHEXOL 350 MG/ML SOLN COMPARISON:  Chest radiograph 1 day prior.  Chest CT 09/05/2014 FINDINGS: Resolution of previous filling defect in right middle lobe pulmonary artery. No new filling defects or acute pulmonary embolus. Multi chamber cardiomegaly, with prominent right atrial dilatation and significant contrast refluxing into the hepatic veins and IVC. Small pericardial effusion. Thoracic aorta with scattered atherosclerosis. No aneurysm. Coronary artery calcifications are seen. Extensive calcified mediastinal and bilateral hilar adenopathy. Calcified granuloma in the right middle lobe. Heterogeneous lower lobe ground-glass opacities and bronchial thickening. A chronic triangular opacity in the posterior medial right upper lobe adjacent to right pleural thickening/minimal effusion, most consistent with chronic atelectasis. Evaluation of the upper abdomen demonstrates hepatosplenomegaly and small volume perihepatic ascites. Atrophic kidneys. There are no acute or suspicious osseous abnormalities. Increased bone mineral density. Review of the MIP images confirms the above findings. IMPRESSION: 1. No pulmonary embolus. 2. Prominent right heart distension with contrast refluxing into the hepatic veins and IVC consistent with right  heart failure. This appears chronic but advanced in degree. 3. Small pericardial effusion. 4. Multifocal calcified mediastinal adenopathy, sequela of granulomatous disease versus sarcoidosis. 5. Chronic hepatosplenomegaly. Small volume intra-abdominal ascites. Electronically Signed   By: Rubye Oaks M.D.   On: 11/14/2015 03:45   US Abdomen Complete  11/14/2015  CLINICAL DATA:  Abdominal distension. EXAM: ABDOMEN ULTRASOUND COMPLETE COMPARISON:  CT 05/02/2016. FINDINGS: Gallbladder: Physiologically distended. Diffuse wall thickening measuring up to 9 mm. No gallstones visualized. No intraluminal sludge. Ring down artifact noted about the fundus. No sonographic Murphy sign noted by sonographer. Small amount of pericholecystic ascites. Common bile duct: Diameter: 3-4 mm. Liver: Hypodense lesion on CT not well seen sonographically. Within normal limits in parenchymal echogenicity. Nodular hepatic contours. Normal directional flow in the main portal vein. IVC: Prominent in diameter. Pancreas: Not well visualized. Spleen: Prominent size measuring 13.2 x 6.4 x 6.2 cm, splenic volume 274 mL. Right Kidney: Length: 8.3 cm. Atrophic with thinning of the renal parenchyma and increased echogenicity, patient with history of chronic renal disease on dialysis. No mass or hydronephrosis visualized. Left Kidney: Length: 6.6 cm. Atrophic with thinning of the renal parenchyma and increased renal echogenicity. Probable cyst  arising from the lateral kidney measuring 1.9 cm. No solid mass or hydronephrosis visualized. Abdominal aorta: No aneurysm visualized. Proximal aorta measures 3 cm, mid and distal aorta obscured. Other findings: Small volume perihepatic ascites. IMPRESSION: 1. Nodular hepatic contours, with diffuse gallbladder wall thickening and mild splenomegaly. Findings consistent with chronic liver disease. Small volume perihepatic ascites. 2. Ring down artifact in the gallbladder wall, may reflect adenomyomatosis. 3.  Shrunken echogenic kidneys consistent chronic renal disease. Electronically Signed   By: Rubye Oaks M.D.   On: 11/14/2015 01:22   Dg Chest Port 1 View  10/26/2015  CLINICAL DATA:  Syncope. EXAM: PORTABLE CHEST 1 VIEW COMPARISON:  September 07, 2014. FINDINGS: Stable cardiomegaly. No pneumothorax or pleural effusion is noted. No acute pulmonary disease is noted. Stable mild bibasilar scarring. Bony thorax is unremarkable. IMPRESSION: No acute cardiopulmonary abnormality seen. Electronically Signed   By: Lupita Raider, M.D.   On: 10/26/2015 11:16    ASSESSMENT AND PLAN: \  Trevor [name] is a 66 y.o. male with a history of HTN, ESRD on HD, chronic A. Fib, prior history of DVT/PE, GERD, gout, combined systolic and diastolic heart failure, OSA, syncopy, hepatosplenomegaly, CAD status post remote MI in 1990, chronic abdominal pain who presented to [facility] ED 3/7 with complaint of chest pain/abdominal pain.  1. Chest pain: The patient's chest pain seems more of epigastric pain. EKG without acute abnormality. Troponin with flat trend (0.06 x 4). Likely due to elevated serum creatinine and volume overload. His main complaint is abdominal pain and he takes many pain medication. Patient has a pancytopenia with electrolytes abnormality. CTA negative for PE. Marland Kitchen His abdomen is tense and ? Whether this may be etiology of some of his epigastric discomfort.  He has a history of coronary artery calcifications on CT in the past with nonobstructive CAD by coronary CTA in 2015.  Given recent episodes of syncope and CP will do left heart cath at time of right heart cath as well to reassess coronary anatomy.  Cardiac catheterization was discussed with the patient fully. The patient understands that risks include but are not limited to stroke (1 in 1000), death (1 in 1000), kidney failure [usually temporary] (1 in 500), bleeding (1 in 200), allergic reaction [possibly serious] (1 in 200).  The patient  understands and is willing to proceed.     2. Chronic biventricular heart failure with acute right-sided heart failure :Abdominal ultrasound shows nodular hepatic contour with call for blood thickening and splenomegaly. Findings consistent with chronic liver disease. CTA negative for PE with evidence of right-sided heart failure. Pending lower extremity venous Doppler to rule out DVT. Echocardiogram shows improvement in left ventricular function to 30-35% (EF 25-30% by echo 09/04/14), diffuse hypokinesis, aortic root dilation to 39 mm, dilation of ascending aorta, moderately dilated right ventricle, moderately dilated right atrium, PA peak pressure  moderately increased to 55 mmHg. Trivial pericardial effusion noted. Plan for right heart cath today. Chest CT showed calcified lymphadenopathy ? Granulomatous disease vs. Sarcoid.  Need to consider Cardiac sarcoid in setting of adenopathy and DCM  3. Chronic A. Fib: Rate is controlled. He has refused Coumadin after GI bleed.  4. Intractable abdominal pain: He is scheduled to have a colonoscope in 12/04/15.  5: Syncope:  - First episode occurred in a setting of A. fib RVR 2014. - Seen in ED 2/17 for syncope episode at that time his blood pressure was low. - Murmur noted on exam. Echocardiogram done shows  aortic valve calcification without stenosis. No mitral valve stenosis or regurgitation.   [name], MD  11/15/2015  9:18 AM

## 2015-11-15 NOTE — Progress Notes (Signed)
[facility] Progress Note  Dialysis: MWF [name] 4h F200 500/800 90.5kg (lowest post wt 93kg last 2 wks) 2/2 bath Hep none [name] 6 ug Venofer 1000 IV q HD thru 3/10   Assessment/Plan: 1 Syncope - no specific etiology 2 Chest pain - cards following 3 Chronic R / L heart failure - for right and left heart cath today 4 Volume- excess, is up 4 kg but usually doesn't get to dry wt. Net UF 2.5- post wt 95.7 - pt refused to have full goal removed yesterday - I suspect EDW needs to be raised, but still has volume to be removed.  BP lower here than outpt. Has severe R HF so may need to allow some degree of LE edema.  Heart cath pressures will be helpful.  5 ESRD on HD MWF 6 HTN cont labetalol - written here as 200 q d - outpt med list 200 bid - BP lower here than outpt unit - ? Taking as Rx in the outpt setting BP's have been too low here for antiHTN agents. Will hold labetalol.  Watch heart rate with hx afib.  7 MBD cont renvela/ hectorol  8 Chron afib - no coumadin d/t hx bleeding 9 Chron abd pain 10 Chron hepatic/ splenic enlargement; US showed GB wall thickening 11 Hx DVT / PE in the past 12. DM - BS lowish - going for heart cath - receiving D50 13. Luekopenia and thrombocytopenia- baseline WBC 2.5 - 3.3;; plts 80s - 100 baseline  Sheffield Slider, PA-C [facility] Beeper [phone] 11/15/2015,11:59 AM  LOS: 1 day   Pt seen, examined, agree w assess/plan as above with additions as indicated.  [name] MD [facility] pager [phone]    cell [phone] 11/15/2015, 1:31 PM     Subjective:   No complaints;  Objective Filed Vitals:   11/14/15 1750 11/14/15 2031 11/15/15 0506 11/15/15 0739  BP: 110/77 104/72 115/76   Pulse: 59 80    Temp: 97.9 F (36.6 C) 98.1 F (36.7 C) 97.6 F (36.4 C)   TempSrc:  Oral Oral   Resp: Weight:   94.666 kg (208 lb 11.2 oz)   SpO2: 95% 95% 95% 99%   Physical  Exam General: NAD sitting on side of bed Heart: irreg Lungs: crackles at bases Abdomen: soft Extremities: + LE edema Skin: very dry Dialysis Access: right upper AVF + bruit    Additional Objective Labs: Basic Metabolic Panel:  Recent Labs Lab 11/13/15 1718 11/14/15 0838 11/15/15 0401  NA 137 137 140  K 5.0 5.3* 4.6  CL 96* 99* 96*  CO2 25 19* 28  GLUCOSE 90 67 84  BUN 45* 51* 23*  CREATININE 9.60* 10.71* 6.84*  CALCIUM 9.6 9.3 8.9  PHOS  --   --  4.9*   Liver Function Tests:  Recent Labs Lab 11/13/15 1718 11/14/15 0838 11/15/15 0401  AST 31 29  --   ALT 14* 13*  --   ALKPHOS 155* 144*  --   BILITOT 1.6* 1.4*  --   PROT 7.7 7.1  --   ALBUMIN 3.3* 3.2* 3.1*    Recent Labs Lab 11/14/15 0345  LIPASE 38   CBC:  Recent Labs Lab 11/13/15 1704 11/14/15 0838 11/15/15 0401  WBC 2.7* 2.1* 2.2*  NEUTROABS 1.4*  --  1.1*  HGB 11.3* 11.0* 11.4*  HCT 32.4* 32.8* 33.6*  MCV 84.4 83.7 85.5  PLT 95* 95* 88*  Cardiac Enzymes:  Recent Labs Lab 11/13/15 2000 11/14/15 0125 11/14/15 0345 11/14/15 0837 11/14/15 1806  TROPONINI 0.06* 0.06* 0.06* 0.06* 0.07*   CBG:  Recent Labs Lab 11/14/15 1136 11/14/15 2302 11/15/15 0841 11/15/15 1155  GLUCAP 66 103* 75 69   I Studies/Results: Dg Chest 2 View  11/13/2015  CLINICAL DATA:  Chest pain and shortness of breath today, cough for 1 month, hypertension, CHF, gout, GERD, prior pulmonary embolism, coronary artery disease post MI, atrial fibrillation/flutter, end-stage renal disease on dialysis EXAM: CHEST  2 VIEW COMPARISON:  10/31/2015 FINDINGS: Enlargement of cardiac silhouette with pulmonary vascular congestion. Calcified tortuous aorta. Calcified RIGHT paratracheal lymph nodes again seen. Interstitial infiltrates in the perihilar regions likely reflecting mild chronic CHF. No segmental consolidation, pleural effusion or pneumothorax. Osseous mineralization normal without acute bony abnormality. IMPRESSION:  Enlargement of cardiac silhouette with pulmonary venous hypertension and mild chronic failure. No new abnormalities. Electronically Signed   By: Ulyses Southward M.D.   On: 11/13/2015 17:06   Ct Angio Chest Pe W/cm &/or Wo Cm  11/14/2015  CLINICAL DATA:  Pleuritic chest pain. History of pulmonary embolus and DVT. EXAM: CT ANGIOGRAPHY CHEST WITH CONTRAST TECHNIQUE: Multidetector CT imaging of the chest was performed using the standard protocol during bolus administration of intravenous contrast. Multiplanar CT image reconstructions and MIPs were obtained to evaluate the vascular anatomy. CONTRAST:  80mL OMNIPAQUE IOHEXOL 350 MG/ML SOLN COMPARISON:  Chest radiograph 1 day prior.  Chest CT 09/05/2014 FINDINGS: Resolution of previous filling defect in right middle lobe pulmonary artery. No new filling defects or acute pulmonary embolus. Multi chamber cardiomegaly, with prominent right atrial dilatation and significant contrast refluxing into the hepatic veins and IVC. Small pericardial effusion. Thoracic aorta with scattered atherosclerosis. No aneurysm. Coronary artery calcifications are seen. Extensive calcified mediastinal and bilateral hilar adenopathy. Calcified granuloma in the right middle lobe. Heterogeneous lower lobe ground-glass opacities and bronchial thickening. A chronic triangular opacity in the posterior medial right upper lobe adjacent to right pleural thickening/minimal effusion, most consistent with chronic atelectasis. Evaluation of the upper abdomen demonstrates hepatosplenomegaly and small volume perihepatic ascites. Atrophic kidneys. There are no acute or suspicious osseous abnormalities. Increased bone mineral density. Review of the MIP images confirms the above findings. IMPRESSION: 1. No pulmonary embolus. 2. Prominent right heart distension with contrast refluxing into the hepatic veins and IVC consistent with right heart failure. This appears chronic but advanced in degree. 3. Small pericardial  effusion. 4. Multifocal calcified mediastinal adenopathy, sequela of granulomatous disease versus sarcoidosis. 5. Chronic hepatosplenomegaly. Small volume intra-abdominal ascites. Electronically Signed   By: Rubye Oaks M.D.   On: 11/14/2015 03:45   US Abdomen Complete  11/14/2015  CLINICAL DATA:  Abdominal distension. EXAM: ABDOMEN ULTRASOUND COMPLETE COMPARISON:  CT 05/02/2016. FINDINGS: Gallbladder: Physiologically distended. Diffuse wall thickening measuring up to 9 mm. No gallstones visualized. No intraluminal sludge. Ring down artifact noted about the fundus. No sonographic Murphy sign noted by sonographer. Small amount of pericholecystic ascites. Common bile duct: Diameter: 3-4 mm. Liver: Hypodense lesion on CT not well seen sonographically. Within normal limits in parenchymal echogenicity. Nodular hepatic contours. Normal directional flow in the main portal vein. IVC: Prominent in diameter. Pancreas: Not well visualized. Spleen: Prominent size measuring 13.2 x 6.4 x 6.2 cm, splenic volume 274 mL. Right Kidney: Length: 8.3 cm. Atrophic with thinning of the renal parenchyma and increased echogenicity, patient with history of chronic renal disease on dialysis. No mass or hydronephrosis visualized. Left Kidney: Length: 6.6 cm. Atrophic  with thinning of the renal parenchyma and increased renal echogenicity. Probable cyst arising from the lateral kidney measuring 1.9 cm. No solid mass or hydronephrosis visualized. Abdominal aorta: No aneurysm visualized. Proximal aorta measures 3 cm, mid and distal aorta obscured. Other findings: Small volume perihepatic ascites. IMPRESSION: 1. Nodular hepatic contours, with diffuse gallbladder wall thickening and mild splenomegaly. Findings consistent with chronic liver disease. Small volume perihepatic ascites. 2. Ring down artifact in the gallbladder wall, may reflect adenomyomatosis. 3. Shrunken echogenic kidneys consistent chronic renal disease. Electronically Signed    By: Rubye Oaks M.D.   On: 11/14/2015 01:22   Medications: . sodium chloride     . antiseptic oral rinse  7 mL Mouth Rinse BID  . aspirin EC  81 mg Oral Daily  . B-complex with vitamin C  1 tablet Oral Daily  . doxercalciferol  6 mcg Intravenous Q M,W,F-HD  . feeding supplement (NEPRO CARB STEADY)  237 mL Oral BID BM  . labetalol  200 mg Oral Daily  . pantoprazole  40 mg Oral BID AC  . sevelamer carbonate  2,400 mg Oral TID WC  . sodium chloride flush  3 mL Intravenous Q12H

## 2015-11-16 DIAGNOSIS — I10 Essential (primary) hypertension: Secondary | ICD-10-CM

## 2015-11-16 LAB — CBC
HEMATOCRIT: 33.8 % — AB (ref 39.0–52.0)
Hemoglobin: 10.9 g/dL — ABNORMAL LOW (ref 13.0–17.0)
MCH: 27.6 pg (ref 26.0–34.0)
MCHC: 32.2 g/dL (ref 30.0–36.0)
MCV: 85.6 fL (ref 78.0–100.0)
Platelets: 85 10*3/uL — ABNORMAL LOW (ref 150–400)
RBC: 3.95 MIL/uL — ABNORMAL LOW (ref 4.22–5.81)
RDW: 17.1 % — AB (ref 11.5–15.5)
WBC: 2.3 10*3/uL — AB (ref 4.0–10.5)

## 2015-11-16 LAB — RENAL FUNCTION PANEL
ALBUMIN: 3.2 g/dL — AB (ref 3.5–5.0)
ANION GAP: 15 (ref 5–15)
BUN: 32 mg/dL — AB (ref 6–20)
CALCIUM: 9.3 mg/dL (ref 8.9–10.3)
CO2: 25 mmol/L (ref 22–32)
Chloride: 96 mmol/L — ABNORMAL LOW (ref 101–111)
Creatinine, Ser: 8.48 mg/dL — ABNORMAL HIGH (ref 0.61–1.24)
GFR calc Af Amer: 7 mL/min — ABNORMAL LOW (ref >60)
GFR, EST NON AFRICAN AMERICAN: 6 mL/min — AB (ref >60)
Glucose, Bld: 114 mg/dL — ABNORMAL HIGH (ref 65–99)
PHOSPHORUS: 5.4 mg/dL — AB (ref 2.5–4.6)
POTASSIUM: 4.6 mmol/L (ref 3.5–5.1)
SODIUM: 136 mmol/L (ref 135–145)

## 2015-11-16 LAB — GLUCOSE, CAPILLARY: GLUCOSE-CAPILLARY: 79 mg/dL (ref 65–99)

## 2015-11-16 MED ORDER — SACCHAROMYCES BOULARDII 250 MG PO CAPS
250.0000 mg | ORAL_CAPSULE | Freq: Two times a day (BID) | ORAL | Status: DC
  Administered 2015-11-16 (×2): 250 mg via ORAL
  Filled 2015-11-16 (×2): qty 1

## 2015-11-16 MED ORDER — DOXERCALCIFEROL 4 MCG/2ML IV SOLN
INTRAVENOUS | Status: AC
  Filled 2015-11-16: qty 2

## 2015-11-16 MED ORDER — CARVEDILOL 3.125 MG PO TABS
3.1250 mg | ORAL_TABLET | Freq: Two times a day (BID) | ORAL | Status: DC
  Administered 2015-11-16 – 2015-11-17 (×2): 3.125 mg via ORAL
  Filled 2015-11-16 (×2): qty 1

## 2015-11-16 MED ORDER — DOXERCALCIFEROL 4 MCG/2ML IV SOLN
INTRAVENOUS | Status: AC
  Administered 2015-11-16: 6 ug via INTRAVENOUS
  Filled 2015-11-16: qty 4

## 2015-11-16 MED ORDER — GI COCKTAIL ~~LOC~~
30.0000 mL | Freq: Three times a day (TID) | ORAL | Status: DC | PRN
  Administered 2015-11-16 – 2015-11-17 (×2): 30 mL via ORAL
  Filled 2015-11-16 (×2): qty 30

## 2015-11-16 NOTE — Progress Notes (Signed)
Patient arrived to unit per bed.  Reviewed treatment plan and this RN agrees.  Report received from bedside RN, Shanda Bumps.  Consent verified.  Patient A & O X 4. Lung sounds clear to ausculation in all fields. BLE edema. Cardiac: Regular R&R.  Prepped LUAVF with alcohol and cannulated with two 15 gauge needles.  Pulsation of blood noted.  Flushed access well with saline per protocol.  Connected and secured lines and initiated tx at 1316.  UF goal of 1900 mL and net fluid removal of 1400 mL.    At start of treatment patient requested clinic settings, SVS linear and UF Profile 4.  Nephrologist notified and telephone order received to make changes to plan.    Will continue to monitor.

## 2015-11-16 NOTE — Progress Notes (Signed)
Pt requested to have an alternative RN finish his dialysis treatment.  This RN reporting off.

## 2015-11-16 NOTE — Progress Notes (Addendum)
[facility] Progress Note  Dialysis: MWF [name] 4h F200 500/800 90.5kg (lowest post wt 93kg last 2 wks) 2/2 bath Hep none [name] 6 ug Venofer 1000 IV q HD thru 3/10   Assessment/Plan: 1 Syncope - vol depletion, resolved, raising dry wt 2 Chest pain - resolved 3 Chronic R / L heart failure - showed severe LV dysfunction, mod-severe pulm HTN and normal PCWP at 95kg 4 Volume- PCWP was 12 at cath yesterday which is normal at wt of 95kg.  No edema at 95kg. Patient working out and thinks he is gaining wt. Raising dry wt 95kg.  5 ESRD on HD MWF 6 HTN - labetalol dc'd, coreg started by cardiology for CHF. Watch bp's, decrease dose as prn 7 MBD cont renvela/ hectorol  8 Chron afib - no coumadin d/t hx bleeding 9 Chron abd pain 10 Chron hepatic/ splenic enlargement; US showed GB wall thickening 11 Hx DVT / PE in the past 12. DM - BS lowish - going for heart cath - receiving D50 13. Leukopenia and thrombocytopenia- baseline WBC 2.5 - 3.3;; plts 80s - 100 baseline 14. DIspo - stable for dc from our standpoint.   Plan - HD today, increasing dry wt as above.   [name] MD [facility] pager [phone]    cell [phone] 11/16/2015, 10:42 AM     Subjective:   No complaints;  Objective Filed Vitals:   11/15/15 1650 11/15/15 1750 11/15/15 1956 11/16/15 0428  BP: 109/82 105/85 123/89 128/82  Pulse:   94 87  Temp:   98.6 F (37 C) 98.9 F (37.2 C)  TempSrc:   Oral Oral  Resp:   16 19  Weight:    95.709 kg (211 lb)  SpO2:   96% 97%   Physical Exam General: NAD sitting on side of bed Heart: irreg Lungs: crackles at bases Abdomen: soft Extremities: + LE edema Skin: very dry Dialysis Access: right upper AVF + bruit    Additional Objective Labs: Basic Metabolic Panel:  Recent Labs Lab 11/15/15 0401 11/15/15 1548 11/16/15 0353  NA 140 137 136  K 4.6 4.9 4.6  CL 96* 95* 96*  CO2 28 26 25   GLUCOSE 84 108* 114*  BUN 23*  26* 32*  CREATININE 6.84* 7.83* 8.48*  CALCIUM 8.9 9.5 9.3  PHOS 4.9* 5.3* 5.4*   Liver Function Tests:  Recent Labs Lab 11/13/15 1718 11/14/15 0838 11/15/15 0401 11/15/15 1548 11/16/15 0353  AST 31 29  --   --   --   ALT 14* 13*  --   --   --   ALKPHOS 155* 144*  --   --   --   BILITOT 1.6* 1.4*  --   --   --   PROT 7.7 7.1  --   --   --   ALBUMIN 3.3* 3.2* 3.1* 3.5 3.2*    Recent Labs Lab 11/14/15 0345  LIPASE 38   CBC:  Recent Labs Lab 11/13/15 1704 11/14/15 0838 11/15/15 0401 11/15/15 1548 11/16/15 0353  WBC 2.7* 2.1* 2.2* 2.4* 2.3*  NEUTROABS 1.4*  --  1.1*  --   --   HGB 11.3* 11.0* 11.4* 11.9* 10.9*  HCT 32.4* 32.8* 33.6* 35.2* 33.8*  MCV 84.4 83.7 85.5 86.5 85.6  PLT 95* 95* 88* 95* 85*  Cardiac Enzymes:  Recent Labs Lab 11/13/15 2000 11/14/15 0125 11/14/15 0345 11/14/15 0837 11/14/15 1806  TROPONINI 0.06* 0.06* 0.06* 0.06* 0.07*   CBG:  Recent Labs  Lab 11/14/15 1136 11/14/15 2302 11/15/15 0841 11/15/15 1155 11/16/15 0746  GLUCAP 66 103* 75 69 79   I Studies/Results: No results found. Medications:   . antiseptic oral rinse  7 mL Mouth Rinse BID  . aspirin EC  81 mg Oral Daily  . B-complex with vitamin C  1 tablet Oral Daily  . carvedilol  3.125 mg Oral BID WC  . doxercalciferol  6 mcg Intravenous Q M,W,F-HD  . feeding supplement (NEPRO CARB STEADY)  237 mL Oral BID BM  . pantoprazole  40 mg Oral BID AC  . sevelamer carbonate  2,400 mg Oral TID WC  . sodium chloride flush  3 mL Intravenous Q12H

## 2015-11-16 NOTE — Progress Notes (Signed)
UF profile 4 turned off, UF turned off, sodium modeling discontinued, per patient request.

## 2015-11-16 NOTE — Progress Notes (Signed)
[facility] PROGRESS NOTE  CORDERA STINEMAN NWG:[id number] DOB: 05/20/1951 DOA: 11/13/2015 PCP: [name], MD  Assessment/Plan: #1 chest pain/chronic biventricular heart failure with acute on chronic systolic right-sided heart failure. Questionable etiology. EKG with no acute abnormalities. Cardiac enzymes elevated at 0.06 however seemed to plateau and elevated cardiac enzymes likely secondary to chronic kidney disease or volume overload. CT angiogram negative for PE. Patient with mildly distended abdomen. Abdominal ultrasound with no acute abnormalities except splenomegaly. Patient stated has had a bowel movement. 2-D echo with EF of 30-35% with diffuse hypokinesis. Patient status post left and right heart cavity a catheterization with mid RCA lesion 20% stenosed and severe left ventricular systolic dysfunction with a EF of 15-20%. Significant elevation of right-sided heart pressures with moderate severe pulmonary hypertension with a PA pressure of 55/29 and a mean pressure of 42. Continue aspirin, PPI  twice a day. Patient on hemodialysis. Per cardiology.  #2 chronic atrial fibrillation with CHA2DS2VASC 3  Currently rate controlled. Patient has refused anticoagulation in the past secondary to prior history of GI bleed. Patient also does have a thrombocytopenia which is chronic. Continue aspirin. Per cardiology.  #3 mildly distended abdomen/chronic abdominal pain Patient states he's had bowel movements yesterday after probiotics. Abdominal ultrasound with nodular hepatic contour and splenomegaly with findings consistent with chronic liver disease. The prior discharge summary CT abdomen and pelvis on 10/14/2015 with chronic granulomatous disease with adenopathy and splenomegaly and volume overload. Patient had a prior liver biopsy December 2015 which showed sinusoidal dilatation and scattered foci hepatitis and negative for cirrhosis. Patient has had a normal gastric emptying study in 2014.  Colonoscopy in 2070 showed sigmoid tics. Acute hepatitis serologies have been negative. FOBT negative. GI recommended outpatient EGD and colonoscopy. Continue PPI twice daily. Place on probiotics. Follow.  #4 syncope Patient with prior history of syncope in the setting of atrial fibrillation in 2014. Patient was seen in the ED on February 2017 for syncopal episode have at that time was noted to be hypotensive. 2-D echo without any valvular stenosis. Follow for now.  #5 elevated troponin Likely secondary to volume overload. See problem #1.  #6 hypertension Stable. Labetalol. Hydralazine as needed.  #7 end-stage renal disease on hemodialysis. Monday Wednesday Friday Per nephrology.  #8 pancytopenia Likely secondary to cirrhosis and end-stage renal disease. No bleeding. Currently stable. Follow.  #9 hepatic cirrhosis Patient was stable hepatic function. Follow.  #10 gastroesophageal reflux disease PPI has been increased to twice a day. GI cocktail when necessary.  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 Discontinue secondary to spontaneous back hematoma. Patient has refused anticoagulation. Lower extremity dopplers negative.  #12 prophylaxis PPI for GI prophylaxis. SCDs for DVT prophylaxis.  Code Status: Full Family Communication: Updated patient. No family at bedside. Disposition Plan: Per cardiology.   Consultants:  Nephrology: Dr. Arlean Hopping 11/14/2015  Cardiology: Dr. Armanda Magic 11/14/2015  Procedures:  CT angiogram chest 11/14/2015  Chest x-ray 11/13/2015  Abdominal ultrasound 11/14/2015  2-D echo 11/13/2015  Left and right Cardiac catheterization 11/15/2015  Lower extremity Dopplers 11/15/2015  Antibiotics:  None  HPI/Subjective: Patient states abdominal distension with some improvement after probiotics yesterday. No CP. No SOB. Patient c/o epigastric burning pain.  Objective: Filed Vitals:   11/15/15 1956 11/16/15 0428  BP: 123/89 128/82   Pulse: 94 87  Temp: 98.6 F (37 C) 98.9 F (37.2 C)  Resp: 16 19    Intake/Output Summary (Last 24 hours) at 11/16/15 1049 Last data filed at 11/15/15 2000  Gross per 24 hour  Intake    240 ml  Output      0 ml  Net    240 ml   Filed Weights   11/14/15 1615 11/15/15 0506 11/16/15 0428  Weight: 95.7 kg (210 lb 15.7 oz) 94.666 kg (208 lb 11.2 oz) 95.709 kg (211 lb)    Exam:   General:  NAD  Cardiovascular: RRR  Respiratory: CTAB  Abdomen: Slightly distended, positive bowel sounds, no rebound, no guarding, nontender to palpation.  Musculoskeletal: No clubbing cyanosis or edema.   Data Reviewed: Basic Metabolic Panel:  Recent Labs Lab 11/13/15 1718 11/14/15 0838 11/15/15 0401 11/15/15 1548 11/16/15 0353  NA 137 137 140 137 136  K 5.0 5.3* 4.6 4.9 4.6  CL 96* 99* 96* 95* 96*  CO2 25 19* 28 26 25   GLUCOSE 90 67 84 108* 114*  BUN 45* 51* 23* 26* 32*  CREATININE 9.60* 10.71* 6.84* 7.83* 8.48*  CALCIUM 9.6 9.3 8.9 9.5 9.3  MG  --  2.6*  --   --   --   PHOS  --   --  4.9* 5.3* 5.4*   Liver Function Tests:  Recent Labs Lab 11/13/15 1718 11/14/15 0838 11/15/15 0401 11/15/15 1548 11/16/15 0353  AST 31 29  --   --   --   ALT 14* 13*  --   --   --   ALKPHOS 155* 144*  --   --   --   BILITOT 1.6* 1.4*  --   --   --   PROT 7.7 7.1  --   --   --   ALBUMIN 3.3* 3.2* 3.1* 3.5 3.2*    Recent Labs Lab 11/14/15 0345  LIPASE 38   No results for input(s): AMMONIA in the last 168 hours. CBC:  Recent Labs Lab 11/13/15 1704 11/14/15 0838 11/15/15 0401 11/15/15 1548 11/16/15 0353  WBC 2.7* 2.1* 2.2* 2.4* 2.3*  NEUTROABS 1.4*  --  1.1*  --   --   HGB 11.3* 11.0* 11.4* 11.9* 10.9*  HCT 32.4* 32.8* 33.6* 35.2* 33.8*  MCV 84.4 83.7 85.5 86.5 85.6  PLT 95* 95* 88* 95* 85*   Cardiac Enzymes:  Recent Labs Lab 11/13/15 2000 11/14/15 0125 11/14/15 0345 11/14/15 0837 11/14/15 1806  TROPONINI 0.06* 0.06* 0.06* 0.06* 0.07*   BNP (last 3 results) No  results for input(s): BNP in the last 8760 hours.  ProBNP (last 3 results) No results for input(s): PROBNP in the last 8760 hours.  CBG:  Recent Labs Lab 11/14/15 1136 11/14/15 2302 11/15/15 0841 11/15/15 1155 11/16/15 0746  GLUCAP 66 103* 75 69 79    Recent Results (from the past 240 hour(s))  MRSA PCR Screening     Status: None   Collection Time: 11/14/15  8:34 PM  Result Value Ref Range Status   MRSA by PCR NEGATIVE NEGATIVE Final    Comment:        The GeneXpert MRSA Assay (FDA approved for NASAL specimens only), is one component of a comprehensive MRSA colonization surveillance program. It is not intended to diagnose MRSA infection nor to guide or monitor treatment for MRSA infections.      Studies: No results found.  Scheduled Meds: . antiseptic oral rinse  7 mL Mouth Rinse BID  . aspirin EC  81 mg Oral Daily  . B-complex with vitamin C  1 tablet Oral Daily  . carvedilol  3.125 mg Oral BID WC  . doxercalciferol  6 mcg  Intravenous Q M,W,F-HD  . feeding supplement (NEPRO CARB STEADY)  237 mL Oral BID BM  . pantoprazole  40 mg Oral BID AC  . sevelamer carbonate  2,400 mg Oral TID WC  . sodium chloride flush  3 mL Intravenous Q12H   Continuous Infusions:   Principal Problem:   Chest pain Active Problems:   Obstructive sleep apnea   Essential hypertension   Secondary renal hyperparathyroidism (HCC)   ESRD on hemodialysis (HCC)   GERD (gastroesophageal reflux disease)   DVT of lower extremity (deep venous thrombosis) RLE   Pulmonary embolism (HCC)   Ascites   Pancytopenia (HCC)   Abdominal pain, chronic, epigastric   Chronic atrial fibrillation (HCC)   Abdominal distension   Palliative care encounter   Bloating   Hepatic cirrhosis (HCC)   Hepatosplenomegaly   Chronic combined systolic and diastolic heart failure (HCC)   Acute on chronic systolic right heart failure (HCC)   Elevated troponin   NICM (nonischemic cardiomyopathy) (HCC)   Syncope  and collapse    Time spent: 35 mins    [facility] MD [facility] Pager [phone]. If 7PM-7AM, please contact night-coverage at [email or web address], password [facility] 11/16/2015, 10:49 AM  LOS: 2 days

## 2015-11-16 NOTE — Care Management Important Message (Signed)
Important Message  Patient Details  Name: Trevor [name] MRN: [id number] Date of Birth: 18-Sep-1950   Medicare Important Message Given:  Yes    [name] 11/16/2015, 11:41 AM

## 2015-11-16 NOTE — Progress Notes (Signed)
Pt attempting to change settings on dialysis machine.  This RN explained to him that he may not change settings on machine.  Pt stated that this RN does not know what he is doing, insisting that he can touch the machine.  This RN again attempted to explain to him what the RN was doing to change treatment settings per the patients request.  Pt became angry and stated "This conversation is over."  Will continue to monitor.

## 2015-11-16 NOTE — Progress Notes (Addendum)
Patient Name: Trevor [name] Date of Encounter: 11/16/2015   SUBJECTIVE  Complains of epigastric burning pain. No SOB.   CURRENT MEDS . antiseptic oral rinse  7 mL Mouth Rinse BID  . aspirin EC  81 mg Oral Daily  . B-complex with vitamin C  1 tablet Oral Daily  . doxercalciferol  6 mcg Intravenous Q M,W,F-HD  . feeding supplement (NEPRO CARB STEADY)  237 mL Oral BID BM  . pantoprazole  40 mg Oral BID AC  . sevelamer carbonate  2,400 mg Oral TID WC  . sodium chloride flush  3 mL Intravenous Q12H  . sodium chloride flush  3 mL Intravenous Q12H    OBJECTIVE  Filed Vitals:   11/15/15 1650 11/15/15 1750 11/15/15 1956 11/16/15 0428  BP: 109/82 105/85 123/89 128/82  Pulse:   94 87  Temp:   98.6 F (37 C) 98.9 F (37.2 C)  TempSrc:   Oral Oral  Resp:   16 19  Weight:    211 lb (95.709 kg)  SpO2:   96% 97%    Intake/Output Summary (Last 24 hours) at 11/16/15 0756 Last data filed at 11/15/15 2000  Gross per 24 hour  Intake    240 ml  Output      0 ml  Net    240 ml   Filed Weights   11/14/15 1615 11/15/15 0506 11/16/15 0428  Weight: 210 lb 15.7 oz (95.7 kg) 208 lb 11.2 oz (94.666 kg) 211 lb (95.709 kg)    PHYSICAL EXAM  General: Pleasant, NAD. Neuro: Alert and oriented X 3. Moves all extremities spontaneously. Psych: Normal affect. HEENT:  Normal  Neck: Supple without bruits or JVD. Lungs:  Resp regular and unlabored, CTA. Heart: IR IR no s3, s4, or murmurs. Abdomen: tight, diffuse tender,  BS + x 4.  Extremities: No clubbing, cyanosis or edema. DP/PT/Radials 2+ and equal bilaterally.  R groin cath site without hematoma.   Accessory Clinical Findings  CBC  Recent Labs  11/13/15 1704  11/15/15 0401 11/15/15 1548 11/16/15 0353  WBC 2.7*  < > 2.2* 2.4* 2.3*  NEUTROABS 1.4*  --  1.1*  --   --   HGB 11.3*  < > 11.4* 11.9* 10.9*  HCT 32.4*  < > 33.6* 35.2* 33.8*  MCV 84.4  < > 85.5 86.5 85.6  PLT 95*  < > 88* 95* 85*  < > = values in this interval not  displayed. Basic Metabolic Panel  Recent Labs  11/14/15 0838  11/15/15 1548 11/16/15 0353  NA 137  < > 137 136  K 5.3*  < > 4.9 4.6  CL 99*  < > 95* 96*  CO2 19*  < > 26 25  GLUCOSE 67  < > 108* 114*  BUN 51*  < > 26* 32*  CREATININE 10.71*  < > 7.83* 8.48*  CALCIUM 9.3  < > 9.5 9.3  MG 2.6*  --   --   --   PHOS  --   < > 5.3* 5.4*  < > = values in this interval not displayed. Liver Function Tests  Recent Labs  11/13/15 1718 11/14/15 0838  11/15/15 1548 11/16/15 0353  AST 31 29  --   --   --   ALT 14* 13*  --   --   --   ALKPHOS 155* 144*  --   --   --   BILITOT 1.6* 1.4*  --   --   --  PROT 7.7 7.1  --   --   --   ALBUMIN 3.3* 3.2*  < > 3.5 3.2*  < > = values in this interval not displayed.  Recent Labs  11/14/15 0345  LIPASE 38   Cardiac Enzymes  Recent Labs  11/14/15 0345 11/14/15 0837 11/14/15 1806  TROPONINI 0.06* 0.06* 0.07*   BNP Invalid input(s): POCBNP D-Dimer No results for input(s): DDIMER in the last 72 hours. Hemoglobin A1C  Recent Labs  11/14/15 0345  HGBA1C 5.4   Fasting Lipid Panel  Recent Labs  11/14/15 0345  CHOL 72  HDL 31*  LDLCALC 32  TRIG 47  CHOLHDL 2.3    TELE  afib at rate of 80s  Radiology/Studies  Dg Chest 2 View  11/13/2015  CLINICAL DATA:  Chest pain and shortness of breath today, cough for 1 month, hypertension, CHF, gout, GERD, prior pulmonary embolism, coronary artery disease post MI, atrial fibrillation/flutter, end-stage renal disease on dialysis EXAM: CHEST  2 VIEW COMPARISON:  10/31/2015 FINDINGS: Enlargement of cardiac silhouette with pulmonary vascular congestion. Calcified tortuous aorta. Calcified RIGHT paratracheal lymph nodes again seen. Interstitial infiltrates in the perihilar regions likely reflecting mild chronic CHF. No segmental consolidation, pleural effusion or pneumothorax. Osseous mineralization normal without acute bony abnormality. IMPRESSION: Enlargement of cardiac silhouette with  pulmonary venous hypertension and mild chronic failure. No new abnormalities. Electronically Signed   By: Ulyses Southward M.D.   On: 11/13/2015 17:06   Dg Chest 2 View  10/31/2015  CLINICAL DATA:  Dialysis patient reports he is not been feeling well for over 1 month. Chest pain, pressure and abdominal bloating during dialysis. Initial encounter. EXAM: CHEST  2 VIEW COMPARISON:  Single view of the chest 10/26/2015, 09/07/2014 and CT chest 09/05/2014. FINDINGS: There is marked cardiomegaly. No pneumothorax or pleural effusion is identified. Pulmonary vascular congestion is seen but there is no edema. Calcified mediastinal and hilar lymphadenopathy is compatible with old granulomatous disease or sarcoid and appears unchanged. IMPRESSION: Cardiomegaly without acute disease. Electronically Signed   By: Drusilla Kanner M.D.   On: 10/31/2015 16:10   Ct Angio Chest Pe W/cm &/or Wo Cm  11/14/2015  CLINICAL DATA:  Pleuritic chest pain. History of pulmonary embolus and DVT. EXAM: CT ANGIOGRAPHY CHEST WITH CONTRAST TECHNIQUE: Multidetector CT imaging of the chest was performed using the standard protocol during bolus administration of intravenous contrast. Multiplanar CT image reconstructions and MIPs were obtained to evaluate the vascular anatomy. CONTRAST:  67mL OMNIPAQUE IOHEXOL 350 MG/ML SOLN COMPARISON:  Chest radiograph 1 day prior.  Chest CT 09/05/2014 FINDINGS: Resolution of previous filling defect in right middle lobe pulmonary artery. No new filling defects or acute pulmonary embolus. Multi chamber cardiomegaly, with prominent right atrial dilatation and significant contrast refluxing into the hepatic veins and IVC. Small pericardial effusion. Thoracic aorta with scattered atherosclerosis. No aneurysm. Coronary artery calcifications are seen. Extensive calcified mediastinal and bilateral hilar adenopathy. Calcified granuloma in the right middle lobe. Heterogeneous lower lobe ground-glass opacities and bronchial  thickening. A chronic triangular opacity in the posterior medial right upper lobe adjacent to right pleural thickening/minimal effusion, most consistent with chronic atelectasis. Evaluation of the upper abdomen demonstrates hepatosplenomegaly and small volume perihepatic ascites. Atrophic kidneys. There are no acute or suspicious osseous abnormalities. Increased bone mineral density. Review of the MIP images confirms the above findings. IMPRESSION: 1. No pulmonary embolus. 2. Prominent right heart distension with contrast refluxing into the hepatic veins and IVC consistent with right heart failure. This appears  chronic but advanced in degree. 3. Small pericardial effusion. 4. Multifocal calcified mediastinal adenopathy, sequela of granulomatous disease versus sarcoidosis. 5. Chronic hepatosplenomegaly. Small volume intra-abdominal ascites. Electronically Signed   By: Rubye Oaks M.D.   On: 11/14/2015 03:45   US Abdomen Complete  11/14/2015  CLINICAL DATA:  Abdominal distension. EXAM: ABDOMEN ULTRASOUND COMPLETE COMPARISON:  CT 05/02/2016. FINDINGS: Gallbladder: Physiologically distended. Diffuse wall thickening measuring up to 9 mm. No gallstones visualized. No intraluminal sludge. Ring down artifact noted about the fundus. No sonographic Murphy sign noted by sonographer. Small amount of pericholecystic ascites. Common bile duct: Diameter: 3-4 mm. Liver: Hypodense lesion on CT not well seen sonographically. Within normal limits in parenchymal echogenicity. Nodular hepatic contours. Normal directional flow in the main portal vein. IVC: Prominent in diameter. Pancreas: Not well visualized. Spleen: Prominent size measuring 13.2 x 6.4 x 6.2 cm, splenic volume 274 mL. Right Kidney: Length: 8.3 cm. Atrophic with thinning of the renal parenchyma and increased echogenicity, patient with history of chronic renal disease on dialysis. No mass or hydronephrosis visualized. Left Kidney: Length: 6.6 cm. Atrophic with  thinning of the renal parenchyma and increased renal echogenicity. Probable cyst arising from the lateral kidney measuring 1.9 cm. No solid mass or hydronephrosis visualized. Abdominal aorta: No aneurysm visualized. Proximal aorta measures 3 cm, mid and distal aorta obscured. Other findings: Small volume perihepatic ascites. IMPRESSION: 1. Nodular hepatic contours, with diffuse gallbladder wall thickening and mild splenomegaly. Findings consistent with chronic liver disease. Small volume perihepatic ascites. 2. Ring down artifact in the gallbladder wall, may reflect adenomyomatosis. 3. Shrunken echogenic kidneys consistent chronic renal disease. Electronically Signed   By: Rubye Oaks M.D.   On: 11/14/2015 01:22   Dg Chest Port 1 View  10/26/2015  CLINICAL DATA:  Syncope. EXAM: PORTABLE CHEST 1 VIEW COMPARISON:  September 07, 2014. FINDINGS: Stable cardiomegaly. No pneumothorax or pleural effusion is noted. No acute pulmonary disease is noted. Stable mild bibasilar scarring. Bony thorax is unremarkable. IMPRESSION: No acute cardiopulmonary abnormality seen. Electronically Signed   By: Lupita Raider, M.D.   On: 10/26/2015 11:16   Echo 11/13/15 LV EF: 30% - 35%  ------------------------------------------------------------------- Indications: (R01.1).  ------------------------------------------------------------------- History: PMH: ESRD. Acquired from the patient and from the patient&'s chart. Syncope and murmur. Coronary artery disease. Congestive heart failure. PMH: Myocardial infarction. Risk factors: Hypertension.  ------------------------------------------------------------------- Study Conclusions  - Left ventricle: The cavity size was normal. There was mild focal  basal hypertrophy of the septum. Systolic function was moderately  to severely reduced. The estimated ejection fraction was in the  range of 30% to 35%. Diffuse hypokinesis. - Ventricular septum: The  contour showed diastolic flattening. - Aortic valve: Valve mobility was restricted. There was trivial  regurgitation. - Aorta: Aortic root dimension: 39 mm (ED). - Ascending aorta: The ascending aorta was mildly dilated. - Left atrium: The atrium was moderately dilated. - Right ventricle: The cavity size was moderately dilated. Wall  thickness was normal. - Right atrium: The atrium was moderately dilated. - Tricuspid valve: There was severe regurgitation. - Pulmonary arteries: Systolic pressure was moderately increased.  PA peak pressure: 55 mm Hg (S). - Pericardium, extracardiac: A trivial pericardial effusion was  identified posterior to the heart.  Left Heart Cath   11/15/15   Right Heart Cath    Conclusion     Mid RCA lesion, 20% stenosed.  There is severe left ventricular systolic dysfunction.  Significant elevation of right sided heart pressures with moderately severe pulmonary hypertension  with a PA pressure of 55/29 and a mean pressure 42.  Normal LVEDP at 11-12 mm with a PCWP pressure mean of 12.  No significant coronary obstructive disease with smooth 20% narrowing in the dominant RCA and normal left coronary circulation.  Severe left ventricular dysfunction with global hypokinesis and an ejection fraction of 15 to less than 20%.      ASSESSMENT AND PLAN   Trevor [name] is a 65 y.o. male with a history of HTN, ESRD on HD, chronic A. Fib, prior history of DVT/PE, GERD, gout, combined systolic and diastolic heart failure, OSA, syncopy, hepatosplenomegaly, CAD status post remote MI in 1990, chronic abdominal pain who presented to [facility] ED 3/7 with complaint of chest pain/abdominal pain.  1. Chest pain: The patient's chest pain seems more of epigastric pain. EKG without acute abnormality. Troponin with flat trend. Likely due to elevated serum creatinine and volume overload. His main complaint is abdominal pain and he takes many pain medication.  Patient has a pancytopenia with electrolytes abnormality. CTA negative for PE. .  - Cath showed No significant coronary obstructive disease with smooth 20% narrowing in the dominant RCA and normal left coronary circulation.  2. Chronic biventricular heart failure with acute right-sided heart failure : Abdominal ultrasound shows nodular hepatic contour with call for blood thickening and splenomegaly. Findings consistent with chronic liver disease. CTA negative for PE with evidence of right-sided heart failure.   Echocardiogram shows improvement in left ventricular function to 30-35% (EF 25-30% by echo 09/04/14), diffuse hypokinesis, aortic root dilation to 39 mm, dilation of ascending aorta, moderately dilated right ventricle, moderately dilated right atrium, PA peak pressure moderately increased to 55 mmHg. Trivial pericardial effusion noted. Chest CT showed calcified lymphadenopathy Granulomatous disease vs. Sarcoid. - Cath showed nonobstructive coronaries with significant elevation of right sided heart pressures with moderately severe pulmonary hypertension with a PA pressure of 55/29 and a mean pressure 42. Severe left ventricular dysfunction with global hypokinesis and an ejection fraction of 15 to less than 20%. - The patient is on HD for volume management. Will discussed with MD for medication management vs CHF clinic referral.   3. Chronic A. Fib: Rate is controlled. He has refused Coumadin after GI bleed.  4. Intractable abdominal pain: He is scheduled to have a colonoscope in 12/04/15. Complains of epigastric burning pain. GI eval sooner?  5: Syncope:  - First episode occurred in a setting of A. fib RVR 2014. - Seen in ED 2/17 for syncope episode at that time his blood pressure was low. - Murmur noted on exam. Echocardiogram done shows aortic valve calcification without stenosis. No mitral valve stenosis or regurgitation.  Lorelei Pont PA-C Pager [phone]  Patient seen  and examined with Harl Bowie, PA. We discussed all aspects of the encounter. I agree with the assessment and plan as stated above.  Cath revealed no  Significant CAD.  Severe LV failure with EF 15-20% by cath but PCW normal at 12 and LVEDP normal at 11-12.  Moderate pulmonary HTN with PAP 55/6mmHg. Chest CT showed calcified lymphadenopathy Granulomatous disease vs. Sarcoid so ? Whether he could have cardiac sarcoid as etiology of DCM.   Will start Coreg 3.125mg  daily and titrate as BP tolerates.  His BP tends to run low at HD which has limited use of HF drugs in the past.  Avoid Spironolactone given  ESRD.  Still concerned that Abdominal discomfort related to right sided HF.  Will ask [facility] to  see.  With history of frequent syncope (while sitting on bed), ? whether he could be having arrhythmias given his severe LV dysfunction.  Will get a 30 day heart monitor and is negative then consider ILR.   Signed: Armanda Magic MD [facility] 11/16/2014

## 2015-11-16 NOTE — Progress Notes (Deleted)
[facility] Progress Note  Dialysis: MWF [name] 4h F200 500/800 90.5kg (lowest post wt 93kg last 2 wks) 2/2 bath Hep none [name] 6 ug Venofer 1000 IV q HD thru 3/10   Assessment/Plan: 1 Syncope - no specific etiology 2 Chest pain - cards following 3 Chronic R / L heart failure - showed severe LV dysfunction, mod-severe pulm HTN and normal PCWP 4 Volume- PCWP was 12 at cath yesterday which is normal. Will increase dry weight to 94 kg. Allow some pedal edema to avoid syncope given severe LV/ RV failure.   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 DM - BS lowish - going for heart cath - receiving D50 13. Leukopenia and thrombocytopenia- baseline WBC 2.5 - 3.3;; plts 80s - 100 baseline 14. DIspo - stable for dc from our standpoint.   Plan - HD today, increasing dry wt based on cath findings and recent syncopal episodes. New dry wt of 94 kg   [name] MD [facility] pager [phone]    cell [phone] 11/16/2015, 10:37 AM     Subjective:   No complaints;  Objective Filed Vitals:   11/15/15 1650 11/15/15 1750 11/15/15 1956 11/16/15 0428  BP: 109/82 105/85 123/89 128/82  Pulse:   94 87  Temp:   98.6 F (37 C) 98.9 F (37.2 C)  TempSrc:   Oral Oral  Resp:   16 19  Weight:    95.709 kg (211 lb)  SpO2:   96% 97%   Physical Exam General: NAD sitting on side of bed Heart: irreg Lungs: crackles at bases Abdomen: soft Extremities: + LE edema Skin: very dry Dialysis Access: right upper AVF + bruit    Additional Objective Labs: Basic Metabolic Panel:  Recent Labs Lab 11/15/15 0401 11/15/15 1548 11/16/15 0353  NA 140 137 136  K 4.6 4.9 4.6  CL 96* 95* 96*  CO2 28 26 25   GLUCOSE 84 108* 114*  BUN 23* 26* 32*  CREATININE 6.84* 7.83* 8.48*   CALCIUM 8.9 9.5 9.3  PHOS 4.9* 5.3* 5.4*   Liver Function Tests:  Recent Labs Lab 11/13/15 1718 11/14/15 0838 11/15/15 0401 11/15/15 1548 11/16/15 0353  AST 31 29  --   --   --   ALT 14* 13*  --   --   --   ALKPHOS 155* 144*  --   --   --   BILITOT 1.6* 1.4*  --   --   --   PROT 7.7 7.1  --   --   --   ALBUMIN 3.3* 3.2* 3.1* 3.5 3.2*    Recent Labs Lab 11/14/15 0345  LIPASE 38   CBC:  Recent Labs Lab 11/13/15 1704 11/14/15 0838 11/15/15 0401 11/15/15 1548 11/16/15 0353  WBC 2.7* 2.1* 2.2* 2.4* 2.3*  NEUTROABS 1.4*  --  1.1*  --   --   HGB 11.3* 11.0* 11.4* 11.9* 10.9*  HCT 32.4* 32.8* 33.6* 35.2* 33.8*  MCV 84.4 83.7 85.5 86.5 85.6  PLT 95* 95* 88* 95* 85*  Cardiac Enzymes:  Recent Labs Lab 11/13/15 2000 11/14/15 0125 11/14/15 0345 11/14/15 0837 11/14/15 1806  TROPONINI 0.06* 0.06* 0.06* 0.06* 0.07*   CBG:  Recent Labs Lab 11/14/15 1136 11/14/15 2302 11/15/15 0841 11/15/15  1155 11/16/15 0746  GLUCAP 66 103* 75 69 79   I Studies/Results: No results found. Medications:   . antiseptic oral rinse  7 mL Mouth Rinse BID  . aspirin EC  81 mg Oral Daily  . B-complex with vitamin C  1 tablet Oral Daily  . carvedilol  3.125 mg Oral BID WC  . doxercalciferol  6 mcg Intravenous Q M,W,F-HD  . feeding supplement (NEPRO CARB STEADY)  237 mL Oral BID BM  . pantoprazole  40 mg Oral BID AC  . sevelamer carbonate  2,400 mg Oral TID WC  . sodium chloride flush  3 mL Intravenous Q12H  . sodium chloride flush  3 mL Intravenous Q12H

## 2015-11-16 NOTE — Progress Notes (Signed)
Occupational Therapy Treatment and Discharge Patient Details Name: Trevor [name] MRN: [id number] DOB: [birth year] Today's Date: 11/16/2015    History of present illness 65 y.o. male with a history of HTN, ESRD on HD, chronic A. Fib, prior history of DVT/PE, GERD, gout, combined systolic and diastolic heart failure, OSA, syncopy, hepatosplenomegaly, CAD status post remote MI in 1990, chronic abdominal pain who presented to [facility] ED 3/7 with complaint of chest pain/abdominal pain.   OT comments  Pt is functioning independently in self care and ADL transfers. Educated in energy conservation. No further OT needs.  Follow Up Recommendations  No OT follow up    Equipment Recommendations  None recommended by OT    Recommendations for Other Services      Precautions / Restrictions Restrictions Weight Bearing Restrictions: No       Mobility Bed Mobility               General bed mobility comments: pt sitting EOB  Transfers Overall transfer level: Needs assistance Equipment used: None   Sit to Stand: Independent              Balance                                   ADL Overall ADL's : Independent                                     Functional mobility during ADLs: Independent General ADL Comments: Pt able to demonstrate independence in grooming, toileting and tub transfer. Educated in energy conservation and provided handout.      Vision                     Perception     Praxis      Cognition   Behavior During Therapy: WFL for tasks assessed/performed Overall Cognitive Status: Within Functional Limits for tasks assessed                       Extremity/Trunk Assessment               Exercises     Shoulder Instructions       General Comments      Pertinent Vitals/ Pain       Pain Assessment: Faces Faces Pain Scale: No hurt  Home Living                                          Prior Functioning/Environment              Frequency       Progress Toward Goals  OT Goals(current goals can now be found in the care plan section)  Progress towards OT goals: Goals met/education completed, patient discharged from [place] All goals met and education completed, patient discharged from OT services    Co-evaluation                 End of Session     Activity Tolerance Patient tolerated treatment well   Patient Left in bed;with call bell/phone within reach   Nurse Communication          Time: 1123-1140  OT Time Calculation (min): 17 min  Charges: OT General Charges $OT Visit: 1 Procedure OT Treatments $Self Care/Home Management : 8-22 mins  Malka So 11/16/2015, 11:46 AM  [phone]

## 2015-11-16 NOTE — Progress Notes (Signed)
This RN attempted to reassess cramping in patient.  Pt ignored this Charity fundraiser.  This RN then repeated himself, in case he wasn't heard.  Pt ignored this Charity fundraiser.  Pt then asked to speak to Museum/gallery curator.  Floor manager informed.  Will continue to monitor.

## 2015-11-16 NOTE — Progress Notes (Signed)
PT Cancellation Note  Patient Details Name: Trevor [name] MRN: [id number] DOB: Oct 23, 1950   Cancelled Treatment:    Reason Eval/Treat Not Completed: Patient at procedure or test/unavailable.  Pt currently in HD.  Will f/u another time.   Sarim Rothman, Alison Murray 11/16/2015, 1:38 PM

## 2015-11-16 NOTE — Care Management Note (Signed)
Case Management Note  Patient Details  Name: Trevor [name] MRN: [id number] Date of Birth: Jun 28, 1951  Subjective/Objective:   Pt admitted for Chest Pain. Hx ESRD.                  Action/Plan: Physical Therapy to consult with pt for disposition needs. CM will continue to monitor.   Expected Discharge Date:                  Expected Discharge Plan:  Home/Self Care  In-House Referral:     Discharge planning Services  CM Consult  Post Acute Care Choice:    Choice offered to:     DME Arranged:    DME Agency:     HH Arranged:    HH Agency:     Status of Service:  In process, will continue to follow  Medicare Important Message Given:    Date Medicare IM Given:    Medicare IM give by:    Date Additional Medicare IM Given:    Additional Medicare Important Message give by:     If discussed at Long Length of Stay Meetings, dates discussed:    Additional Comments:  Gala Lewandowsky, RN 11/16/2015, 11:35 AM

## 2015-11-17 ENCOUNTER — Other Ambulatory Visit: Payer: Self-pay | Admitting: Physician Assistant

## 2015-11-17 DIAGNOSIS — K7460 Unspecified cirrhosis of liver: Secondary | ICD-10-CM

## 2015-11-17 DIAGNOSIS — R55 Syncope and collapse: Secondary | ICD-10-CM

## 2015-11-17 LAB — CBC
HEMATOCRIT: 34.1 % — AB (ref 39.0–52.0)
HEMOGLOBIN: 11.1 g/dL — AB (ref 13.0–17.0)
MCH: 28 pg (ref 26.0–34.0)
MCHC: 32.6 g/dL (ref 30.0–36.0)
MCV: 86.1 fL (ref 78.0–100.0)
Platelets: 81 10*3/uL — ABNORMAL LOW (ref 150–400)
RBC: 3.96 MIL/uL — AB (ref 4.22–5.81)
RDW: 17.2 % — ABNORMAL HIGH (ref 11.5–15.5)
WBC: 2.3 10*3/uL — ABNORMAL LOW (ref 4.0–10.5)

## 2015-11-17 LAB — BASIC METABOLIC PANEL
Anion gap: 12 (ref 5–15)
BUN: 17 mg/dL (ref 6–20)
CHLORIDE: 96 mmol/L — AB (ref 101–111)
CO2: 28 mmol/L (ref 22–32)
CREATININE: 6.05 mg/dL — AB (ref 0.61–1.24)
Calcium: 9.4 mg/dL (ref 8.9–10.3)
GFR calc Af Amer: 10 mL/min — ABNORMAL LOW (ref >60)
GFR calc non Af Amer: 9 mL/min — ABNORMAL LOW (ref >60)
Glucose, Bld: 114 mg/dL — ABNORMAL HIGH (ref 65–99)
POTASSIUM: 4.2 mmol/L (ref 3.5–5.1)
SODIUM: 136 mmol/L (ref 135–145)

## 2015-11-17 LAB — GLUCOSE, CAPILLARY: GLUCOSE-CAPILLARY: 78 mg/dL (ref 65–99)

## 2015-11-17 MED ORDER — CARVEDILOL 3.125 MG PO TABS: 3.1250 mg | ORAL_TABLET | Freq: Two times a day (BID) | ORAL | Status: DC

## 2015-11-17 MED ORDER — SACCHAROMYCES BOULARDII 250 MG PO CAPS
250.0000 mg | ORAL_CAPSULE | Freq: Once | ORAL | Status: AC
  Administered 2015-11-17: 250 mg via ORAL
  Filled 2015-11-17: qty 1

## 2015-11-17 MED ORDER — PANTOPRAZOLE SODIUM 40 MG PO TBEC: 40.0000 mg | DELAYED_RELEASE_TABLET | Freq: Two times a day (BID) | ORAL | Status: DC

## 2015-11-17 MED ORDER — ASPIRIN 81 MG PO TBEC: 81.0000 mg | DELAYED_RELEASE_TABLET | Freq: Every day | ORAL | Status: DC

## 2015-11-17 MED ORDER — NITROGLYCERIN 0.4 MG SL SUBL: 0.4000 mg | SUBLINGUAL_TABLET | SUBLINGUAL | Status: AC | PRN

## 2015-11-17 MED ORDER — GI COCKTAIL ~~LOC~~: 30.0000 mL | Freq: Three times a day (TID) | ORAL | Status: DC | PRN

## 2015-11-17 NOTE — Discharge Summary (Signed)
Physician Discharge Summary  Trevor [name] ZOX:[id number] DOB: 10-Sep-1950 DOA: 11/13/2015  PCP: [name], MD  Admit date: 11/13/2015 Discharge date: 11/17/2015  Time spent: 65 minutes  Recommendations for Outpatient Follow-up:  1. Follow-up with SANDERS,ROBYN N, MDIn 2 weeks. 2. Follow-up at hemodialysis center as scheduled. 3. Follow-up with cardiology, Dr. Armanda Magic in 2-3 weeks. 4. Patient will be set up for outpatient 30 day Holter monitor per cardiology.   Discharge Diagnoses:  Principal Problem:   Chest pain Active Problems:   Obstructive sleep apnea   Essential hypertension   Secondary renal hyperparathyroidism (HCC)   ESRD on hemodialysis (HCC)   GERD (gastroesophageal reflux disease)   DVT of lower extremity (deep venous thrombosis) RLE   Pulmonary embolism (HCC)   Ascites   Pancytopenia (HCC)   Abdominal pain, chronic, epigastric   Chronic atrial fibrillation (HCC)   Abdominal distension   Palliative care encounter   Bloating   Hepatic cirrhosis (HCC)   Hepatosplenomegaly   Chronic combined systolic and diastolic heart failure (HCC)   Acute on chronic systolic right heart failure (HCC)   Elevated troponin   NICM (nonischemic cardiomyopathy) (HCC)   Syncope and collapse   Discharge Condition: stable and improved  Diet recommendation: heart healthy  Filed Weights   11/16/15 1254 11/16/15 1718 11/17/15 0455  Weight: 96.4 kg (212 lb 8.4 oz) 95.7 kg (210 lb 15.7 oz) 95.346 kg (210 lb 3.2 oz)    History of present illness:  Per Dr Margarita Mail is a 65 y.o. male with PMH of end-stage renal disease on HD (MWF), biventricular heart failure, A. 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  Patient reported  that he started having sudden onset chest pain one day prior to admission. His chest pain was located in the frontal chest, 9 out of 10 in severity, constant, pleuritic, nonradiating. It is aggravated by deep breath. It is associated with a shortness of breath. He also has intermittent bilateral calf pain. He also reported having constant abdominal pain, nausea and vomiting, but no diarrhea. He vomited twice. Patient did not have fever, chills, symptoms of UTI or unilateral weakness.  In ED, patient was found to have troponin 0.06-->0.06, pancytopenia with WBC 2.7, hemoglobin 11.3, platelet of 95, temperature 99.1, creatinine 9.60, bicarbonate of 25, BUN 45, lactate 1.49--> 1.53. Chest x-ray showed no infiltration. Abdominal ultrasound showed nodular hepatic contours, with diffuse gallbladder wall thickening and mild splenomegaly, consistent with chronic liver disease; small volume perihepatic ascites; ring down artifact in the gallbladder wall, may reflect adenomyomatosis, shrunken echogenic kidneys consistent chronic renal disease. CTA showed no pulmonary embolus; prominent right heart distension with contrast refluxing into the hepatic veins and IVC consistent with right heart failure. This appears chronic but advanced in degree; Small pericardial effusion; ultifocal calcified mediastinal adenopathy, sequela of granulomatous disease versus sarcoidosis; chronic hepatosplenomegaly, small volume intra-abdominal ascites.  EKG: Independently reviewed. QTC 509, atrial fibrillation, bifascicular T-wave in V2-V4, mild T-wave inversion in V5-V6.   Hospital Course:   #1 chest pain/chronic biventricular heart failure with acute on chronic systolic right-sided heart failure. Questionable etiology. EKG with no acute abnormalities. Cardiac enzymes elevated at 0.06 however seemed to plateau and elevated cardiac enzymes likely secondary to chronic kidney disease or volume overload. CT angiogram negative for PE. Patient  with mildly distended  abdomen. Abdominal ultrasound with no acute abnormalities except splenomegaly. Patient stated has had a bowel movement. 2-D echo with EF of 30-35% with diffuse hypokinesis. Patient status post left and right heart cavity a catheterization with mid RCA lesion 20% stenosed and severe left ventricular systolic dysfunction with a EF of 15-20%. Significant elevation of right-sided heart pressures with moderate severe pulmonary hypertension with a PA pressure of 55/29 and a mean pressure of 42. Patient was seen in consultation by cardiology and followed by cardiology throughout the hospitalization. Patient was maintained on aspirin, PPI twice a day. Patient on hemodialysis full volume overload. Per cardiology.  #2 chronic atrial fibrillation with CHA2DS2VASC 3  Currently rate controlled. Patient has refused anticoagulation in the past secondary to prior history of GI bleed. Patient also does have a thrombocytopenia which is chronic. Continue aspirin. Per cardiology.  #3 mildly distended abdomen/chronic abdominal pain Patient states he's had bowel movements yesterday after probiotics. Abdominal ultrasound with nodular hepatic contour and splenomegaly with findings consistent with chronic liver disease. The prior discharge summary CT abdomen and pelvis on 10/14/2015 with chronic granulomatous disease with adenopathy and splenomegaly and volume overload. Patient had a prior liver biopsy December 2015 which showed sinusoidal dilatation and scattered foci hepatitis and negative for cirrhosis. Patient has had a normal gastric emptying study in 2014. Colonoscopy in 2007 showed sigmoid tics. Acute hepatitis serologies have been negative. FOBT negative. GI recommended outpatient EGD and colonoscopy. Patient was placed on PPI twice daily. Placed on probiotics. Follow.  #4 syncope Patient with prior history of syncope in the setting of atrial fibrillation in 2014. Patient was seen in the ED on  February 2017 for syncopal episode have at that time was noted to be hypotensive. 2-D echo without any valvular stenosis. Patient also did state had some syncopal episodes while sitting on the side of his bed. Is also concern for arrhythmia in the patient with a low ejection fraction,and as such patient will be set up for 30 day Holter monitor per cardiology.  #5 elevated troponin Likely secondary to volume overload. See problem #1.  #6 hypertension Stable. Patient was initially maintained on his home regimen of labetalol however this was changed to Coreg per cardiology. Patient was also on hydralazine as needed.  #7 end-stage renal disease on hemodialysis. Monday Wednesday Friday Patient was seen in consultation by nephrology and patient underwent hemodialysis per nephrology. Outpatient follow-up.  #8 pancytopenia Likely secondary to cirrhosis and end-stage renal disease. No bleeding. Remained stable throughout the hospitalization.  #9 hepatic cirrhosis Patient with stable hepatic function.   #10 gastroesophageal reflux disease PPI has been increased to twice a day. GI cocktail when necessary.  #11 history of DVT/PE Patient was on Coumadin. Discontinued secondary to spontaneous back hematoma. Patient has refused anticoagulation. Lower extremity dopplers negative.  Procedures:  CT angiogram chest 11/14/2015  Chest x-ray 11/13/2015  Abdominal ultrasound 11/14/2015  2-D echo 11/13/2015  Left and right Cardiac catheterization 11/15/2015  Lower extremity Dopplers 11/15/2015    Consultations:  Nephrology: Dr. Arlean Hopping 11/14/2015  Cardiology: Dr. Armanda Magic 11/14/2015    Discharge Exam: Filed Vitals:   11/16/15 2104 11/17/15 0455  BP: 136/94 132/86  Pulse: 84   Temp: 97.6 F (36.4 C) 98.2 F (36.8 C)  Resp: 20 16    General: NAD Cardiovascular: Irregularly irregular Respiratory: CTAB  Discharge Instructions   Discharge Instructions    Diet - low sodium  heart healthy    Complete by:  As directed  Discharge instructions    Complete by:  As directed   Follow up with Dr Armanda Magic, cardiology in 2 weeks. Follow up at Dialysis center on your scheduled days. Follow up with [name], MD in 2 weeks.     Increase activity slowly    Complete by:  As directed           Current Discharge Medication List    START taking these medications   Details  Alum & Mag Hydroxide-Simeth (GI COCKTAIL) SUSP suspension Take 30 mLs by mouth 3 (three) times daily as needed for indigestion. Shake well. Qty: 300 mL, Refills: 0    aspirin EC 81 MG EC tablet Take 1 tablet (81 mg total) by mouth daily.    carvedilol (COREG) 3.125 MG tablet Take 1 tablet (3.125 mg total) by mouth 2 (two) times daily with a meal. Qty: 60 tablet, Refills: 3    nitroGLYCERIN (NITROSTAT) 0.4 MG SL tablet Place 1 tablet (0.4 mg total) under the tongue every 5 (five) minutes as needed for chest pain. Qty: 15 tablet, Refills: 0      CONTINUE these medications which have CHANGED   Details  pantoprazole (PROTONIX) 40 MG tablet Take 1 tablet (40 mg total) by mouth 2 (two) times daily before a meal. Qty: 60 tablet, Refills: 4      CONTINUE these medications which have NOT CHANGED   Details  B Complex-C (B-COMPLEX WITH VITAMIN C) tablet Take 1 tablet by mouth daily.    Nutritional Supplements (FEEDING SUPPLEMENT, NEPRO CARB STEADY,) LIQD Take 237 mLs by mouth 2 (two) times daily between meals. Qty: 60 Can, Refills: 0    oxyCODONE-acetaminophen (PERCOCET/ROXICET) 5-325 MG tablet Take 1 tablet by mouth every 6 (six) hours as needed. Pain Refills: 0    sevelamer carbonate (RENVELA) 800 MG tablet Take 4 tablets (3,200 mg total) by mouth 3 (three) times daily with meals. Qty: 360 tablet, Refills: 3      STOP taking these medications     labetalol (NORMODYNE) 200 MG tablet        Allergies  Allergen Reactions  . Pork-Derived Products Nausea And Vomiting    Culture    Follow-up Information    Follow up with [name], MD. Schedule an appointment as soon as possible for a visit in 2 weeks.   Specialty:  Cardiology   Why:  f/u in 2-3 weeks   Contact information:   [address] [place] [address] [phone]       Follow up with [name], MD. Schedule an appointment as soon as possible for a visit in 2 weeks.   Specialty:  Internal Medicine   Contact information:   [address] [place] [address] [phone]       Please follow up.   Why:  follow up at dialysis center as scheduled.       The results of significant diagnostics from this hospitalization (including imaging, microbiology, ancillary and laboratory) are listed below for reference.    Significant Diagnostic Studies: Dg Chest 2 View  11/13/2015  CLINICAL DATA:  Chest pain and shortness of breath today, cough for 1 month, hypertension, CHF, gout, GERD, prior pulmonary embolism, coronary artery disease post MI, atrial fibrillation/flutter, end-stage renal disease on dialysis EXAM: CHEST  2 VIEW COMPARISON:  10/31/2015 FINDINGS: Enlargement of cardiac silhouette with pulmonary vascular congestion. Calcified tortuous aorta. Calcified RIGHT paratracheal lymph nodes again seen. Interstitial infiltrates in the perihilar regions likely reflecting  mild chronic CHF. No segmental consolidation, pleural effusion or pneumothorax. Osseous mineralization normal without acute bony abnormality. IMPRESSION: Enlargement of cardiac silhouette with pulmonary venous hypertension and mild chronic failure. No new abnormalities. Electronically Signed   By: Ulyses Southward M.D.   On: 11/13/2015 17:06   Dg Chest 2 View  10/31/2015  CLINICAL DATA:  Dialysis patient reports he is not been feeling well for over 1 month. Chest pain, pressure and abdominal bloating during dialysis. Initial encounter. EXAM: CHEST  2 VIEW COMPARISON:  Single view of the chest 10/26/2015,  09/07/2014 and CT chest 09/05/2014. FINDINGS: There is marked cardiomegaly. No pneumothorax or pleural effusion is identified. Pulmonary vascular congestion is seen but there is no edema. Calcified mediastinal and hilar lymphadenopathy is compatible with old granulomatous disease or sarcoid and appears unchanged. IMPRESSION: Cardiomegaly without acute disease. Electronically Signed   By: Drusilla Kanner M.D.   On: 10/31/2015 16:10   Ct Angio Chest Pe W/cm &/or Wo Cm  11/14/2015  CLINICAL DATA:  Pleuritic chest pain. History of pulmonary embolus and DVT. EXAM: CT ANGIOGRAPHY CHEST WITH CONTRAST TECHNIQUE: Multidetector CT imaging of the chest was performed using the standard protocol during bolus administration of intravenous contrast. Multiplanar CT image reconstructions and MIPs were obtained to evaluate the vascular anatomy. CONTRAST:  80mL OMNIPAQUE IOHEXOL 350 MG/ML SOLN COMPARISON:  Chest radiograph 1 day prior.  Chest CT 09/05/2014 FINDINGS: Resolution of previous filling defect in right middle lobe pulmonary artery. No new filling defects or acute pulmonary embolus. Multi chamber cardiomegaly, with prominent right atrial dilatation and significant contrast refluxing into the hepatic veins and IVC. Small pericardial effusion. Thoracic aorta with scattered atherosclerosis. No aneurysm. Coronary artery calcifications are seen. Extensive calcified mediastinal and bilateral hilar adenopathy. Calcified granuloma in the right middle lobe. Heterogeneous lower lobe ground-glass opacities and bronchial thickening. A chronic triangular opacity in the posterior medial right upper lobe adjacent to right pleural thickening/minimal effusion, most consistent with chronic atelectasis. Evaluation of the upper abdomen demonstrates hepatosplenomegaly and small volume perihepatic ascites. Atrophic kidneys. There are no acute or suspicious osseous abnormalities. Increased bone mineral density. Review of the MIP images confirms  the above findings. IMPRESSION: 1. No pulmonary embolus. 2. Prominent right heart distension with contrast refluxing into the hepatic veins and IVC consistent with right heart failure. This appears chronic but advanced in degree. 3. Small pericardial effusion. 4. Multifocal calcified mediastinal adenopathy, sequela of granulomatous disease versus sarcoidosis. 5. Chronic hepatosplenomegaly. Small volume intra-abdominal ascites. Electronically Signed   By: Rubye Oaks M.D.   On: 11/14/2015 03:45   US Abdomen Complete  11/14/2015  CLINICAL DATA:  Abdominal distension. EXAM: ABDOMEN ULTRASOUND COMPLETE COMPARISON:  CT 05/02/2016. FINDINGS: Gallbladder: Physiologically distended. Diffuse wall thickening measuring up to 9 mm. No gallstones visualized. No intraluminal sludge. Ring down artifact noted about the fundus. No sonographic Murphy sign noted by sonographer. Small amount of pericholecystic ascites. Common bile duct: Diameter: 3-4 mm. Liver: Hypodense lesion on CT not well seen sonographically. Within normal limits in parenchymal echogenicity. Nodular hepatic contours. Normal directional flow in the main portal vein. IVC: Prominent in diameter. Pancreas: Not well visualized. Spleen: Prominent size measuring 13.2 x 6.4 x 6.2 cm, splenic volume 274 mL. Right Kidney: Length: 8.3 cm. Atrophic with thinning of the renal parenchyma and increased echogenicity, patient with history of chronic renal disease on dialysis. No mass or hydronephrosis visualized. Left Kidney: Length: 6.6 cm. Atrophic with thinning of the renal parenchyma and increased renal echogenicity. Probable cyst arising  from the lateral kidney measuring 1.9 cm. No solid mass or hydronephrosis visualized. Abdominal aorta: No aneurysm visualized. Proximal aorta measures 3 cm, mid and distal aorta obscured. Other findings: Small volume perihepatic ascites. IMPRESSION: 1. Nodular hepatic contours, with diffuse gallbladder wall thickening and mild  splenomegaly. Findings consistent with chronic liver disease. Small volume perihepatic ascites. 2. Ring down artifact in the gallbladder wall, may reflect adenomyomatosis. 3. Shrunken echogenic kidneys consistent chronic renal disease. Electronically Signed   By: Rubye Oaks M.D.   On: 11/14/2015 01:22   Dg Chest Port 1 View  10/26/2015  CLINICAL DATA:  Syncope. EXAM: PORTABLE CHEST 1 VIEW COMPARISON:  September 07, 2014. FINDINGS: Stable cardiomegaly. No pneumothorax or pleural effusion is noted. No acute pulmonary disease is noted. Stable mild bibasilar scarring. Bony thorax is unremarkable. IMPRESSION: No acute cardiopulmonary abnormality seen. Electronically Signed   By: Lupita Raider, M.D.   On: 10/26/2015 11:16    Microbiology: Recent Results (from the past 240 hour(s))  MRSA PCR Screening     Status: None   Collection Time: 11/14/15  8:34 PM  Result Value Ref Range Status   MRSA by PCR NEGATIVE NEGATIVE Final    Comment:        The GeneXpert MRSA Assay (FDA approved for NASAL specimens only), is one component of a comprehensive MRSA colonization surveillance program. It is not intended to diagnose MRSA infection nor to guide or monitor treatment for MRSA infections.      Labs: Basic Metabolic Panel:  Recent Labs Lab 11/14/15 0838 11/15/15 0401 11/15/15 1548 11/16/15 0353 11/17/15 0549  NA 137 140 137 136 136  K 5.3* 4.6 4.9 4.6 4.2  CL 99* 96* 95* 96* 96*  CO2 19* 28 26 25 28   GLUCOSE 67 84 108* 114* 114*  BUN 51* 23* 26* 32* 17  CREATININE 10.71* 6.84* 7.83* 8.48* 6.05*  CALCIUM 9.3 8.9 9.5 9.3 9.4  MG 2.6*  --   --   --   --   PHOS  --  4.9* 5.3* 5.4*  --    Liver Function Tests:  Recent Labs Lab 11/13/15 1718 11/14/15 0838 11/15/15 0401 11/15/15 1548 11/16/15 0353  AST 31 29  --   --   --   ALT 14* 13*  --   --   --   ALKPHOS 155* 144*  --   --   --   BILITOT 1.6* 1.4*  --   --   --   PROT 7.7 7.1  --   --   --   ALBUMIN 3.3* 3.2* 3.1* 3.5  3.2*    Recent Labs Lab 11/14/15 0345  LIPASE 38   No results for input(s): AMMONIA in the last 168 hours. CBC:  Recent Labs Lab 11/13/15 1704 11/14/15 0838 11/15/15 0401 11/15/15 1548 11/16/15 0353 11/17/15 0549  WBC 2.7* 2.1* 2.2* 2.4* 2.3* 2.3*  NEUTROABS 1.4*  --  1.1*  --   --   --   HGB 11.3* 11.0* 11.4* 11.9* 10.9* 11.1*  HCT 32.4* 32.8* 33.6* 35.2* 33.8* 34.1*  MCV 84.4 83.7 85.5 86.5 85.6 86.1  PLT 95* 95* 88* 95* 85* 81*   Cardiac Enzymes:  Recent Labs Lab 11/13/15 2000 11/14/15 0125 11/14/15 0345 11/14/15 0837 11/14/15 1806  TROPONINI 0.06* 0.06* 0.06* 0.06* 0.07*   BNP: BNP (last 3 results) No results for input(s): BNP in the last 8760 hours.  ProBNP (last 3 results) No results for input(s): PROBNP in the last 8760  hours.  CBG:  Recent Labs Lab 11/14/15 2302 11/15/15 0841 11/15/15 1155 11/16/15 0746 11/17/15 0748  GLUCAP 103* 75 69 79 78       Signed:  THOMPSON,DANIEL MD.  [facility] 11/17/2015, 11:24 AM

## 2015-11-17 NOTE — Progress Notes (Signed)
SUBJECTIVE:  No complaints  OBJECTIVE:   Vitals:   Filed Vitals:   11/16/15 1700 11/16/15 1718 11/16/15 2104 11/17/15 0455  BP: 113/66 134/92 136/94 132/86  Pulse: 76 79 84   Temp:  97 F (36.1 C) 97.6 F (36.4 C) 98.2 F (36.8 C)  TempSrc:  Oral Oral Oral  Resp: 16 22 20 16   Weight:  210 lb 15.7 oz (95.7 kg)  210 lb 3.2 oz (95.346 kg)  SpO2:   98% 100%   I&O's:   Intake/Output Summary (Last 24 hours) at 11/17/15 0951 Last data filed at 11/17/15 4098  Gross per 24 hour  Intake    720 ml  Output    597 ml  Net    123 ml   TELEMETRY: Reviewed telemetry pt in atrial fibrillation:     PHYSICAL EXAM General: Well developed, well nourished, in no acute distress Head: Eyes PERRLA, No xanthomas.   Normal cephalic and atramatic  Lungs:   Clear bilaterally to auscultation and percussion. Heart:   Irregularly irregular S1 S2 Pulses are 2+ & equal. Abdomen: Bowel sounds are positive, abdomen soft and non-tender without masses Extremities:   No clubbing, cyanosis or edema.  DP +1 Neuro: Alert and oriented X 3. Psych:  Good affect, responds appropriately   LABS: Basic Metabolic Panel:  Recent Labs  11/91/47 1548 11/16/15 0353 11/17/15 0549  NA 137 136 136  K 4.9 4.6 4.2  CL 95* 96* 96*  CO2 26 25 28   GLUCOSE 108* 114* 114*  BUN 26* 32* 17  CREATININE 7.83* 8.48* 6.05*  CALCIUM 9.5 9.3 9.4  PHOS 5.3* 5.4*  --    Liver Function Tests:  Recent Labs  11/15/15 1548 11/16/15 0353  ALBUMIN 3.5 3.2*   No results for input(s): LIPASE, AMYLASE in the last 72 hours. CBC:  Recent Labs  11/15/15 0401  11/16/15 0353 11/17/15 0549  WBC 2.2*  < > 2.3* 2.3*  NEUTROABS 1.1*  --   --   --   HGB 11.4*  < > 10.9* 11.1*  HCT 33.6*  < > 33.8* 34.1*  MCV 85.5  < > 85.6 86.1  PLT 88*  < > 85* 81*  < > = values in this interval not displayed. Cardiac Enzymes:  Recent Labs  11/14/15 1806  TROPONINI 0.07*   BNP: Invalid input(s): POCBNP D-Dimer: No results for  input(s): DDIMER in the last 72 hours. Hemoglobin A1C: No results for input(s): HGBA1C in the last 72 hours. Fasting Lipid Panel: No results for input(s): CHOL, HDL, LDLCALC, TRIG, CHOLHDL, LDLDIRECT in the last 72 hours. Thyroid Function Tests: No results for input(s): TSH, T4TOTAL, T3FREE, THYROIDAB in the last 72 hours.  Invalid input(s): FREET3 Anemia Panel: No results for input(s): VITAMINB12, FOLATE, FERRITIN, TIBC, IRON, RETICCTPCT in the last 72 hours. Coag Panel:   Lab Results  Component Value Date   INR 1.39 11/15/2015   INR 1.47 11/14/2015   INR 1.30 09/05/2014    RADIOLOGY: Dg Chest 2 View  11/13/2015  CLINICAL DATA:  Chest pain and shortness of breath today, cough for 1 month, hypertension, CHF, gout, GERD, prior pulmonary embolism, coronary artery disease post MI, atrial fibrillation/flutter, end-stage renal disease on dialysis EXAM: CHEST  2 VIEW COMPARISON:  10/31/2015 FINDINGS: Enlargement of cardiac silhouette with pulmonary vascular congestion. Calcified tortuous aorta. Calcified RIGHT paratracheal lymph nodes again seen. Interstitial infiltrates in the perihilar regions likely reflecting mild chronic CHF. No segmental consolidation, pleural effusion or pneumothorax. Osseous mineralization  normal without acute bony abnormality. IMPRESSION: Enlargement of cardiac silhouette with pulmonary venous hypertension and mild chronic failure. No new abnormalities. Electronically Signed   By: Ulyses Southward M.D.   On: 11/13/2015 17:06   Dg Chest 2 View  10/31/2015  CLINICAL DATA:  Dialysis patient reports he is not been feeling well for over 1 month. Chest pain, pressure and abdominal bloating during dialysis. Initial encounter. EXAM: CHEST  2 VIEW COMPARISON:  Single view of the chest 10/26/2015, 09/07/2014 and CT chest 09/05/2014. FINDINGS: There is marked cardiomegaly. No pneumothorax or pleural effusion is identified. Pulmonary vascular congestion is seen but there is no edema.  Calcified mediastinal and hilar lymphadenopathy is compatible with old granulomatous disease or sarcoid and appears unchanged. IMPRESSION: Cardiomegaly without acute disease. Electronically Signed   By: Drusilla Kanner M.D.   On: 10/31/2015 16:10   Ct Angio Chest Pe W/cm &/or Wo Cm  11/14/2015  CLINICAL DATA:  Pleuritic chest pain. History of pulmonary embolus and DVT. EXAM: CT ANGIOGRAPHY CHEST WITH CONTRAST TECHNIQUE: Multidetector CT imaging of the chest was performed using the standard protocol during bolus administration of intravenous contrast. Multiplanar CT image reconstructions and MIPs were obtained to evaluate the vascular anatomy. CONTRAST:  55mL OMNIPAQUE IOHEXOL 350 MG/ML SOLN COMPARISON:  Chest radiograph 1 day prior.  Chest CT 09/05/2014 FINDINGS: Resolution of previous filling defect in right middle lobe pulmonary artery. No new filling defects or acute pulmonary embolus. Multi chamber cardiomegaly, with prominent right atrial dilatation and significant contrast refluxing into the hepatic veins and IVC. Small pericardial effusion. Thoracic aorta with scattered atherosclerosis. No aneurysm. Coronary artery calcifications are seen. Extensive calcified mediastinal and bilateral hilar adenopathy. Calcified granuloma in the right middle lobe. Heterogeneous lower lobe ground-glass opacities and bronchial thickening. A chronic triangular opacity in the posterior medial right upper lobe adjacent to right pleural thickening/minimal effusion, most consistent with chronic atelectasis. Evaluation of the upper abdomen demonstrates hepatosplenomegaly and small volume perihepatic ascites. Atrophic kidneys. There are no acute or suspicious osseous abnormalities. Increased bone mineral density. Review of the MIP images confirms the above findings. IMPRESSION: 1. No pulmonary embolus. 2. Prominent right heart distension with contrast refluxing into the hepatic veins and IVC consistent with right heart failure.  This appears chronic but advanced in degree. 3. Small pericardial effusion. 4. Multifocal calcified mediastinal adenopathy, sequela of granulomatous disease versus sarcoidosis. 5. Chronic hepatosplenomegaly. Small volume intra-abdominal ascites. Electronically Signed   By: Rubye Oaks M.D.   On: 11/14/2015 03:45   US Abdomen Complete  11/14/2015  CLINICAL DATA:  Abdominal distension. EXAM: ABDOMEN ULTRASOUND COMPLETE COMPARISON:  CT 05/02/2016. FINDINGS: Gallbladder: Physiologically distended. Diffuse wall thickening measuring up to 9 mm. No gallstones visualized. No intraluminal sludge. Ring down artifact noted about the fundus. No sonographic Murphy sign noted by sonographer. Small amount of pericholecystic ascites. Common bile duct: Diameter: 3-4 mm. Liver: Hypodense lesion on CT not well seen sonographically. Within normal limits in parenchymal echogenicity. Nodular hepatic contours. Normal directional flow in the main portal vein. IVC: Prominent in diameter. Pancreas: Not well visualized. Spleen: Prominent size measuring 13.2 x 6.4 x 6.2 cm, splenic volume 274 mL. Right Kidney: Length: 8.3 cm. Atrophic with thinning of the renal parenchyma and increased echogenicity, patient with history of chronic renal disease on dialysis. No mass or hydronephrosis visualized. Left Kidney: Length: 6.6 cm. Atrophic with thinning of the renal parenchyma and increased renal echogenicity. Probable cyst arising from the lateral kidney measuring 1.9 cm. No solid mass or hydronephrosis  visualized. Abdominal aorta: No aneurysm visualized. Proximal aorta measures 3 cm, mid and distal aorta obscured. Other findings: Small volume perihepatic ascites. IMPRESSION: 1. Nodular hepatic contours, with diffuse gallbladder wall thickening and mild splenomegaly. Findings consistent with chronic liver disease. Small volume perihepatic ascites. 2. Ring down artifact in the gallbladder wall, may reflect adenomyomatosis. 3. Shrunken echogenic  kidneys consistent chronic renal disease. Electronically Signed   By: Rubye Oaks M.D.   On: 11/14/2015 01:22   Dg Chest Port 1 View  10/26/2015  CLINICAL DATA:  Syncope. EXAM: PORTABLE CHEST 1 VIEW COMPARISON:  September 07, 2014. FINDINGS: Stable cardiomegaly. No pneumothorax or pleural effusion is noted. No acute pulmonary disease is noted. Stable mild bibasilar scarring. Bony thorax is unremarkable. IMPRESSION: No acute cardiopulmonary abnormality seen. Electronically Signed   By: Lupita Raider, M.D.   On: 10/26/2015 11:16    ASSESSMENT AND PLAN   Trevor [name] is a 65 y.o. male with a history of HTN, ESRD on HD, chronic A. Fib, prior history of DVT/PE, GERD, gout, combined systolic and diastolic heart failure, OSA, syncopy, hepatosplenomegaly, CAD status post remote MI in 1990, chronic abdominal pain who presented to [facility] ED 3/7 with complaint of chest pain/abdominal pain.  1. Chest pain: The patient's chest pain seems more of epigastric pain. EKG without acute abnormality. Troponin with flat trend. Likely due to elevated serum creatinine and volume overload. His main complaint is abdominal pain and he takes many pain medication. Patient has a pancytopenia with electrolytes abnormality. CTA negative for PE. .  - Cath showed No significant coronary obstructive disease with smooth 20% narrowing in the dominant RCA and normal left coronary circulation.  2. Chronic biventricular heart failure with acute right-sided heart failure : Abdominal ultrasound shows nodular hepatic contour with call for blood thickening and splenomegaly. Findings consistent with chronic liver disease. CTA negative for PE with evidence of right-sided heart failure.  Echocardiogram shows improvement in left ventricular function to 30-35% (EF 25-30% by echo 09/04/14), diffuse hypokinesis, aortic root dilation to 39 mm, dilation of ascending aorta, moderately dilated right ventricle, moderately dilated  right atrium, PA peak pressure moderately increased to 55 mmHg. Trivial pericardial effusion noted. Chest CT showed calcified lymphadenopathy Granulomatous disease vs. Sarcoid.? whether he could have cardiac sarcoid as etiology of DCM.Will refer to [facility] for further evaluation. - Cath showed nonobstructive coronaries with significant elevation of right sided heart pressures with moderately severe pulmonary hypertension with a PA pressure of 55/29 and a mean pressure 42. Severe left ventricular dysfunction with global hypokinesis and an ejection fraction of 15 to less than 20%. - The patient is on HD for volume management.  - Continue Coreg.  His BP tends to run low at HD which has limited use of HF drugs in the past.  BP stable so will try low dose Hydralazine  TID as well.  Cannot use aldactone due to ESRD.  If BP tolerates could try adding low dose long acting nitrates as well.    3. Chronic A. Fib: Rate is controlled. He has refused Coumadin after GI bleed.  4. Intractable abdominal pain: He is scheduled to have a colonoscope in 12/04/15. Complains of epigastric burning pain. GI eval sooner?  5: Syncope:  - First episode occurred in a setting of A. fib RVR 2014. - Seen in ED 2/17 for syncope episode at that time his blood pressure was low.  He states he has had 4 syncopal episodes recently.  The last one was while he was sitting on the bed and had a sudden onset of strange feeling in his body and passed out.  With history of frequent syncope (while sitting on bed), ? whether he could be having arrhythmias given his severe LV dysfunction. Will get a 30 day heart monitor and is negative then consider ILR. - Murmur noted on exam. Echocardiogram done shows aortic valve calcification without stenosis. No mitral valve stenosis or regurgitation.    [name], MD  11/17/2015  9:51 AM

## 2015-11-17 NOTE — Progress Notes (Signed)
[facility] Progress Note  Dialysis: MWF [name] 4h F200 500/800 90.5kg (lowest post wt 93kg last 2 wks) 2/2 bath Hep none [name] 6 ug Venofer 1000 IV q HD thru 3/10   Assessment: 1 Syncope - vol depletion, resolved, raising dry wt to 97kg 2 Chest pain - resolved 3 Chronic R / L heart failure - showed severe LV dysfunction, mod-severe pulm HTN and normal PCWP at 95kg 4 Volume- PCWP was 12 at cath yesterday which is normal at wt of 95-96kg.  He has gained significant lean body wt.  Has severe R heart failure so needs higher filling pressures which for him means permissive LE edema. Lungs are clear today at 96kg and CXR on 3/7 was clear at 97kg.  5 ESRD on HD MWF 6 HTN - labetalol dc'd, coreg started by cardiology for CHF. Watch bp's, decrease dose as prn 7 MBD cont renvela/ hectorol  8 Chron afib - no coumadin d/t hx bleeding 9 Chron abd pain 10 Chron hepatic/ splenic enlargement; US showed GB wall thickening 11 Hx DVT / PE in the past 12. DM - BS lowish - going for heart cath - receiving D50 13. Leukopenia and thrombocytopenia- baseline WBC 2.5 - 3.3;; plts 80s - 100 baseline 14. DIspo - stable for dc from our standpoint.   Plan - OK for dc today.  He would be a good candidate for home HD, i encouraged him to inquire about home HD. Raised dry wt to 97kg, needs permissive leg edema given severe R heart failure.   [name] MD [facility] pager [phone]    cell [phone] 11/17/2015, 11:19 AM     Subjective:   No complaints;  Objective Filed Vitals:   11/16/15 1700 11/16/15 1718 11/16/15 2104 11/17/15 0455  BP: 113/66 134/92 136/94 132/86  Pulse: 76 79 84   Temp:  97 F (36.1 C) 97.6 F (36.4 C) 98.2 F (36.8 C)  TempSrc:  Oral Oral Oral  Resp: 16 22 20 16   Weight:  95.7 kg (210 lb 15.7 oz)  95.346 kg (210 lb 3.2 oz)  SpO2:   98% 100%   Physical Exam General: NAD sitting on side of bed Heart: irreg Lungs: crackles  at bases Abdomen: soft Extremities: + LE edema Skin: very dry Dialysis Access: right upper AVF + bruit    Additional Objective Labs: Basic Metabolic Panel:  Recent Labs Lab 11/15/15 0401 11/15/15 1548 11/16/15 0353 11/17/15 0549  NA 140 137 136 136  K 4.6 4.9 4.6 4.2  CL 96* 95* 96* 96*  CO2 28 26 25 28   GLUCOSE 84 108* 114* 114*  BUN 23* 26* 32* 17  CREATININE 6.84* 7.83* 8.48* 6.05*  CALCIUM 8.9 9.5 9.3 9.4  PHOS 4.9* 5.3* 5.4*  --    Liver Function Tests:  Recent Labs Lab 11/13/15 1718 11/14/15 0838 11/15/15 0401 11/15/15 1548 11/16/15 0353  AST 31 29  --   --   --   ALT 14* 13*  --   --   --   ALKPHOS 155* 144*  --   --   --   BILITOT 1.6* 1.4*  --   --   --   PROT 7.7 7.1  --   --   --   ALBUMIN 3.3* 3.2* 3.1* 3.5 3.2*    Recent Labs Lab 11/14/15 0345  LIPASE 38   CBC:  Recent Labs Lab 11/13/15 1704 11/14/15 7680 11/15/15 0401 11/15/15 1548 11/16/15 0353 11/17/15 0549  WBC 2.7* 2.1* 2.2* 2.4* 2.3* 2.3*  NEUTROABS 1.4*  --  1.1*  --   --   --   HGB 11.3* 11.0* 11.4* 11.9* 10.9* 11.1*  HCT 32.4* 32.8* 33.6* 35.2* 33.8* 34.1*  MCV 84.4 83.7 85.5 86.5 85.6 86.1  PLT 95* 95* 88* 95* 85* 81*  Cardiac Enzymes:  Recent Labs Lab 11/13/15 2000 11/14/15 0125 11/14/15 0345 11/14/15 0837 11/14/15 1806  TROPONINI 0.06* 0.06* 0.06* 0.06* 0.07*   CBG:  Recent Labs Lab 11/14/15 2302 11/15/15 0841 11/15/15 1155 11/16/15 0746 11/17/15 0748  GLUCAP 103* 75 69 79 78   I Studies/Results: No results found. Medications:   . antiseptic oral rinse  7 mL Mouth Rinse BID  . aspirin EC  81 mg Oral Daily  . B-complex with vitamin C  1 tablet Oral Daily  . carvedilol  3.125 mg Oral BID WC  . doxercalciferol  6 mcg Intravenous Q M,W,F-HD  . feeding supplement (NEPRO CARB STEADY)  237 mL Oral BID BM  . pantoprazole  40 mg Oral BID AC  . saccharomyces boulardii  250 mg Oral BID  . sevelamer carbonate  2,400 mg Oral TID WC  . sodium chloride  flush  3 mL Intravenous Q12H

## 2015-11-19 DIAGNOSIS — N186 End stage renal disease: Secondary | ICD-10-CM | POA: Diagnosis not present

## 2015-11-19 DIAGNOSIS — D631 Anemia in chronic kidney disease: Secondary | ICD-10-CM | POA: Diagnosis not present

## 2015-11-19 DIAGNOSIS — N2581 Secondary hyperparathyroidism of renal origin: Secondary | ICD-10-CM | POA: Diagnosis not present

## 2015-11-19 DIAGNOSIS — D509 Iron deficiency anemia, unspecified: Secondary | ICD-10-CM | POA: Diagnosis not present

## 2015-11-19 LAB — VITAMIN D 25 HYDROXY (VIT D DEFICIENCY, FRACTURES): VIT D 25 HYDROXY: 11.5 ng/mL — AB (ref 30.0–100.0)

## 2015-11-20 ENCOUNTER — Telehealth: Payer: Self-pay | Admitting: Cardiology

## 2015-11-20 LAB — VITAMIN C: Vitamin C: 0.1 mg/dL — ABNORMAL LOW (ref 0.2–2.0)

## 2015-11-20 NOTE — Telephone Encounter (Signed)
New Message:  Pt said somebody just called him,but they were disconnected. He said please call him back after 11:30.

## 2015-11-20 NOTE — Telephone Encounter (Signed)
See 3/7 ECHO results.

## 2015-11-21 DIAGNOSIS — D631 Anemia in chronic kidney disease: Secondary | ICD-10-CM | POA: Diagnosis not present

## 2015-11-21 DIAGNOSIS — D509 Iron deficiency anemia, unspecified: Secondary | ICD-10-CM | POA: Diagnosis not present

## 2015-11-21 DIAGNOSIS — N2581 Secondary hyperparathyroidism of renal origin: Secondary | ICD-10-CM | POA: Diagnosis not present

## 2015-11-21 DIAGNOSIS — N186 End stage renal disease: Secondary | ICD-10-CM | POA: Diagnosis not present

## 2015-11-22 ENCOUNTER — Encounter: Payer: Self-pay | Admitting: Internal Medicine

## 2015-11-23 ENCOUNTER — Encounter: Payer: Self-pay | Admitting: Oncology

## 2015-11-23 DIAGNOSIS — N2581 Secondary hyperparathyroidism of renal origin: Secondary | ICD-10-CM | POA: Diagnosis not present

## 2015-11-23 DIAGNOSIS — D509 Iron deficiency anemia, unspecified: Secondary | ICD-10-CM | POA: Diagnosis not present

## 2015-11-23 DIAGNOSIS — D631 Anemia in chronic kidney disease: Secondary | ICD-10-CM | POA: Diagnosis not present

## 2015-11-23 DIAGNOSIS — N186 End stage renal disease: Secondary | ICD-10-CM | POA: Diagnosis not present

## 2015-11-26 DIAGNOSIS — D631 Anemia in chronic kidney disease: Secondary | ICD-10-CM | POA: Diagnosis not present

## 2015-11-26 DIAGNOSIS — N186 End stage renal disease: Secondary | ICD-10-CM | POA: Diagnosis not present

## 2015-11-26 DIAGNOSIS — N2581 Secondary hyperparathyroidism of renal origin: Secondary | ICD-10-CM | POA: Diagnosis not present

## 2015-11-26 DIAGNOSIS — D509 Iron deficiency anemia, unspecified: Secondary | ICD-10-CM | POA: Diagnosis not present

## 2015-11-28 ENCOUNTER — Inpatient Hospital Stay (HOSPITAL_COMMUNITY)
Admission: EM | Admit: 2015-11-28 | Discharge: 2015-12-03 | DRG: 377 | Disposition: A | Discharge status: Home / Self Care | Payer: Medicare Other | Attending: Internal Medicine | Admitting: Internal Medicine

## 2015-11-28 ENCOUNTER — Encounter (HOSPITAL_COMMUNITY): Payer: Self-pay | Admitting: Emergency Medicine

## 2015-11-28 ENCOUNTER — Inpatient Hospital Stay (HOSPITAL_COMMUNITY): Payer: Medicare Other

## 2015-11-28 ENCOUNTER — Emergency Department (HOSPITAL_COMMUNITY): Payer: Medicare Other

## 2015-11-28 DIAGNOSIS — R109 Unspecified abdominal pain: Secondary | ICD-10-CM | POA: Diagnosis not present

## 2015-11-28 DIAGNOSIS — K641 Second degree hemorrhoids: Secondary | ICD-10-CM | POA: Diagnosis present

## 2015-11-28 DIAGNOSIS — M109 Gout, unspecified: Secondary | ICD-10-CM | POA: Diagnosis present

## 2015-11-28 DIAGNOSIS — J101 Influenza due to other identified influenza virus with other respiratory manifestations: Secondary | ICD-10-CM | POA: Diagnosis present

## 2015-11-28 DIAGNOSIS — R188 Other ascites: Secondary | ICD-10-CM | POA: Diagnosis present

## 2015-11-28 DIAGNOSIS — R162 Hepatomegaly with splenomegaly, not elsewhere classified: Secondary | ICD-10-CM | POA: Diagnosis present

## 2015-11-28 DIAGNOSIS — I120 Hypertensive chronic kidney disease with stage 5 chronic kidney disease or end stage renal disease: Secondary | ICD-10-CM | POA: Diagnosis not present

## 2015-11-28 DIAGNOSIS — R1084 Generalized abdominal pain: Secondary | ICD-10-CM | POA: Diagnosis not present

## 2015-11-28 DIAGNOSIS — G609 Hereditary and idiopathic neuropathy, unspecified: Secondary | ICD-10-CM | POA: Diagnosis present

## 2015-11-28 DIAGNOSIS — I428 Other cardiomyopathies: Secondary | ICD-10-CM

## 2015-11-28 DIAGNOSIS — R1013 Epigastric pain: Secondary | ICD-10-CM

## 2015-11-28 DIAGNOSIS — D631 Anemia in chronic kidney disease: Secondary | ICD-10-CM | POA: Diagnosis not present

## 2015-11-28 DIAGNOSIS — Z7982 Long term (current) use of aspirin: Secondary | ICD-10-CM

## 2015-11-28 DIAGNOSIS — D509 Iron deficiency anemia, unspecified: Secondary | ICD-10-CM | POA: Diagnosis not present

## 2015-11-28 DIAGNOSIS — R05 Cough: Secondary | ICD-10-CM | POA: Diagnosis not present

## 2015-11-28 DIAGNOSIS — R509 Fever, unspecified: Secondary | ICD-10-CM | POA: Diagnosis not present

## 2015-11-28 DIAGNOSIS — G4733 Obstructive sleep apnea (adult) (pediatric): Secondary | ICD-10-CM | POA: Diagnosis present

## 2015-11-28 DIAGNOSIS — I4892 Unspecified atrial flutter: Secondary | ICD-10-CM | POA: Diagnosis present

## 2015-11-28 DIAGNOSIS — K921 Melena: Secondary | ICD-10-CM | POA: Diagnosis not present

## 2015-11-28 DIAGNOSIS — D61818 Other pancytopenia: Secondary | ICD-10-CM | POA: Diagnosis present

## 2015-11-28 DIAGNOSIS — Z87891 Personal history of nicotine dependence: Secondary | ICD-10-CM | POA: Diagnosis not present

## 2015-11-28 DIAGNOSIS — I420 Dilated cardiomyopathy: Secondary | ICD-10-CM | POA: Diagnosis present

## 2015-11-28 DIAGNOSIS — G8929 Other chronic pain: Secondary | ICD-10-CM | POA: Diagnosis not present

## 2015-11-28 DIAGNOSIS — I429 Cardiomyopathy, unspecified: Secondary | ICD-10-CM | POA: Diagnosis present

## 2015-11-28 DIAGNOSIS — I252 Old myocardial infarction: Secondary | ICD-10-CM

## 2015-11-28 DIAGNOSIS — K625 Hemorrhage of anus and rectum: Secondary | ICD-10-CM | POA: Diagnosis not present

## 2015-11-28 DIAGNOSIS — R161 Splenomegaly, not elsewhere classified: Secondary | ICD-10-CM | POA: Diagnosis not present

## 2015-11-28 DIAGNOSIS — K219 Gastro-esophageal reflux disease without esophagitis: Secondary | ICD-10-CM | POA: Diagnosis present

## 2015-11-28 DIAGNOSIS — K649 Unspecified hemorrhoids: Secondary | ICD-10-CM | POA: Diagnosis not present

## 2015-11-28 DIAGNOSIS — Z8249 Family history of ischemic heart disease and other diseases of the circulatory system: Secondary | ICD-10-CM | POA: Diagnosis not present

## 2015-11-28 DIAGNOSIS — Z86711 Personal history of pulmonary embolism: Secondary | ICD-10-CM | POA: Diagnosis not present

## 2015-11-28 DIAGNOSIS — Z833 Family history of diabetes mellitus: Secondary | ICD-10-CM | POA: Diagnosis not present

## 2015-11-28 DIAGNOSIS — R1031 Right lower quadrant pain: Secondary | ICD-10-CM

## 2015-11-28 DIAGNOSIS — Z841 Family history of disorders of kidney and ureter: Secondary | ICD-10-CM

## 2015-11-28 DIAGNOSIS — Z992 Dependence on renal dialysis: Secondary | ICD-10-CM | POA: Diagnosis not present

## 2015-11-28 DIAGNOSIS — R1011 Right upper quadrant pain: Secondary | ICD-10-CM

## 2015-11-28 DIAGNOSIS — I132 Hypertensive heart and chronic kidney disease with heart failure and with stage 5 chronic kidney disease, or end stage renal disease: Secondary | ICD-10-CM | POA: Diagnosis present

## 2015-11-28 DIAGNOSIS — K922 Gastrointestinal hemorrhage, unspecified: Secondary | ICD-10-CM | POA: Diagnosis present

## 2015-11-28 DIAGNOSIS — K2950 Unspecified chronic gastritis without bleeding: Secondary | ICD-10-CM | POA: Diagnosis not present

## 2015-11-28 DIAGNOSIS — I5042 Chronic combined systolic (congestive) and diastolic (congestive) heart failure: Secondary | ICD-10-CM | POA: Diagnosis present

## 2015-11-28 DIAGNOSIS — R55 Syncope and collapse: Secondary | ICD-10-CM | POA: Diagnosis not present

## 2015-11-28 DIAGNOSIS — N2581 Secondary hyperparathyroidism of renal origin: Secondary | ICD-10-CM | POA: Diagnosis present

## 2015-11-28 DIAGNOSIS — I482 Chronic atrial fibrillation, unspecified: Secondary | ICD-10-CM | POA: Diagnosis present

## 2015-11-28 DIAGNOSIS — N186 End stage renal disease: Secondary | ICD-10-CM | POA: Diagnosis present

## 2015-11-28 DIAGNOSIS — K761 Chronic passive congestion of liver: Secondary | ICD-10-CM | POA: Diagnosis present

## 2015-11-28 DIAGNOSIS — I739 Peripheral vascular disease, unspecified: Secondary | ICD-10-CM | POA: Diagnosis present

## 2015-11-28 DIAGNOSIS — Z86718 Personal history of other venous thrombosis and embolism: Secondary | ICD-10-CM

## 2015-11-28 DIAGNOSIS — R69 Illness, unspecified: Secondary | ICD-10-CM

## 2015-11-28 DIAGNOSIS — I509 Heart failure, unspecified: Secondary | ICD-10-CM | POA: Diagnosis not present

## 2015-11-28 DIAGNOSIS — I2510 Atherosclerotic heart disease of native coronary artery without angina pectoris: Secondary | ICD-10-CM | POA: Diagnosis present

## 2015-11-28 DIAGNOSIS — R1030 Lower abdominal pain, unspecified: Secondary | ICD-10-CM | POA: Diagnosis not present

## 2015-11-28 DIAGNOSIS — J111 Influenza due to unidentified influenza virus with other respiratory manifestations: Secondary | ICD-10-CM | POA: Diagnosis not present

## 2015-11-28 DIAGNOSIS — K2970 Gastritis, unspecified, without bleeding: Secondary | ICD-10-CM | POA: Diagnosis not present

## 2015-11-28 DIAGNOSIS — R52 Pain, unspecified: Secondary | ICD-10-CM

## 2015-11-28 DIAGNOSIS — R1010 Upper abdominal pain, unspecified: Secondary | ICD-10-CM | POA: Diagnosis not present

## 2015-11-28 HISTORY — DX: Hemorrhage of anus and rectum: K62.5

## 2015-11-28 HISTORY — DX: Disorder of kidney and ureter, unspecified: N28.9

## 2015-11-28 LAB — INFLUENZA PANEL BY PCR (TYPE A & B)
H1N1FLUPCR: NOT DETECTED
INFLAPCR: NEGATIVE
INFLBPCR: POSITIVE — AB

## 2015-11-28 LAB — COMPREHENSIVE METABOLIC PANEL
ALT: 17 U/L (ref 17–63)
AST: 58 U/L — ABNORMAL HIGH (ref 15–41)
Albumin: 2.9 g/dL — ABNORMAL LOW (ref 3.5–5.0)
Alkaline Phosphatase: 141 U/L — ABNORMAL HIGH (ref 38–126)
Anion gap: 10 (ref 5–15)
BILIRUBIN TOTAL: 1.8 mg/dL — AB (ref 0.3–1.2)
BUN: 17 mg/dL (ref 6–20)
CHLORIDE: 97 mmol/L — AB (ref 101–111)
CO2: 26 mmol/L (ref 22–32)
CREATININE: 4.99 mg/dL — AB (ref 0.61–1.24)
Calcium: 8.3 mg/dL — ABNORMAL LOW (ref 8.9–10.3)
GFR, EST AFRICAN AMERICAN: 13 mL/min — AB (ref >60)
GFR, EST NON AFRICAN AMERICAN: 11 mL/min — AB (ref >60)
Glucose, Bld: 75 mg/dL (ref 65–99)
Potassium: 4.5 mmol/L (ref 3.5–5.1)
Sodium: 133 mmol/L — ABNORMAL LOW (ref 135–145)
TOTAL PROTEIN: 6.9 g/dL (ref 6.5–8.1)

## 2015-11-28 LAB — CBC WITH DIFFERENTIAL/PLATELET
BASOS ABS: 0 10*3/uL (ref 0.0–0.1)
BLASTS: 0 %
Band Neutrophils: 0 %
Basophils Relative: 2 %
EOS PCT: 0 %
Eosinophils Absolute: 0 10*3/uL (ref 0.0–0.7)
HEMATOCRIT: 33 % — AB (ref 39.0–52.0)
HEMOGLOBIN: 11.2 g/dL — AB (ref 13.0–17.0)
LYMPHS ABS: 0.5 10*3/uL — AB (ref 0.7–4.0)
Lymphocytes Relative: 26 %
MCH: 28.2 pg (ref 26.0–34.0)
MCHC: 33.9 g/dL (ref 30.0–36.0)
MCV: 83.1 fL (ref 78.0–100.0)
METAMYELOCYTES PCT: 0 %
MYELOCYTES: 0 %
Monocytes Absolute: 0.5 10*3/uL (ref 0.1–1.0)
Monocytes Relative: 24 %
NEUTROS PCT: 48 %
NRBC: 0 /100{WBCs}
Neutro Abs: 0.9 10*3/uL — ABNORMAL LOW (ref 1.7–7.7)
Other: 0 %
PROMYELOCYTES ABS: 0 %
Platelets: 82 10*3/uL — ABNORMAL LOW (ref 150–400)
RBC: 3.97 MIL/uL — AB (ref 4.22–5.81)
RDW: 16 % — ABNORMAL HIGH (ref 11.5–15.5)
WBC: 1.9 10*3/uL — AB (ref 4.0–10.5)

## 2015-11-28 LAB — TYPE AND SCREEN
ABO/RH(D): A POS
Antibody Screen: NEGATIVE

## 2015-11-28 LAB — POC OCCULT BLOOD, ED: FECAL OCCULT BLD: POSITIVE — AB

## 2015-11-28 MED ORDER — ONDANSETRON HCL 4 MG PO TABS: 4.0000 mg | ORAL_TABLET | Freq: Four times a day (QID) | ORAL | Status: DC | PRN

## 2015-11-28 MED ORDER — ONDANSETRON HCL 4 MG/2ML IJ SOLN
4.0000 mg | Freq: Four times a day (QID) | INTRAMUSCULAR | Status: DC | PRN
  Administered 2015-11-29: 4 mg via INTRAVENOUS
  Filled 2015-11-28: qty 2

## 2015-11-28 MED ORDER — IOHEXOL 300 MG/ML  SOLN
100.0000 mL | Freq: Once | INTRAMUSCULAR | Status: AC | PRN
  Administered 2015-11-28: 100 mL via INTRAVENOUS

## 2015-11-28 MED ORDER — IOHEXOL 300 MG/ML  SOLN: 25.0000 mL | Freq: Once | INTRAMUSCULAR | Status: DC | PRN

## 2015-11-28 MED ORDER — PANTOPRAZOLE SODIUM 40 MG PO TBEC
40.0000 mg | DELAYED_RELEASE_TABLET | Freq: Two times a day (BID) | ORAL | Status: DC
  Administered 2015-11-29 – 2015-12-02 (×4): 40 mg via ORAL
  Filled 2015-11-28 (×8): qty 1

## 2015-11-28 MED ORDER — SEVELAMER CARBONATE 800 MG PO TABS
2400.0000 mg | ORAL_TABLET | Freq: Three times a day (TID) | ORAL | Status: DC
  Administered 2015-11-29 – 2015-12-01 (×5): 2400 mg via ORAL
  Filled 2015-11-28 (×9): qty 3

## 2015-11-28 MED ORDER — ACETAMINOPHEN 650 MG RE SUPP: 650.0000 mg | Freq: Four times a day (QID) | RECTAL | Status: DC | PRN

## 2015-11-28 MED ORDER — OSELTAMIVIR PHOSPHATE 30 MG PO CAPS
30.0000 mg | ORAL_CAPSULE | Freq: Every day | ORAL | Status: DC
  Administered 2015-11-29: 30 mg via ORAL
  Filled 2015-11-28 (×3): qty 1

## 2015-11-28 MED ORDER — ACETAMINOPHEN 325 MG PO TABS
650.0000 mg | ORAL_TABLET | Freq: Four times a day (QID) | ORAL | Status: DC | PRN
  Administered 2015-11-29: 650 mg via ORAL
  Filled 2015-11-28: qty 2

## 2015-11-28 MED ORDER — B COMPLEX-C PO TABS
1.0000 | ORAL_TABLET | Freq: Every day | ORAL | Status: DC
  Administered 2015-11-29 – 2015-12-03 (×5): 1 via ORAL
  Filled 2015-11-28 (×10): qty 1

## 2015-11-28 MED ORDER — ACETAMINOPHEN 500 MG PO TABS
1000.0000 mg | ORAL_TABLET | Freq: Once | ORAL | Status: AC
  Administered 2015-11-28: 1000 mg via ORAL
  Filled 2015-11-28: qty 2

## 2015-11-28 MED ORDER — OXYCODONE-ACETAMINOPHEN 5-325 MG PO TABS
1.0000 | ORAL_TABLET | Freq: Four times a day (QID) | ORAL | Status: DC | PRN
  Administered 2015-11-29 – 2015-12-02 (×4): 1 via ORAL
  Filled 2015-11-28 (×4): qty 1

## 2015-11-28 MED ORDER — CARVEDILOL 3.125 MG PO TABS
3.1250 mg | ORAL_TABLET | Freq: Two times a day (BID) | ORAL | Status: DC
  Administered 2015-12-01 (×2): 3.125 mg via ORAL
  Filled 2015-11-28 (×7): qty 1

## 2015-11-28 NOTE — ED Notes (Signed)
Pt states abd pain x 3-4 days and noticed he had bright red blood in stool while T DIALYSIS today did finish dialysis has non productive that started this weekend

## 2015-11-28 NOTE — ED Provider Notes (Signed)
CSN: [id number]     Arrival date & time 11/28/15  1344 History   First MD Initiated Contact with Patient 11/28/15 1354     Chief Complaint  Patient presents with  . Abdominal Pain     (Consider location/radiation/quality/duration/timing/severity/associated sxs/prior Treatment) HPI Comments: Patient presents with abdominal pain and rectal bleeding.  He has a history of hypertension, atrial fibrillation (not on anticoagulants due to anemia), PE, end-stage renal disease on dialysis Monday Wednesday Friday. He states that he's had ongoing abdominal pain since January. He states he's been bloated and has had crampy pain throughout his abdomen. He's had a recent CT scan of his abdomen and pelvis last month which was unremarkable. He also had an ultrasound of his abdomen this month which was unremarkable. He's being followed by Dr. Gasper Lloyd with GI.  He states that he has an appointment for an endoscopy and colonoscopy next week. He states that he was diagnosed with functional GI pain. He states today he was at dialysis and had a crampy feeling in his abdomen. He went to the bathroom and had a large bloody bowel movement. He states the toilet was full of blood. He separately had another episode. He states he was very little stool, mostly blood. He also was noted to have a fever in the ED. He notes a four-day history of runny nose congestion and coughing. He has 2 other family members with similar symptoms. He has diminished appetite and nausea but no vomiting. His bowel movements have been normal.  Patient is a 65 y.o. male presenting with abdominal pain.  Abdominal Pain Associated symptoms: cough, fatigue, fever and nausea   Associated symptoms: no chest pain, no chills, no diarrhea, no hematuria, no shortness of breath and no vomiting     Past Medical History  Diagnosis Date  . Anemia   . AVF (arteriovenous fistula) (HCC)     Left  . Secondary hyperparathyroidism (HCC)   . Hypovitaminosis D   .  Hypertensive urgency     H/o  . CHF (congestive heart failure) (HCC)     EF 20-25%  . Exertional shortness of breath     "related to infection in my lungs right now" (05/03/2013)  . History of gout     "before I started doing the dialysis" (05/03/2013)  . Sleep apnea   . GERD (gastroesophageal reflux disease)   . Syncope     felt secondary to residual anesthesia the day before - 2D echo unremarkable  . Pulmonary embolism (HCC)     with right DVT secondary to recent surgery  . Atrial fibrillation (HCC)     not on coumadin due to large spontaneous hematoma on back and anemia  . Myocardial infarction (HCC) 90's  . Coronary artery disease   . Dysrhythmia     afib  . Peripheral vascular disease (HCC)     dvt leg 12/14  . Hypertension   . ESRD (end stage renal disease) on dialysis (HCC)     [facility] mon/wed/fri  . Atrial flutter (HCC) 04/27/2013  . S/P repair of ventral hernia 03/21/2014  . Hereditary and idiopathic peripheral neuropathy 08/29/2015  . Diverticulosis   . Hepatosplenomegaly 10/18/2015    Workup per Dr Kaplan/ GI in Dec 2015 > abd pain improved after paracentesis x 2 (1.2L, 1.3L). Had negative hepA/ hep B/ hep C testing. For hepatosplenomagealy pt underwent transjugular liver biopsy by IR with hepatic vein wedge pressure measurement which showed elevated right heart and free hepatic venous  pressures likely due to right heart failure.  There was no evidence of portal hypertension. Liver bx originally was reported as "End stage liver disease/ cirrhosis", then was corrected > "No evidence of cirrhosis".  The corrected liver biopsy result reads "SINUSOIDAL DILATATION WITH SCATTERED FOCI OF HEPATITIS".  Last GI visit was Aug 2016 for bloating/ abd pain, noted he had a normal gastric empty study.  -Right lobe liver lesion. Felt to be hemangioma, Biopsy pending.  -thrombocytopenia. Dates back to 08/2013.  -ESRD. MWF HD.  -biventricular heart failure, acute right sided heart failure. Daily  HD to address volume overload.    . Acute on chronic systolic right heart failure (HCC) 11/14/2015  . Elevated troponin 11/14/2015   Past Surgical History  Procedure Laterality Date  . Av fistula placement Left     Dr. Charlean Sanfilippo; "I've had 2 on the left' (05/03/2013)  . Av fistula placement Right ~ 2011  . Knee arthroscopy Left   . Avgg removal Right 05/04/2013    Procedure: REMOVAL OF ARTERIOVENOUS Fistula Right Arm;  Surgeon: [name], MD;  Location: [facility] OR;  Service: Vascular;  Laterality: Right;  . Insertion of dialysis catheter Right 05/04/2013    Procedure: INSERTION OF DIALYSIS CATHETER;  Surgeon: [name], MD;  Location: [facility] OR;  Service: Vascular;  Laterality: Right;  . Colonoscopy  10-29-2005    Hx: of  . Bascilic vein transposition Left 06/27/2013    Procedure: BASCILIC VEIN TRANSPOSITION;  Surgeon: [name], MD;  Location: [facility] OR;  Service: Vascular;  Laterality: Left;  . Cardioversion N/A 08/29/2013    Procedure: CARDIOVERSION;  Surgeon: [name], MD;  Location: [facility] ENDOSCOPY;  Service: Cardiovascular;  Laterality: N/A;  . Removal of a dialysis catheter  2/15  . Hernia repair  03/21/14    Umbilical hernia-Dr. Derrell Lolling  . Umbilical hernia repair N/A 03/21/2014    Procedure: LAPAROSCOPIC UMBILICAL HERNIA REPAIR WITH MESH;  Surgeon: Axel Filler, MD;  Location: [facility] OR;  Service: General;  Laterality: N/A;  . Insertion of mesh N/A 03/21/2014    Procedure: INSERTION OF MESH;  Surgeon: Axel Filler, MD;  Location: [facility] OR;  Service: General;  Laterality: N/A;  . Venogram Left 05/26/2013    Procedure: VENOGRAM;  Surgeon: Fransisco Hertz, MD;  Location: [facility] CATH LAB;  Service: Cardiovascular;  Laterality: Left;  . Cardiac catheterization N/A 11/15/2015    Procedure: Right Heart Cath;  Surgeon: Lennette Bihari, MD;  Location: [facility] INVASIVE CV LAB;  Service: Cardiovascular;  Laterality: N/A;  . Cardiac catheterization Right 11/15/2015    Procedure: Left Heart Cath;  Surgeon:  Lennette Bihari, MD;  Location: [facility] INVASIVE CV LAB;  Service: Cardiovascular;  Laterality: Right;   Family History  Problem Relation Age of Onset  . Hypertension Father   . Kidney disease Father   . Allergies Father   . Deep vein thrombosis Sister   . Pulmonary embolism Sister   . Diabetes Paternal Grandmother    Social History  Substance Use Topics  . Smoking status: Former Smoker -- 0.25 packs/day for .5 years    Types: Cigarettes    Quit date: 09/08/1972  . Smokeless tobacco: Never Used  . Alcohol Use: No     Comment: 05/03/2013 "haven't had a glass of wine in ~ 3 months or so; sometimes will have one w/dinner"    Review of Systems  Constitutional: Positive for fever, appetite change and fatigue. Negative for chills and diaphoresis.  HENT: Positive for  congestion and rhinorrhea. Negative for sneezing.   Eyes: Negative.   Respiratory: Positive for cough. Negative for chest tightness and shortness of breath.   Cardiovascular: Negative for chest pain and leg swelling.  Gastrointestinal: Positive for nausea, abdominal pain, blood in stool and abdominal distention. Negative for vomiting and diarrhea.  Genitourinary: Negative for frequency, hematuria, flank pain and difficulty urinating.  Musculoskeletal: Positive for myalgias. Negative for back pain and arthralgias.  Skin: Negative for rash.  Neurological: Negative for dizziness, speech difficulty, weakness, numbness and headaches.      Allergies  Pork-derived products  Home Medications   Prior to Admission medications   Medication Sig Start Date End Date Taking? Authorizing Provider  aspirin EC 81 MG EC tablet Take 1 tablet (81 mg total) by mouth daily. 11/17/15  Yes Rodolph Bong, MD  B Complex-C (B-COMPLEX WITH VITAMIN C) tablet Take 1 tablet by mouth daily.   Yes Historical Provider, MD  labetalol (NORMODYNE) 200 MG tablet Take 200 mg by mouth 2 (two) times daily.   Yes Historical Provider, MD  Nutritional Supplements  (FEEDING SUPPLEMENT, NEPRO CARB STEADY,) LIQD Take 237 mLs by mouth 2 (two) times daily between meals. 10/19/15  Yes Nishant Dhungel, MD  oxyCODONE-acetaminophen (PERCOCET/ROXICET) 5-325 MG tablet Take 1 tablet by mouth every 6 (six) hours as needed. Pain 10/29/15  Yes Historical Provider, MD  pantoprazole (PROTONIX) 40 MG tablet Take 1 tablet (40 mg total) by mouth 2 (two) times daily before a meal. 11/17/15  Yes Rodolph Bong, MD  sevelamer carbonate (RENVELA) 800 MG tablet Take 4 tablets (3,200 mg total) by mouth 3 (three) times daily with meals. Patient taking differently: Take 2,400 mg by mouth 3 (three) times daily with meals.  09/12/14  Yes Tora Kindred York, PA-C  Alum & Mag Hydroxide-Simeth (GI COCKTAIL) SUSP suspension Take 30 mLs by mouth 3 (three) times daily as needed for indigestion. Shake well. 11/17/15   Rodolph Bong, MD  carvedilol (COREG) 3.125 MG tablet Take 1 tablet (3.125 mg total) by mouth 2 (two) times daily with a meal. 11/17/15   Rodolph Bong, MD  nitroGLYCERIN (NITROSTAT) 0.4 MG SL tablet Place 1 tablet (0.4 mg total) under the tongue every 5 (five) minutes as needed for chest pain. 11/17/15   Rodolph Bong, MD   BP 110/82 mmHg  Pulse 73  Temp(Src) 102.2 F (39 C) (Oral)  Resp 24  Wt 213 lb 6.5 oz (96.8 kg)  SpO2 96% Physical Exam  Constitutional: He is oriented to person, place, and time. He appears well-developed and well-nourished.  HENT:  Head: Normocephalic and atraumatic.  Eyes: Pupils are equal, round, and reactive to light.  Neck: Normal range of motion. Neck supple.  Cardiovascular: Normal rate, regular rhythm and normal heart sounds.   Pulmonary/Chest: Effort normal and breath sounds normal. No respiratory distress. He has no wheezes. He has no rales. He exhibits no tenderness.  Abdominal: Soft. Bowel sounds are normal. He exhibits distension. There is tenderness (moderate diffuse tenderness). There is no rebound and no guarding.  Genitourinary:   Gross blood on rectal exam, small amount.  No visible source for bleeding.  No stool  Musculoskeletal: Normal range of motion. He exhibits no edema.  Lymphadenopathy:    He has no cervical adenopathy.  Neurological: He is alert and oriented to person, place, and time.  Skin: Skin is warm and dry. No rash noted.  Psychiatric: He has a normal mood and affect.    ED Course  Procedures (including critical care time) Labs Review Labs Reviewed  COMPREHENSIVE METABOLIC PANEL - Abnormal; Notable for the following:    Sodium 133 (*)    Chloride 97 (*)    Creatinine, Ser 4.99 (*)    Calcium 8.3 (*)    Albumin 2.9 (*)    AST 58 (*)    Alkaline Phosphatase 141 (*)    Total Bilirubin 1.8 (*)    GFR calc non Af Amer 11 (*)    GFR calc Af Amer 13 (*)    All other components within normal limits  CBC WITH DIFFERENTIAL/PLATELET - Abnormal; Notable for the following:    WBC 1.9 (*)    RBC 3.97 (*)    Hemoglobin 11.2 (*)    HCT 33.0 (*)    RDW 16.0 (*)    Platelets 82 (*)    All other components within normal limits  POC OCCULT BLOOD, ED - Abnormal; Notable for the following:    Fecal Occult Bld POSITIVE (*)    All other components within normal limits  URINALYSIS, ROUTINE W REFLEX MICROSCOPIC (NOT AT [facility])  INFLUENZA PANEL BY PCR (TYPE A & B, H1N1)  TYPE AND SCREEN    Imaging Review Dg Chest 2 View  11/28/2015  CLINICAL DATA:  Cough, fever, congestion, and sore throat for 3 days. EXAM: CHEST  2 VIEW COMPARISON:  Chest CTA 11/14/2015 and radiographs 11/13/2015 FINDINGS: The cardiac silhouette remains moderately enlarged. A tortuous, calcified thoracic aorta is again seen. Pulmonary vascular congestion and perihilar and bibasilar opacities are similar to the prior radiographs. No pleural effusion or pneumothorax is identified. No acute osseous abnormality is identified. IMPRESSION: Cardiomegaly with pulmonary vascular congestion and mild edema, unchanged from prior radiographs.  Electronically Signed   By: Sebastian Ache M.D.   On: 11/28/2015 16:03   I have personally reviewed and evaluated these images and lab results as part of my medical decision-making.   EKG Interpretation None      MDM   Final diagnoses:  Rectal bleeding  Influenza-like illness    Patient presents with rectal bleeding. He's had a several month history of abdominal pain and bloating but his had a fairly extensive workup over the last few months. He's had a recent CT scan in February. At this point I don't feel that we need to repeat it. His blood pressures been stable in the ED. He does have a fever of 102 and flulike symptoms. He has no evidence of pneumonia. His hemoglobin is stable. He has pancytopenia which she's had in the past. I spoke with Dr. Jomarie Longs with the [facility] hospitalist service to admit the patient, full admission to telemetry.    Rolan Bucco, MD 11/28/15 [phone]

## 2015-11-28 NOTE — Progress Notes (Signed)
Patient trasfered from ED to 5W11 via stretcher; alert and oriented x 4; IV in RFA NSL. Tele box 16 was placed and CCMD notified. Droplet precaution initiated and FLU PCR was send to the lab. Orient patient to room and unit; gave patient care guide; instructed how to use the call bell and fall risk precautions. Will continue to monitor the patient.

## 2015-11-28 NOTE — H&P (Addendum)
Patient Demographics:    Trevor [name], is a 65 y.o. male  MRN: [id number]   DOB - March 07, 1951  Admit Date - 11/28/2015  Outpatient Primary MD for the patient is [name], MD Referring MD: Gerrit Halls Outpatient Specialists: Brooke Dare Dr.Nandigam, cards-  Dr.[name]  Patient coming from: home  Chief Complaint  Patient presents with  . Abdominal Pain      HPI:    Trevor [name]  is a 65 y.o. male, with PMH of end-stage renal disease on HD (MWF), biventricular heart failure, EF 25%, A. 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 He has been having issues with abdominal pain for over 2 months now, had multiple unremarkable scans, is being worked up by [facility] with a plan for endoscopy and colonoscopy in a week. Today he was at dialysis and started having cramping abdominal pain which is not unusual for him subsequently went to the bathroom and had a large bloody bowel movement, it was bright red associated with crampy abdominal pain. He had completed dialysis at that point and went home on his way home he had to stop to use the bathroom and subsequently had another bloody bowel movement, and the third one while in the emergency room. In addition he's also been having cough congestion, runny nose for the last 3-4 days, with sick contacts and his family, was found to have a fever of 102 in the emergency room, patient does not recall if he had fevers at home in the past few days. In the emergency room labs remarkable for pancytopenia, hemoglobin stable at 11.2    Review of systems:    In addition to the HPI above, positives bolded Positive Fever-chills, No Headache, No changes with Vision or hearing, No problems swallowing  food or Liquids, No Chest pain, Cough or Shortness of Breath,  Abdominal pain, No Nausea or Vommitting, Bowel movements are regular,  Blood in stool or Urine, No dysuria, No new skin rashes or bruises, No new joints pains-aches,  No new weakness, tingling, numbness in any extremity, No recent weight gain or loss, No polyuria, polydypsia or polyphagia, No significant Mental Stressors.  A full 10 point Review of Systems was done, except as stated above, all other Review of Systems were negative.   Past Medical History  Diagnosis Date  . Anemia   . AVF (arteriovenous fistula) (HCC)     Left  . Secondary hyperparathyroidism (HCC)   . Hypovitaminosis D   . Hypertensive urgency     H/o  . CHF (congestive heart failure) (HCC)     EF 20-25%  . Exertional shortness of breath     "related to infection in my lungs right now" (05/03/2013)  . History of gout     "before I started doing the dialysis" (05/03/2013)  . Sleep apnea   . GERD (gastroesophageal reflux disease)   . Syncope     felt secondary to residual anesthesia the day before - 2D echo unremarkable  . Pulmonary embolism (HCC)     with right DVT secondary to recent surgery  . Atrial fibrillation (HCC)     not on coumadin due to large spontaneous hematoma on back and anemia  . Myocardial infarction (HCC) 90's  . Coronary artery disease   . Dysrhythmia     afib  .  Peripheral vascular disease (HCC)     dvt leg 12/14  . Hypertension   . ESRD (end stage renal disease) on dialysis (HCC)     [facility] mon/wed/fri  . Atrial flutter (HCC) 04/27/2013  . S/P repair of ventral hernia 03/21/2014  . Hereditary and idiopathic peripheral neuropathy 08/29/2015  . Diverticulosis   . Hepatosplenomegaly 10/18/2015    Workup per Dr Kaplan/ GI in Dec 2015 > abd pain improved after paracentesis x 2 (1.2L, 1.3L). Had negative hepA/ hep B/ hep C testing. For hepatosplenomagealy pt underwent transjugular liver biopsy by IR with hepatic vein wedge  pressure measurement which showed elevated right heart and free hepatic venous pressures likely due to right heart failure.  There was no evidence of portal hypertension. Liver bx originally was reported as "End stage liver disease/ cirrhosis", then was corrected > "No evidence of cirrhosis".  The corrected liver biopsy result reads "SINUSOIDAL DILATATION WITH SCATTERED FOCI OF HEPATITIS".  Last GI visit was Aug 2016 for bloating/ abd pain, noted he had a normal gastric empty study.  -Right lobe liver lesion. Felt to be hemangioma, Biopsy pending.  -thrombocytopenia. Dates back to 08/2013.  -ESRD. MWF HD.  -biventricular heart failure, acute right sided heart failure. Daily HD to address volume overload.    . Acute on chronic systolic right heart failure (HCC) 11/14/2015  . Elevated troponin 11/14/2015      Past Surgical History  Procedure Laterality Date  . Av fistula placement Left     Dr. Charlean Sanfilippo; "I've had 2 on the left' (05/03/2013)  . Av fistula placement Right ~ 2011  . Knee arthroscopy Left   . Avgg removal Right 05/04/2013    Procedure: REMOVAL OF ARTERIOVENOUS Fistula Right Arm;  Surgeon: [name], MD;  Location: [facility] OR;  Service: Vascular;  Laterality: Right;  . Insertion of dialysis catheter Right 05/04/2013    Procedure: INSERTION OF DIALYSIS CATHETER;  Surgeon: [name], MD;  Location: [facility] OR;  Service: Vascular;  Laterality: Right;  . Colonoscopy  10-29-2005    Hx: of  . Bascilic vein transposition Left 06/27/2013    Procedure: BASCILIC VEIN TRANSPOSITION;  Surgeon: [name], MD;  Location: [facility] OR;  Service: Vascular;  Laterality: Left;  . Cardioversion N/A 08/29/2013    Procedure: CARDIOVERSION;  Surgeon: [name], MD;  Location: [facility] ENDOSCOPY;  Service: Cardiovascular;  Laterality: N/A;  . Removal of a dialysis catheter  2/15  . Hernia repair  03/21/14    Umbilical hernia-Dr. Derrell Lolling  . Umbilical hernia repair N/A 03/21/2014    Procedure: LAPAROSCOPIC  UMBILICAL HERNIA REPAIR WITH MESH;  Surgeon: Axel Filler, MD;  Location: [facility] OR;  Service: General;  Laterality: N/A;  . Insertion of mesh N/A 03/21/2014    Procedure: INSERTION OF MESH;  Surgeon: Axel Filler, MD;  Location: [facility] OR;  Service: General;  Laterality: N/A;  . Venogram Left 05/26/2013    Procedure: VENOGRAM;  Surgeon: Fransisco Hertz, MD;  Location: [facility] CATH LAB;  Service: Cardiovascular;  Laterality: Left;  . Cardiac catheterization N/A 11/15/2015    Procedure: Right Heart Cath;  Surgeon: Lennette Bihari, MD;  Location: [facility] INVASIVE CV LAB;  Service: Cardiovascular;  Laterality: N/A;  . Cardiac catheterization Right 11/15/2015    Procedure: Left Heart Cath;  Surgeon: Lennette Bihari, MD;  Location: [facility] INVASIVE CV LAB;  Service: Cardiovascular;  Laterality: Right;      Social History:     Social History  Substance Use  Topics  . Smoking status: Former Smoker -- 0.25 packs/day for .5 years    Types: Cigarettes    Quit date: 09/08/1972  . Smokeless tobacco: Never Used  . Alcohol Use: No     Comment: 05/03/2013 "haven't had a glass of wine in ~ 3 months or so; sometimes will have one w/dinner"     Lives - at home with family  Mobility - independent    Family History :     Family History  Problem Relation Age of Onset  . Hypertension Father   . Kidney disease Father   . Allergies Father   . Deep vein thrombosis Sister   . Pulmonary embolism Sister   . Diabetes Paternal Grandmother      Home Medications:   Prior to Admission medications   Medication Sig Start Date End Date Taking? Authorizing Provider  aspirin EC 81 MG EC tablet Take 1 tablet (81 mg total) by mouth daily. 11/17/15  Yes Rodolph Bong, MD  B Complex-C (B-COMPLEX WITH VITAMIN C) tablet Take 1 tablet by mouth daily.   Yes Historical Provider, MD  labetalol (NORMODYNE) 200 MG tablet Take 200 mg by mouth 2 (two) times daily.   Yes Historical Provider, MD  Nutritional Supplements (FEEDING SUPPLEMENT, NEPRO  CARB STEADY,) LIQD Take 237 mLs by mouth 2 (two) times daily between meals. 10/19/15  Yes Nishant Dhungel, MD  oxyCODONE-acetaminophen (PERCOCET/ROXICET) 5-325 MG tablet Take 1 tablet by mouth every 6 (six) hours as needed. Pain 10/29/15  Yes Historical Provider, MD  pantoprazole (PROTONIX) 40 MG tablet Take 1 tablet (40 mg total) by mouth 2 (two) times daily before a meal. 11/17/15  Yes Rodolph Bong, MD  sevelamer carbonate (RENVELA) 800 MG tablet Take 4 tablets (3,200 mg total) by mouth 3 (three) times daily with meals. Patient taking differently: Take 2,400 mg by mouth 3 (three) times daily with meals.  09/12/14  Yes Tora Kindred York, PA-C  Alum & Mag Hydroxide-Simeth (GI COCKTAIL) SUSP suspension Take 30 mLs by mouth 3 (three) times daily as needed for indigestion. Shake well. 11/17/15   Rodolph Bong, MD  carvedilol (COREG) 3.125 MG tablet Take 1 tablet (3.125 mg total) by mouth 2 (two) times daily with a meal. 11/17/15   Rodolph Bong, MD  nitroGLYCERIN (NITROSTAT) 0.4 MG SL tablet Place 1 tablet (0.4 mg total) under the tongue every 5 (five) minutes as needed for chest pain. 11/17/15   Rodolph Bong, MD     Allergies:     Allergies  Allergen Reactions  . Pork-Derived Products Nausea And Vomiting    Culture     Physical Exam:   Vitals  Blood pressure 110/81, pulse 87, temperature 102.2 F (39 C), temperature source Oral, resp. rate 24, weight 96.8 kg (213 lb 6.5 oz), SpO2 96 %.   1. General alert awake lying in bed, uncomfortable appearing, oriented 3  2. Normal affect and insight   3. No F.N deficits, ALL C.Nerves Intact, Strength 5/5 all 4 extremities, Sensation intact all 4 extremities, Plantars down going.  4. Ears and Eyes appear Normal, Conjunctivae clear, PERRLA. Moist Oral Mucosa.  5. Supple Neck, No JVD, No cervical lymphadenopathy appriciated, No Carotid Bruits.  6. Symmetrical Chest wall movement, Good air movement bilaterally, CTAB.  7. RRR, No  Gallops, Rubs or Murmurs, No Parasternal Heave.  8. Positive Bowel Sounds, Abdomen Soft, mild periumbilical tenderness , hepatosplenomegaly noted , bowel sounds increased   9.  No Cyanosis, Normal Skin  Turgor, hypopigmented areas noted on the skin  10. Good muscle tone,  joints appear normal , no effusions, Normal ROM. 1+ edema in lower extremities  11. No Palpable Lymph Nodes in Neck or Axillae     Data Review:    CBC  Recent Labs Lab 11/28/15 1507  WBC 1.9*  HGB 11.2*  HCT 33.0*  PLT 82*  MCV 83.1  MCH 28.2  MCHC 33.9  RDW 16.0*  LYMPHSABS 0.5*  MONOABS 0.5  EOSABS 0.0  BASOSABS 0.0   ------------------------------------------------------------------------------------------------------------------  Chemistries   Recent Labs Lab 11/28/15 1507  NA 133*  K 4.5  CL 97*  CO2 26  GLUCOSE 75  BUN 17  CREATININE 4.99*  CALCIUM 8.3*  AST 58*  ALT 17  ALKPHOS 141*  BILITOT 1.8*   ------------------------------------------------------------------------------------------------------------------ estimated creatinine clearance is 18.1 mL/min (by C-G formula based on Cr of 4.99). ------------------------------------------------------------------------------------------------------------------ No results for input(s): TSH, T4TOTAL, T3FREE, THYROIDAB in the last 72 hours.  Invalid input(s): FREET3   Coagulation profile No results for input(s): INR, PROTIME in the last 168 hours. ------------------------------------------------------------------------------------------------------------------- No results for input(s): DDIMER in the last 72 hours. -------------------------------------------------------------------------------------------------------------------  Cardiac Enzymes No results for input(s): CKMB, TROPONINI, MYOGLOBIN in the last 168 hours.  Invalid input(s):  CK ------------------------------------------------------------------------------------------------------------------    Component Value Date/Time   BNP 1959.9* 08/30/2014 0402     ---------------------------------------------------------------------------------------------------------------  Urinalysis    Component Value Date/Time   COLORURINE YELLOW 05/03/2013 0603   APPEARANCEUR CLEAR 05/03/2013 0603   LABSPEC 1.012 05/03/2013 0603   PHURINE 8.5* 05/03/2013 0603   GLUCOSEU NEGATIVE 05/03/2013 0603   HGBUR SMALL* 05/03/2013 0603   BILIRUBINUR NEGATIVE 05/03/2013 0603   KETONESUR NEGATIVE 05/03/2013 0603   PROTEINUR >300* 05/03/2013 0603   UROBILINOGEN 0.2 05/03/2013 0603   NITRITE NEGATIVE 05/03/2013 0603   LEUKOCYTESUR NEGATIVE 05/03/2013 0603    ----------------------------------------------------------------------------------------------------------------   Imaging Results:    Dg Chest 2 View  11/28/2015  CLINICAL DATA:  Cough, fever, congestion, and sore throat for 3 days. EXAM: CHEST  2 VIEW COMPARISON:  Chest CTA 11/14/2015 and radiographs 11/13/2015 FINDINGS: The cardiac silhouette remains moderately enlarged. A tortuous, calcified thoracic aorta is again seen. Pulmonary vascular congestion and perihilar and bibasilar opacities are similar to the prior radiographs. No pleural effusion or pneumothorax is identified. No acute osseous abnormality is identified. IMPRESSION: Cardiomegaly with pulmonary vascular congestion and mild edema, unchanged from prior radiographs. Electronically Signed   By: Sebastian Ache M.D.   On: 11/28/2015 16:03     Assessment & Plan:    Active Problems:  Lower GI bleed/chronic abdominal pain -In the setting of hemodialysis, abdominal pain and lower GI bleed need to rule out ischemic colitis -check CT abdomen pelvis with contrast -Hold aspirin -[facility] consulted by EDP, endoscopic workup was being planned prior to this admission -hb stable  at this time, monitor periodically, suspect it hasnt equilibrated yet -colonoscopy in 2007 with diverticulosis  Fever/URI/FLu like symptoms -CXR clear -check FLu PCR will start empiric Tamiflu even though symptoms started >48hours ago -FU Blood Cx    ESRD on hemodialysis (HCC) -Will notify renal of admission     DCM- EF 25-30% by echo 09/04/14 -Volume managed with dialysis, resume Coreg -hold labetalol    Abdominal pain, chronic, epigastric -As above -last admission,  had an Abdominal ultrasound with nodular hepatic contour and splenomegaly with findings consistent with chronic liver disease.  CT abdomen and pelvis on 10/14/2015 with chronic granulomatous disease with adenopathy and splenomegaly and  volume overload. Patient had a prior liver biopsy December 2015 which showed sinusoidal dilatation and scattered foci hepatitis and negative for cirrhosis then    Chronic atrial fibrillation (HCC) -CHADS2VASC is 3  -continue coreg, rate controlled. Patient has refused anticoagulation in the past secondary to prior history of Spontaneous back hematoma and GI bleed per previous documentation and not appropriate at this time anyway    Pancytopenia -likely due to hepatosplenomegaly and splenic sequestration, worsened due to URI/viral illness   history of DVT/PE -remote, not in our system from 2014 onwards -he was previously on Coumadin. Discontinued secondary to spontaneous back hematoma. Patient has refused anticoagulation since  DVT Prophylaxis- SCDs  Family Communication: no family at bedside, plan d/w pt  Code Status Full Code  Likely DC to  Home when stable  Consults called: [facility] called by EDP    Admission status: inpatient    Time spent in minutes :   Kienan Doublin M.D on 11/28/2015 at 5:15 PM  Between 7am to 7pm - Pager - [phone]  After 7pm go to [email or web address] - password [facility]  [facility] - Office  [phone]

## 2015-11-29 ENCOUNTER — Encounter (HOSPITAL_COMMUNITY): Payer: Self-pay | Admitting: General Practice

## 2015-11-29 DIAGNOSIS — Z992 Dependence on renal dialysis: Secondary | ICD-10-CM

## 2015-11-29 DIAGNOSIS — R1013 Epigastric pain: Secondary | ICD-10-CM

## 2015-11-29 DIAGNOSIS — N186 End stage renal disease: Secondary | ICD-10-CM

## 2015-11-29 DIAGNOSIS — K921 Melena: Principal | ICD-10-CM

## 2015-11-29 LAB — BASIC METABOLIC PANEL
ANION GAP: 14 (ref 5–15)
BUN: 25 mg/dL — ABNORMAL HIGH (ref 6–20)
CHLORIDE: 95 mmol/L — AB (ref 101–111)
CO2: 25 mmol/L (ref 22–32)
CREATININE: 6.15 mg/dL — AB (ref 0.61–1.24)
Calcium: 8.7 mg/dL — ABNORMAL LOW (ref 8.9–10.3)
GFR calc non Af Amer: 9 mL/min — ABNORMAL LOW (ref >60)
GFR, EST AFRICAN AMERICAN: 10 mL/min — AB (ref >60)
Glucose, Bld: 52 mg/dL — ABNORMAL LOW (ref 65–99)
Potassium: 4.3 mmol/L (ref 3.5–5.1)
SODIUM: 134 mmol/L — AB (ref 135–145)

## 2015-11-29 LAB — CBC
HCT: 33.9 % — ABNORMAL LOW (ref 39.0–52.0)
Hemoglobin: 11.2 g/dL — ABNORMAL LOW (ref 13.0–17.0)
MCH: 27.7 pg (ref 26.0–34.0)
MCHC: 33 g/dL (ref 30.0–36.0)
MCV: 83.9 fL (ref 78.0–100.0)
PLATELETS: 72 10*3/uL — AB (ref 150–400)
RBC: 4.04 MIL/uL — AB (ref 4.22–5.81)
RDW: 16 % — AB (ref 11.5–15.5)
WBC: 2 10*3/uL — AB (ref 4.0–10.5)

## 2015-11-29 LAB — GLUCOSE, CAPILLARY
GLUCOSE-CAPILLARY: 38 mg/dL — AB (ref 65–99)
GLUCOSE-CAPILLARY: 47 mg/dL — AB (ref 65–99)
GLUCOSE-CAPILLARY: 99 mg/dL (ref 65–99)

## 2015-11-29 LAB — PATHOLOGIST SMEAR REVIEW

## 2015-11-29 MED ORDER — MORPHINE SULFATE (PF) 2 MG/ML IV SOLN
2.0000 mg | INTRAVENOUS | Status: DC | PRN
  Administered 2015-11-29 – 2015-12-02 (×4): 2 mg via INTRAVENOUS
  Filled 2015-11-29 (×4): qty 1

## 2015-11-29 MED ORDER — DEXTROSE 50 % IV SOLN
INTRAVENOUS | Status: AC
  Administered 2015-11-29: 25 mL via INTRAVENOUS
  Filled 2015-11-29: qty 50

## 2015-11-29 MED ORDER — PEG-KCL-NACL-NASULF-NA ASC-C 100 G PO SOLR
0.5000 | Freq: Once | ORAL | Status: DC
  Filled 2015-11-29: qty 1

## 2015-11-29 MED ORDER — OSELTAMIVIR PHOSPHATE 30 MG PO CAPS
30.0000 mg | ORAL_CAPSULE | ORAL | Status: AC
  Administered 2015-11-30 – 2015-12-03 (×2): 30 mg via ORAL
  Filled 2015-11-29 (×3): qty 1

## 2015-11-29 MED ORDER — MORPHINE SULFATE (PF) 2 MG/ML IV SOLN: 1.0000 mg | INTRAVENOUS | Status: DC | PRN

## 2015-11-29 MED ORDER — LABETALOL HCL 200 MG PO TABS
200.0000 mg | ORAL_TABLET | Freq: Two times a day (BID) | ORAL | Status: DC
  Administered 2015-11-29 – 2015-12-01 (×2): 200 mg via ORAL
  Filled 2015-11-29 (×5): qty 1

## 2015-11-29 MED ORDER — PEG-KCL-NACL-NASULF-NA ASC-C 100 G PO SOLR: 1.0000 | Freq: Once | ORAL | Status: DC

## 2015-11-29 MED ORDER — PEG-KCL-NACL-NASULF-NA ASC-C 100 G PO SOLR
0.5000 | Freq: Once | ORAL | Status: AC
  Administered 2015-11-30: 100 g via ORAL
  Filled 2015-11-29: qty 1

## 2015-11-29 NOTE — Consult Note (Signed)
                                                                           [facility] Consult: 8:50 AM 11/29/2015  LOS: 1 day    Referring Provider: Dr Joseph.   Primary Care Physician:  SANDERS,ROBYN N, MD Primary Gastroenterologist:  Dr. Nandigam.  Previously Dr Kaplan     Reason for Consultation:  hematochezia   HPI: Trevor [name] is a 65 y.o. male.  ESRD. On HD MWF. Biventricular heart failure.  Thrombocytopenia. 10/2013 CT: Splenomegaly, reflux contrast into hepatic veins, c/w right heart dysfunction. Ascites. S/p Paracentesis 09/02/2014: no SBP. Liver biopsy 08/2014: sinusoidal dilatation, scattered foci hepatitis. At one point was thought he had cirrhosis, but work up proved this incorrect. Constipation/bloating, treated with Amitiza in past (no longer takes as constipation not an issue). S/p 01/2014 laparoscopic umbilical hernia repair. A fib/flutter, not on AC due to anemia and hx large spontaneous hematoma on back. S/p cardioversion 08/2013. CAD. Hx chronic abdominal bloating and intermittent upper abdominal pain since 01/2014 umbilical hernia surgery.  Relieved with Morphine. 10/2005 Colonoscopy.  For IDA.   Diverticulosis. OSA. 2015 Liver biopsy.  Path: Sinusoidal dilatation, scattered foci hepatitis Did not end up having a dx of cirrhosis.  09/03/15 Paracentesis: no SBP.  10/14/15 CT abdomen/pelvis: " Chronic granulomatous disease with adenopathy and splenomegaly", volume overload. Bulky adenopathy in the hepatic hilum with faint alcifications, stable since 2015. Portacaval node measures 33 mm in short axis. Small ascites in the pelvis, likely from volume overload. Large appearance of the liver without acute finding. Stable 14 mm subcapsular nodule in segment 6. Hazy high-density layering in the gallbladder, likely sludge. No calcified gallstone. ericholecystic  edema is mild and in keeping with diffuse etroperitoneal edema a considered reactive (could be secondary to atient's volume overload, hepatitis-C, or right heart dysfunction). Note pt is Hep B/C/A non-reactive.  Admission 2/5 - 10/19/15.  Dr Nandigam saw pt for abdominal pain but felt sxs functional and did not pursue egd or colonoscopy as pt had desired.  Not slight elevation of T bili to 3.1, AST 54, alk phos 227,  in 09/09/15, to 1.4-1.6, normal AST/ALT and alk phos 150s  in early 11/2015.    Is now set up for 12/04/2015 screening colonoscopy and EGD   Had first episode of hematochezia on Monday 3/20: large volume, smaller volumes on Tuesday and Wednesday.  No change in abdominal pain.  Persistent poor appetite but po does not increase pain. No N/V.  Hgb stable at 11.2, within historic normal range.  Platelets in 70s, 80s: also in normal range for him.  PT up to 17.2, INR 1.3.  Alk phos 141, AST/alt 58/17, t bili 1.8.  11/28/15 CT scan: Passive hepatic congestion from heart failure.  HSM.  Reactive abdominal lymph nodes likely related to liver disease. Moderate diffuse abdominal and pelvic free fluid consistent with ascites  Past Medical History  Diagnosis Date  . Anemia   . AVF (arteriovenous fistula) (HCC)     Left  . Secondary hyperparathyroidism (HCC)   . Hypovitaminosis D   . Hypertensive urgency     H/o  . CHF (congestive heart failure) (HCC)       EF 20-25%  . Exertional shortness of breath     "related to infection in my lungs right now" (05/03/2013)  . History of gout     "before I started doing the dialysis" (05/03/2013)  . Sleep apnea   . GERD (gastroesophageal reflux disease)   . Syncope     felt secondary to residual anesthesia the day before - 2D echo unremarkable  . Pulmonary embolism (HCC)     with right DVT secondary to recent surgery  . Atrial fibrillation (HCC)     not on coumadin due to large spontaneous hematoma on back and anemia  . Myocardial infarction (HCC) 90's    . Coronary artery disease   . Dysrhythmia     afib  . Peripheral vascular disease (HCC)     dvt leg 12/14  . Hypertension   . ESRD (end stage renal disease) on dialysis (HCC)     [facility] mon/wed/fri  . Atrial flutter (HCC) 04/27/2013  . S/P repair of ventral hernia 03/21/2014  . Hereditary and idiopathic peripheral neuropathy 08/29/2015  . Diverticulosis   . Hepatosplenomegaly 10/18/2015    Workup per Dr Kaplan/ GI in Dec 2015 > abd pain improved after paracentesis x 2 (1.2L, 1.3L). Had negative hepA/ hep B/ hep C testing. For hepatosplenomagealy pt underwent transjugular liver biopsy by IR with hepatic vein wedge pressure measurement which showed elevated right heart and free hepatic venous pressures likely due to right heart failure.  There was no evidence of portal hypertension. Liver bx originally was reported as "End stage liver disease/ cirrhosis", then was corrected > "No evidence of cirrhosis".  The corrected liver biopsy result reads "SINUSOIDAL DILATATION WITH SCATTERED FOCI OF HEPATITIS".  Last GI visit was Aug 2016 for bloating/ abd pain, noted he had a normal gastric empty study.  -Right lobe liver lesion. Felt to be hemangioma, Biopsy pending.  -thrombocytopenia. Dates back to 08/2013.  -ESRD. MWF HD.  -biventricular heart failure, acute right sided heart failure. Daily HD to address volume overload.    . Acute on chronic systolic right heart failure (HCC) 11/14/2015  . Elevated troponin 11/14/2015  . Renal insufficiency     Past Surgical History  Procedure Laterality Date  . Av fistula placement Left     Dr. Feilds; "I've had 2 on the left' (05/03/2013)  . Av fistula placement Right ~ 2011  . Knee arthroscopy Left   . Avgg removal Right 05/04/2013    Procedure: REMOVAL OF ARTERIOVENOUS Fistula Right Arm;  Surgeon: Charles E Fields, MD;  Location: [facility] OR;  Service: Vascular;  Laterality: Right;  . Insertion of dialysis catheter Right 05/04/2013    Procedure: INSERTION OF DIALYSIS  CATHETER;  Surgeon: Charles E Fields, MD;  Location: [facility] OR;  Service: Vascular;  Laterality: Right;  . Colonoscopy  10-29-2005    Hx: of  . Bascilic vein transposition Left 06/27/2013    Procedure: BASCILIC VEIN TRANSPOSITION;  Surgeon: Charles E Fields, MD;  Location: [facility] OR;  Service: Vascular;  Laterality: Left;  . Cardioversion N/A 08/29/2013    Procedure: CARDIOVERSION;  Surgeon: Traci R [name], MD;  Location: [facility] ENDOSCOPY;  Service: Cardiovascular;  Laterality: N/A;  . Removal of a dialysis catheter  2/15  . Hernia repair  03/21/14    Umbilical hernia-Dr. Ramirez  . Umbilical hernia repair N/A 03/21/2014    Procedure: LAPAROSCOPIC UMBILICAL HERNIA REPAIR WITH MESH;  Surgeon: Armando Ramirez, MD;  Location: [facility] OR;  Service: General;  Laterality: N/A;  . Insertion   of mesh N/A 03/21/2014    Procedure: INSERTION OF MESH;  Surgeon: Armando Ramirez, MD;  Location: [facility] OR;  Service: General;  Laterality: N/A;  . Venogram Left 05/26/2013    Procedure: VENOGRAM;  Surgeon: Brian L Chen, MD;  Location: [facility] CATH LAB;  Service: Cardiovascular;  Laterality: Left;  . Cardiac catheterization N/A 11/15/2015    Procedure: Right Heart Cath;  Surgeon: Thomas A Kelly, MD;  Location: [facility] INVASIVE CV LAB;  Service: Cardiovascular;  Laterality: N/A;  . Cardiac catheterization Right 11/15/2015    Procedure: Left Heart Cath;  Surgeon: Thomas A Kelly, MD;  Location: [facility] INVASIVE CV LAB;  Service: Cardiovascular;  Laterality: Right;    Prior to Admission medications   Medication Sig Start Date End Date Taking? Authorizing Provider  aspirin EC 81 MG EC tablet Take 1 tablet (81 mg total) by mouth daily. 11/17/15  Yes Daniel Thompson V, MD  B Complex-C (B-COMPLEX WITH VITAMIN C) tablet Take 1 tablet by mouth daily.   Yes Historical Provider, MD  labetalol (NORMODYNE) 200 MG tablet Take 200 mg by mouth 2 (two) times daily.   Yes Historical Provider, MD  Nutritional Supplements (FEEDING SUPPLEMENT, NEPRO CARB STEADY,) LIQD Take  237 mLs by mouth 2 (two) times daily between meals. 10/19/15  Yes Nishant Dhungel, MD  oxyCODONE-acetaminophen (PERCOCET/ROXICET) 5-325 MG tablet Take 1 tablet by mouth every 6 (six) hours as needed. Pain 10/29/15  Yes Historical Provider, MD  pantoprazole (PROTONIX) 40 MG tablet Take 1 tablet (40 mg total) by mouth 2 (two) times daily before a meal. 11/17/15  Yes Daniel Thompson V, MD  sevelamer carbonate (RENVELA) 800 MG tablet Take 4 tablets (3,200 mg total) by mouth 3 (three) times daily with meals. Patient taking differently: Take 2,400 mg by mouth 3 (three) times daily with meals.  09/12/14  Yes Marianne L York, PA-C  Alum & Mag Hydroxide-Simeth (GI COCKTAIL) SUSP suspension Take 30 mLs by mouth 3 (three) times daily as needed for indigestion. Shake well. 11/17/15   Daniel Thompson V, MD  carvedilol (COREG) 3.125 MG tablet Take 1 tablet (3.125 mg total) by mouth 2 (two) times daily with a meal. 11/17/15   Daniel Thompson V, MD  nitroGLYCERIN (NITROSTAT) 0.4 MG SL tablet Place 1 tablet (0.4 mg total) under the tongue every 5 (five) minutes as needed for chest pain. 11/17/15   Daniel Thompson V, MD    Scheduled Meds: . B-complex with vitamin C  1 tablet Oral Daily  . carvedilol  3.125 mg Oral BID WC  . oseltamivir  30 mg Oral Daily  . pantoprazole  40 mg Oral BID AC  . sevelamer carbonate  2,400 mg Oral TID WC   Infusions:   PRN Meds: acetaminophen **OR** acetaminophen, iohexol, iohexol, ondansetron **OR** ondansetron (ZOFRAN) IV, oxyCODONE-acetaminophen   Allergies as of 11/28/2015 - Review Complete 11/28/2015  Allergen Reaction Noted  . Pork-derived products Nausea And Vomiting 03/14/2014    Family History  Problem Relation Age of Onset  . Hypertension Father   . Kidney disease Father   . Allergies Father   . Deep vein thrombosis Sister   . Pulmonary embolism Sister   . Diabetes Paternal Grandmother     Social History   Social History  . Marital Status: Significant Other     Spouse Name: N/A  . Number of Children: 4  . Years of Education: N/A   Occupational History  . Disabled    Social History Main Topics  . Smoking status: Former   Smoker -- 0.25 packs/day for .5 years    Types: Cigarettes    Quit date: 09/08/1972  . Smokeless tobacco: Never Used  . Alcohol Use: No     Comment: 05/03/2013 "haven't had a glass of wine in ~ 3 months or so; sometimes will have one w/dinner"  . Drug Use: No  . Sexual Activity: Yes    Birth Control/ Protection: None   Other Topics Concern  . Not on file   Social History Narrative   Former smoker- quit over 30 yrs ago   Patient is on disability    REVIEW OF SYSTEMS: Constitutional:  generraly weak, no change ENT:  No nose bleeds Pulm:  Chronic cough CV:  No palpitations, no LE edema.  GU:  No hematuria, no frequency GI:  Per HPI.   Heme:  No unusual bleeding or bruising except Gi this week   Transfusions:  None.  Neuro:  No headaches, no peripheral tingling or numbness Derm:  No itching, no rash or sores.  Endocrine:  No sweats or chills.  No polyuria or dysuria Immunization:  Not queried.  Travel:  None beyond local counties in last few months.    PHYSICAL EXAM: Vital signs in last 24 hours: Filed Vitals:   11/28/15 2153 11/29/15 0655  BP: 100/78 114/77  Pulse: 85 98  Temp: 98.1 F (36.7 C) 100 F (37.8 C)  Resp: 18 22   Wt Readings from Last 3 Encounters:  11/28/15 96.8 kg (213 lb 6.5 oz)  11/17/15 95.346 kg (210 lb 3.2 oz)  10/30/15 97.07 kg (214 lb)    General: cooperative, uncomfortable, chronically ill looking AAM Head:  No asymmetry or swelling  Eyes:  No pallor or icterus Ears:  Slightly HOH  Nose:  No discharge Mouth:  Clear, moist.   Neck:  No mass .  No JVD.  No TMG Lungs:  Very poor BS, no adventitious.  Heart: RRR.  No MRG Abdomen:  Soft, hypoactive BS.  Slight distention and tension.  Tender all over upper and mid abdomen, especially in RUQ.  + hepatomegaly.   Rectal: deferred     Musc/Skeltl: no joint redness, swellilng or contracture Extremities:  No CCE.  Vascular prominence of veins in hands  Neurologic:  Oriented x 3.  No gross weakness, tremor or deficits Skin:  Healed sores on his back/trunk  Tattoos:  none   Psych:  Flat affect, cooperative.   Intake/Output from previous day:   Intake/Output this shift:    LAB RESULTS:  Recent Labs  11/28/15 1507 11/29/15 0639  WBC 1.9* 2.0*  HGB 11.2* 11.2*  HCT 33.0* 33.9*  PLT 82* 72*   BMET Lab Results  Component Value Date   NA 134* 11/29/2015   NA 133* 11/28/2015   NA 136 11/17/2015   K 4.3 11/29/2015   K 4.5 11/28/2015   K 4.2 11/17/2015   CL 95* 11/29/2015   CL 97* 11/28/2015   CL 96* 11/17/2015   CO2 25 11/29/2015   CO2 26 11/28/2015   CO2 28 11/17/2015   GLUCOSE 52* 11/29/2015   GLUCOSE 75 11/28/2015   GLUCOSE 114* 11/17/2015   BUN 25* 11/29/2015   BUN 17 11/28/2015   BUN 17 11/17/2015   CREATININE 6.15* 11/29/2015   CREATININE 4.99* 11/28/2015   CREATININE 6.05* 11/17/2015   CALCIUM 8.7* 11/29/2015   CALCIUM 8.3* 11/28/2015   CALCIUM 9.4 11/17/2015   LFT  Recent Labs  11/28/15 1507  PROT 6.9  ALBUMIN 2.9*    AST 58*  ALT 17  ALKPHOS 141*  BILITOT 1.8*   PT/INR Lab Results  Component Value Date   INR 1.39 11/15/2015   INR 1.47 11/14/2015   INR 1.30 09/05/2014   Hepatitis Panel No results for input(s): HEPBSAG, HCVAB, HEPAIGM, HEPBIGM in the last 72 hours. C-Diff No components found for: CDIFF Lipase     Component Value Date/Time   LIPASE 38 11/14/2015 0345    Drugs of Abuse     Component Value Date/Time   LABOPIA NONE DETECTED 12/11/2007 2233   COCAINSCRNUR NONE DETECTED 12/11/2007 2233   LABBENZ NONE DETECTED 12/11/2007 2233   AMPHETMU NONE DETECTED 12/11/2007 2233   THCU NONE DETECTED 12/11/2007 2233   LABBARB  12/11/2007 2233    NONE DETECTED        DRUG SCREEN FOR MEDICAL PURPOSES ONLY.  IF CONFIRMATION IS NEEDED FOR ANY PURPOSE, NOTIFY  LAB WITHIN 5 DAYS.     RADIOLOGY STUDIES: Dg Chest 2 View  11/28/2015  CLINICAL DATA:  Cough, fever, congestion, and sore throat for 3 days. EXAM: CHEST  2 VIEW COMPARISON:  Chest CTA 11/14/2015 and radiographs 11/13/2015 FINDINGS: The cardiac silhouette remains moderately enlarged. A tortuous, calcified thoracic aorta is again seen. Pulmonary vascular congestion and perihilar and bibasilar opacities are similar to the prior radiographs. No pleural effusion or pneumothorax is identified. No acute osseous abnormality is identified. IMPRESSION: Cardiomegaly with pulmonary vascular congestion and mild edema, unchanged from prior radiographs. Electronically Signed   By: Allen  Grady M.D.   On: 11/28/2015 16:03   Ct Abdomen Pelvis W Contrast  11/28/2015  CLINICAL DATA:  Abdominal pain beginning after umbilical hernia surgery. History of end-stage renal disease. EXAM: CT ABDOMEN AND PELVIS WITH CONTRAST TECHNIQUE: Multidetector CT imaging of the abdomen and pelvis was performed using the standard protocol following bolus administration of intravenous contrast. CONTRAST:  100mL OMNIPAQUE IOHEXOL 300 MG/ML  SOLN COMPARISON:  10/14/2015 FINDINGS: Small bilateral pleural effusions with basilar atelectasis. Peribronchial thickening with mild peribronchial infiltration suggesting acute or chronic bronchitis. Calcified granulomas in the right lung with calcified lymph nodes in the right hilum and mediastinum. Coronary artery calcifications. Cardiac enlargement with prominent right heart enlargement and left atrial enlargement. Calcified lymph nodes at the EG junction. Hepatic enlargement. Sub cm low-attenuation lesion in segment 5 likely represents a cyst. No change since previous study. Retrograde flow of contrast material into the hepatic veins and IVC consistent with passive congestion due to right heart failure. Spleen is enlarged without focal lesion demonstrated. Moderate diffuse free fluid throughout the abdomen  and pelvis likely represents ascites. Prominent lymph nodes in the celiac access and porta hepatis are probably reactive and may be related to liver disease. Mild prominence of retroperitoneal lymph nodes without pathologic enlargement. Gallbladder is contracted with thickened wall, likely physiologic. Pancreas, adrenal glands, inferior vena cava are normal. Bilateral renal parenchymal atrophy with cysts demonstrated in the kidneys. No hydronephrosis. Delayed nephrograms. Calcification of the abdominal aorta without aneurysm. Calcification of the celiac axis and superior mesenteric artery. Stomach, small bowel, and colon are not abnormally distended. Contrast material flows through to the rectum suggesting no evidence of large or small bowel obstruction. No free air in the abdomen. Pelvis: Bladder is decompressed. Prostate gland is enlarged, measuring 4.7 cm diameter. Moderately prominent lymph nodes in the groin regions are likely reactive. Degenerative changes in the spine. No destructive bone lesions. IMPRESSION: Cardiac enlargement with passive congestion of contrast material into the hepatic veins and IVC. Hepatic splenomegaly. Reactive   abdominal lymph nodes likely related to liver disease. Moderate diffuse abdominal and pelvic free fluid consistent with ascites. Atrophic kidneys with delayed nephrograms consistent with history of renal failure. Granulomatous changes in the chest. Small pleural effusions with basilar atelectasis and bronchitic changes. Electronically Signed   By: William  Stevens M.D.   On: 11/28/2015 22:57    ENDOSCOPIC STUDIES: Per HPI  IMPRESSION:   *  Acute hematochezia.  Rule out diverticular bleeding.  No colitis on CT, so less likely ischemic colitis.  The bleeding has not caused drop in Hgb.     *  Unchanged chronic abdominal pain, present since 01/2014 umbilical hernia repair.  This may be due to the passive hepatic congestion from heart failure but still has GB so wonder if a  HIDA scan might be helpful in ruling in/out GB source of sxs.  LFTs are mildly elevated which may be secondary to liver congestion.  Elevated alk phos in ESRD pt is the norm.    *  ESRD  *  CHF.     PLAN:     *  Colonoscopy, EGD tomorrow to further evaluate hematochezia and abdominal pain.    Sarah Gribbin  11/29/2015, 8:50 AM Pager: [phone]     Attending physician's note   I have taken a history, examined the patient and reviewed the chart. I agree with the Advanced Practitioner's note, impression and recommendations.  Hematochezia, suspected LGI source and chronic abdominal pain. Further evaluation with colonoscopy and EGD tomorrow. The risks (including bleeding, perforation, infection, missed lesions, medication reactions and possible hospitalization or surgery if complications occur), benefits, and alternatives to colonoscopy with possible biopsy and possible polypectomy were discussed with the patient and they consent to proceed. The risks (including bleeding, perforation, infection, missed lesions, medication reactions and possible hospitalization or surgery if complications occur), benefits, and alternatives to endoscopy with possible biopsy and possible dilation were discussed with the patient and they consent to proceed. He is at higher risk from sedation and endoscopic procedures due to biventricular heart failure. Severe passive hepatic congestion with ascites from biventricular heart failure is the cause of abnl LFTs and is the likely cause of chronic abdominal pain.   Leniyah Martell, MD FACG [phone] Mon-Fri 8a-5p [phone] after 5p, weekends, holidays     

## 2015-11-29 NOTE — Care Management Important Message (Deleted)
YesImportant Message  Patient Details  Name: Trevor [name] MRN: [id number] Date of Birth: December 08, 1950   Medicare Important Message Given:       Yvone Neu, RN 11/29/2015, 12:10 PM

## 2015-11-29 NOTE — Progress Notes (Signed)
Pt verbalized body aches on arrival to the unit. RN went to give pt PRN percocet along with his scheduled B-complex with vitamin C tablet. Pt refused PO medications because he is on an NPO diet. Will continue to monitor and treat per MD orders.

## 2015-11-29 NOTE — Consult Note (Signed)
[facility] Renal Consultation Note    Indication for Consultation:  Management of ESRD/hemodialysis; anemia, hypertension/volume and secondary hyperparathyroidism PCP: [name], MD  HPI: ROLLYN [name] is a 65 y.o. male with ESRD on hemodialysis MWF at [facility]. PMH significant for biventricular heart failure, hypertension, Afib/Aflutter-patient refuses coumadin, pancytopenia, GERD, syncope, OSA, hepatosplenomegaly, ascites, chronic abdominal pain and distention, liver biopsy 08/2014: sinusoidal dilatation, scattered foci hepatitis negative for cirrhosis.   He was at his hemodialysis treatment yesterday and developed cramping type abdominal pain with subsequent large bright red bloody stool at the HD unit, again on the way from HD. He presented to ED for evaluation and had third bloody stool while in the ED. He also reported cough, URI symptoms for 3-4 days and had temperature of 102 in ED. FLU PCR positive for flu B, CXR Cardiomegaly with pulmonary vascular congestion and mild edema, unchanged from prior radiographs. WBC 1.9 HGB 11.2 PLT 82. GI has been consulted and he has been admitted for GI bleed/Influenza B.   Currently patient awake, alert, oriented without C/O. While talking, he started to cough violently with clonic/tonic type movement of upper extremities, eyes deviated upward and to the right and he collapsed on the bed. Episode last approximately 30-45 seconds. Afterward, he sat back up, staring blankly at me. He has no recall of incident. Alert, oriented X 3, denies chest pain/SOB, N,V. C/O being hungry on liquid diet and is refusing his medications "until they give me some food". Denies further episodes of bloody stools.   Patient has hemodialysis at [facility]. He is compliant with HD there but does not believe "they mix the chemicals correctly and I'm not getting a good treatment". He usually has reasonable IDWG, takes renvela binders and  hectoral for VDRA. Last in-center lab values as follows: Hgb 12.5 (11/21/15) WBC 2.54 Ferritin 1090 Fe 32 TIBC 182 Tsat 18% (11/28/15) Ca 9.2 C Ca 9.5 phos 6.6 (11/28/15)    Past Medical History  Diagnosis Date  . Anemia   . AVF (arteriovenous fistula) (HCC)     Left  . Secondary hyperparathyroidism (HCC)   . Hypovitaminosis D   . Hypertensive urgency     H/o  . CHF (congestive heart failure) (HCC)     EF 20-25%  . Exertional shortness of breath     "related to infection in my lungs right now" (05/03/2013)  . History of gout     "before I started doing the dialysis" (05/03/2013)  . Sleep apnea   . GERD (gastroesophageal reflux disease)   . Syncope     felt secondary to residual anesthesia the day before - 2D echo unremarkable  . Pulmonary embolism (HCC)     with right DVT secondary to recent surgery  . Atrial fibrillation (HCC)     not on coumadin due to large spontaneous hematoma on back and anemia  . Myocardial infarction (HCC) 90's  . Coronary artery disease   . Dysrhythmia     afib  . Peripheral vascular disease (HCC)     dvt leg 12/14  . Hypertension   . ESRD (end stage renal disease) on dialysis (HCC)     [facility] mon/wed/fri  . Atrial flutter (HCC) 04/27/2013  . S/P repair of ventral hernia 03/21/2014  . Hereditary and idiopathic peripheral neuropathy 08/29/2015  . Diverticulosis   . Hepatosplenomegaly 10/18/2015    Workup per Dr Kaplan/ GI in Dec 2015 > abd pain improved after paracentesis x 2 (1.2L, 1.3L).  Had negative hepA/ hep B/ hep C testing. For hepatosplenomagealy pt underwent transjugular liver biopsy by IR with hepatic vein wedge pressure measurement which showed elevated right heart and free hepatic venous pressures likely due to right heart failure.  There was no evidence of portal hypertension. Liver bx originally was reported as "End stage liver disease/ cirrhosis", then was corrected > "No evidence of cirrhosis".  The corrected liver biopsy result reads  "SINUSOIDAL DILATATION WITH SCATTERED FOCI OF HEPATITIS".  Last GI visit was Aug 2016 for bloating/ abd pain, noted he had a normal gastric empty study.  -Right lobe liver lesion. Felt to be hemangioma, Biopsy pending.  -thrombocytopenia. Dates back to 08/2013.  -ESRD. MWF HD.  -biventricular heart failure, acute right sided heart failure. Daily HD to address volume overload.    . Acute on chronic systolic right heart failure (HCC) 11/14/2015  . Elevated troponin 11/14/2015  . Renal insufficiency    Past Surgical History  Procedure Laterality Date  . Av fistula placement Left     Dr. Charlean Sanfilippo; "I've had 2 on the left' (05/03/2013)  . Av fistula placement Right ~ 2011  . Knee arthroscopy Left   . Avgg removal Right 05/04/2013    Procedure: REMOVAL OF ARTERIOVENOUS Fistula Right Arm;  Surgeon: [name], MD;  Location: [facility] OR;  Service: Vascular;  Laterality: Right;  . Insertion of dialysis catheter Right 05/04/2013    Procedure: INSERTION OF DIALYSIS CATHETER;  Surgeon: [name], MD;  Location: [facility] OR;  Service: Vascular;  Laterality: Right;  . Colonoscopy  10-29-2005    Hx: of  . Bascilic vein transposition Left 06/27/2013    Procedure: BASCILIC VEIN TRANSPOSITION;  Surgeon: [name], MD;  Location: [facility] OR;  Service: Vascular;  Laterality: Left;  . Cardioversion N/A 08/29/2013    Procedure: CARDIOVERSION;  Surgeon: [name], MD;  Location: [facility] ENDOSCOPY;  Service: Cardiovascular;  Laterality: N/A;  . Removal of a dialysis catheter  2/15  . Hernia repair  03/21/14    Umbilical hernia-Dr. Derrell Lolling  . Umbilical hernia repair N/A 03/21/2014    Procedure: LAPAROSCOPIC UMBILICAL HERNIA REPAIR WITH MESH;  Surgeon: Axel Filler, MD;  Location: [facility] OR;  Service: General;  Laterality: N/A;  . Insertion of mesh N/A 03/21/2014    Procedure: INSERTION OF MESH;  Surgeon: Axel Filler, MD;  Location: [facility] OR;  Service: General;  Laterality: N/A;  . Venogram Left 05/26/2013    Procedure:  VENOGRAM;  Surgeon: Fransisco Hertz, MD;  Location: [facility] CATH LAB;  Service: Cardiovascular;  Laterality: Left;  . Cardiac catheterization N/A 11/15/2015    Procedure: Right Heart Cath;  Surgeon: Lennette Bihari, MD;  Location: [facility] INVASIVE CV LAB;  Service: Cardiovascular;  Laterality: N/A;  . Cardiac catheterization Right 11/15/2015    Procedure: Left Heart Cath;  Surgeon: Lennette Bihari, MD;  Location: [facility] INVASIVE CV LAB;  Service: Cardiovascular;  Laterality: Right;   Family History  Problem Relation Age of Onset  . Hypertension Father   . Kidney disease Father   . Allergies Father   . Deep vein thrombosis Sister   . Pulmonary embolism Sister   . Diabetes Paternal Grandmother    Social History:  reports that he quit smoking about 43 years ago. His smoking use included Cigarettes. He has a .125 pack-year smoking history. He has never used smokeless tobacco. He reports that he does not drink alcohol or use illicit drugs. Allergies  Allergen Reactions  . Pork-Derived Products  Nausea And Vomiting    Culture   Prior to Admission medications   Medication Sig Start Date End Date Taking? Authorizing Provider  aspirin EC 81 MG EC tablet Take 1 tablet (81 mg total) by mouth daily. 11/17/15  Yes Rodolph Bong, MD  B Complex-C (B-COMPLEX WITH VITAMIN C) tablet Take 1 tablet by mouth daily.   Yes Historical Provider, MD  labetalol (NORMODYNE) 200 MG tablet Take 200 mg by mouth 2 (two) times daily.   Yes Historical Provider, MD  Nutritional Supplements (FEEDING SUPPLEMENT, NEPRO CARB STEADY,) LIQD Take 237 mLs by mouth 2 (two) times daily between meals. 10/19/15  Yes Nishant Dhungel, MD  oxyCODONE-acetaminophen (PERCOCET/ROXICET) 5-325 MG tablet Take 1 tablet by mouth every 6 (six) hours as needed. Pain 10/29/15  Yes Historical Provider, MD  pantoprazole (PROTONIX) 40 MG tablet Take 1 tablet (40 mg total) by mouth 2 (two) times daily before a meal. 11/17/15  Yes Rodolph Bong, MD  sevelamer carbonate  (RENVELA) 800 MG tablet Take 4 tablets (3,200 mg total) by mouth 3 (three) times daily with meals. Patient taking differently: Take 2,400 mg by mouth 3 (three) times daily with meals.  09/12/14  Yes Tora Kindred York, PA-C  Alum & Mag Hydroxide-Simeth (GI COCKTAIL) SUSP suspension Take 30 mLs by mouth 3 (three) times daily as needed for indigestion. Shake well. 11/17/15   Rodolph Bong, MD  carvedilol (COREG) 3.125 MG tablet Take 1 tablet (3.125 mg total) by mouth 2 (two) times daily with a meal. 11/17/15   Rodolph Bong, MD  nitroGLYCERIN (NITROSTAT) 0.4 MG SL tablet Place 1 tablet (0.4 mg total) under the tongue every 5 (five) minutes as needed for chest pain. 11/17/15   Rodolph Bong, MD   Current Facility-Administered Medications  Medication Dose Route Frequency Provider Last Rate Last Dose  . acetaminophen (TYLENOL) tablet 650 mg  650 mg Oral Q6H PRN Zannie Cove, MD       Or  . acetaminophen (TYLENOL) suppository 650 mg  650 mg Rectal Q6H PRN Zannie Cove, MD      . B-complex with vitamin C tablet 1 tablet  1 tablet Oral Daily Zannie Cove, MD   1 tablet at 11/29/15 [phone]  . carvedilol (COREG) tablet 3.125 mg  3.125 mg Oral BID WC Zannie Cove, MD   3.125 mg at 11/29/15 0829  . iohexol (OMNIPAQUE) 300 MG/ML solution 25 mL  25 mL Intravenous Once PRN Zannie Cove, MD      . iohexol (OMNIPAQUE) 300 MG/ML solution 25 mL  25 mL Intravenous Once PRN Zannie Cove, MD      . labetalol (NORMODYNE) tablet 200 mg  200 mg Oral BID Zannie Cove, MD   200 mg at 11/29/15 0917  . morphine 2 MG/ML injection 2 mg  2 mg Intravenous Q3H PRN Zannie Cove, MD   2 mg at 11/29/15 0909  . ondansetron (ZOFRAN) tablet 4 mg  4 mg Oral Q6H PRN Zannie Cove, MD       Or  . ondansetron [facility]) injection 4 mg  4 mg Intravenous Q6H PRN Zannie Cove, MD   4 mg at 11/29/15 0349  . oseltamivir (TAMIFLU) capsule 30 mg  30 mg Oral Daily Zannie Cove, MD   30 mg at 11/29/15 0917  .  oxyCODONE-acetaminophen (PERCOCET/ROXICET) 5-325 MG per tablet 1 tablet  1 tablet Oral Q6H PRN Zannie Cove, MD   1 tablet at 11/29/15 0349  . pantoprazole (PROTONIX) EC tablet 40 mg  40 mg Oral BID AC Zannie Cove, MD   40 mg at 11/29/15 0909  . sevelamer carbonate (RENVELA) tablet 2,400 mg  2,400 mg Oral TID WC Zannie Cove, MD   2,400 mg at 11/29/15 1610   Labs: Basic Metabolic Panel:  Recent Labs Lab 11/28/15 1507 11/29/15 0639  NA 133* 134*  K 4.5 4.3  CL 97* 95*  CO2 26 25  GLUCOSE 75 52*  BUN 17 25*  CREATININE 4.99* 6.15*  CALCIUM 8.3* 8.7*   Liver Function Tests:  Recent Labs Lab 11/28/15 1507  AST 58*  ALT 17  ALKPHOS 141*  BILITOT 1.8*  PROT 6.9  ALBUMIN 2.9*   No results for input(s): LIPASE, AMYLASE in the last 168 hours. No results for input(s): AMMONIA in the last 168 hours. CBC:  Recent Labs Lab 11/28/15 1507 11/29/15 0639  WBC 1.9* 2.0*  NEUTROABS 0.9*  --   HGB 11.2* 11.2*  HCT 33.0* 33.9*  MCV 83.1 83.9  PLT 82* 72*   Cardiac Enzymes: No results for input(s): CKTOTAL, CKMB, CKMBINDEX, TROPONINI in the last 168 hours. CBG: No results for input(s): GLUCAP in the last 168 hours. Iron Studies: No results for input(s): IRON, TIBC, TRANSFERRIN, FERRITIN in the last 72 hours. Studies/Results: Dg Chest 2 View  11/28/2015  CLINICAL DATA:  Cough, fever, congestion, and sore throat for 3 days. EXAM: CHEST  2 VIEW COMPARISON:  Chest CTA 11/14/2015 and radiographs 11/13/2015 FINDINGS: The cardiac silhouette remains moderately enlarged. A tortuous, calcified thoracic aorta is again seen. Pulmonary vascular congestion and perihilar and bibasilar opacities are similar to the prior radiographs. No pleural effusion or pneumothorax is identified. No acute osseous abnormality is identified. IMPRESSION: Cardiomegaly with pulmonary vascular congestion and mild edema, unchanged from prior radiographs. Electronically Signed   By: Sebastian Ache M.D.   On:  11/28/2015 16:03   Ct Abdomen Pelvis W Contrast  11/28/2015  CLINICAL DATA:  Abdominal pain beginning after umbilical hernia surgery. History of end-stage renal disease. EXAM: CT ABDOMEN AND PELVIS WITH CONTRAST TECHNIQUE: Multidetector CT imaging of the abdomen and pelvis was performed using the standard protocol following bolus administration of intravenous contrast. CONTRAST:  OMNIPAQUE IOHEXOL 300 MG/ML  SOLN COMPARISON:  10/14/2015 FINDINGS: Small bilateral pleural effusions with basilar atelectasis. Peribronchial thickening with mild peribronchial infiltration suggesting acute or chronic bronchitis. Calcified granulomas in the right lung with calcified lymph nodes in the right hilum and mediastinum. Coronary artery calcifications. Cardiac enlargement with prominent right heart enlargement and left atrial enlargement. Calcified lymph nodes at the EG junction. Hepatic enlargement. Sub cm low-attenuation lesion in segment 5 likely represents a cyst. No change since previous study. Retrograde flow of contrast material into the hepatic veins and IVC consistent with passive congestion due to right heart failure. Spleen is enlarged without focal lesion demonstrated. Moderate diffuse free fluid throughout the abdomen and pelvis likely represents ascites. Prominent lymph nodes in the celiac access and porta hepatis are probably reactive and may be related to liver disease. Mild prominence of retroperitoneal lymph nodes without pathologic enlargement. Gallbladder is contracted with thickened wall, likely physiologic. Pancreas, adrenal glands, inferior vena cava are normal. Bilateral renal parenchymal atrophy with cysts demonstrated in the kidneys. No hydronephrosis. Delayed nephrograms. Calcification of the abdominal aorta without aneurysm. Calcification of the celiac axis and superior mesenteric artery. Stomach, small bowel, and colon are not abnormally distended. Contrast material flows through to the rectum  suggesting no evidence of large or small bowel obstruction. No free air  in the abdomen. Pelvis: Bladder is decompressed. Prostate gland is enlarged, measuring 4.7 cm diameter. Moderately prominent lymph nodes in the groin regions are likely reactive. Degenerative changes in the spine. No destructive bone lesions. IMPRESSION: Cardiac enlargement with passive congestion of contrast material into the hepatic veins and IVC. Hepatic splenomegaly. Reactive abdominal lymph nodes likely related to liver disease. Moderate diffuse abdominal and pelvic free fluid consistent with ascites. Atrophic kidneys with delayed nephrograms consistent with history of renal failure. Granulomatous changes in the chest. Small pleural effusions with basilar atelectasis and bronchitic changes. Electronically Signed   By: Burman Nieves M.D.   On: 11/28/2015 22:57    ROS: As per HPI otherwise negative.   Physical Exam: Filed Vitals:   11/28/15 1750 11/28/15 1815 11/28/15 2153 11/29/15 0655  BP:  110/82 100/78 114/77  Pulse:  81 85 98  Temp: 100.6 F (38.1 C)  98.1 F (36.7 C) 100 F (37.8 C)  TempSrc: Oral   Oral  Resp:   18 22  Weight:      SpO2:  94% 100% 97%     General: Well developed, well nourished, in no acute distress. Head: Normocephalic, atraumatic, sclera non-icteric, mucus membranes are moist Neck: Supple. Chronic JVD 10 mm Lungs: Bilateral breath sounds no wheezes, rales, few scattered rhonchi which clear with cough. Non-productive cough. Breathing is unlabored.  Heart: Irregularly, irregular. S1, S2, II/VI systolic M.  Abdomen: distended, slightly tender upper quadrants, hepatomegaly present, possible ascites. M-S:  Strength and tone appear normal for age. Lower extremities:without edema or ischemic changes, no open wounds  Neuro: Alert and oriented X 3. Moves all extremities spontaneously. Psych:  Responds to questions appropriately with a normal affect. Dialysis Access: LUA AVF +  bruit  Dialysis: [facility]  MWF  4h    F200   500/800   97kg   2/2 bath  LUA AVF  Heparin none Hectoral 6 mcg IV Q MWF  Assessment/Plan: 1.  Lower GI bleed: GI consulted. For colonoscopy tomorrow. HGB stable. On PPI. CT negative for colitis.  2.  Influenza B: Per primary. Started on tamiflu-adjusted to renal dose. Tmax 100.6. Episode of possible cough syncope as noted above. Attending notified of event.  3.  ESRD -  MWF @ [facility]. Will have HD on schedule tomorrow. No heparin.  4.  Hypertension/volume  - continue coreg/labetolol. BP controlled. WT 96.8 yesterday slightly below OP EDW however CXR shows vascular congestion. Will attempt 2-2.5 liters UFG after colonscopy tomorrow.  5.  Anemia  - Hgb stable 11.2  6.  Metabolic bone disease - continue binders when eating solid food-hold for now since he is refusing, continue VDRA 7.  Nutrition - Albumin 2.9 liquid diet at present. Renal diet when able to eat, prostat and renal vit.  8.  DCM: per primary  9. Chronic Afib: Per primary, not on anticoagulants.  10. Pancytopenia: Per primary. Thought to be related to hepatosplenomegaly and splenic sequestration  Rita H. Manson Passey, NP-C 11/29/2015, 12:50 PM  [facility] Beeper [phone]    Pt seen, examined and agree w A/P as above. ESRD patient with known CM, afib, anemia presenting with bloody stools, 3 episodes today including one in the ED here.  Hb 12.  GI evaluating and planning for possible colonscopy.  HD tomorrow.  NO heparin.   [name] MD [employer] pager [phone]    cell [phone] 11/29/2015, 4:15 PM

## 2015-11-29 NOTE — Progress Notes (Signed)
[name]                                            Patient Demographics:    Trevor [name], is a 65 y.o. male, DOB - [birth year], UJW:[id number]  Admit date - 11/28/2015   Admitting Physician Zannie Cove, MD  Outpatient Primary MD for the patient is [name], MD  LOS - 1  Chief Complaint  Patient presents with  . Abdominal Pain, rectal bleeding        Subjective:  Complains of abd pain, bleeding improving   Assessment  & Plan :   Lower GI bleed/chronic abdominal pain -Ct abd pelvis without evidence of ischemic colitis, bleeding subsiding, Hb stable -Hold aspirin -[facility] consulted , endoscopic workup was being planned prior to this admission  -colonoscopy in 2007 with diverticulosis -i wonder if he has chronic intestinal ischemia, causing his chronic abd pain, Ct with calcification of celiac plexus and SMA, await GI input today  Influenza B -continue Tamiflu even though symptoms started >48hours prior to admission -FU Blood Cx   ESRD on hemodialysis (HCC) -Will notify renal of admission    DCM- EF 25-30% by echo 09/04/14 -Volume managed with dialysis, resume Coreg -resume labetalol   Abdominal pain, chronic, epigastric -As above -last admission, had an Abdominal ultrasound with nodular hepatic contour and splenomegaly with findings consistent with chronic liver disease. CT abdomen and pelvis on 10/14/2015 with chronic granulomatous disease with adenopathy and splenomegaly and volume overload. Patient had a prior liver biopsy December 2015 which showed sinusoidal dilatation and scattered foci hepatitis and negative for cirrhosis then   Chronic atrial fibrillation (HCC) -CHADS2VASC is 3  -continue coreg, rate controlled. Patient has refused anticoagulation in the past secondary to prior history of Spontaneous back hematoma and  GI bleed per previous documentation and not appropriate at this time anyway   Pancytopenia -likely due to hepatosplenomegaly and splenic sequestration, worsened due to URI/viral illness  history of DVT/PE -remote, not in our system from 2014 onwards -he was previously on Coumadin. Discontinued secondary to spontaneous back hematoma. Patient has refused anticoagulation since  DVT Prophylaxis- SCDs  Family Communication: no family at bedside, plan d/w pt Code Status Full Code DC to Home when stable, pending workup   Consults  : GI   DVT Prophylaxis  :  SCDs  Lab Results  Component Value Date   PLT 72* 11/29/2015    Anti-infectives    Start     Dose/Rate Route Frequency Ordered Stop   11/28/15 1830  oseltamivir (TAMIFLU) capsule 30 mg     30 mg Oral Daily 11/28/15 1743 12/03/15 0959        Objective:   Filed Vitals:   11/28/15 1750 11/28/15 1815 11/28/15 2153 11/29/15 0655  BP:  110/82 100/78 114/77  Pulse:  81 85 98  Temp: 100.6 F (38.1 C)  98.1 F (36.7 C) 100 F (37.8 C)  TempSrc: Oral   Oral  Resp:   18 22  Weight:      SpO2:  94% 100% 97%    Wt Readings from Last 3 Encounters:  11/28/15 96.8 kg (213 lb 6.5 oz)  11/17/15 95.346 kg (210 lb 3.2 oz)  10/30/15 97.07 kg (214 lb)    No intake or output data in the 24 hours ending 11/29/15 1131   Physical Exam  Awake  Alert, Oriented X 3, uncomfortable appearing HEENT: [place].AT,PERRAL, Supple Neck,No JVD, No cervical lymphadenopathy appriciated.  Lungs: Symmetrical Chest wall movement, Good air movement bilaterally, CTAB CVA: Systolic murmur, RRR,No Gallops,Rubs  Abd: soft, mild diffuse tenderness, +ve B.Sounds, Abd Soft, hepatosplenomegaly EXt; 1plus edema    Data Review:    CBC  Recent Labs Lab 11/28/15 1507 11/29/15 0639  WBC 1.9* 2.0*  HGB 11.2* 11.2*  HCT 33.0* 33.9*  PLT 82* 72*  MCV 83.1 83.9  MCH 28.2 27.7  MCHC 33.9 33.0  RDW 16.0* 16.0*  LYMPHSABS 0.5*  --   MONOABS 0.5  --     EOSABS 0.0  --   BASOSABS 0.0  --     Chemistries   Recent Labs Lab 11/28/15 1507 11/29/15 0639  NA 133* 134*  K 4.5 4.3  CL 97* 95*  CO2 26 25  GLUCOSE 75 52*  BUN 17 25*  CREATININE 4.99* 6.15*  CALCIUM 8.3* 8.7*  AST 58*  --   ALT 17  --   ALKPHOS 141*  --   BILITOT 1.8*  --    ------------------------------------------------------------------------------------------------------------------ No results for input(s): CHOL, HDL, LDLCALC, TRIG, CHOLHDL, LDLDIRECT in the last 72 hours.  Lab Results  Component Value Date   HGBA1C 5.4 11/14/2015   ------------------------------------------------------------------------------------------------------------------ No results for input(s): TSH, T4TOTAL, T3FREE, THYROIDAB in the last 72 hours.  Invalid input(s): FREET3 ------------------------------------------------------------------------------------------------------------------ No results for input(s): VITAMINB12, FOLATE, FERRITIN, TIBC, IRON, RETICCTPCT in the last 72 hours.  Coagulation profile No results for input(s): INR, PROTIME in the last 168 hours.  No results for input(s): DDIMER in the last 72 hours.  Cardiac Enzymes No results for input(s): CKMB, TROPONINI, MYOGLOBIN in the last 168 hours.  Invalid input(s): CK ------------------------------------------------------------------------------------------------------------------    Component Value Date/Time   BNP 1959.9* 08/30/2014 0402    Inpatient Medications  Scheduled Meds: . B-complex with vitamin C  1 tablet Oral Daily  . carvedilol  3.125 mg Oral BID WC  . labetalol  200 mg Oral BID  . oseltamivir  30 mg Oral Daily  . pantoprazole  40 mg Oral BID AC  . sevelamer carbonate  2,400 mg Oral TID WC   Continuous Infusions:  PRN Meds:.acetaminophen **OR** acetaminophen, iohexol, iohexol, morphine injection, ondansetron **OR** ondansetron (ZOFRAN) IV, oxyCODONE-acetaminophen  Micro Results No results  found for this or any previous visit (from the past 240 hour(s)).  Radiology Reports Dg Chest 2 View  11/28/2015  CLINICAL DATA:  Cough, fever, congestion, and sore throat for 3 days. EXAM: CHEST  2 VIEW COMPARISON:  Chest CTA 11/14/2015 and radiographs 11/13/2015 FINDINGS: The cardiac silhouette remains moderately enlarged. A tortuous, calcified thoracic aorta is again seen. Pulmonary vascular congestion and perihilar and bibasilar opacities are similar to the prior radiographs. No pleural effusion or pneumothorax is identified. No acute osseous abnormality is identified. IMPRESSION: Cardiomegaly with pulmonary vascular congestion and mild edema, unchanged from prior radiographs. Electronically Signed   By: Sebastian Ache M.D.   On: 11/28/2015 16:03   Dg Chest 2 View  11/13/2015  CLINICAL DATA:  Chest pain and shortness of breath today, cough for 1 month, hypertension, CHF, gout, GERD, prior pulmonary embolism, coronary artery disease post MI, atrial fibrillation/flutter, end-stage renal disease on dialysis EXAM: CHEST  2 VIEW COMPARISON:  10/31/2015 FINDINGS: Enlargement of cardiac silhouette with pulmonary vascular congestion. Calcified tortuous aorta. Calcified RIGHT paratracheal lymph nodes again seen. Interstitial infiltrates in the perihilar regions likely reflecting mild chronic CHF. No segmental consolidation, pleural effusion  or pneumothorax. Osseous mineralization normal without acute bony abnormality. IMPRESSION: Enlargement of cardiac silhouette with pulmonary venous hypertension and mild chronic failure. No new abnormalities. Electronically Signed   By: Ulyses Southward M.D.   On: 11/13/2015 17:06   Dg Chest 2 View  10/31/2015  CLINICAL DATA:  Dialysis patient reports he is not been feeling well for over 1 month. Chest pain, pressure and abdominal bloating during dialysis. Initial encounter. EXAM: CHEST  2 VIEW COMPARISON:  Single view of the chest 10/26/2015, 09/07/2014 and CT chest 09/05/2014.  FINDINGS: There is marked cardiomegaly. No pneumothorax or pleural effusion is identified. Pulmonary vascular congestion is seen but there is no edema. Calcified mediastinal and hilar lymphadenopathy is compatible with old granulomatous disease or sarcoid and appears unchanged. IMPRESSION: Cardiomegaly without acute disease. Electronically Signed   By: Drusilla Kanner M.D.   On: 10/31/2015 16:10   Ct Angio Chest Pe W/cm &/or Wo Cm  11/14/2015  CLINICAL DATA:  Pleuritic chest pain. History of pulmonary embolus and DVT. EXAM: CT ANGIOGRAPHY CHEST WITH CONTRAST TECHNIQUE: Multidetector CT imaging of the chest was performed using the standard protocol during bolus administration of intravenous contrast. Multiplanar CT image reconstructions and MIPs were obtained to evaluate the vascular anatomy. CONTRAST:  33mL OMNIPAQUE IOHEXOL 350 MG/ML SOLN COMPARISON:  Chest radiograph 1 day prior.  Chest CT 09/05/2014 FINDINGS: Resolution of previous filling defect in right middle lobe pulmonary artery. No new filling defects or acute pulmonary embolus. Multi chamber cardiomegaly, with prominent right atrial dilatation and significant contrast refluxing into the hepatic veins and IVC. Small pericardial effusion. Thoracic aorta with scattered atherosclerosis. No aneurysm. Coronary artery calcifications are seen. Extensive calcified mediastinal and bilateral hilar adenopathy. Calcified granuloma in the right middle lobe. Heterogeneous lower lobe ground-glass opacities and bronchial thickening. A chronic triangular opacity in the posterior medial right upper lobe adjacent to right pleural thickening/minimal effusion, most consistent with chronic atelectasis. Evaluation of the upper abdomen demonstrates hepatosplenomegaly and small volume perihepatic ascites. Atrophic kidneys. There are no acute or suspicious osseous abnormalities. Increased bone mineral density. Review of the MIP images confirms the above findings. IMPRESSION: 1.  No pulmonary embolus. 2. Prominent right heart distension with contrast refluxing into the hepatic veins and IVC consistent with right heart failure. This appears chronic but advanced in degree. 3. Small pericardial effusion. 4. Multifocal calcified mediastinal adenopathy, sequela of granulomatous disease versus sarcoidosis. 5. Chronic hepatosplenomegaly. Small volume intra-abdominal ascites. Electronically Signed   By: Rubye Oaks M.D.   On: 11/14/2015 03:45   US Abdomen Complete  11/14/2015  CLINICAL DATA:  Abdominal distension. EXAM: ABDOMEN ULTRASOUND COMPLETE COMPARISON:  CT 05/02/2016. FINDINGS: Gallbladder: Physiologically distended. Diffuse wall thickening measuring up to 9 mm. No gallstones visualized. No intraluminal sludge. Ring down artifact noted about the fundus. No sonographic Murphy sign noted by sonographer. Small amount of pericholecystic ascites. Common bile duct: Diameter: 3-4 mm. Liver: Hypodense lesion on CT not well seen sonographically. Within normal limits in parenchymal echogenicity. Nodular hepatic contours. Normal directional flow in the main portal vein. IVC: Prominent in diameter. Pancreas: Not well visualized. Spleen: Prominent size measuring 13.2 x 6.4 x 6.2 cm, splenic volume 274 mL. Right Kidney: Length: 8.3 cm. Atrophic with thinning of the renal parenchyma and increased echogenicity, patient with history of chronic renal disease on dialysis. No mass or hydronephrosis visualized. Left Kidney: Length: 6.6 cm. Atrophic with thinning of the renal parenchyma and increased renal echogenicity. Probable cyst arising from the lateral kidney measuring 1.9 cm. No  solid mass or hydronephrosis visualized. Abdominal aorta: No aneurysm visualized. Proximal aorta measures 3 cm, mid and distal aorta obscured. Other findings: Small volume perihepatic ascites. IMPRESSION: 1. Nodular hepatic contours, with diffuse gallbladder wall thickening and mild splenomegaly. Findings consistent with  chronic liver disease. Small volume perihepatic ascites. 2. Ring down artifact in the gallbladder wall, may reflect adenomyomatosis. 3. Shrunken echogenic kidneys consistent chronic renal disease. Electronically Signed   By: Rubye Oaks M.D.   On: 11/14/2015 01:22   Ct Abdomen Pelvis W Contrast  11/28/2015  CLINICAL DATA:  Abdominal pain beginning after umbilical hernia surgery. History of end-stage renal disease. EXAM: CT ABDOMEN AND PELVIS WITH CONTRAST TECHNIQUE: Multidetector CT imaging of the abdomen and pelvis was performed using the standard protocol following bolus administration of intravenous contrast. CONTRAST:  OMNIPAQUE IOHEXOL 300 MG/ML  SOLN COMPARISON:  10/14/2015 FINDINGS: Small bilateral pleural effusions with basilar atelectasis. Peribronchial thickening with mild peribronchial infiltration suggesting acute or chronic bronchitis. Calcified granulomas in the right lung with calcified lymph nodes in the right hilum and mediastinum. Coronary artery calcifications. Cardiac enlargement with prominent right heart enlargement and left atrial enlargement. Calcified lymph nodes at the EG junction. Hepatic enlargement. Sub cm low-attenuation lesion in segment 5 likely represents a cyst. No change since previous study. Retrograde flow of contrast material into the hepatic veins and IVC consistent with passive congestion due to right heart failure. Spleen is enlarged without focal lesion demonstrated. Moderate diffuse free fluid throughout the abdomen and pelvis likely represents ascites. Prominent lymph nodes in the celiac access and porta hepatis are probably reactive and may be related to liver disease. Mild prominence of retroperitoneal lymph nodes without pathologic enlargement. Gallbladder is contracted with thickened wall, likely physiologic. Pancreas, adrenal glands, inferior vena cava are normal. Bilateral renal parenchymal atrophy with cysts demonstrated in the kidneys. No  hydronephrosis. Delayed nephrograms. Calcification of the abdominal aorta without aneurysm. Calcification of the celiac axis and superior mesenteric artery. Stomach, small bowel, and colon are not abnormally distended. Contrast material flows through to the rectum suggesting no evidence of large or small bowel obstruction. No free air in the abdomen. Pelvis: Bladder is decompressed. Prostate gland is enlarged, measuring 4.7 cm diameter. Moderately prominent lymph nodes in the groin regions are likely reactive. Degenerative changes in the spine. No destructive bone lesions. IMPRESSION: Cardiac enlargement with passive congestion of contrast material into the hepatic veins and IVC. Hepatic splenomegaly. Reactive abdominal lymph nodes likely related to liver disease. Moderate diffuse abdominal and pelvic free fluid consistent with ascites. Atrophic kidneys with delayed nephrograms consistent with history of renal failure. Granulomatous changes in the chest. Small pleural effusions with basilar atelectasis and bronchitic changes. Electronically Signed   By: Burman Nieves M.D.   On: 11/28/2015 22:57    Time Spent in minutes     Kaleigh Spiegelman M.D on 11/29/2015 at 11:31 AM  Between 7am to 7pm - Pager - [phone]  After 7pm go to [email or web address] - password [email or web address]  [facility] -  Office  [phone]

## 2015-11-29 NOTE — Progress Notes (Signed)
Hypoglycemic Event  CBG: 38  Treatment: D50 IV 25 mL  Symptoms: Shaky, Hungry, Vision Changes  Follow-up CBG: Time:1420 CBG Result:99  Possible Reasons for Event: Inadequate meal intake  Comments/MD notified:Dr. Jomarie Longs - ordered to start hypoglycemic order set.      Trevor [name] D

## 2015-11-29 NOTE — Care Management Important Message (Signed)
Important Message  Patient Details  Name: MANFORD ECKHARDT MRN: [id number] Date of Birth: 05/16/1951   Medicare Important Message Given:  Yes    Aitana Burry, Derrill Memo, RN 11/29/2015, 12:12 PM

## 2015-11-30 ENCOUNTER — Encounter (HOSPITAL_COMMUNITY)
Admission: EM | Disposition: A | Discharge status: Home / Self Care | Payer: Self-pay | Source: Home / Self Care | Attending: Internal Medicine

## 2015-11-30 ENCOUNTER — Encounter (HOSPITAL_COMMUNITY): Payer: Self-pay

## 2015-11-30 ENCOUNTER — Inpatient Hospital Stay (HOSPITAL_COMMUNITY): Payer: Medicare Other | Admitting: Anesthesiology

## 2015-11-30 DIAGNOSIS — R1011 Right upper quadrant pain: Secondary | ICD-10-CM

## 2015-11-30 DIAGNOSIS — K2970 Gastritis, unspecified, without bleeding: Secondary | ICD-10-CM

## 2015-11-30 DIAGNOSIS — R1010 Upper abdominal pain, unspecified: Secondary | ICD-10-CM

## 2015-11-30 DIAGNOSIS — I420 Dilated cardiomyopathy: Secondary | ICD-10-CM

## 2015-11-30 DIAGNOSIS — G8929 Other chronic pain: Secondary | ICD-10-CM | POA: Insufficient documentation

## 2015-11-30 DIAGNOSIS — K921 Melena: Secondary | ICD-10-CM | POA: Insufficient documentation

## 2015-11-30 HISTORY — PX: ESOPHAGOGASTRODUODENOSCOPY: SHX5428

## 2015-11-30 HISTORY — PX: COLONOSCOPY: SHX5424

## 2015-11-30 LAB — CBC
HCT: 32.7 % — ABNORMAL LOW (ref 39.0–52.0)
HEMATOCRIT: 33.2 % — AB (ref 39.0–52.0)
Hemoglobin: 11.4 g/dL — ABNORMAL LOW (ref 13.0–17.0)
Hemoglobin: 11 g/dL — ABNORMAL LOW (ref 13.0–17.0)
MCH: 28.5 pg (ref 26.0–34.0)
MCH: 27.5 pg (ref 26.0–34.0)
MCHC: 34.3 g/dL (ref 30.0–36.0)
MCHC: 33.6 g/dL (ref 30.0–36.0)
MCV: 83 fL (ref 78.0–100.0)
MCV: 81.8 fL (ref 78.0–100.0)
Platelets: 55 10*3/uL — ABNORMAL LOW (ref 150–400)
Platelets: 55 K/uL — ABNORMAL LOW (ref 150–400)
RBC: 4 MIL/uL — ABNORMAL LOW (ref 4.22–5.81)
RBC: 4 MIL/uL — ABNORMAL LOW (ref 4.22–5.81)
RDW: 16.2 % — AB (ref 11.5–15.5)
RDW: 15.8 % — ABNORMAL HIGH (ref 11.5–15.5)
WBC: 3.5 10*3/uL — AB (ref 4.0–10.5)
WBC: 2.9 K/uL — ABNORMAL LOW (ref 4.0–10.5)

## 2015-11-30 LAB — RENAL FUNCTION PANEL
Albumin: 2.9 g/dL — ABNORMAL LOW (ref 3.5–5.0)
Anion gap: 15 (ref 5–15)
BUN: 44 mg/dL — ABNORMAL HIGH (ref 6–20)
CO2: 21 mmol/L — ABNORMAL LOW (ref 22–32)
Calcium: 8.5 mg/dL — ABNORMAL LOW (ref 8.9–10.3)
Chloride: 96 mmol/L — ABNORMAL LOW (ref 101–111)
Creatinine, Ser: 9.12 mg/dL — ABNORMAL HIGH (ref 0.61–1.24)
GFR calc Af Amer: 6 mL/min — ABNORMAL LOW (ref >60)
GFR calc non Af Amer: 5 mL/min — ABNORMAL LOW (ref >60)
Glucose, Bld: 91 mg/dL (ref 65–99)
Phosphorus: 7.4 mg/dL — ABNORMAL HIGH (ref 2.5–4.6)
Potassium: 4.5 mmol/L (ref 3.5–5.1)
Sodium: 132 mmol/L — ABNORMAL LOW (ref 135–145)

## 2015-11-30 LAB — GLUCOSE, CAPILLARY
GLUCOSE-CAPILLARY: 78 mg/dL (ref 65–99)
GLUCOSE-CAPILLARY: 131 mg/dL — AB (ref 65–99)
GLUCOSE-CAPILLARY: 84 mg/dL (ref 65–99)
GLUCOSE-CAPILLARY: 87 mg/dL (ref 65–99)
Glucose-Capillary: 67 mg/dL (ref 65–99)
Glucose-Capillary: 93 mg/dL (ref 65–99)
Glucose-Capillary: 64 mg/dL — ABNORMAL LOW (ref 65–99)

## 2015-11-30 LAB — BASIC METABOLIC PANEL
Anion gap: 15 (ref 5–15)
BUN: 41 mg/dL — ABNORMAL HIGH (ref 6–20)
CALCIUM: 8.8 mg/dL — AB (ref 8.9–10.3)
CHLORIDE: 97 mmol/L — AB (ref 101–111)
CO2: 24 mmol/L (ref 22–32)
CREATININE: 8.77 mg/dL — AB (ref 0.61–1.24)
GFR calc non Af Amer: 6 mL/min — ABNORMAL LOW (ref >60)
GFR, EST AFRICAN AMERICAN: 7 mL/min — AB (ref >60)
GLUCOSE: 76 mg/dL (ref 65–99)
Potassium: 4.5 mmol/L (ref 3.5–5.1)
Sodium: 136 mmol/L (ref 135–145)

## 2015-11-30 SURGERY — COLONOSCOPY
Anesthesia: Monitor Anesthesia Care

## 2015-11-30 MED ORDER — PRO-STAT SUGAR FREE PO LIQD
30.0000 mL | Freq: Two times a day (BID) | ORAL | Status: DC
  Administered 2015-12-01: 30 mL via ORAL
  Filled 2015-11-30 (×3): qty 30

## 2015-11-30 MED ORDER — SODIUM CHLORIDE 0.9 % IV SOLN
INTRAVENOUS | Status: DC | PRN
  Administered 2015-11-30: 10:00:00 via INTRAVENOUS

## 2015-11-30 MED ORDER — OXYCODONE-ACETAMINOPHEN 5-325 MG PO TABS
ORAL_TABLET | ORAL | Status: AC
  Administered 2015-11-30: 20:00:00
  Filled 2015-11-30: qty 1

## 2015-11-30 MED ORDER — SODIUM CHLORIDE 0.9 % IV SOLN: 100.0000 mL | INTRAVENOUS | Status: DC | PRN

## 2015-11-30 MED ORDER — DEXTROSE 50 % IV SOLN
INTRAVENOUS | Status: AC
  Administered 2015-11-30: 25 mL
  Filled 2015-11-30: qty 50

## 2015-11-30 MED ORDER — BUTAMBEN-TETRACAINE-BENZOCAINE 2-2-14 % EX AERO
INHALATION_SPRAY | CUTANEOUS | Status: DC | PRN
  Administered 2015-11-30: 1 via TOPICAL

## 2015-11-30 MED ORDER — LIDOCAINE HCL (PF) 1 % IJ SOLN
5.0000 mL | INTRAMUSCULAR | Status: DC | PRN
  Filled 2015-11-30: qty 5

## 2015-11-30 MED ORDER — EPHEDRINE SULFATE 50 MG/ML IJ SOLN
INTRAMUSCULAR | Status: DC | PRN
  Administered 2015-11-30 (×4): 10 mg via INTRAVENOUS

## 2015-11-30 MED ORDER — SODIUM CHLORIDE 0.9 % IV SOLN: INTRAVENOUS | Status: DC

## 2015-11-30 MED ORDER — DEXTROSE 5 % IV SOLN
10.0000 mg | INTRAVENOUS | Status: DC | PRN
  Administered 2015-11-30: 60 ug/min via INTRAVENOUS

## 2015-11-30 MED ORDER — LIDOCAINE-PRILOCAINE 2.5-2.5 % EX CREA: 1.0000 "application " | TOPICAL_CREAM | CUTANEOUS | Status: DC | PRN

## 2015-11-30 MED ORDER — PROPOFOL 500 MG/50ML IV EMUL
INTRAVENOUS | Status: DC | PRN
  Administered 2015-11-30: 60 ug/kg/min via INTRAVENOUS

## 2015-11-30 MED ORDER — PENTAFLUOROPROP-TETRAFLUOROETH EX AERO: 1.0000 "application " | INHALATION_SPRAY | CUTANEOUS | Status: DC | PRN

## 2015-11-30 MED ORDER — MEPERIDINE HCL 25 MG/ML IJ SOLN: 6.2500 mg | INTRAMUSCULAR | Status: DC | PRN

## 2015-11-30 MED ORDER — FENTANYL CITRATE (PF) 100 MCG/2ML IJ SOLN: 25.0000 ug | INTRAMUSCULAR | Status: DC | PRN

## 2015-11-30 NOTE — Interval H&P Note (Signed)
History and Physical Interval Note:  11/30/2015 9:50 AM  Trevor [name]  has presented today for surgery, with the diagnosis of hematochezia and chronic abdominal pain.  The various methods of treatment have been discussed with the patient and family. After consideration of risks, benefits and other options for treatment, the patient has consented to  Procedure(s): COLONOSCOPY (N/A) ESOPHAGOGASTRODUODENOSCOPY (EGD) (N/A) as a surgical intervention .  The patient's history has been reviewed, patient examined, no change in status, stable for surgery.  I have reviewed the patient's chart and labs.  Questions were answered to the patient's satisfaction.     Venita Lick. Russella Dar

## 2015-11-30 NOTE — Op Note (Signed)
[facility] Patient Name: Trevor [name] Procedure Date : 11/30/2015 MRN: [id number] Attending MD: Meryl Dare , MD Date of Birth: [birth year] CSN: [id number] Age: 65 Admit Type: Inpatient Procedure:                Colonoscopy Indications:              Hematochezia Providers:                Venita Lick. Russella Dar, MD, Dwain Sarna, RN, Lorenda Ishihara, Technician, Orvilla Fus, CRNA Referring MD:              Medicines:                Monitored Anesthesia Care Complications:            No immediate complications. Estimated Blood Loss:     Estimated blood loss: none. Procedure:                Pre-Anesthesia Assessment:                           - Prior to the procedure, a History and Physical                            was performed, and patient medications and                            allergies were reviewed. The patient's tolerance of                            previous anesthesia was also reviewed. The risks                            and benefits of the procedure and the sedation                            options and risks were discussed with the patient.                            All questions were answered, and informed consent                            was obtained. Prior Anticoagulants: The patient has                            taken no previous anticoagulant or antiplatelet                            agents. ASA Grade Assessment: III - A patient with                            severe systemic disease. After reviewing the risks  and benefits, the patient was deemed in                            satisfactory condition to undergo the procedure.                           After obtaining informed consent, the colonoscope                            was passed under direct vision. Throughout the                            procedure, the patient's blood pressure, pulse, and                            oxygen saturations were  monitored continuously. The                            EC-[id number] ([id number]) scope was introduced through                            the anus and advanced to the the cecum, identified                            by appendiceal orifice and ileocecal valve. The                            colonoscopy was performed without difficulty. The                            patient tolerated the procedure fairly well. The                            quality of the bowel preparation was good. The                            ileocecal valve, appendiceal orifice, and rectum                            were photographed. Scope In: 10:29:42 AM Scope Out: 10:41:34 AM Scope Withdrawal Time: 0 hours 9 minutes 11 seconds  Total Procedure Duration: 0 hours 11 minutes 52 seconds  Findings:      The perianal and digital rectal examinations were normal.      The colon (entire examined portion) appeared normal.      Internal hemorrhoids were found during retroflexion. The hemorrhoids       were moderate and Grade II (internal hemorrhoids that prolapse but       reduce spontaneously). Impression:               - The entire examined colon is normal.                           - Internal hemorrhoids.                           -  No specimens collected. Moderate Sedation:      none Recommendation:           - Return patient to hospital ward for ongoing care.                           - Resume previous diet.                           - Continue present medications.                           - Use hydrocortisone suppository 25 mg 1 per rectum                            once a day PRN.                           - Repeat colonoscopy in 10 years for screening                            purposes. Procedure Code(s):        --- Professional ---                           [phone], Colonoscopy, flexible; diagnostic, including                            collection of specimen(s) by brushing or washing,                            when  performed (separate procedure) Diagnosis Code(s):        --- Professional ---                           K64.1, Second degree hemorrhoids                           K92.1, Melena (includes Hematochezia) CPT copyright 2016 [employer]. All rights reserved. The codes documented in this report are preliminary and upon coder review may  be revised to meet current compliance requirements. Venita Lick. Russella Dar, MD Meryl Dare, MD 11/30/2015 11:06:44 AM This report has been signed electronically. Number of Addenda: 0

## 2015-11-30 NOTE — Progress Notes (Signed)
In recovery initial pressure  84/39 with CRNA in room o2 sat on 6 L is 90% then went to 86-89. Next bp was 52/36. PT aroused and lying flat. Giving NS. Dr. Blima Dessert called and made aware of BP and O2 sat. To place on mask. No new orders other than to arouse pt. Next bp was 113/21 in leg as was taken in procedure. Will continue to assess. o2 sat on simple mask 15 L is 91 % .    1114- Dr. Gentry Roch to bedside

## 2015-11-30 NOTE — H&P (View-Only) (Signed)
                                                                           [facility] Consult: 8:50 AM 11/29/2015  LOS: 1 day    Referring Provider: Dr Joseph.   Primary Care Physician:  SANDERS,ROBYN N, MD Primary Gastroenterologist:  Dr. Nandigam.  Previously Dr Kaplan     Reason for Consultation:  hematochezia   HPI: Trevor [name] is a 65 y.o. male.  ESRD. On HD MWF. Biventricular heart failure.  Thrombocytopenia. 10/2013 CT: Splenomegaly, reflux contrast into hepatic veins, c/w right heart dysfunction. Ascites. S/p Paracentesis 09/02/2014: no SBP. Liver biopsy 08/2014: sinusoidal dilatation, scattered foci hepatitis. At one point was thought he had cirrhosis, but work up proved this incorrect. Constipation/bloating, treated with Amitiza in past (no longer takes as constipation not an issue). S/p 01/2014 laparoscopic umbilical hernia repair. A fib/flutter, not on AC due to anemia and hx large spontaneous hematoma on back. S/p cardioversion 08/2013. CAD. Hx chronic abdominal bloating and intermittent upper abdominal pain since 01/2014 umbilical hernia surgery.  Relieved with Morphine. 10/2005 Colonoscopy.  For IDA.   Diverticulosis. OSA. 2015 Liver biopsy.  Path: Sinusoidal dilatation, scattered foci hepatitis Did not end up having a dx of cirrhosis.  09/03/15 Paracentesis: no SBP.  10/14/15 CT abdomen/pelvis: " Chronic granulomatous disease with adenopathy and splenomegaly", volume overload. Bulky adenopathy in the hepatic hilum with faint alcifications, stable since 2015. Portacaval node measures 33 mm in short axis. Small ascites in the pelvis, likely from volume overload. Large appearance of the liver without acute finding. Stable 14 mm subcapsular nodule in segment 6. Hazy high-density layering in the gallbladder, likely sludge. No calcified gallstone. ericholecystic  edema is mild and in keeping with diffuse etroperitoneal edema a considered reactive (could be secondary to atient's volume overload, hepatitis-C, or right heart dysfunction). Note pt is Hep B/C/A non-reactive.  Admission 2/5 - 10/19/15.  Dr Nandigam saw pt for abdominal pain but felt sxs functional and did not pursue egd or colonoscopy as pt had desired.  Not slight elevation of T bili to 3.1, AST 54, alk phos 227,  in 09/09/15, to 1.4-1.6, normal AST/ALT and alk phos 150s  in early 11/2015.    Is now set up for 12/04/2015 screening colonoscopy and EGD   Had first episode of hematochezia on Monday 3/20: large volume, smaller volumes on Tuesday and Wednesday.  No change in abdominal pain.  Persistent poor appetite but po does not increase pain. No N/V.  Hgb stable at 11.2, within historic normal range.  Platelets in 70s, 80s: also in normal range for him.  PT up to 17.2, INR 1.3.  Alk phos 141, AST/alt 58/17, t bili 1.8.  11/28/15 CT scan: Passive hepatic congestion from heart failure.  HSM.  Reactive abdominal lymph nodes likely related to liver disease. Moderate diffuse abdominal and pelvic free fluid consistent with ascites  Past Medical History  Diagnosis Date  . Anemia   . AVF (arteriovenous fistula) (HCC)     Left  . Secondary hyperparathyroidism (HCC)   . Hypovitaminosis D   . Hypertensive urgency     H/o  . CHF (congestive heart failure) (HCC)       EF 20-25%  . Exertional shortness of breath     "related to infection in my lungs right now" (05/03/2013)  . History of gout     "before I started doing the dialysis" (05/03/2013)  . Sleep apnea   . GERD (gastroesophageal reflux disease)   . Syncope     felt secondary to residual anesthesia the day before - 2D echo unremarkable  . Pulmonary embolism (HCC)     with right DVT secondary to recent surgery  . Atrial fibrillation (HCC)     not on coumadin due to large spontaneous hematoma on back and anemia  . Myocardial infarction (HCC) 90's    . Coronary artery disease   . Dysrhythmia     afib  . Peripheral vascular disease (HCC)     dvt leg 12/14  . Hypertension   . ESRD (end stage renal disease) on dialysis (HCC)     [facility] mon/wed/fri  . Atrial flutter (HCC) 04/27/2013  . S/P repair of ventral hernia 03/21/2014  . Hereditary and idiopathic peripheral neuropathy 08/29/2015  . Diverticulosis   . Hepatosplenomegaly 10/18/2015    Workup per Dr Kaplan/ GI in Dec 2015 > abd pain improved after paracentesis x 2 (1.2L, 1.3L). Had negative hepA/ hep B/ hep C testing. For hepatosplenomagealy pt underwent transjugular liver biopsy by IR with hepatic vein wedge pressure measurement which showed elevated right heart and free hepatic venous pressures likely due to right heart failure.  There was no evidence of portal hypertension. Liver bx originally was reported as "End stage liver disease/ cirrhosis", then was corrected > "No evidence of cirrhosis".  The corrected liver biopsy result reads "SINUSOIDAL DILATATION WITH SCATTERED FOCI OF HEPATITIS".  Last GI visit was Aug 2016 for bloating/ abd pain, noted he had a normal gastric empty study.  -Right lobe liver lesion. Felt to be hemangioma, Biopsy pending.  -thrombocytopenia. Dates back to 08/2013.  -ESRD. MWF HD.  -biventricular heart failure, acute right sided heart failure. Daily HD to address volume overload.    . Acute on chronic systolic right heart failure (HCC) 11/14/2015  . Elevated troponin 11/14/2015  . Renal insufficiency     Past Surgical History  Procedure Laterality Date  . Av fistula placement Left     Dr. Feilds; "I've had 2 on the left' (05/03/2013)  . Av fistula placement Right ~ 2011  . Knee arthroscopy Left   . Avgg removal Right 05/04/2013    Procedure: REMOVAL OF ARTERIOVENOUS Fistula Right Arm;  Surgeon: Charles E Fields, MD;  Location: [facility] OR;  Service: Vascular;  Laterality: Right;  . Insertion of dialysis catheter Right 05/04/2013    Procedure: INSERTION OF DIALYSIS  CATHETER;  Surgeon: Charles E Fields, MD;  Location: [facility] OR;  Service: Vascular;  Laterality: Right;  . Colonoscopy  10-29-2005    Hx: of  . Bascilic vein transposition Left 06/27/2013    Procedure: BASCILIC VEIN TRANSPOSITION;  Surgeon: Charles E Fields, MD;  Location: [facility] OR;  Service: Vascular;  Laterality: Left;  . Cardioversion N/A 08/29/2013    Procedure: CARDIOVERSION;  Surgeon: Traci R [name], MD;  Location: [facility] ENDOSCOPY;  Service: Cardiovascular;  Laterality: N/A;  . Removal of a dialysis catheter  2/15  . Hernia repair  03/21/14    Umbilical hernia-Dr. Ramirez  . Umbilical hernia repair N/A 03/21/2014    Procedure: LAPAROSCOPIC UMBILICAL HERNIA REPAIR WITH MESH;  Surgeon: Armando Ramirez, MD;  Location: [facility] OR;  Service: General;  Laterality: N/A;  . Insertion   of mesh N/A 03/21/2014    Procedure: INSERTION OF MESH;  Surgeon: Armando Ramirez, MD;  Location: [facility] OR;  Service: General;  Laterality: N/A;  . Venogram Left 05/26/2013    Procedure: VENOGRAM;  Surgeon: Brian L Chen, MD;  Location: [facility] CATH LAB;  Service: Cardiovascular;  Laterality: Left;  . Cardiac catheterization N/A 11/15/2015    Procedure: Right Heart Cath;  Surgeon: Thomas A Kelly, MD;  Location: [facility] INVASIVE CV LAB;  Service: Cardiovascular;  Laterality: N/A;  . Cardiac catheterization Right 11/15/2015    Procedure: Left Heart Cath;  Surgeon: Thomas A Kelly, MD;  Location: [facility] INVASIVE CV LAB;  Service: Cardiovascular;  Laterality: Right;    Prior to Admission medications   Medication Sig Start Date End Date Taking? Authorizing Provider  aspirin EC 81 MG EC tablet Take 1 tablet (81 mg total) by mouth daily. 11/17/15  Yes Daniel Thompson V, MD  B Complex-C (B-COMPLEX WITH VITAMIN C) tablet Take 1 tablet by mouth daily.   Yes Historical Provider, MD  labetalol (NORMODYNE) 200 MG tablet Take 200 mg by mouth 2 (two) times daily.   Yes Historical Provider, MD  Nutritional Supplements (FEEDING SUPPLEMENT, NEPRO CARB STEADY,) LIQD Take  237 mLs by mouth 2 (two) times daily between meals. 10/19/15  Yes Nishant Dhungel, MD  oxyCODONE-acetaminophen (PERCOCET/ROXICET) 5-325 MG tablet Take 1 tablet by mouth every 6 (six) hours as needed. Pain 10/29/15  Yes Historical Provider, MD  pantoprazole (PROTONIX) 40 MG tablet Take 1 tablet (40 mg total) by mouth 2 (two) times daily before a meal. 11/17/15  Yes Daniel Thompson V, MD  sevelamer carbonate (RENVELA) 800 MG tablet Take 4 tablets (3,200 mg total) by mouth 3 (three) times daily with meals. Patient taking differently: Take 2,400 mg by mouth 3 (three) times daily with meals.  09/12/14  Yes Marianne L York, PA-C  Alum & Mag Hydroxide-Simeth (GI COCKTAIL) SUSP suspension Take 30 mLs by mouth 3 (three) times daily as needed for indigestion. Shake well. 11/17/15   Daniel Thompson V, MD  carvedilol (COREG) 3.125 MG tablet Take 1 tablet (3.125 mg total) by mouth 2 (two) times daily with a meal. 11/17/15   Daniel Thompson V, MD  nitroGLYCERIN (NITROSTAT) 0.4 MG SL tablet Place 1 tablet (0.4 mg total) under the tongue every 5 (five) minutes as needed for chest pain. 11/17/15   Daniel Thompson V, MD    Scheduled Meds: . B-complex with vitamin C  1 tablet Oral Daily  . carvedilol  3.125 mg Oral BID WC  . oseltamivir  30 mg Oral Daily  . pantoprazole  40 mg Oral BID AC  . sevelamer carbonate  2,400 mg Oral TID WC   Infusions:   PRN Meds: acetaminophen **OR** acetaminophen, iohexol, iohexol, ondansetron **OR** ondansetron (ZOFRAN) IV, oxyCODONE-acetaminophen   Allergies as of 11/28/2015 - Review Complete 11/28/2015  Allergen Reaction Noted  . Pork-derived products Nausea And Vomiting 03/14/2014    Family History  Problem Relation Age of Onset  . Hypertension Father   . Kidney disease Father   . Allergies Father   . Deep vein thrombosis Sister   . Pulmonary embolism Sister   . Diabetes Paternal Grandmother     Social History   Social History  . Marital Status: Significant Other     Spouse Name: N/A  . Number of Children: 4  . Years of Education: N/A   Occupational History  . Disabled    Social History Main Topics  . Smoking status: Former   Smoker -- 0.25 packs/day for .5 years    Types: Cigarettes    Quit date: 09/08/1972  . Smokeless tobacco: Never Used  . Alcohol Use: No     Comment: 05/03/2013 "haven't had a glass of wine in ~ 3 months or so; sometimes will have one w/dinner"  . Drug Use: No  . Sexual Activity: Yes    Birth Control/ Protection: None   Other Topics Concern  . Not on file   Social History Narrative   Former smoker- quit over 30 yrs ago   Patient is on disability    REVIEW OF SYSTEMS: Constitutional:  generraly weak, no change ENT:  No nose bleeds Pulm:  Chronic cough CV:  No palpitations, no LE edema.  GU:  No hematuria, no frequency GI:  Per HPI.   Heme:  No unusual bleeding or bruising except Gi this week   Transfusions:  None.  Neuro:  No headaches, no peripheral tingling or numbness Derm:  No itching, no rash or sores.  Endocrine:  No sweats or chills.  No polyuria or dysuria Immunization:  Not queried.  Travel:  None beyond local counties in last few months.    PHYSICAL EXAM: Vital signs in last 24 hours: Filed Vitals:   11/28/15 2153 11/29/15 0655  BP: 100/78 114/77  Pulse: 85 98  Temp: 98.1 F (36.7 C) 100 F (37.8 C)  Resp: 18 22   Wt Readings from Last 3 Encounters:  11/28/15 96.8 kg (213 lb 6.5 oz)  11/17/15 95.346 kg (210 lb 3.2 oz)  10/30/15 97.07 kg (214 lb)    General: cooperative, uncomfortable, chronically ill looking AAM Head:  No asymmetry or swelling  Eyes:  No pallor or icterus Ears:  Slightly HOH  Nose:  No discharge Mouth:  Clear, moist.   Neck:  No mass .  No JVD.  No TMG Lungs:  Very poor BS, no adventitious.  Heart: RRR.  No MRG Abdomen:  Soft, hypoactive BS.  Slight distention and tension.  Tender all over upper and mid abdomen, especially in RUQ.  + hepatomegaly.   Rectal: deferred     Musc/Skeltl: no joint redness, swellilng or contracture Extremities:  No CCE.  Vascular prominence of veins in hands  Neurologic:  Oriented x 3.  No gross weakness, tremor or deficits Skin:  Healed sores on his back/trunk  Tattoos:  none   Psych:  Flat affect, cooperative.   Intake/Output from previous day:   Intake/Output this shift:    LAB RESULTS:  Recent Labs  11/28/15 1507 11/29/15 0639  WBC 1.9* 2.0*  HGB 11.2* 11.2*  HCT 33.0* 33.9*  PLT 82* 72*   BMET Lab Results  Component Value Date   NA 134* 11/29/2015   NA 133* 11/28/2015   NA 136 11/17/2015   K 4.3 11/29/2015   K 4.5 11/28/2015   K 4.2 11/17/2015   CL 95* 11/29/2015   CL 97* 11/28/2015   CL 96* 11/17/2015   CO2 25 11/29/2015   CO2 26 11/28/2015   CO2 28 11/17/2015   GLUCOSE 52* 11/29/2015   GLUCOSE 75 11/28/2015   GLUCOSE 114* 11/17/2015   BUN 25* 11/29/2015   BUN 17 11/28/2015   BUN 17 11/17/2015   CREATININE 6.15* 11/29/2015   CREATININE 4.99* 11/28/2015   CREATININE 6.05* 11/17/2015   CALCIUM 8.7* 11/29/2015   CALCIUM 8.3* 11/28/2015   CALCIUM 9.4 11/17/2015   LFT  Recent Labs  11/28/15 1507  PROT 6.9  ALBUMIN 2.9*    AST 58*  ALT 17  ALKPHOS 141*  BILITOT 1.8*   PT/INR Lab Results  Component Value Date   INR 1.39 11/15/2015   INR 1.47 11/14/2015   INR 1.30 09/05/2014   Hepatitis Panel No results for input(s): HEPBSAG, HCVAB, HEPAIGM, HEPBIGM in the last 72 hours. C-Diff No components found for: CDIFF Lipase     Component Value Date/Time   LIPASE 38 11/14/2015 0345    Drugs of Abuse     Component Value Date/Time   LABOPIA NONE DETECTED 12/11/2007 2233   COCAINSCRNUR NONE DETECTED 12/11/2007 2233   LABBENZ NONE DETECTED 12/11/2007 2233   AMPHETMU NONE DETECTED 12/11/2007 2233   THCU NONE DETECTED 12/11/2007 2233   LABBARB  12/11/2007 2233    NONE DETECTED        DRUG SCREEN FOR MEDICAL PURPOSES ONLY.  IF CONFIRMATION IS NEEDED FOR ANY PURPOSE, NOTIFY  LAB WITHIN 5 DAYS.     RADIOLOGY STUDIES: Dg Chest 2 View  11/28/2015  CLINICAL DATA:  Cough, fever, congestion, and sore throat for 3 days. EXAM: CHEST  2 VIEW COMPARISON:  Chest CTA 11/14/2015 and radiographs 11/13/2015 FINDINGS: The cardiac silhouette remains moderately enlarged. A tortuous, calcified thoracic aorta is again seen. Pulmonary vascular congestion and perihilar and bibasilar opacities are similar to the prior radiographs. No pleural effusion or pneumothorax is identified. No acute osseous abnormality is identified. IMPRESSION: Cardiomegaly with pulmonary vascular congestion and mild edema, unchanged from prior radiographs. Electronically Signed   By: Allen  Grady M.D.   On: 11/28/2015 16:03   Ct Abdomen Pelvis W Contrast  11/28/2015  CLINICAL DATA:  Abdominal pain beginning after umbilical hernia surgery. History of end-stage renal disease. EXAM: CT ABDOMEN AND PELVIS WITH CONTRAST TECHNIQUE: Multidetector CT imaging of the abdomen and pelvis was performed using the standard protocol following bolus administration of intravenous contrast. CONTRAST:  100mL OMNIPAQUE IOHEXOL 300 MG/ML  SOLN COMPARISON:  10/14/2015 FINDINGS: Small bilateral pleural effusions with basilar atelectasis. Peribronchial thickening with mild peribronchial infiltration suggesting acute or chronic bronchitis. Calcified granulomas in the right lung with calcified lymph nodes in the right hilum and mediastinum. Coronary artery calcifications. Cardiac enlargement with prominent right heart enlargement and left atrial enlargement. Calcified lymph nodes at the EG junction. Hepatic enlargement. Sub cm low-attenuation lesion in segment 5 likely represents a cyst. No change since previous study. Retrograde flow of contrast material into the hepatic veins and IVC consistent with passive congestion due to right heart failure. Spleen is enlarged without focal lesion demonstrated. Moderate diffuse free fluid throughout the abdomen  and pelvis likely represents ascites. Prominent lymph nodes in the celiac access and porta hepatis are probably reactive and may be related to liver disease. Mild prominence of retroperitoneal lymph nodes without pathologic enlargement. Gallbladder is contracted with thickened wall, likely physiologic. Pancreas, adrenal glands, inferior vena cava are normal. Bilateral renal parenchymal atrophy with cysts demonstrated in the kidneys. No hydronephrosis. Delayed nephrograms. Calcification of the abdominal aorta without aneurysm. Calcification of the celiac axis and superior mesenteric artery. Stomach, small bowel, and colon are not abnormally distended. Contrast material flows through to the rectum suggesting no evidence of large or small bowel obstruction. No free air in the abdomen. Pelvis: Bladder is decompressed. Prostate gland is enlarged, measuring 4.7 cm diameter. Moderately prominent lymph nodes in the groin regions are likely reactive. Degenerative changes in the spine. No destructive bone lesions. IMPRESSION: Cardiac enlargement with passive congestion of contrast material into the hepatic veins and IVC. Hepatic splenomegaly. Reactive   abdominal lymph nodes likely related to liver disease. Moderate diffuse abdominal and pelvic free fluid consistent with ascites. Atrophic kidneys with delayed nephrograms consistent with history of renal failure. Granulomatous changes in the chest. Small pleural effusions with basilar atelectasis and bronchitic changes. Electronically Signed   By: William  Stevens M.D.   On: 11/28/2015 22:57    ENDOSCOPIC STUDIES: Per HPI  IMPRESSION:   *  Acute hematochezia.  Rule out diverticular bleeding.  No colitis on CT, so less likely ischemic colitis.  The bleeding has not caused drop in Hgb.     *  Unchanged chronic abdominal pain, present since 01/2014 umbilical hernia repair.  This may be due to the passive hepatic congestion from heart failure but still has GB so wonder if a  HIDA scan might be helpful in ruling in/out GB source of sxs.  LFTs are mildly elevated which may be secondary to liver congestion.  Elevated alk phos in ESRD pt is the norm.    *  ESRD  *  CHF.     PLAN:     *  Colonoscopy, EGD tomorrow to further evaluate hematochezia and abdominal pain.    Sarah Gribbin  11/29/2015, 8:50 AM Pager: [phone]     Attending physician's note   I have taken a history, examined the patient and reviewed the chart. I agree with the Advanced Practitioner's note, impression and recommendations.  Hematochezia, suspected LGI source and chronic abdominal pain. Further evaluation with colonoscopy and EGD tomorrow. The risks (including bleeding, perforation, infection, missed lesions, medication reactions and possible hospitalization or surgery if complications occur), benefits, and alternatives to colonoscopy with possible biopsy and possible polypectomy were discussed with the patient and they consent to proceed. The risks (including bleeding, perforation, infection, missed lesions, medication reactions and possible hospitalization or surgery if complications occur), benefits, and alternatives to endoscopy with possible biopsy and possible dilation were discussed with the patient and they consent to proceed. He is at higher risk from sedation and endoscopic procedures due to biventricular heart failure. Severe passive hepatic congestion with ascites from biventricular heart failure is the cause of abnl LFTs and is the likely cause of chronic abdominal pain.   Malcolm Stark, MD FACG [phone] Mon-Fri 8a-5p [phone] after 5p, weekends, holidays     

## 2015-11-30 NOTE — Anesthesia Postprocedure Evaluation (Signed)
Anesthesia Post Note  Patient: Trevor [name]  Procedure(s) Performed: Procedure(s) (LRB): COLONOSCOPY (N/A) ESOPHAGOGASTRODUODENOSCOPY (EGD) (N/A)  Patient location during evaluation: PACU Anesthesia Type: MAC Level of consciousness: awake and alert Pain management: pain level controlled Vital Signs Assessment: post-procedure vital signs reviewed and stable Respiratory status: spontaneous breathing, nonlabored ventilation, respiratory function stable and patient connected to nasal cannula oxygen Cardiovascular status: stable and blood pressure returned to baseline Anesthetic complications: no Comments: Patient hypotensive initially when awakening from anesthesia however not < 20% below baseline, improving respiratory status, asymptomatic with no chest pain or dizzyness.. Doing better after initial admission to pacu    Last Vitals:  Filed Vitals:   11/30/15 1125 11/30/15 1130  BP: 95/74   Pulse: 87 83  Temp:    Resp: 32 23    Last Pain:  Filed Vitals:   11/30/15 1130  PainSc: 0-No pain                 Reino Kent

## 2015-11-30 NOTE — Anesthesia Preprocedure Evaluation (Signed)
Anesthesia Evaluation  Patient identified by MRN, date of birth, ID band Patient awake    Reviewed: Allergy & Precautions, NPO status , Patient's Chart, lab work & pertinent test results  Airway Mallampati: I  TM Distance: >3 FB Neck ROM: Full    Dental  (+) Teeth Intact, Partial Upper, Dental Advisory Given   Pulmonary neg sleep apnea (Pt denies having sleep apnea problems), former smoker,    breath sounds clear to auscultation       Cardiovascular hypertension, Pt. on medications + CAD, + Past MI and +CHF   Rhythm:Regular Rate:Normal     Neuro/Psych    GI/Hepatic GERD  Medicated,  Endo/Other    Renal/GU DialysisRenal disease     Musculoskeletal   Abdominal   Peds  Hematology   Anesthesia Other Findings   Reproductive/Obstetrics                             Anesthesia Physical Anesthesia Plan  ASA: III  Anesthesia Plan: MAC   Post-op Pain Management:    Induction: Intravenous  Airway Management Planned: Simple Face Mask  Additional Equipment:   Intra-op Plan:   Post-operative Plan:   Informed Consent:   Dental advisory given  Plan Discussed with: Anesthesiologist, CRNA and Surgeon  Anesthesia Plan Comments:         Anesthesia Quick Evaluation

## 2015-11-30 NOTE — Op Note (Signed)
[facility] Patient Name: Trevor [name] Procedure Date : 11/30/2015 MRN: [id number] Attending MD: Meryl Dare , MD Date of Birth: 05/12/1951 CSN: [id number] Age: 65 Admit Type: Inpatient Procedure:                Upper GI endoscopy Indications:              Abdominal pain in the right upper quadrant Providers:                Venita Lick. Russella Dar, MD, Dwain Sarna, RN, Lorenda Ishihara, Technician, Orvilla Fus, CRNA Referring MD:             [facility] Medicines:                Monitored Anesthesia Care Complications:            No immediate complications. Estimated Blood Loss:     Estimated blood loss was minimal. Procedure:                Pre-Anesthesia Assessment:                           - Prior to the procedure, a History and Physical                            was performed, and patient medications and                            allergies were reviewed. The patient's tolerance of                            previous anesthesia was also reviewed. The risks                            and benefits of the procedure and the sedation                            options and risks were discussed with the patient.                            All questions were answered, and informed consent                            was obtained. Prior Anticoagulants: The patient has                            taken no previous anticoagulant or antiplatelet                            agents. ASA Grade Assessment: III - A patient with                            severe systemic disease. After reviewing the risks  and benefits, the patient was deemed in                            satisfactory condition to undergo the procedure.                           - Prior to the procedure, a History and Physical                            was performed, and patient medications and                            allergies were reviewed. The patient's  tolerance of                            previous anesthesia was also reviewed. The risks                            and benefits of the procedure and the sedation                            options and risks were discussed with the patient.                            All questions were answered, and informed consent                            was obtained. Prior Anticoagulants: The patient has                            taken no previous anticoagulant or antiplatelet                            agents. ASA Grade Assessment: III - A patient with                            severe systemic disease. After reviewing the risks                            and benefits, the patient was deemed in                            satisfactory condition to undergo the procedure.                           After obtaining informed consent, the endoscope was                            passed under direct vision. Throughout the                            procedure, the patient's blood pressure, pulse, and  oxygen saturations were monitored continuously. The                            EG-[id number] ([id number]) scope was introduced through the                            mouth, and advanced to the second part of duodenum.                            The upper GI endoscopy was accomplished without                            difficulty. The patient tolerated the procedure                            fairly well. Scope In: Scope Out: Findings:      Diffuse mild inflammation characterized by congestion (edema), erythema       and granularity was found in the gastric fundus and in the gastric body.       Biopsies were taken with a cold forceps for histology.      The exam of the stomach was otherwise normal.      The cardia and gastric fundus were otherwise normal on retroflexion.      The examined esophagus was normal.      The duodenal bulb and second portion of the duodenum were  normal. Impression:               - Gastritis. Biopsied.                           - Otherwise normal exam. Moderate Sedation:      none Recommendation:           - Await pathology results.                           - Return patient to hospital ward for ongoing care.                           - Resume previous diet.                           - PPI qam                           - Continue present medications. Procedure Code(s):        --- Professional ---                           [phone], Esophagogastroduodenoscopy, flexible,                            transoral; with biopsy, single or multiple Diagnosis Code(s):        --- Professional ---                           K29.70, Gastritis, unspecified, without bleeding  R10.11, Right upper quadrant pain CPT copyright 2016 [employer]. All rights reserved. The codes documented in this report are preliminary and upon coder review may  be revised to meet current compliance requirements. Venita Lick. Russella Dar, MD Meryl Dare, MD 11/30/2015 11:15:54 AM This report has been signed electronically. Number of Addenda: 0

## 2015-11-30 NOTE — Progress Notes (Signed)
[name]                                            Patient Demographics:    Trevor [name], is a 65 y.o. male, DOB - Aug 03, 1951, YHC:[id number]  Admit date - 11/28/2015   Admitting Physician Domenic Polite, MD  Outpatient Primary MD for the patient is Maximino Greenland, MD  LOS - 2  Chief Complaint  Patient presents with  . Abdominal Pain, rectal bleeding        Subjective:  Still with abd pain, no rectal bleeding   Assessment  & Plan :   Lower GI bleed/chronic abdominal pain -Ct abd pelvis without evidence of ischemic colitis, bleeding subsiding, Hb stable -Hold aspirin -[facility] consulted , endoscopic workup was being planned prior to this admission  -colonoscopy in 2007 with diverticulosis -EGD and Colonoscopy with gastritis and internal hemorrhoids -will d/w GI regarding CTA to r/o chronic intestinal ischemia  Influenza B -continue Tamiflu even though symptoms started >48hours prior to admission -FU Blood Cx   ESRD on hemodialysis ([place]) -Will notify renal of admission    DCM- EF 25-30% by echo 09/04/14 -Volume managed with dialysis, resume Coreg -resume labetalol   Abdominal pain, chronic, epigastric -As above -last admission, had an Abdominal ultrasound with nodular hepatic contour and splenomegaly with findings consistent with chronic liver disease. CT abdomen and pelvis on 10/14/2015 with chronic granulomatous disease with adenopathy and splenomegaly and volume overload. Patient had a prior liver biopsy December 2015 which showed sinusoidal dilatation and scattered foci hepatitis and negative for cirrhosis then   Chronic atrial fibrillation ([place]) -CHADS2VASC is 3  -continue coreg, rate controlled. 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 Discontinued secondary to spontaneous back hematoma. Patient has refused anticoagulation since  DVT Prophylaxis- SCDs  Family Communication: no family at bedside, plan d/w pt Code Status Full Code DC to Home when stable, pending workup  Consults  : GI, Renal  DVT Prophylaxis  :  SCDs  Lab Results  Component Value Date   PLT 55* 11/30/2015    Anti-infectives    Start     Dose/Rate Route Frequency Ordered Stop   11/30/15 1200  oseltamivir (TAMIFLU) capsule 30 mg     30 mg Oral Every M-W-F (Hemodialysis) 11/29/15 1354 12/05/15 1159   11/28/15 1830  oseltamivir (TAMIFLU) capsule 30 mg  Status:  Discontinued     30 mg Oral Daily 11/28/15 1743 11/29/15 1354        Objective:   Filed Vitals:   11/30/15 1200 11/30/15 1247 11/30/15 1539 11/30/15 1552  BP:  91/61 87/49   Pulse: 90 104 81   Temp: 97.5 F (36.4 C) 98.1 F (36.7 C) 98 F (36.7 C)   TempSrc: Oral Oral Oral   Resp: _0 Weight:   94.4 kg (208 lb 1.8 oz)   SpO2: 94% 97% 85% 93%    Wt Readings from Last 3 Encounters:  11/30/15 94.4 kg (208 lb 1.8 oz)  11/17/15 95.346 kg (210 lb 3.2 oz)  10/30/15 97.07 kg (214 lb)    No intake or output data in the 24 hours ending 11/30/15 1611   Physical Exam  Awake Alert, Oriented X 3, uncomfortable appearing HEENT: [place].AT,PERRAL, Supple Neck,No JVD, No cervical lymphadenopathy appriciated.  Lungs: Symmetrical Chest wall movement, Good air movement bilaterally, CTAB CVA: Systolic murmur, RRR,No Gallops,Rubs  Abd: soft, mild diffuse tenderness, +ve B.Sounds, Abd Soft, hepatosplenomegaly EXt; 1plus edema    Data Review:    CBC  Recent Labs Lab 11/28/15 1507 11/29/15 0639 11/30/15 0727  WBC 1.9*  2.0* 3.5*  HGB 11.2* 11.2* 11.4*  HCT 33.0* 33.9* 33.2*  PLT 82* 72* 55*  MCV 83.1 83.9 83.0  MCH 28.2 27.7 28.5  MCHC 33.9 33.0 34.3  RDW 16.0* 16.0* 16.2*  LYMPHSABS 0.5*  --   --   MONOABS 0.5  --   --   EOSABS 0.0  --   --   BASOSABS 0.0  --   --     Chemistries   Recent Labs Lab 11/28/15 1507 11/29/15 0639 11/30/15 0727  NA 133* 134* 136  K 4.5 4.3 4.5  CL 97* 95* 97*  CO2 _0 GLUCOSE 75 52* 76  BUN 17 25* 41*  CREATININE 4.99* 6.15* 8.77*  CALCIUM 8.3* 8.7* 8.8*  AST 58*  --   --   ALT 17  --   --   ALKPHOS 141*  --   --   BILITOT 1.8*  --   --    ------------------------------------------------------------------------------------------------------------------ No results for input(s): CHOL, HDL, LDLCALC, TRIG, CHOLHDL, LDLDIRECT in the last 72 hours.  Lab Results  Component Value Date   HGBA1C 5.4 11/14/2015   ------------------------------------------------------------------------------------------------------------------ No results for input(s): TSH, T4TOTAL, T3FREE, THYROIDAB in the last 72 hours.  Invalid input(s): FREET3 ------------------------------------------------------------------------------------------------------------------ No results for input(s): VITAMINB12, FOLATE, FERRITIN, TIBC, IRON, RETICCTPCT in the last 72 hours.  Coagulation profile No results for input(s): INR, PROTIME in the last 168 hours.  No results for input(s): DDIMER in the last 72 hours.  Cardiac Enzymes No results for input(s): CKMB, TROPONINI, MYOGLOBIN in the last 168 hours.  Invalid input(s): CK ------------------------------------------------------------------------------------------------------------------    Component Value Date/Time   BNP 1959.9* 08/30/2014 0402    Inpatient Medications  Scheduled Meds: . B-complex with vitamin C  1 tablet Oral Daily  . carvedilol  3.125 mg Oral BID WC  . feeding supplement (PRO-STAT SUGAR FREE 64)  30 mL Oral  BID  . labetalol  200 mg Oral BID  . oseltamivir  30 mg Oral Q M,W,F-HD  . pantoprazole  40 mg Oral BID AC  . peg 3350 powder  0.5 kit Oral Once  . sevelamer carbonate  2,400 mg Oral TID WC   Continuous Infusions: . sodium chloride     PRN Meds:.sodium chloride, sodium chloride, acetaminophen **OR** acetaminophen, iohexol, iohexol, lidocaine (PF), lidocaine-prilocaine, morphine injection, ondansetron **OR** ondansetron (ZOFRAN) IV, oxyCODONE-acetaminophen, pentafluoroprop-tetrafluoroeth  Micro Results No results found for this or any previous visit (from the past 240 hour(s)).  Radiology Reports Dg Chest 2 View  11/28/2015  CLINICAL DATA:  Cough, fever, congestion, and sore throat for 3 days. EXAM: CHEST  2 VIEW COMPARISON:  Chest CTA 11/14/2015 and radiographs 11/13/2015 FINDINGS: The cardiac silhouette remains moderately enlarged. A tortuous, calcified thoracic aorta is again seen. Pulmonary vascular congestion and perihilar and bibasilar opacities are similar to the prior radiographs. No pleural effusion or pneumothorax is identified. No acute osseous abnormality is identified. IMPRESSION: Cardiomegaly with pulmonary vascular  congestion and mild edema, unchanged from prior radiographs. Electronically Signed   By: Logan Bores M.D.   On: 11/28/2015 16:03   Dg Chest 2 View  11/13/2015  CLINICAL DATA:  Chest pain and shortness of breath today, cough for 1 month, hypertension, CHF, gout, GERD, prior pulmonary embolism, coronary artery disease post MI, atrial fibrillation/flutter, end-stage renal disease on dialysis EXAM: CHEST  2 VIEW COMPARISON:  10/31/2015 FINDINGS: Enlargement of cardiac silhouette with pulmonary vascular congestion. Calcified tortuous aorta. Calcified RIGHT paratracheal lymph nodes again seen. Interstitial infiltrates in the perihilar regions likely reflecting mild chronic CHF. No segmental consolidation, pleural effusion or pneumothorax. Osseous mineralization normal without  acute bony abnormality. IMPRESSION: Enlargement of cardiac silhouette with pulmonary venous hypertension and mild chronic failure. No new abnormalities. Electronically Signed   By: Lavonia Dana M.D.   On: 11/13/2015 17:06   Ct Angio Chest Pe W/cm &/or Wo Cm  11/14/2015  CLINICAL DATA:  Pleuritic chest pain. History of pulmonary embolus and DVT. EXAM: CT ANGIOGRAPHY CHEST WITH CONTRAST TECHNIQUE: Multidetector CT imaging of the chest was performed using the standard protocol during bolus administration of intravenous contrast. Multiplanar CT image reconstructions and MIPs were obtained to evaluate the vascular anatomy. CONTRAST:  1m OMNIPAQUE IOHEXOL 350 MG/ML SOLN COMPARISON:  Chest radiograph 1 day prior.  Chest CT 09/05/2014 FINDINGS: Resolution of previous filling defect in right middle lobe pulmonary artery. No new filling defects or acute pulmonary embolus. Multi chamber cardiomegaly, with prominent right atrial dilatation and significant contrast refluxing into the hepatic veins and IVC. Small pericardial effusion. Thoracic aorta with scattered atherosclerosis. No aneurysm. Coronary artery calcifications are seen. Extensive calcified mediastinal and bilateral hilar adenopathy. Calcified granuloma in the right middle lobe. Heterogeneous lower lobe ground-glass opacities and bronchial thickening. A chronic triangular opacity in the posterior medial right upper lobe adjacent to right pleural thickening/minimal effusion, most consistent with chronic atelectasis. Evaluation of the upper abdomen demonstrates hepatosplenomegaly and small volume perihepatic ascites. Atrophic kidneys. There are no acute or suspicious osseous abnormalities. Increased bone mineral density. Review of the MIP images confirms the above findings. IMPRESSION: 1. No pulmonary embolus. 2. Prominent right heart distension with contrast refluxing into the hepatic veins and IVC consistent with right heart failure. This appears chronic but  advanced in degree. 3. Small pericardial effusion. 4. Multifocal calcified mediastinal adenopathy, sequela of granulomatous disease versus sarcoidosis. 5. Chronic hepatosplenomegaly. Small volume intra-abdominal ascites. Electronically Signed   By: MJeb LeveringM.D.   On: 11/14/2015 03:45   [place]oreaAbdomen Complete  11/14/2015  CLINICAL DATA:  Abdominal distension. EXAM: ABDOMEN ULTRASOUND COMPLETE COMPARISON:  CT 05/02/2016. FINDINGS: Gallbladder: Physiologically distended. Diffuse wall thickening measuring up to 9 mm. No gallstones visualized. No intraluminal sludge. Ring down artifact noted about the fundus. No sonographic Murphy sign noted by sonographer. Small amount of pericholecystic ascites. Common bile duct: Diameter: 3-4 mm. Liver: Hypodense lesion on CT not well seen sonographically. Within normal limits in parenchymal echogenicity. Nodular hepatic contours. Normal directional flow in the main portal vein. IVC: Prominent in diameter. Pancreas: Not well visualized. Spleen: Prominent size measuring 13.2 x 6.4 x 6.2 cm, splenic volume 274 mL. Right Kidney: Length: 8.3 cm. Atrophic with thinning of the renal parenchyma and increased echogenicity, patient with history of chronic renal disease on dialysis. No mass or hydronephrosis visualized. Left Kidney: Length: 6.6 cm. Atrophic with thinning of the renal parenchyma and increased renal echogenicity. Probable cyst arising from the lateral kidney measuring 1.9 cm. No solid mass or  hydronephrosis visualized. Abdominal aorta: No aneurysm visualized. Proximal aorta measures 3 cm, mid and distal aorta obscured. Other findings: Small volume perihepatic ascites. IMPRESSION: 1. Nodular hepatic contours, with diffuse gallbladder wall thickening and mild splenomegaly. Findings consistent with chronic liver disease. Small volume perihepatic ascites. 2. Ring down artifact in the gallbladder wall, may reflect adenomyomatosis. 3. Shrunken echogenic kidneys consistent  chronic renal disease. Electronically Signed   By: Melanie  Ehinger M.D.   On: 11/14/2015 01:22   Ct Abdomen Pelvis W Contrast  11/28/2015  CLINICAL DATA:  Abdominal pain beginning after umbilical hernia surgery. History of end-stage renal disease. EXAM: CT ABDOMEN AND PELVIS WITH CONTRAST TECHNIQUE: Multidetector CT imaging of the abdomen and pelvis was performed using the standard protocol following bolus administration of intravenous contrast. CONTRAST:  100mL OMNIPAQUE IOHEXOL 300 MG/ML  SOLN COMPARISON:  10/14/2015 FINDINGS: Small bilateral pleural effusions with basilar atelectasis. Peribronchial thickening with mild peribronchial infiltration suggesting acute or chronic bronchitis. Calcified granulomas in the right lung with calcified lymph nodes in the right hilum and mediastinum. Coronary artery calcifications. Cardiac enlargement with prominent right heart enlargement and left atrial enlargement. Calcified lymph nodes at the EG junction. Hepatic enlargement. Sub cm low-attenuation lesion in segment 5 likely represents a cyst. No change since previous study. Retrograde flow of contrast material into the hepatic veins and IVC consistent with passive congestion due to right heart failure. Spleen is enlarged without focal lesion demonstrated. Moderate diffuse free fluid throughout the abdomen and pelvis likely represents ascites. Prominent lymph nodes in the celiac access and porta hepatis are probably reactive and may be related to liver disease. Mild prominence of retroperitoneal lymph nodes without pathologic enlargement. Gallbladder is contracted with thickened wall, likely physiologic. Pancreas, adrenal glands, inferior vena cava are normal. Bilateral renal parenchymal atrophy with cysts demonstrated in the kidneys. No hydronephrosis. Delayed nephrograms. Calcification of the abdominal aorta without aneurysm. Calcification of the celiac axis and superior mesenteric artery. Stomach, small bowel, and  colon are not abnormally distended. Contrast material flows through to the rectum suggesting no evidence of large or small bowel obstruction. No free air in the abdomen. Pelvis: Bladder is decompressed. Prostate gland is enlarged, measuring 4.7 cm diameter. Moderately prominent lymph nodes in the groin regions are likely reactive. Degenerative changes in the spine. No destructive bone lesions. IMPRESSION: Cardiac enlargement with passive congestion of contrast material into the hepatic veins and IVC. Hepatic splenomegaly. Reactive abdominal lymph nodes likely related to liver disease. Moderate diffuse abdominal and pelvic free fluid consistent with ascites. Atrophic kidneys with delayed nephrograms consistent with history of renal failure. Granulomatous changes in the chest. Small pleural effusions with basilar atelectasis and bronchitic changes. Electronically Signed   By: William  Stevens M.D.   On: 11/28/2015 22:57    Time Spent in minutes   35min   JOSEPH,PREETHA M.D on 11/30/2015 at 4:11 PM  Between 7am to 7pm - Pager - [phone]  After 7pm go to [email or web address] - password [email or web address]  [facility] -  Office  [phone]       

## 2015-11-30 NOTE — Progress Notes (Signed)
Dr. Gentry Roch just out of room . Pt doing better. Breathing is slowed, O2 sat on 4L is 95%. Bp is starting to come up. Will continue to monitor and have pt take deep breath and cough.  Dr. Gentry Roch good with VSS and status at current time.

## 2015-11-30 NOTE — Progress Notes (Addendum)
Pt is scheduled for colonoscopy in the morning of 11/30/15. Pt has Movie prep scheduled for 1:00am during the night shift. I was told the pt needed prep during the night during change of shift report at about 7:30pm. However, I do not recollect been informed that the pt had missed his 6:00pm dose or to administer the missing dose ASAP after the report. Upon scanning the medication in at 1:00am for the 2nd dose, I realized the previous dose had been skipped. The MD was paged and he asked that the pt be given the missing dose. Pt refused taking the medication, and said he was bloated and that his bowel movement was already clear. Charge nurse attempted to ask the pt to drink the prep, but he still refused. Attempted multiple times. BM is clear yellowish green. BM was viewed by charge nurse as well.  MD informed about the pt's refusal. He advised to inform the pt that the doctors might not be able to help him/ do the procedure, but they would try. Pt was informed. Pt responds "alright, that's good".  Pt refused his AM lab draw.  Pt refused his SCD's

## 2015-11-30 NOTE — Progress Notes (Signed)
[name] Progress Note  Assessment/Plan: 1. Lower GI bleed: GI consulted. Colonoscopy today per Dr. Cristobal Goldmann gastritis otherwise normal. Patient states he was told that he has hemorrhoids-not in Dr. Silvio Pate note. HGB stable.  2. Influenza B: Per primary. Started on tamiflu-adjusted to renal dose. Tmax 103.2  Afebrile now. Still coughing.   3. ESRD - MWF @ [facility]. Will have HD on schedule today.  No heparin.  4. Hypertension/volume - continue coreg/labetolol. BP controlled. WT 96.8 yesterday slightly below OP EDW however CXR shows vascular congestion. Will attempt 2-2.5 liters UFG today.  5. Anemia - Hgb stable 11.2  6. Metabolic bone disease - resume binders,  continue VDRA 7. Nutrition - Albumin 2.9 Renal diet, renal vit and prostat  8. DCM: per primary  9. Chronic Afib: Per primary, not on anticoagulants. Patient refuses.  10. Pancytopenia: Per primary. Thought to be related to hepatosplenomegaly and splenic sequestration   Rita H. Brown NP-C 11/30/2015, 12:55 PM  [facility] [phone]  Pt seen, examined and agree w A/P as above. No new issues. Feeling better.  Kelly Splinter MD [facility] pager [phone]    cell [phone] 11/30/2015, 2:16 PM    Subjective: "I feel OK-I'm just hungry". Does not remember me visiting at all yesterday. A & O now. Says he feels better.    Objective Filed Vitals:   11/30/15 1150 11/30/15 1155 11/30/15 1200 11/30/15 1247  BP:  101/67  91/61  Pulse: 93 87 90 104  Temp:   97.5 F (36.4 C) 98.1 F (36.7 C)  TempSrc:   Oral Oral  Resp: _0 Weight:      SpO2: 92% 93% 94% 97%   Physical Exam General: Well nourished, NAD Heart: Irregularly, irregular. S1, S2, II/VI systolic M.  Lungs: Coarse breath sounds upper lung fields, no wheezing, no rales Abdomen:active BS. Still distended, slightly tender upper quadrants, no further bloody stools Extremities:No LE edema Dialysis  Access: LUA AVF + bruit  Dialysis: [facility] MWF 4h 33mn F200 500/800 97kg 2/2 bath LUA AVF Heparin none Hectoral 6 mcg IV Q MWF  Additional Objective Labs: Basic Metabolic Panel:  Recent Labs Lab 11/28/15 1507 11/29/15 0639 11/30/15 0727  NA 133* 134* 136  K 4.5 4.3 4.5  CL 97* 95* 97*  CO2 _1 GLUCOSE 75 52* 76  BUN 17 25* 41*  CREATININE 4.99* 6.15* 8.77*  CALCIUM 8.3* 8.7* 8.8*   Liver Function Tests:  Recent Labs Lab 11/28/15 1507  AST 58*  ALT 17  ALKPHOS 141*  BILITOT 1.8*  PROT 6.9  ALBUMIN 2.9*   No results for input(s): LIPASE, AMYLASE in the last 168 hours. CBC:  Recent Labs Lab 11/28/15 1507 11/29/15 0639 11/30/15 0727  WBC 1.9* 2.0* 3.5*  NEUTROABS 0.9*  --   --   HGB 11.2* 11.2* 11.4*  HCT 33.0* 33.9* 33.2*  MCV 83.1 83.9 83.0  PLT 82* 72* 55*   Blood Culture    Component Value Date/Time   SDES PERITONEAL FLUID 09/02/2014 1344   SPECREQUEST NONE 09/02/2014 1344   CULT  09/02/2014 1344    NO GROWTH 3 [place] FINAL 09/02/2014 1344    Cardiac Enzymes: No results for input(s): CKTOTAL, CKMB, CKMBINDEX, TROPONINI in the last 168 hours. CBG:  Recent Labs Lab 11/29/15 1344 11/29/15 1419 11/30/15 0833 11/30/15 0918 11/30/15 1244  GLUCAP 47* 99 67 93 64*   Iron Studies:  No results for input(s): IRON, TIBC, TRANSFERRIN, FERRITIN in the last 72 hours. _0 @ Studies/Results: Dg Chest 2 View  11/28/2015  CLINICAL DATA:  Cough, fever, congestion, and sore throat for 3 days. EXAM: CHEST  2 VIEW COMPARISON:  Chest CTA 11/14/2015 and radiographs 11/13/2015 FINDINGS: The cardiac silhouette remains moderately enlarged. A tortuous, calcified thoracic aorta is again seen. Pulmonary vascular congestion and perihilar and bibasilar opacities are similar to the prior radiographs. No pleural effusion or pneumothorax is identified. No acute osseous abnormality is  identified. IMPRESSION: Cardiomegaly with pulmonary vascular congestion and mild edema, unchanged from prior radiographs. Electronically Signed   By: Logan Bores M.D.   On: 11/28/2015 16:03   Ct Abdomen Pelvis W Contrast  11/28/2015  CLINICAL DATA:  Abdominal pain beginning after umbilical hernia surgery. History of end-stage renal disease. EXAM: CT ABDOMEN AND PELVIS WITH CONTRAST TECHNIQUE: Multidetector CT imaging of the abdomen and pelvis was performed using the standard protocol following bolus administration of intravenous contrast. CONTRAST:  123m OMNIPAQUE IOHEXOL 300 MG/ML  SOLN COMPARISON:  10/14/2015 FINDINGS: Small bilateral pleural effusions with basilar atelectasis. Peribronchial thickening with mild peribronchial infiltration suggesting acute or chronic bronchitis. Calcified granulomas in the right lung with calcified lymph nodes in the right hilum and mediastinum. Coronary artery calcifications. Cardiac enlargement with prominent right heart enlargement and left atrial enlargement. Calcified lymph nodes at the EG junction. Hepatic enlargement. Sub cm low-attenuation lesion in segment 5 likely represents a cyst. No change since previous study. Retrograde flow of contrast material into the hepatic veins and IVC consistent with passive congestion due to right heart failure. Spleen is enlarged without focal lesion demonstrated. Moderate diffuse free fluid throughout the abdomen and pelvis likely represents ascites. Prominent lymph nodes in the celiac access and porta hepatis are probably reactive and may be related to liver disease. Mild prominence of retroperitoneal lymph nodes without pathologic enlargement. Gallbladder is contracted with thickened wall, likely physiologic. Pancreas, adrenal glands, inferior vena cava are normal. Bilateral renal parenchymal atrophy with cysts demonstrated in the kidneys. No hydronephrosis. Delayed nephrograms. Calcification of the abdominal aorta without aneurysm.  Calcification of the celiac axis and superior mesenteric artery. Stomach, small bowel, and colon are not abnormally distended. Contrast material flows through to the rectum suggesting no evidence of large or small bowel obstruction. No free air in the abdomen. Pelvis: Bladder is decompressed. Prostate gland is enlarged, measuring 4.7 cm diameter. Moderately prominent lymph nodes in the groin regions are likely reactive. Degenerative changes in the spine. No destructive bone lesions. IMPRESSION: Cardiac enlargement with passive congestion of contrast material into the hepatic veins and IVC. Hepatic splenomegaly. Reactive abdominal lymph nodes likely related to liver disease. Moderate diffuse abdominal and pelvic free fluid consistent with ascites. Atrophic kidneys with delayed nephrograms consistent with history of renal failure. Granulomatous changes in the chest. Small pleural effusions with basilar atelectasis and bronchitic changes. Electronically Signed   By: WLucienne CapersM.D.   On: 11/28/2015 22:57   Medications:   . B-complex with vitamin C  1 tablet Oral Daily  . carvedilol  3.125 mg Oral BID WC  . labetalol  200 mg Oral BID  . oseltamivir  30 mg Oral Q M,W,F-HD  . pantoprazole  40 mg Oral BID AC  . peg 3350 powder  0.5 kit Oral Once  . sevelamer carbonate  2,400 mg Oral TID WC

## 2015-11-30 NOTE — Care Management Important Message (Signed)
Important Message  Patient Details  Name: RIKI GLADIN MRN: [id number] Date of Birth: Feb 05, 1951   Medicare Important Message Given:  Yes    Lawerance Sabal, RN 11/30/2015, 12:28 PMImportant Message  Patient Details  Name: HAMMIE SPANBAUER MRN: [id number] Date of Birth: [birth year]   Medicare Important Message Given:  Yes    Lawerance Sabal, RN 11/30/2015, 12:28 PM

## 2015-11-30 NOTE — Transfer of Care (Signed)
Immediate Anesthesia Transfer of Care Note  Patient: [name]  Procedure(s) Performed: Procedure(s): COLONOSCOPY (N/A) ESOPHAGOGASTRODUODENOSCOPY (EGD) (N/A)  Patient Location: Endoscopy Unit  Anesthesia Type:MAC  Level of Consciousness: sedated  Airway & Oxygen Therapy: Patient Spontanous Breathing and Patient connected to nasal cannula oxygen  Post-op Assessment: Report given to RN, Post -op Vital signs reviewed and stable and Patient moving all extremities  Post vital signs: Reviewed and stable  Last Vitals:  Filed Vitals:   11/30/15 0641 11/30/15 0911  BP: 98/67 87/59  Pulse: 81 84  Temp: 36.7 C 36.8 C  Resp: 18 28    Complications: No apparent anesthesia complications

## 2015-12-01 LAB — CBC
HEMATOCRIT: 32.3 % — AB (ref 39.0–52.0)
HEMOGLOBIN: 10.8 g/dL — AB (ref 13.0–17.0)
MCH: 27.5 pg (ref 26.0–34.0)
MCHC: 33.4 g/dL (ref 30.0–36.0)
MCV: 82.2 fL (ref 78.0–100.0)
Platelets: 53 10*3/uL — ABNORMAL LOW (ref 150–400)
RBC: 3.93 MIL/uL — AB (ref 4.22–5.81)
RDW: 15.7 % — ABNORMAL HIGH (ref 11.5–15.5)
WBC: 2.9 10*3/uL — ABNORMAL LOW (ref 4.0–10.5)

## 2015-12-01 LAB — GLUCOSE, CAPILLARY
GLUCOSE-CAPILLARY: 85 mg/dL (ref 65–99)
Glucose-Capillary: 104 mg/dL — ABNORMAL HIGH (ref 65–99)
Glucose-Capillary: 84 mg/dL (ref 65–99)

## 2015-12-01 LAB — BASIC METABOLIC PANEL
ANION GAP: 12 (ref 5–15)
BUN: 21 mg/dL — ABNORMAL HIGH (ref 6–20)
CALCIUM: 8.4 mg/dL — AB (ref 8.9–10.3)
CO2: 27 mmol/L (ref 22–32)
Chloride: 96 mmol/L — ABNORMAL LOW (ref 101–111)
Creatinine, Ser: 5.85 mg/dL — ABNORMAL HIGH (ref 0.61–1.24)
GFR, EST AFRICAN AMERICAN: 11 mL/min — AB (ref >60)
GFR, EST NON AFRICAN AMERICAN: 9 mL/min — AB (ref >60)
Glucose, Bld: 84 mg/dL (ref 65–99)
POTASSIUM: 3.6 mmol/L (ref 3.5–5.1)
Sodium: 135 mmol/L (ref 135–145)

## 2015-12-01 NOTE — Progress Notes (Addendum)
[name]                                            Patient Demographics:    [name], is a 65 y.o. male, DOB - [birth year], UXL:[id number]  Admit date - 11/28/2015   Admitting Physician Domenic Polite, MD  Outpatient Primary MD for the patient is Maximino Greenland, MD  LOS - 3  Chief Complaint  Patient presents with  . Abdominal Pain, rectal bleeding        Subjective:  Still with abd pain, no rectal bleeding   Assessment  & Plan :   Lower GI bleed/chronic abdominal pain -Ct abd pelvis without evidence of ischemic colitis, bleeding subsided, Hb stable -Aspirin on hold -[place] [facility] consulted , endoscopic workup was being planned prior to this admission  -colonoscopy in 2007 with diverticulosis -EGD and Colonoscopy with gastritis and internal hemorrhoids -continue PPI  Influenza B -continue Tamiflu even though symptoms started >48hours prior to admission -improving   ESRD on hemodialysis ([place]) -Will notify renal of admission    DCM- EF 25-30% by echo 09/04/14 -Volume managed with dialysis, resume Coreg -resume labetalol   Abdominal pain, chronic, epigastric -As above -last admission, had an Abdominal ultrasound with nodular hepatic contour and splenomegaly with findings consistent with chronic liver disease. CT abdomen and pelvis on 10/14/2015 with chronic granulomatous disease with adenopathy and splenomegaly and volume overload. Patient had a prior liver biopsy December 2015 which showed sinusoidal dilatation and scattered foci hepatitis and negative for cirrhosis then   Chronic atrial fibrillation ([place]) -CHADS2VASC is 3  -continue coreg, rate controlled. 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 Discontinued secondary to spontaneous back hematoma. Patient has refused anticoagulation since  DVT Prophylaxis- SCDs  Family Communication: no family at bedside, plan d/w pt Code Status Full Code DC to Home tomorrow  Consults  : GI, Renal  DVT Prophylaxis  :  SCDs  Lab Results  Component Value Date   PLT 53* 12/01/2015    Anti-infectives    Start     Dose/Rate Route Frequency Ordered Stop   11/30/15 1200  oseltamivir (TAMIFLU) capsule 30 mg     30 mg Oral Every M-W-F (Hemodialysis) 11/29/15 1354 12/05/15 1159   11/28/15 1830  oseltamivir (TAMIFLU) capsule 30 mg  Status:  Discontinued     30 mg Oral Daily 11/28/15 1743 11/29/15 1354        Objective:   Filed Vitals:   11/30/15 1930 11/30/15 1955 11/30/15 2146 12/01/15 0554  BP: 98/67 105/76 101/60 106/77  Pulse: 97 99 105 90  Temp:  97.9 F (36.6 C) 97.8 F (36.6 C) 99.2 F (37.3 C)  TempSrc:  Oral Oral Oral  Resp: _0 Height:   _1  (1.88 m)   Weight:  94.6 kg (208 lb 8.9 oz) 97.1 kg (214 lb 1.1 oz)   SpO2:   96% 95%    Wt Readings from Last 3 Encounters:  11/30/15 97.1 kg (214 lb 1.1 oz)  11/17/15 95.346 kg (210 lb  3.2 oz)  10/30/15 97.07 kg (214 lb)     Intake/Output Summary (Last 24 hours) at 12/01/15 1220 Last data filed at 12/01/15 0550  Gross per 24 hour  Intake    900 ml  Output      0 ml  Net    900 ml     Physical Exam  Awake Alert, Oriented X 3, uncomfortable appearing HEENT: [place].AT,PERRAL, Supple Neck,No JVD, No cervical lymphadenopathy appriciated.  Lungs: Symmetrical Chest wall movement, Good air movement bilaterally, CTAB CVA: Systolic murmur, RRR,No Gallops,Rubs  Abd: soft, mild diffuse tenderness, +ve B.Sounds, Abd Soft, hepatosplenomegaly EXt;  1plus edema    Data Review:    CBC  Recent Labs Lab 11/28/15 1507 11/29/15 0639 11/30/15 0727 11/30/15 1603 12/01/15 0547  WBC 1.9* 2.0* 3.5* 2.9* 2.9*  HGB 11.2* 11.2* 11.4* 11.0* 10.8*  HCT 33.0* 33.9* 33.2* 32.7* 32.3*  PLT 82* 72* 55* 55* 53*  MCV 83.1 83.9 83.0 81.8 82.2  MCH 28.2 27.7 28.5 27.5 27.5  MCHC 33.9 33.0 34.3 33.6 33.4  RDW 16.0* 16.0* 16.2* 15.8* 15.7*  LYMPHSABS 0.5*  --   --   --   --   MONOABS 0.5  --   --   --   --   EOSABS 0.0  --   --   --   --   BASOSABS 0.0  --   --   --   --     Chemistries   Recent Labs Lab 11/28/15 1507 11/29/15 0639 11/30/15 0727 11/30/15 1602 12/01/15 0547  NA 133* 134* 136 132* 135  K 4.5 4.3 4.5 4.5 3.6  CL 97* 95* 97* 96* 96*  CO2 _0 21* 27  GLUCOSE 75 52* 76 91 84  BUN 17 25* 41* 44* 21*  CREATININE 4.99* 6.15* 8.77* 9.12* 5.85*  CALCIUM 8.3* 8.7* 8.8* 8.5* 8.4*  AST 58*  --   --   --   --   ALT 17  --   --   --   --   ALKPHOS 141*  --   --   --   --   BILITOT 1.8*  --   --   --   --    ------------------------------------------------------------------------------------------------------------------ No results for input(s): CHOL, HDL, LDLCALC, TRIG, CHOLHDL, LDLDIRECT in the last 72 hours.  Lab Results  Component Value Date   HGBA1C 5.4 11/14/2015   ------------------------------------------------------------------------------------------------------------------ No results for input(s): TSH, T4TOTAL, T3FREE, THYROIDAB in the last 72 hours.  Invalid input(s): FREET3 ------------------------------------------------------------------------------------------------------------------ No results for input(s): VITAMINB12, FOLATE, FERRITIN, TIBC, IRON, RETICCTPCT in the last 72 hours.  Coagulation profile No results for input(s): INR, PROTIME in the last 168 hours.  No results for input(s): DDIMER in the last 72 hours.  Cardiac Enzymes No results for input(s): CKMB, TROPONINI, MYOGLOBIN in the last  168 hours.  Invalid input(s): CK ------------------------------------------------------------------------------------------------------------------    Component Value Date/Time   BNP 1959.9* 08/30/2014 0402    Inpatient Medications  Scheduled Meds: . B-complex with vitamin C  1 tablet Oral Daily  . carvedilol  3.125 mg Oral BID WC  . feeding supplement (PRO-STAT SUGAR FREE 64)  30 mL Oral BID  . oseltamivir  30 mg Oral Q M,W,F-HD  . pantoprazole  40 mg Oral BID AC  . peg 3350 powder  0.5 kit Oral Once  . sevelamer carbonate  2,400 mg Oral TID WC   Continuous Infusions: . sodium chloride     PRN Meds:.acetaminophen **OR** acetaminophen, iohexol,  iohexol, morphine injection, ondansetron **OR** ondansetron (ZOFRAN) IV, oxyCODONE-acetaminophen  Micro Results No results found for this or any previous visit (from the past 240 hour(s)).  Radiology Reports Dg Chest 2 View  11/28/2015  CLINICAL DATA:  Cough, fever, congestion, and sore throat for 3 days. EXAM: CHEST  2 VIEW COMPARISON:  Chest CTA 11/14/2015 and radiographs 11/13/2015 FINDINGS: The cardiac silhouette remains moderately enlarged. A tortuous, calcified thoracic aorta is again seen. Pulmonary vascular congestion and perihilar and bibasilar opacities are similar to the prior radiographs. No pleural effusion or pneumothorax is identified. No acute osseous abnormality is identified. IMPRESSION: Cardiomegaly with pulmonary vascular congestion and mild edema, unchanged from prior radiographs. Electronically Signed   By: Logan Bores M.D.   On: 11/28/2015 16:03   Dg Chest 2 View  11/13/2015  CLINICAL DATA:  Chest pain and shortness of breath today, cough for 1 month, hypertension, CHF, gout, GERD, prior pulmonary embolism, coronary artery disease post MI, atrial fibrillation/flutter, end-stage renal disease on dialysis EXAM: CHEST  2 VIEW COMPARISON:  10/31/2015 FINDINGS: Enlargement of cardiac silhouette with pulmonary vascular  congestion. Calcified tortuous aorta. Calcified RIGHT paratracheal lymph nodes again seen. Interstitial infiltrates in the perihilar regions likely reflecting mild chronic CHF. No segmental consolidation, pleural effusion or pneumothorax. Osseous mineralization normal without acute bony abnormality. IMPRESSION: Enlargement of cardiac silhouette with pulmonary venous hypertension and mild chronic failure. No new abnormalities. Electronically Signed   By: Lavonia Dana M.D.   On: 11/13/2015 17:06   Ct Angio Chest Pe W/cm &/or Wo Cm  11/14/2015  CLINICAL DATA:  Pleuritic chest pain. History of pulmonary embolus and DVT. EXAM: CT ANGIOGRAPHY CHEST WITH CONTRAST TECHNIQUE: Multidetector CT imaging of the chest was performed using the standard protocol during bolus administration of intravenous contrast. Multiplanar CT image reconstructions and MIPs were obtained to evaluate the vascular anatomy. CONTRAST:  63m OMNIPAQUE IOHEXOL 350 MG/ML SOLN COMPARISON:  Chest radiograph 1 day prior.  Chest CT 09/05/2014 FINDINGS: Resolution of previous filling defect in right middle lobe pulmonary artery. No new filling defects or acute pulmonary embolus. Multi chamber cardiomegaly, with prominent right atrial dilatation and significant contrast refluxing into the hepatic veins and IVC. Small pericardial effusion. Thoracic aorta with scattered atherosclerosis. No aneurysm. Coronary artery calcifications are seen. Extensive calcified mediastinal and bilateral hilar adenopathy. Calcified granuloma in the right middle lobe. Heterogeneous lower lobe ground-glass opacities and bronchial thickening. A chronic triangular opacity in the posterior medial right upper lobe adjacent to right pleural thickening/minimal effusion, most consistent with chronic atelectasis. Evaluation of the upper abdomen demonstrates hepatosplenomegaly and small volume perihepatic ascites. Atrophic kidneys. There are no acute or suspicious osseous abnormalities.  Increased bone mineral density. Review of the MIP images confirms the above findings. IMPRESSION: 1. No pulmonary embolus. 2. Prominent right heart distension with contrast refluxing into the hepatic veins and IVC consistent with right heart failure. This appears chronic but advanced in degree. 3. Small pericardial effusion. 4. Multifocal calcified mediastinal adenopathy, sequela of granulomatous disease versus sarcoidosis. 5. Chronic hepatosplenomegaly. Small volume intra-abdominal ascites. Electronically Signed   By: MJeb LeveringM.D.   On: 11/14/2015 03:45   UKoreaAbdomen Complete  11/14/2015  CLINICAL DATA:  Abdominal distension. EXAM: ABDOMEN ULTRASOUND COMPLETE COMPARISON:  CT 05/02/2016. FINDINGS: Gallbladder: Physiologically distended. Diffuse wall thickening measuring up to 9 mm. No gallstones visualized. No intraluminal sludge. Ring down artifact noted about the fundus. No sonographic Murphy sign noted by sonographer. Small amount of pericholecystic ascites. Common bile duct: Diameter: 3-4  mm. Liver: Hypodense lesion on CT not well seen sonographically. Within normal limits in parenchymal echogenicity. Nodular hepatic contours. Normal directional flow in the main portal vein. IVC: Prominent in diameter. Pancreas: Not well visualized. Spleen: Prominent size measuring 13.2 x 6.4 x 6.2 cm, splenic volume 274 mL. Right Kidney: Length: 8.3 cm. Atrophic with thinning of the renal parenchyma and increased echogenicity, patient with history of chronic renal disease on dialysis. No mass or hydronephrosis visualized. Left Kidney: Length: 6.6 cm. Atrophic with thinning of the renal parenchyma and increased renal echogenicity. Probable cyst arising from the lateral kidney measuring 1.9 cm. No solid mass or hydronephrosis visualized. Abdominal aorta: No aneurysm visualized. Proximal aorta measures 3 cm, mid and distal aorta obscured. Other findings: Small volume perihepatic ascites. IMPRESSION: 1. Nodular hepatic  contours, with diffuse gallbladder wall thickening and mild splenomegaly. Findings consistent with chronic liver disease. Small volume perihepatic ascites. 2. Ring down artifact in the gallbladder wall, may reflect adenomyomatosis. 3. Shrunken echogenic kidneys consistent chronic renal disease. Electronically Signed   By: Jeb Levering M.D.   On: 11/14/2015 01:22   Ct Abdomen Pelvis W Contrast  11/28/2015  CLINICAL DATA:  Abdominal pain beginning after umbilical hernia surgery. History of end-stage renal disease. EXAM: CT ABDOMEN AND PELVIS WITH CONTRAST TECHNIQUE: Multidetector CT imaging of the abdomen and pelvis was performed using the standard protocol following bolus administration of intravenous contrast. CONTRAST:  163m OMNIPAQUE IOHEXOL 300 MG/ML  SOLN COMPARISON:  10/14/2015 FINDINGS: Small bilateral pleural effusions with basilar atelectasis. Peribronchial thickening with mild peribronchial infiltration suggesting acute or chronic bronchitis. Calcified granulomas in the right lung with calcified lymph nodes in the right hilum and mediastinum. Coronary artery calcifications. Cardiac enlargement with prominent right heart enlargement and left atrial enlargement. Calcified lymph nodes at the EG junction. Hepatic enlargement. Sub cm low-attenuation lesion in segment 5 likely represents a cyst. No change since previous study. Retrograde flow of contrast material into the hepatic veins and IVC consistent with passive congestion due to right heart failure. Spleen is enlarged without focal lesion demonstrated. Moderate diffuse free fluid throughout the abdomen and pelvis likely represents ascites. Prominent lymph nodes in the celiac access and porta hepatis are probably reactive and may be related to liver disease. Mild prominence of retroperitoneal lymph nodes without pathologic enlargement. Gallbladder is contracted with thickened wall, likely physiologic. Pancreas, adrenal glands, inferior vena cava are  normal. Bilateral renal parenchymal atrophy with cysts demonstrated in the kidneys. No hydronephrosis. Delayed nephrograms. Calcification of the abdominal aorta without aneurysm. Calcification of the celiac axis and superior mesenteric artery. Stomach, small bowel, and colon are not abnormally distended. Contrast material flows through to the rectum suggesting no evidence of large or small bowel obstruction. No free air in the abdomen. Pelvis: Bladder is decompressed. Prostate gland is enlarged, measuring 4.7 cm diameter. Moderately prominent lymph nodes in the groin regions are likely reactive. Degenerative changes in the spine. No destructive bone lesions. IMPRESSION: Cardiac enlargement with passive congestion of contrast material into the hepatic veins and IVC. Hepatic splenomegaly. Reactive abdominal lymph nodes likely related to liver disease. Moderate diffuse abdominal and pelvic free fluid consistent with ascites. Atrophic kidneys with delayed nephrograms consistent with history of renal failure. Granulomatous changes in the chest. Small pleural effusions with basilar atelectasis and bronchitic changes. Electronically Signed   By: WLucienne CapersM.D.   On: 11/28/2015 22:57    Time Spent in minutes   384m   Rayya Yagi M.D on 12/01/2015 at 12:20 PM  Between 7am to 7pm - Pager - [phone]  After 7pm go to [email or web address] - password [facility]  [facility] -  Office  [phone]

## 2015-12-01 NOTE — Progress Notes (Signed)
Called by central tele monitoring to check tele. Checked tele patient removed and stated he was going to take a shower after breakfast (eating now). Central monitoring and nurse aware. Does have order to shower.

## 2015-12-01 NOTE — Progress Notes (Signed)
Patient refused CBG monitoring at 0200.

## 2015-12-01 NOTE — Progress Notes (Addendum)
Patient refused 0600 CBG monitoring.

## 2015-12-01 NOTE — Progress Notes (Signed)
Pt. Refusing to wear tele at this time, pt. Has been educated. Charge nurse spoke with pt. Earlier as well. Will continue to monitor.

## 2015-12-01 NOTE — Progress Notes (Signed)
Tele discontinued, patient refuses to wear it. Patient educated and does not want to wear it. MD notified.

## 2015-12-01 NOTE — Progress Notes (Signed)
[facility] Progress Note  Assessment: 1. Lower GI bleed - colon and EGD showed hemorrhoids and gastritis.   2. Influenza B: on Tamiflu.  Still weak, hoarse, coughing.  3. ESRD - MWF HD 4. Volume - is at new dry wt 97kg 5. HTN - bp's soft, afib on labetalol and coreg. Will dc labetalol 6. Anemia - Hgb stable 11.2  7. Metabolic bone disease - resume binders,  continue VDRA 8. Nutrition - Albumin 2.9 Renal diet, renal vit and prostat  9. DCM: per primary, last ECHO EF 20-25% 10. Chronic Afib: Per primary, not on anticoagulants. Cont coreg for rate control 11. Pancytopenia: Per primary. Thought to be related to hepatosplenomegaly and splenic sequestration 12. Hepatosplenomegaly - due to congestion from CHF  Plan -  DC'd labetalol.  Cont coreg. Next HD Monday.   Kelly Splinter MD [facility] pager [phone]    cell [phone] 12/01/2015, 11:12 AM    Subjective: Still weak, coughing, hoarse.  Wants to take a shower but too fatigued   Objective Filed Vitals:   11/30/15 1930 11/30/15 1955 11/30/15 2146 12/01/15 0554  BP: 98/67 105/76 101/60 106/77  Pulse: 97 99 105 90  Temp:  97.9 F (36.6 C) 97.8 F (36.6 C) 99.2 F (37.3 C)  TempSrc:  Oral Oral Oral  Resp: '20 20 20 18  ' Height:   '6\' 2"'  (1.88 m)   Weight:  94.6 kg (208 lb 8.9 oz) 97.1 kg (214 lb 1.1 oz)   SpO2:   96% 95%   Physical Exam General: Well nourished, NAD Heart: Irregularly, irregular. S1, S2, II/VI systolic M.  Lungs: Coarse breath sounds upper lung fields, no wheezing, no rales Abdomen:active BS. Still distended, slightly tender upper quadrants, no further bloody stools Extremities:No LE edema Dialysis Access: LUA AVF + bruit  Dialysis: [facility] MWF 4h 72mn F200 500/800 97kg 2/2 bath LUA AVF Heparin none Hectoral 6 mcg IV Q MWF  Additional Objective Labs: Basic Metabolic Panel:  Recent Labs Lab 11/30/15 0727 11/30/15 1602 12/01/15 0547  NA 136 132* 135   K 4.5 4.5 3.6  CL 97* 96* 96*  CO2 24 21* 27  GLUCOSE 76 91 84  BUN 41* 44* 21*  CREATININE 8.77* 9.12* 5.85*  CALCIUM 8.8* 8.5* 8.4*  PHOS  --  7.4*  --    Liver Function Tests:  Recent Labs Lab 11/28/15 1507 11/30/15 1602  AST 58*  --   ALT 17  --   ALKPHOS 141*  --   BILITOT 1.8*  --   PROT 6.9  --   ALBUMIN 2.9* 2.9*   No results for input(s): LIPASE, AMYLASE in the last 168 hours. CBC:  Recent Labs Lab 11/28/15 1507 11/29/15 0639 11/30/15 0727 11/30/15 1603 12/01/15 0547  WBC 1.9* 2.0* 3.5* 2.9* 2.9*  NEUTROABS 0.9*  --   --   --   --   HGB 11.2* 11.2* 11.4* 11.0* 10.8*  HCT 33.0* 33.9* 33.2* 32.7* 32.3*  MCV 83.1 83.9 83.0 81.8 82.2  PLT 82* 72* 55* 55* 53*   Blood Culture    Component Value Date/Time   SDES PERITONEAL FLUID 09/02/2014 1344   SPECREQUEST NONE 09/02/2014 1344   CULT  09/02/2014 1344    NO GROWTH 3 DAYS [facility] FINAL 09/02/2014 1344    Cardiac Enzymes: No results for input(s): CKTOTAL, CKMB, CKMBINDEX, TROPONINI in the last 168 hours. CBG:  Recent Labs Lab 11/30/15 1620 11/30/15 2250  12/01/15 12/01/15 0408 12/01/15 0758  GLUCAP 84 87 104* 85 84   Iron Studies: No results for input(s): IRON, TIBC, TRANSFERRIN, FERRITIN in the last 72 hours. '@[email or web address]' @ Studies/Results: No results found. Medications: . sodium chloride     . B-complex with vitamin C  1 tablet Oral Daily  . carvedilol  3.125 mg Oral BID WC  . feeding supplement (PRO-STAT SUGAR FREE 64)  30 mL Oral BID  . labetalol  200 mg Oral BID  . oseltamivir  30 mg Oral Q M,W,F-HD  . pantoprazole  40 mg Oral BID AC  . peg 3350 powder  0.5 kit Oral Once  . sevelamer carbonate  2,400 mg Oral TID WC

## 2015-12-02 ENCOUNTER — Encounter (HOSPITAL_COMMUNITY): Payer: Self-pay | Admitting: Gastroenterology

## 2015-12-02 ENCOUNTER — Inpatient Hospital Stay (HOSPITAL_COMMUNITY): Payer: Medicare Other

## 2015-12-02 MED ORDER — IOPAMIDOL (ISOVUE-300) INJECTION 61%
INTRAVENOUS | Status: AC
  Administered 2015-12-02: 100 mL
  Filled 2015-12-02: qty 100

## 2015-12-02 MED ORDER — IOPAMIDOL (ISOVUE-300) INJECTION 61%
100.0000 mL | Freq: Once | INTRAVENOUS | Status: AC | PRN
  Administered 2015-12-02: 100 mL via INTRAVENOUS

## 2015-12-02 NOTE — Progress Notes (Addendum)
Patient complaining of a completely different, excrutiating pain in the inguinal area described as sharp, shooting, stabbing and is worse when coughing or changing positions. PRN pain meds given. MD notified. Orders placed, will continue to monitor.

## 2015-12-02 NOTE — Progress Notes (Signed)
[facility] Progress Note  Assessment: 1. Lower GI bleed - colon and EGD showed hemorrhoids and gastritis. Resolved.   2. Influenza B: on Tamiflu.  3. ESRD - MWF HD 4. Volume - wt down w illness, under dry wt by 2-3 kg 5. HTN - bp's soft, dc'd labetalol, cont coreg for afib 6. Anemia - Hgb stable no esa 7. Metabolic bone disease - resumed binders,  continue VDRA 8. Nutrition - Albumin 2.9 Renal diet, renal vit and prostat  9. DCM: per primary, last ECHO EF 20-25% 10. Chronic Afib: Per primary, not on anticoagulants. Cont coreg for rate control 11. Pancytopenia: thought to be related to hepatosplenomegaly and splenic sequestration 12. Hepatosplenomegaly - congestive from severe CM  Plan -  HD Monday  Kelly Splinter MD [place] pager [phone]    cell [phone] 12/02/2015, 10:27 AM    Subjective: no new complaints  Objective Filed Vitals:   12/01/15 1337 12/01/15 2147 12/02/15 0330 12/02/15 0510  BP: 92/65 119/94  133/107  Pulse: 88 111  105  Temp: 98.1 F (36.7 C) 98.5 F (36.9 C)  97.9 F (36.6 C)  TempSrc: Oral Oral  Oral  Resp: _0 Height:      Weight:   94.121 kg (207 lb 8 oz)   SpO2: 94% 97%  99%   Physical Exam General: Well nourished, NAD Heart: Irregularly, irregular. S1, S2, II/VI systolic M.  Lungs: Coarse breath sounds upper lung fields, no wheezing, no rales Abdomen:active BS. Still distended, slightly tender upper quadrants, no further bloody stools Extremities:No LE edema Dialysis Access: LUA AVF + bruit  Dialysis: [facility] MWF 4h 45mn F200 500/800 97kg 2/2 bath LUA AVF Heparin none Hectoral 6 mcg IV Q MWF  Additional Objective Labs: Basic Metabolic Panel:  Recent Labs Lab 11/30/15 0727 11/30/15 1602 12/01/15 0547  NA 136 132* 135  K 4.5 4.5 3.6  CL 97* 96* 96*  CO2 24 21* 27  GLUCOSE 76 91 84  BUN 41* 44* 21*  CREATININE 8.77* 9.12* 5.85*  CALCIUM 8.8* 8.5* 8.4*  PHOS  --  7.4*   --    Liver Function Tests:  Recent Labs Lab 11/28/15 1507 11/30/15 1602  AST 58*  --   ALT 17  --   ALKPHOS 141*  --   BILITOT 1.8*  --   PROT 6.9  --   ALBUMIN 2.9* 2.9*   No results for input(s): LIPASE, AMYLASE in the last 168 hours. CBC:  Recent Labs Lab 11/28/15 1507 11/29/15 0639 11/30/15 0727 11/30/15 1603 12/01/15 0547  WBC 1.9* 2.0* 3.5* 2.9* 2.9*  NEUTROABS 0.9*  --   --   --   --   HGB 11.2* 11.2* 11.4* 11.0* 10.8*  HCT 33.0* 33.9* 33.2* 32.7* 32.3*  MCV 83.1 83.9 83.0 81.8 82.2  PLT 82* 72* 55* 55* 53*   Blood Culture    Component Value Date/Time   SDES PERITONEAL FLUID 09/02/2014 1344   SPECREQUEST NONE 09/02/2014 1344   CULT  09/02/2014 1344    NO GROWTH 3 DAYS [facility] FINAL 09/02/2014 1344    Cardiac Enzymes: No results for input(s): CKTOTAL, CKMB, CKMBINDEX, TROPONINI in the last 168 hours. CBG:  Recent Labs Lab 11/30/15 1620 11/30/15 2250 12/01/15 12/01/15 0408 12/01/15 0758  GLUCAP 84 87 104* 85 84   Iron Studies: No results for input(s): IRON, TIBC, TRANSFERRIN, FERRITIN in the last 72 hours. _1 @ Studies/Results:  Dg Abd Portable 1v  12/02/2015  CLINICAL DATA:  Right groin pain that started last night, patient states he also has the pain when he coughs. EXAM: PORTABLE ABDOMEN - 1 VIEW COMPARISON:  CT 11/28/2015 FINDINGS: No dilated loops of large or small bowel. Gas and stool in the rectum. No pathologic calcifications. No organomegaly. No acute osseous abnormality IMPRESSION: No evidence of bowel obstruction. Electronically Signed   By: Suzy Bouchard M.D.   On: 12/02/2015 07:10   Medications: . sodium chloride Stopped (12/01/15 2218)   . B-complex with vitamin C  1 tablet Oral Daily  . carvedilol  3.125 mg Oral BID WC  . feeding supplement (PRO-STAT SUGAR FREE 64)  30 mL Oral BID  . oseltamivir  30 mg Oral Q M,W,F-HD  . pantoprazole  40 mg Oral BID AC  . peg 3350  powder  0.5 kit Oral Once  . sevelamer carbonate  2,400 mg Oral TID WC

## 2015-12-02 NOTE — Progress Notes (Signed)
[name]                                            Patient Demographics:    Male Trevor [name], is a 65 y.o. male, DOB - 04/26/1951, MRN:[id number]  Admit date - 11/28/2015   Admitting Physician Preetha Joseph, MD  Outpatient Primary MD for the patient is SANDERS,ROBYN N, MD  LOS - 4  Chief Complaint  Patient presents with  . Abdominal Pain, rectal bleeding        Subjective:  Between 4-5 am started having excruciating pain in R groin   Assessment  & Plan :   Lower GI bleed/chronic abdominal pain -Ct abd pelvis without evidence of ischemic colitis, bleeding subsided, Hb stable -Aspirin on hold -[facility] consulting  -colonoscopy in 2007 with diverticulosis -EGD and Colonoscopy with gastritis and internal hemorrhoids -continue PPI  Severe R groin pain this am 3/26 -in region of R inguinal canal, has been coughing a lot due to Flu which could have precipitated this -called and d/w Dr.W[place], since i do not feel a hernia at this time will get CT Abd and call surgery back if this is concerning for a hernia/complication  Influenza B -continue Tamiflu even though symptoms started >48hours prior to admission -improving   ESRD on hemodialysis (HCC) -Will notify renal of admission    DCM- EF 25-30% by echo 09/04/14 -Volume managed with dialysis, continue Coreg -stopped labetalol   Abdominal pain, chronic, epigastric -As above -last admission, had an Abdominal ultrasound with nodular hepatic contour and splenomegaly with findings consistent with chronic liver disease. CT abdomen and pelvis on 10/14/2015 with chronic granulomatous disease with adenopathy and splenomegaly and volume overload. Patient had a prior liver biopsy December 2015 which showed sinusoidal dilatation and scattered foci hepatitis and negative for cirrhosis then   Chronic  atrial fibrillation (HCC) -CHADS2VASC is 3  -continue coreg, rate controlled. 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 Discontinued secondary to spontaneous back hematoma. Patient has refused anticoagulation since  DVT Prophylaxis- SCDs  Full [place] Family Communication: no family at bedside, plan d/w pt DC to Home when improved  Consults  : GI, Renal  DVT Prophylaxis  :  SCDs  Lab Results  Component Value Date   PLT 53* 12/01/2015    Anti-infectives    Start     Dose/Rate Route Frequency Ordered Stop   11/30/15 1200  oseltamivir (TAMIFLU) capsule 30 mg     30 mg Oral Every M-W-F (Hemodialysis) 11/29/15 1354 12/05/15 1159   11/28/15 1830  oseltamivir (TAMIFLU) capsule 30 mg  Status:  Discontinued     30 mg Oral Daily 11/28/15 1743 11/29/15 1354        Objective:   Filed Vitals:   12/01/15 1337 12/01/15 2147 12/02/15 0330 12/02/15 0510  BP: 92/65 119/94  133/107  Pulse: 88 111  105  Temp: 98.1 F (36.7 C) 98.5 F (36.9 C)  97.9 F (36.6 C)  TempSrc: Oral Oral  Oral  Resp: 20 14  20  Height:        Weight:   94.121 kg (207 lb 8 oz)   SpO2: 94% 97%  99%    Wt Readings from Last 3 Encounters:  12/02/15 94.121 kg (207 lb 8 oz)  11/17/15 95.346 kg (210 lb 3.2 oz)  10/30/15 97.07 kg (214 lb)     Intake/Output Summary (Last 24 hours) at 12/02/15 0833 Last data filed at 12/01/15 2200  Gross per 24 hour  Intake    420 ml  Output      0 ml  Net    420 ml     Physical Exam  Awake Alert, Oriented X 3, uncomfortable appearing HEENT: [place].AT,PERRAL, Supple Neck,No JVD, No cervical lymphadenopathy appriciated.   Lungs: Symmetrical Chest wall movement, Good air movement bilaterally, CTAB CVA: Systolic murmur, RRR,No Gallops,Rubs  Abd: soft, distended, Severe tenderness in R groin/inguinal canal region, BS present but decrased, hepatosplenomegaly EXt; 1plus edema    Data Review:    CBC  Recent Labs Lab 11/28/15 1507 11/29/15 0639 11/30/15 0727 11/30/15 1603 12/01/15 0547  WBC 1.9* 2.0* 3.5* 2.9* 2.9*  HGB 11.2* 11.2* 11.4* 11.0* 10.8*  HCT 33.0* 33.9* 33.2* 32.7* 32.3*  PLT 82* 72* 55* 55* 53*  MCV 83.1 83.9 83.0 81.8 82.2  MCH 28.2 27.7 28.5 27.5 27.5  MCHC 33.9 33.0 34.3 33.6 33.4  RDW 16.0* 16.0* 16.2* 15.8* 15.7*  LYMPHSABS 0.5*  --   --   --   --   MONOABS 0.5  --   --   --   --   EOSABS 0.0  --   --   --   --   BASOSABS 0.0  --   --   --   --     Chemistries   Recent Labs Lab 11/28/15 1507 11/29/15 0639 11/30/15 0727 11/30/15 1602 12/01/15 0547  NA 133* 134* 136 132* 135  K 4.5 4.3 4.5 4.5 3.6  CL 97* 95* 97* 96* 96*  CO2 26 25 24 21* 27  GLUCOSE 75 52* 76 91 84  BUN 17 25* 41* 44* 21*  CREATININE 4.99* 6.15* 8.77* 9.12* 5.85*  CALCIUM 8.3* 8.7* 8.8* 8.5* 8.4*  AST 58*  --   --   --   --   ALT 17  --   --   --   --   ALKPHOS 141*  --   --   --   --   BILITOT 1.8*  --   --   --   --    ------------------------------------------------------------------------------------------------------------------ No results for input(s): CHOL, HDL, LDLCALC, TRIG, CHOLHDL, LDLDIRECT in the last 72 hours.  Lab Results  Component Value Date   HGBA1C 5.4 11/14/2015   ------------------------------------------------------------------------------------------------------------------ No results for input(s): TSH, T4TOTAL, T3FREE, THYROIDAB in the last 72 hours.  Invalid input(s): FREET3 ------------------------------------------------------------------------------------------------------------------ No results for input(s): VITAMINB12, FOLATE, FERRITIN, TIBC, IRON, RETICCTPCT  in the last 72 hours.  Coagulation profile No results for input(s): INR, PROTIME in the last 168 hours.  No results for input(s): DDIMER in the last 72 hours.  Cardiac Enzymes No results for input(s): CKMB, TROPONINI, MYOGLOBIN in the last 168 hours.  Invalid input(s): CK ------------------------------------------------------------------------------------------------------------------    Component Value Date/Time   BNP 1959.9* 08/30/2014 0402    Inpatient Medications  Scheduled Meds: . B-complex with vitamin C  1 tablet Oral Daily  . carvedilol  3.125 mg Oral BID WC  . feeding supplement (PRO-STAT SUGAR FREE 64)  30 mL Oral BID  . oseltamivir  30 mg Oral Q M,W,F-HD  .   pantoprazole  40 mg Oral BID AC  . peg 3350 powder  0.5 kit Oral Once  . sevelamer carbonate  2,400 mg Oral TID WC   Continuous Infusions: . sodium chloride Stopped (12/01/15 2218)   PRN Meds:.acetaminophen **OR** acetaminophen, iohexol, iohexol, morphine injection, ondansetron **OR** ondansetron (ZOFRAN) IV, oxyCODONE-acetaminophen  Micro Results No results found for this or any previous visit (from the past 240 hour(s)).  Radiology Reports Dg Chest 2 View  11/28/2015  CLINICAL DATA:  Cough, fever, congestion, and sore throat for 3 days. EXAM: CHEST  2 VIEW COMPARISON:  Chest CTA 11/14/2015 and radiographs 11/13/2015 FINDINGS: The cardiac silhouette remains moderately enlarged. A tortuous, calcified thoracic aorta is again seen. Pulmonary vascular congestion and perihilar and bibasilar opacities are similar to the prior radiographs. No pleural effusion or pneumothorax is identified. No acute osseous abnormality is identified. IMPRESSION: Cardiomegaly with pulmonary vascular congestion and mild edema, unchanged from prior radiographs. Electronically Signed   By: Allen  Grady M.D.   On: 11/28/2015 16:03   Dg Chest 2 View  11/13/2015  CLINICAL DATA:  Chest pain and shortness of breath today, cough for 1 month,  hypertension, CHF, gout, GERD, prior pulmonary embolism, coronary artery disease post MI, atrial fibrillation/flutter, end-stage renal disease on dialysis EXAM: CHEST  2 VIEW COMPARISON:  10/31/2015 FINDINGS: Enlargement of cardiac silhouette with pulmonary vascular congestion. Calcified tortuous aorta. Calcified RIGHT paratracheal lymph nodes again seen. Interstitial infiltrates in the perihilar regions likely reflecting mild chronic CHF. No segmental consolidation, pleural effusion or pneumothorax. Osseous mineralization normal without acute bony abnormality. IMPRESSION: Enlargement of cardiac silhouette with pulmonary venous hypertension and mild chronic failure. No new abnormalities. Electronically Signed   By: Mark  Boles M.D.   On: 11/13/2015 17:06   Ct Angio Chest Pe W/cm &/or Wo Cm  11/14/2015  CLINICAL DATA:  Pleuritic chest pain. History of pulmonary embolus and DVT. EXAM: CT ANGIOGRAPHY CHEST WITH CONTRAST TECHNIQUE: Multidetector CT imaging of the chest was performed using the standard protocol during bolus administration of intravenous contrast. Multiplanar CT image reconstructions and MIPs were obtained to evaluate the vascular anatomy. CONTRAST:  80mL OMNIPAQUE IOHEXOL 350 MG/ML SOLN COMPARISON:  Chest radiograph 1 day prior.  Chest CT 09/05/2014 FINDINGS: Resolution of previous filling defect in right middle lobe pulmonary artery. No new filling defects or acute pulmonary embolus. Multi chamber cardiomegaly, with prominent right atrial dilatation and significant contrast refluxing into the hepatic veins and IVC. Small pericardial effusion. Thoracic aorta with scattered atherosclerosis. No aneurysm. Coronary artery calcifications are seen. Extensive calcified mediastinal and bilateral hilar adenopathy. Calcified granuloma in the right middle lobe. Heterogeneous lower lobe ground-glass opacities and bronchial thickening. A chronic triangular opacity in the posterior medial right upper lobe adjacent  to right pleural thickening/minimal effusion, most consistent with chronic atelectasis. Evaluation of the upper abdomen demonstrates hepatosplenomegaly and small volume perihepatic ascites. Atrophic kidneys. There are no acute or suspicious osseous abnormalities. Increased bone mineral density. Review of the MIP images confirms the above findings. IMPRESSION: 1. No pulmonary embolus. 2. Prominent right heart distension with contrast refluxing into the hepatic veins and IVC consistent with right heart failure. This appears chronic but advanced in degree. 3. Small pericardial effusion. 4. Multifocal calcified mediastinal adenopathy, sequela of granulomatous disease versus sarcoidosis. 5. Chronic hepatosplenomegaly. Small volume intra-abdominal ascites. Electronically Signed   By: Melanie  Ehinger M.D.   On: 11/14/2015 03:45   Us Abdomen Complete  11/14/2015  CLINICAL DATA:  Abdominal distension. EXAM: ABDOMEN ULTRASOUND COMPLETE COMPARISON:    CT 05/02/2016. FINDINGS: Gallbladder: Physiologically distended. Diffuse wall thickening measuring up to 9 mm. No gallstones visualized. No intraluminal sludge. Ring down artifact noted about the fundus. No sonographic Murphy sign noted by sonographer. Small amount of pericholecystic ascites. Common bile duct: Diameter: 3-4 mm. Liver: Hypodense lesion on CT not well seen sonographically. Within normal limits in parenchymal echogenicity. Nodular hepatic contours. Normal directional flow in the main portal vein. IVC: Prominent in diameter. Pancreas: Not well visualized. Spleen: Prominent size measuring 13.2 x 6.4 x 6.2 cm, splenic volume 274 mL. Right Kidney: Length: 8.3 cm. Atrophic with thinning of the renal parenchyma and increased echogenicity, patient with history of chronic renal disease on dialysis. No mass or hydronephrosis visualized. Left Kidney: Length: 6.6 cm. Atrophic with thinning of the renal parenchyma and increased renal echogenicity. Probable cyst arising from  the lateral kidney measuring 1.9 cm. No solid mass or hydronephrosis visualized. Abdominal aorta: No aneurysm visualized. Proximal aorta measures 3 cm, mid and distal aorta obscured. Other findings: Small volume perihepatic ascites. IMPRESSION: 1. Nodular hepatic contours, with diffuse gallbladder wall thickening and mild splenomegaly. Findings consistent with chronic liver disease. Small volume perihepatic ascites. 2. Ring down artifact in the gallbladder wall, may reflect adenomyomatosis. 3. Shrunken echogenic kidneys consistent chronic renal disease. Electronically Signed   By: Melanie  Ehinger M.D.   On: 11/14/2015 01:22   Ct Abdomen Pelvis W Contrast  11/28/2015  CLINICAL DATA:  Abdominal pain beginning after umbilical hernia surgery. History of end-stage renal disease. EXAM: CT ABDOMEN AND PELVIS WITH CONTRAST TECHNIQUE: Multidetector CT imaging of the abdomen and pelvis was performed using the standard protocol following bolus administration of intravenous contrast. CONTRAST:  100mL OMNIPAQUE IOHEXOL 300 MG/ML  SOLN COMPARISON:  10/14/2015 FINDINGS: Small bilateral pleural effusions with basilar atelectasis. Peribronchial thickening with mild peribronchial infiltration suggesting acute or chronic bronchitis. Calcified granulomas in the right lung with calcified lymph nodes in the right hilum and mediastinum. Coronary artery calcifications. Cardiac enlargement with prominent right heart enlargement and left atrial enlargement. Calcified lymph nodes at the EG junction. Hepatic enlargement. Sub cm low-attenuation lesion in segment 5 likely represents a cyst. No change since previous study. Retrograde flow of contrast material into the hepatic veins and IVC consistent with passive congestion due to right heart failure. Spleen is enlarged without focal lesion demonstrated. Moderate diffuse free fluid throughout the abdomen and pelvis likely represents ascites. Prominent lymph nodes in the celiac access and  porta hepatis are probably reactive and may be related to liver disease. Mild prominence of retroperitoneal lymph nodes without pathologic enlargement. Gallbladder is contracted with thickened wall, likely physiologic. Pancreas, adrenal glands, inferior vena cava are normal. Bilateral renal parenchymal atrophy with cysts demonstrated in the kidneys. No hydronephrosis. Delayed nephrograms. Calcification of the abdominal aorta without aneurysm. Calcification of the celiac axis and superior mesenteric artery. Stomach, small bowel, and colon are not abnormally distended. Contrast material flows through to the rectum suggesting no evidence of large or small bowel obstruction. No free air in the abdomen. Pelvis: Bladder is decompressed. Prostate gland is enlarged, measuring 4.7 cm diameter. Moderately prominent lymph nodes in the groin regions are likely reactive. Degenerative changes in the spine. No destructive bone lesions. IMPRESSION: Cardiac enlargement with passive congestion of contrast material into the hepatic veins and IVC. Hepatic splenomegaly. Reactive abdominal lymph nodes likely related to liver disease. Moderate diffuse abdominal and pelvic free fluid consistent with ascites. Atrophic kidneys with delayed nephrograms consistent with history of renal failure. Granulomatous changes in the   chest. Small pleural effusions with basilar atelectasis and bronchitic changes. Electronically Signed   By: Lucienne Capers M.D.   On: 11/28/2015 22:57   Dg Abd Portable 1v  12/02/2015  CLINICAL DATA:  Right groin pain that started last night, patient states he also has the pain when he coughs. EXAM: PORTABLE ABDOMEN - 1 VIEW COMPARISON:  CT 11/28/2015 FINDINGS: No dilated loops of large or small bowel. Gas and stool in the rectum. No pathologic calcifications. No organomegaly. No acute osseous abnormality IMPRESSION: No evidence of bowel obstruction. Electronically Signed   By: Suzy Bouchard M.D.   On: 12/02/2015  07:10    Time Spent in minutes   35mn   Shakevia Sarris M.D on 12/02/2015 at 8:33 AM  Between 7am to 7pm - Pager - [phone]  After 7pm go to [email or web address] - password [email or web address] -  Office  [phone]

## 2015-12-03 ENCOUNTER — Encounter: Payer: Self-pay | Admitting: Gastroenterology

## 2015-12-03 LAB — RENAL FUNCTION PANEL
ANION GAP: 13 (ref 5–15)
Albumin: 2.9 g/dL — ABNORMAL LOW (ref 3.5–5.0)
BUN: 39 mg/dL — ABNORMAL HIGH (ref 6–20)
CALCIUM: 8.8 mg/dL — AB (ref 8.9–10.3)
CO2: 25 mmol/L (ref 22–32)
CREATININE: 9.07 mg/dL — AB (ref 0.61–1.24)
Chloride: 96 mmol/L — ABNORMAL LOW (ref 101–111)
GFR calc non Af Amer: 5 mL/min — ABNORMAL LOW (ref >60)
GFR, EST AFRICAN AMERICAN: 6 mL/min — AB (ref >60)
Glucose, Bld: 65 mg/dL (ref 65–99)
PHOSPHORUS: 5.3 mg/dL — AB (ref 2.5–4.6)
Potassium: 4.4 mmol/L (ref 3.5–5.1)
Sodium: 134 mmol/L — ABNORMAL LOW (ref 135–145)

## 2015-12-03 LAB — CBC
HEMATOCRIT: 32.7 % — AB (ref 39.0–52.0)
HEMOGLOBIN: 11.1 g/dL — AB (ref 13.0–17.0)
MCH: 27.3 pg (ref 26.0–34.0)
MCHC: 33.9 g/dL (ref 30.0–36.0)
MCV: 80.5 fL (ref 78.0–100.0)
Platelets: 76 10*3/uL — ABNORMAL LOW (ref 150–400)
RBC: 4.06 MIL/uL — ABNORMAL LOW (ref 4.22–5.81)
RDW: 15.4 % (ref 11.5–15.5)
WBC: 2.8 10*3/uL — ABNORMAL LOW (ref 4.0–10.5)

## 2015-12-03 MED ORDER — PANTOPRAZOLE SODIUM 40 MG PO TBEC: 40.0000 mg | DELAYED_RELEASE_TABLET | Freq: Two times a day (BID) | ORAL | Status: DC

## 2015-12-03 MED ORDER — PENTAFLUOROPROP-TETRAFLUOROETH EX AERO
1.0000 | INHALATION_SPRAY | CUTANEOUS | Status: DC | PRN
Start: 2015-12-03 — End: 2015-12-03

## 2015-12-03 MED ORDER — HEPARIN SODIUM (PORCINE) 1000 UNIT/ML DIALYSIS
1000.0000 [IU] | INTRAMUSCULAR | Status: DC | PRN
Start: 2015-12-03 — End: 2015-12-03
  Filled 2015-12-03: qty 1

## 2015-12-03 MED ORDER — SODIUM CHLORIDE 0.9 % IV SOLN: 100.0000 mL | INTRAVENOUS | Status: DC | PRN

## 2015-12-03 MED ORDER — LIDOCAINE HCL (PF) 1 % IJ SOLN
5.0000 mL | INTRAMUSCULAR | Status: DC | PRN
  Filled 2015-12-03: qty 5

## 2015-12-03 MED ORDER — ALTEPLASE 2 MG IJ SOLR: 2.0000 mg | Freq: Once | INTRAMUSCULAR | Status: DC | PRN

## 2015-12-03 MED ORDER — LIDOCAINE-PRILOCAINE 2.5-2.5 % EX CREA: 1.0000 "application " | TOPICAL_CREAM | CUTANEOUS | Status: DC | PRN

## 2015-12-03 NOTE — Progress Notes (Signed)
Trevor [name] discharged Home with wife per MD order.  Discharge instructions reviewed and discussed with the patient, all questions and concerns answered. Copy of instructions, care notes for new diagnosis & medication and scripts given to patient.    Medication List    STOP taking these medications        labetalol 200 MG tablet  Commonly known as:  NORMODYNE      TAKE these medications        aspirin 81 MG EC tablet  Take 1 tablet (81 mg total) by mouth daily.     B-complex with vitamin C tablet  Take 1 tablet by mouth daily.     carvedilol 3.125 MG tablet  Commonly known as:  COREG  Take 1 tablet (3.125 mg total) by mouth 2 (two) times daily with a meal.     feeding supplement (NEPRO CARB STEADY) Liqd  Take 237 mLs by mouth 2 (two) times daily between meals.     gi cocktail Susp suspension  Take 30 mLs by mouth 3 (three) times daily as needed for indigestion. Shake well.     nitroGLYCERIN 0.4 MG SL tablet  Commonly known as:  NITROSTAT  Place 1 tablet (0.4 mg total) under the tongue every 5 (five) minutes as needed for chest pain.     oxyCODONE-acetaminophen 5-325 MG tablet  Commonly known as:  PERCOCET/ROXICET  Take 1 tablet by mouth every 6 (six) hours as needed. Pain     pantoprazole 40 MG tablet  Commonly known as:  PROTONIX  Take 1 tablet (40 mg total) by mouth 2 (two) times daily before a meal.     sevelamer carbonate 800 MG tablet  Commonly known as:  RENVELA  Take 4 tablets (3,200 mg total) by mouth 3 (three) times daily with meals.        Patients skin is clean, dry and intact, no evidence of skin break down. IV site discontinued and catheter remains intact. Site without signs and symptoms of complications. Dressing and pressure applied.  Patient escorted to car by NT in a wheelchair,  no distress noted upon discharge.  [name], Lea Walbert C 12/03/2015 3:14 PM

## 2015-12-03 NOTE — Discharge Summary (Signed)
Physician Discharge Summary  Trevor [name] YQM:[id number] DOB: 27-Jul-1951 DOA: 11/28/2015  PCP: [name], MD  Admit date: 11/28/2015 Discharge date: 12/03/2015  Time spent: 45inutes  Recommendations for Outpatient Follow-up:  1. PCP in 1 week 2. Dr.Nandigam in 1 month, if continues to have recurrence of abd pain, consider CTA abd to r/o chronic intestinal ischemia   Discharge Diagnoses:    Lower GI bleed   Gastritis   Internal hemorrhoids    ESRD on hemodialysis (HCC)   DCM- EF 25-30% by echo 09/04/14   Abdominal pain, chronic, epigastric   Chronic atrial fibrillation (HCC)   NICM (nonischemic cardiomyopathy) (HCC)   Rectal bleeding   GI bleed   Hematochezia   Chronic RUQ pain   Gastritis   Discharge Condition: stable  Diet recommendation: Renal  Filed Weights   12/02/15 0330 12/03/15 0341 12/03/15 0735  Weight: 94.121 kg (207 lb 8 oz) 94.575 kg (208 lb 8 oz) 95 kg (209 lb 7 oz)    History of present illness:  Trevor [name] is a 65 y.o. male, with PMH of end-stage renal disease on HD (MWF), biventricular heart failure, EF 25%, A. 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 He has been having issues with abdominal pain for over 2 months now, had multiple unremarkable scans, is being worked up by [facility] with a plan for endoscopy and colonoscopy in a week. 3/22 he was at dialysis and started having cramping abdominal pain which is not unusual for him subsequently went to the bathroom and had a large bloody bowel movement, it was bright red associated with crampy abdominal pain. He had completed dialysis at that point and went home on his way home he had to stop to use the bathroom and subsequently had another bloody  bowel movement, and the third one while in the emergency room. In addition he's also been having cough congestion, runny nose for 3-4 days, with sick contacts and his family, was found to have a fever of 102 in the emergency room  Hospital Course:  Lower GI bleed/chronic abdominal pain -Ct abd pelvis without evidence of ischemic colitis, bleeding subsided, Hb remained relatively stable and didnt require transfusion -Aspirin was on hold, this was resumed since no further bleeding in >48hours -[facility] consulted, colonoscopy in 2007 with diverticulosis -EGD and Colonoscopy with gastritis and internal hemorrhoids -continue PPI at discharge  Severe R groin pain on 3/26 am -in region of R inguinal canal, has been coughing a lot due to Flu which could have precipitated this -called and d/w Dr.Wakefield, since i did not feel a hernia, he recommended CT Abd pelvis, this was unremarkable, pain resolved yesterday evening  Influenza B -continue Tamiflu even though symptoms started >48hours prior to admission -improving, will complete last dose of Tamiflu per HD dosing today   ESRD on hemodialysis (HCC) -managed per Renal   DCM- EF 25-30% by echo 09/04/14 -Volume managed with dialysis, continue Coreg -stopped labetalol   Abdominal pain, chronic, epigastric -As above -last admission, had an Abdominal ultrasound with nodular hepatic contour and splenomegaly with findings consistent with chronic liver disease. CT abdomen and pelvis on 10/14/2015 with chronic granulomatous disease with adenopathy and splenomegaly and volume overload. Patient had a prior liver biopsy December 2015 which showed sinusoidal dilatation and scattered foci hepatitis and negative for cirrhosis then  Chronic atrial fibrillation (HCC) -CHADS2VASC is 3  -continue coreg, rate controlled. Patient has refused anticoagulation in the past secondary to prior history of Spontaneous back hematoma and GI bleed per previous  documentation and not appropriate at this time anyway   Pancytopenia -likely due to hepatosplenomegaly and splenic sequestration, worsened due to URI/viral illness -improving  history of DVT/PE -remote, not in our system from 2014 onwards -he was previously on Coumadin. Discontinued secondary to spontaneous back hematoma. Patient has refused anticoagulation since then.  Consultations:  Renal  D/w [facility] Dr.Wakefield  Discharge Exam: Filed Vitals:   12/03/15 0930 12/03/15 1000  BP: 134/75 111/65  Pulse: 89 86  Temp:    Resp:      General: AAOx3 Cardiovascular: S1S2/RRR Respiratory: CTAB  Discharge Instructions   Discharge Instructions    Discharge instructions    Complete by:  As directed   Renal Diet     Increase activity slowly    Complete by:  As directed           Current Discharge Medication List    CONTINUE these medications which have CHANGED   Details  pantoprazole (PROTONIX) 40 MG tablet Take 1 tablet (40 mg total) by mouth 2 (two) times daily before a meal. Qty: 60 tablet, Refills: 0      CONTINUE these medications which have NOT CHANGED   Details  aspirin EC 81 MG EC tablet Take 1 tablet (81 mg total) by mouth daily.    B Complex-C (B-COMPLEX WITH VITAMIN C) tablet Take 1 tablet by mouth daily.    Nutritional Supplements (FEEDING SUPPLEMENT, NEPRO CARB STEADY,) LIQD Take 237 mLs by mouth 2 (two) times daily between meals. Qty: 60 Can, Refills: 0    oxyCODONE-acetaminophen (PERCOCET/ROXICET) 5-325 MG tablet Take 1 tablet by mouth every 6 (six) hours as needed. Pain Refills: 0    sevelamer carbonate (RENVELA) 800 MG tablet Take 4 tablets (3,200 mg total) by mouth 3 (three) times daily with meals. Qty: 360 tablet, Refills: 3    Alum & Mag Hydroxide-Simeth (GI COCKTAIL) SUSP suspension Take 30 mLs by mouth 3 (three) times daily as needed for indigestion. Shake well. Qty: 300 mL, Refills: 0    carvedilol (COREG) 3.125 MG tablet Take 1 tablet  (3.125 mg total) by mouth 2 (two) times daily with a meal. Qty: 60 tablet, Refills: 3    nitroGLYCERIN (NITROSTAT) 0.4 MG SL tablet Place 1 tablet (0.4 mg total) under the tongue every 5 (five) minutes as needed for chest pain. Qty: 15 tablet, Refills: 0      STOP taking these medications     labetalol (NORMODYNE) 200 MG tablet        Allergies  Allergen Reactions  . Pork-Derived Products Nausea And Vomiting    Culture      The results of significant diagnostics from this hospitalization (including imaging, microbiology, ancillary and laboratory) are listed below for reference.    Significant Diagnostic Studies: Dg Chest 2 View  11/28/2015  CLINICAL DATA:  Cough, fever, congestion, and sore throat for 3 days. EXAM: CHEST  2 VIEW COMPARISON:  Chest CTA 11/14/2015 and radiographs 11/13/2015 FINDINGS: The cardiac silhouette remains moderately enlarged. A tortuous, calcified thoracic aorta is again seen. Pulmonary vascular congestion and perihilar and bibasilar opacities are similar to the prior radiographs. No pleural effusion or pneumothorax is identified. No acute osseous abnormality is identified. IMPRESSION: Cardiomegaly with pulmonary vascular congestion and mild edema, unchanged from prior radiographs. Electronically Signed   By: Freida Busman  Mosetta Putt M.D.   On: 11/28/2015 16:03   Dg Chest 2 View  11/13/2015  CLINICAL DATA:  Chest pain and shortness of breath today, cough for 1 month, hypertension, CHF, gout, GERD, prior pulmonary embolism, coronary artery disease post MI, atrial fibrillation/flutter, end-stage renal disease on dialysis EXAM: CHEST  2 VIEW COMPARISON:  10/31/2015 FINDINGS: Enlargement of cardiac silhouette with pulmonary vascular congestion. Calcified tortuous aorta. Calcified RIGHT paratracheal lymph nodes again seen. Interstitial infiltrates in the perihilar regions likely reflecting mild chronic CHF. No segmental consolidation, pleural effusion or pneumothorax. Osseous  mineralization normal without acute bony abnormality. IMPRESSION: Enlargement of cardiac silhouette with pulmonary venous hypertension and mild chronic failure. No new abnormalities. Electronically Signed   By: Ulyses Southward M.D.   On: 11/13/2015 17:06   Ct Angio Chest Pe W/cm &/or Wo Cm  11/14/2015  CLINICAL DATA:  Pleuritic chest pain. History of pulmonary embolus and DVT. EXAM: CT ANGIOGRAPHY CHEST WITH CONTRAST TECHNIQUE: Multidetector CT imaging of the chest was performed using the standard protocol during bolus administration of intravenous contrast. Multiplanar CT image reconstructions and MIPs were obtained to evaluate the vascular anatomy. CONTRAST:  80mL OMNIPAQUE IOHEXOL 350 MG/ML SOLN COMPARISON:  Chest radiograph 1 day prior.  Chest CT 09/05/2014 FINDINGS: Resolution of previous filling defect in right middle lobe pulmonary artery. No new filling defects or acute pulmonary embolus. Multi chamber cardiomegaly, with prominent right atrial dilatation and significant contrast refluxing into the hepatic veins and IVC. Small pericardial effusion. Thoracic aorta with scattered atherosclerosis. No aneurysm. Coronary artery calcifications are seen. Extensive calcified mediastinal and bilateral hilar adenopathy. Calcified granuloma in the right middle lobe. Heterogeneous lower lobe ground-glass opacities and bronchial thickening. A chronic triangular opacity in the posterior medial right upper lobe adjacent to right pleural thickening/minimal effusion, most consistent with chronic atelectasis. Evaluation of the upper abdomen demonstrates hepatosplenomegaly and small volume perihepatic ascites. Atrophic kidneys. There are no acute or suspicious osseous abnormalities. Increased bone mineral density. Review of the MIP images confirms the above findings. IMPRESSION: 1. No pulmonary embolus. 2. Prominent right heart distension with contrast refluxing into the hepatic veins and IVC consistent with right heart failure.  This appears chronic but advanced in degree. 3. Small pericardial effusion. 4. Multifocal calcified mediastinal adenopathy, sequela of granulomatous disease versus sarcoidosis. 5. Chronic hepatosplenomegaly. Small volume intra-abdominal ascites. Electronically Signed   By: Rubye Oaks M.D.   On: 11/14/2015 03:45   US Abdomen Complete  11/14/2015  CLINICAL DATA:  Abdominal distension. EXAM: ABDOMEN ULTRASOUND COMPLETE COMPARISON:  CT 05/02/2016. FINDINGS: Gallbladder: Physiologically distended. Diffuse wall thickening measuring up to 9 mm. No gallstones visualized. No intraluminal sludge. Ring down artifact noted about the fundus. No sonographic Murphy sign noted by sonographer. Small amount of pericholecystic ascites. Common bile duct: Diameter: 3-4 mm. Liver: Hypodense lesion on CT not well seen sonographically. Within normal limits in parenchymal echogenicity. Nodular hepatic contours. Normal directional flow in the main portal vein. IVC: Prominent in diameter. Pancreas: Not well visualized. Spleen: Prominent size measuring 13.2 x 6.4 x 6.2 cm, splenic volume 274 mL. Right Kidney: Length: 8.3 cm. Atrophic with thinning of the renal parenchyma and increased echogenicity, patient with history of chronic renal disease on dialysis. No mass or hydronephrosis visualized. Left Kidney: Length: 6.6 cm. Atrophic with thinning of the renal parenchyma and increased renal echogenicity. Probable cyst arising from the lateral kidney measuring 1.9 cm. No solid mass or hydronephrosis visualized. Abdominal aorta: No aneurysm visualized. Proximal aorta measures 3 cm, mid and distal  aorta obscured. Other findings: Small volume perihepatic ascites. IMPRESSION: 1. Nodular hepatic contours, with diffuse gallbladder wall thickening and mild splenomegaly. Findings consistent with chronic liver disease. Small volume perihepatic ascites. 2. Ring down artifact in the gallbladder wall, may reflect adenomyomatosis. 3. Shrunken echogenic  kidneys consistent chronic renal disease. Electronically Signed   By: Rubye Oaks M.D.   On: 11/14/2015 01:22   Ct Abdomen Pelvis W Contrast  12/02/2015  CLINICAL DATA:  Severe RIGHT groin pain this morning. Question inguinal hernia .pain which is worsened with coughing. Patient on dialysis scheduled for dialysis tomorrow. EXAM: CT ABDOMEN AND PELVIS WITH CONTRAST TECHNIQUE: Multidetector CT imaging of the abdomen and pelvis was performed using the standard protocol following bolus administration of intravenous contrast. CONTRAST:  ISOVUE-300 IOPAMIDOL (ISOVUE-300) INJECTION 61% COMPARISON:  CT 03/22/ 2017 FINDINGS: Lower chest: Patchy ground-glass opacities at the lung bases suggest mild pulmonary edema. Findings similar comparison exam. Calcified granuloma within the RIGHT middle lobe. Multiple calcified mediastinal lymph nodes. Hepatobiliary: Contrast reflux into the hepatic veins consistent with RIGHT heart dysfunction. Low-density lesion the RIGHT hepatic lobe on image 45 measuring 11 mm cannot be fully characterized but not changed from CT of 08/30/2014. Normal gallbladder Pancreas: Pancreas is normal. No ductal dilatation. No pancreatic inflammation. Spleen: Normal spleen Adrenals/urinary tract: Adrenal glands are normal. The kidneys are small. There are several simple fluid attenuation lesions in the LEFT renal cortex. No obstruction of the kidneys. Bladder normal Stomach/Bowel: Stomach, small-bowel appendix and cecum are normal. There are diverticula throughout the colon. No acute inflammation identified. Vascular/Lymphatic: Abdominal aorta is normal caliber with atherosclerotic calcification. There is no retroperitoneal or periportal lymphadenopathy. No pelvic lymphadenopathy. Enlarged gastrohepatic lymph nodes with speckled calcifications. Similar enlarged lymph nodes in the porta hepatis measure up to 3.2 cm. Reproductive: Prostate normal Other: There is free fluid within the pelvis as well  as along the RIGHT pericolic gutter extending to the LEFT upper quadrant/splenic flexure. Small amount fluid along the RIGHT additionally adjacent to the RIGHT hepatic lobe. Musculoskeletal: No aggressive osseous lesion. IMPRESSION: 1. No evidence of inguinal hernia or abdominal hernia. 2. Moderate volume of free fluid in the abdomen pelvis suggest volume overload and/or RIGHT heart failure. Cannot exclude ascites from liver dysfunction. 3. Enlarged periportal and mesenteric calcified lymph nodes suggest prior granulomatous disease or treated lymphoma. 4. Small kidneys. Electronically Signed   By: Genevive Bi M.D.   On: 12/02/2015 15:13   Ct Abdomen Pelvis W Contrast  11/28/2015  CLINICAL DATA:  Abdominal pain beginning after umbilical hernia surgery. History of end-stage renal disease. EXAM: CT ABDOMEN AND PELVIS WITH CONTRAST TECHNIQUE: Multidetector CT imaging of the abdomen and pelvis was performed using the standard protocol following bolus administration of intravenous contrast. CONTRAST:  OMNIPAQUE IOHEXOL 300 MG/ML  SOLN COMPARISON:  10/14/2015 FINDINGS: Small bilateral pleural effusions with basilar atelectasis. Peribronchial thickening with mild peribronchial infiltration suggesting acute or chronic bronchitis. Calcified granulomas in the right lung with calcified lymph nodes in the right hilum and mediastinum. Coronary artery calcifications. Cardiac enlargement with prominent right heart enlargement and left atrial enlargement. Calcified lymph nodes at the EG junction. Hepatic enlargement. Sub cm low-attenuation lesion in segment 5 likely represents a cyst. No change since previous study. Retrograde flow of contrast material into the hepatic veins and IVC consistent with passive congestion due to right heart failure. Spleen is enlarged without focal lesion demonstrated. Moderate diffuse free fluid throughout the abdomen and pelvis likely represents ascites. Prominent lymph nodes in the  celiac access and porta hepatis are probably reactive and may be related to liver disease. Mild prominence of retroperitoneal lymph nodes without pathologic enlargement. Gallbladder is contracted with thickened wall, likely physiologic. Pancreas, adrenal glands, inferior vena cava are normal. Bilateral renal parenchymal atrophy with cysts demonstrated in the kidneys. No hydronephrosis. Delayed nephrograms. Calcification of the abdominal aorta without aneurysm. Calcification of the celiac axis and superior mesenteric artery. Stomach, small bowel, and colon are not abnormally distended. Contrast material flows through to the rectum suggesting no evidence of large or small bowel obstruction. No free air in the abdomen. Pelvis: Bladder is decompressed. Prostate gland is enlarged, measuring 4.7 cm diameter. Moderately prominent lymph nodes in the groin regions are likely reactive. Degenerative changes in the spine. No destructive bone lesions. IMPRESSION: Cardiac enlargement with passive congestion of contrast material into the hepatic veins and IVC. Hepatic splenomegaly. Reactive abdominal lymph nodes likely related to liver disease. Moderate diffuse abdominal and pelvic free fluid consistent with ascites. Atrophic kidneys with delayed nephrograms consistent with history of renal failure. Granulomatous changes in the chest. Small pleural effusions with basilar atelectasis and bronchitic changes. Electronically Signed   By: Burman Nieves M.D.   On: 11/28/2015 22:57   Dg Abd Portable 1v  12/02/2015  CLINICAL DATA:  Right groin pain that started last night, patient states he also has the pain when he coughs. EXAM: PORTABLE ABDOMEN - 1 VIEW COMPARISON:  CT 11/28/2015 FINDINGS: No dilated loops of large or small bowel. Gas and stool in the rectum. No pathologic calcifications. No organomegaly. No acute osseous abnormality IMPRESSION: No evidence of bowel obstruction. Electronically Signed   By: Genevive Bi M.D.    On: 12/02/2015 07:10    Microbiology: No results found for this or any previous visit (from the past 240 hour(s)).   Labs: Basic Metabolic Panel:  Recent Labs Lab 11/29/15 0639 11/30/15 0727 11/30/15 1602 12/01/15 0547 12/03/15 0813  NA 134* 136 132* 135 134*  K 4.3 4.5 4.5 3.6 4.4  CL 95* 97* 96* 96* 96*  CO2 25 24 21* 27 25  GLUCOSE 52* 76 91 84 65  BUN 25* 41* 44* 21* 39*  CREATININE 6.15* 8.77* 9.12* 5.85* 9.07*  CALCIUM 8.7* 8.8* 8.5* 8.4* 8.8*  PHOS  --   --  7.4*  --  5.3*   Liver Function Tests:  Recent Labs Lab 11/28/15 1507 11/30/15 1602 12/03/15 0813  AST 58*  --   --   ALT 17  --   --   ALKPHOS 141*  --   --   BILITOT 1.8*  --   --   PROT 6.9  --   --   ALBUMIN 2.9* 2.9* 2.9*   No results for input(s): LIPASE, AMYLASE in the last 168 hours. No results for input(s): AMMONIA in the last 168 hours. CBC:  Recent Labs Lab 11/28/15 1507 11/29/15 0639 11/30/15 0727 11/30/15 1603 12/01/15 0547 12/03/15 0813  WBC 1.9* 2.0* 3.5* 2.9* 2.9* 2.8*  NEUTROABS 0.9*  --   --   --   --   --   HGB 11.2* 11.2* 11.4* 11.0* 10.8* 11.1*  HCT 33.0* 33.9* 33.2* 32.7* 32.3* 32.7*  MCV 83.1 83.9 83.0 81.8 82.2 80.5  PLT 82* 72* 55* 55* 53* 76*   Cardiac Enzymes: No results for input(s): CKTOTAL, CKMB, CKMBINDEX, TROPONINI in the last 168 hours. BNP: BNP (last 3 results) No results for input(s): BNP in the last 8760 hours.  ProBNP (last 3 results) No  results for input(s): PROBNP in the last 8760 hours.  CBG:  Recent Labs Lab 11/30/15 1620 11/30/15 2250 12/01/15 12/01/15 0408 12/01/15 0758  GLUCAP 84 87 104* 85 84       Signed:  Maebell Lyvers MD.  [facility] 12/03/2015, 10:25 AM

## 2015-12-03 NOTE — Procedures (Signed)
Patient was seen on dialysis and the procedure was supervised. BFR 400 Via LAVF BP is 116/75.  Patient appears to be tolerating treatment well

## 2015-12-03 NOTE — Progress Notes (Addendum)
Patient refused labs, said they can be drawn during HD later. On-call MD notified.

## 2015-12-03 NOTE — Care Management Note (Signed)
Case Management Note  Patient Details  Name: ZAREN DUSCH MRN: [id number] Date of Birth: March 07, 1951  Subjective/Objective:                 Patient admitted with GI Bleed, Hgb stable, will DC to home today   Action/Plan:  DC to home, self care.  Expected Discharge Date:                  Expected Discharge Plan:  Home/Self Care  In-House Referral:     Discharge planning Services  CM Consult  Post Acute Care Choice:    Choice offered to:     DME Arranged:    DME Agency:     HH Arranged:    HH Agency:     Status of Service:  Completed, signed off  Medicare Important Message Given:  Yes Date Medicare IM Given:    Medicare IM give by:    Date Additional Medicare IM Given:    Additional Medicare Important Message give by:     If discussed at Long Length of Stay Meetings, dates discussed:    Additional Comments:  Lawerance Sabal, RN 12/03/2015, 1:29 PM

## 2015-12-04 ENCOUNTER — Ambulatory Visit (HOSPITAL_COMMUNITY): Admission: RE | Admit: 2015-12-04 | Payer: Medicare Other | Source: Ambulatory Visit | Admitting: Gastroenterology

## 2015-12-04 ENCOUNTER — Encounter (HOSPITAL_COMMUNITY): Admission: RE | Payer: Self-pay | Source: Ambulatory Visit

## 2015-12-04 ENCOUNTER — Other Ambulatory Visit: Payer: Self-pay | Admitting: *Deleted

## 2015-12-04 LAB — HEPATITIS B SURFACE ANTIGEN: HEP B S AG: NEGATIVE

## 2015-12-04 SURGERY — COLONOSCOPY WITH PROPOFOL
Anesthesia: Monitor Anesthesia Care

## 2015-12-04 NOTE — Patient Outreach (Signed)
Referral received from hospital liaison to initiate transition of care program.  Member discharged yesterday after being admitted with rectal bleeding.  According to chart, he also has history of cardiomyopathy, hypertension, heart failure, atrial fibrillation, and end stage renal disease with dialysis Monday, Wednesday, Friday.    Call placed to member at preferred number, [phone].  No answer, HIPPA compliant voice message left.  Will await call back, will make second attempt on Thursday since he will have dialysis tomorrow.  Kemper Durie, BSN, [facility] Care Management  [facility] Care Manager [phone]

## 2015-12-04 NOTE — Consult Note (Signed)
   [facility] CM Inpatient Consult   12/04/2015  KODAH WINGARD 1950/09/18 [id number] Consult received for patient under observation. Patient was active with [facility].  Patient already discharge back to home with wife per notes.  Will have [facility] Care Management to follow up for post hospital monitoring.  For questions, please contact: Charlesetta Shanks, RN BSN CCM [facility]  [phone] business mobile phone Toll free office [phone]

## 2015-12-05 DIAGNOSIS — N2581 Secondary hyperparathyroidism of renal origin: Secondary | ICD-10-CM | POA: Diagnosis not present

## 2015-12-05 DIAGNOSIS — N186 End stage renal disease: Secondary | ICD-10-CM | POA: Diagnosis not present

## 2015-12-05 DIAGNOSIS — D631 Anemia in chronic kidney disease: Secondary | ICD-10-CM | POA: Diagnosis not present

## 2015-12-05 DIAGNOSIS — D509 Iron deficiency anemia, unspecified: Secondary | ICD-10-CM | POA: Diagnosis not present

## 2015-12-06 ENCOUNTER — Ambulatory Visit (HOSPITAL_BASED_OUTPATIENT_CLINIC_OR_DEPARTMENT_OTHER): Payer: Medicare Other | Admitting: Oncology

## 2015-12-06 ENCOUNTER — Other Ambulatory Visit: Payer: Self-pay | Admitting: *Deleted

## 2015-12-06 ENCOUNTER — Telehealth (HOSPITAL_COMMUNITY): Payer: Self-pay | Admitting: Vascular Surgery

## 2015-12-06 VITALS — BP 117/87 | HR 77 | Temp 97.9°F | Resp 18 | Ht 73.5 in | Wt 210.3 lb

## 2015-12-06 DIAGNOSIS — N186 End stage renal disease: Secondary | ICD-10-CM | POA: Diagnosis not present

## 2015-12-06 DIAGNOSIS — D631 Anemia in chronic kidney disease: Secondary | ICD-10-CM | POA: Diagnosis not present

## 2015-12-06 DIAGNOSIS — R162 Hepatomegaly with splenomegaly, not elsewhere classified: Secondary | ICD-10-CM

## 2015-12-06 DIAGNOSIS — Z992 Dependence on renal dialysis: Secondary | ICD-10-CM

## 2015-12-06 DIAGNOSIS — D72819 Decreased white blood cell count, unspecified: Secondary | ICD-10-CM

## 2015-12-06 NOTE — Progress Notes (Signed)
Please see consult note.  

## 2015-12-06 NOTE — Addendum Note (Signed)
Addended by: Reesa Chew on: 12/06/2015 02:16 PM   Modules accepted: Orders, Medications

## 2015-12-06 NOTE — Patient Outreach (Signed)
Second attempt made to contact member to initiate transition of care program without success.   Noted that member also had appointment today at 2pm with oncology.  Will make another attempt to contact member on Tuesday as his scheduled dialysis days are Monday, Wednesday, & Friday.    Kemper Durie, BSN, [facility] Care Management  [facility] Care Manager [phone]

## 2015-12-06 NOTE — Telephone Encounter (Signed)
LEFT MESSAGE TO MAKE NEW PT APPT PER Trevor [name]

## 2015-12-06 NOTE — Consult Note (Signed)
Reason for Referral: Leukocytopenia.   HPI: 65 year old gentleman currently of [place] where he lived for the last 20 years or so. He is a pleasant gentleman with history of end-stage renal disease currently hemodialysis-dependent. He also has history of congestive heart failure along other comorbid conditions. He has had recurrent hospitalizations and the last few months for abdominal pain and most recently was found to have gastritis as well as internal hemorrhoids. He was noted to have mild pancytopenia with a CBC on 12/03/2015 showed a white cell count of 2.8, hemoglobin 11 and platelet count of 76. His CT scan of the abdomen and pelvis on 12/02/2015 showed no evidence of any lymphadenopathy other than enlarged mesenteric calcified nodules suggesting granulomatous disease. He also has moderate volume of free fluid suggesting possible ascites. Ultrasound of the abdomen done on 11/14/2015 have shown nodular hepatic contour and mild splenomegaly consistent with chronic liver disease. Other laboratory testing included a slightly elevated bilirubin of 1.8 slightly elevated AST at 58. His recent coagulation parameters indicated an elevated PTT at 17.9 on 11/14/2015. He had a normal INR and PTT. Previous CBCs dating back to 2010 had showed a similar pattern. CBC in July 2011 showed a white cell count of 2.8 and a platelet count of 117. His differential have been always normal. Clinically, he does have some symptoms related to his recent hospitalization including abdominal distention, lower extremity edema fatigue. His appetite is improving but still very low. His performance status and activity level have been reasonable and able to drive and attends to activities of daily living. He denied any recent bleeding such as hemoptysis, hematemesis he did have hematochezia related to hemorrhoids. He does not report any recurrent sinopulmonary infections. Does not report any fevers or chills or night sweats.  He does  not report any headaches, blurry vision, syncope or seizures. He does not report any chest pain, palpitation, orthopnea or leg edema. Does not report any cough, wheezing or hemoptysis. Does not report any nausea, vomiting, hematochezia, melena or constipation. He does not report any frequency, urgency or hesitancy. Does not report any skeletal complaints of arthralgias myalgias. Did not report any lymphadenopathy or petechiae. Remaining review of systems unremarkable.   Past Medical History  Diagnosis Date  . Anemia   . AVF (arteriovenous fistula) (HCC)     Left  . Secondary hyperparathyroidism (HCC)   . Hypovitaminosis D   . Hypertensive urgency     H/o  . CHF (congestive heart failure) (HCC)     EF 20-25%  . Exertional shortness of breath     "related to infection in my lungs right now" (05/03/2013)  . History of gout     "before I started doing the dialysis" (05/03/2013)  . Sleep apnea   . GERD (gastroesophageal reflux disease)   . Syncope     felt secondary to residual anesthesia the day before - 2D echo unremarkable  . Pulmonary embolism (HCC)     with right DVT secondary to recent surgery  . Atrial fibrillation (HCC)     not on coumadin due to large spontaneous hematoma on back and anemia  . Myocardial infarction (HCC) 90's  . Coronary artery disease   . Dysrhythmia     afib  . Peripheral vascular disease (HCC)     dvt leg 12/14  . Hypertension   . ESRD (end stage renal disease) on dialysis (HCC)     [facility] mon/wed/fri  . Atrial flutter (HCC) 04/27/2013  . S/P repair of  ventral hernia 03/21/2014  . Hereditary and idiopathic peripheral neuropathy 08/29/2015  . Diverticulosis   . Hepatosplenomegaly 10/18/2015    Workup per Dr Kaplan/ GI in Dec 2015 > abd pain improved after paracentesis x 2 (1.2L, 1.3L). Had negative hepA/ hep B/ hep C testing. For hepatosplenomagealy pt underwent transjugular liver biopsy by IR with hepatic vein wedge pressure measurement which showed  elevated right heart and free hepatic venous pressures likely due to right heart failure.  There was no evidence of portal hypertension. Liver bx originally was reported as "End stage liver disease/ cirrhosis", then was corrected > "No evidence of cirrhosis".  The corrected liver biopsy result reads "SINUSOIDAL DILATATION WITH SCATTERED FOCI OF HEPATITIS".  Last GI visit was Aug 2016 for bloating/ abd pain, noted he had a normal gastric empty study.  -Right lobe liver lesion. Felt to be hemangioma, Biopsy pending.  -thrombocytopenia. Dates back to 08/2013.  -ESRD. MWF HD.  -biventricular heart failure, acute right sided heart failure. Daily HD to address volume overload.    . Acute on chronic systolic right heart failure (HCC) 11/14/2015  . Elevated troponin 11/14/2015  . Renal insufficiency   . Rectal bleeding 11/2015  :  Past Surgical History  Procedure Laterality Date  . Av fistula placement Left     Dr. Charlean Sanfilippo; "I've had 2 on the left' (05/03/2013)  . Av fistula placement Right ~ 2011  . Knee arthroscopy Left   . Avgg removal Right 05/04/2013    Procedure: REMOVAL OF ARTERIOVENOUS Fistula Right Arm;  Surgeon: [name], MD;  Location: [facility] OR;  Service: Vascular;  Laterality: Right;  . Insertion of dialysis catheter Right 05/04/2013    Procedure: INSERTION OF DIALYSIS CATHETER;  Surgeon: [name], MD;  Location: [facility] OR;  Service: Vascular;  Laterality: Right;  . Colonoscopy  10-29-2005    Hx: of  . Bascilic vein transposition Left 06/27/2013    Procedure: BASCILIC VEIN TRANSPOSITION;  Surgeon: [name], MD;  Location: [facility] OR;  Service: Vascular;  Laterality: Left;  . Cardioversion N/A 08/29/2013    Procedure: CARDIOVERSION;  Surgeon: [name], MD;  Location: [facility] ENDOSCOPY;  Service: Cardiovascular;  Laterality: N/A;  . Removal of a dialysis catheter  2/15  . Hernia repair  03/21/14    Umbilical hernia-Dr. Derrell Lolling  . Umbilical hernia repair N/A 03/21/2014    Procedure:  LAPAROSCOPIC UMBILICAL HERNIA REPAIR WITH MESH;  Surgeon: Axel Filler, MD;  Location: [facility] OR;  Service: General;  Laterality: N/A;  . Insertion of mesh N/A 03/21/2014    Procedure: INSERTION OF MESH;  Surgeon: Axel Filler, MD;  Location: [facility] OR;  Service: General;  Laterality: N/A;  . Venogram Left 05/26/2013    Procedure: VENOGRAM;  Surgeon: Fransisco Hertz, MD;  Location: [facility] CATH LAB;  Service: Cardiovascular;  Laterality: Left;  . Cardiac catheterization N/A 11/15/2015    Procedure: Right Heart Cath;  Surgeon: Lennette Bihari, MD;  Location: [facility] INVASIVE CV LAB;  Service: Cardiovascular;  Laterality: N/A;  . Cardiac catheterization Right 11/15/2015    Procedure: Left Heart Cath;  Surgeon: Lennette Bihari, MD;  Location: [facility] INVASIVE CV LAB;  Service: Cardiovascular;  Laterality: Right;  . Colonoscopy N/A 11/30/2015    Procedure: COLONOSCOPY;  Surgeon: Meryl Dare, MD;  Location: [facility] ENDOSCOPY;  Service: Endoscopy;  Laterality: N/A;  . Esophagogastroduodenoscopy N/A 11/30/2015    Procedure: ESOPHAGOGASTRODUODENOSCOPY (EGD);  Surgeon: Meryl Dare, MD;  Location: [facility] ENDOSCOPY;  Service: Endoscopy;  Laterality: N/A;  :   Current outpatient prescriptions:  .  Alum & Mag Hydroxide-Simeth (GI COCKTAIL) SUSP suspension, Take 30 mLs by mouth 3 (three) times daily as needed for indigestion. Shake well., Disp: 300 mL, Rfl: 0 .  aspirin EC 81 MG EC tablet, Take 1 tablet (81 mg total) by mouth daily., Disp: , Rfl:  .  B Complex-C (B-COMPLEX WITH VITAMIN C) tablet, Take 1 tablet by mouth daily., Disp: , Rfl:  .  carvedilol (COREG) 3.125 MG tablet, Take 1 tablet (3.125 mg total) by mouth 2 (two) times daily with a meal., Disp: 60 tablet, Rfl: 3 .  nitroGLYCERIN (NITROSTAT) 0.4 MG SL tablet, Place 1 tablet (0.4 mg total) under the tongue every 5 (five) minutes as needed for chest pain., Disp: 15 tablet, Rfl: 0 .  Nutritional Supplements (FEEDING SUPPLEMENT, NEPRO CARB STEADY,) LIQD, Take 237 mLs by mouth 2  (two) times daily between meals., Disp: 60 Can, Rfl: 0 .  oxyCODONE-acetaminophen (PERCOCET/ROXICET) 5-325 MG tablet, Take 1 tablet by mouth every 6 (six) hours as needed. Pain, Disp: , Rfl: 0 .  pantoprazole (PROTONIX) 40 MG tablet, Take 1 tablet (40 mg total) by mouth 2 (two) times daily before a meal., Disp: 60 tablet, Rfl: 0 .  sevelamer carbonate (RENVELA) 800 MG tablet, Take 4 tablets (3,200 mg total) by mouth 3 (three) times daily with meals. (Patient taking differently: Take 2,400 mg by mouth 3 (three) times daily with meals. ), Disp: 360 tablet, Rfl: 3:  Allergies  Allergen Reactions  . Pork-Derived Products Nausea And Vomiting    Culture  :  Family History  Problem Relation Age of Onset  . Hypertension Father   . Kidney disease Father   . Allergies Father   . Deep vein thrombosis Sister   . Pulmonary embolism Sister   . Diabetes Paternal Grandmother   :  Social History   Social History  . Marital Status: Significant Other    Spouse Name: N/A  . Number of Children: 4  . Years of Education: N/A   Occupational History  . Disabled    Social History Main Topics  . Smoking status: Former Smoker -- 0.25 packs/day for .5 years    Types: Cigarettes    Quit date: 09/08/1972  . Smokeless tobacco: Never Used  . Alcohol Use: No     Comment: 05/03/2013 "haven't had a glass of wine in ~ 3 months or so; sometimes will have one w/dinner"  . Drug Use: No  . Sexual Activity: Yes    Birth Control/ Protection: None   Other Topics Concern  . Not on file   Social History Narrative   Former smoker- quit over 30 yrs ago   Patient is on disability  :  Pertinent items are noted in HPI.  Exam: Blood pressure 117/87, pulse 77, temperature 97.9 F (36.6 C), temperature source Oral, resp. rate 18, height 6' 1.5" (1.867 m), weight 210 lb 4.8 oz (95.391 kg), SpO2 99 %.  ECOG 1 General appearance: alert and cooperative. Appeared without distress. Head: Normocephalic, without obvious  abnormality. No oral ulcers or lesions. Neck: no adenopathy Back: negative Resp: clear to auscultation bilaterally Chest wall: no tenderness Cardio: regular rate and rhythm, S1, S2 normal, no murmur, click, rub or gallop GI: soft, non-tender; bowel sounds normal; no masses,  no organomegaly. Slightly distended without rebound or guarding. Extremities: extremities normal, atraumatic. Trace edema noted at the ankles. Pulses: 2+ and symmetric Skin: Skin color, texture, turgor normal. No  rashes or lesions   CBC    Component Value Date/Time   WBC 2.8* 12/03/2015 0813   WBC 2.7* 10/25/2009 0933   RBC 4.06* 12/03/2015 0813   RBC 3.69* 04/05/2010 1519   RBC 3.89* 10/25/2009 0933   HGB 11.1* 12/03/2015 0813   HGB 9.9* 10/25/2009 0933   HCT 32.7* 12/03/2015 0813   HCT 30.4* 10/25/2009 0933   PLT 76* 12/03/2015 0813   PLT 148 10/25/2009 0933   MCV 80.5 12/03/2015 0813   MCV 78.1* 10/25/2009 0933   MCH 27.3 12/03/2015 0813   MCH 25.4* 10/25/2009 0933   MCHC 33.9 12/03/2015 0813   MCHC 32.6 10/25/2009 0933   RDW 15.4 12/03/2015 0813   RDW 14.7* 10/25/2009 0933   LYMPHSABS 0.5* 11/28/2015 1507   LYMPHSABS 0.7* 10/25/2009 0933   MONOABS 0.5 11/28/2015 1507   MONOABS 0.5 10/25/2009 0933   EOSABS 0.0 11/28/2015 1507   EOSABS 0.2 10/25/2009 0933   BASOSABS 0.0 11/28/2015 1507   BASOSABS 0.0 10/25/2009 0933      Chemistry      Component Value Date/Time   NA 134* 12/03/2015 0813   K 4.4 12/03/2015 0813   CL 96* 12/03/2015 0813   CO2 25 12/03/2015 0813   BUN 39* 12/03/2015 0813   CREATININE 9.07* 12/03/2015 0813      Component Value Date/Time   CALCIUM 8.8* 12/03/2015 0813   CALCIUM 10.3 04/05/2010 1337   ALKPHOS 141* 11/28/2015 1507   AST 58* 11/28/2015 1507   ALT 17 11/28/2015 1507   BILITOT 1.8* 11/28/2015 1507        Ct Angio Chest Pe W/cm &/or Wo Cm  11/14/2015  CLINICAL DATA:  Pleuritic chest pain. History of pulmonary embolus and DVT. EXAM: CT ANGIOGRAPHY CHEST WITH  CONTRAST TECHNIQUE: Multidetector CT imaging of the chest was performed using the standard protocol during bolus administration of intravenous contrast. Multiplanar CT image reconstructions and MIPs were obtained to evaluate the vascular anatomy. CONTRAST:  80mL OMNIPAQUE IOHEXOL 350 MG/ML SOLN COMPARISON:  Chest radiograph 1 day prior.  Chest CT 09/05/2014 FINDINGS: Resolution of previous filling defect in right middle lobe pulmonary artery. No new filling defects or acute pulmonary embolus. Multi chamber cardiomegaly, with prominent right atrial dilatation and significant contrast refluxing into the hepatic veins and IVC. Small pericardial effusion. Thoracic aorta with scattered atherosclerosis. No aneurysm. Coronary artery calcifications are seen. Extensive calcified mediastinal and bilateral hilar adenopathy. Calcified granuloma in the right middle lobe. Heterogeneous lower lobe ground-glass opacities and bronchial thickening. A chronic triangular opacity in the posterior medial right upper lobe adjacent to right pleural thickening/minimal effusion, most consistent with chronic atelectasis. Evaluation of the upper abdomen demonstrates hepatosplenomegaly and small volume perihepatic ascites. Atrophic kidneys. There are no acute or suspicious osseous abnormalities. Increased bone mineral density. Review of the MIP images confirms the above findings. IMPRESSION: 1. No pulmonary embolus. 2. Prominent right heart distension with contrast refluxing into the hepatic veins and IVC consistent with right heart failure. This appears chronic but advanced in degree. 3. Small pericardial effusion. 4. Multifocal calcified mediastinal adenopathy, sequela of granulomatous disease versus sarcoidosis. 5. Chronic hepatosplenomegaly. Small volume intra-abdominal ascites. Electronically Signed   By: Rubye Oaks M.D.   On: 11/14/2015 03:45   US Abdomen Complete  11/14/2015  CLINICAL DATA:  Abdominal distension. EXAM: ABDOMEN  ULTRASOUND COMPLETE COMPARISON:  CT 05/02/2016. FINDINGS: Gallbladder: Physiologically distended. Diffuse wall thickening measuring up to 9 mm. No gallstones visualized. No intraluminal sludge. Ring down artifact noted about  the fundus. No sonographic Murphy sign noted by sonographer. Small amount of pericholecystic ascites. Common bile duct: Diameter: 3-4 mm. Liver: Hypodense lesion on CT not well seen sonographically. Within normal limits in parenchymal echogenicity. Nodular hepatic contours. Normal directional flow in the main portal vein. IVC: Prominent in diameter. Pancreas: Not well visualized. Spleen: Prominent size measuring 13.2 x 6.4 x 6.2 cm, splenic volume 274 mL. Right Kidney: Length: 8.3 cm. Atrophic with thinning of the renal parenchyma and increased echogenicity, patient with history of chronic renal disease on dialysis. No mass or hydronephrosis visualized. Left Kidney: Length: 6.6 cm. Atrophic with thinning of the renal parenchyma and increased renal echogenicity. Probable cyst arising from the lateral kidney measuring 1.9 cm. No solid mass or hydronephrosis visualized. Abdominal aorta: No aneurysm visualized. Proximal aorta measures 3 cm, mid and distal aorta obscured. Other findings: Small volume perihepatic ascites. IMPRESSION: 1. Nodular hepatic contours, with diffuse gallbladder wall thickening and mild splenomegaly. Findings consistent with chronic liver disease. Small volume perihepatic ascites. 2. Ring down artifact in the gallbladder wall, may reflect adenomyomatosis. 3. Shrunken echogenic kidneys consistent chronic renal disease. Electronically Signed   By: Rubye Oaks M.D.   On: 11/14/2015 01:22   Ct Abdomen Pelvis W Contrast  12/02/2015  CLINICAL DATA:  Severe RIGHT groin pain this morning. Question inguinal hernia .pain which is worsened with coughing. Patient on dialysis scheduled for dialysis tomorrow. EXAM: CT ABDOMEN AND PELVIS WITH CONTRAST TECHNIQUE: Multidetector CT  imaging of the abdomen and pelvis was performed using the standard protocol following bolus administration of intravenous contrast. CONTRAST:  ISOVUE-300 IOPAMIDOL (ISOVUE-300) INJECTION 61% COMPARISON:  CT 03/22/ 2017 FINDINGS: Lower chest: Patchy ground-glass opacities at the lung bases suggest mild pulmonary edema. Findings similar comparison exam. Calcified granuloma within the RIGHT middle lobe. Multiple calcified mediastinal lymph nodes. Hepatobiliary: Contrast reflux into the hepatic veins consistent with RIGHT heart dysfunction. Low-density lesion the RIGHT hepatic lobe on image 45 measuring 11 mm cannot be fully characterized but not changed from CT of 08/30/2014. Normal gallbladder Pancreas: Pancreas is normal. No ductal dilatation. No pancreatic inflammation. Spleen: Normal spleen Adrenals/urinary tract: Adrenal glands are normal. The kidneys are small. There are several simple fluid attenuation lesions in the LEFT renal cortex. No obstruction of the kidneys. Bladder normal Stomach/Bowel: Stomach, small-bowel appendix and cecum are normal. There are diverticula throughout the colon. No acute inflammation identified. Vascular/Lymphatic: Abdominal aorta is normal caliber with atherosclerotic calcification. There is no retroperitoneal or periportal lymphadenopathy. No pelvic lymphadenopathy. Enlarged gastrohepatic lymph nodes with speckled calcifications. Similar enlarged lymph nodes in the porta hepatis measure up to 3.2 cm. Reproductive: Prostate normal Other: There is free fluid within the pelvis as well as along the RIGHT pericolic gutter extending to the LEFT upper quadrant/splenic flexure. Small amount fluid along the RIGHT additionally adjacent to the RIGHT hepatic lobe. Musculoskeletal: No aggressive osseous lesion. IMPRESSION: 1. No evidence of inguinal hernia or abdominal hernia. 2. Moderate volume of free fluid in the abdomen pelvis suggest volume overload and/or RIGHT heart failure. Cannot  exclude ascites from liver dysfunction. 3. Enlarged periportal and mesenteric calcified lymph nodes suggest prior granulomatous disease or treated lymphoma. 4. Small kidneys. Electronically Signed   By: Genevive Bi M.D.   On: 12/02/2015 15:13   Ct Abdomen Pelvis W Contrast  11/28/2015  CLINICAL DATA:  Abdominal pain beginning after umbilical hernia surgery. History of end-stage renal disease. EXAM: CT ABDOMEN AND PELVIS WITH CONTRAST TECHNIQUE: Multidetector CT imaging of the abdomen and pelvis  was performed using the standard protocol following bolus administration of intravenous contrast. CONTRAST:  OMNIPAQUE IOHEXOL 300 MG/ML  SOLN COMPARISON:  10/14/2015 FINDINGS: Small bilateral pleural effusions with basilar atelectasis. Peribronchial thickening with mild peribronchial infiltration suggesting acute or chronic bronchitis. Calcified granulomas in the right lung with calcified lymph nodes in the right hilum and mediastinum. Coronary artery calcifications. Cardiac enlargement with prominent right heart enlargement and left atrial enlargement. Calcified lymph nodes at the EG junction. Hepatic enlargement. Sub cm low-attenuation lesion in segment 5 likely represents a cyst. No change since previous study. Retrograde flow of contrast material into the hepatic veins and IVC consistent with passive congestion due to right heart failure. Spleen is enlarged without focal lesion demonstrated. Moderate diffuse free fluid throughout the abdomen and pelvis likely represents ascites. Prominent lymph nodes in the celiac access and porta hepatis are probably reactive and may be related to liver disease. Mild prominence of retroperitoneal lymph nodes without pathologic enlargement. Gallbladder is contracted with thickened wall, likely physiologic. Pancreas, adrenal glands, inferior vena cava are normal. Bilateral renal parenchymal atrophy with cysts demonstrated in the kidneys. No hydronephrosis. Delayed  nephrograms. Calcification of the abdominal aorta without aneurysm. Calcification of the celiac axis and superior mesenteric artery. Stomach, small bowel, and colon are not abnormally distended. Contrast material flows through to the rectum suggesting no evidence of large or small bowel obstruction. No free air in the abdomen. Pelvis: Bladder is decompressed. Prostate gland is enlarged, measuring 4.7 cm diameter. Moderately prominent lymph nodes in the groin regions are likely reactive. Degenerative changes in the spine. No destructive bone lesions. IMPRESSION: Cardiac enlargement with passive congestion of contrast material into the hepatic veins and IVC. Hepatic splenomegaly. Reactive abdominal lymph nodes likely related to liver disease. Moderate diffuse abdominal and pelvic free fluid consistent with ascites. Atrophic kidneys with delayed nephrograms consistent with history of renal failure. Granulomatous changes in the chest. Small pleural effusions with basilar atelectasis and bronchitic changes. Electronically Signed   By: Burman Nieves M.D.   On: 11/28/2015 22:57       Assessment and Plan:   65 year old with the following issues:  1. Thrombocytopenia and leukocytopenia: These findings are rather chronic dates back to at least 2010. These numbers have been very mild and fluctuating. The differential diagnosis was discussed with the patient and his wife today. The likely etiology include splenic sequestration because of splenomegaly, portal hypertension and liver disease. The etiology of his liver disease is unclear but could be related to chronic congestion from right-sided heart failure as evident by he is effusion and ascites.  Other findings are extremely unlikely such as lymphoproliferative disorder, myelodysplastic syndrome or myeloproliferative disorder. He had a CT scan of the chest abdomen and pelvis which failed to show any evidence to suggest malignancy. His differential showed normal  proportion of lymphocytes, neutrophils and monocytes.  Primary hematological problem is extremely unlikely and does not warrant a bone marrow investigation. He does have borderline calcified lymph nodes that do not support a diagnosis of a lymphoproliferative process. These are likely reactive will granulomatous disease.  Her management standpoint, these findings are fairly mild and does not require any intervention. His bleeding risk is very minimal from a thrombocytopenia standpoint but could be more exacerbated because of his liver disease. Check his coagulation parameters before any procedure would be prudent.  I do not see any need for any growth factor support to boost his white cell count or platelet count.  2. Mild anemia: Related  to chronic kidney disease and will be addressed during dialysis with growth factor support. His hemoglobin is close to normal range at this time.  3. Bleeding risk: He does have slightly increased bleeding risk given his liver disease and mild thrombocytopenia as well as probably platelet dysfunction related to his chronic kidney disease. Checking coagulation parameters will be prudent before any surgery.  4. Liver disease: I have recommended follow-up with gastroenterology regarding this issue.  I will be happy to see him in the future as needed but no routine hematological follow-up is needed.

## 2015-12-06 NOTE — Patient Outreach (Signed)
Call received back from member, he states that he was out at another physician visit earlier.  He state he has been doing "better" since his discharge, but continue to complain of cough (he state he was in the hospital for a "virus.")  He denies any bleeding since being home, confirms that he has finished his course of antibiotics.  He denies fever.  Member report that he has not scheduled an appointment with his PCP, stating "I've had so much going on."  Advised on the importance of a follow up appointment, states that he will call for an appointment.   He reports dialysis Monday Wednesday, and Friday, stating it is difficult to get all other appointments in during other times, but will do so.    Member has history of heart failure, but weight and fluid status is monitored closely during dialysis treatments.  He denies any other concerns at this time, provided with contact information, encouraged to contact with questions.  Will continue with weekly calls next week, initial home visit scheduled within the next 2 weeks.  Kemper Durie, BSN, [facility] Care Management  [facility] Care Manager [phone]

## 2015-12-07 DIAGNOSIS — I10 Essential (primary) hypertension: Secondary | ICD-10-CM | POA: Diagnosis not present

## 2015-12-07 DIAGNOSIS — Z992 Dependence on renal dialysis: Secondary | ICD-10-CM | POA: Diagnosis not present

## 2015-12-07 DIAGNOSIS — N186 End stage renal disease: Secondary | ICD-10-CM | POA: Diagnosis not present

## 2015-12-07 DIAGNOSIS — D631 Anemia in chronic kidney disease: Secondary | ICD-10-CM | POA: Diagnosis not present

## 2015-12-07 DIAGNOSIS — N2581 Secondary hyperparathyroidism of renal origin: Secondary | ICD-10-CM | POA: Diagnosis not present

## 2015-12-07 DIAGNOSIS — D509 Iron deficiency anemia, unspecified: Secondary | ICD-10-CM | POA: Diagnosis not present

## 2015-12-10 DIAGNOSIS — N2581 Secondary hyperparathyroidism of renal origin: Secondary | ICD-10-CM | POA: Diagnosis not present

## 2015-12-10 DIAGNOSIS — N186 End stage renal disease: Secondary | ICD-10-CM | POA: Diagnosis not present

## 2015-12-10 DIAGNOSIS — D509 Iron deficiency anemia, unspecified: Secondary | ICD-10-CM | POA: Diagnosis not present

## 2015-12-10 DIAGNOSIS — D631 Anemia in chronic kidney disease: Secondary | ICD-10-CM | POA: Diagnosis not present

## 2015-12-12 DIAGNOSIS — D509 Iron deficiency anemia, unspecified: Secondary | ICD-10-CM | POA: Diagnosis not present

## 2015-12-12 DIAGNOSIS — N186 End stage renal disease: Secondary | ICD-10-CM | POA: Diagnosis not present

## 2015-12-12 DIAGNOSIS — D631 Anemia in chronic kidney disease: Secondary | ICD-10-CM | POA: Diagnosis not present

## 2015-12-12 DIAGNOSIS — N2581 Secondary hyperparathyroidism of renal origin: Secondary | ICD-10-CM | POA: Diagnosis not present

## 2015-12-13 ENCOUNTER — Other Ambulatory Visit: Payer: Self-pay | Admitting: *Deleted

## 2015-12-13 ENCOUNTER — Ambulatory Visit: Payer: Self-pay | Admitting: *Deleted

## 2015-12-13 NOTE — Patient Outreach (Signed)
Weekly transition of care call placed to member, no answer, HIPPA compliant voice message left.  Will await call back, will continue with transition of care program next week.  Kemper Durie, BSN, [facility] Care Management  [facility] Care Manager [phone]

## 2015-12-14 ENCOUNTER — Telehealth (HOSPITAL_COMMUNITY): Payer: Self-pay | Admitting: Vascular Surgery

## 2015-12-14 DIAGNOSIS — N186 End stage renal disease: Secondary | ICD-10-CM | POA: Diagnosis not present

## 2015-12-14 DIAGNOSIS — D631 Anemia in chronic kidney disease: Secondary | ICD-10-CM | POA: Diagnosis not present

## 2015-12-14 DIAGNOSIS — N2581 Secondary hyperparathyroidism of renal origin: Secondary | ICD-10-CM | POA: Diagnosis not present

## 2015-12-14 DIAGNOSIS — D509 Iron deficiency anemia, unspecified: Secondary | ICD-10-CM | POA: Diagnosis not present

## 2015-12-14 NOTE — Telephone Encounter (Signed)
Left pt message to make New pt appt w/ [facility]

## 2015-12-17 ENCOUNTER — Telehealth (HOSPITAL_COMMUNITY): Payer: Self-pay | Admitting: Vascular Surgery

## 2015-12-17 DIAGNOSIS — D631 Anemia in chronic kidney disease: Secondary | ICD-10-CM | POA: Diagnosis not present

## 2015-12-17 DIAGNOSIS — N186 End stage renal disease: Secondary | ICD-10-CM | POA: Diagnosis not present

## 2015-12-17 DIAGNOSIS — D509 Iron deficiency anemia, unspecified: Secondary | ICD-10-CM | POA: Diagnosis not present

## 2015-12-17 DIAGNOSIS — N2581 Secondary hyperparathyroidism of renal origin: Secondary | ICD-10-CM | POA: Diagnosis not present

## 2015-12-17 NOTE — Telephone Encounter (Signed)
Left pt message to make new pt appt 

## 2015-12-19 DIAGNOSIS — D631 Anemia in chronic kidney disease: Secondary | ICD-10-CM | POA: Diagnosis not present

## 2015-12-19 DIAGNOSIS — D509 Iron deficiency anemia, unspecified: Secondary | ICD-10-CM | POA: Diagnosis not present

## 2015-12-19 DIAGNOSIS — N2581 Secondary hyperparathyroidism of renal origin: Secondary | ICD-10-CM | POA: Diagnosis not present

## 2015-12-19 DIAGNOSIS — N186 End stage renal disease: Secondary | ICD-10-CM | POA: Diagnosis not present

## 2015-12-20 ENCOUNTER — Encounter: Payer: Self-pay | Admitting: *Deleted

## 2015-12-20 ENCOUNTER — Other Ambulatory Visit: Payer: Self-pay | Admitting: *Deleted

## 2015-12-20 NOTE — Patient Outreach (Signed)
[facility]) Care Management   12/20/2015  Trevor [name] [birth year] [id number]  Trevor [name] is an 65 y.o. male  Subjective:   Member states that he is "doing quite well."  He denies bleeding although he states he continues to have intermittent abdominal pain.  He states this is chronic and he has learned how to manage it with minimal medications.  Member reports that his wife helps him with his diet, and make sure he "eat the right things."  He state he has stopped taking his protonix and that his symptoms are managed with diet.    Objective:   Review of Systems  Constitutional: Negative.   HENT: Negative.   Eyes: Negative.   Respiratory: Negative.   Cardiovascular: Negative.   Gastrointestinal: Positive for abdominal pain.       Decreased in intensity since discharge   Genitourinary: Negative.   Musculoskeletal: Negative.   Skin: Negative.   Neurological: Negative.   Endo/Heme/Allergies: Negative.   Psychiatric/Behavioral: Negative.     Physical Exam  Constitutional: He is oriented to person, place, and time. He appears well-developed and well-nourished.  Neck: Normal range of motion.  Cardiovascular: Normal rate, regular rhythm and normal heart sounds.   Respiratory: Effort normal and breath sounds normal.  GI: Soft. Bowel sounds are normal.  Musculoskeletal: Normal range of motion.  Neurological: He is alert and oriented to person, place, and time.  Skin: Skin is warm and dry.    BP 144/92 mmHg  Pulse 79  Resp 18  Ht 1.867 m (6' 1.5")  Wt 205 lb 0.4 oz (93 kg)  BMI 26.68 kg/m2  SpO2 97%   Encounter Medications:   Outpatient Encounter Prescriptions as of 12/20/2015  Medication Sig  . B Complex-C (B-COMPLEX WITH VITAMIN C) tablet Take 1 tablet by mouth daily.  Marland Kitchen labetalol (NORMODYNE) 100 MG tablet Take 100 mg by mouth 2 (two) times daily. Patient takes 1/2 tablet daily  . nitroGLYCERIN (NITROSTAT) 0.4 MG SL tablet Place 1 tablet (0.4 mg  total) under the tongue every 5 (five) minutes as needed for chest pain.  . Nutritional Supplements (FEEDING SUPPLEMENT, NEPRO CARB STEADY,) LIQD Take 237 mLs by mouth 2 (two) times daily between meals.  Marland Kitchen oxyCODONE-acetaminophen (PERCOCET/ROXICET) 5-325 MG tablet Take 1 tablet by mouth every 6 (six) hours as needed. Pain  . sevelamer carbonate (RENVELA) 800 MG tablet Take 4 tablets (3,200 mg total) by mouth 3 (three) times daily with meals. (Patient taking differently: Take 2,400 mg by mouth 3 (three) times daily with meals. )  . Alum & Mag Hydroxide-Simeth (GI COCKTAIL) SUSP suspension Take 30 mLs by mouth 3 (three) times daily as needed for indigestion. Shake well. (Patient not taking: Reported on 12/06/2015)  . pantoprazole (PROTONIX) 40 MG tablet Take 1 tablet (40 mg total) by mouth 2 (two) times daily before a meal. (Patient not taking: Reported on 12/20/2015)   No facility-administered encounter medications on file as of 12/20/2015.    Functional Status:   In your present state of health, do you have any difficulty performing the following activities: 12/20/2015 11/29/2015  Hearing? N -  Vision? N -  Difficulty concentrating or making decisions? N -  Walking or climbing stairs? Y -  Dressing or bathing? N -  Doing errands, shopping? N N  Preparing Food and eating ? N -  Using the Toilet? N -  In the past six months, have you accidently leaked urine? N -  Do you have problems  with loss of bowel control? N -  Managing your Medications? N -  Managing your Finances? Y -  Housekeeping or managing your Housekeeping? N -    Fall/Depression Screening:    PHQ 2/9 Scores 12/20/2015  PHQ - 2 Score 1   Fall Risk  12/20/2015 08/20/2011  Falls in the past year? Yes -  Number falls in past yr: 1 -  Injury with Fall? No -  Risk Factor Category  High Fall Risk -  Risk for fall due to : History of fall(s) Other (Comment)    Assessment:    Met with member and wife as scheduled time for initial  home visit.  [facility] care management again explained, consent signed.    Member has shown no evidence of further bleeding.  He does not monitor his weight and blood pressure on a daily basis, stating that he has that done at dialysis 3 days a week.  He reports only taking his blood pressure medication when his blood pressure is elevated, and states he only take a half pill at that time.  Advised on the importance of taking daily as prescribed and checking pressure daily, he states that if he takes it daily as instructed, his pressure is too low.  Encouraged to discuss with physician for possible medication change.    He still has not made his follow up appointment with his PCP, stating he has had "so many appointments since I been discharged I just need some time."  Advised on the importance of follow up appointments, he verbalizes understanding.  He immediately places a call to PCP office, but unsuccessful in attempt to schedule appointment, he will try again later this afternoon.  He does not have a follow up with his GI physician yet.  He states he does not want to see Dr. Silverio Decamp, but will call to have a follow up with the PA, Cecille Rubin.  He also has an appointment with cardiology on 4/20.  He states that he will cancel that appointment, stating it has been recommended for him to wear a Holter monitor.  He refuses.  Discussed the process of the holter monitoring, he continues to refuse and state he will cancel the appointment.   Plan:   Will continue with weekly transition of care calls next week.  [facility] CM Care Plan Problem One        Most Recent Value   Care Plan Problem One  Recent hospitalization   Role Documenting the Problem One  Care Management Coordinator   Care Plan for Problem One  Active   [facility] Long Term Goal (31-90 days)  Member will not be readmitted to hospital within the next 31 days   [facility] Long Term Goal Start Date  12/06/15   Interventions for Problem One Long Term Goal  Discussed with  member the importance of following discharge instructions, including follow up appointments, medications, diet, to decrease the risk of readmission   [facility] CM Short Term Goal #1 (0-30 days)  Member will have follow up appointment with PCP within the next 2 weeks   [facility] CM Short Term Goal #1 Start Date  12/20/15 [Goal not met, date reset]   Interventions for Short Term Goal #1  Discussed the importance of follow up visits within 1-2 weeks of discharge and its relation to decreasing risk of readmission   [facility] CM Short Term Goal #2 (0-30 days)  Member will report no rectal bleeding within the next 4 weeks   [facility] CM  Short Term Goal #2 Start Date  12/06/15   [facility] CM Short Term Goal #2 Met Date  12/20/15   Interventions for Short Term Goal #2  Discussed signs/symptoms of rectal bleeding, importance of notifying physician as soon as possible if bleeding noted   [facility] CM Short Term Goal #3 (0-30 days)  Member will report taking all medications as prescribed over the next 4 weeks   [facility] CM Short Term Goal #3 Start Date  12/06/15   Interventions for Short Tern Goal #3  Discussed with member the importance of following discharge instructions, including follow up appointments, medications, diet, to decrease the risk of readmission     Valente David, BSN, [place] [phone]

## 2015-12-21 DIAGNOSIS — D631 Anemia in chronic kidney disease: Secondary | ICD-10-CM | POA: Diagnosis not present

## 2015-12-21 DIAGNOSIS — N186 End stage renal disease: Secondary | ICD-10-CM | POA: Diagnosis not present

## 2015-12-21 DIAGNOSIS — N2581 Secondary hyperparathyroidism of renal origin: Secondary | ICD-10-CM | POA: Diagnosis not present

## 2015-12-21 DIAGNOSIS — D509 Iron deficiency anemia, unspecified: Secondary | ICD-10-CM | POA: Diagnosis not present

## 2015-12-22 ENCOUNTER — Encounter: Payer: Self-pay | Admitting: *Deleted

## 2015-12-24 DIAGNOSIS — D509 Iron deficiency anemia, unspecified: Secondary | ICD-10-CM | POA: Diagnosis not present

## 2015-12-24 DIAGNOSIS — D631 Anemia in chronic kidney disease: Secondary | ICD-10-CM | POA: Diagnosis not present

## 2015-12-24 DIAGNOSIS — N2581 Secondary hyperparathyroidism of renal origin: Secondary | ICD-10-CM | POA: Diagnosis not present

## 2015-12-24 DIAGNOSIS — N186 End stage renal disease: Secondary | ICD-10-CM | POA: Diagnosis not present

## 2015-12-26 DIAGNOSIS — N2581 Secondary hyperparathyroidism of renal origin: Secondary | ICD-10-CM | POA: Diagnosis not present

## 2015-12-26 DIAGNOSIS — N186 End stage renal disease: Secondary | ICD-10-CM | POA: Diagnosis not present

## 2015-12-26 DIAGNOSIS — D509 Iron deficiency anemia, unspecified: Secondary | ICD-10-CM | POA: Diagnosis not present

## 2015-12-26 DIAGNOSIS — D631 Anemia in chronic kidney disease: Secondary | ICD-10-CM | POA: Diagnosis not present

## 2015-12-27 ENCOUNTER — Ambulatory Visit: Payer: Medicare Other | Admitting: Cardiology

## 2015-12-28 ENCOUNTER — Ambulatory Visit: Payer: Medicare Other | Admitting: Gastroenterology

## 2015-12-28 ENCOUNTER — Other Ambulatory Visit: Payer: Self-pay | Admitting: *Deleted

## 2015-12-28 DIAGNOSIS — N2581 Secondary hyperparathyroidism of renal origin: Secondary | ICD-10-CM | POA: Diagnosis not present

## 2015-12-28 DIAGNOSIS — N186 End stage renal disease: Secondary | ICD-10-CM | POA: Diagnosis not present

## 2015-12-28 DIAGNOSIS — D509 Iron deficiency anemia, unspecified: Secondary | ICD-10-CM | POA: Diagnosis not present

## 2015-12-28 DIAGNOSIS — D631 Anemia in chronic kidney disease: Secondary | ICD-10-CM | POA: Diagnosis not present

## 2015-12-28 NOTE — Patient Outreach (Signed)
Call received back from member, stating he was currently at dialysis.  He denies any concerns at this time, stating that he is doing "fine."  Noted in chart that member canceled both his GI and cardiology follow up appointments.  He state that he has been having "so many appointments" that he needed some time to rest.  Encouraged to reschedule, verbalized understanding.  Denies any further bleeding.  Will follow up next week for final transition of care call.  Kemper Durie, BSN, [facility] Care Management  [facility] Care Manager [phone]

## 2015-12-28 NOTE — Patient Outreach (Signed)
Weekly transition of care call placed to member, no answer.  HIPPA compliant voice message left.  Will await call back,  will continue with weekly calls next week.  Jolene Guyett, BSN, [facility] Care Management  Community Care Manager [phone]  

## 2015-12-31 DIAGNOSIS — N186 End stage renal disease: Secondary | ICD-10-CM | POA: Diagnosis not present

## 2015-12-31 DIAGNOSIS — D631 Anemia in chronic kidney disease: Secondary | ICD-10-CM | POA: Diagnosis not present

## 2015-12-31 DIAGNOSIS — D509 Iron deficiency anemia, unspecified: Secondary | ICD-10-CM | POA: Diagnosis not present

## 2015-12-31 DIAGNOSIS — N2581 Secondary hyperparathyroidism of renal origin: Secondary | ICD-10-CM | POA: Diagnosis not present

## 2016-01-01 ENCOUNTER — Ambulatory Visit: Payer: Self-pay | Admitting: *Deleted

## 2016-01-02 DIAGNOSIS — N186 End stage renal disease: Secondary | ICD-10-CM | POA: Diagnosis not present

## 2016-01-02 DIAGNOSIS — D509 Iron deficiency anemia, unspecified: Secondary | ICD-10-CM | POA: Diagnosis not present

## 2016-01-02 DIAGNOSIS — D631 Anemia in chronic kidney disease: Secondary | ICD-10-CM | POA: Diagnosis not present

## 2016-01-02 DIAGNOSIS — N2581 Secondary hyperparathyroidism of renal origin: Secondary | ICD-10-CM | POA: Diagnosis not present

## 2016-01-03 ENCOUNTER — Ambulatory Visit: Payer: Self-pay | Admitting: *Deleted

## 2016-01-03 ENCOUNTER — Other Ambulatory Visit: Payer: Self-pay | Admitting: *Deleted

## 2016-01-03 NOTE — Patient Outreach (Signed)
Attempted to make contact for final transition of care call, no answer.  HIPPA compliant voice message left, will await call back.  Will follow up next week to assess need for further involvement.  Kemper Durie, BSN, [facility] [place] Care Management  [facility] Care Manager [phone]

## 2016-01-04 DIAGNOSIS — D631 Anemia in chronic kidney disease: Secondary | ICD-10-CM | POA: Diagnosis not present

## 2016-01-04 DIAGNOSIS — N186 End stage renal disease: Secondary | ICD-10-CM | POA: Diagnosis not present

## 2016-01-04 DIAGNOSIS — N2581 Secondary hyperparathyroidism of renal origin: Secondary | ICD-10-CM | POA: Diagnosis not present

## 2016-01-04 DIAGNOSIS — D509 Iron deficiency anemia, unspecified: Secondary | ICD-10-CM | POA: Diagnosis not present

## 2016-01-06 DIAGNOSIS — I10 Essential (primary) hypertension: Secondary | ICD-10-CM | POA: Diagnosis not present

## 2016-01-06 DIAGNOSIS — Z992 Dependence on renal dialysis: Secondary | ICD-10-CM | POA: Diagnosis not present

## 2016-01-06 DIAGNOSIS — N186 End stage renal disease: Secondary | ICD-10-CM | POA: Diagnosis not present

## 2016-01-07 DIAGNOSIS — N186 End stage renal disease: Secondary | ICD-10-CM | POA: Diagnosis not present

## 2016-01-07 DIAGNOSIS — D509 Iron deficiency anemia, unspecified: Secondary | ICD-10-CM | POA: Diagnosis not present

## 2016-01-07 DIAGNOSIS — D631 Anemia in chronic kidney disease: Secondary | ICD-10-CM | POA: Diagnosis not present

## 2016-01-07 DIAGNOSIS — N2581 Secondary hyperparathyroidism of renal origin: Secondary | ICD-10-CM | POA: Diagnosis not present

## 2016-01-08 ENCOUNTER — Encounter: Payer: Self-pay | Admitting: *Deleted

## 2016-01-08 ENCOUNTER — Ambulatory Visit: Payer: Self-pay | Admitting: *Deleted

## 2016-01-08 ENCOUNTER — Other Ambulatory Visit: Payer: Self-pay | Admitting: *Deleted

## 2016-01-08 NOTE — Patient Outreach (Signed)
Call placed to member to complete transition of care program, no answer.  HIPPA compliant voice message left.  This makes 3rd unsuccessful attempt to contact member since initial home visit.  Will send outreach letter.  If no response within 10 days, will close case.  Kemper Durie, BSN, [facility] Care Management  [facility] Care Manager [phone]

## 2016-01-09 DIAGNOSIS — D631 Anemia in chronic kidney disease: Secondary | ICD-10-CM | POA: Diagnosis not present

## 2016-01-09 DIAGNOSIS — N186 End stage renal disease: Secondary | ICD-10-CM | POA: Diagnosis not present

## 2016-01-09 DIAGNOSIS — N2581 Secondary hyperparathyroidism of renal origin: Secondary | ICD-10-CM | POA: Diagnosis not present

## 2016-01-09 DIAGNOSIS — D509 Iron deficiency anemia, unspecified: Secondary | ICD-10-CM | POA: Diagnosis not present

## 2016-01-10 ENCOUNTER — Other Ambulatory Visit: Payer: Self-pay | Admitting: *Deleted

## 2016-01-10 ENCOUNTER — Encounter: Payer: Self-pay | Admitting: *Deleted

## 2016-01-10 DIAGNOSIS — R188 Other ascites: Secondary | ICD-10-CM | POA: Diagnosis not present

## 2016-01-10 DIAGNOSIS — Z992 Dependence on renal dialysis: Secondary | ICD-10-CM | POA: Diagnosis not present

## 2016-01-10 DIAGNOSIS — I120 Hypertensive chronic kidney disease with stage 5 chronic kidney disease or end stage renal disease: Secondary | ICD-10-CM | POA: Diagnosis not present

## 2016-01-10 DIAGNOSIS — N186 End stage renal disease: Secondary | ICD-10-CM | POA: Diagnosis not present

## 2016-01-10 NOTE — Patient Outreach (Signed)
Received call back/voice message from member.  Call placed back to member to follow up on current status, no answer.  HIPPA compliant voice message left.  Will await call back.  Kemper Durie, BSN, [facility] Care Management  [facility] Care Manager [phone]

## 2016-01-10 NOTE — Patient Outreach (Signed)
Call received back from member, he states he is at the [facility] exercising.  He denies any concerns, states that he has continued to manager his diet, no GI complications at this time.  He continues to go to dialysis 3 days a week, no concerns.  Made aware to completion of transition of care program.  He denies any need for further involvement with this care manager or health coach, stating "I think I'm good.  I'll call you if I need anything."  Will notify care management assistant and PCP of case closure.  Kemper Durie, BSN, [facility] Care Management  [facility] Care Manager [phone]

## 2016-01-11 DIAGNOSIS — N2581 Secondary hyperparathyroidism of renal origin: Secondary | ICD-10-CM | POA: Diagnosis not present

## 2016-01-11 DIAGNOSIS — D631 Anemia in chronic kidney disease: Secondary | ICD-10-CM | POA: Diagnosis not present

## 2016-01-11 DIAGNOSIS — N186 End stage renal disease: Secondary | ICD-10-CM | POA: Diagnosis not present

## 2016-01-11 DIAGNOSIS — D509 Iron deficiency anemia, unspecified: Secondary | ICD-10-CM | POA: Diagnosis not present

## 2016-01-14 DIAGNOSIS — D631 Anemia in chronic kidney disease: Secondary | ICD-10-CM | POA: Diagnosis not present

## 2016-01-14 DIAGNOSIS — N186 End stage renal disease: Secondary | ICD-10-CM | POA: Diagnosis not present

## 2016-01-14 DIAGNOSIS — N2581 Secondary hyperparathyroidism of renal origin: Secondary | ICD-10-CM | POA: Diagnosis not present

## 2016-01-14 DIAGNOSIS — D509 Iron deficiency anemia, unspecified: Secondary | ICD-10-CM | POA: Diagnosis not present

## 2016-01-16 DIAGNOSIS — D631 Anemia in chronic kidney disease: Secondary | ICD-10-CM | POA: Diagnosis not present

## 2016-01-16 DIAGNOSIS — D509 Iron deficiency anemia, unspecified: Secondary | ICD-10-CM | POA: Diagnosis not present

## 2016-01-16 DIAGNOSIS — N2581 Secondary hyperparathyroidism of renal origin: Secondary | ICD-10-CM | POA: Diagnosis not present

## 2016-01-16 DIAGNOSIS — N186 End stage renal disease: Secondary | ICD-10-CM | POA: Diagnosis not present

## 2016-01-18 DIAGNOSIS — N186 End stage renal disease: Secondary | ICD-10-CM | POA: Diagnosis not present

## 2016-01-18 DIAGNOSIS — N2581 Secondary hyperparathyroidism of renal origin: Secondary | ICD-10-CM | POA: Diagnosis not present

## 2016-01-18 DIAGNOSIS — D631 Anemia in chronic kidney disease: Secondary | ICD-10-CM | POA: Diagnosis not present

## 2016-01-18 DIAGNOSIS — D509 Iron deficiency anemia, unspecified: Secondary | ICD-10-CM | POA: Diagnosis not present

## 2016-01-21 DIAGNOSIS — N2581 Secondary hyperparathyroidism of renal origin: Secondary | ICD-10-CM | POA: Diagnosis not present

## 2016-01-21 DIAGNOSIS — D631 Anemia in chronic kidney disease: Secondary | ICD-10-CM | POA: Diagnosis not present

## 2016-01-21 DIAGNOSIS — N186 End stage renal disease: Secondary | ICD-10-CM | POA: Diagnosis not present

## 2016-01-21 DIAGNOSIS — D509 Iron deficiency anemia, unspecified: Secondary | ICD-10-CM | POA: Diagnosis not present

## 2016-01-23 DIAGNOSIS — D631 Anemia in chronic kidney disease: Secondary | ICD-10-CM | POA: Diagnosis not present

## 2016-01-23 DIAGNOSIS — D509 Iron deficiency anemia, unspecified: Secondary | ICD-10-CM | POA: Diagnosis not present

## 2016-01-23 DIAGNOSIS — N186 End stage renal disease: Secondary | ICD-10-CM | POA: Diagnosis not present

## 2016-01-23 DIAGNOSIS — N2581 Secondary hyperparathyroidism of renal origin: Secondary | ICD-10-CM | POA: Diagnosis not present

## 2016-01-25 DIAGNOSIS — N2581 Secondary hyperparathyroidism of renal origin: Secondary | ICD-10-CM | POA: Diagnosis not present

## 2016-01-25 DIAGNOSIS — D509 Iron deficiency anemia, unspecified: Secondary | ICD-10-CM | POA: Diagnosis not present

## 2016-01-25 DIAGNOSIS — D631 Anemia in chronic kidney disease: Secondary | ICD-10-CM | POA: Diagnosis not present

## 2016-01-25 DIAGNOSIS — N186 End stage renal disease: Secondary | ICD-10-CM | POA: Diagnosis not present

## 2016-01-28 DIAGNOSIS — J309 Allergic rhinitis, unspecified: Secondary | ICD-10-CM | POA: Diagnosis not present

## 2016-01-28 DIAGNOSIS — N186 End stage renal disease: Secondary | ICD-10-CM | POA: Diagnosis not present

## 2016-01-28 DIAGNOSIS — D631 Anemia in chronic kidney disease: Secondary | ICD-10-CM | POA: Diagnosis not present

## 2016-01-28 DIAGNOSIS — R05 Cough: Secondary | ICD-10-CM | POA: Diagnosis not present

## 2016-01-28 DIAGNOSIS — N2581 Secondary hyperparathyroidism of renal origin: Secondary | ICD-10-CM | POA: Diagnosis not present

## 2016-01-28 DIAGNOSIS — I120 Hypertensive chronic kidney disease with stage 5 chronic kidney disease or end stage renal disease: Secondary | ICD-10-CM | POA: Diagnosis not present

## 2016-01-28 DIAGNOSIS — D509 Iron deficiency anemia, unspecified: Secondary | ICD-10-CM | POA: Diagnosis not present

## 2016-01-30 DIAGNOSIS — N186 End stage renal disease: Secondary | ICD-10-CM | POA: Diagnosis not present

## 2016-01-30 DIAGNOSIS — D631 Anemia in chronic kidney disease: Secondary | ICD-10-CM | POA: Diagnosis not present

## 2016-01-30 DIAGNOSIS — N2581 Secondary hyperparathyroidism of renal origin: Secondary | ICD-10-CM | POA: Diagnosis not present

## 2016-01-30 DIAGNOSIS — D509 Iron deficiency anemia, unspecified: Secondary | ICD-10-CM | POA: Diagnosis not present

## 2016-02-01 DIAGNOSIS — D631 Anemia in chronic kidney disease: Secondary | ICD-10-CM | POA: Diagnosis not present

## 2016-02-01 DIAGNOSIS — D509 Iron deficiency anemia, unspecified: Secondary | ICD-10-CM | POA: Diagnosis not present

## 2016-02-01 DIAGNOSIS — N2581 Secondary hyperparathyroidism of renal origin: Secondary | ICD-10-CM | POA: Diagnosis not present

## 2016-02-01 DIAGNOSIS — N186 End stage renal disease: Secondary | ICD-10-CM | POA: Diagnosis not present

## 2016-02-04 DIAGNOSIS — N2581 Secondary hyperparathyroidism of renal origin: Secondary | ICD-10-CM | POA: Diagnosis not present

## 2016-02-04 DIAGNOSIS — D631 Anemia in chronic kidney disease: Secondary | ICD-10-CM | POA: Diagnosis not present

## 2016-02-04 DIAGNOSIS — D509 Iron deficiency anemia, unspecified: Secondary | ICD-10-CM | POA: Diagnosis not present

## 2016-02-04 DIAGNOSIS — N186 End stage renal disease: Secondary | ICD-10-CM | POA: Diagnosis not present

## 2016-02-06 DIAGNOSIS — D509 Iron deficiency anemia, unspecified: Secondary | ICD-10-CM | POA: Diagnosis not present

## 2016-02-06 DIAGNOSIS — I10 Essential (primary) hypertension: Secondary | ICD-10-CM | POA: Diagnosis not present

## 2016-02-06 DIAGNOSIS — N2581 Secondary hyperparathyroidism of renal origin: Secondary | ICD-10-CM | POA: Diagnosis not present

## 2016-02-06 DIAGNOSIS — D631 Anemia in chronic kidney disease: Secondary | ICD-10-CM | POA: Diagnosis not present

## 2016-02-06 DIAGNOSIS — Z992 Dependence on renal dialysis: Secondary | ICD-10-CM | POA: Diagnosis not present

## 2016-02-06 DIAGNOSIS — N186 End stage renal disease: Secondary | ICD-10-CM | POA: Diagnosis not present

## 2016-02-08 DIAGNOSIS — D631 Anemia in chronic kidney disease: Secondary | ICD-10-CM | POA: Diagnosis not present

## 2016-02-08 DIAGNOSIS — N186 End stage renal disease: Secondary | ICD-10-CM | POA: Diagnosis not present

## 2016-02-08 DIAGNOSIS — D509 Iron deficiency anemia, unspecified: Secondary | ICD-10-CM | POA: Diagnosis not present

## 2016-02-08 DIAGNOSIS — N2581 Secondary hyperparathyroidism of renal origin: Secondary | ICD-10-CM | POA: Diagnosis not present

## 2016-02-11 DIAGNOSIS — N2581 Secondary hyperparathyroidism of renal origin: Secondary | ICD-10-CM | POA: Diagnosis not present

## 2016-02-11 DIAGNOSIS — D509 Iron deficiency anemia, unspecified: Secondary | ICD-10-CM | POA: Diagnosis not present

## 2016-02-11 DIAGNOSIS — N186 End stage renal disease: Secondary | ICD-10-CM | POA: Diagnosis not present

## 2016-02-11 DIAGNOSIS — D631 Anemia in chronic kidney disease: Secondary | ICD-10-CM | POA: Diagnosis not present

## 2016-02-13 DIAGNOSIS — N2581 Secondary hyperparathyroidism of renal origin: Secondary | ICD-10-CM | POA: Diagnosis not present

## 2016-02-13 DIAGNOSIS — D509 Iron deficiency anemia, unspecified: Secondary | ICD-10-CM | POA: Diagnosis not present

## 2016-02-13 DIAGNOSIS — N186 End stage renal disease: Secondary | ICD-10-CM | POA: Diagnosis not present

## 2016-02-13 DIAGNOSIS — D631 Anemia in chronic kidney disease: Secondary | ICD-10-CM | POA: Diagnosis not present

## 2016-02-15 DIAGNOSIS — D509 Iron deficiency anemia, unspecified: Secondary | ICD-10-CM | POA: Diagnosis not present

## 2016-02-15 DIAGNOSIS — D631 Anemia in chronic kidney disease: Secondary | ICD-10-CM | POA: Diagnosis not present

## 2016-02-15 DIAGNOSIS — N186 End stage renal disease: Secondary | ICD-10-CM | POA: Diagnosis not present

## 2016-02-15 DIAGNOSIS — N2581 Secondary hyperparathyroidism of renal origin: Secondary | ICD-10-CM | POA: Diagnosis not present

## 2016-02-18 DIAGNOSIS — N2581 Secondary hyperparathyroidism of renal origin: Secondary | ICD-10-CM | POA: Diagnosis not present

## 2016-02-18 DIAGNOSIS — N186 End stage renal disease: Secondary | ICD-10-CM | POA: Diagnosis not present

## 2016-02-18 DIAGNOSIS — D631 Anemia in chronic kidney disease: Secondary | ICD-10-CM | POA: Diagnosis not present

## 2016-02-18 DIAGNOSIS — D509 Iron deficiency anemia, unspecified: Secondary | ICD-10-CM | POA: Diagnosis not present

## 2016-02-20 DIAGNOSIS — N2581 Secondary hyperparathyroidism of renal origin: Secondary | ICD-10-CM | POA: Diagnosis not present

## 2016-02-20 DIAGNOSIS — N186 End stage renal disease: Secondary | ICD-10-CM | POA: Diagnosis not present

## 2016-02-20 DIAGNOSIS — D631 Anemia in chronic kidney disease: Secondary | ICD-10-CM | POA: Diagnosis not present

## 2016-02-20 DIAGNOSIS — D509 Iron deficiency anemia, unspecified: Secondary | ICD-10-CM | POA: Diagnosis not present

## 2016-02-22 DIAGNOSIS — N2581 Secondary hyperparathyroidism of renal origin: Secondary | ICD-10-CM | POA: Diagnosis not present

## 2016-02-22 DIAGNOSIS — D631 Anemia in chronic kidney disease: Secondary | ICD-10-CM | POA: Diagnosis not present

## 2016-02-22 DIAGNOSIS — D509 Iron deficiency anemia, unspecified: Secondary | ICD-10-CM | POA: Diagnosis not present

## 2016-02-22 DIAGNOSIS — N186 End stage renal disease: Secondary | ICD-10-CM | POA: Diagnosis not present

## 2016-02-25 DIAGNOSIS — D631 Anemia in chronic kidney disease: Secondary | ICD-10-CM | POA: Diagnosis not present

## 2016-02-25 DIAGNOSIS — N2581 Secondary hyperparathyroidism of renal origin: Secondary | ICD-10-CM | POA: Diagnosis not present

## 2016-02-25 DIAGNOSIS — D509 Iron deficiency anemia, unspecified: Secondary | ICD-10-CM | POA: Diagnosis not present

## 2016-02-25 DIAGNOSIS — N186 End stage renal disease: Secondary | ICD-10-CM | POA: Diagnosis not present

## 2016-02-27 DIAGNOSIS — N186 End stage renal disease: Secondary | ICD-10-CM | POA: Diagnosis not present

## 2016-02-27 DIAGNOSIS — D509 Iron deficiency anemia, unspecified: Secondary | ICD-10-CM | POA: Diagnosis not present

## 2016-02-27 DIAGNOSIS — D631 Anemia in chronic kidney disease: Secondary | ICD-10-CM | POA: Diagnosis not present

## 2016-02-27 DIAGNOSIS — N2581 Secondary hyperparathyroidism of renal origin: Secondary | ICD-10-CM | POA: Diagnosis not present

## 2016-02-29 DIAGNOSIS — N186 End stage renal disease: Secondary | ICD-10-CM | POA: Diagnosis not present

## 2016-02-29 DIAGNOSIS — D631 Anemia in chronic kidney disease: Secondary | ICD-10-CM | POA: Diagnosis not present

## 2016-02-29 DIAGNOSIS — D509 Iron deficiency anemia, unspecified: Secondary | ICD-10-CM | POA: Diagnosis not present

## 2016-02-29 DIAGNOSIS — N2581 Secondary hyperparathyroidism of renal origin: Secondary | ICD-10-CM | POA: Diagnosis not present

## 2016-03-03 DIAGNOSIS — N186 End stage renal disease: Secondary | ICD-10-CM | POA: Diagnosis not present

## 2016-03-03 DIAGNOSIS — D509 Iron deficiency anemia, unspecified: Secondary | ICD-10-CM | POA: Diagnosis not present

## 2016-03-03 DIAGNOSIS — D631 Anemia in chronic kidney disease: Secondary | ICD-10-CM | POA: Diagnosis not present

## 2016-03-03 DIAGNOSIS — N2581 Secondary hyperparathyroidism of renal origin: Secondary | ICD-10-CM | POA: Diagnosis not present

## 2016-03-05 DIAGNOSIS — D631 Anemia in chronic kidney disease: Secondary | ICD-10-CM | POA: Diagnosis not present

## 2016-03-05 DIAGNOSIS — N2581 Secondary hyperparathyroidism of renal origin: Secondary | ICD-10-CM | POA: Diagnosis not present

## 2016-03-05 DIAGNOSIS — N186 End stage renal disease: Secondary | ICD-10-CM | POA: Diagnosis not present

## 2016-03-05 DIAGNOSIS — D509 Iron deficiency anemia, unspecified: Secondary | ICD-10-CM | POA: Diagnosis not present

## 2016-03-07 DIAGNOSIS — N186 End stage renal disease: Secondary | ICD-10-CM | POA: Diagnosis not present

## 2016-03-07 DIAGNOSIS — N2581 Secondary hyperparathyroidism of renal origin: Secondary | ICD-10-CM | POA: Diagnosis not present

## 2016-03-07 DIAGNOSIS — D631 Anemia in chronic kidney disease: Secondary | ICD-10-CM | POA: Diagnosis not present

## 2016-03-07 DIAGNOSIS — I10 Essential (primary) hypertension: Secondary | ICD-10-CM | POA: Diagnosis not present

## 2016-03-07 DIAGNOSIS — Z992 Dependence on renal dialysis: Secondary | ICD-10-CM | POA: Diagnosis not present

## 2016-03-07 DIAGNOSIS — D509 Iron deficiency anemia, unspecified: Secondary | ICD-10-CM | POA: Diagnosis not present

## 2016-03-10 DIAGNOSIS — N186 End stage renal disease: Secondary | ICD-10-CM | POA: Diagnosis not present

## 2016-03-10 DIAGNOSIS — D631 Anemia in chronic kidney disease: Secondary | ICD-10-CM | POA: Diagnosis not present

## 2016-03-10 DIAGNOSIS — N2581 Secondary hyperparathyroidism of renal origin: Secondary | ICD-10-CM | POA: Diagnosis not present

## 2016-03-12 DIAGNOSIS — N186 End stage renal disease: Secondary | ICD-10-CM | POA: Diagnosis not present

## 2016-03-12 DIAGNOSIS — D631 Anemia in chronic kidney disease: Secondary | ICD-10-CM | POA: Diagnosis not present

## 2016-03-12 DIAGNOSIS — N2581 Secondary hyperparathyroidism of renal origin: Secondary | ICD-10-CM | POA: Diagnosis not present

## 2016-03-13 DIAGNOSIS — R05 Cough: Secondary | ICD-10-CM | POA: Diagnosis not present

## 2016-03-13 DIAGNOSIS — L299 Pruritus, unspecified: Secondary | ICD-10-CM | POA: Diagnosis not present

## 2016-03-13 DIAGNOSIS — I120 Hypertensive chronic kidney disease with stage 5 chronic kidney disease or end stage renal disease: Secondary | ICD-10-CM | POA: Diagnosis not present

## 2016-03-13 DIAGNOSIS — R21 Rash and other nonspecific skin eruption: Secondary | ICD-10-CM | POA: Diagnosis not present

## 2016-03-14 DIAGNOSIS — N2581 Secondary hyperparathyroidism of renal origin: Secondary | ICD-10-CM | POA: Diagnosis not present

## 2016-03-14 DIAGNOSIS — D631 Anemia in chronic kidney disease: Secondary | ICD-10-CM | POA: Diagnosis not present

## 2016-03-14 DIAGNOSIS — N186 End stage renal disease: Secondary | ICD-10-CM | POA: Diagnosis not present

## 2016-03-17 DIAGNOSIS — D631 Anemia in chronic kidney disease: Secondary | ICD-10-CM | POA: Diagnosis not present

## 2016-03-17 DIAGNOSIS — N2581 Secondary hyperparathyroidism of renal origin: Secondary | ICD-10-CM | POA: Diagnosis not present

## 2016-03-17 DIAGNOSIS — N186 End stage renal disease: Secondary | ICD-10-CM | POA: Diagnosis not present

## 2016-03-19 DIAGNOSIS — D631 Anemia in chronic kidney disease: Secondary | ICD-10-CM | POA: Diagnosis not present

## 2016-03-19 DIAGNOSIS — N2581 Secondary hyperparathyroidism of renal origin: Secondary | ICD-10-CM | POA: Diagnosis not present

## 2016-03-19 DIAGNOSIS — N186 End stage renal disease: Secondary | ICD-10-CM | POA: Diagnosis not present

## 2016-03-21 DIAGNOSIS — N2581 Secondary hyperparathyroidism of renal origin: Secondary | ICD-10-CM | POA: Diagnosis not present

## 2016-03-21 DIAGNOSIS — N186 End stage renal disease: Secondary | ICD-10-CM | POA: Diagnosis not present

## 2016-03-21 DIAGNOSIS — D631 Anemia in chronic kidney disease: Secondary | ICD-10-CM | POA: Diagnosis not present

## 2016-03-23 ENCOUNTER — Emergency Department (HOSPITAL_COMMUNITY): Payer: Medicare Other

## 2016-03-23 ENCOUNTER — Encounter (HOSPITAL_COMMUNITY): Payer: Self-pay | Admitting: Emergency Medicine

## 2016-03-23 ENCOUNTER — Inpatient Hospital Stay (HOSPITAL_COMMUNITY)
Admission: EM | Admit: 2016-03-23 | Discharge: 2016-03-26 | DRG: 640 | Disposition: A | Discharge status: Home / Self Care | Payer: Medicare Other | Attending: Internal Medicine | Admitting: Internal Medicine

## 2016-03-23 DIAGNOSIS — R748 Abnormal levels of other serum enzymes: Secondary | ICD-10-CM | POA: Diagnosis present

## 2016-03-23 DIAGNOSIS — I429 Cardiomyopathy, unspecified: Secondary | ICD-10-CM | POA: Diagnosis not present

## 2016-03-23 DIAGNOSIS — I5022 Chronic systolic (congestive) heart failure: Secondary | ICD-10-CM | POA: Diagnosis not present

## 2016-03-23 DIAGNOSIS — E162 Hypoglycemia, unspecified: Principal | ICD-10-CM | POA: Diagnosis present

## 2016-03-23 DIAGNOSIS — I10 Essential (primary) hypertension: Secondary | ICD-10-CM | POA: Diagnosis present

## 2016-03-23 DIAGNOSIS — I2510 Atherosclerotic heart disease of native coronary artery without angina pectoris: Secondary | ICD-10-CM | POA: Diagnosis present

## 2016-03-23 DIAGNOSIS — M109 Gout, unspecified: Secondary | ICD-10-CM | POA: Diagnosis present

## 2016-03-23 DIAGNOSIS — E875 Hyperkalemia: Secondary | ICD-10-CM | POA: Diagnosis present

## 2016-03-23 DIAGNOSIS — I132 Hypertensive heart and chronic kidney disease with heart failure and with stage 5 chronic kidney disease, or end stage renal disease: Secondary | ICD-10-CM | POA: Diagnosis not present

## 2016-03-23 DIAGNOSIS — I739 Peripheral vascular disease, unspecified: Secondary | ICD-10-CM | POA: Diagnosis present

## 2016-03-23 DIAGNOSIS — G609 Hereditary and idiopathic neuropathy, unspecified: Secondary | ICD-10-CM | POA: Diagnosis present

## 2016-03-23 DIAGNOSIS — I252 Old myocardial infarction: Secondary | ICD-10-CM

## 2016-03-23 DIAGNOSIS — D72819 Decreased white blood cell count, unspecified: Secondary | ICD-10-CM | POA: Diagnosis present

## 2016-03-23 DIAGNOSIS — I4892 Unspecified atrial flutter: Secondary | ICD-10-CM | POA: Diagnosis present

## 2016-03-23 DIAGNOSIS — N2581 Secondary hyperparathyroidism of renal origin: Secondary | ICD-10-CM | POA: Diagnosis not present

## 2016-03-23 DIAGNOSIS — N186 End stage renal disease: Secondary | ICD-10-CM | POA: Diagnosis not present

## 2016-03-23 DIAGNOSIS — R162 Hepatomegaly with splenomegaly, not elsewhere classified: Secondary | ICD-10-CM | POA: Diagnosis not present

## 2016-03-23 DIAGNOSIS — R451 Restlessness and agitation: Secondary | ICD-10-CM | POA: Diagnosis not present

## 2016-03-23 DIAGNOSIS — I4891 Unspecified atrial fibrillation: Secondary | ICD-10-CM | POA: Diagnosis not present

## 2016-03-23 DIAGNOSIS — D696 Thrombocytopenia, unspecified: Secondary | ICD-10-CM | POA: Diagnosis not present

## 2016-03-23 DIAGNOSIS — Z841 Family history of disorders of kidney and ureter: Secondary | ICD-10-CM

## 2016-03-23 DIAGNOSIS — I428 Other cardiomyopathies: Secondary | ICD-10-CM

## 2016-03-23 DIAGNOSIS — R531 Weakness: Secondary | ICD-10-CM | POA: Diagnosis not present

## 2016-03-23 DIAGNOSIS — Z87891 Personal history of nicotine dependence: Secondary | ICD-10-CM

## 2016-03-23 DIAGNOSIS — Z91018 Allergy to other foods: Secondary | ICD-10-CM

## 2016-03-23 DIAGNOSIS — Z992 Dependence on renal dialysis: Secondary | ICD-10-CM

## 2016-03-23 DIAGNOSIS — K219 Gastro-esophageal reflux disease without esophagitis: Secondary | ICD-10-CM | POA: Diagnosis present

## 2016-03-23 DIAGNOSIS — I420 Dilated cardiomyopathy: Secondary | ICD-10-CM | POA: Diagnosis present

## 2016-03-23 DIAGNOSIS — Z888 Allergy status to other drugs, medicaments and biological substances status: Secondary | ICD-10-CM

## 2016-03-23 DIAGNOSIS — E161 Other hypoglycemia: Secondary | ICD-10-CM | POA: Diagnosis not present

## 2016-03-23 DIAGNOSIS — Z79899 Other long term (current) drug therapy: Secondary | ICD-10-CM

## 2016-03-23 DIAGNOSIS — Z8249 Family history of ischemic heart disease and other diseases of the circulatory system: Secondary | ICD-10-CM

## 2016-03-23 DIAGNOSIS — Z86711 Personal history of pulmonary embolism: Secondary | ICD-10-CM

## 2016-03-23 DIAGNOSIS — R4182 Altered mental status, unspecified: Secondary | ICD-10-CM | POA: Diagnosis not present

## 2016-03-23 LAB — CBG MONITORING, ED: GLUCOSE-CAPILLARY: 69 mg/dL (ref 65–99)

## 2016-03-23 MED ORDER — MIDAZOLAM HCL 2 MG/2ML IJ SOLN
1.0000 mg | Freq: Once | INTRAMUSCULAR | Status: AC | PRN
  Administered 2016-03-24: 1 mg via INTRAVENOUS
  Filled 2016-03-23: qty 2

## 2016-03-23 MED ORDER — SODIUM CHLORIDE 0.9 % IV BOLUS (SEPSIS): 1000.0000 mL | Freq: Once | INTRAVENOUS | Status: DC

## 2016-03-23 MED ORDER — MIDAZOLAM HCL 5 MG/5ML IJ SOLN: 1.0000 mg | Freq: Once | INTRAMUSCULAR | Status: DC

## 2016-03-23 MED ORDER — DEXTROSE 10 % IV SOLN
INTRAVENOUS | Status: DC
  Administered 2016-03-23: 100 mL/h via INTRAVENOUS

## 2016-03-23 NOTE — ED Notes (Signed)
Pt brought to ED from home for increase weakness, no eating well with a CBG of 33 on EMS arrival, no hx of DM, a D50 amp given blood sugar up to 113, HD MWF, last HD on Friday. EMS VS HR 110-120, Afib on the monitor, 92% on RA, BP 130/98.

## 2016-03-23 NOTE — ED Provider Notes (Signed)
CSN: [id number]     Arrival date & time 03/23/16  2319 History  By signing my name below, I, Marisue Humble, attest that this documentation has been prepared under the direction and in the presence of Tomasita Crumble, MD . Electronically Signed: Marisue Humble, Scribe. 03/23/2016. 11:32 PM.    Chief Complaint  Patient presents with  . Hypoglycemia   The history is provided by the patient and the EMS personnel. The history is limited by the condition of the patient. No language interpreter was used.    Level 5 Caveat Due to Pt Condition  HPI Comments:  Trevor [name] is a 65 y.o. male with PMHx of CHF, gout, GERD, PE, A-fib, MI, CAD, PVD,HTN, diverticulosis, and renal insufficiency who presents to the Emergency Department via EMS complaining of lethargy and fatigue. Per EMS pts blood sugar was 33 on arrival; after administering medication it increased to 113, then decreased to 69 upon ED arrival. Per nursing note, family called EMS because pt has not been eating well and has been weak.  Past Medical History  Diagnosis Date  . Anemia   . AVF (arteriovenous fistula) (HCC)     Left  . Secondary hyperparathyroidism (HCC)   . Hypovitaminosis D   . Hypertensive urgency     H/o  . CHF (congestive heart failure) (HCC)     EF 20-25%  . Exertional shortness of breath     "related to infection in my lungs right now" (05/03/2013)  . History of gout     "before I started doing the dialysis" (05/03/2013)  . Sleep apnea   . GERD (gastroesophageal reflux disease)   . Syncope     felt secondary to residual anesthesia the day before - 2D echo unremarkable  . Pulmonary embolism (HCC)     with right DVT secondary to recent surgery  . Atrial fibrillation (HCC)     not on coumadin due to large spontaneous hematoma on back and anemia  . Myocardial infarction (HCC) 90's  . Coronary artery disease   . Dysrhythmia     afib  . Peripheral vascular disease (HCC)     dvt leg 12/14  . Hypertension   . ESRD  (end stage renal disease) on dialysis (HCC)     [facility] mon/wed/fri  . Atrial flutter (HCC) 04/27/2013  . S/P repair of ventral hernia 03/21/2014  . Hereditary and idiopathic peripheral neuropathy 08/29/2015  . Diverticulosis   . Hepatosplenomegaly 10/18/2015    Workup per Dr Kaplan/ GI in Dec 2015 > abd pain improved after paracentesis x 2 (1.2L, 1.3L). Had negative hepA/ hep B/ hep C testing. For hepatosplenomagealy pt underwent transjugular liver biopsy by IR with hepatic vein wedge pressure measurement which showed elevated right heart and free hepatic venous pressures likely due to right heart failure.  There was no evidence of portal hypertension. Liver bx originally was reported as "End stage liver disease/ cirrhosis", then was corrected > "No evidence of cirrhosis".  The corrected liver biopsy result reads "SINUSOIDAL DILATATION WITH SCATTERED FOCI OF HEPATITIS".  Last GI visit was Aug 2016 for bloating/ abd pain, noted he had a normal gastric empty study.  -Right lobe liver lesion. Felt to be hemangioma, Biopsy pending.  -thrombocytopenia. Dates back to 08/2013.  -ESRD. MWF HD.  -biventricular heart failure, acute right sided heart failure. Daily HD to address volume overload.    . Acute on chronic systolic right heart failure (HCC) 11/14/2015  . Elevated troponin 11/14/2015  . Renal  insufficiency   . Rectal bleeding 11/2015   Past Surgical History  Procedure Laterality Date  . Av fistula placement Left     Dr. Charlean Sanfilippo; "I've had 2 on the left' (05/03/2013)  . Av fistula placement Right ~ 2011  . Knee arthroscopy Left   . Avgg removal Right 05/04/2013    Procedure: REMOVAL OF ARTERIOVENOUS Fistula Right Arm;  Surgeon: [name], MD;  Location: [facility] OR;  Service: Vascular;  Laterality: Right;  . Insertion of dialysis catheter Right 05/04/2013    Procedure: INSERTION OF DIALYSIS CATHETER;  Surgeon: [name], MD;  Location: [facility] OR;  Service: Vascular;  Laterality: Right;  . Colonoscopy   10-29-2005    Hx: of  . Bascilic vein transposition Left 06/27/2013    Procedure: BASCILIC VEIN TRANSPOSITION;  Surgeon: [name], MD;  Location: [facility] OR;  Service: Vascular;  Laterality: Left;  . Cardioversion N/A 08/29/2013    Procedure: CARDIOVERSION;  Surgeon: [name], MD;  Location: [facility] ENDOSCOPY;  Service: Cardiovascular;  Laterality: N/A;  . Removal of a dialysis catheter  2/15  . Hernia repair  03/21/14    Umbilical hernia-Dr. Derrell Lolling  . Umbilical hernia repair N/A 03/21/2014    Procedure: LAPAROSCOPIC UMBILICAL HERNIA REPAIR WITH MESH;  Surgeon: Axel Filler, MD;  Location: [facility] OR;  Service: General;  Laterality: N/A;  . Insertion of mesh N/A 03/21/2014    Procedure: INSERTION OF MESH;  Surgeon: Axel Filler, MD;  Location: [facility] OR;  Service: General;  Laterality: N/A;  . Venogram Left 05/26/2013    Procedure: VENOGRAM;  Surgeon: Fransisco Hertz, MD;  Location: [facility] CATH LAB;  Service: Cardiovascular;  Laterality: Left;  . Cardiac catheterization N/A 11/15/2015    Procedure: Right Heart Cath;  Surgeon: Lennette Bihari, MD;  Location: [facility] INVASIVE CV LAB;  Service: Cardiovascular;  Laterality: N/A;  . Cardiac catheterization Right 11/15/2015    Procedure: Left Heart Cath;  Surgeon: Lennette Bihari, MD;  Location: [facility] INVASIVE CV LAB;  Service: Cardiovascular;  Laterality: Right;  . Colonoscopy N/A 11/30/2015    Procedure: COLONOSCOPY;  Surgeon: Meryl Dare, MD;  Location: [facility] ENDOSCOPY;  Service: Endoscopy;  Laterality: N/A;  . Esophagogastroduodenoscopy N/A 11/30/2015    Procedure: ESOPHAGOGASTRODUODENOSCOPY (EGD);  Surgeon: Meryl Dare, MD;  Location: [facility] ENDOSCOPY;  Service: Endoscopy;  Laterality: N/A;   Family History  Problem Relation Age of Onset  . Hypertension Father   . Kidney disease Father   . Allergies Father   . Deep vein thrombosis Sister   . Pulmonary embolism Sister   . Diabetes Paternal Grandmother    Social History  Substance Use Topics  . Smoking  status: Former Smoker -- 0.25 packs/day for .5 years    Types: Cigarettes    Quit date: 09/08/1972  . Smokeless tobacco: Never Used  . Alcohol Use: No     Comment: 05/03/2013 "haven't had a glass of wine in ~ 3 months or so; sometimes will have one w/dinner"    Review of Systems  Unable to perform ROS: Acuity of condition    Allergies  Pork-derived products  Home Medications   Prior to Admission medications   Medication Sig Start Date End Date Taking? Authorizing Provider  Alum & Mag Hydroxide-Simeth (GI COCKTAIL) SUSP suspension Take 30 mLs by mouth 3 (three) times daily as needed for indigestion. Shake well. Patient not taking: Reported on 12/06/2015 11/17/15   Rodolph Bong, MD  B Complex-C (B-COMPLEX WITH VITAMIN C)  tablet Take 1 tablet by mouth daily.    Historical Provider, MD  labetalol (NORMODYNE) 100 MG tablet Take 100 mg by mouth 2 (two) times daily. Patient takes 1/2 tablet daily    Historical Provider, MD  nitroGLYCERIN (NITROSTAT) 0.4 MG SL tablet Place 1 tablet (0.4 mg total) under the tongue every 5 (five) minutes as needed for chest pain. 11/17/15   Rodolph Bong, MD  Nutritional Supplements (FEEDING SUPPLEMENT, NEPRO CARB STEADY,) LIQD Take 237 mLs by mouth 2 (two) times daily between meals. 10/19/15   Nishant Dhungel, MD  oxyCODONE-acetaminophen (PERCOCET/ROXICET) 5-325 MG tablet Take 1 tablet by mouth every 6 (six) hours as needed. Pain 10/29/15   Historical Provider, MD  pantoprazole (PROTONIX) 40 MG tablet Take 1 tablet (40 mg total) by mouth 2 (two) times daily before a meal. Patient not taking: Reported on 12/20/2015 12/03/15   Zannie Cove, MD  sevelamer carbonate (RENVELA) 800 MG tablet Take 4 tablets (3,200 mg total) by mouth 3 (three) times daily with meals. Patient taking differently: Take 2,400 mg by mouth 3 (three) times daily with meals.  09/12/14   Tora Kindred York, PA-C   Ht  (1.803 m)  Wt 205 lb (92.987 kg)  BMI 28.60 kg/m2  SpO2 92%    Physical Exam  Constitutional: Vital signs are normal. He appears well-developed and well-nourished.  Non-toxic appearance. He does not appear ill. No distress.  HENT:  Head: Normocephalic and atraumatic.  Nose: Nose normal.  Mouth/Throat: Oropharynx is clear and moist. No oropharyngeal exudate.  Eyes: Conjunctivae and EOM are normal. Pupils are equal, round, and reactive to light. No scleral icterus.  Neck: Normal range of motion. Neck supple. No tracheal deviation, no edema, no erythema and normal range of motion present. No thyroid mass and no thyromegaly present.  Cardiovascular: Normal rate, regular rhythm, S1 normal, S2 normal, normal heart sounds, intact distal pulses and normal pulses.  Exam reveals no gallop and no friction rub.   No murmur heard. Pulmonary/Chest: Effort normal and breath sounds normal. No respiratory distress. He has no wheezes. He has no rhonchi. He has no rales.  Abdominal: Soft. Normal appearance and bowel sounds are normal. He exhibits no distension, no ascites and no mass. There is no hepatosplenomegaly. There is no tenderness. There is no rebound, no guarding and no CVA tenderness.  Musculoskeletal: Normal range of motion. He exhibits no edema or tenderness.  Lymphadenopathy:    He has no cervical adenopathy.  Neurological: He is alert. He has normal strength.  Arousable to physical stimuli; uncooperative with examination  Skin: Skin is warm, dry and intact. No petechiae and no rash noted. He is not diaphoretic. No erythema. No pallor.  Nursing note and vitals reviewed.   ED Course  Procedures   DIAGNOSTIC STUDIES: Oxygen Saturation is 92% on RA, adequate by my interpretation.    COORDINATION OF CARE: 11:32 PM Will order head CT, chest x-ray, lactic acid, CMP, CBC and UA. Discussed treatment plan with pt at bedside and pt agreed to plan.   Labs Review Labs Reviewed  COMPREHENSIVE METABOLIC PANEL - Abnormal; Notable for the following:    Sodium  133 (*)    Potassium 5.8 (*)    Chloride 98 (*)    CO2 20 (*)    BUN 66 (*)    Creatinine, Ser 7.82 (*)    Albumin 3.0 (*)    AST 61 (*)    Alkaline Phosphatase 175 (*)    Total Bilirubin 3.6 (*)  GFR calc non Af Amer 6 (*)    GFR calc Af Amer 8 (*)    All other components within normal limits  I-STAT CG4 LACTIC ACID, ED - Abnormal; Notable for the following:    Lactic Acid, Venous 4.14 (*)    All other components within normal limits  I-STAT CHEM 8, ED - Abnormal; Notable for the following:    Sodium 134 (*)    Potassium 5.8 (*)    BUN 58 (*)    Creatinine, Ser 8.10 (*)    Calcium, Ion 1.03 (*)    All other components within normal limits  CULTURE, BLOOD (ROUTINE X 2)  CULTURE, BLOOD (ROUTINE X 2)  URINE CULTURE  CBC WITH DIFFERENTIAL/PLATELET  URINALYSIS, ROUTINE W REFLEX MICROSCOPIC (NOT AT [facility])  CBG MONITORING, ED  I-STAT CG4 LACTIC ACID, ED    Imaging Review Ct Head Wo Contrast  03/24/2016  CLINICAL DATA:  Increase weakness. EXAM: CT HEAD WITHOUT CONTRAST TECHNIQUE: Contiguous axial images were obtained from the base of the skull through the vertex without intravenous contrast. COMPARISON:  April 27, 2013 FINDINGS: Mucous retention cysts or polyps are seen in the maxillary sinuses. Paranasal sinuses and mastoid air cells are otherwise within normal limits. The bones and extracranial soft tissues are within normal limits. No subdural, epidural, or subarachnoid hemorrhage. The cerebellum, brainstem, and basal cisterns are within normal limits. Ventricles and sulci are unremarkable. No acute cortical ischemia or infarct. No mass, mass effect, or midline shift. IMPRESSION: No acute intracranial process identified. Electronically Signed   By: Gerome Sam III M.D   On: 03/24/2016 01:27   Dg Chest Port 1 View  03/24/2016  CLINICAL DATA:  Acute mental status change EXAM: PORTABLE CHEST 1 VIEW COMPARISON:  November 28, 2015 FINDINGS: Stable cardiomegaly. The hila and mediastinum  are unchanged. No pulmonary nodules, masses, or focal infiltrates. IMPRESSION: Stable cardiomegaly.  No acute abnormality. Electronically Signed   By: Gerome Sam III M.D   On: 03/24/2016 01:22   I have personally reviewed and evaluated these images and lab results as part of my medical decision-making.   EKG Interpretation None      MDM   Final diagnoses:  None   Patient presents to the ED for hypoglycemia and AMS.  He is resistant to my examination.  Will obtain broad work up for AMS.  He was placed on D10 drip as his sugar continues to fall after D50.  He will likely require admission to the hospital.  1:33 AM Patient too agitated to complete work up.  He was given versed for CT scan.  He is now requiring ativan to obtain lab work and keep on the monitor.  Will order physical restraints if needed.   3:09 AM Lactate is elevated.  Patient covered with broad spectrum abx.  He continues to have BS in the 80s on D10 drip.  I spoke with Dr. Toniann Fail who will admit to stepdown.  He was given calcium, insulin, D50 for hyperkalemia.  He is due for dialysis in the morning.   I personally performed the services described in this documentation, which was scribed in my presence. The recorded information has been reviewed and is accurate.     Tomasita Crumble, MD 03/24/16 [id number]

## 2016-03-24 ENCOUNTER — Emergency Department (HOSPITAL_COMMUNITY): Payer: Medicare Other

## 2016-03-24 ENCOUNTER — Encounter (HOSPITAL_COMMUNITY): Payer: Self-pay | Admitting: Radiology

## 2016-03-24 DIAGNOSIS — E875 Hyperkalemia: Secondary | ICD-10-CM | POA: Diagnosis present

## 2016-03-24 DIAGNOSIS — I5022 Chronic systolic (congestive) heart failure: Secondary | ICD-10-CM | POA: Diagnosis present

## 2016-03-24 DIAGNOSIS — R748 Abnormal levels of other serum enzymes: Secondary | ICD-10-CM | POA: Diagnosis present

## 2016-03-24 DIAGNOSIS — Z87891 Personal history of nicotine dependence: Secondary | ICD-10-CM | POA: Diagnosis not present

## 2016-03-24 DIAGNOSIS — I2510 Atherosclerotic heart disease of native coronary artery without angina pectoris: Secondary | ICD-10-CM | POA: Diagnosis present

## 2016-03-24 DIAGNOSIS — Z992 Dependence on renal dialysis: Secondary | ICD-10-CM | POA: Diagnosis not present

## 2016-03-24 DIAGNOSIS — K219 Gastro-esophageal reflux disease without esophagitis: Secondary | ICD-10-CM | POA: Diagnosis present

## 2016-03-24 DIAGNOSIS — G609 Hereditary and idiopathic neuropathy, unspecified: Secondary | ICD-10-CM | POA: Diagnosis present

## 2016-03-24 DIAGNOSIS — Z79899 Other long term (current) drug therapy: Secondary | ICD-10-CM | POA: Diagnosis not present

## 2016-03-24 DIAGNOSIS — R531 Weakness: Secondary | ICD-10-CM | POA: Diagnosis not present

## 2016-03-24 DIAGNOSIS — I420 Dilated cardiomyopathy: Secondary | ICD-10-CM | POA: Diagnosis not present

## 2016-03-24 DIAGNOSIS — D696 Thrombocytopenia, unspecified: Secondary | ICD-10-CM | POA: Diagnosis present

## 2016-03-24 DIAGNOSIS — I4891 Unspecified atrial fibrillation: Secondary | ICD-10-CM | POA: Diagnosis present

## 2016-03-24 DIAGNOSIS — E162 Hypoglycemia, unspecified: Secondary | ICD-10-CM | POA: Diagnosis present

## 2016-03-24 DIAGNOSIS — I132 Hypertensive heart and chronic kidney disease with heart failure and with stage 5 chronic kidney disease, or end stage renal disease: Secondary | ICD-10-CM | POA: Diagnosis present

## 2016-03-24 DIAGNOSIS — N2581 Secondary hyperparathyroidism of renal origin: Secondary | ICD-10-CM | POA: Diagnosis present

## 2016-03-24 DIAGNOSIS — I739 Peripheral vascular disease, unspecified: Secondary | ICD-10-CM | POA: Diagnosis present

## 2016-03-24 DIAGNOSIS — I10 Essential (primary) hypertension: Secondary | ICD-10-CM | POA: Diagnosis not present

## 2016-03-24 DIAGNOSIS — R162 Hepatomegaly with splenomegaly, not elsewhere classified: Secondary | ICD-10-CM | POA: Diagnosis present

## 2016-03-24 DIAGNOSIS — D72819 Decreased white blood cell count, unspecified: Secondary | ICD-10-CM | POA: Diagnosis present

## 2016-03-24 DIAGNOSIS — Z888 Allergy status to other drugs, medicaments and biological substances status: Secondary | ICD-10-CM | POA: Diagnosis not present

## 2016-03-24 DIAGNOSIS — N186 End stage renal disease: Secondary | ICD-10-CM | POA: Diagnosis present

## 2016-03-24 DIAGNOSIS — M109 Gout, unspecified: Secondary | ICD-10-CM | POA: Diagnosis present

## 2016-03-24 DIAGNOSIS — I252 Old myocardial infarction: Secondary | ICD-10-CM | POA: Diagnosis not present

## 2016-03-24 DIAGNOSIS — R451 Restlessness and agitation: Secondary | ICD-10-CM | POA: Diagnosis not present

## 2016-03-24 DIAGNOSIS — I4892 Unspecified atrial flutter: Secondary | ICD-10-CM | POA: Diagnosis present

## 2016-03-24 DIAGNOSIS — I429 Cardiomyopathy, unspecified: Secondary | ICD-10-CM | POA: Diagnosis present

## 2016-03-24 LAB — I-STAT CG4 LACTIC ACID, ED: Lactic Acid, Venous: 4.14 mmol/L (ref 0.5–1.9)

## 2016-03-24 LAB — CBC WITH DIFFERENTIAL/PLATELET
BASOS ABS: 0 10*3/uL (ref 0.0–0.1)
BASOS PCT: 0 %
Basophils Absolute: 0 10*3/uL (ref 0.0–0.1)
Basophils Relative: 0 %
EOS ABS: 0 10*3/uL (ref 0.0–0.7)
EOS ABS: 0 10*3/uL (ref 0.0–0.7)
EOS PCT: 0 %
EOS PCT: 0 %
HCT: 41.2 % (ref 39.0–52.0)
HEMATOCRIT: 41.5 % (ref 39.0–52.0)
HEMOGLOBIN: 14.2 g/dL (ref 13.0–17.0)
Hemoglobin: 14.3 g/dL (ref 13.0–17.0)
LYMPHS ABS: 1.1 10*3/uL (ref 0.7–4.0)
LYMPHS PCT: 17 %
Lymphocytes Relative: 20 %
Lymphs Abs: 1 10*3/uL (ref 0.7–4.0)
MCH: 27.8 pg (ref 26.0–34.0)
MCH: 27.8 pg (ref 26.0–34.0)
MCHC: 34.5 g/dL (ref 30.0–36.0)
MCHC: 34.5 g/dL (ref 30.0–36.0)
MCV: 80.8 fL (ref 78.0–100.0)
MCV: 80.6 fL (ref 78.0–100.0)
MONO ABS: 0.5 10*3/uL (ref 0.1–1.0)
MONOS PCT: 8 %
MONOS PCT: 8 %
Monocytes Absolute: 0.5 10*3/uL (ref 0.1–1.0)
NEUTROS ABS: 3.8 10*3/uL (ref 1.7–7.7)
NEUTROS PCT: 75 %
Neutro Abs: 4.7 10*3/uL (ref 1.7–7.7)
Neutrophils Relative %: 72 %
PLATELETS: 118 10*3/uL — AB (ref 150–400)
Platelets: 120 10*3/uL — ABNORMAL LOW (ref 150–400)
RBC: 5.1 MIL/uL (ref 4.22–5.81)
RBC: 5.15 MIL/uL (ref 4.22–5.81)
RDW: 19.1 % — ABNORMAL HIGH (ref 11.5–15.5)
RDW: 19.4 % — AB (ref 11.5–15.5)
WBC: 6.3 10*3/uL (ref 4.0–10.5)
WBC: 5.3 10*3/uL (ref 4.0–10.5)

## 2016-03-24 LAB — COMPREHENSIVE METABOLIC PANEL
ALBUMIN: 2.9 g/dL — AB (ref 3.5–5.0)
ALK PHOS: 168 U/L — AB (ref 38–126)
ALT: 28 U/L (ref 17–63)
AST: 68 U/L — AB (ref 15–41)
Anion gap: 16 — ABNORMAL HIGH (ref 5–15)
BILIRUBIN TOTAL: 3.5 mg/dL — AB (ref 0.3–1.2)
BUN: 70 mg/dL — AB (ref 6–20)
CALCIUM: 9.8 mg/dL (ref 8.9–10.3)
CO2: 20 mmol/L — AB (ref 22–32)
CREATININE: 7.86 mg/dL — AB (ref 0.61–1.24)
Chloride: 96 mmol/L — ABNORMAL LOW (ref 101–111)
GFR calc Af Amer: 7 mL/min — ABNORMAL LOW (ref >60)
GFR calc non Af Amer: 6 mL/min — ABNORMAL LOW (ref >60)
GLUCOSE: 127 mg/dL — AB (ref 65–99)
Potassium: 5.7 mmol/L — ABNORMAL HIGH (ref 3.5–5.1)
SODIUM: 132 mmol/L — AB (ref 135–145)
TOTAL PROTEIN: 7.7 g/dL (ref 6.5–8.1)

## 2016-03-24 LAB — TROPONIN I
TROPONIN I: 0.06 ng/mL — AB (ref <0.03)
TROPONIN I: 0.06 ng/mL — AB (ref <0.03)
Troponin I: 0.06 ng/mL (ref <0.03)

## 2016-03-24 LAB — AMMONIA: Ammonia: 69 umol/L — ABNORMAL HIGH (ref 9–35)

## 2016-03-24 LAB — CBG MONITORING, ED
GLUCOSE-CAPILLARY: 45 mg/dL — AB (ref 65–99)
GLUCOSE-CAPILLARY: 124 mg/dL — AB (ref 65–99)
GLUCOSE-CAPILLARY: 84 mg/dL (ref 65–99)
Glucose-Capillary: 100 mg/dL — ABNORMAL HIGH (ref 65–99)
Glucose-Capillary: 106 mg/dL — ABNORMAL HIGH (ref 65–99)
Glucose-Capillary: 113 mg/dL — ABNORMAL HIGH (ref 65–99)
Glucose-Capillary: 105 mg/dL — ABNORMAL HIGH (ref 65–99)

## 2016-03-24 LAB — COMPREHENSIVE METABOLIC PANEL WITH GFR
ALT: 28 U/L (ref 17–63)
AST: 61 U/L — ABNORMAL HIGH (ref 15–41)
Albumin: 3 g/dL — ABNORMAL LOW (ref 3.5–5.0)
Alkaline Phosphatase: 175 U/L — ABNORMAL HIGH (ref 38–126)
Anion gap: 15 (ref 5–15)
BUN: 66 mg/dL — ABNORMAL HIGH (ref 6–20)
CO2: 20 mmol/L — ABNORMAL LOW (ref 22–32)
Calcium: 9.6 mg/dL (ref 8.9–10.3)
Chloride: 98 mmol/L — ABNORMAL LOW (ref 101–111)
Creatinine, Ser: 7.82 mg/dL — ABNORMAL HIGH (ref 0.61–1.24)
GFR calc Af Amer: 8 mL/min — ABNORMAL LOW
GFR calc non Af Amer: 6 mL/min — ABNORMAL LOW
Glucose, Bld: 86 mg/dL (ref 65–99)
Potassium: 5.8 mmol/L — ABNORMAL HIGH (ref 3.5–5.1)
Sodium: 133 mmol/L — ABNORMAL LOW (ref 135–145)
Total Bilirubin: 3.6 mg/dL — ABNORMAL HIGH (ref 0.3–1.2)
Total Protein: 7.9 g/dL (ref 6.5–8.1)

## 2016-03-24 LAB — CBC
HEMATOCRIT: 40.7 % (ref 39.0–52.0)
HEMOGLOBIN: 14 g/dL (ref 13.0–17.0)
MCH: 27.5 pg (ref 26.0–34.0)
MCHC: 34.4 g/dL (ref 30.0–36.0)
MCV: 80 fL (ref 78.0–100.0)
Platelets: 99 10*3/uL — ABNORMAL LOW (ref 150–400)
RBC: 5.09 MIL/uL (ref 4.22–5.81)
RDW: 19 % — ABNORMAL HIGH (ref 11.5–15.5)
WBC: 3.6 10*3/uL — ABNORMAL LOW (ref 4.0–10.5)

## 2016-03-24 LAB — I-STAT CHEM 8, ED
BUN: 58 mg/dL — AB (ref 6–20)
CALCIUM ION: 1.03 mmol/L — AB (ref 1.12–1.23)
CHLORIDE: 101 mmol/L (ref 101–111)
CREATININE: 8.1 mg/dL — AB (ref 0.61–1.24)
GLUCOSE: 84 mg/dL (ref 65–99)
HCT: 48 % (ref 39.0–52.0)
Hemoglobin: 16.3 g/dL (ref 13.0–17.0)
Potassium: 5.8 mmol/L — ABNORMAL HIGH (ref 3.5–5.1)
SODIUM: 134 mmol/L — AB (ref 135–145)
TCO2: 22 mmol/L (ref 0–100)

## 2016-03-24 LAB — RENAL FUNCTION PANEL
ANION GAP: 13 (ref 5–15)
Albumin: 2.7 g/dL — ABNORMAL LOW (ref 3.5–5.0)
BUN: 31 mg/dL — ABNORMAL HIGH (ref 6–20)
CO2: 25 mmol/L (ref 22–32)
Calcium: 9.5 mg/dL (ref 8.9–10.3)
Chloride: 96 mmol/L — ABNORMAL LOW (ref 101–111)
Creatinine, Ser: 5.05 mg/dL — ABNORMAL HIGH (ref 0.61–1.24)
GFR calc non Af Amer: 11 mL/min — ABNORMAL LOW (ref >60)
GFR, EST AFRICAN AMERICAN: 13 mL/min — AB (ref >60)
GLUCOSE: 108 mg/dL — AB (ref 65–99)
POTASSIUM: 3.8 mmol/L (ref 3.5–5.1)
Phosphorus: 3.8 mg/dL (ref 2.5–4.6)
Sodium: 134 mmol/L — ABNORMAL LOW (ref 135–145)

## 2016-03-24 LAB — GLUCOSE, CAPILLARY
GLUCOSE-CAPILLARY: 130 mg/dL — AB (ref 65–99)
Glucose-Capillary: 132 mg/dL — ABNORMAL HIGH (ref 65–99)

## 2016-03-24 LAB — LACTIC ACID, PLASMA
Lactic Acid, Venous: 3.6 mmol/L (ref 0.5–1.9)
Lactic Acid, Venous: 3.5 mmol/L (ref 0.5–1.9)

## 2016-03-24 MED ORDER — LORAZEPAM 2 MG/ML IJ SOLN
2.0000 mg | Freq: Once | INTRAMUSCULAR | Status: AC
  Administered 2016-03-24: 2 mg via INTRAVENOUS
  Filled 2016-03-24: qty 1

## 2016-03-24 MED ORDER — DEXTROSE 50 % IV SOLN
50.0000 mL | Freq: Once | INTRAVENOUS | Status: AC
Start: 2016-03-24 — End: 2016-03-24
  Administered 2016-03-24: 50 mL via INTRAVENOUS

## 2016-03-24 MED ORDER — ONDANSETRON HCL 4 MG/2ML IJ SOLN: 4.0000 mg | Freq: Four times a day (QID) | INTRAMUSCULAR | Status: DC | PRN

## 2016-03-24 MED ORDER — SODIUM CHLORIDE 0.9 % IV SOLN
2.0000 g | Freq: Once | INTRAVENOUS | Status: AC
  Administered 2016-03-24: 2 g via INTRAVENOUS
  Filled 2016-03-24: qty 20

## 2016-03-24 MED ORDER — NEPRO/CARBSTEADY PO LIQD
237.0000 mL | Freq: Two times a day (BID) | ORAL | Status: DC
  Administered 2016-03-25 – 2016-03-26 (×3): 237 mL via ORAL
  Filled 2016-03-24 (×7): qty 237

## 2016-03-24 MED ORDER — ACETAMINOPHEN 325 MG PO TABS: 650.0000 mg | ORAL_TABLET | Freq: Four times a day (QID) | ORAL | Status: DC | PRN

## 2016-03-24 MED ORDER — LABETALOL HCL 100 MG PO TABS
50.0000 mg | ORAL_TABLET | Freq: Two times a day (BID) | ORAL | Status: DC
  Administered 2016-03-24 – 2016-03-26 (×4): 50 mg via ORAL
  Filled 2016-03-24 (×4): qty 1

## 2016-03-24 MED ORDER — RENA-VITE PO TABS
1.0000 | ORAL_TABLET | Freq: Every day | ORAL | Status: DC
  Administered 2016-03-24 – 2016-03-25 (×2): 1 via ORAL
  Filled 2016-03-24 (×2): qty 1

## 2016-03-24 MED ORDER — HYDROCOD POLST-CPM POLST ER 10-8 MG/5ML PO SUER
5.0000 mL | Freq: Two times a day (BID) | ORAL | Status: DC | PRN
  Administered 2016-03-25 – 2016-03-26 (×4): 5 mL via ORAL
  Filled 2016-03-24 (×5): qty 5

## 2016-03-24 MED ORDER — NITROGLYCERIN 0.4 MG SL SUBL: 0.4000 mg | SUBLINGUAL_TABLET | SUBLINGUAL | Status: DC | PRN

## 2016-03-24 MED ORDER — ACETAMINOPHEN 650 MG RE SUPP: 650.0000 mg | Freq: Four times a day (QID) | RECTAL | Status: DC | PRN

## 2016-03-24 MED ORDER — DOXERCALCIFEROL 4 MCG/2ML IV SOLN
2.0000 ug | INTRAVENOUS | Status: DC
  Administered 2016-03-24: 2 ug via INTRAVENOUS

## 2016-03-24 MED ORDER — SODIUM CHLORIDE 0.9 % IV SOLN: 100.0000 mL | INTRAVENOUS | Status: DC | PRN

## 2016-03-24 MED ORDER — B COMPLEX-C PO TABS: 1.0000 | ORAL_TABLET | Freq: Every day | ORAL | Status: DC

## 2016-03-24 MED ORDER — ONDANSETRON HCL 4 MG PO TABS: 4.0000 mg | ORAL_TABLET | Freq: Four times a day (QID) | ORAL | Status: DC | PRN

## 2016-03-24 MED ORDER — HYDROXYZINE HCL 25 MG PO TABS
ORAL_TABLET | ORAL | Status: AC
  Filled 2016-03-24: qty 1

## 2016-03-24 MED ORDER — DEXTROSE 10 % IV SOLN
INTRAVENOUS | Status: DC
  Administered 2016-03-24: 09:00:00 via INTRAVENOUS

## 2016-03-24 MED ORDER — LABETALOL HCL 200 MG PO TABS: 100.0000 mg | ORAL_TABLET | Freq: Two times a day (BID) | ORAL | Status: DC

## 2016-03-24 MED ORDER — DEXTROSE 50 % IV SOLN
100.0000 mL | Freq: Once | INTRAVENOUS | Status: AC
  Administered 2016-03-24: 100 mL via INTRAVENOUS
  Filled 2016-03-24: qty 100

## 2016-03-24 MED ORDER — LIDOCAINE HCL (PF) 1 % IJ SOLN: 5.0000 mL | INTRAMUSCULAR | Status: DC | PRN

## 2016-03-24 MED ORDER — HYDROXYZINE HCL 10 MG PO TABS
10.0000 mg | ORAL_TABLET | Freq: Four times a day (QID) | ORAL | Status: DC | PRN
  Administered 2016-03-24 – 2016-03-26 (×8): 10 mg via ORAL
  Filled 2016-03-24 (×8): qty 1

## 2016-03-24 MED ORDER — LIDOCAINE-PRILOCAINE 2.5-2.5 % EX CREA
1.0000 "application " | TOPICAL_CREAM | CUTANEOUS | Status: DC | PRN
  Filled 2016-03-24: qty 5

## 2016-03-24 MED ORDER — ACETAMINOPHEN 500 MG PO TABS
1000.0000 mg | ORAL_TABLET | Freq: Once | ORAL | Status: AC
Start: 2016-03-24 — End: 2016-03-24
  Administered 2016-03-24: 1000 mg via ORAL
  Filled 2016-03-24: qty 2

## 2016-03-24 MED ORDER — VANCOMYCIN HCL 10 G IV SOLR
1500.0000 mg | Freq: Once | INTRAVENOUS | Status: AC
  Administered 2016-03-24: 1500 mg via INTRAVENOUS
  Filled 2016-03-24: qty 1500

## 2016-03-24 MED ORDER — ASPIRIN EC 325 MG PO TBEC
325.0000 mg | DELAYED_RELEASE_TABLET | Freq: Every day | ORAL | Status: DC
  Administered 2016-03-25 – 2016-03-26 (×2): 325 mg via ORAL
  Filled 2016-03-24 (×3): qty 1

## 2016-03-24 MED ORDER — DEXTROSE 50 % IV SOLN: 50.0000 mL | Freq: Once | INTRAVENOUS | Status: DC

## 2016-03-24 MED ORDER — SEVELAMER CARBONATE 800 MG PO TABS
2400.0000 mg | ORAL_TABLET | Freq: Three times a day (TID) | ORAL | Status: DC
  Administered 2016-03-24 – 2016-03-26 (×5): 2400 mg via ORAL
  Filled 2016-03-24 (×7): qty 3

## 2016-03-24 MED ORDER — INSULIN ASPART 100 UNIT/ML IV SOLN
10.0000 [IU] | Freq: Once | INTRAVENOUS | Status: AC
  Administered 2016-03-24: 10 [IU] via INTRAVENOUS
  Filled 2016-03-24: qty 1

## 2016-03-24 MED ORDER — DEXTROSE 5 % IV SOLN
2.0000 g | Freq: Once | INTRAVENOUS | Status: AC
  Administered 2016-03-24: 2 g via INTRAVENOUS
  Filled 2016-03-24: qty 2

## 2016-03-24 MED ORDER — PENTAFLUOROPROP-TETRAFLUOROETH EX AERO: 1.0000 "application " | INHALATION_SPRAY | CUTANEOUS | Status: DC | PRN

## 2016-03-24 NOTE — ED Notes (Signed)
Pt states he just make a litter urine and is refusing to have a In and out cath a this time.

## 2016-03-24 NOTE — ED Notes (Signed)
At bedside with patient.  Patient refusing to lay on his back and allow this RN to get an accurate blood pressure.  Patient requesting something for itching.  Sent message to pharmacy to verify medications (see MAR).

## 2016-03-24 NOTE — Progress Notes (Signed)
Patient seen and examined  64 y.o. male with PMHx of CHF, gout, GERD, PE, A-fib, MI, CAD, PVD,HTN, diverticulosis, chronic thrombocytopenia and leukopenia with hepatosplenomegaly, history of A. fib not on anticoagulation secondary to hematoma and GI bleed and renal insufficiency who presents to the Emergency Department via EMS complaining of lethargy and fatigue. Per EMS pts blood sugar was 33 on arrival; after administering medication it increased to 113, then decreased to 69 upon ED arrival. Per nursing note, family called EMS because pt has not been eating well and has been weak.   Assessment and plan  1. Hypoglycemia - probably from poor oral intake. In the ER patient was found to have recurrent episodes of hypoglycemia for which patient has been started on D10. Closely follow CBGs until patient's blood sugar improves. Patient does not take any medications for diabetes at home 2. Elevated lactate levels - patient does not look infected. Likely in the setting of end-stage renal disease, no signs of infection, Follow blood cultures. Patient is presently afebrile. 3. ESRD on hemodialysis on Monday Wednesday and Friday - consulted nephrology for dialysis. 4. Nonischemic cardiomyopathy with last EF measured was 25-30% - appears euvolemic. Note that patient is receiving D10 at this time. Fluid management per nephrology. Troponin slightly abnormal secondary to end-stage renal disease 5. Hypertension on labetalol. 6. Chronic thrombocytopenia and leukopenia probably secondary to liver disease - follow CBC. 7. Mildly elevated troponin - denies any chest pain. Cycle cardiac markers.

## 2016-03-24 NOTE — ED Notes (Signed)
CRITICAL VALUE ALERT  Critical value received: Troponin 0.06

## 2016-03-24 NOTE — H&P (Signed)
History and Physical    Trevor [name] ZOX:[id number] DOB: 18-Sep-1950 DOA: 03/23/2016  PCP: [name], MD  Patient coming from: Home.  Chief Complaint: Lethargy.  HPI: Trevor [name] is a 65 y.o. male with ESRD on hemodialysis on Monday Wednesday and Friday, hypertension, chronic thrombocytopenia and leukopenia with hepatosplenomegaly, history of A. fib not on anticoagulation secondary to hematoma and GI bleed was brought to the ER after patient's family called EMS since patient was lethargic. As per the ER physician patient has not been eating well for last 2-3 days has become increasingly weak. Patient's blood sugar was found to be around 32 and EMS had given D50 for which patient became more alert awake. In the ER patient had recurrent hypoglycemia so D10 was started. Lactate was elevated but no signs of infection. Blood cultures were obtained and patient will be admitted for further management of hypoglycemia. Patient is not a diabetic and does not take any oral hypoglycemics. On my exam patient is alert awake denies any chest pain shortness of breath nausea vomiting abdominal pain or diarrhea. Patient states he has not missed dialysis.  ED Course: D50 was given a D10 started for hypoglycemia. Patient also received antibiotics for elevated lactate levels. At this time patient is afebrile.  Review of Systems: As per HPI, rest all negative.   Past Medical History  Diagnosis Date  . Anemia   . AVF (arteriovenous fistula) (HCC)     Left  . Secondary hyperparathyroidism (HCC)   . Hypovitaminosis D   . Hypertensive urgency     H/o  . CHF (congestive heart failure) (HCC)     EF 20-25%  . Exertional shortness of breath     "related to infection in my lungs right now" (05/03/2013)  . History of gout     "before I started doing the dialysis" (05/03/2013)  . Sleep apnea   . GERD (gastroesophageal reflux disease)   . Syncope     felt secondary to residual anesthesia the day before - 2D  echo unremarkable  . Pulmonary embolism (HCC)     with right DVT secondary to recent surgery  . Atrial fibrillation (HCC)     not on coumadin due to large spontaneous hematoma on back and anemia  . Myocardial infarction (HCC) 90's  . Coronary artery disease   . Dysrhythmia     afib  . Peripheral vascular disease (HCC)     dvt leg 12/14  . Hypertension   . ESRD (end stage renal disease) on dialysis (HCC)     [facility] mon/wed/fri  . Atrial flutter (HCC) 04/27/2013  . S/P repair of ventral hernia 03/21/2014  . Hereditary and idiopathic peripheral neuropathy 08/29/2015  . Diverticulosis   . Hepatosplenomegaly 10/18/2015    Workup per Dr Kaplan/ GI in Dec 2015 > abd pain improved after paracentesis x 2 (1.2L, 1.3L). Had negative hepA/ hep B/ hep C testing. For hepatosplenomagealy pt underwent transjugular liver biopsy by IR with hepatic vein wedge pressure measurement which showed elevated right heart and free hepatic venous pressures likely due to right heart failure.  There was no evidence of portal hypertension. Liver bx originally was reported as "End stage liver disease/ cirrhosis", then was corrected > "No evidence of cirrhosis".  The corrected liver biopsy result reads "SINUSOIDAL DILATATION WITH SCATTERED FOCI OF HEPATITIS".  Last GI visit was Aug 2016 for bloating/ abd pain, noted he had a normal gastric empty study.  -Right lobe liver lesion. Felt to  be hemangioma, Biopsy pending.  -thrombocytopenia. Dates back to 08/2013.  -ESRD. MWF HD.  -biventricular heart failure, acute right sided heart failure. Daily HD to address volume overload.    . Acute on chronic systolic right heart failure (HCC) 11/14/2015  . Elevated troponin 11/14/2015  . Renal insufficiency   . Rectal bleeding 11/2015    Past Surgical History  Procedure Laterality Date  . Av fistula placement Left     Dr. Charlean Sanfilippo; "I've had 2 on the left' (05/03/2013)  . Av fistula placement Right ~ 2011  . Knee arthroscopy Left   . Avgg  removal Right 05/04/2013    Procedure: REMOVAL OF ARTERIOVENOUS Fistula Right Arm;  Surgeon: [name], MD;  Location: [facility] OR;  Service: Vascular;  Laterality: Right;  . Insertion of dialysis catheter Right 05/04/2013    Procedure: INSERTION OF DIALYSIS CATHETER;  Surgeon: [name], MD;  Location: [facility] OR;  Service: Vascular;  Laterality: Right;  . Colonoscopy  10-29-2005    Hx: of  . Bascilic vein transposition Left 06/27/2013    Procedure: BASCILIC VEIN TRANSPOSITION;  Surgeon: [name], MD;  Location: [facility] OR;  Service: Vascular;  Laterality: Left;  . Cardioversion N/A 08/29/2013    Procedure: CARDIOVERSION;  Surgeon: [name], MD;  Location: [facility] ENDOSCOPY;  Service: Cardiovascular;  Laterality: N/A;  . Removal of a dialysis catheter  2/15  . Hernia repair  03/21/14    Umbilical hernia-Dr. Derrell Lolling  . Umbilical hernia repair N/A 03/21/2014    Procedure: LAPAROSCOPIC UMBILICAL HERNIA REPAIR WITH MESH;  Surgeon: Axel Filler, MD;  Location: [facility] OR;  Service: General;  Laterality: N/A;  . Insertion of mesh N/A 03/21/2014    Procedure: INSERTION OF MESH;  Surgeon: Axel Filler, MD;  Location: [facility] OR;  Service: General;  Laterality: N/A;  . Venogram Left 05/26/2013    Procedure: VENOGRAM;  Surgeon: Fransisco Hertz, MD;  Location: [facility] CATH LAB;  Service: Cardiovascular;  Laterality: Left;  . Cardiac catheterization N/A 11/15/2015    Procedure: Right Heart Cath;  Surgeon: Lennette Bihari, MD;  Location: [facility] INVASIVE CV LAB;  Service: Cardiovascular;  Laterality: N/A;  . Cardiac catheterization Right 11/15/2015    Procedure: Left Heart Cath;  Surgeon: Lennette Bihari, MD;  Location: [facility] INVASIVE CV LAB;  Service: Cardiovascular;  Laterality: Right;  . Colonoscopy N/A 11/30/2015    Procedure: COLONOSCOPY;  Surgeon: Meryl Dare, MD;  Location: [facility] ENDOSCOPY;  Service: Endoscopy;  Laterality: N/A;  . Esophagogastroduodenoscopy N/A 11/30/2015    Procedure: ESOPHAGOGASTRODUODENOSCOPY (EGD);   Surgeon: Meryl Dare, MD;  Location: [facility] ENDOSCOPY;  Service: Endoscopy;  Laterality: N/A;     reports that he quit smoking about 43 years ago. His smoking use included Cigarettes. He has a .125 pack-year smoking history. He has never used smokeless tobacco. He reports that he does not drink alcohol or use illicit drugs.  Allergies  Allergen Reactions  . Pork-Derived Products Nausea And Vomiting    Culture    Family History  Problem Relation Age of Onset  . Hypertension Father   . Kidney disease Father   . Allergies Father   . Deep vein thrombosis Sister   . Pulmonary embolism Sister   . Diabetes Paternal Grandmother     Prior to Admission medications   Medication Sig Start Date End Date Taking? Authorizing Provider  Alum & Mag Hydroxide-Simeth (GI COCKTAIL) SUSP suspension Take 30 mLs by mouth 3 (three) times daily as needed for  indigestion. Shake well. Patient not taking: Reported on 12/06/2015 11/17/15   Rodolph Bong, MD  B Complex-C (B-COMPLEX WITH VITAMIN C) tablet Take 1 tablet by mouth daily.    Historical Provider, MD  labetalol (NORMODYNE) 100 MG tablet Take 100 mg by mouth 2 (two) times daily. Patient takes 1/2 tablet daily    Historical Provider, MD  nitroGLYCERIN (NITROSTAT) 0.4 MG SL tablet Place 1 tablet (0.4 mg total) under the tongue every 5 (five) minutes as needed for chest pain. 11/17/15   Rodolph Bong, MD  Nutritional Supplements (FEEDING SUPPLEMENT, NEPRO CARB STEADY,) LIQD Take 237 mLs by mouth 2 (two) times daily between meals. 10/19/15   Nishant Dhungel, MD  oxyCODONE-acetaminophen (PERCOCET/ROXICET) 5-325 MG tablet Take 1 tablet by mouth every 6 (six) hours as needed. Pain 10/29/15   Historical Provider, MD  pantoprazole (PROTONIX) 40 MG tablet Take 1 tablet (40 mg total) by mouth 2 (two) times daily before a meal. Patient not taking: Reported on 12/20/2015 12/03/15   Zannie Cove, MD  sevelamer carbonate (RENVELA) 800 MG tablet Take 4 tablets (3,200  mg total) by mouth 3 (three) times daily with meals. Patient taking differently: Take 2,400 mg by mouth 3 (three) times daily with meals.  09/12/14   Stephani Police, PA-C    Physical Exam: Filed Vitals:   03/24/16 0430 03/24/16 0445 03/24/16 0500 03/24/16 0515  BP: 129/111 139/119 158/126 143/122  Pulse: 95 92 50 120  Temp:      TempSrc:      Resp: 34 19 37   Height:      Weight:      SpO2: 90% 93% 96% 94%      Constitutional: Not in distress. Filed Vitals:   03/24/16 0430 03/24/16 0445 03/24/16 0500 03/24/16 0515  BP: 129/111 139/119 158/126 143/122  Pulse: 95 92 50 120  Temp:      TempSrc:      Resp: 34 19 37   Height:      Weight:      SpO2: 90% 93% 96% 94%   Eyes: Anicteric no pallor. ENMT: No discharge from the ears eyes nose or mouth. Neck: No JVD appreciated no mass felt. Respiratory: No rhonchi or crepitations. Cardiovascular: S1 and S2 heard. Abdomen: Soft nontender bowel sounds present. Musculoskeletal: No edema. Skin: No rash. Neurologic: Alert awake oriented to time place and person. Moves all extremities. Psychiatric: Appears normal.   Labs on Admission: I have personally reviewed following labs and imaging studies  CBC:  Recent Labs Lab 03/24/16 0152 03/24/16 0200  WBC 6.3  --   NEUTROABS 4.7  --   HGB 14.2 16.3  HCT 41.2 48.0  MCV 80.8  --   PLT 122*  --    Basic Metabolic Panel:  Recent Labs Lab 03/24/16 0152 03/24/16 0200  NA 133* 134*  K 5.8* 5.8*  CL 98* 101  CO2 20*  --   GLUCOSE 86 84  BUN 66* 58*  CREATININE 7.82* 8.10*  CALCIUM 9.6  --    GFR: Estimated Creatinine Clearance: 10.9 mL/min (by C-G formula based on Cr of 8.1). Liver Function Tests:  Recent Labs Lab 03/24/16 0152  AST 61*  ALT 28  ALKPHOS 175*  BILITOT 3.6*  PROT 7.9  ALBUMIN 3.0*   No results for input(s): LIPASE, AMYLASE in the last 168 hours. No results for input(s): AMMONIA in the last 168 hours. Coagulation Profile: No results for  input(s): INR, PROTIME in the last 168 hours.  Cardiac Enzymes: No results for input(s): CKTOTAL, CKMB, CKMBINDEX, TROPONINI in the last 168 hours. BNP (last 3 results) No results for input(s): PROBNP in the last 8760 hours. HbA1C: No results for input(s): HGBA1C in the last 72 hours. CBG:  Recent Labs Lab 03/23/16 2319 03/24/16 0458  GLUCAP 69 45*   Lipid Profile: No results for input(s): CHOL, HDL, LDLCALC, TRIG, CHOLHDL, LDLDIRECT in the last 72 hours. Thyroid Function Tests: No results for input(s): TSH, T4TOTAL, FREET4, T3FREE, THYROIDAB in the last 72 hours. Anemia Panel: No results for input(s): VITAMINB12, FOLATE, FERRITIN, TIBC, IRON, RETICCTPCT in the last 72 hours. Urine analysis:    Component Value Date/Time   COLORURINE YELLOW 05/03/2013 0603   APPEARANCEUR CLEAR 05/03/2013 0603   LABSPEC 1.012 05/03/2013 0603   PHURINE 8.5* 05/03/2013 0603   GLUCOSEU NEGATIVE 05/03/2013 0603   HGBUR SMALL* 05/03/2013 0603   BILIRUBINUR NEGATIVE 05/03/2013 0603   KETONESUR NEGATIVE 05/03/2013 0603   PROTEINUR >300* 05/03/2013 0603   UROBILINOGEN 0.2 05/03/2013 0603   NITRITE NEGATIVE 05/03/2013 0603   LEUKOCYTESUR NEGATIVE 05/03/2013 0603   Sepsis Labs: @LABRCNTIP (procalcitonin:4,lacticidven:4) )No results found for this or any previous visit (from the past 240 hour(s)).   Radiological Exams on Admission: Ct Head Wo Contrast  03/24/2016  CLINICAL DATA:  Increase weakness. EXAM: CT HEAD WITHOUT CONTRAST TECHNIQUE: Contiguous axial images were obtained from the base of the skull through the vertex without intravenous contrast. COMPARISON:  April 27, 2013 FINDINGS: Mucous retention cysts or polyps are seen in the maxillary sinuses. Paranasal sinuses and mastoid air cells are otherwise within normal limits. The bones and extracranial soft tissues are within normal limits. No subdural, epidural, or subarachnoid hemorrhage. The cerebellum, brainstem, and basal cisterns are within  normal limits. Ventricles and sulci are unremarkable. No acute cortical ischemia or infarct. No mass, mass effect, or midline shift. IMPRESSION: No acute intracranial process identified. Electronically Signed   By: Gerome Sam III M.D   On: 03/24/2016 01:27   Dg Chest Port 1 View  03/24/2016  CLINICAL DATA:  Acute mental status change EXAM: PORTABLE CHEST 1 VIEW COMPARISON:  November 28, 2015 FINDINGS: Stable cardiomegaly. The hila and mediastinum are unchanged. No pulmonary nodules, masses, or focal infiltrates. IMPRESSION: Stable cardiomegaly.  No acute abnormality. Electronically Signed   By: Gerome Sam III M.D   On: 03/24/2016 01:22     Assessment/Plan Principal Problem:   Hypoglycemia Active Problems:   Essential hypertension   ESRD on hemodialysis (HCC)   DCM- EF 25-30% by echo 09/04/14   Hepatosplenomegaly   NICM (nonischemic cardiomyopathy) (HCC)    1. Hypoglycemia - probably from poor oral intake. In the ER patient was found to have recurrent episodes of hypoglycemia for which patient has been started on D10. Closely follow CBGs until patient's blood sugar improves. May need CT scan of abdomen and pelvis if hypoglycemia does not improve. 2. Elevated lactate levels - patient does not look infected. Follow blood cultures. Patient is presently afebrile. 3. ESRD on hemodialysis on Monday Wednesday and Friday - consult nephrology for dialysis. 4. Nonischemic cardiomyopathy with last EF measured was 25-30% - appears euvolemic. Note that patient is receiving D10 at this time. Fluid management per nephrology. 5. Hypertension on labetalol. 6. Chronic thrombocytopenia and leukopenia probably secondary to liver disease - follow CBC. 7. Mildly elevated troponin - denies any chest pain. Cycle cardiac markers.    DVT prophylaxis: SCDs. Patient is allergic to heparin. Code Status: Full code.  Family Communication: No  family at the bedside.  Disposition Plan: Home.  Consults called:  None.  Admission status: Observation. Strep done.    Eduard Clos MD [facility] Pager [phone].  If 7PM-7AM, please contact night-coverage [email or web address] Password [facility]  03/24/2016, 5:21 AM

## 2016-03-24 NOTE — Procedures (Signed)
I was present at this dialysis session. I have reviewed the session itself and made appropriate changes.   Filed Weights   03/23/16 2326  Weight: 92.987 kg (205 lb)     Recent Labs Lab 03/24/16 0612  NA 132*  K 5.7*  CL 96*  CO2 20*  GLUCOSE 127*  BUN 70*  CREATININE 7.86*  CALCIUM 9.8     Recent Labs Lab 03/24/16 0152 03/24/16 0200 03/24/16 0612  WBC 6.3  --  5.3  NEUTROABS 4.7  --  3.8  HGB 14.2 16.3 14.3  HCT 41.2 48.0 41.5  MCV 80.8  --  80.6  PLT 120*  --  118*    Scheduled Meds: . aspirin EC  325 mg Oral Daily  . dextrose  50 mL Intravenous Once  . doxercalciferol  2 mcg Intravenous Q M,W,F-HD  . feeding supplement (NEPRO CARB STEADY)  237 mL Oral BID BM  . labetalol  100 mg Oral BID  . multivitamin  1 tablet Oral QHS  . sevelamer carbonate  2,400 mg Oral TID WC   Continuous Infusions: . dextrose 25 mL/hr at 03/24/16 0918   PRN Meds:.acetaminophen **OR** acetaminophen, hydrOXYzine, nitroGLYCERIN, ondansetron **OR** ondansetron (ZOFRAN) IV   Irena Cords,  MD 03/24/2016, 11:03 AM

## 2016-03-24 NOTE — ED Notes (Signed)
MD called RN back advised of results.

## 2016-03-24 NOTE — Progress Notes (Signed)
Pt refuses to put telemetry monitor on at this time due to itching per pt. Anti itch medication given to pt. Will continue to monitor and reapply monitor. George Hugh RN

## 2016-03-24 NOTE — Progress Notes (Signed)
Trevor [name] is a 65 Y/O AA male with ESRD on hemodialysis MWF at [facility]. Complex past medical history significant for biventricular heart failure, hypertension, Afib/Aflutter(patient refuses coumadin), pancytopenia, GERD, syncope, OSA, hepatosplenomegaly, ascites, chronic abdominal pain and distention, liver biopsy 08/2014: sinusoidal dilatation, scattered foci hepatitis negative for cirrhosis. Patient awoke yesterday not feeling well, says he has not been eating well. Came to ED for evaluation of fatigue and weakness. BS 33 per EMS report, was given D50W per EMS-BS ^113 then fell again on arrival to ED. Currently patient is on D10W gtt for BS support. ^ Lactic acid, 3.6, ^ troponins 0.06, K+ 5.7, Last BS 124. He is admitted as observation patient for hypoglycemia.  We will order hemodialysis for today on schedule. Last HD 03/21/16-no issues in HD center. Please notify us if patient status changes to in-patient and we will consult formally.   Alonna Buckler, [facility] [phone] (Pager)  I have seen and examined this patient and agree with plan as outlined by Alonna Buckler, NP-C. [name],MD 03/24/2016 10:55 AM

## 2016-03-24 NOTE — ED Notes (Signed)
MD paged to notify of lactic

## 2016-03-24 NOTE — ED Notes (Signed)
Patient transported to CT 

## 2016-03-25 DIAGNOSIS — I10 Essential (primary) hypertension: Secondary | ICD-10-CM

## 2016-03-25 DIAGNOSIS — I420 Dilated cardiomyopathy: Secondary | ICD-10-CM

## 2016-03-25 DIAGNOSIS — E162 Hypoglycemia, unspecified: Secondary | ICD-10-CM | POA: Diagnosis not present

## 2016-03-25 LAB — COMPREHENSIVE METABOLIC PANEL
ALBUMIN: 2.9 g/dL — AB (ref 3.5–5.0)
ALT: 27 U/L (ref 17–63)
ANION GAP: 16 — AB (ref 5–15)
AST: 54 U/L — ABNORMAL HIGH (ref 15–41)
Alkaline Phosphatase: 167 U/L — ABNORMAL HIGH (ref 38–126)
BUN: 38 mg/dL — ABNORMAL HIGH (ref 6–20)
CALCIUM: 9.7 mg/dL (ref 8.9–10.3)
CO2: 23 mmol/L (ref 22–32)
Chloride: 95 mmol/L — ABNORMAL LOW (ref 101–111)
Creatinine, Ser: 5.64 mg/dL — ABNORMAL HIGH (ref 0.61–1.24)
GFR calc non Af Amer: 10 mL/min — ABNORMAL LOW (ref >60)
GFR, EST AFRICAN AMERICAN: 11 mL/min — AB (ref >60)
GLUCOSE: 87 mg/dL (ref 65–99)
POTASSIUM: 4.7 mmol/L (ref 3.5–5.1)
SODIUM: 134 mmol/L — AB (ref 135–145)
TOTAL PROTEIN: 7.9 g/dL (ref 6.5–8.1)
Total Bilirubin: 2.9 mg/dL — ABNORMAL HIGH (ref 0.3–1.2)

## 2016-03-25 LAB — GLUCOSE, CAPILLARY
GLUCOSE-CAPILLARY: 120 mg/dL — AB (ref 65–99)
GLUCOSE-CAPILLARY: 123 mg/dL — AB (ref 65–99)
GLUCOSE-CAPILLARY: 134 mg/dL — AB (ref 65–99)
GLUCOSE-CAPILLARY: 137 mg/dL — AB (ref 65–99)
GLUCOSE-CAPILLARY: 93 mg/dL (ref 65–99)
Glucose-Capillary: 119 mg/dL — ABNORMAL HIGH (ref 65–99)
Glucose-Capillary: 94 mg/dL (ref 65–99)
Glucose-Capillary: 150 mg/dL — ABNORMAL HIGH (ref 65–99)

## 2016-03-25 LAB — CBC
HCT: 41 % (ref 39.0–52.0)
Hemoglobin: 14.4 g/dL (ref 13.0–17.0)
MCH: 28.1 pg (ref 26.0–34.0)
MCHC: 35.1 g/dL (ref 30.0–36.0)
MCV: 79.9 fL (ref 78.0–100.0)
Platelets: 117 10*3/uL — ABNORMAL LOW (ref 150–400)
RBC: 5.13 MIL/uL (ref 4.22–5.81)
RDW: 19.6 % — AB (ref 11.5–15.5)
WBC: 5.2 10*3/uL (ref 4.0–10.5)

## 2016-03-25 MED ORDER — HEPARIN SODIUM (PORCINE) 5000 UNIT/ML IJ SOLN: 5000.0000 [IU] | Freq: Three times a day (TID) | INTRAMUSCULAR | Status: DC

## 2016-03-25 MED ORDER — DOXERCALCIFEROL 4 MCG/2ML IV SOLN
1.0000 ug | INTRAVENOUS | Status: DC
  Administered 2016-03-26: 1 ug via INTRAVENOUS
  Filled 2016-03-25: qty 2

## 2016-03-25 MED ORDER — OXYCODONE-ACETAMINOPHEN 5-325 MG PO TABS
1.0000 | ORAL_TABLET | Freq: Four times a day (QID) | ORAL | Status: DC | PRN
  Administered 2016-03-25 – 2016-03-26 (×2): 1 via ORAL
  Filled 2016-03-25 (×2): qty 1

## 2016-03-25 NOTE — Progress Notes (Signed)
[facility] PROGRESS NOTE  Trevor [name] GYB:[id number] DOB: 1951/06/14 DOA: 03/23/2016   PCP: [name], MD     Assessment/Plan: Principal Problem:   Hypoglycemia Active Problems:   Essential hypertension   ESRD on hemodialysis (HCC)   DCM- EF 25-30% by echo 09/04/14   Hepatosplenomegaly   NICM (nonischemic cardiomyopathy) (HCC)   Trevor [name] is a 65 Y/O AA male with ESRD on hemodialysis MWF at [facility]. Complex past medical history significant for biventricular heart failure, hypertension, Afib/Aflutter(patient refuses coumadin), pancytopenia, GERD, syncope, OSA, hepatosplenomegaly, ascites, chronic abdominal pain and distention, liver biopsy 08/2014: sinusoidal dilatation, scattered foci hepatitis negative for cirrhosis. Patient awoke yesterday not feeling well, says he has not been eating well. Came to ED for evaluation of fatigue and weakness. BS 33 per EMS report, was given D50W per EMS-BS ^113 then fell again on arrival to ED. Currently patient is on D10W gtt for BS support. ^ Lactic acid, 3.6, ^ troponins 0.06, K+ 5.7, Last BS 124. He is admitted as observation patient for hypoglycemia.  Assessment and plan  1. Hypoglycemia - probably from poor oral intake. In the ER patient was found to have recurrent episodes of hypoglycemia for which patient has been started on D10. Closely follow CBGs off D10 today, until patient's blood sugar improves. Check A1C  2. Elevated lactate levels - patient does not look infected. Likely in the setting of end-stage renal disease, no signs of infection, Follow blood cultures. Patient is presently afebrile. 3. ESRD on hemodialysis on Monday Wednesday and Friday - consulted nephrology for dialysis. 4. Nonischemic cardiomyopathy with last EF measured was 25-30% - appears euvolemic.   Fluid management per nephrology. Troponin slightly abnormal secondary to end-stage renal disease 5. Hypertension on  labetalol. 6. Chronic thrombocytopenia and leukopenia probably secondary to liver disease - follow CBC. 7. Mildly elevated troponin - denies any chest pain. Cycle cardiac markers.    DVT prophylaxsis heparin  Code Status:  Full code      Family Communication: Discussed in detail with the patient, all imaging results, lab results explained to the patient   Disposition Plan:  Possibly in am      Consultants:  nephrology  Procedures:  HD   Antibiotics: Anti-infectives    Start     Dose/Rate Route Frequency Ordered Stop   03/24/16 0230  vancomycin (VANCOCIN) 1,500 mg in sodium chloride 0.9 % 500 mL IVPB     1,500 mg 250 mL/hr over 120 Minutes Intravenous  Once 03/24/16 0221 03/24/16 0754   03/24/16 0230  ceFEPIme (MAXIPIME) 2 g in dextrose 5 % 50 mL IVPB     2 g 100 mL/hr over 30 Minutes Intravenous  Once 03/24/16 0221 03/24/16 0336         HPI/Subjective: Complaining of  Itching , refused to wear tele   Objective: Filed Vitals:   03/24/16 1728 03/24/16 2034 03/25/16 0423 03/25/16 1021  BP:  118/92 117/100 141/94  Pulse:   87 89  Temp: 97.6 F (36.4 C) 98.4 F (36.9 C) 98.2 F (36.8 C)   TempSrc: Oral Oral Oral   Resp:  19 21   Height:      Weight: 90.538 kg (199 lb 9.6 oz)     SpO2:  100% 100%     Intake/Output Summary (Last 24 hours) at 03/25/16 1130 Last data filed at 03/24/16 1600  Gross per 24 hour  Intake      0 ml  Output   3000 ml  Net  -3000 ml    Exam:  Examination:  Respiratory: No rhonchi or crepitations. Cardiovascular: S1 and S2 heard. Abdomen: Soft nontender bowel sounds present. Musculoskeletal: No edema. Skin: No rash. Neurologic: Alert awake oriented to time place and person. Moves all extremities. Psychiatric: Appears normal.    Data Reviewed: I have personally reviewed following labs and imaging studies  Micro Results No results found for this or any previous visit (from the past 240 hour(s)).  Radiology  Reports Ct Head Wo Contrast  03/24/2016  CLINICAL DATA:  Increase weakness. EXAM: CT HEAD WITHOUT CONTRAST TECHNIQUE: Contiguous axial images were obtained from the base of the skull through the vertex without intravenous contrast. COMPARISON:  April 27, 2013 FINDINGS: Mucous retention cysts or polyps are seen in the maxillary sinuses. Paranasal sinuses and mastoid air cells are otherwise within normal limits. The bones and extracranial soft tissues are within normal limits. No subdural, epidural, or subarachnoid hemorrhage. The cerebellum, brainstem, and basal cisterns are within normal limits. Ventricles and sulci are unremarkable. No acute cortical ischemia or infarct. No mass, mass effect, or midline shift. IMPRESSION: No acute intracranial process identified. Electronically Signed   By: Gerome Sam III M.D   On: 03/24/2016 01:27   Dg Chest Port 1 View  03/24/2016  CLINICAL DATA:  Acute mental status change EXAM: PORTABLE CHEST 1 VIEW COMPARISON:  November 28, 2015 FINDINGS: Stable cardiomegaly. The hila and mediastinum are unchanged. No pulmonary nodules, masses, or focal infiltrates. IMPRESSION: Stable cardiomegaly.  No acute abnormality. Electronically Signed   By: Gerome Sam III M.D   On: 03/24/2016 01:22     CBC  Recent Labs Lab 03/24/16 0152 03/24/16 0200 03/24/16 0612 03/24/16 1944 03/25/16 0526  WBC 6.3  --  5.3 3.6* 5.2  HGB 14.2 16.3 14.3 14.0 14.4  HCT 41.2 48.0 41.5 40.7 41.0  PLT 120*  --  118* 99* 117*  MCV 80.8  --  80.6 80.0 79.9  MCH 27.8  --  27.8 27.5 28.1  MCHC 34.5  --  34.5 34.4 35.1  RDW 19.1*  --  19.4* 19.0* 19.6*  LYMPHSABS 1.1  --  1.0  --   --   MONOABS 0.5  --  0.5  --   --   EOSABS 0.0  --  0.0  --   --   BASOSABS 0.0  --  0.0  --   --     Chemistries   Recent Labs Lab 03/24/16 0152 03/24/16 0200 03/24/16 0612 03/24/16 1944 03/25/16 0526  NA 133* 134* 132* 134* 134*  K 5.8* 5.8* 5.7* 3.8 4.7  CL 98* 101 96* 96* 95*  CO2 20*  --  20*  25 23  GLUCOSE 86 84 127* 108* 87  BUN 66* 58* 70* 31* 38*  CREATININE 7.82* 8.10* 7.86* 5.05* 5.64*  CALCIUM 9.6  --  9.8 9.5 9.7  AST 61*  --  68*  --  54*  ALT 28  --  28  --  27  ALKPHOS 175*  --  168*  --  167*  BILITOT 3.6*  --  3.5*  --  2.9*   ------------------------------------------------------------------------------------------------------------------ estimated creatinine clearance is 15.4 mL/min (by C-G formula based on Cr of 5.64). ------------------------------------------------------------------------------------------------------------------ No results for input(s): HGBA1C in the last 72 hours. ------------------------------------------------------------------------------------------------------------------ No results for input(s): CHOL, HDL, LDLCALC, TRIG, CHOLHDL, LDLDIRECT in the last 72 hours. ------------------------------------------------------------------------------------------------------------------ No results for input(s): TSH, T4TOTAL, T3FREE, THYROIDAB in the  last 72 hours.  Invalid input(s): FREET3 ------------------------------------------------------------------------------------------------------------------ No results for input(s): VITAMINB12, FOLATE, FERRITIN, TIBC, IRON, RETICCTPCT in the last 72 hours.  Coagulation profile No results for input(s): INR, PROTIME in the last 168 hours.  No results for input(s): DDIMER in the last 72 hours.  Cardiac Enzymes  Recent Labs Lab 03/24/16 0612 03/24/16 0727 03/24/16 1944  TROPONINI 0.06* 0.06* 0.06*   ------------------------------------------------------------------------------------------------------------------ Invalid input(s): POCBNP   CBG:  Recent Labs Lab 03/24/16 2141 03/25/16 0007 03/25/16 0216 03/25/16 0428 03/25/16 0738  GLUCAP 130* 134* 119* 94 93       Studies: Ct Head Wo Contrast  03/24/2016  CLINICAL DATA:  Increase weakness. EXAM: CT HEAD WITHOUT CONTRAST  TECHNIQUE: Contiguous axial images were obtained from the base of the skull through the vertex without intravenous contrast. COMPARISON:  April 27, 2013 FINDINGS: Mucous retention cysts or polyps are seen in the maxillary sinuses. Paranasal sinuses and mastoid air cells are otherwise within normal limits. The bones and extracranial soft tissues are within normal limits. No subdural, epidural, or subarachnoid hemorrhage. The cerebellum, brainstem, and basal cisterns are within normal limits. Ventricles and sulci are unremarkable. No acute cortical ischemia or infarct. No mass, mass effect, or midline shift. IMPRESSION: No acute intracranial process identified. Electronically Signed   By: Gerome Sam III M.D   On: 03/24/2016 01:27   Dg Chest Port 1 View  03/24/2016  CLINICAL DATA:  Acute mental status change EXAM: PORTABLE CHEST 1 VIEW COMPARISON:  November 28, 2015 FINDINGS: Stable cardiomegaly. The hila and mediastinum are unchanged. No pulmonary nodules, masses, or focal infiltrates. IMPRESSION: Stable cardiomegaly.  No acute abnormality. Electronically Signed   By: Gerome Sam III M.D   On: 03/24/2016 01:22      Lab Results  Component Value Date   HGBA1C 5.4 11/14/2015   HGBA1C  12/11/2007    5.7 (NOTE)   The ADA recommends the following therapeutic goals for glycemic   control related to Hgb A1C measurement:   Goal of Therapy:   < 7.0% Hgb A1C   Action Suggested:  > 8.0% Hgb A1C   Ref:  Diabetes Care, 22, Suppl. 1, 1999   Lab Results  Component Value Date   LDLCALC 32 11/14/2015   CREATININE 5.64* 03/25/2016       Scheduled Meds: . aspirin EC  325 mg Oral Daily  . dextrose  50 mL Intravenous Once  . doxercalciferol  2 mcg Intravenous Q M,W,F-HD  . feeding supplement (NEPRO CARB STEADY)  237 mL Oral BID BM  . labetalol  50 mg Oral BID  . multivitamin  1 tablet Oral QHS  . sevelamer carbonate  2,400 mg Oral TID WC   Continuous Infusions:    LOS: 1 day    Time spent: >30  MINS    [facility]  [facility] Pager [phone]. If 7PM-7AM, please contact night-coverage at [email or web address], password [facility] 03/25/2016, 11:30 AM  LOS: 1 day

## 2016-03-25 NOTE — Care Management Obs Status (Signed)
MEDICARE OBSERVATION STATUS NOTIFICATION   Patient Details  Name: MITCHAEL PARLEE MRN: [id number] Date of Birth: February 08, 1951   Medicare Observation Status Notification Given:  Yes    Gala Lewandowsky, RN 03/25/2016, 4:17 PM

## 2016-03-26 DIAGNOSIS — N186 End stage renal disease: Secondary | ICD-10-CM | POA: Diagnosis not present

## 2016-03-26 DIAGNOSIS — E162 Hypoglycemia, unspecified: Secondary | ICD-10-CM | POA: Diagnosis not present

## 2016-03-26 DIAGNOSIS — I420 Dilated cardiomyopathy: Secondary | ICD-10-CM | POA: Diagnosis not present

## 2016-03-26 DIAGNOSIS — Z992 Dependence on renal dialysis: Secondary | ICD-10-CM

## 2016-03-26 LAB — COMPREHENSIVE METABOLIC PANEL
ALBUMIN: 2.7 g/dL — AB (ref 3.5–5.0)
ALT: 26 U/L (ref 17–63)
ANION GAP: 14 (ref 5–15)
AST: 45 U/L — AB (ref 15–41)
Alkaline Phosphatase: 170 U/L — ABNORMAL HIGH (ref 38–126)
BUN: 54 mg/dL — AB (ref 6–20)
CHLORIDE: 95 mmol/L — AB (ref 101–111)
CO2: 25 mmol/L (ref 22–32)
Calcium: 9.5 mg/dL (ref 8.9–10.3)
Creatinine, Ser: 6.78 mg/dL — ABNORMAL HIGH (ref 0.61–1.24)
GFR calc Af Amer: 9 mL/min — ABNORMAL LOW (ref >60)
GFR, EST NON AFRICAN AMERICAN: 8 mL/min — AB (ref >60)
GLUCOSE: 88 mg/dL (ref 65–99)
POTASSIUM: 4.4 mmol/L (ref 3.5–5.1)
Sodium: 134 mmol/L — ABNORMAL LOW (ref 135–145)
Total Bilirubin: 2.6 mg/dL — ABNORMAL HIGH (ref 0.3–1.2)
Total Protein: 7.3 g/dL (ref 6.5–8.1)

## 2016-03-26 LAB — GLUCOSE, CAPILLARY
GLUCOSE-CAPILLARY: 81 mg/dL (ref 65–99)
GLUCOSE-CAPILLARY: 113 mg/dL — AB (ref 65–99)
Glucose-Capillary: 105 mg/dL — ABNORMAL HIGH (ref 65–99)

## 2016-03-26 LAB — CBC
HCT: 38.4 % — ABNORMAL LOW (ref 39.0–52.0)
Hemoglobin: 13.3 g/dL (ref 13.0–17.0)
MCH: 27.8 pg (ref 26.0–34.0)
MCHC: 34.6 g/dL (ref 30.0–36.0)
MCV: 80.3 fL (ref 78.0–100.0)
PLATELETS: 128 10*3/uL — AB (ref 150–400)
RBC: 4.78 MIL/uL (ref 4.22–5.81)
RDW: 19.8 % — AB (ref 11.5–15.5)
WBC: 4.3 10*3/uL (ref 4.0–10.5)

## 2016-03-26 LAB — HEMOGLOBIN A1C
Hgb A1c MFr Bld: 5.2 % (ref 4.8–5.6)
MEAN PLASMA GLUCOSE: 103 mg/dL

## 2016-03-26 MED ORDER — NEPRO/CARBSTEADY PO LIQD: 237.0000 mL | Freq: Two times a day (BID) | ORAL | Status: AC

## 2016-03-26 MED ORDER — DOXERCALCIFEROL 4 MCG/2ML IV SOLN
INTRAVENOUS | Status: AC
  Administered 2016-03-26: 1 ug via INTRAVENOUS
  Filled 2016-03-26: qty 2

## 2016-03-26 MED ORDER — HYDROXYZINE HCL 10 MG PO TABS: 10.0000 mg | ORAL_TABLET | Freq: Four times a day (QID) | ORAL | Status: AC | PRN

## 2016-03-26 MED ORDER — SODIUM CHLORIDE 0.9 % IV SOLN: 100.0000 mL | INTRAVENOUS | Status: DC | PRN

## 2016-03-26 MED ORDER — ASPIRIN 325 MG PO TBEC: 325.0000 mg | DELAYED_RELEASE_TABLET | Freq: Every day | ORAL | Status: AC

## 2016-03-26 NOTE — Progress Notes (Signed)
Pt refusing labs again. MD made aware.

## 2016-03-26 NOTE — Progress Notes (Signed)
Ambulated with patient in the hall. Pt walking independently. SO CP, no SOB. Pt on room air, no desaturations.

## 2016-03-26 NOTE — Plan of Care (Signed)
Problem: Education: Goal: Knowledge of [facility] Education information/materials will improve Outcome: Progressing Patient aware of plan of care.  RN provided medication education on all medications administered thus far this shift.  Patient stated understanding.  Patient did complain of generalized pain this shift, PRN 1 tablet Percocet administered per MD order, with relief of pain per patient (see MAR and flowsheets for detailed information).  RN instructed patient to call and wait for staff assistance prior to getting out of bed due to equipment tethering patient.  Patient stated understanding.

## 2016-03-26 NOTE — Procedures (Signed)
I was present at this dialysis session. I have reviewed the session itself and made appropriate changes.  He feels well and plan is for discharge to home today.   Filed Weights   03/24/16 1728 03/26/16 0406 03/26/16 0700  Weight: 90.538 kg (199 lb 9.6 oz) 92.262 kg (203 lb 6.4 oz) 92.5 kg (203 lb 14.8 oz)     Recent Labs Lab 03/24/16 1944  03/26/16 0544  NA 134*  < > 134*  K 3.8  < > 4.4  CL 96*  < > 95*  CO2 25  < > 25  GLUCOSE 108*  < > 88  BUN 31*  < > 54*  CREATININE 5.05*  < > 6.78*  CALCIUM 9.5  < > 9.5  PHOS 3.8  --   --   < > = values in this interval not displayed.   Recent Labs Lab 03/24/16 0152  03/24/16 0612 03/24/16 1944 03/25/16 0526 03/26/16 0544  WBC 6.3  --  5.3 3.6* 5.2 4.3  NEUTROABS 4.7  --  3.8  --   --   --   HGB 14.2  < > 14.3 14.0 14.4 13.3  HCT 41.2  < > 41.5 40.7 41.0 38.4*  MCV 80.8  --  80.6 80.0 79.9 80.3  PLT 120*  --  118* 99* 117* 128*  < > = values in this interval not displayed.  Scheduled Meds: . aspirin EC  325 mg Oral Daily  . dextrose  50 mL Intravenous Once  . doxercalciferol  1 mcg Intravenous Q M,W,F-HD  . feeding supplement (NEPRO CARB STEADY)  237 mL Oral BID BM  . heparin subcutaneous  5,000 Units Subcutaneous Q8H  . labetalol  50 mg Oral BID  . multivitamin  1 tablet Oral QHS  . sevelamer carbonate  2,400 mg Oral TID WC   Continuous Infusions:  PRN Meds:.sodium chloride, sodium chloride, sodium chloride, sodium chloride, acetaminophen **OR** acetaminophen, chlorpheniramine-HYDROcodone, hydrOXYzine, lidocaine (PF), lidocaine-prilocaine, nitroGLYCERIN, ondansetron **OR** ondansetron (ZOFRAN) IV, oxyCODONE-acetaminophen, pentafluoroprop-tetrafluoroeth   Irena Cords,  MD 03/26/2016, 9:13 AM

## 2016-03-26 NOTE — Progress Notes (Signed)
Pt refusing labs at this time.

## 2016-03-26 NOTE — Discharge Summary (Signed)
Physician Discharge Summary  Trevor [name] MRN: [id number] DOB/AGE: 01-29-1951 65 y.o.  PCP: Maximino Greenland, MD   Admit date: 03/23/2016 Discharge date: 03/26/2016  Discharge Diagnoses:     Principal Problem:   Hypoglycemia Active Problems:   Essential hypertension   ESRD on hemodialysis ([place])   DCM- EF 25-30% by echo 09/04/14   Hepatosplenomegaly   NICM (nonischemic cardiomyopathy) ([place])    Follow-up recommendations Follow-up with PCP in 3-5 days , including all  additional recommended appointments as below Follow-up CBC, CMP in 3-5 days pcp requested to check levels of C-peptide, beta hydroxybutyric acid, proinsulin. If these are abnormal patient may need a referral to endocrinology      Current Discharge Medication List    START taking these medications   Details  aspirin EC 325 MG EC tablet Take 1 tablet (325 mg total) by mouth daily. Qty: 30 tablet, Refills: 0    hydrOXYzine (ATARAX/VISTARIL) 10 MG tablet Take 1 tablet (10 mg total) by mouth every 6 (six) hours as needed for itching. Qty: 60 tablet, Refills: 0      CONTINUE these medications which have CHANGED   Details  Nutritional Supplements (FEEDING SUPPLEMENT, NEPRO CARB STEADY,) LIQD Take 237 mLs by mouth 2 (two) times daily between meals. Qty: 60 Can, Refills: 0      CONTINUE these medications which have NOT CHANGED   Details  albuterol (PROVENTIL HFA;VENTOLIN HFA) 108 (90 Base) MCG/ACT inhaler Inhale 2 puffs into the lungs every 4 (four) hours as needed for wheezing or shortness of breath.    B Complex-C (B-COMPLEX WITH VITAMIN C) tablet Take 1 tablet by mouth daily.    labetalol (NORMODYNE) 200 MG tablet Take 200 mg by mouth 2 (two) times daily. Refills: 0    montelukast (SINGULAIR) 10 MG tablet Take 10 mg by mouth every evening. Refills: 0    nitroGLYCERIN (NITROSTAT) 0.4 MG SL tablet Place 1 tablet (0.4 mg total) under the tongue every 5 (five) minutes as needed for chest pain. Qty: 15  tablet, Refills: 0    oxyCODONE-acetaminophen (PERCOCET/ROXICET) 5-325 MG tablet Take 1 tablet by mouth every 6 (six) hours as needed. Pain Refills: 0      STOP taking these medications     hydrOXYzine (VISTARIL) 25 MG capsule      sevelamer carbonate (RENVELA) 800 MG tablet          Discharge Condition: Stable    Discharge Instructions Get Medicines reviewed and adjusted: Please take all your medications with you for your next visit with your Primary MD  Please request your Primary MD to go over all hospital tests and procedure/radiological results at the follow up, please ask your Primary MD to get all Hospital records sent to his/her office.  If you experience worsening of your admission symptoms, develop shortness of breath, life threatening emergency, suicidal or homicidal thoughts you must seek medical attention immediately by calling 911 or calling your MD immediately if symptoms less severe.  You must read complete instructions/literature along with all the possible adverse reactions/side effects for all the Medicines you take and that have been prescribed to you. Take any new Medicines after you have completely understood and accpet all the possible adverse reactions/side effects.   Do not drive when taking Pain medications.   Do not take more than prescribed Pain, Sleep and Anxiety Medications  Special Instructions: If you have smoked or chewed Tobacco in the last 2 yrs please stop smoking, stop any regular Alcohol and or  any Recreational drug use.  Wear Seat belts while driving.  Please note  You were cared for by a hospitalist during your hospital stay. Once you are discharged, your primary care physician will handle any further medical issues. Please note that NO REFILLS for any discharge medications will be authorized once you are discharged, as it is imperative that you return to your primary care physician (or establish a relationship with a primary care  physician if you do not have one) for your aftercare needs so that they can reassess your need for medications and monitor your lab values.  Discharge Instructions    Diet - low sodium heart healthy    Complete by:  As directed      Increase activity slowly    Complete by:  As directed             Allergies  Allergen Reactions  . Pork-Derived Products Other (See Comments)    No pork products for religious reasons      Disposition: 01-Home or Self Care   Consults: * Nephrology     Significant Diagnostic Studies:  Ct Head Wo Contrast  03/24/2016  CLINICAL DATA:  Increase weakness. EXAM: CT HEAD WITHOUT CONTRAST TECHNIQUE: Contiguous axial images were obtained from the base of the skull through the vertex without intravenous contrast. COMPARISON:  April 27, 2013 FINDINGS: Mucous retention cysts or polyps are seen in the maxillary sinuses. Paranasal sinuses and mastoid air cells are otherwise within normal limits. The bones and extracranial soft tissues are within normal limits. No subdural, epidural, or subarachnoid hemorrhage. The cerebellum, brainstem, and basal cisterns are within normal limits. Ventricles and sulci are unremarkable. No acute cortical ischemia or infarct. No mass, mass effect, or midline shift. IMPRESSION: No acute intracranial process identified. Electronically Signed   By: Dorise Bullion III M.D   On: 03/24/2016 01:27   Dg Chest Port 1 View  03/24/2016  CLINICAL DATA:  Acute mental status change EXAM: PORTABLE CHEST 1 VIEW COMPARISON:  November 28, 2015 FINDINGS: Stable cardiomegaly. The hila and mediastinum are unchanged. No pulmonary nodules, masses, or focal infiltrates. IMPRESSION: Stable cardiomegaly.  No acute abnormality. Electronically Signed   By: Dorise Bullion III M.D   On: 03/24/2016 01:22        Filed Weights   03/24/16 1728 03/26/16 0406 03/26/16 0700  Weight: 90.538 kg (199 lb 9.6 oz) 92.262 kg (203 lb 6.4 oz) 92.5 kg (203 lb 14.8 oz)      Microbiology: Recent Results (from the past 240 hour(s))  Blood Culture (routine x 2)     Status: None (Preliminary result)   Collection Time: 03/24/16  1:52 AM  Result Value Ref Range Status   Specimen Description BLOOD RIGHT HAND  Final   Special Requests BOTTLES DRAWN AEROBIC AND ANAEROBIC 5CC   Final   Culture NO GROWTH 1 DAY  Final   Report Status PENDING  Incomplete  Blood Culture (routine x 2)     Status: None (Preliminary result)   Collection Time: 03/24/16  2:05 AM  Result Value Ref Range Status   Specimen Description BLOOD RIGHT HAND  Final   Special Requests BOTTLES DRAWN AEROBIC AND ANAEROBIC 5CC   Final   Culture NO GROWTH 1 DAY  Final   Report Status PENDING  Incomplete       Blood Culture    Component Value Date/Time   SDES BLOOD RIGHT HAND 03/24/2016 0205   SPECREQUEST BOTTLES DRAWN AEROBIC AND ANAEROBIC 5CC  03/24/2016  0205   CULT NO GROWTH 1 DAY 03/24/2016 0205   REPTSTATUS PENDING 03/24/2016 0205      Labs: Results for orders placed or performed during the hospital encounter of 03/23/16 (from the past 48 hour(s))  CBG monitoring, ED     Status: Abnormal   Collection Time: 03/24/16 11:36 AM  Result Value Ref Range   Glucose-Capillary 105 (H) 65 - 99 mg/dL  Troponin I     Status: Abnormal   Collection Time: 03/24/16  7:44 PM  Result Value Ref Range   Troponin I 0.06 (HH) <0.03 ng/mL    Comment: CRITICAL VALUE NOTED.  VALUE IS CONSISTENT WITH PREVIOUSLY REPORTED AND CALLED VALUE.  Renal function panel     Status: Abnormal   Collection Time: 03/24/16  7:44 PM  Result Value Ref Range   Sodium 134 (L) 135 - 145 mmol/L   Potassium 3.8 3.5 - 5.1 mmol/L    Comment: NO VISIBLE HEMOLYSIS DELTA CHECK NOTED    Chloride 96 (L) 101 - 111 mmol/L   CO2 25 22 - 32 mmol/L   Glucose, Bld 108 (H) 65 - 99 mg/dL   BUN 31 (H) 6 - 20 mg/dL   Creatinine, Ser 5.05 (H) 0.61 - 1.24 mg/dL    Comment: DELTA CHECK NOTED   Calcium 9.5 8.9 - 10.3 mg/dL   Phosphorus  3.8 2.5 - 4.6 mg/dL   Albumin 2.7 (L) 3.5 - 5.0 g/dL   GFR calc non Af Amer 11 (L) >60 mL/min   GFR calc Af Amer 13 (L) >60 mL/min    Comment: (NOTE) The eGFR has been calculated using the CKD EPI equation. This calculation has not been validated in all clinical situations. eGFR's persistently <60 mL/min signify possible Chronic Kidney Disease.    Anion gap 13 5 - 15  CBC     Status: Abnormal   Collection Time: 03/24/16  7:44 PM  Result Value Ref Range   WBC 3.6 (L) 4.0 - 10.5 K/uL   RBC 5.09 4.22 - 5.81 MIL/uL   Hemoglobin 14.0 13.0 - 17.0 g/dL   HCT 40.7 39.0 - 52.0 %   MCV 80.0 78.0 - 100.0 fL   MCH 27.5 26.0 - 34.0 pg   MCHC 34.4 30.0 - 36.0 g/dL   RDW 19.0 (H) 11.5 - 15.5 %   Platelets 99 (L) 150 - 400 K/uL    Comment: CONSISTENT WITH PREVIOUS RESULT  Glucose, capillary     Status: Abnormal   Collection Time: 03/24/16  8:32 PM  Result Value Ref Range   Glucose-Capillary 132 (H) 65 - 99 mg/dL  Glucose, capillary     Status: Abnormal   Collection Time: 03/24/16  9:41 PM  Result Value Ref Range   Glucose-Capillary 130 (H) 65 - 99 mg/dL  Glucose, capillary     Status: Abnormal   Collection Time: 03/25/16 12:07 AM  Result Value Ref Range   Glucose-Capillary 134 (H) 65 - 99 mg/dL  Glucose, capillary     Status: Abnormal   Collection Time: 03/25/16  2:16 AM  Result Value Ref Range   Glucose-Capillary 119 (H) 65 - 99 mg/dL  Glucose, capillary     Status: None   Collection Time: 03/25/16  4:28 AM  Result Value Ref Range   Glucose-Capillary 94 65 - 99 mg/dL  CBC     Status: Abnormal   Collection Time: 03/25/16  5:26 AM  Result Value Ref Range   WBC 5.2 4.0 - 10.5 K/uL   RBC  5.13 4.22 - 5.81 MIL/uL   Hemoglobin 14.4 13.0 - 17.0 g/dL   HCT 41.0 39.0 - 52.0 %   MCV 79.9 78.0 - 100.0 fL   MCH 28.1 26.0 - 34.0 pg   MCHC 35.1 30.0 - 36.0 g/dL   RDW 19.6 (H) 11.5 - 15.5 %   Platelets 117 (L) 150 - 400 K/uL  Comprehensive metabolic panel     Status: Abnormal   Collection  Time: 03/25/16  5:26 AM  Result Value Ref Range   Sodium 134 (L) 135 - 145 mmol/L   Potassium 4.7 3.5 - 5.1 mmol/L    Comment: DELTA CHECK NOTED   Chloride 95 (L) 101 - 111 mmol/L   CO2 23 22 - 32 mmol/L   Glucose, Bld 87 65 - 99 mg/dL   BUN 38 (H) 6 - 20 mg/dL   Creatinine, Ser 5.64 (H) 0.61 - 1.24 mg/dL   Calcium 9.7 8.9 - 10.3 mg/dL   Total Protein 7.9 6.5 - 8.1 g/dL   Albumin 2.9 (L) 3.5 - 5.0 g/dL   AST 54 (H) 15 - 41 U/L   ALT 27 17 - 63 U/L   Alkaline Phosphatase 167 (H) 38 - 126 U/L   Total Bilirubin 2.9 (H) 0.3 - 1.2 mg/dL   GFR calc non Af Amer 10 (L) >60 mL/min   GFR calc Af Amer 11 (L) >60 mL/min    Comment: (NOTE) The eGFR has been calculated using the CKD EPI equation. This calculation has not been validated in all clinical situations. eGFR's persistently <60 mL/min signify possible Chronic Kidney Disease.    Anion gap 16 (H) 5 - 15  Hemoglobin A1c     Status: None   Collection Time: 03/25/16  5:26 AM  Result Value Ref Range   Hgb A1c MFr Bld 5.2 4.8 - 5.6 %    Comment: (NOTE)         Pre-diabetes: 5.7 - 6.4         Diabetes: >6.4         Glycemic control for adults with diabetes: <7.0    Mean Plasma Glucose 103 mg/dL    Comment: (NOTE) Performed At: [facility] [place], [place] [id number] Lindon Romp MD YI:[id number]   Glucose, capillary     Status: None   Collection Time: 03/25/16  7:38 AM  Result Value Ref Range   Glucose-Capillary 93 65 - 99 mg/dL  Glucose, capillary     Status: Abnormal   Collection Time: 03/25/16 11:28 AM  Result Value Ref Range   Glucose-Capillary 137 (H) 65 - 99 mg/dL  Glucose, capillary     Status: Abnormal   Collection Time: 03/25/16  4:29 PM  Result Value Ref Range   Glucose-Capillary 123 (H) 65 - 99 mg/dL  Glucose, capillary     Status: Abnormal   Collection Time: 03/25/16  7:49 PM  Result Value Ref Range   Glucose-Capillary 150 (H) 65 - 99 mg/dL  Glucose, capillary     Status: Abnormal    Collection Time: 03/25/16 11:52 PM  Result Value Ref Range   Glucose-Capillary 120 (H) 65 - 99 mg/dL  Glucose, capillary     Status: None   Collection Time: 03/26/16  4:10 AM  Result Value Ref Range   Glucose-Capillary 81 65 - 99 mg/dL  CBC     Status: Abnormal   Collection Time: 03/26/16  5:44 AM  Result Value Ref Range   WBC 4.3 4.0 - 10.5  K/uL   RBC 4.78 4.22 - 5.81 MIL/uL   Hemoglobin 13.3 13.0 - 17.0 g/dL   HCT 38.4 (L) 39.0 - 52.0 %   MCV 80.3 78.0 - 100.0 fL   MCH 27.8 26.0 - 34.0 pg   MCHC 34.6 30.0 - 36.0 g/dL   RDW 19.8 (H) 11.5 - 15.5 %   Platelets 128 (L) 150 - 400 K/uL  Comprehensive metabolic panel     Status: Abnormal   Collection Time: 03/26/16  5:44 AM  Result Value Ref Range   Sodium 134 (L) 135 - 145 mmol/L   Potassium 4.4 3.5 - 5.1 mmol/L   Chloride 95 (L) 101 - 111 mmol/L   CO2 25 22 - 32 mmol/L   Glucose, Bld 88 65 - 99 mg/dL   BUN 54 (H) 6 - 20 mg/dL   Creatinine, Ser 6.78 (H) 0.61 - 1.24 mg/dL   Calcium 9.5 8.9 - 10.3 mg/dL   Total Protein 7.3 6.5 - 8.1 g/dL   Albumin 2.7 (L) 3.5 - 5.0 g/dL   AST 45 (H) 15 - 41 U/L   ALT 26 17 - 63 U/L   Alkaline Phosphatase 170 (H) 38 - 126 U/L   Total Bilirubin 2.6 (H) 0.3 - 1.2 mg/dL   GFR calc non Af Amer 8 (L) >60 mL/min   GFR calc Af Amer 9 (L) >60 mL/min    Comment: (NOTE) The eGFR has been calculated using the CKD EPI equation. This calculation has not been validated in all clinical situations. eGFR's persistently <60 mL/min signify possible Chronic Kidney Disease.    Anion gap 14 5 - 15  Glucose, capillary     Status: Abnormal   Collection Time: 03/26/16  9:51 AM  Result Value Ref Range   Glucose-Capillary 113 (H) 65 - 99 mg/dL   Comment 1 Notify RN      Lipid Panel     Component Value Date/Time   CHOL 72 11/14/2015 0345   TRIG 47 11/14/2015 0345   HDL 31* 11/14/2015 0345   CHOLHDL 2.3 11/14/2015 0345   VLDL 9 11/14/2015 0345   LDLCALC 32 11/14/2015 0345     Lab Results  Component  Value Date   HGBA1C 5.2 03/25/2016   HGBA1C 5.4 11/14/2015   HGBA1C  12/11/2007    5.7 (NOTE)   The ADA recommends the following therapeutic goals for glycemic   control related to Hgb A1C measurement:   Goal of Therapy:   < 7.0% Hgb A1C   Action Suggested:  > 8.0% Hgb A1C   Ref:  Diabetes Care, 22, Suppl. 1, 1999       Trevor [name] is a 65 Y/O AA male with ESRD on hemodialysis MWF at [facility]. Complex past medical history significant for biventricular heart failure, hypertension, Afib/Aflutter(patient refuses coumadin), pancytopenia, GERD, syncope, OSA, hepatosplenomegaly, ascites, chronic abdominal pain and distention, liver biopsy 08/2014: sinusoidal dilatation, scattered foci hepatitis negative for cirrhosis. Patient awoke yesterday not feeling well, says he has not been eating well. Came to ED for evaluation of fatigue and weakness. BS 33 per EMS report, was given D50W per EMS-BS ^113 then fell again on arrival to ED. Currently patient is on D10W gtt for BS support. ^ Lactic acid, 3.6, ^ troponins 0.06, K+ 5.7, Last BS 124. He is admitted as observation patient for hypoglycemia.  Assessment and plan  1. Hypoglycemia - probably from poor oral intake. Patient is not a diabetic. Ordered C-peptide, beta hydroxybutyrate acid, proinsulin insulin  ratio, these results are pending. In the ER patient was found to have recurrent episodes of hypoglycemia for which patient was started on D10. Now  Off D10 and without any recurrent episodes of hypoglycemia . Hemoglobin A1c 5.2. Patient advised to have small frequent meals 2. Elevated lactate levels - patient does not look infected. Likely in the setting of end-stage renal disease, no signs of infection, Follow blood cultures. Patient is presently afebrile. 3. ESRD on hemodialysis on Monday Wednesday and Friday - consulted nephrology for dialysis. Last hemodialysis 7/19. 4. Nonischemic cardiomyopathy with last EF measured was  25-30% - appears euvolemic. Fluid management per nephrology. Troponin slightly abnormal secondary to end-stage renal disease. Patient started on aspirin 5. Hypertension on labetalol. 6. Chronic thrombocytopenia and leukopenia probably secondary to liver disease - follow CBC. 7. Mildly elevated troponin - recent 2-D echo was reassuring, troponins remained flat around 0.06. Patient chest pain-free      Discharge Exam:    Blood pressure 131/94, pulse 92, temperature 98.1 F (36.7 C), temperature source Oral, resp. rate 28, height '5\' 11"'  (1.803 m), weight 92.5 kg (203 lb 14.8 oz), SpO2 96 %.   ENMT: No discharge from the ears eyes nose or mouth. Neck: No JVD appreciated no mass felt. Respiratory: No rhonchi or crepitations. Cardiovascular: S1 and S2 heard. Abdomen: Soft nontender bowel sounds present. Musculoskeletal: No edema. Skin: No rash. Neurologic: Alert awake oriented to time place and person. Moves all extremities. Psychiatric: Appears normal.   Follow-up Information    Follow up with Maximino Greenland, MD. Schedule an appointment as soon as possible for a visit in 3 days.   Specialty:  Internal Medicine   Contact information:   [address] [place] [address] [phone]       Signed: Reyne Dumas 03/26/2016, 10:30 AM        Time spent >45 mins

## 2016-03-28 DIAGNOSIS — N186 End stage renal disease: Secondary | ICD-10-CM | POA: Diagnosis not present

## 2016-03-28 DIAGNOSIS — D631 Anemia in chronic kidney disease: Secondary | ICD-10-CM | POA: Diagnosis not present

## 2016-03-28 DIAGNOSIS — N2581 Secondary hyperparathyroidism of renal origin: Secondary | ICD-10-CM | POA: Diagnosis not present

## 2016-03-29 LAB — CULTURE, BLOOD (ROUTINE X 2)
CULTURE: NO GROWTH
Culture: NO GROWTH

## 2016-03-31 DIAGNOSIS — D631 Anemia in chronic kidney disease: Secondary | ICD-10-CM | POA: Diagnosis not present

## 2016-03-31 DIAGNOSIS — N186 End stage renal disease: Secondary | ICD-10-CM | POA: Diagnosis not present

## 2016-03-31 DIAGNOSIS — N2581 Secondary hyperparathyroidism of renal origin: Secondary | ICD-10-CM | POA: Diagnosis not present

## 2016-04-02 DIAGNOSIS — N186 End stage renal disease: Secondary | ICD-10-CM | POA: Diagnosis not present

## 2016-04-02 DIAGNOSIS — D631 Anemia in chronic kidney disease: Secondary | ICD-10-CM | POA: Diagnosis not present

## 2016-04-02 DIAGNOSIS — N2581 Secondary hyperparathyroidism of renal origin: Secondary | ICD-10-CM | POA: Diagnosis not present

## 2016-04-04 DIAGNOSIS — N2581 Secondary hyperparathyroidism of renal origin: Secondary | ICD-10-CM | POA: Diagnosis not present

## 2016-04-04 DIAGNOSIS — N186 End stage renal disease: Secondary | ICD-10-CM | POA: Diagnosis not present

## 2016-04-04 DIAGNOSIS — D631 Anemia in chronic kidney disease: Secondary | ICD-10-CM | POA: Diagnosis not present

## 2016-04-07 DIAGNOSIS — I10 Essential (primary) hypertension: Secondary | ICD-10-CM | POA: Diagnosis not present

## 2016-04-07 DIAGNOSIS — Z992 Dependence on renal dialysis: Secondary | ICD-10-CM | POA: Diagnosis not present

## 2016-04-07 DIAGNOSIS — N2581 Secondary hyperparathyroidism of renal origin: Secondary | ICD-10-CM | POA: Diagnosis not present

## 2016-04-07 DIAGNOSIS — D631 Anemia in chronic kidney disease: Secondary | ICD-10-CM | POA: Diagnosis not present

## 2016-04-07 DIAGNOSIS — N186 End stage renal disease: Secondary | ICD-10-CM | POA: Diagnosis not present

## 2016-04-09 DIAGNOSIS — N2581 Secondary hyperparathyroidism of renal origin: Secondary | ICD-10-CM | POA: Diagnosis not present

## 2016-04-09 DIAGNOSIS — N186 End stage renal disease: Secondary | ICD-10-CM | POA: Diagnosis not present

## 2016-04-09 DIAGNOSIS — D631 Anemia in chronic kidney disease: Secondary | ICD-10-CM | POA: Diagnosis not present

## 2016-04-11 DIAGNOSIS — N2581 Secondary hyperparathyroidism of renal origin: Secondary | ICD-10-CM | POA: Diagnosis not present

## 2016-04-11 DIAGNOSIS — N186 End stage renal disease: Secondary | ICD-10-CM | POA: Diagnosis not present

## 2016-04-11 DIAGNOSIS — D631 Anemia in chronic kidney disease: Secondary | ICD-10-CM | POA: Diagnosis not present

## 2016-04-14 DIAGNOSIS — D631 Anemia in chronic kidney disease: Secondary | ICD-10-CM | POA: Diagnosis not present

## 2016-04-14 DIAGNOSIS — N186 End stage renal disease: Secondary | ICD-10-CM | POA: Diagnosis not present

## 2016-04-14 DIAGNOSIS — N2581 Secondary hyperparathyroidism of renal origin: Secondary | ICD-10-CM | POA: Diagnosis not present

## 2016-04-15 ENCOUNTER — Ambulatory Visit (INDEPENDENT_AMBULATORY_CARE_PROVIDER_SITE_OTHER): Payer: Medicare Other | Admitting: Allergy and Immunology

## 2016-04-15 ENCOUNTER — Encounter: Payer: Self-pay | Admitting: Allergy and Immunology

## 2016-04-15 VITALS — BP 130/80 | HR 80 | Temp 97.7°F | Resp 18 | Ht 73.5 in | Wt 203.0 lb

## 2016-04-15 DIAGNOSIS — L299 Pruritus, unspecified: Secondary | ICD-10-CM

## 2016-04-15 DIAGNOSIS — J310 Chronic rhinitis: Secondary | ICD-10-CM | POA: Insufficient documentation

## 2016-04-15 MED ORDER — FLUTICASONE PROPIONATE 50 MCG/ACT NA SUSP: NASAL | 2 refills | Status: AC

## 2016-04-15 NOTE — Assessment & Plan Note (Signed)
Non-allergic rhinitis.  All seasonal and perennial aeroallergen skin tests are negative despite a positive histamine control.  Intranasal steroids and intranasal antihistamines are effective for symptoms associated with non-allergic rhinitis, whereas second generation antihistamines such as cetirizine, loratadine and fexofenadine have been found to be ineffective for this condition.  A prescription has been provided for fluticasone nasal spray, one spray per nostril 1-2 times daily as needed. Proper nasal spray technique has been discussed and demonstrated.  I have also recommended nasal saline spray (i.e. Simply Saline) as needed prior to medicated nasal sprays. 

## 2016-04-15 NOTE — Assessment & Plan Note (Signed)
The patient's history suggests that the pruritus is secondary to renal failure.  Environmental and food allergen skin tests today were negative despite a positive histamine control, further supporting that this is not an IgE mediated process.  I have recommended moisturizing the skin with Aquaphor or Vaseline jelly.  Continue diphenhydramine or hydroxyzine.  Second generation antihistamines must be used with care due to renal failure and I will leave consideration of these medications to the nephrologist.  Fingernails should be kept trimmed short to limit further skin damage.

## 2016-04-15 NOTE — Patient Instructions (Signed)
Pruritus The patient's history suggests that the pruritus is secondary to renal failure.  Environmental and food allergen skin tests today were negative despite a positive histamine control, further supporting that this is not an IgE mediated process.  I have recommended moisturizing the skin with Aquaphor or Vaseline jelly.  Continue diphenhydramine or hydroxyzine.  Second generation antihistamines must be used with care due to renal failure and I will leave consideration of these medications to the nephrologist.  Fingernails should be kept trimmed short to limit further skin damage.  Chronic rhinitis Non-allergic rhinitis.  All seasonal and perennial aeroallergen skin tests are negative despite a positive histamine control.  Intranasal steroids and intranasal antihistamines are effective for symptoms associated with non-allergic rhinitis, whereas second generation antihistamines such as cetirizine, loratadine and fexofenadine have been found to be ineffective for this condition.  A prescription has been provided for fluticasone nasal spray, one spray per nostril 1-2 times daily as needed. Proper nasal spray technique has been discussed and demonstrated.  I have also recommended nasal saline spray (i.e. Simply Saline) as needed prior to medicated nasal sprays.   Return if symptoms worsen or fail to improve.

## 2016-04-15 NOTE — Progress Notes (Signed)
New Patient Note  RE: Trevor [name] MRN: [id number] DOB: 1950/12/13 Date of Office Visit: 04/15/2016  Referring provider: Dorothyann Peng, MD Primary care provider: [name], MD  Chief Complaint: Pruritus and Allergic Rhinitis    History of present illness: Trevor [name] is a 65 y.o. male presenting today for consultation of pruritus.  He reports that since renal failure requiring dialysis in 2011 he has experienced generalized pruritus.  The pruritus has progressed over the past few years.  He denies rash. He describes the pruritus as "constant"and "deep" which is both itchy and burning.  At times, the pruritus is so intense that he feels like "ripping the skin off."  He scratches specific areas so intensely that he breaks the skin which leads to scarring.  No specific medication, food, or environmental triggers have been identified. Diphenhydramine and hydroxyzine provide some degree of relief.  He believes that the pruritus may have improved somewhat after having started Renvela.  Bernette Redbird experiences "constant" rhinorrhea. No significant seasonal symptom variation has been noted nor have specific environmental triggers been identified.   Assessment and plan: Pruritus The patient's history suggests that the pruritus is secondary to renal failure.  Environmental and food allergen skin tests today were negative despite a positive histamine control, further supporting that this is not an IgE mediated process.  I have recommended moisturizing the skin with Aquaphor or Vaseline jelly.  Continue diphenhydramine or hydroxyzine.  Second generation antihistamines must be used with care due to renal failure and I will leave consideration of these medications to the nephrologist.  Fingernails should be kept trimmed short to limit further skin damage.  Chronic rhinitis Non-allergic rhinitis.  All seasonal and perennial aeroallergen skin tests are negative despite a positive histamine control.   Intranasal steroids and intranasal antihistamines are effective for symptoms associated with non-allergic rhinitis, whereas second generation antihistamines such as cetirizine, loratadine and fexofenadine have been found to be ineffective for this condition.  A prescription has been provided for fluticasone nasal spray, one spray per nostril 1-2 times daily as needed. Proper nasal spray technique has been discussed and demonstrated.  I have also recommended nasal saline spray (i.e. Simply Saline) as needed prior to medicated nasal sprays.   Meds ordered this encounter  Medications  . fluticasone (FLONASE) 50 MCG/ACT nasal spray    Sig: 1-2 Sprays per nostril daily as needed.    Dispense:  137 g    Refill:  2    Diagnositics: Environmental skin testing:  Negative despite a positive histamine control. Food allergen skin testing:  Negative despite a positive histamine control.    Physical examination: Blood pressure 130/80, pulse 80, temperature 97.7 F (36.5 C), temperature source Oral, resp. rate 18, height 6' 1.5" (1.867 m), weight 203 lb (92.1 kg), SpO2 97 %.  General: Alert, interactive, in no acute distress. HEENT: TMs pearly gray, turbinates mildly edematous with clear discharge, post-pharynx mildly erythematous. Neck: Supple without lymphadenopathy. Lungs: Clear to auscultation without wheezing, rhonchi or rales. CV: Normal S1, S2 without murmurs. Abdomen: Nondistended, nontender. Skin: Dry, ashy skin.  Hyperpigmented/scarred lesions scattered over his chest, abdomen, shoulders, and back. Extremities:  No clubbing, cyanosis or edema. Neuro:   Grossly intact.  Review of systems:  Review of Systems  Constitutional: Negative for chills and fever.  HENT: Negative for nosebleeds.   Eyes: Negative for blurred vision.  Respiratory: Negative for hemoptysis.   Cardiovascular: Negative for chest pain.  Gastrointestinal: Negative for constipation and diarrhea.  Musculoskeletal:  Negative for joint pain and myalgias.  Skin: Positive for itching.  Neurological: Negative for dizziness.  Endo/Heme/Allergies: Does not bruise/bleed easily.    Past medical history:  Past Medical History:  Diagnosis Date  . Acute on chronic systolic right heart failure (HCC) 11/14/2015  . Anemia   . Atrial fibrillation (HCC)    not on coumadin due to large spontaneous hematoma on back and anemia  . Atrial flutter (HCC) 04/27/2013  . AVF (arteriovenous fistula) (HCC)    Left  . CHF (congestive heart failure) (HCC)    EF 20-25%  . Coronary artery disease   . Diverticulosis   . Dysrhythmia    afib  . Elevated troponin 11/14/2015  . ESRD (end stage renal disease) on dialysis (HCC)    [facility] mon/wed/fri  . Exertional shortness of breath    "related to infection in my lungs right now" (05/03/2013)  . GERD (gastroesophageal reflux disease)   . Hepatosplenomegaly 10/18/2015   Workup per Dr Kaplan/ GI in Dec 2015 > abd pain improved after paracentesis x 2 (1.2L, 1.3L). Had negative hepA/ hep B/ hep C testing. For hepatosplenomagealy pt underwent transjugular liver biopsy by IR with hepatic vein wedge pressure measurement which showed elevated right heart and free hepatic venous pressures likely due to right heart failure.  There was no evidence of portal hypertension. Liver bx originally was reported as "End stage liver disease/ cirrhosis", then was corrected > "No evidence of cirrhosis".  The corrected liver biopsy result reads "SINUSOIDAL DILATATION WITH SCATTERED FOCI OF HEPATITIS".  Last GI visit was Aug 2016 for bloating/ abd pain, noted he had a normal gastric empty study.  -Right lobe liver lesion. Felt to be hemangioma, Biopsy pending.  -thrombocytopenia. Dates back to 08/2013.  -ESRD. MWF HD.  -biventricular heart failure, acute right sided heart failure. Daily HD to address volume overload.    Marland Kitchen Hereditary and idiopathic peripheral neuropathy 08/29/2015  . History of gout    "before I  started doing the dialysis" (05/03/2013)  . Hypertension   . Hypertensive urgency    H/o  . Hypovitaminosis D   . Myocardial infarction (HCC) 90's  . Peripheral vascular disease (HCC)    dvt leg 12/14  . Pulmonary embolism (HCC)    with right DVT secondary to recent surgery  . Rectal bleeding 11/2015  . Renal insufficiency   . S/P repair of ventral hernia 03/21/2014  . Secondary hyperparathyroidism (HCC)   . Sleep apnea   . Syncope    felt secondary to residual anesthesia the day before - 2D echo unremarkable    Past surgical history:  Past Surgical History:  Procedure Laterality Date  . AV FISTULA PLACEMENT Left    Dr. Charlean Sanfilippo; "I've had 2 on the left' (05/03/2013)  . AV FISTULA PLACEMENT Right ~ 2011  . AVGG REMOVAL Right 05/04/2013   Procedure: REMOVAL OF ARTERIOVENOUS Fistula Right Arm;  Surgeon: [name], MD;  Location: [facility] OR;  Service: Vascular;  Laterality: Right;  . BASCILIC VEIN TRANSPOSITION Left 06/27/2013   Procedure: BASCILIC VEIN TRANSPOSITION;  Surgeon: [name], MD;  Location: [facility] OR;  Service: Vascular;  Laterality: Left;  . CARDIAC CATHETERIZATION N/A 11/15/2015   Procedure: Right Heart Cath;  Surgeon: Lennette Bihari, MD;  Location: [facility] INVASIVE CV LAB;  Service: Cardiovascular;  Laterality: N/A;  . CARDIAC CATHETERIZATION Right 11/15/2015   Procedure: Left Heart Cath;  Surgeon: Lennette Bihari, MD;  Location: [facility] INVASIVE CV LAB;  Service: Cardiovascular;  Laterality: Right;  . CARDIOVERSION N/A 08/29/2013   Procedure: CARDIOVERSION;  Surgeon: [name], MD;  Location: [facility] ENDOSCOPY;  Service: Cardiovascular;  Laterality: N/A;  . COLONOSCOPY  10-29-2005   Hx: of  . COLONOSCOPY N/A 11/30/2015   Procedure: COLONOSCOPY;  Surgeon: Meryl Dare, MD;  Location: [facility] ENDOSCOPY;  Service: Endoscopy;  Laterality: N/A;  . ESOPHAGOGASTRODUODENOSCOPY N/A 11/30/2015   Procedure: ESOPHAGOGASTRODUODENOSCOPY (EGD);  Surgeon: Meryl Dare, MD;  Location: [facility] ENDOSCOPY;   Service: Endoscopy;  Laterality: N/A;  . HERNIA REPAIR  03/21/14   Umbilical hernia-Dr. Derrell Lolling  . INSERTION OF DIALYSIS CATHETER Right 05/04/2013   Procedure: INSERTION OF DIALYSIS CATHETER;  Surgeon: [name], MD;  Location: [facility] OR;  Service: Vascular;  Laterality: Right;  . INSERTION OF MESH N/A 03/21/2014   Procedure: INSERTION OF MESH;  Surgeon: Axel Filler, MD;  Location: [facility] OR;  Service: General;  Laterality: N/A;  . KNEE ARTHROSCOPY Left   . REMOVAL OF A DIALYSIS CATHETER  2/15  . UMBILICAL HERNIA REPAIR N/A 03/21/2014   Procedure: LAPAROSCOPIC UMBILICAL HERNIA REPAIR WITH MESH;  Surgeon: Axel Filler, MD;  Location: [facility] OR;  Service: General;  Laterality: N/A;  . VENOGRAM Left 05/26/2013   Procedure: VENOGRAM;  Surgeon: Fransisco Hertz, MD;  Location: [facility] CATH LAB;  Service: Cardiovascular;  Laterality: Left;    Family history: Family History  Problem Relation Age of Onset  . Hypertension Father   . Kidney disease Father   . Allergies Father   . Deep vein thrombosis Sister   . Pulmonary embolism Sister   . Diabetes Paternal Grandmother     Social history: Social History   Social History  . Marital status: Significant Other    Spouse name: N/A  . Number of children: 4  . Years of education: N/A   Occupational History  . Disabled    Social History Main Topics  . Smoking status: Former Smoker    Packs/day: 0.25    Years: 0.50    Types: Cigarettes    Quit date: 09/08/1972  . Smokeless tobacco: Never Used  . Alcohol use No     Comment: 05/03/2013 "haven't had a glass of wine in ~ 3 months or so; sometimes will have one w/dinner"  . Drug use: No  . Sexual activity: Yes    Birth control/ protection: None   Other Topics Concern  . Not on file   Social History Narrative   Former smoker- quit over 30 yrs ago   Patient is on disability   Environmental History: The patient lives in a 63 year old apartment with carpeting throughout and central air/heat.  There  are 2 cats in the apartment which have access to his bedroom.  He is a nonsmoker.    Medication List       Accurate as of 04/15/16  6:10 PM. Always use your most recent med list.          albuterol 108 (90 Base) MCG/ACT inhaler Commonly known as:  PROVENTIL HFA;VENTOLIN HFA Inhale 2 puffs into the lungs every 4 (four) hours as needed for wheezing or shortness of breath.   aspirin 325 MG EC tablet Take 1 tablet (325 mg total) by mouth daily.   B-complex with vitamin C tablet Take 1 tablet by mouth daily.   feeding supplement (NEPRO CARB STEADY) Liqd Take 237 mLs by mouth 2 (two) times daily between meals.   fluticasone 50 MCG/ACT nasal spray Commonly known as:  FLONASE 1-2 Sprays per  nostril daily as needed.   hydrOXYzine 10 MG tablet Commonly known as:  ATARAX/VISTARIL Take 1 tablet (10 mg total) by mouth every 6 (six) hours as needed for itching.   labetalol 200 MG tablet Commonly known as:  NORMODYNE Take 200 mg by mouth 2 (two) times daily.   montelukast 10 MG tablet Commonly known as:  SINGULAIR Take 10 mg by mouth every evening.   nitroGLYCERIN 0.4 MG SL tablet Commonly known as:  NITROSTAT Place 1 tablet (0.4 mg total) under the tongue every 5 (five) minutes as needed for chest pain.   oxyCODONE-acetaminophen 5-325 MG tablet Commonly known as:  PERCOCET/ROXICET Take 1 tablet by mouth every 6 (six) hours as needed. Pain   RENVELA 800 MG tablet Generic drug:  sevelamer carbonate       Known medication allergies: Allergies  Allergen Reactions  . Pork-Derived Products Other (See Comments)    No pork products for religious reasons    I appreciate the opportunity to take part in [place] care. Please do not hesitate to contact me with questions.  Sincerely,   R. Jorene Guest, MD

## 2016-04-16 DIAGNOSIS — D631 Anemia in chronic kidney disease: Secondary | ICD-10-CM | POA: Diagnosis not present

## 2016-04-16 DIAGNOSIS — N2581 Secondary hyperparathyroidism of renal origin: Secondary | ICD-10-CM | POA: Diagnosis not present

## 2016-04-16 DIAGNOSIS — N186 End stage renal disease: Secondary | ICD-10-CM | POA: Diagnosis not present

## 2016-04-17 DIAGNOSIS — N186 End stage renal disease: Secondary | ICD-10-CM | POA: Diagnosis not present

## 2016-04-17 DIAGNOSIS — Z992 Dependence on renal dialysis: Secondary | ICD-10-CM | POA: Diagnosis not present

## 2016-04-17 DIAGNOSIS — R05 Cough: Secondary | ICD-10-CM | POA: Diagnosis not present

## 2016-04-17 DIAGNOSIS — I120 Hypertensive chronic kidney disease with stage 5 chronic kidney disease or end stage renal disease: Secondary | ICD-10-CM | POA: Diagnosis not present

## 2016-04-18 DIAGNOSIS — N2581 Secondary hyperparathyroidism of renal origin: Secondary | ICD-10-CM | POA: Diagnosis not present

## 2016-04-18 DIAGNOSIS — D631 Anemia in chronic kidney disease: Secondary | ICD-10-CM | POA: Diagnosis not present

## 2016-04-18 DIAGNOSIS — N186 End stage renal disease: Secondary | ICD-10-CM | POA: Diagnosis not present

## 2016-04-21 DIAGNOSIS — D631 Anemia in chronic kidney disease: Secondary | ICD-10-CM | POA: Diagnosis not present

## 2016-04-21 DIAGNOSIS — N186 End stage renal disease: Secondary | ICD-10-CM | POA: Diagnosis not present

## 2016-04-21 DIAGNOSIS — N2581 Secondary hyperparathyroidism of renal origin: Secondary | ICD-10-CM | POA: Diagnosis not present

## 2016-04-23 DIAGNOSIS — D631 Anemia in chronic kidney disease: Secondary | ICD-10-CM | POA: Diagnosis not present

## 2016-04-23 DIAGNOSIS — N2581 Secondary hyperparathyroidism of renal origin: Secondary | ICD-10-CM | POA: Diagnosis not present

## 2016-04-23 DIAGNOSIS — N186 End stage renal disease: Secondary | ICD-10-CM | POA: Diagnosis not present

## 2016-04-25 DIAGNOSIS — D631 Anemia in chronic kidney disease: Secondary | ICD-10-CM | POA: Diagnosis not present

## 2016-04-25 DIAGNOSIS — N2581 Secondary hyperparathyroidism of renal origin: Secondary | ICD-10-CM | POA: Diagnosis not present

## 2016-04-25 DIAGNOSIS — N186 End stage renal disease: Secondary | ICD-10-CM | POA: Diagnosis not present

## 2016-04-28 DIAGNOSIS — N2581 Secondary hyperparathyroidism of renal origin: Secondary | ICD-10-CM | POA: Diagnosis not present

## 2016-04-28 DIAGNOSIS — D631 Anemia in chronic kidney disease: Secondary | ICD-10-CM | POA: Diagnosis not present

## 2016-04-28 DIAGNOSIS — N186 End stage renal disease: Secondary | ICD-10-CM | POA: Diagnosis not present

## 2016-04-30 DIAGNOSIS — N186 End stage renal disease: Secondary | ICD-10-CM | POA: Diagnosis not present

## 2016-04-30 DIAGNOSIS — D631 Anemia in chronic kidney disease: Secondary | ICD-10-CM | POA: Diagnosis not present

## 2016-04-30 DIAGNOSIS — N2581 Secondary hyperparathyroidism of renal origin: Secondary | ICD-10-CM | POA: Diagnosis not present

## 2016-05-02 DIAGNOSIS — N2581 Secondary hyperparathyroidism of renal origin: Secondary | ICD-10-CM | POA: Diagnosis not present

## 2016-05-02 DIAGNOSIS — D631 Anemia in chronic kidney disease: Secondary | ICD-10-CM | POA: Diagnosis not present

## 2016-05-02 DIAGNOSIS — N186 End stage renal disease: Secondary | ICD-10-CM | POA: Diagnosis not present

## 2016-05-05 DIAGNOSIS — N2581 Secondary hyperparathyroidism of renal origin: Secondary | ICD-10-CM | POA: Diagnosis not present

## 2016-05-05 DIAGNOSIS — D631 Anemia in chronic kidney disease: Secondary | ICD-10-CM | POA: Diagnosis not present

## 2016-05-05 DIAGNOSIS — N186 End stage renal disease: Secondary | ICD-10-CM | POA: Diagnosis not present

## 2016-05-07 DIAGNOSIS — D631 Anemia in chronic kidney disease: Secondary | ICD-10-CM | POA: Diagnosis not present

## 2016-05-07 DIAGNOSIS — N2581 Secondary hyperparathyroidism of renal origin: Secondary | ICD-10-CM | POA: Diagnosis not present

## 2016-05-07 DIAGNOSIS — N186 End stage renal disease: Secondary | ICD-10-CM | POA: Diagnosis not present

## 2016-05-08 DIAGNOSIS — I10 Essential (primary) hypertension: Secondary | ICD-10-CM | POA: Diagnosis not present

## 2016-05-08 DIAGNOSIS — N186 End stage renal disease: Secondary | ICD-10-CM | POA: Diagnosis not present

## 2016-05-08 DIAGNOSIS — Z992 Dependence on renal dialysis: Secondary | ICD-10-CM | POA: Diagnosis not present

## 2016-05-09 DIAGNOSIS — D631 Anemia in chronic kidney disease: Secondary | ICD-10-CM | POA: Diagnosis not present

## 2016-05-09 DIAGNOSIS — N186 End stage renal disease: Secondary | ICD-10-CM | POA: Diagnosis not present

## 2016-05-09 DIAGNOSIS — N2581 Secondary hyperparathyroidism of renal origin: Secondary | ICD-10-CM | POA: Diagnosis not present

## 2016-05-12 DIAGNOSIS — D631 Anemia in chronic kidney disease: Secondary | ICD-10-CM | POA: Diagnosis not present

## 2016-05-12 DIAGNOSIS — N186 End stage renal disease: Secondary | ICD-10-CM | POA: Diagnosis not present

## 2016-05-12 DIAGNOSIS — N2581 Secondary hyperparathyroidism of renal origin: Secondary | ICD-10-CM | POA: Diagnosis not present

## 2016-05-14 DIAGNOSIS — N2581 Secondary hyperparathyroidism of renal origin: Secondary | ICD-10-CM | POA: Diagnosis not present

## 2016-05-14 DIAGNOSIS — N186 End stage renal disease: Secondary | ICD-10-CM | POA: Diagnosis not present

## 2016-05-14 DIAGNOSIS — D631 Anemia in chronic kidney disease: Secondary | ICD-10-CM | POA: Diagnosis not present

## 2016-05-16 DIAGNOSIS — N186 End stage renal disease: Secondary | ICD-10-CM | POA: Diagnosis not present

## 2016-05-16 DIAGNOSIS — D631 Anemia in chronic kidney disease: Secondary | ICD-10-CM | POA: Diagnosis not present

## 2016-05-16 DIAGNOSIS — N2581 Secondary hyperparathyroidism of renal origin: Secondary | ICD-10-CM | POA: Diagnosis not present

## 2016-05-19 DIAGNOSIS — D631 Anemia in chronic kidney disease: Secondary | ICD-10-CM | POA: Diagnosis not present

## 2016-05-19 DIAGNOSIS — N186 End stage renal disease: Secondary | ICD-10-CM | POA: Diagnosis not present

## 2016-05-19 DIAGNOSIS — N2581 Secondary hyperparathyroidism of renal origin: Secondary | ICD-10-CM | POA: Diagnosis not present

## 2016-05-21 DIAGNOSIS — N2581 Secondary hyperparathyroidism of renal origin: Secondary | ICD-10-CM | POA: Diagnosis not present

## 2016-05-21 DIAGNOSIS — N186 End stage renal disease: Secondary | ICD-10-CM | POA: Diagnosis not present

## 2016-05-21 DIAGNOSIS — D631 Anemia in chronic kidney disease: Secondary | ICD-10-CM | POA: Diagnosis not present

## 2016-05-23 DIAGNOSIS — D631 Anemia in chronic kidney disease: Secondary | ICD-10-CM | POA: Diagnosis not present

## 2016-05-23 DIAGNOSIS — N2581 Secondary hyperparathyroidism of renal origin: Secondary | ICD-10-CM | POA: Diagnosis not present

## 2016-05-23 DIAGNOSIS — N186 End stage renal disease: Secondary | ICD-10-CM | POA: Diagnosis not present

## 2016-05-26 DIAGNOSIS — N186 End stage renal disease: Secondary | ICD-10-CM | POA: Diagnosis not present

## 2016-05-26 DIAGNOSIS — D631 Anemia in chronic kidney disease: Secondary | ICD-10-CM | POA: Diagnosis not present

## 2016-05-26 DIAGNOSIS — N2581 Secondary hyperparathyroidism of renal origin: Secondary | ICD-10-CM | POA: Diagnosis not present

## 2016-05-28 DIAGNOSIS — N2581 Secondary hyperparathyroidism of renal origin: Secondary | ICD-10-CM | POA: Diagnosis not present

## 2016-05-28 DIAGNOSIS — N186 End stage renal disease: Secondary | ICD-10-CM | POA: Diagnosis not present

## 2016-05-28 DIAGNOSIS — D631 Anemia in chronic kidney disease: Secondary | ICD-10-CM | POA: Diagnosis not present

## 2016-05-30 DIAGNOSIS — N186 End stage renal disease: Secondary | ICD-10-CM | POA: Diagnosis not present

## 2016-05-30 DIAGNOSIS — N2581 Secondary hyperparathyroidism of renal origin: Secondary | ICD-10-CM | POA: Diagnosis not present

## 2016-05-30 DIAGNOSIS — D631 Anemia in chronic kidney disease: Secondary | ICD-10-CM | POA: Diagnosis not present

## 2016-06-02 DIAGNOSIS — N186 End stage renal disease: Secondary | ICD-10-CM | POA: Diagnosis not present

## 2016-06-02 DIAGNOSIS — N2581 Secondary hyperparathyroidism of renal origin: Secondary | ICD-10-CM | POA: Diagnosis not present

## 2016-06-02 DIAGNOSIS — D631 Anemia in chronic kidney disease: Secondary | ICD-10-CM | POA: Diagnosis not present

## 2016-06-04 DIAGNOSIS — N2581 Secondary hyperparathyroidism of renal origin: Secondary | ICD-10-CM | POA: Diagnosis not present

## 2016-06-04 DIAGNOSIS — N186 End stage renal disease: Secondary | ICD-10-CM | POA: Diagnosis not present

## 2016-06-04 DIAGNOSIS — D631 Anemia in chronic kidney disease: Secondary | ICD-10-CM | POA: Diagnosis not present

## 2016-06-06 DIAGNOSIS — N186 End stage renal disease: Secondary | ICD-10-CM | POA: Diagnosis not present

## 2016-06-06 DIAGNOSIS — N2581 Secondary hyperparathyroidism of renal origin: Secondary | ICD-10-CM | POA: Diagnosis not present

## 2016-06-06 DIAGNOSIS — D631 Anemia in chronic kidney disease: Secondary | ICD-10-CM | POA: Diagnosis not present

## 2016-06-07 DIAGNOSIS — I10 Essential (primary) hypertension: Secondary | ICD-10-CM | POA: Diagnosis not present

## 2016-06-07 DIAGNOSIS — N186 End stage renal disease: Secondary | ICD-10-CM | POA: Diagnosis not present

## 2016-06-07 DIAGNOSIS — Z992 Dependence on renal dialysis: Secondary | ICD-10-CM | POA: Diagnosis not present

## 2016-06-09 DIAGNOSIS — N2581 Secondary hyperparathyroidism of renal origin: Secondary | ICD-10-CM | POA: Diagnosis not present

## 2016-06-09 DIAGNOSIS — N186 End stage renal disease: Secondary | ICD-10-CM | POA: Diagnosis not present

## 2016-06-09 DIAGNOSIS — D509 Iron deficiency anemia, unspecified: Secondary | ICD-10-CM | POA: Diagnosis not present

## 2016-06-09 DIAGNOSIS — D631 Anemia in chronic kidney disease: Secondary | ICD-10-CM | POA: Diagnosis not present

## 2016-06-11 DIAGNOSIS — D509 Iron deficiency anemia, unspecified: Secondary | ICD-10-CM | POA: Diagnosis not present

## 2016-06-11 DIAGNOSIS — D631 Anemia in chronic kidney disease: Secondary | ICD-10-CM | POA: Diagnosis not present

## 2016-06-11 DIAGNOSIS — N186 End stage renal disease: Secondary | ICD-10-CM | POA: Diagnosis not present

## 2016-06-11 DIAGNOSIS — N2581 Secondary hyperparathyroidism of renal origin: Secondary | ICD-10-CM | POA: Diagnosis not present

## 2016-06-13 DIAGNOSIS — D509 Iron deficiency anemia, unspecified: Secondary | ICD-10-CM | POA: Diagnosis not present

## 2016-06-13 DIAGNOSIS — N186 End stage renal disease: Secondary | ICD-10-CM | POA: Diagnosis not present

## 2016-06-13 DIAGNOSIS — N2581 Secondary hyperparathyroidism of renal origin: Secondary | ICD-10-CM | POA: Diagnosis not present

## 2016-06-13 DIAGNOSIS — D631 Anemia in chronic kidney disease: Secondary | ICD-10-CM | POA: Diagnosis not present

## 2016-06-16 DIAGNOSIS — N2581 Secondary hyperparathyroidism of renal origin: Secondary | ICD-10-CM | POA: Diagnosis not present

## 2016-06-16 DIAGNOSIS — D509 Iron deficiency anemia, unspecified: Secondary | ICD-10-CM | POA: Diagnosis not present

## 2016-06-16 DIAGNOSIS — N186 End stage renal disease: Secondary | ICD-10-CM | POA: Diagnosis not present

## 2016-06-16 DIAGNOSIS — D631 Anemia in chronic kidney disease: Secondary | ICD-10-CM | POA: Diagnosis not present

## 2016-06-18 DIAGNOSIS — N186 End stage renal disease: Secondary | ICD-10-CM | POA: Diagnosis not present

## 2016-06-18 DIAGNOSIS — D509 Iron deficiency anemia, unspecified: Secondary | ICD-10-CM | POA: Diagnosis not present

## 2016-06-18 DIAGNOSIS — D631 Anemia in chronic kidney disease: Secondary | ICD-10-CM | POA: Diagnosis not present

## 2016-06-18 DIAGNOSIS — N2581 Secondary hyperparathyroidism of renal origin: Secondary | ICD-10-CM | POA: Diagnosis not present

## 2016-06-21 DIAGNOSIS — N186 End stage renal disease: Secondary | ICD-10-CM | POA: Diagnosis not present

## 2016-06-21 DIAGNOSIS — D509 Iron deficiency anemia, unspecified: Secondary | ICD-10-CM | POA: Diagnosis not present

## 2016-06-21 DIAGNOSIS — N2581 Secondary hyperparathyroidism of renal origin: Secondary | ICD-10-CM | POA: Diagnosis not present

## 2016-06-21 DIAGNOSIS — D631 Anemia in chronic kidney disease: Secondary | ICD-10-CM | POA: Diagnosis not present

## 2016-06-22 ENCOUNTER — Ambulatory Visit (HOSPITAL_COMMUNITY)
Admission: EM | Admit: 2016-06-22 | Discharge: 2016-06-22 | Disposition: A | Discharge status: Home / Self Care | Payer: Medicare Other | Attending: Internal Medicine | Admitting: Internal Medicine

## 2016-06-22 DIAGNOSIS — B351 Tinea unguium: Secondary | ICD-10-CM | POA: Diagnosis not present

## 2016-06-22 DIAGNOSIS — S99922A Unspecified injury of left foot, initial encounter: Secondary | ICD-10-CM | POA: Diagnosis not present

## 2016-06-22 MED ORDER — CEPHALEXIN 250 MG PO CAPS: 250.0000 mg | ORAL_CAPSULE | Freq: Two times a day (BID) | ORAL | 0 refills | Status: AC

## 2016-06-22 NOTE — ED Provider Notes (Signed)
CSN: [id number]     Arrival date & time 06/22/16  1220 History   First MD Initiated Contact with Patient 06/22/16 1300     Chief Complaint  Patient presents with  . Toe Injury   (Consider location/radiation/quality/duration/timing/severity/associated sxs/prior Treatment) Pt has chronic toe nail and feet problems. States that he has been podiarty in the past but not recent. Purchased new shoes that was tight on his feet while out of town this weekend. Came home and took socks off and has LT big toe nail and RT 3rd toe nail bleeding and partially lifted off nail bed. Bleeding controlled and pt would like for [place] to remove the nail bed. Pt states that he has tried cutting the nail with not relief. Only has pain if he tries to pull on toes. Noted fungal through out all digits and to nail beds. Pt denies having any DM.       Past Medical History:  Diagnosis Date  . Acute on chronic systolic right heart failure (HCC) 11/14/2015  . Anemia   . Atrial fibrillation (HCC)    not on coumadin due to large spontaneous hematoma on back and anemia  . Atrial flutter (HCC) 04/27/2013  . AVF (arteriovenous fistula) (HCC)    Left  . CHF (congestive heart failure) (HCC)    EF 20-25%  . Coronary artery disease   . Diverticulosis   . Dysrhythmia    afib  . Elevated troponin 11/14/2015  . ESRD (end stage renal disease) on dialysis (HCC)    [facility] mon/wed/fri  . Exertional shortness of breath    "related to infection in my lungs right now" (05/03/2013)  . GERD (gastroesophageal reflux disease)   . Hepatosplenomegaly 10/18/2015   Workup per Dr Kaplan/ GI in Dec 2015 > abd pain improved after paracentesis x 2 (1.2L, 1.3L). Had negative hepA/ hep B/ hep C testing. For hepatosplenomagealy pt underwent transjugular liver biopsy by IR with hepatic vein wedge pressure measurement which showed elevated right heart and free hepatic venous pressures likely due to right heart failure.  There was no evidence of portal  hypertension. Liver bx originally was reported as "End stage liver disease/ cirrhosis", then was corrected > "No evidence of cirrhosis".  The corrected liver biopsy result reads "SINUSOIDAL DILATATION WITH SCATTERED FOCI OF HEPATITIS".  Last GI visit was Aug 2016 for bloating/ abd pain, noted he had a normal gastric empty study.  -Right lobe liver lesion. Felt to be hemangioma, Biopsy pending.  -thrombocytopenia. Dates back to 08/2013.  -ESRD. MWF HD.  -biventricular heart failure, acute right sided heart failure. Daily HD to address volume overload.    Marland Kitchen Hereditary and idiopathic peripheral neuropathy 08/29/2015  . History of gout    "before I started doing the dialysis" (05/03/2013)  . Hypertension   . Hypertensive urgency    H/o  . Hypovitaminosis D   . Myocardial infarction 90's  . Peripheral vascular disease (HCC)    dvt leg 12/14  . Pulmonary embolism (HCC)    with right DVT secondary to recent surgery  . Rectal bleeding 11/2015  . Renal insufficiency   . S/P repair of ventral hernia 03/21/2014  . Secondary hyperparathyroidism (HCC)   . Sleep apnea   . Syncope    felt secondary to residual anesthesia the day before - 2D echo unremarkable   Past Surgical History:  Procedure Laterality Date  . AV FISTULA PLACEMENT Left    Dr. Charlean Sanfilippo; "I've had 2 on the left' (05/03/2013)  .  AV FISTULA PLACEMENT Right ~ 2011  . AVGG REMOVAL Right 05/04/2013   Procedure: REMOVAL OF ARTERIOVENOUS Fistula Right Arm;  Surgeon: [name], MD;  Location: [facility] OR;  Service: Vascular;  Laterality: Right;  . BASCILIC VEIN TRANSPOSITION Left 06/27/2013   Procedure: BASCILIC VEIN TRANSPOSITION;  Surgeon: [name], MD;  Location: [facility] OR;  Service: Vascular;  Laterality: Left;  . CARDIAC CATHETERIZATION N/A 11/15/2015   Procedure: Right Heart Cath;  Surgeon: Lennette Bihari, MD;  Location: [facility] INVASIVE CV LAB;  Service: Cardiovascular;  Laterality: N/A;  . CARDIAC CATHETERIZATION Right 11/15/2015   Procedure:  Left Heart Cath;  Surgeon: Lennette Bihari, MD;  Location: [facility] INVASIVE CV LAB;  Service: Cardiovascular;  Laterality: Right;  . CARDIOVERSION N/A 08/29/2013   Procedure: CARDIOVERSION;  Surgeon: [name], MD;  Location: [facility] ENDOSCOPY;  Service: Cardiovascular;  Laterality: N/A;  . COLONOSCOPY  10-29-2005   Hx: of  . COLONOSCOPY N/A 11/30/2015   Procedure: COLONOSCOPY;  Surgeon: Meryl Dare, MD;  Location: [facility] ENDOSCOPY;  Service: Endoscopy;  Laterality: N/A;  . ESOPHAGOGASTRODUODENOSCOPY N/A 11/30/2015   Procedure: ESOPHAGOGASTRODUODENOSCOPY (EGD);  Surgeon: Meryl Dare, MD;  Location: [facility] ENDOSCOPY;  Service: Endoscopy;  Laterality: N/A;  . HERNIA REPAIR  03/21/14   Umbilical hernia-Dr. Derrell Lolling  . INSERTION OF DIALYSIS CATHETER Right 05/04/2013   Procedure: INSERTION OF DIALYSIS CATHETER;  Surgeon: [name], MD;  Location: [facility] OR;  Service: Vascular;  Laterality: Right;  . INSERTION OF MESH N/A 03/21/2014   Procedure: INSERTION OF MESH;  Surgeon: Axel Filler, MD;  Location: [facility] OR;  Service: General;  Laterality: N/A;  . KNEE ARTHROSCOPY Left   . REMOVAL OF A DIALYSIS CATHETER  2/15  . UMBILICAL HERNIA REPAIR N/A 03/21/2014   Procedure: LAPAROSCOPIC UMBILICAL HERNIA REPAIR WITH MESH;  Surgeon: Axel Filler, MD;  Location: [facility] OR;  Service: General;  Laterality: N/A;  . VENOGRAM Left 05/26/2013   Procedure: VENOGRAM;  Surgeon: Fransisco Hertz, MD;  Location: [facility] CATH LAB;  Service: Cardiovascular;  Laterality: Left;   Family History  Problem Relation Age of Onset  . Hypertension Father   . Kidney disease Father   . Allergies Father   . Deep vein thrombosis Sister   . Pulmonary embolism Sister   . Diabetes Paternal Grandmother    Social History  Substance Use Topics  . Smoking status: Former Smoker    Packs/day: 0.25    Years: 0.50    Types: Cigarettes    Quit date: 09/08/1972  . Smokeless tobacco: Never Used  . Alcohol use No     Comment: 05/03/2013 "haven't had a glass  of wine in ~ 3 months or so; sometimes will have one w/dinner"    Review of Systems  Constitutional: Negative.   Respiratory: Negative.   Cardiovascular: Negative.   Skin:       Bleeding minimal to LT big toe and to RT 3rd toe near nail bed.     Allergies  Pork-derived products  Home Medications   Prior to Admission medications   Medication Sig Start Date End Date Taking? Authorizing Provider  albuterol (PROVENTIL HFA;VENTOLIN HFA) 108 (90 Base) MCG/ACT inhaler Inhale 2 puffs into the lungs every 4 (four) hours as needed for wheezing or shortness of breath.    Historical Provider, MD  aspirin EC 325 MG EC tablet Take 1 tablet (325 mg total) by mouth daily. 03/26/16   Richarda Overlie, MD  B Complex-C (B-COMPLEX WITH VITAMIN C)  tablet Take 1 tablet by mouth daily.    Historical Provider, MD  cephALEXin (KEFLEX) 250 MG capsule Take 1 capsule (250 mg total) by mouth 2 (two) times daily. 06/22/16   Tobi BastosMelanie A Keyonda Bickle, NP  fluticasone (FLONASE) 50 MCG/ACT nasal spray 1-2 Sprays per nostril daily as needed. 04/15/16   Cristal Fordalph Carter Bobbitt, MD  hydrOXYzine (ATARAX/VISTARIL) 10 MG tablet Take 1 tablet (10 mg total) by mouth every 6 (six) hours as needed for itching. 03/26/16   Richarda OverlieNayana Abrol, MD  labetalol (NORMODYNE) 200 MG tablet Take 200 mg by mouth 2 (two) times daily. 12/24/15   Historical Provider, MD  montelukast (SINGULAIR) 10 MG tablet Take 10 mg by mouth every evening. 03/13/16   Historical Provider, MD  nitroGLYCERIN (NITROSTAT) 0.4 MG SL tablet Place 1 tablet (0.4 mg total) under the tongue every 5 (five) minutes as needed for chest pain. 11/17/15   Rodolph Bonganiel V Thompson, MD  Nutritional Supplements (FEEDING SUPPLEMENT, NEPRO CARB STEADY,) LIQD Take 237 mLs by mouth 2 (two) times daily between meals. 03/26/16   Richarda OverlieNayana Abrol, MD  oxyCODONE-acetaminophen (PERCOCET/ROXICET) 5-325 MG tablet Take 1 tablet by mouth every 6 (six) hours as needed. Pain 10/29/15   Historical Provider, MD  RENVELA 800 MG tablet   03/20/16   Historical Provider, MD   Meds Ordered and Administered this Visit  Medications - No data to display  BP 148/78 (BP Location: Right Arm)   Pulse 78   Temp 98.6 F (37 C) (Oral)   Resp 18   SpO2 99%  No data found.   Physical Exam  Constitutional: He appears well-developed.  Cardiovascular: Normal rate and regular rhythm.   Pulmonary/Chest: Effort normal and breath sounds normal.  Musculoskeletal: Normal range of motion.  Skin: Capillary refill takes less than 2 seconds. There is erythema.  Fungal around all digits and under all nail beds to feet. LT big toe has fungus that is lifting nail bed up causing bleeding under corner of nail bed. RT 3rd toe has a small amount of blood near cuticle line. Neuro intact, skin warm and pink. Bleeding controlled     Urgent Care Course   Clinical Course    Procedures (including critical care time)  Labs Review Labs Reviewed - No data to display  Imaging Review No results found.                MDM   1. Fungal infection of toenail   2. Injury of toe on left foot, initial encounter    You will need to see podiatry for feet. You have a fungal infection to multiple toes and it has caused a lifting of the the toe nail bed. This is causing bleeding to the area. Wash dry area. Apply a small amount of neosporin to area and wrap. Avoid tight shoes. You have some redness to area and will be placed on an antibiotic to cover for infection.  Education given to not pick at areas, do not try to pull up the nail bed.     Tobi BastosMelanie A Azalie Harbeck, NP 06/22/16 [phone]

## 2016-06-22 NOTE — ED Triage Notes (Signed)
Pt  Is  esrd       dialysed     yest pt  Has  A    Loose  l big  tenail  That  He  Reports  Is  Partially off       He reports  r  Third  Toe  Has   oproblems  As   Well   He  denys  Any  specefic  Injury  However  Has  Been  Out of  Town    And   Has been  Wearing    New  Shoes

## 2016-06-22 NOTE — Discharge Instructions (Signed)
You will need to see podiatry for feet. You have a fungal infection to multiple toes and it has caused a lifting of the the toe nail bed. This is causing bleeding to the area. Wash dry area. Apply a small amount of neosporin to area and wrap. Avoid tight shoes. You have some redness to area and will be placed on an antibiotic to cover for infection.

## 2016-06-23 DIAGNOSIS — N2581 Secondary hyperparathyroidism of renal origin: Secondary | ICD-10-CM | POA: Diagnosis not present

## 2016-06-23 DIAGNOSIS — D631 Anemia in chronic kidney disease: Secondary | ICD-10-CM | POA: Diagnosis not present

## 2016-06-23 DIAGNOSIS — N186 End stage renal disease: Secondary | ICD-10-CM | POA: Diagnosis not present

## 2016-06-23 DIAGNOSIS — D509 Iron deficiency anemia, unspecified: Secondary | ICD-10-CM | POA: Diagnosis not present

## 2016-06-25 DIAGNOSIS — D509 Iron deficiency anemia, unspecified: Secondary | ICD-10-CM | POA: Diagnosis not present

## 2016-06-25 DIAGNOSIS — D631 Anemia in chronic kidney disease: Secondary | ICD-10-CM | POA: Diagnosis not present

## 2016-06-25 DIAGNOSIS — N2581 Secondary hyperparathyroidism of renal origin: Secondary | ICD-10-CM | POA: Diagnosis not present

## 2016-06-25 DIAGNOSIS — N186 End stage renal disease: Secondary | ICD-10-CM | POA: Diagnosis not present

## 2016-06-27 DIAGNOSIS — N2581 Secondary hyperparathyroidism of renal origin: Secondary | ICD-10-CM | POA: Diagnosis not present

## 2016-06-27 DIAGNOSIS — N186 End stage renal disease: Secondary | ICD-10-CM | POA: Diagnosis not present

## 2016-06-27 DIAGNOSIS — D509 Iron deficiency anemia, unspecified: Secondary | ICD-10-CM | POA: Diagnosis not present

## 2016-06-27 DIAGNOSIS — D631 Anemia in chronic kidney disease: Secondary | ICD-10-CM | POA: Diagnosis not present

## 2016-06-30 DIAGNOSIS — N186 End stage renal disease: Secondary | ICD-10-CM | POA: Diagnosis not present

## 2016-06-30 DIAGNOSIS — N2581 Secondary hyperparathyroidism of renal origin: Secondary | ICD-10-CM | POA: Diagnosis not present

## 2016-06-30 DIAGNOSIS — D631 Anemia in chronic kidney disease: Secondary | ICD-10-CM | POA: Diagnosis not present

## 2016-06-30 DIAGNOSIS — L609 Nail disorder, unspecified: Secondary | ICD-10-CM | POA: Diagnosis not present

## 2016-06-30 DIAGNOSIS — D509 Iron deficiency anemia, unspecified: Secondary | ICD-10-CM | POA: Diagnosis not present

## 2016-07-02 DIAGNOSIS — D509 Iron deficiency anemia, unspecified: Secondary | ICD-10-CM | POA: Diagnosis not present

## 2016-07-02 DIAGNOSIS — N186 End stage renal disease: Secondary | ICD-10-CM | POA: Diagnosis not present

## 2016-07-02 DIAGNOSIS — N2581 Secondary hyperparathyroidism of renal origin: Secondary | ICD-10-CM | POA: Diagnosis not present

## 2016-07-02 DIAGNOSIS — D631 Anemia in chronic kidney disease: Secondary | ICD-10-CM | POA: Diagnosis not present

## 2016-07-04 DIAGNOSIS — D509 Iron deficiency anemia, unspecified: Secondary | ICD-10-CM | POA: Diagnosis not present

## 2016-07-04 DIAGNOSIS — D631 Anemia in chronic kidney disease: Secondary | ICD-10-CM | POA: Diagnosis not present

## 2016-07-04 DIAGNOSIS — N186 End stage renal disease: Secondary | ICD-10-CM | POA: Diagnosis not present

## 2016-07-04 DIAGNOSIS — N2581 Secondary hyperparathyroidism of renal origin: Secondary | ICD-10-CM | POA: Diagnosis not present

## 2016-07-08 DIAGNOSIS — Z992 Dependence on renal dialysis: Secondary | ICD-10-CM | POA: Diagnosis not present

## 2016-07-08 DIAGNOSIS — D631 Anemia in chronic kidney disease: Secondary | ICD-10-CM | POA: Diagnosis not present

## 2016-07-08 DIAGNOSIS — N186 End stage renal disease: Secondary | ICD-10-CM | POA: Diagnosis not present

## 2016-07-08 DIAGNOSIS — N2581 Secondary hyperparathyroidism of renal origin: Secondary | ICD-10-CM | POA: Diagnosis not present

## 2016-07-08 DIAGNOSIS — D509 Iron deficiency anemia, unspecified: Secondary | ICD-10-CM | POA: Diagnosis not present

## 2016-07-08 DIAGNOSIS — I10 Essential (primary) hypertension: Secondary | ICD-10-CM | POA: Diagnosis not present

## 2016-07-09 DIAGNOSIS — L03031 Cellulitis of right toe: Secondary | ICD-10-CM | POA: Diagnosis not present

## 2016-07-09 DIAGNOSIS — N2581 Secondary hyperparathyroidism of renal origin: Secondary | ICD-10-CM | POA: Diagnosis not present

## 2016-07-09 DIAGNOSIS — N186 End stage renal disease: Secondary | ICD-10-CM | POA: Diagnosis not present

## 2016-07-09 DIAGNOSIS — G64 Other disorders of peripheral nervous system: Secondary | ICD-10-CM | POA: Diagnosis not present

## 2016-07-09 DIAGNOSIS — D631 Anemia in chronic kidney disease: Secondary | ICD-10-CM | POA: Diagnosis not present

## 2016-07-09 DIAGNOSIS — I739 Peripheral vascular disease, unspecified: Secondary | ICD-10-CM | POA: Diagnosis not present

## 2016-07-11 DIAGNOSIS — D631 Anemia in chronic kidney disease: Secondary | ICD-10-CM | POA: Diagnosis not present

## 2016-07-11 DIAGNOSIS — N186 End stage renal disease: Secondary | ICD-10-CM | POA: Diagnosis not present

## 2016-07-11 DIAGNOSIS — N2581 Secondary hyperparathyroidism of renal origin: Secondary | ICD-10-CM | POA: Diagnosis not present

## 2016-07-14 DIAGNOSIS — N2581 Secondary hyperparathyroidism of renal origin: Secondary | ICD-10-CM | POA: Diagnosis not present

## 2016-07-14 DIAGNOSIS — N186 End stage renal disease: Secondary | ICD-10-CM | POA: Diagnosis not present

## 2016-07-14 DIAGNOSIS — D631 Anemia in chronic kidney disease: Secondary | ICD-10-CM | POA: Diagnosis not present

## 2016-07-16 DIAGNOSIS — D631 Anemia in chronic kidney disease: Secondary | ICD-10-CM | POA: Diagnosis not present

## 2016-07-16 DIAGNOSIS — N2581 Secondary hyperparathyroidism of renal origin: Secondary | ICD-10-CM | POA: Diagnosis not present

## 2016-07-16 DIAGNOSIS — N186 End stage renal disease: Secondary | ICD-10-CM | POA: Diagnosis not present

## 2016-07-18 DIAGNOSIS — N2581 Secondary hyperparathyroidism of renal origin: Secondary | ICD-10-CM | POA: Diagnosis not present

## 2016-07-18 DIAGNOSIS — D631 Anemia in chronic kidney disease: Secondary | ICD-10-CM | POA: Diagnosis not present

## 2016-07-18 DIAGNOSIS — N186 End stage renal disease: Secondary | ICD-10-CM | POA: Diagnosis not present

## 2016-07-21 DIAGNOSIS — N2581 Secondary hyperparathyroidism of renal origin: Secondary | ICD-10-CM | POA: Diagnosis not present

## 2016-07-21 DIAGNOSIS — D631 Anemia in chronic kidney disease: Secondary | ICD-10-CM | POA: Diagnosis not present

## 2016-07-21 DIAGNOSIS — N186 End stage renal disease: Secondary | ICD-10-CM | POA: Diagnosis not present

## 2016-07-23 DIAGNOSIS — N186 End stage renal disease: Secondary | ICD-10-CM | POA: Diagnosis not present

## 2016-07-23 DIAGNOSIS — L03031 Cellulitis of right toe: Secondary | ICD-10-CM | POA: Diagnosis not present

## 2016-07-23 DIAGNOSIS — D631 Anemia in chronic kidney disease: Secondary | ICD-10-CM | POA: Diagnosis not present

## 2016-07-23 DIAGNOSIS — N2581 Secondary hyperparathyroidism of renal origin: Secondary | ICD-10-CM | POA: Diagnosis not present

## 2016-07-23 DIAGNOSIS — I739 Peripheral vascular disease, unspecified: Secondary | ICD-10-CM | POA: Diagnosis not present

## 2016-07-23 DIAGNOSIS — G64 Other disorders of peripheral nervous system: Secondary | ICD-10-CM | POA: Diagnosis not present

## 2016-07-25 ENCOUNTER — Ambulatory Visit (HOSPITAL_COMMUNITY)
Admission: RE | Admit: 2016-07-25 | Discharge: 2016-07-25 | Disposition: A | Discharge status: Home / Self Care | Payer: Medicare Other | Source: Ambulatory Visit | Attending: Cardiovascular Disease | Admitting: Cardiovascular Disease

## 2016-07-25 ENCOUNTER — Other Ambulatory Visit (HOSPITAL_COMMUNITY): Payer: Self-pay | Admitting: Foot & Ankle Surgery

## 2016-07-25 DIAGNOSIS — I739 Peripheral vascular disease, unspecified: Secondary | ICD-10-CM

## 2016-07-25 DIAGNOSIS — E785 Hyperlipidemia, unspecified: Secondary | ICD-10-CM | POA: Insufficient documentation

## 2016-07-25 DIAGNOSIS — Z87891 Personal history of nicotine dependence: Secondary | ICD-10-CM | POA: Diagnosis not present

## 2016-07-25 DIAGNOSIS — D631 Anemia in chronic kidney disease: Secondary | ICD-10-CM | POA: Diagnosis not present

## 2016-07-25 DIAGNOSIS — N186 End stage renal disease: Secondary | ICD-10-CM | POA: Diagnosis not present

## 2016-07-25 DIAGNOSIS — I10 Essential (primary) hypertension: Secondary | ICD-10-CM | POA: Insufficient documentation

## 2016-07-25 DIAGNOSIS — R0989 Other specified symptoms and signs involving the circulatory and respiratory systems: Secondary | ICD-10-CM | POA: Diagnosis not present

## 2016-07-25 DIAGNOSIS — N2581 Secondary hyperparathyroidism of renal origin: Secondary | ICD-10-CM | POA: Diagnosis not present

## 2016-07-27 DIAGNOSIS — N186 End stage renal disease: Secondary | ICD-10-CM | POA: Diagnosis not present

## 2016-07-27 DIAGNOSIS — N2581 Secondary hyperparathyroidism of renal origin: Secondary | ICD-10-CM | POA: Diagnosis not present

## 2016-07-27 DIAGNOSIS — D631 Anemia in chronic kidney disease: Secondary | ICD-10-CM | POA: Diagnosis not present

## 2016-07-29 DIAGNOSIS — N2581 Secondary hyperparathyroidism of renal origin: Secondary | ICD-10-CM | POA: Diagnosis not present

## 2016-07-29 DIAGNOSIS — D631 Anemia in chronic kidney disease: Secondary | ICD-10-CM | POA: Diagnosis not present

## 2016-07-29 DIAGNOSIS — N186 End stage renal disease: Secondary | ICD-10-CM | POA: Diagnosis not present

## 2016-08-01 DIAGNOSIS — N2581 Secondary hyperparathyroidism of renal origin: Secondary | ICD-10-CM | POA: Diagnosis not present

## 2016-08-01 DIAGNOSIS — D631 Anemia in chronic kidney disease: Secondary | ICD-10-CM | POA: Diagnosis not present

## 2016-08-01 DIAGNOSIS — N186 End stage renal disease: Secondary | ICD-10-CM | POA: Diagnosis not present

## 2016-08-04 DIAGNOSIS — G64 Other disorders of peripheral nervous system: Secondary | ICD-10-CM | POA: Diagnosis not present

## 2016-08-04 DIAGNOSIS — D631 Anemia in chronic kidney disease: Secondary | ICD-10-CM | POA: Diagnosis not present

## 2016-08-04 DIAGNOSIS — I739 Peripheral vascular disease, unspecified: Secondary | ICD-10-CM | POA: Diagnosis not present

## 2016-08-04 DIAGNOSIS — N186 End stage renal disease: Secondary | ICD-10-CM | POA: Diagnosis not present

## 2016-08-04 DIAGNOSIS — L03031 Cellulitis of right toe: Secondary | ICD-10-CM | POA: Diagnosis not present

## 2016-08-04 DIAGNOSIS — N2581 Secondary hyperparathyroidism of renal origin: Secondary | ICD-10-CM | POA: Diagnosis not present

## 2016-08-06 DIAGNOSIS — D631 Anemia in chronic kidney disease: Secondary | ICD-10-CM | POA: Diagnosis not present

## 2016-08-06 DIAGNOSIS — N2581 Secondary hyperparathyroidism of renal origin: Secondary | ICD-10-CM | POA: Diagnosis not present

## 2016-08-06 DIAGNOSIS — N186 End stage renal disease: Secondary | ICD-10-CM | POA: Diagnosis not present

## 2016-08-07 DIAGNOSIS — I10 Essential (primary) hypertension: Secondary | ICD-10-CM | POA: Diagnosis not present

## 2016-08-07 DIAGNOSIS — N186 End stage renal disease: Secondary | ICD-10-CM | POA: Diagnosis not present

## 2016-08-07 DIAGNOSIS — Z992 Dependence on renal dialysis: Secondary | ICD-10-CM | POA: Diagnosis not present

## 2016-08-09 DIAGNOSIS — N2581 Secondary hyperparathyroidism of renal origin: Secondary | ICD-10-CM | POA: Diagnosis not present

## 2016-08-09 DIAGNOSIS — D631 Anemia in chronic kidney disease: Secondary | ICD-10-CM | POA: Diagnosis not present

## 2016-08-09 DIAGNOSIS — N186 End stage renal disease: Secondary | ICD-10-CM | POA: Diagnosis not present

## 2016-08-10 IMAGING — CT CT ANGIO CHEST
2 of 7 series · 18 of 36 positions shown · IV contrast (Omni 300)
Comparison: Chest radiograph 1 day prior.  Chest CT 09/05/2014

CLINICAL DATA: Pleuritic chest pain. History of pulmonary embolus
and DVT.

EXAM:
CT ANGIOGRAPHY CHEST WITH CONTRAST
TECHNIQUE: Multidetector CT imaging of the chest was performed using the
standard protocol during bolus administration of intravenous
contrast. Multiplanar CT image reconstructions and MIPs were
obtained to evaluate the vascular anatomy.
CONTRAST:  80mL OMNIPAQUE IOHEXOL 350 MG/ML SOLN

[Series 6: pe thins · axial · 0.77mm/px · z∈[+1137,+1439]mm · 17 of 483 slices shown]
[im 26/483  lung]
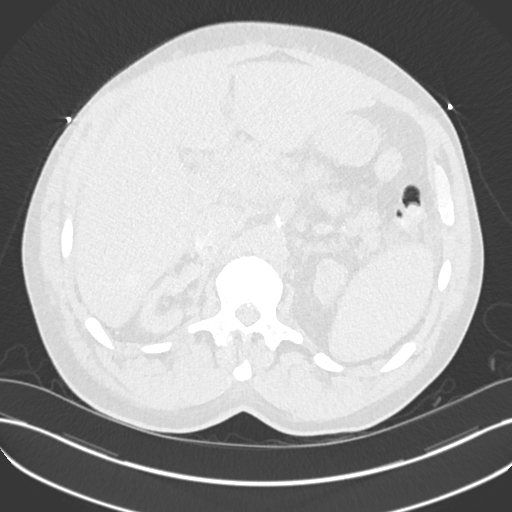
[im 51/483  mediastinal]
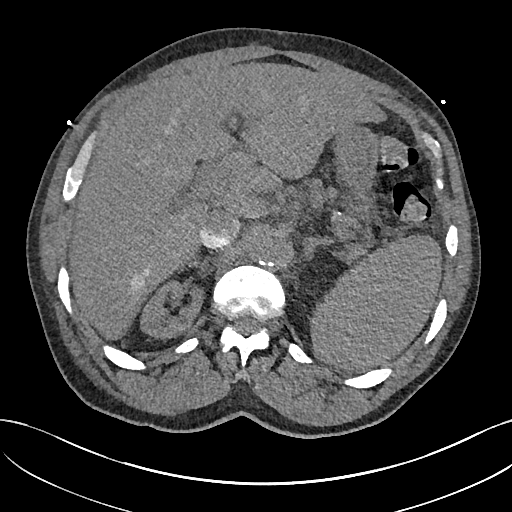
[im 77/483  lung]
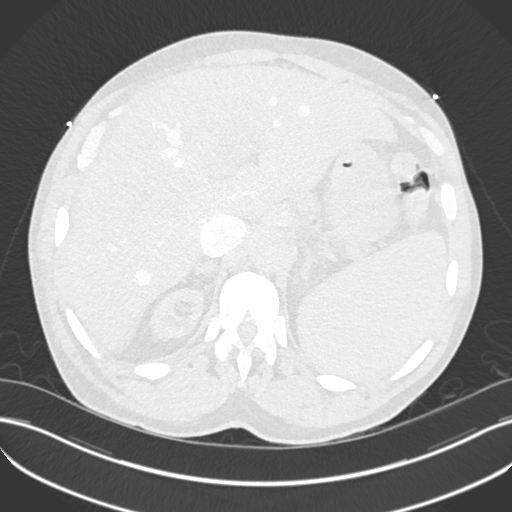
[im 102/483  mediastinal]
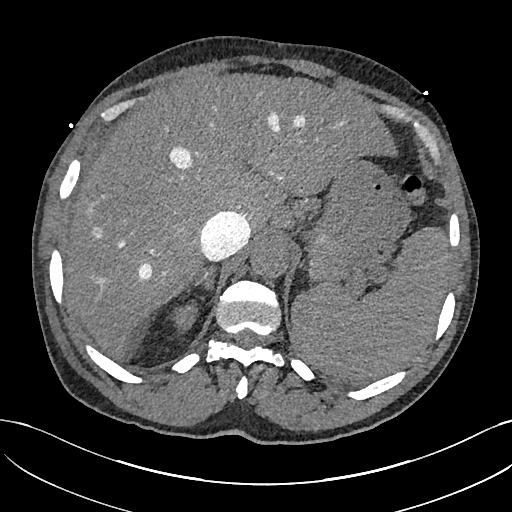
[im 127/483  lung]
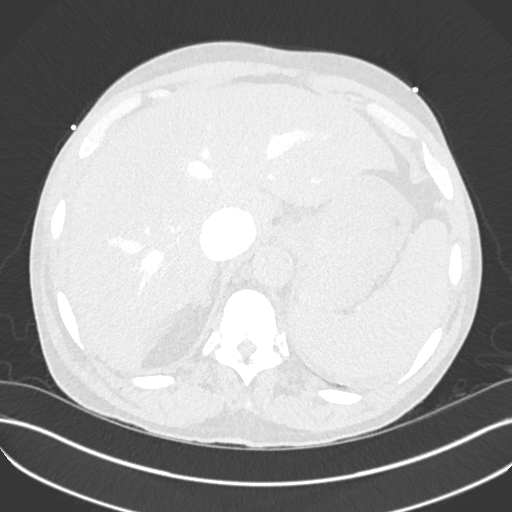
[im 153/483  mediastinal]
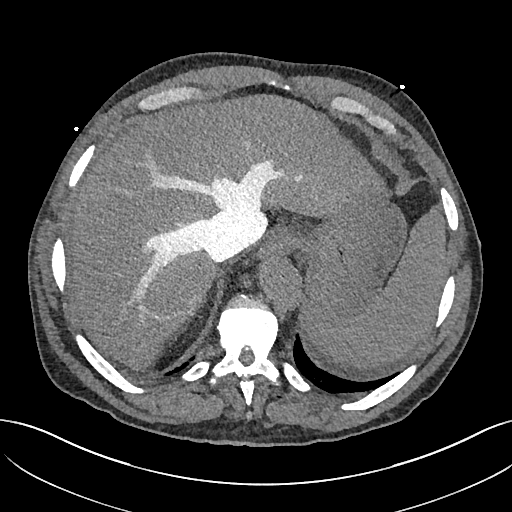
[im 178/483  lung]
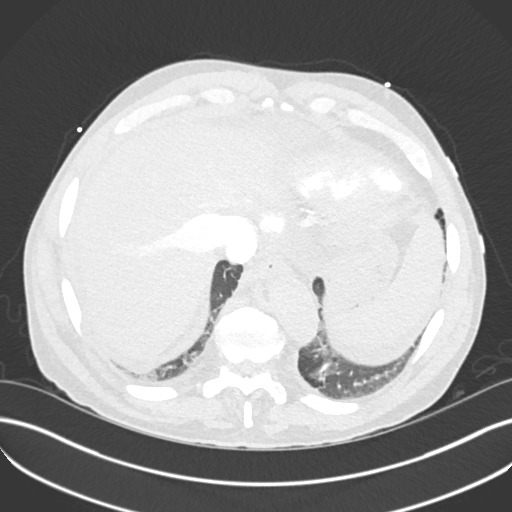
[im 203/483  mediastinal]
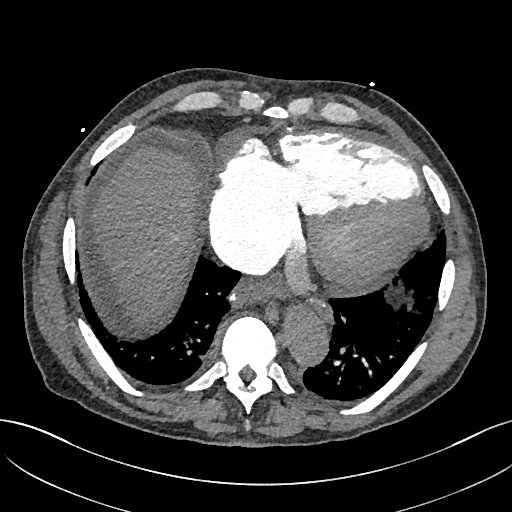
[im 254/483  lung]
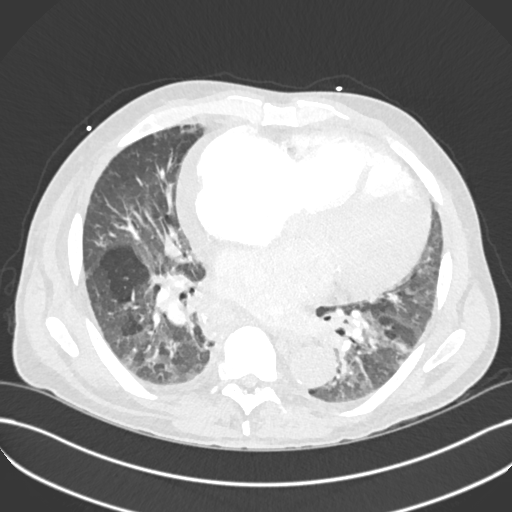
[im 280/483  mediastinal]
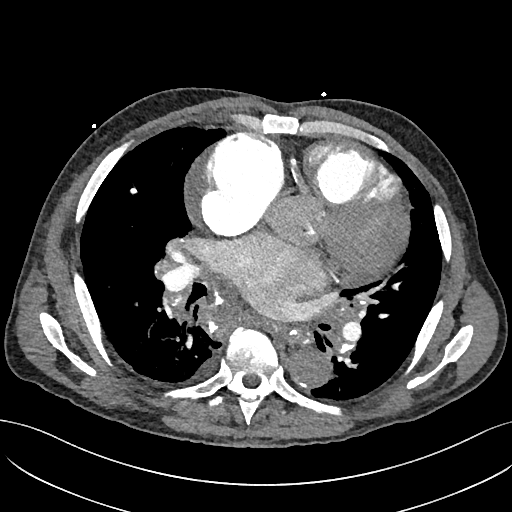
[im 305/483  lung]
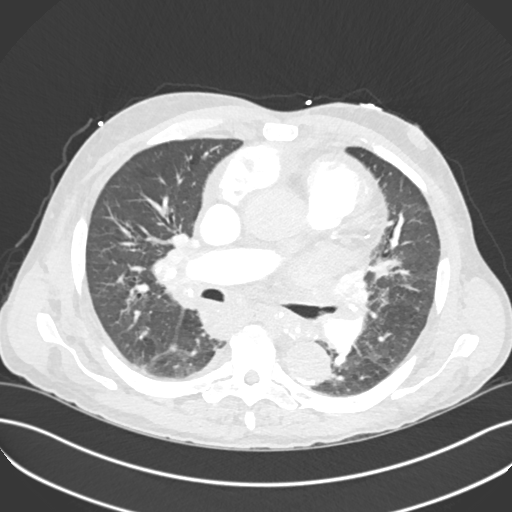
[im 330/483  mediastinal]
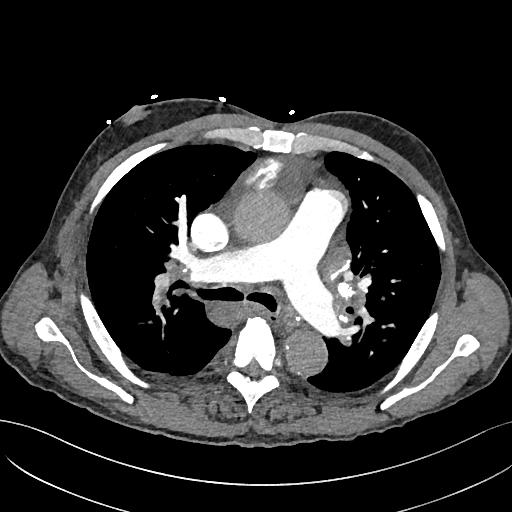
[im 356/483  lung]
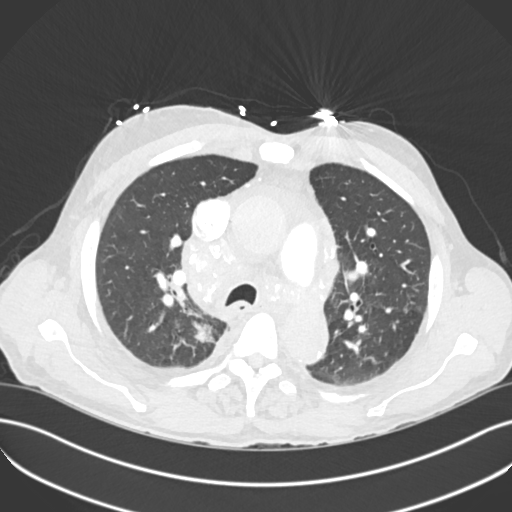
[im 381/483  mediastinal]
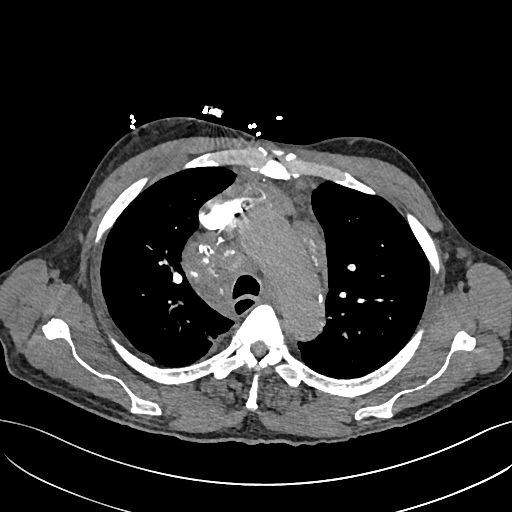
[im 406/483  lung]
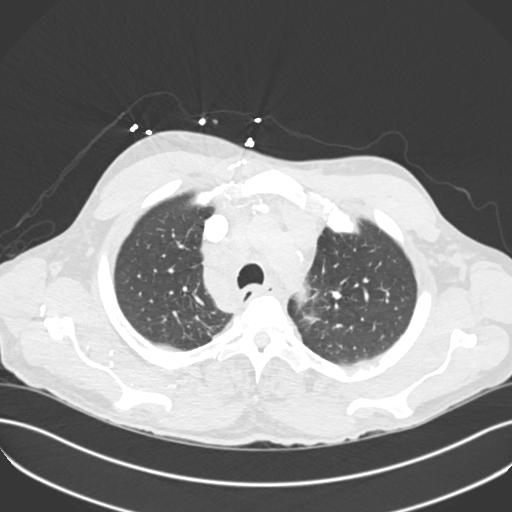
[im 432/483  mediastinal]
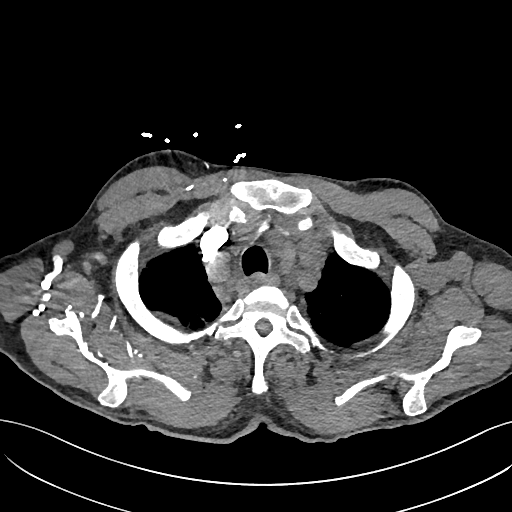
[im 457/483  lung]
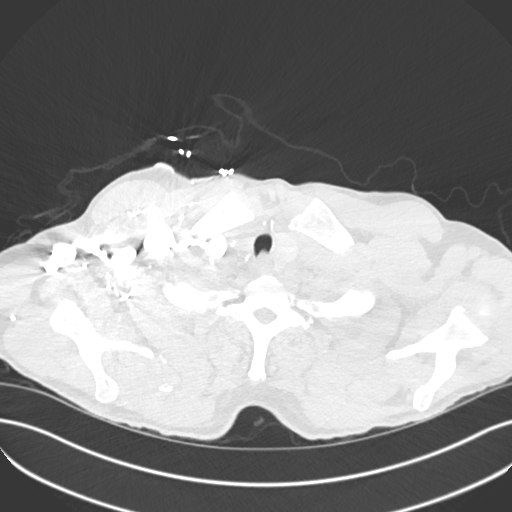

[Series 7: pe 2mm cor · coronal · 0.66mm/px · 1 of 104 slices shown]
[im 52/104  mediastinal]
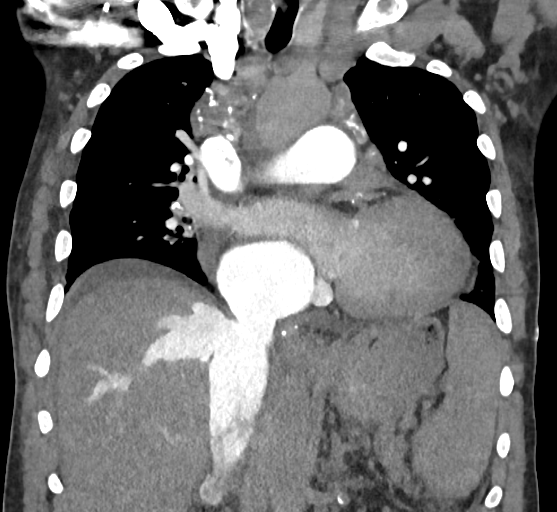

[18 of 36 positions shown; findings below may reference images not displayed]

FINDINGS: Resolution of previous filling defect in right middle lobe pulmonary
artery. No new filling defects or acute pulmonary embolus.

Multi chamber cardiomegaly, with prominent right atrial dilatation
and significant contrast refluxing into the hepatic veins and IVC.
Small pericardial effusion. Thoracic aorta with scattered
atherosclerosis. No aneurysm. Coronary artery calcifications are
seen. Extensive calcified mediastinal and bilateral hilar
adenopathy.

Calcified granuloma in the right middle lobe. Heterogeneous lower
lobe ground-glass opacities and bronchial thickening. A chronic
triangular opacity in the posterior medial right upper lobe adjacent
to right pleural thickening/minimal effusion, most consistent with
chronic atelectasis.

Evaluation of the upper abdomen demonstrates hepatosplenomegaly and
small volume perihepatic ascites. Atrophic kidneys.

There are no acute or suspicious osseous abnormalities. Increased
bone mineral density.

Review of the MIP images confirms the above findings.
IMPRESSION: 1. No pulmonary embolus.
2. Prominent right heart distension with contrast refluxing into the
hepatic veins and IVC consistent with right heart failure. This
appears chronic but advanced in degree.
3. Small pericardial effusion.
4. Multifocal calcified mediastinal adenopathy, sequela of
granulomatous disease versus sarcoidosis.
5. Chronic hepatosplenomegaly. Small volume intra-abdominal ascites.

## 2016-08-11 DIAGNOSIS — D631 Anemia in chronic kidney disease: Secondary | ICD-10-CM | POA: Diagnosis not present

## 2016-08-11 DIAGNOSIS — N2581 Secondary hyperparathyroidism of renal origin: Secondary | ICD-10-CM | POA: Diagnosis not present

## 2016-08-11 DIAGNOSIS — N186 End stage renal disease: Secondary | ICD-10-CM | POA: Diagnosis not present

## 2016-08-13 DIAGNOSIS — N186 End stage renal disease: Secondary | ICD-10-CM | POA: Diagnosis not present

## 2016-08-13 DIAGNOSIS — N2581 Secondary hyperparathyroidism of renal origin: Secondary | ICD-10-CM | POA: Diagnosis not present

## 2016-08-13 DIAGNOSIS — D631 Anemia in chronic kidney disease: Secondary | ICD-10-CM | POA: Diagnosis not present

## 2016-08-15 DIAGNOSIS — N2581 Secondary hyperparathyroidism of renal origin: Secondary | ICD-10-CM | POA: Diagnosis not present

## 2016-08-15 DIAGNOSIS — D631 Anemia in chronic kidney disease: Secondary | ICD-10-CM | POA: Diagnosis not present

## 2016-08-15 DIAGNOSIS — N186 End stage renal disease: Secondary | ICD-10-CM | POA: Diagnosis not present

## 2016-08-18 DIAGNOSIS — D631 Anemia in chronic kidney disease: Secondary | ICD-10-CM | POA: Diagnosis not present

## 2016-08-18 DIAGNOSIS — N2581 Secondary hyperparathyroidism of renal origin: Secondary | ICD-10-CM | POA: Diagnosis not present

## 2016-08-18 DIAGNOSIS — N186 End stage renal disease: Secondary | ICD-10-CM | POA: Diagnosis not present

## 2016-08-20 DIAGNOSIS — N2581 Secondary hyperparathyroidism of renal origin: Secondary | ICD-10-CM | POA: Diagnosis not present

## 2016-08-20 DIAGNOSIS — D631 Anemia in chronic kidney disease: Secondary | ICD-10-CM | POA: Diagnosis not present

## 2016-08-20 DIAGNOSIS — N186 End stage renal disease: Secondary | ICD-10-CM | POA: Diagnosis not present

## 2016-08-23 DIAGNOSIS — D631 Anemia in chronic kidney disease: Secondary | ICD-10-CM | POA: Diagnosis not present

## 2016-08-23 DIAGNOSIS — N2581 Secondary hyperparathyroidism of renal origin: Secondary | ICD-10-CM | POA: Diagnosis not present

## 2016-08-23 DIAGNOSIS — N186 End stage renal disease: Secondary | ICD-10-CM | POA: Diagnosis not present

## 2016-08-26 DIAGNOSIS — N186 End stage renal disease: Secondary | ICD-10-CM | POA: Diagnosis not present

## 2016-08-26 DIAGNOSIS — N2581 Secondary hyperparathyroidism of renal origin: Secondary | ICD-10-CM | POA: Diagnosis not present

## 2016-08-26 DIAGNOSIS — D631 Anemia in chronic kidney disease: Secondary | ICD-10-CM | POA: Diagnosis not present

## 2016-08-29 DIAGNOSIS — D631 Anemia in chronic kidney disease: Secondary | ICD-10-CM | POA: Diagnosis not present

## 2016-08-29 DIAGNOSIS — N2581 Secondary hyperparathyroidism of renal origin: Secondary | ICD-10-CM | POA: Diagnosis not present

## 2016-08-29 DIAGNOSIS — N186 End stage renal disease: Secondary | ICD-10-CM | POA: Diagnosis not present

## 2016-09-02 DIAGNOSIS — N186 End stage renal disease: Secondary | ICD-10-CM | POA: Diagnosis not present

## 2016-09-02 DIAGNOSIS — N2581 Secondary hyperparathyroidism of renal origin: Secondary | ICD-10-CM | POA: Diagnosis not present

## 2016-09-02 DIAGNOSIS — D631 Anemia in chronic kidney disease: Secondary | ICD-10-CM | POA: Diagnosis not present

## 2016-09-03 DIAGNOSIS — N186 End stage renal disease: Secondary | ICD-10-CM | POA: Diagnosis not present

## 2016-09-03 DIAGNOSIS — D631 Anemia in chronic kidney disease: Secondary | ICD-10-CM | POA: Diagnosis not present

## 2016-09-03 DIAGNOSIS — N2581 Secondary hyperparathyroidism of renal origin: Secondary | ICD-10-CM | POA: Diagnosis not present

## 2016-09-05 DIAGNOSIS — N2581 Secondary hyperparathyroidism of renal origin: Secondary | ICD-10-CM | POA: Diagnosis not present

## 2016-09-05 DIAGNOSIS — N186 End stage renal disease: Secondary | ICD-10-CM | POA: Diagnosis not present

## 2016-09-05 DIAGNOSIS — D631 Anemia in chronic kidney disease: Secondary | ICD-10-CM | POA: Diagnosis not present

## 2016-09-07 DIAGNOSIS — N186 End stage renal disease: Secondary | ICD-10-CM | POA: Diagnosis not present

## 2016-09-07 DIAGNOSIS — N2581 Secondary hyperparathyroidism of renal origin: Secondary | ICD-10-CM | POA: Diagnosis not present

## 2016-09-07 DIAGNOSIS — I10 Essential (primary) hypertension: Secondary | ICD-10-CM | POA: Diagnosis not present

## 2016-09-07 DIAGNOSIS — Z992 Dependence on renal dialysis: Secondary | ICD-10-CM | POA: Diagnosis not present

## 2016-09-07 DIAGNOSIS — D631 Anemia in chronic kidney disease: Secondary | ICD-10-CM | POA: Diagnosis not present

## 2016-09-10 DIAGNOSIS — N2581 Secondary hyperparathyroidism of renal origin: Secondary | ICD-10-CM | POA: Diagnosis not present

## 2016-09-10 DIAGNOSIS — R509 Fever, unspecified: Secondary | ICD-10-CM | POA: Diagnosis not present

## 2016-09-10 DIAGNOSIS — N186 End stage renal disease: Secondary | ICD-10-CM | POA: Diagnosis not present

## 2016-09-10 DIAGNOSIS — D631 Anemia in chronic kidney disease: Secondary | ICD-10-CM | POA: Diagnosis not present

## 2016-09-12 DIAGNOSIS — N2581 Secondary hyperparathyroidism of renal origin: Secondary | ICD-10-CM | POA: Diagnosis not present

## 2016-09-12 DIAGNOSIS — N186 End stage renal disease: Secondary | ICD-10-CM | POA: Diagnosis not present

## 2016-09-12 DIAGNOSIS — D631 Anemia in chronic kidney disease: Secondary | ICD-10-CM | POA: Diagnosis not present

## 2016-09-12 DIAGNOSIS — R509 Fever, unspecified: Secondary | ICD-10-CM | POA: Diagnosis not present

## 2016-09-15 DIAGNOSIS — R509 Fever, unspecified: Secondary | ICD-10-CM | POA: Diagnosis not present

## 2016-09-15 DIAGNOSIS — N2581 Secondary hyperparathyroidism of renal origin: Secondary | ICD-10-CM | POA: Diagnosis not present

## 2016-09-15 DIAGNOSIS — D631 Anemia in chronic kidney disease: Secondary | ICD-10-CM | POA: Diagnosis not present

## 2016-09-15 DIAGNOSIS — N186 End stage renal disease: Secondary | ICD-10-CM | POA: Diagnosis not present

## 2016-09-17 DIAGNOSIS — D631 Anemia in chronic kidney disease: Secondary | ICD-10-CM | POA: Diagnosis not present

## 2016-09-17 DIAGNOSIS — N2581 Secondary hyperparathyroidism of renal origin: Secondary | ICD-10-CM | POA: Diagnosis not present

## 2016-09-17 DIAGNOSIS — N186 End stage renal disease: Secondary | ICD-10-CM | POA: Diagnosis not present

## 2016-09-17 DIAGNOSIS — R509 Fever, unspecified: Secondary | ICD-10-CM | POA: Diagnosis not present

## 2016-09-18 ENCOUNTER — Encounter (HOSPITAL_COMMUNITY): Payer: Self-pay | Admitting: Emergency Medicine

## 2016-09-18 ENCOUNTER — Emergency Department (HOSPITAL_COMMUNITY): Payer: Medicare Other

## 2016-09-18 ENCOUNTER — Emergency Department (HOSPITAL_COMMUNITY)
Admission: EM | Admit: 2016-09-18 | Discharge: 2016-09-19 | Disposition: A | Discharge status: Home / Self Care | Payer: Medicare Other | Attending: Emergency Medicine | Admitting: Emergency Medicine

## 2016-09-18 DIAGNOSIS — I5043 Acute on chronic combined systolic (congestive) and diastolic (congestive) heart failure: Secondary | ICD-10-CM | POA: Insufficient documentation

## 2016-09-18 DIAGNOSIS — N186 End stage renal disease: Secondary | ICD-10-CM | POA: Insufficient documentation

## 2016-09-18 DIAGNOSIS — K310 Acute dilatation of stomach: Secondary | ICD-10-CM | POA: Diagnosis not present

## 2016-09-18 DIAGNOSIS — R140 Abdominal distension (gaseous): Secondary | ICD-10-CM | POA: Diagnosis not present

## 2016-09-18 DIAGNOSIS — I120 Hypertensive chronic kidney disease with stage 5 chronic kidney disease or end stage renal disease: Secondary | ICD-10-CM | POA: Diagnosis not present

## 2016-09-18 DIAGNOSIS — Z87891 Personal history of nicotine dependence: Secondary | ICD-10-CM | POA: Diagnosis not present

## 2016-09-18 DIAGNOSIS — R188 Other ascites: Secondary | ICD-10-CM | POA: Diagnosis not present

## 2016-09-18 DIAGNOSIS — Z79899 Other long term (current) drug therapy: Secondary | ICD-10-CM | POA: Diagnosis not present

## 2016-09-18 DIAGNOSIS — R911 Solitary pulmonary nodule: Secondary | ICD-10-CM | POA: Insufficient documentation

## 2016-09-18 DIAGNOSIS — N63 Unspecified lump in unspecified breast: Secondary | ICD-10-CM | POA: Diagnosis not present

## 2016-09-18 DIAGNOSIS — Z992 Dependence on renal dialysis: Secondary | ICD-10-CM | POA: Diagnosis not present

## 2016-09-18 DIAGNOSIS — I132 Hypertensive heart and chronic kidney disease with heart failure and with stage 5 chronic kidney disease, or end stage renal disease: Secondary | ICD-10-CM | POA: Diagnosis not present

## 2016-09-18 DIAGNOSIS — Z7982 Long term (current) use of aspirin: Secondary | ICD-10-CM | POA: Diagnosis not present

## 2016-09-18 DIAGNOSIS — R079 Chest pain, unspecified: Secondary | ICD-10-CM | POA: Diagnosis not present

## 2016-09-18 DIAGNOSIS — I252 Old myocardial infarction: Secondary | ICD-10-CM | POA: Insufficient documentation

## 2016-09-18 DIAGNOSIS — I2510 Atherosclerotic heart disease of native coronary artery without angina pectoris: Secondary | ICD-10-CM | POA: Insufficient documentation

## 2016-09-18 LAB — COMPREHENSIVE METABOLIC PANEL
ALBUMIN: 2.8 g/dL — AB (ref 3.5–5.0)
ALT: 19 U/L (ref 17–63)
ANION GAP: 14 (ref 5–15)
AST: 50 U/L — ABNORMAL HIGH (ref 15–41)
Alkaline Phosphatase: 168 U/L — ABNORMAL HIGH (ref 38–126)
BUN: 40 mg/dL — ABNORMAL HIGH (ref 6–20)
CHLORIDE: 100 mmol/L — AB (ref 101–111)
CO2: 24 mmol/L (ref 22–32)
Calcium: 9.6 mg/dL (ref 8.9–10.3)
Creatinine, Ser: 6.5 mg/dL — ABNORMAL HIGH (ref 0.61–1.24)
GFR calc non Af Amer: 8 mL/min — ABNORMAL LOW (ref >60)
GFR, EST AFRICAN AMERICAN: 9 mL/min — AB (ref >60)
GLUCOSE: 110 mg/dL — AB (ref 65–99)
Potassium: 4.9 mmol/L (ref 3.5–5.1)
SODIUM: 138 mmol/L (ref 135–145)
Total Bilirubin: 3.3 mg/dL — ABNORMAL HIGH (ref 0.3–1.2)
Total Protein: 8.1 g/dL (ref 6.5–8.1)

## 2016-09-18 LAB — CBC WITH DIFFERENTIAL/PLATELET
BASOS PCT: 1 %
Basophils Absolute: 0 10*3/uL (ref 0.0–0.1)
EOS ABS: 0.1 10*3/uL (ref 0.0–0.7)
EOS PCT: 2 %
HCT: 34.8 % — ABNORMAL LOW (ref 39.0–52.0)
HEMOGLOBIN: 11.9 g/dL — AB (ref 13.0–17.0)
Lymphocytes Relative: 16 %
Lymphs Abs: 0.5 10*3/uL — ABNORMAL LOW (ref 0.7–4.0)
MCH: 27.1 pg (ref 26.0–34.0)
MCHC: 34.2 g/dL (ref 30.0–36.0)
MCV: 79.3 fL (ref 78.0–100.0)
Monocytes Absolute: 0.7 10*3/uL (ref 0.1–1.0)
Monocytes Relative: 23 %
NEUTROS PCT: 58 %
Neutro Abs: 1.8 10*3/uL (ref 1.7–7.7)
PLATELETS: 72 10*3/uL — AB (ref 150–400)
RBC: 4.39 MIL/uL (ref 4.22–5.81)
RDW: 17.9 % — ABNORMAL HIGH (ref 11.5–15.5)
WBC: 3 10*3/uL — AB (ref 4.0–10.5)

## 2016-09-18 LAB — LIPASE, BLOOD: Lipase: 83 U/L — ABNORMAL HIGH (ref 11–51)

## 2016-09-18 NOTE — ED Provider Notes (Signed)
[facility]-EMERGENCY DEPT Provider Note   CSN: [id number] Arrival date & time: 09/18/16  1541     History   Chief Complaint Chief Complaint  Patient presents with  . Bloated  . Cyst    on chest    HPI Trevor [name] is a 66 y.o. male.  HPI   66 year old male with history of end-stage renal disease currently on Monday Wednesday and Friday dialysis, chronic abdominal pain, GERD, coagulopathy, recurrent abscess presenting to the ED with complaint of abdominal bloating. Patient report for the past month he has had progressive swelling and bloating sensation in his abdomen causing him to have early satiety, trouble breathing, and symptoms felt similar to prior ascites that require paracentesis. Last paracentesis with a year ago. He did schedule an appointment with his PCP for this symptom and was seen today subsequently told to come to ER for further management. He endorsed generalized fatigue, decrease in appetite, having some loose stools. He denies fever, dysuria, back pain, or rash. Last dialysis session was yesterday. Denies alcohol or drug use.  Patient also reported having a "knot on my chest" that has been present for several weeks, nontender and denies any injury causing it.   Past Medical History:  Diagnosis Date  . Acute on chronic systolic right heart failure (HCC) 11/14/2015  . Anemia   . Atrial fibrillation (HCC)    not on coumadin due to large spontaneous hematoma on back and anemia  . Atrial flutter (HCC) 04/27/2013  . AVF (arteriovenous fistula) (HCC)    Left  . CHF (congestive heart failure) (HCC)    EF 20-25%  . Coronary artery disease   . Diverticulosis   . Dysrhythmia    afib  . Elevated troponin 11/14/2015  . ESRD (end stage renal disease) on dialysis (HCC)    [facility] mon/wed/fri  . Exertional shortness of breath    "related to infection in my lungs right now" (05/03/2013)  . GERD (gastroesophageal reflux disease)   . Hepatosplenomegaly 10/18/2015   Workup  per Dr Kaplan/ GI in Dec 2015 > abd pain improved after paracentesis x 2 (1.2L, 1.3L). Had negative hepA/ hep B/ hep C testing. For hepatosplenomagealy pt underwent transjugular liver biopsy by IR with hepatic vein wedge pressure measurement which showed elevated right heart and free hepatic venous pressures likely due to right heart failure.  There was no evidence of portal hypertension. Liver bx originally was reported as "End stage liver disease/ cirrhosis", then was corrected > "No evidence of cirrhosis".  The corrected liver biopsy result reads "SINUSOIDAL DILATATION WITH SCATTERED FOCI OF HEPATITIS".  Last GI visit was Aug 2016 for bloating/ abd pain, noted he had a normal gastric empty study.  -Right lobe liver lesion. Felt to be hemangioma, Biopsy pending.  -thrombocytopenia. Dates back to 08/2013.  -ESRD. MWF HD.  -biventricular heart failure, acute right sided heart failure. Daily HD to address volume overload.    Marland Kitchen Hereditary and idiopathic peripheral neuropathy 08/29/2015  . History of gout    "before I started doing the dialysis" (05/03/2013)  . Hypertension   . Hypertensive urgency    H/o  . Hypovitaminosis D   . Myocardial infarction 90's  . Peripheral vascular disease (HCC)    dvt leg 12/14  . Pulmonary embolism (HCC)    with right DVT secondary to recent surgery  . Rectal bleeding 11/2015  . Renal insufficiency   . S/P repair of ventral hernia 03/21/2014  . Secondary hyperparathyroidism (HCC)   .  Sleep apnea   . Syncope    felt secondary to residual anesthesia the day before - 2D echo unremarkable    Patient Active Problem List   Diagnosis Date Noted  . Pruritus 04/15/2016  . Chronic rhinitis 04/15/2016  . Hypoglycemia 03/24/2016  . Hematochezia   . Chronic RUQ pain   . Gastritis   . Rectal bleeding 11/28/2015  . GI bleed 11/28/2015  . NICM (nonischemic cardiomyopathy) (HCC)   . Syncope and collapse   . Chest pain 11/14/2015  . Acute on chronic systolic right heart  failure (HCC) 11/14/2015  . Elevated troponin 11/14/2015  . End-stage renal disease on hemodialysis (HCC)   . Syncope 10/30/2015  . Chronic combined systolic and diastolic heart failure (HCC) 10/30/2015  . Hepatosplenomegaly 10/18/2015  . Intractable abdominal pain 10/14/2015  . Hereditary and idiopathic peripheral neuropathy 08/29/2015  . Bloating 04/19/2015  . Hepatic cirrhosis (HCC) 04/19/2015  . Palliative care encounter 09/09/2014  . Abdominal distension   . Acute on chronic combined systolic and diastolic heart failure (HCC)   . RVF (right ventricular failure) (HCC)   . Chronic atrial fibrillation (HCC)   . Abdominal pain, chronic, epigastric 08/31/2014  . Ascites 08/30/2014  . Cough syncope 08/30/2014  . Pancytopenia (HCC) 08/30/2014  . DCM- EF 25-30% by echo 09/04/14 03/01/2014  . Coronary artery calcification seen on CAT scan 03/01/2014  . Mechanical complication of other vascular device, implant, and graft 11/24/2013  . Cellulitis and abscess of leg 08/15/2013  . Pulmonary embolism (HCC)   . DVT of lower extremity (deep venous thrombosis) RLE 06/29/2013  . GERD (gastroesophageal reflux disease)   . Cough 05/17/2013  . ESRD on hemodialysis (HCC) 05/03/2013  . Obstructive sleep apnea 05/02/2010  . DISORDERS OF PHOSPHORUS METABOLISM 02/24/2008  . MYALGIA 02/23/2008  . UNSPECIFIED VITAMIN DEFICIENCY 12/22/2007  . ANEMIA, IRON DEFICIENCY 12/22/2007  . Essential hypertension 12/22/2007  . Secondary renal hyperparathyroidism (HCC) 12/22/2007    Past Surgical History:  Procedure Laterality Date  . AV FISTULA PLACEMENT Left    Dr. Charlean Sanfilippo; "I've had 2 on the left' (05/03/2013)  . AV FISTULA PLACEMENT Right ~ 2011  . AVGG REMOVAL Right 05/04/2013   Procedure: REMOVAL OF ARTERIOVENOUS Fistula Right Arm;  Surgeon: [name], MD;  Location: [facility] OR;  Service: Vascular;  Laterality: Right;  . BASCILIC VEIN TRANSPOSITION Left 06/27/2013   Procedure: BASCILIC VEIN  TRANSPOSITION;  Surgeon: [name], MD;  Location: [facility] OR;  Service: Vascular;  Laterality: Left;  . CARDIAC CATHETERIZATION N/A 11/15/2015   Procedure: Right Heart Cath;  Surgeon: Lennette Bihari, MD;  Location: [facility] INVASIVE CV LAB;  Service: Cardiovascular;  Laterality: N/A;  . CARDIAC CATHETERIZATION Right 11/15/2015   Procedure: Left Heart Cath;  Surgeon: Lennette Bihari, MD;  Location: [facility] INVASIVE CV LAB;  Service: Cardiovascular;  Laterality: Right;  . CARDIOVERSION N/A 08/29/2013   Procedure: CARDIOVERSION;  Surgeon: [name], MD;  Location: [facility] ENDOSCOPY;  Service: Cardiovascular;  Laterality: N/A;  . COLONOSCOPY  10-29-2005   Hx: of  . COLONOSCOPY N/A 11/30/2015   Procedure: COLONOSCOPY;  Surgeon: Meryl Dare, MD;  Location: [facility] ENDOSCOPY;  Service: Endoscopy;  Laterality: N/A;  . ESOPHAGOGASTRODUODENOSCOPY N/A 11/30/2015   Procedure: ESOPHAGOGASTRODUODENOSCOPY (EGD);  Surgeon: Meryl Dare, MD;  Location: [facility] ENDOSCOPY;  Service: Endoscopy;  Laterality: N/A;  . HERNIA REPAIR  03/21/14   Umbilical hernia-Dr. Derrell Lolling  . INSERTION OF DIALYSIS CATHETER Right 05/04/2013   Procedure: INSERTION OF DIALYSIS CATHETER;  Surgeon: [name], MD;  Location: [facility] OR;  Service: Vascular;  Laterality: Right;  . INSERTION OF MESH N/A 03/21/2014   Procedure: INSERTION OF MESH;  Surgeon: Axel Filler, MD;  Location: [facility] OR;  Service: General;  Laterality: N/A;  . KNEE ARTHROSCOPY Left   . REMOVAL OF A DIALYSIS CATHETER  2/15  . UMBILICAL HERNIA REPAIR N/A 03/21/2014   Procedure: LAPAROSCOPIC UMBILICAL HERNIA REPAIR WITH MESH;  Surgeon: Axel Filler, MD;  Location: [facility] OR;  Service: General;  Laterality: N/A;  . VENOGRAM Left 05/26/2013   Procedure: VENOGRAM;  Surgeon: Fransisco Hertz, MD;  Location: [facility] CATH LAB;  Service: Cardiovascular;  Laterality: Left;       Home Medications    Prior to Admission medications   Medication Sig Start Date End Date Taking? Authorizing Provider    albuterol (PROVENTIL HFA;VENTOLIN HFA) 108 (90 Base) MCG/ACT inhaler Inhale 2 puffs into the lungs every 4 (four) hours as needed for wheezing or shortness of breath.    Historical Provider, MD  aspirin EC 325 MG EC tablet Take 1 tablet (325 mg total) by mouth daily. 03/26/16   Richarda Overlie, MD  B Complex-C (B-COMPLEX WITH VITAMIN C) tablet Take 1 tablet by mouth daily.    Historical Provider, MD  cephALEXin (KEFLEX) 250 MG capsule Take 1 capsule (250 mg total) by mouth 2 (two) times daily. 06/22/16   Tobi Bastos, NP  fluticasone (FLONASE) 50 MCG/ACT nasal spray 1-2 Sprays per nostril daily as needed. 04/15/16   Cristal Ford, MD  hydrOXYzine (ATARAX/VISTARIL) 10 MG tablet Take 1 tablet (10 mg total) by mouth every 6 (six) hours as needed for itching. 03/26/16   Richarda Overlie, MD  labetalol (NORMODYNE) 200 MG tablet Take 200 mg by mouth 2 (two) times daily. 12/24/15   Historical Provider, MD  montelukast (SINGULAIR) 10 MG tablet Take 10 mg by mouth every evening. 03/13/16   Historical Provider, MD  nitroGLYCERIN (NITROSTAT) 0.4 MG SL tablet Place 1 tablet (0.4 mg total) under the tongue every 5 (five) minutes as needed for chest pain. 11/17/15   Rodolph Bong, MD  Nutritional Supplements (FEEDING SUPPLEMENT, NEPRO CARB STEADY,) LIQD Take 237 mLs by mouth 2 (two) times daily between meals. 03/26/16   Richarda Overlie, MD  oxyCODONE-acetaminophen (PERCOCET/ROXICET) 5-325 MG tablet Take 1 tablet by mouth every 6 (six) hours as needed. Pain 10/29/15   Historical Provider, MD  RENVELA 800 MG tablet  03/20/16   Historical Provider, MD    Family History Family History  Problem Relation Age of Onset  . Hypertension Father   . Kidney disease Father   . Allergies Father   . Deep vein thrombosis Sister   . Pulmonary embolism Sister   . Diabetes Paternal Grandmother     Social History Social History  Substance Use Topics  . Smoking status: Former Smoker    Packs/day: 0.25    Years: 0.50     Types: Cigarettes    Quit date: 09/08/1972  . Smokeless tobacco: Never Used  . Alcohol use No     Comment: 05/03/2013 "haven't had a glass of wine in ~ 3 months or so; sometimes will have one w/dinner"     Allergies   Pork-derived products   Review of Systems Review of Systems  All other systems reviewed and are negative.    Physical Exam Updated Vital Signs BP 119/88   Pulse 78   Temp 98.4 F (36.9 C) (Oral)   Resp 16  SpO2 96%   Physical Exam  Constitutional: He appears well-developed and well-nourished. No distress.  Chronically ill-appearing male laying in bed nontoxic in appearance  HENT:  Head: Atraumatic.  Eyes: Conjunctivae are normal.  Neck: Neck supple.  Cardiovascular: Normal rate and regular rhythm.   Pulmonary/Chest: Effort normal and breath sounds normal. He has no wheezes. He has no rales.  Chest: Subcutaneous firm nodule noted in to the right areolar region, without fluctuance, no erythema.  No nipple discharge.   Abdominal: He exhibits distension. There is no rebound and no guarding.  Hepatosplenomegaly. Abdomen is distended and mildly tender with positive fluid wave.  Musculoskeletal: He exhibits edema (1+ edema to bilateral lower extremities).  Neurological: He is alert.  Skin: No rash noted.  Psychiatric: He has a normal mood and affect.  Nursing note and vitals reviewed.    ED Treatments / Results  Labs (all labs ordered are listed, but only abnormal results are displayed) Labs Reviewed  CBC WITH DIFFERENTIAL/PLATELET - Abnormal; Notable for the following:       Result Value   WBC 3.0 (*)    Hemoglobin 11.9 (*)    HCT 34.8 (*)    RDW 17.9 (*)    Platelets 72 (*)    Lymphs Abs 0.5 (*)    All other components within normal limits  COMPREHENSIVE METABOLIC PANEL - Abnormal; Notable for the following:    Chloride 100 (*)    Glucose, Bld 110 (*)    BUN 40 (*)    Creatinine, Ser 6.50 (*)    Albumin 2.8 (*)    AST 50 (*)    Alkaline  Phosphatase 168 (*)    Total Bilirubin 3.3 (*)    GFR calc non Af Amer 8 (*)    GFR calc Af Amer 9 (*)    All other components within normal limits  LIPASE, BLOOD - Abnormal; Notable for the following:    Lipase 83 (*)    All other components within normal limits    EKG  EKG Interpretation None       Radiology Dg Chest 2 View  Result Date: 09/18/2016 CLINICAL DATA:  Abdominal and chest pain EXAM: CHEST  2 VIEW COMPARISON:  Chest radiograph 03/23/2016 FINDINGS: There is massive cardiomegaly, unchanged, with calcification within the aortic arch. There are bilateral interstitial opacities likely indicating mild interstitial edema. No pneumothorax or pleural effusion. No focal airspace consolidation. IMPRESSION: 1. Massive cardiomegaly and aortic atherosclerosis. 2. Mild interstitial pulmonary edema. Electronically Signed   By: Deatra Robinson M.D.   On: 09/18/2016 23:52    Procedures Procedures (including critical care time)  Medications Ordered in ED Medications - No data to display   Initial Impression / Assessment and Plan / ED Course  I have reviewed the triage vital signs and the nursing notes.  Pertinent labs & imaging results that were available during my care of the patient were reviewed by me and considered in my medical decision making (see chart for details).  Clinical Course     BP 120/95   Pulse 71   Temp 98.4 F (36.9 C) (Oral)   Resp 16   SpO2 100%    Final Clinical Impressions(s) / ED Diagnoses   Final diagnoses:  Abdominal bloating  Other ascites  Dialysis patient [facility])    New Prescriptions New Prescriptions   No medications on file   10:23 PM Patient here with complaints of abdominal bloating. It appears patient has an ascites, that may benefit from  paracentesis.  No fever, no evidence of infection to suggest SBP.    12:01 AM Labs are not baseline. Normal potassium level. Mildly elevated lipase of 83. Chest x-ray similar to prior. Mild  interstitial edema without any pleural effusion. Ivory shout to our ultrasound specialist who provided a phone number for patient call tomorrow in order to schedule an outpatient ultrasound paracentesis. The number is [phone].  Pt stable for discharge.  Care discussed with Dr. Clayborne Dana.      Fayrene Helper, PA-C 09/19/16 0007    Marily Memos, MD 09/22/16 [phone]

## 2016-09-18 NOTE — ED Triage Notes (Signed)
Pt. Stated, I just came from the doctor's office cause Im having stomach bloated. I have kidney failure and Im on dialysis but Im so bloated. I also have a knot on my chest that needs to be ultrasound. I have dialysis Mon, wed. And Friday.

## 2016-09-19 ENCOUNTER — Encounter (HOSPITAL_COMMUNITY): Payer: Self-pay

## 2016-09-19 ENCOUNTER — Emergency Department (HOSPITAL_COMMUNITY): Payer: Medicare Other

## 2016-09-19 ENCOUNTER — Emergency Department (HOSPITAL_COMMUNITY)
Admission: EM | Admit: 2016-09-19 | Discharge: 2016-09-19 | Disposition: A | Discharge status: Home / Self Care | Payer: Medicare Other | Attending: Emergency Medicine | Admitting: Emergency Medicine

## 2016-09-19 DIAGNOSIS — Z87891 Personal history of nicotine dependence: Secondary | ICD-10-CM | POA: Insufficient documentation

## 2016-09-19 DIAGNOSIS — R188 Other ascites: Secondary | ICD-10-CM

## 2016-09-19 DIAGNOSIS — Z7982 Long term (current) use of aspirin: Secondary | ICD-10-CM | POA: Insufficient documentation

## 2016-09-19 DIAGNOSIS — I2510 Atherosclerotic heart disease of native coronary artery without angina pectoris: Secondary | ICD-10-CM | POA: Diagnosis not present

## 2016-09-19 DIAGNOSIS — R1084 Generalized abdominal pain: Secondary | ICD-10-CM | POA: Diagnosis not present

## 2016-09-19 DIAGNOSIS — K2970 Gastritis, unspecified, without bleeding: Secondary | ICD-10-CM | POA: Diagnosis not present

## 2016-09-19 DIAGNOSIS — N186 End stage renal disease: Secondary | ICD-10-CM | POA: Diagnosis not present

## 2016-09-19 DIAGNOSIS — I5043 Acute on chronic combined systolic (congestive) and diastolic (congestive) heart failure: Secondary | ICD-10-CM | POA: Insufficient documentation

## 2016-09-19 DIAGNOSIS — R109 Unspecified abdominal pain: Secondary | ICD-10-CM | POA: Insufficient documentation

## 2016-09-19 DIAGNOSIS — I252 Old myocardial infarction: Secondary | ICD-10-CM | POA: Diagnosis not present

## 2016-09-19 DIAGNOSIS — R10814 Left lower quadrant abdominal tenderness: Secondary | ICD-10-CM | POA: Diagnosis not present

## 2016-09-19 DIAGNOSIS — I132 Hypertensive heart and chronic kidney disease with heart failure and with stage 5 chronic kidney disease, or end stage renal disease: Secondary | ICD-10-CM | POA: Insufficient documentation

## 2016-09-19 DIAGNOSIS — Z79899 Other long term (current) drug therapy: Secondary | ICD-10-CM | POA: Diagnosis not present

## 2016-09-19 LAB — BODY FLUID CELL COUNT WITH DIFFERENTIAL
EOS FL: 0 %
Lymphs, Fluid: 90 %
Monocyte-Macrophage-Serous Fluid: 5 % — ABNORMAL LOW (ref 50–90)
Neutrophil Count, Fluid: 5 % (ref 0–25)
Total Nucleated Cell Count, Fluid: 727 cu mm (ref 0–1000)

## 2016-09-19 LAB — PROTEIN, BODY FLUID

## 2016-09-19 LAB — GLUCOSE, SEROUS FLUID: GLUCOSE FL: 77 mg/dL

## 2016-09-19 LAB — LACTATE DEHYDROGENASE, PLEURAL OR PERITONEAL FLUID: LD FL: 83 U/L — AB (ref 3–23)

## 2016-09-19 LAB — ALBUMIN, FLUID (OTHER): Albumin, Fluid: 1.1 g/dL

## 2016-09-19 MED ORDER — MORPHINE SULFATE (PF) 4 MG/ML IV SOLN
4.0000 mg | Freq: Once | INTRAVENOUS | Status: AC
  Administered 2016-09-19: 4 mg via INTRAVENOUS
  Filled 2016-09-19: qty 1

## 2016-09-19 NOTE — ED Notes (Signed)
Bed: WA06 Expected date:  Expected time:  Means of arrival:  Comments: 66 yr old M, short of breath

## 2016-09-19 NOTE — ED Triage Notes (Signed)
Pt was brought in by EMS from home with c/o of SOB and worsening abdominal distention, Pt was seen at  [facility] ED and was d/c'd 09/18/16  yesterday for ascites. Pt is schedule for Abd. [place] today at [facility]. Pt is a dialysis pt. Pt oxygen saturation was 94% on RA and was given 2 lpm of oxygen via [place] for comfort and brought pt oxygen up to 97%.

## 2016-09-19 NOTE — Discharge Instructions (Signed)
Please call [phone] to speak with ultrasound specialist tomorrow to help schedule an appointment for a therapeutic paracentesis.  Follow up with your doctor for further management of your health.

## 2016-09-19 NOTE — ED Notes (Signed)
Patient in ultrasound.

## 2016-09-19 NOTE — Procedures (Signed)
Ultrasound-guided diagnostic and therapeutic paracentesis performed yielding 200 cc  of hazy, amber/blood-tinged  fluid. No immediate complications. The fluid was sent to the lab for preordered studies. Only a SMALL amount of ascites was noted on today's limited US abd despite pt abd fullness. Rec CT A/P if symptoms persists.

## 2016-09-19 NOTE — ED Notes (Signed)
[place] states they are sending transport to take patient for US guided paracentesis.

## 2016-09-19 NOTE — ED Provider Notes (Signed)
[facility]-EMERGENCY DEPT Provider Note   CSN: [id number] Arrival date & time: 09/19/16  0457     History   Chief Complaint Chief Complaint  Patient presents with  . Shortness of Breath  . Abdominal Pain    HPI Trevor [name] is a 66 y.o. male.  Patient is a 66 year old male with extensive past medical history including end-stage renal disease on hemodialysis, CHF, atrial fibrillation, and cirrhosis with previous ascites/paracentesis. He presents today for evaluation of increased abdominal distention, abdominal pain, and difficulty breathing, that has worsened over the past month. He was seen yesterday at [facility] and was scheduled for an outpatient paracentesis. Since arriving home, patient reports increased discomfort in his abdomen and cannot wait until the scheduled time for this procedure. He denies any fevers or chills. He denies any vomiting or diarrhea.   The history is provided by the patient.  Abdominal Pain   This is a new problem. Episode onset: One month ago. The problem occurs constantly. The problem has been gradually worsening. The pain is located in the generalized abdominal region. The pain is moderate. Nothing aggravates the symptoms. Nothing relieves the symptoms.    Past Medical History:  Diagnosis Date  . Acute on chronic systolic right heart failure (HCC) 11/14/2015  . Anemia   . Atrial fibrillation (HCC)    not on coumadin due to large spontaneous hematoma on back and anemia  . Atrial flutter (HCC) 04/27/2013  . AVF (arteriovenous fistula) (HCC)    Left  . CHF (congestive heart failure) (HCC)    EF 20-25%  . Coronary artery disease   . Diverticulosis   . Dysrhythmia    afib  . Elevated troponin 11/14/2015  . ESRD (end stage renal disease) on dialysis (HCC)    [facility] mon/wed/fri  . Exertional shortness of breath    "related to infection in my lungs right now" (05/03/2013)  . GERD (gastroesophageal reflux disease)   . Hepatosplenomegaly  10/18/2015   Workup per Dr Kaplan/ GI in Dec 2015 > abd pain improved after paracentesis x 2 (1.2L, 1.3L). Had negative hepA/ hep B/ hep C testing. For hepatosplenomagealy pt underwent transjugular liver biopsy by IR with hepatic vein wedge pressure measurement which showed elevated right heart and free hepatic venous pressures likely due to right heart failure.  There was no evidence of portal hypertension. Liver bx originally was reported as "End stage liver disease/ cirrhosis", then was corrected > "No evidence of cirrhosis".  The corrected liver biopsy result reads "SINUSOIDAL DILATATION WITH SCATTERED FOCI OF HEPATITIS".  Last GI visit was Aug 2016 for bloating/ abd pain, noted he had a normal gastric empty study.  -Right lobe liver lesion. Felt to be hemangioma, Biopsy pending.  -thrombocytopenia. Dates back to 08/2013.  -ESRD. MWF HD.  -biventricular heart failure, acute right sided heart failure. Daily HD to address volume overload.    Marland Kitchen Hereditary and idiopathic peripheral neuropathy 08/29/2015  . History of gout    "before I started doing the dialysis" (05/03/2013)  . Hypertension   . Hypertensive urgency    H/o  . Hypovitaminosis D   . Myocardial infarction 90's  . Peripheral vascular disease (HCC)    dvt leg 12/14  . Pulmonary embolism (HCC)    with right DVT secondary to recent surgery  . Rectal bleeding 11/2015  . Renal insufficiency   . S/P repair of ventral hernia 03/21/2014  . Secondary hyperparathyroidism (HCC)   . Sleep apnea   .  Syncope    felt secondary to residual anesthesia the day before - 2D echo unremarkable    Patient Active Problem List   Diagnosis Date Noted  . Pruritus 04/15/2016  . Chronic rhinitis 04/15/2016  . Hypoglycemia 03/24/2016  . Hematochezia   . Chronic RUQ pain   . Gastritis   . Rectal bleeding 11/28/2015  . GI bleed 11/28/2015  . NICM (nonischemic cardiomyopathy) (HCC)   . Syncope and collapse   . Chest pain 11/14/2015  . Acute on chronic  systolic right heart failure (HCC) 11/14/2015  . Elevated troponin 11/14/2015  . End-stage renal disease on hemodialysis (HCC)   . Syncope 10/30/2015  . Chronic combined systolic and diastolic heart failure (HCC) 10/30/2015  . Hepatosplenomegaly 10/18/2015  . Intractable abdominal pain 10/14/2015  . Hereditary and idiopathic peripheral neuropathy 08/29/2015  . Bloating 04/19/2015  . Hepatic cirrhosis (HCC) 04/19/2015  . Palliative care encounter 09/09/2014  . Abdominal distension   . Acute on chronic combined systolic and diastolic heart failure (HCC)   . RVF (right ventricular failure) (HCC)   . Chronic atrial fibrillation (HCC)   . Abdominal pain, chronic, epigastric 08/31/2014  . Ascites 08/30/2014  . Cough syncope 08/30/2014  . Pancytopenia (HCC) 08/30/2014  . DCM- EF 25-30% by echo 09/04/14 03/01/2014  . Coronary artery calcification seen on CAT scan 03/01/2014  . Mechanical complication of other vascular device, implant, and graft 11/24/2013  . Cellulitis and abscess of leg 08/15/2013  . Pulmonary embolism (HCC)   . DVT of lower extremity (deep venous thrombosis) RLE 06/29/2013  . GERD (gastroesophageal reflux disease)   . Cough 05/17/2013  . ESRD on hemodialysis (HCC) 05/03/2013  . Obstructive sleep apnea 05/02/2010  . DISORDERS OF PHOSPHORUS METABOLISM 02/24/2008  . MYALGIA 02/23/2008  . UNSPECIFIED VITAMIN DEFICIENCY 12/22/2007  . ANEMIA, IRON DEFICIENCY 12/22/2007  . Essential hypertension 12/22/2007  . Secondary renal hyperparathyroidism (HCC) 12/22/2007    Past Surgical History:  Procedure Laterality Date  . AV FISTULA PLACEMENT Left    Dr. Charlean Sanfilippo; "I've had 2 on the left' (05/03/2013)  . AV FISTULA PLACEMENT Right ~ 2011  . AVGG REMOVAL Right 05/04/2013   Procedure: REMOVAL OF ARTERIOVENOUS Fistula Right Arm;  Surgeon: [name], MD;  Location[facility] OR;  Service: Vascular;  Laterality: Right;  . BASCILIC VEIN TRANSPOSITION Left 06/27/2013   Procedure:  BASCILIC VEIN TRANSPOSITION;  Surgeon: [name], MD;  Location: [facility] OR;  Service: Vascular;  Laterality: Left;  . CARDIAC CATHETERIZATION N/A 11/15/2015   Procedure: Right Heart Cath;  Surgeon: Lennette Bihari, MD;  Location: [facility] INVASIVE CV LAB;  Service: Cardiovascular;  Laterality: N/A;  . CARDIAC CATHETERIZATION Right 11/15/2015   Procedure: Left Heart Cath;  Surgeon: Lennette Bihari, MD;  Location: [facility] INVASIVE CV LAB;  Service: Cardiovascular;  Laterality: Right;  . CARDIOVERSION N/A 08/29/2013   Procedure: CARDIOVERSION;  Surgeon: [name], MD;  Location: [facility] ENDOSCOPY;  Service: Cardiovascular;  Laterality: N/A;  . COLONOSCOPY  10-29-2005   Hx: of  . COLONOSCOPY N/A 11/30/2015   Procedure: COLONOSCOPY;  Surgeon: Meryl Dare, MD;  Location: [facility] ENDOSCOPY;  Service: Endoscopy;  Laterality: N/A;  . ESOPHAGOGASTRODUODENOSCOPY N/A 11/30/2015   Procedure: ESOPHAGOGASTRODUODENOSCOPY (EGD);  Surgeon: Meryl Dare, MD;  Location: [facility] ENDOSCOPY;  Service: Endoscopy;  Laterality: N/A;  . HERNIA REPAIR  03/21/14   Umbilical hernia-Dr. Derrell Lolling  . INSERTION OF DIALYSIS CATHETER Right 05/04/2013   Procedure: INSERTION OF DIALYSIS CATHETER;  Surgeon: [name],  MD;  Location: [facility] OR;  Service: Vascular;  Laterality: Right;  . INSERTION OF MESH N/A 03/21/2014   Procedure: INSERTION OF MESH;  Surgeon: Axel Filler, MD;  Location: [facility] OR;  Service: General;  Laterality: N/A;  . KNEE ARTHROSCOPY Left   . REMOVAL OF A DIALYSIS CATHETER  2/15  . UMBILICAL HERNIA REPAIR N/A 03/21/2014   Procedure: LAPAROSCOPIC UMBILICAL HERNIA REPAIR WITH MESH;  Surgeon: Axel Filler, MD;  Location: [facility] OR;  Service: General;  Laterality: N/A;  . VENOGRAM Left 05/26/2013   Procedure: VENOGRAM;  Surgeon: Fransisco Hertz, MD;  Location: [facility] CATH LAB;  Service: Cardiovascular;  Laterality: Left;       Home Medications    Prior to Admission medications   Medication Sig Start Date End Date Taking? Authorizing  Provider  albuterol (PROVENTIL HFA;VENTOLIN HFA) 108 (90 Base) MCG/ACT inhaler Inhale 2 puffs into the lungs every 4 (four) hours as needed for wheezing or shortness of breath.    Historical Provider, MD  aspirin EC 325 MG EC tablet Take 1 tablet (325 mg total) by mouth daily. 03/26/16   Richarda Overlie, MD  B Complex-C (B-COMPLEX WITH VITAMIN C) tablet Take 1 tablet by mouth daily.    Historical Provider, MD  cephALEXin (KEFLEX) 250 MG capsule Take 1 capsule (250 mg total) by mouth 2 (two) times daily. 06/22/16   Tobi Bastos, NP  fluticasone (FLONASE) 50 MCG/ACT nasal spray 1-2 Sprays per nostril daily as needed. 04/15/16   Cristal Ford, MD  hydrOXYzine (ATARAX/VISTARIL) 10 MG tablet Take 1 tablet (10 mg total) by mouth every 6 (six) hours as needed for itching. 03/26/16   Richarda Overlie, MD  labetalol (NORMODYNE) 200 MG tablet Take 200 mg by mouth 2 (two) times daily. 12/24/15   Historical Provider, MD  montelukast (SINGULAIR) 10 MG tablet Take 10 mg by mouth every evening. 03/13/16   Historical Provider, MD  nitroGLYCERIN (NITROSTAT) 0.4 MG SL tablet Place 1 tablet (0.4 mg total) under the tongue every 5 (five) minutes as needed for chest pain. 11/17/15   Rodolph Bong, MD  Nutritional Supplements (FEEDING SUPPLEMENT, NEPRO CARB STEADY,) LIQD Take 237 mLs by mouth 2 (two) times daily between meals. 03/26/16   Richarda Overlie, MD  oxyCODONE-acetaminophen (PERCOCET/ROXICET) 5-325 MG tablet Take 1 tablet by mouth every 6 (six) hours as needed. Pain 10/29/15   Historical Provider, MD  RENVELA 800 MG tablet  03/20/16   Historical Provider, MD    Family History Family History  Problem Relation Age of Onset  . Hypertension Father   . Kidney disease Father   . Allergies Father   . Deep vein thrombosis Sister   . Pulmonary embolism Sister   . Diabetes Paternal Grandmother     Social History Social History  Substance Use Topics  . Smoking status: Former Smoker    Packs/day: 0.25    Years:  0.50    Types: Cigarettes    Quit date: 09/08/1972  . Smokeless tobacco: Never Used  . Alcohol use No     Comment: 05/03/2013 "haven't had a glass of wine in ~ 3 months or so; sometimes will have one w/dinner"     Allergies   Pork-derived products   Review of Systems Review of Systems  Gastrointestinal: Positive for abdominal pain.  All other systems reviewed and are negative.    Physical Exam Updated Vital Signs BP 125/92 (BP Location: Right Arm)   Pulse 78   Temp 98.6 F (37 C)  Ht 6\' 1"  (1.854 m)   Wt 204 lb (92.5 kg)   SpO2 100%   BMI 26.91 kg/m   Physical Exam  Constitutional: He is oriented to person, place, and time. He appears well-developed and well-nourished. No distress.  HENT:  Head: Normocephalic and atraumatic.  Mouth/Throat: Oropharynx is clear and moist.  Neck: Normal range of motion. Neck supple.  Cardiovascular: Normal rate and regular rhythm.  Exam reveals no friction rub.   No murmur heard. Pulmonary/Chest: Effort normal and breath sounds normal. No respiratory distress. He has no wheezes. He has no rales.  Abdominal: Soft. Bowel sounds are normal. He exhibits distension. There is tenderness.  There is abdominal distention with generalized tenderness. There is no rebound or guarding. There is a palpable fluid wave.  Musculoskeletal: Normal range of motion. He exhibits no edema.  Neurological: He is alert and oriented to person, place, and time. Coordination normal.  Skin: Skin is warm and dry. He is not diaphoretic.  Nursing note and vitals reviewed.    ED Treatments / Results  Labs (all labs ordered are listed, but only abnormal results are displayed) Labs Reviewed - No data to display  EKG  EKG Interpretation None       Radiology Dg Chest 2 View  Result Date: 09/18/2016 CLINICAL DATA:  Abdominal and chest pain EXAM: CHEST  2 VIEW COMPARISON:  Chest radiograph 03/23/2016 FINDINGS: There is massive cardiomegaly, unchanged, with  calcification within the aortic arch. There are bilateral interstitial opacities likely indicating mild interstitial edema. No pneumothorax or pleural effusion. No focal airspace consolidation. IMPRESSION: 1. Massive cardiomegaly and aortic atherosclerosis. 2. Mild interstitial pulmonary edema. Electronically Signed   By: Deatra Robinson M.D.   On: 09/18/2016 23:52    Procedures Procedures (including critical care time)  Medications Ordered in ED Medications - No data to display   Initial Impression / Assessment and Plan / ED Course  I have reviewed the triage vital signs and the nursing notes.  Pertinent labs & imaging results that were available during my care of the patient were reviewed by me and considered in my medical decision making (see chart for details).  Clinical Course     I spoke with Dr. Lowella Dandy from radiology who will see to it that the patient undergoes ultrasound guided paracentesis this morning. The patient will remain in the emergency department awaiting this procedure.  Final Clinical Impressions(s) / ED Diagnoses   Final diagnoses:  Ascites  Abdominal pain    New Prescriptions New Prescriptions   No medications on file     Geoffery Lyons, MD 09/19/16 [phone]

## 2016-09-19 NOTE — ED Notes (Signed)
Pt is alert and oriented x 4 pt denies n/v/d. Pt has 10/10 abd pain, described as tightness, pressure.   with gross ascites.

## 2016-09-20 DIAGNOSIS — N2581 Secondary hyperparathyroidism of renal origin: Secondary | ICD-10-CM | POA: Diagnosis not present

## 2016-09-20 DIAGNOSIS — R509 Fever, unspecified: Secondary | ICD-10-CM | POA: Diagnosis not present

## 2016-09-20 DIAGNOSIS — N186 End stage renal disease: Secondary | ICD-10-CM | POA: Diagnosis not present

## 2016-09-20 DIAGNOSIS — D631 Anemia in chronic kidney disease: Secondary | ICD-10-CM | POA: Diagnosis not present

## 2016-09-20 LAB — ACID FAST SMEAR (AFB, MYCOBACTERIA): Acid Fast Smear: NEGATIVE

## 2016-09-20 LAB — TRIGLYCERIDES, BODY FLUIDS: Triglycerides, Fluid: 20 mg/dL

## 2016-09-20 LAB — TOTAL BILIRUBIN, BODY FLUID

## 2016-09-21 LAB — PH, BODY FLUID: PH, BODY FLUID: 7.8

## 2016-09-21 LAB — AMYLASE, PERITONEAL FLUID: Amylase, peritoneal fluid: 25 U/L

## 2016-09-22 ENCOUNTER — Encounter (HOSPITAL_COMMUNITY): Payer: Self-pay | Admitting: Emergency Medicine

## 2016-09-22 DIAGNOSIS — R509 Fever, unspecified: Secondary | ICD-10-CM | POA: Diagnosis not present

## 2016-09-22 DIAGNOSIS — N2581 Secondary hyperparathyroidism of renal origin: Secondary | ICD-10-CM | POA: Diagnosis not present

## 2016-09-22 DIAGNOSIS — N186 End stage renal disease: Secondary | ICD-10-CM | POA: Diagnosis not present

## 2016-09-22 DIAGNOSIS — D631 Anemia in chronic kidney disease: Secondary | ICD-10-CM | POA: Diagnosis not present

## 2016-09-23 ENCOUNTER — Other Ambulatory Visit: Payer: Self-pay | Admitting: Internal Medicine

## 2016-09-23 DIAGNOSIS — N63 Unspecified lump in unspecified breast: Secondary | ICD-10-CM

## 2016-09-23 LAB — ANAEROBIC CULTURE

## 2016-09-23 LAB — BODY FLUID CULTURE: Culture: NO GROWTH

## 2016-09-24 ENCOUNTER — Encounter (HOSPITAL_COMMUNITY): Payer: Self-pay | Admitting: *Deleted

## 2016-09-24 ENCOUNTER — Inpatient Hospital Stay (HOSPITAL_COMMUNITY)
Admission: EM | Admit: 2016-09-24 | Discharge: 2016-10-09 | DRG: 853 | Disposition: E | Payer: Medicare Other | Attending: Pulmonary Disease | Admitting: Pulmonary Disease

## 2016-09-24 ENCOUNTER — Emergency Department (HOSPITAL_COMMUNITY): Payer: Medicare Other

## 2016-09-24 DIAGNOSIS — N179 Acute kidney failure, unspecified: Secondary | ICD-10-CM | POA: Diagnosis not present

## 2016-09-24 DIAGNOSIS — J9600 Acute respiratory failure, unspecified whether with hypoxia or hypercapnia: Secondary | ICD-10-CM

## 2016-09-24 DIAGNOSIS — J939 Pneumothorax, unspecified: Secondary | ICD-10-CM | POA: Diagnosis not present

## 2016-09-24 DIAGNOSIS — I509 Heart failure, unspecified: Secondary | ICD-10-CM | POA: Diagnosis not present

## 2016-09-24 DIAGNOSIS — I4891 Unspecified atrial fibrillation: Secondary | ICD-10-CM

## 2016-09-24 DIAGNOSIS — Y95 Nosocomial condition: Secondary | ICD-10-CM | POA: Diagnosis present

## 2016-09-24 DIAGNOSIS — K7460 Unspecified cirrhosis of liver: Secondary | ICD-10-CM | POA: Diagnosis present

## 2016-09-24 DIAGNOSIS — N2581 Secondary hyperparathyroidism of renal origin: Secondary | ICD-10-CM | POA: Diagnosis not present

## 2016-09-24 DIAGNOSIS — G9340 Encephalopathy, unspecified: Secondary | ICD-10-CM | POA: Diagnosis not present

## 2016-09-24 DIAGNOSIS — J811 Chronic pulmonary edema: Secondary | ICD-10-CM | POA: Diagnosis not present

## 2016-09-24 DIAGNOSIS — Z833 Family history of diabetes mellitus: Secondary | ICD-10-CM

## 2016-09-24 DIAGNOSIS — Z515 Encounter for palliative care: Secondary | ICD-10-CM | POA: Diagnosis not present

## 2016-09-24 DIAGNOSIS — R918 Other nonspecific abnormal finding of lung field: Secondary | ICD-10-CM | POA: Diagnosis not present

## 2016-09-24 DIAGNOSIS — D6959 Other secondary thrombocytopenia: Secondary | ICD-10-CM | POA: Diagnosis present

## 2016-09-24 DIAGNOSIS — Z9911 Dependence on respirator [ventilator] status: Secondary | ICD-10-CM | POA: Diagnosis not present

## 2016-09-24 DIAGNOSIS — D509 Iron deficiency anemia, unspecified: Secondary | ICD-10-CM | POA: Diagnosis present

## 2016-09-24 DIAGNOSIS — J9601 Acute respiratory failure with hypoxia: Secondary | ICD-10-CM | POA: Diagnosis not present

## 2016-09-24 DIAGNOSIS — R4182 Altered mental status, unspecified: Secondary | ICD-10-CM | POA: Diagnosis not present

## 2016-09-24 DIAGNOSIS — Z841 Family history of disorders of kidney and ureter: Secondary | ICD-10-CM | POA: Diagnosis not present

## 2016-09-24 DIAGNOSIS — R162 Hepatomegaly with splenomegaly, not elsewhere classified: Secondary | ICD-10-CM | POA: Diagnosis present

## 2016-09-24 DIAGNOSIS — R188 Other ascites: Secondary | ICD-10-CM | POA: Diagnosis not present

## 2016-09-24 DIAGNOSIS — Z79899 Other long term (current) drug therapy: Secondary | ICD-10-CM

## 2016-09-24 DIAGNOSIS — I248 Other forms of acute ischemic heart disease: Secondary | ICD-10-CM | POA: Diagnosis present

## 2016-09-24 DIAGNOSIS — R402441 Other coma, without documented Glasgow coma scale score, or with partial score reported, in the field [EMT or ambulance]: Secondary | ICD-10-CM | POA: Diagnosis not present

## 2016-09-24 DIAGNOSIS — I481 Persistent atrial fibrillation: Secondary | ICD-10-CM | POA: Diagnosis present

## 2016-09-24 DIAGNOSIS — I5043 Acute on chronic combined systolic (congestive) and diastolic (congestive) heart failure: Secondary | ICD-10-CM | POA: Diagnosis not present

## 2016-09-24 DIAGNOSIS — J984 Other disorders of lung: Secondary | ICD-10-CM | POA: Diagnosis not present

## 2016-09-24 DIAGNOSIS — Z7982 Long term (current) use of aspirin: Secondary | ICD-10-CM

## 2016-09-24 DIAGNOSIS — I739 Peripheral vascular disease, unspecified: Secondary | ICD-10-CM | POA: Diagnosis present

## 2016-09-24 DIAGNOSIS — Z7951 Long term (current) use of inhaled steroids: Secondary | ICD-10-CM

## 2016-09-24 DIAGNOSIS — G4733 Obstructive sleep apnea (adult) (pediatric): Secondary | ICD-10-CM | POA: Diagnosis present

## 2016-09-24 DIAGNOSIS — R05 Cough: Secondary | ICD-10-CM | POA: Diagnosis not present

## 2016-09-24 DIAGNOSIS — E875 Hyperkalemia: Secondary | ICD-10-CM | POA: Diagnosis not present

## 2016-09-24 DIAGNOSIS — J1289 Other viral pneumonia: Secondary | ICD-10-CM | POA: Diagnosis not present

## 2016-09-24 DIAGNOSIS — J80 Acute respiratory distress syndrome: Secondary | ICD-10-CM

## 2016-09-24 DIAGNOSIS — J129 Viral pneumonia, unspecified: Secondary | ICD-10-CM | POA: Diagnosis present

## 2016-09-24 DIAGNOSIS — K219 Gastro-esophageal reflux disease without esophagitis: Secondary | ICD-10-CM | POA: Diagnosis present

## 2016-09-24 DIAGNOSIS — I2699 Other pulmonary embolism without acute cor pulmonale: Secondary | ICD-10-CM | POA: Diagnosis present

## 2016-09-24 DIAGNOSIS — R410 Disorientation, unspecified: Secondary | ICD-10-CM | POA: Diagnosis not present

## 2016-09-24 DIAGNOSIS — I2510 Atherosclerotic heart disease of native coronary artery without angina pectoris: Secondary | ICD-10-CM | POA: Diagnosis present

## 2016-09-24 DIAGNOSIS — Z4659 Encounter for fitting and adjustment of other gastrointestinal appliance and device: Secondary | ICD-10-CM

## 2016-09-24 DIAGNOSIS — B9729 Other coronavirus as the cause of diseases classified elsewhere: Secondary | ICD-10-CM | POA: Diagnosis present

## 2016-09-24 DIAGNOSIS — R6520 Severe sepsis without septic shock: Secondary | ICD-10-CM | POA: Diagnosis not present

## 2016-09-24 DIAGNOSIS — R6521 Severe sepsis with septic shock: Secondary | ICD-10-CM | POA: Diagnosis present

## 2016-09-24 DIAGNOSIS — Z452 Encounter for adjustment and management of vascular access device: Secondary | ICD-10-CM | POA: Diagnosis not present

## 2016-09-24 DIAGNOSIS — G9341 Metabolic encephalopathy: Secondary | ICD-10-CM | POA: Diagnosis present

## 2016-09-24 DIAGNOSIS — I82409 Acute embolism and thrombosis of unspecified deep veins of unspecified lower extremity: Secondary | ICD-10-CM | POA: Diagnosis present

## 2016-09-24 DIAGNOSIS — J189 Pneumonia, unspecified organism: Secondary | ICD-10-CM | POA: Diagnosis not present

## 2016-09-24 DIAGNOSIS — E162 Hypoglycemia, unspecified: Secondary | ICD-10-CM | POA: Diagnosis not present

## 2016-09-24 DIAGNOSIS — I132 Hypertensive heart and chronic kidney disease with heart failure and with stage 5 chronic kidney disease, or end stage renal disease: Secondary | ICD-10-CM | POA: Diagnosis present

## 2016-09-24 DIAGNOSIS — I420 Dilated cardiomyopathy: Secondary | ICD-10-CM | POA: Diagnosis present

## 2016-09-24 DIAGNOSIS — Z8249 Family history of ischemic heart disease and other diseases of the circulatory system: Secondary | ICD-10-CM

## 2016-09-24 DIAGNOSIS — Z87891 Personal history of nicotine dependence: Secondary | ICD-10-CM

## 2016-09-24 DIAGNOSIS — I5042 Chronic combined systolic (congestive) and diastolic (congestive) heart failure: Secondary | ICD-10-CM | POA: Diagnosis present

## 2016-09-24 DIAGNOSIS — Z86718 Personal history of other venous thrombosis and embolism: Secondary | ICD-10-CM

## 2016-09-24 DIAGNOSIS — I252 Old myocardial infarction: Secondary | ICD-10-CM

## 2016-09-24 DIAGNOSIS — N186 End stage renal disease: Secondary | ICD-10-CM | POA: Diagnosis not present

## 2016-09-24 DIAGNOSIS — R7989 Other specified abnormal findings of blood chemistry: Secondary | ICD-10-CM | POA: Diagnosis present

## 2016-09-24 DIAGNOSIS — I10 Essential (primary) hypertension: Secondary | ICD-10-CM | POA: Diagnosis present

## 2016-09-24 DIAGNOSIS — Z992 Dependence on renal dialysis: Secondary | ICD-10-CM

## 2016-09-24 DIAGNOSIS — I428 Other cardiomyopathies: Secondary | ICD-10-CM | POA: Diagnosis not present

## 2016-09-24 DIAGNOSIS — R778 Other specified abnormalities of plasma proteins: Secondary | ICD-10-CM | POA: Diagnosis present

## 2016-09-24 DIAGNOSIS — Z66 Do not resuscitate: Secondary | ICD-10-CM | POA: Diagnosis not present

## 2016-09-24 DIAGNOSIS — A419 Sepsis, unspecified organism: Principal | ICD-10-CM | POA: Diagnosis present

## 2016-09-24 DIAGNOSIS — Z86711 Personal history of pulmonary embolism: Secondary | ICD-10-CM | POA: Diagnosis not present

## 2016-09-24 DIAGNOSIS — R0602 Shortness of breath: Secondary | ICD-10-CM

## 2016-09-24 DIAGNOSIS — E43 Unspecified severe protein-calorie malnutrition: Secondary | ICD-10-CM | POA: Diagnosis present

## 2016-09-24 DIAGNOSIS — J90 Pleural effusion, not elsewhere classified: Secondary | ICD-10-CM | POA: Diagnosis not present

## 2016-09-24 DIAGNOSIS — R748 Abnormal levels of other serum enzymes: Secondary | ICD-10-CM | POA: Diagnosis not present

## 2016-09-24 DIAGNOSIS — Z6825 Body mass index (BMI) 25.0-25.9, adult: Secondary | ICD-10-CM

## 2016-09-24 DIAGNOSIS — Z01818 Encounter for other preprocedural examination: Secondary | ICD-10-CM

## 2016-09-24 DIAGNOSIS — K819 Cholecystitis, unspecified: Secondary | ICD-10-CM

## 2016-09-24 DIAGNOSIS — I959 Hypotension, unspecified: Secondary | ICD-10-CM | POA: Diagnosis not present

## 2016-09-24 DIAGNOSIS — D631 Anemia in chronic kidney disease: Secondary | ICD-10-CM | POA: Diagnosis not present

## 2016-09-24 LAB — COMPREHENSIVE METABOLIC PANEL
ALT: 24 U/L (ref 17–63)
ALT: 25 U/L (ref 17–63)
AST: 74 U/L — ABNORMAL HIGH (ref 15–41)
AST: 67 U/L — AB (ref 15–41)
Albumin: 2.5 g/dL — ABNORMAL LOW (ref 3.5–5.0)
Albumin: 2.3 g/dL — ABNORMAL LOW (ref 3.5–5.0)
Alkaline Phosphatase: 141 U/L — ABNORMAL HIGH (ref 38–126)
Alkaline Phosphatase: 132 U/L — ABNORMAL HIGH (ref 38–126)
Anion gap: 19 — ABNORMAL HIGH (ref 5–15)
Anion gap: 17 — ABNORMAL HIGH (ref 5–15)
BUN: 73 mg/dL — ABNORMAL HIGH (ref 6–20)
BUN: 77 mg/dL — ABNORMAL HIGH (ref 6–20)
CHLORIDE: 98 mmol/L — AB (ref 101–111)
CO2: 21 mmol/L — ABNORMAL LOW (ref 22–32)
CO2: 21 mmol/L — AB (ref 22–32)
Calcium: 9.3 mg/dL (ref 8.9–10.3)
Calcium: 9.2 mg/dL (ref 8.9–10.3)
Chloride: 96 mmol/L — ABNORMAL LOW (ref 101–111)
Creatinine, Ser: 8.82 mg/dL — ABNORMAL HIGH (ref 0.61–1.24)
Creatinine, Ser: 7.42 mg/dL — ABNORMAL HIGH (ref 0.61–1.24)
GFR calc Af Amer: 7 mL/min — ABNORMAL LOW (ref >60)
GFR calc non Af Amer: 6 mL/min — ABNORMAL LOW (ref >60)
GFR, EST AFRICAN AMERICAN: 8 mL/min — AB (ref >60)
GFR, EST NON AFRICAN AMERICAN: 7 mL/min — AB (ref >60)
Glucose, Bld: 52 mg/dL — ABNORMAL LOW (ref 65–99)
Glucose, Bld: 42 mg/dL — CL (ref 65–99)
POTASSIUM: 5.2 mmol/L — AB (ref 3.5–5.1)
Potassium: 5.6 mmol/L — ABNORMAL HIGH (ref 3.5–5.1)
SODIUM: 136 mmol/L (ref 135–145)
Sodium: 136 mmol/L (ref 135–145)
Total Bilirubin: 6 mg/dL — ABNORMAL HIGH (ref 0.3–1.2)
Total Bilirubin: 6.1 mg/dL — ABNORMAL HIGH (ref 0.3–1.2)
Total Protein: 7.8 g/dL (ref 6.5–8.1)
Total Protein: 7.2 g/dL (ref 6.5–8.1)

## 2016-09-24 LAB — CBC WITH DIFFERENTIAL/PLATELET
BASOS ABS: 0 10*3/uL (ref 0.0–0.1)
BASOS ABS: 0 10*3/uL (ref 0.0–0.1)
BASOS PCT: 0 %
Basophils Relative: 0 %
EOS ABS: 0 10*3/uL (ref 0.0–0.7)
EOS ABS: 0.1 10*3/uL (ref 0.0–0.7)
EOS PCT: 0 %
EOS PCT: 1 %
HCT: 36.7 % — ABNORMAL LOW (ref 39.0–52.0)
HCT: 35.6 % — ABNORMAL LOW (ref 39.0–52.0)
Hemoglobin: 12.5 g/dL — ABNORMAL LOW (ref 13.0–17.0)
Hemoglobin: 12.3 g/dL — ABNORMAL LOW (ref 13.0–17.0)
Lymphocytes Relative: 4 %
Lymphocytes Relative: 4 %
Lymphs Abs: 0.5 10*3/uL — ABNORMAL LOW (ref 0.7–4.0)
Lymphs Abs: 0.4 10*3/uL — ABNORMAL LOW (ref 0.7–4.0)
MCH: 27 pg (ref 26.0–34.0)
MCH: 27.2 pg (ref 26.0–34.0)
MCHC: 34.1 g/dL (ref 30.0–36.0)
MCHC: 34.6 g/dL (ref 30.0–36.0)
MCV: 79.3 fL (ref 78.0–100.0)
MCV: 78.8 fL (ref 78.0–100.0)
MONO ABS: 0.4 10*3/uL (ref 0.1–1.0)
MONO ABS: 0.5 10*3/uL (ref 0.1–1.0)
MONOS PCT: 3 %
Monocytes Relative: 5 %
Neutro Abs: 11.1 10*3/uL — ABNORMAL HIGH (ref 1.7–7.7)
Neutro Abs: 9.1 10*3/uL — ABNORMAL HIGH (ref 1.7–7.7)
Neutrophils Relative %: 93 %
Neutrophils Relative %: 91 %
PLATELETS: 83 10*3/uL — AB (ref 150–400)
PLATELETS: 85 10*3/uL — AB (ref 150–400)
RBC: 4.63 MIL/uL (ref 4.22–5.81)
RBC: 4.52 MIL/uL (ref 4.22–5.81)
RDW: 18 % — AB (ref 11.5–15.5)
RDW: 18 % — AB (ref 11.5–15.5)
WBC: 12 10*3/uL — ABNORMAL HIGH (ref 4.0–10.5)
WBC: 10.1 10*3/uL (ref 4.0–10.5)

## 2016-09-24 LAB — I-STAT VENOUS BLOOD GAS, ED
ACID-BASE EXCESS: 4 mmol/L — AB (ref 0.0–2.0)
Bicarbonate: 27.2 mmol/L (ref 20.0–28.0)
O2 SAT: 87 %
TCO2: 28 mmol/L (ref 0–100)
pCO2, Ven: 36.3 mmHg — ABNORMAL LOW (ref 44.0–60.0)
pH, Ven: 7.482 — ABNORMAL HIGH (ref 7.250–7.430)
pO2, Ven: 49 mmHg — ABNORMAL HIGH (ref 32.0–45.0)

## 2016-09-24 LAB — PROTIME-INR
INR: 1.53
Prothrombin Time: 18.6 seconds — ABNORMAL HIGH (ref 11.4–15.2)

## 2016-09-24 LAB — TROPONIN I
TROPONIN I: 0.3 ng/mL — AB (ref <0.03)
Troponin I: 0.25 ng/mL (ref <0.03)

## 2016-09-24 LAB — GLUCOSE, CAPILLARY
GLUCOSE-CAPILLARY: 43 mg/dL — AB (ref 65–99)
Glucose-Capillary: 73 mg/dL (ref 65–99)

## 2016-09-24 LAB — ETHANOL: Alcohol, Ethyl (B): <5 mg/dL (ref <5)

## 2016-09-24 LAB — I-STAT CG4 LACTIC ACID, ED: LACTIC ACID, VENOUS: 3.77 mmol/L — AB (ref 0.5–1.9)

## 2016-09-24 LAB — I-STAT TROPONIN, ED: TROPONIN I, POC: 0.28 ng/mL — AB (ref 0.00–0.08)

## 2016-09-24 LAB — ACETAMINOPHEN LEVEL: Acetaminophen (Tylenol), Serum: <10 ug/mL — ABNORMAL LOW (ref 10–30)

## 2016-09-24 LAB — APTT: aPTT: 46 seconds — ABNORMAL HIGH (ref 24–36)

## 2016-09-24 LAB — LACTIC ACID, PLASMA
LACTIC ACID, VENOUS: 2.6 mmol/L — AB (ref 0.5–1.9)
LACTIC ACID, VENOUS: 2.6 mmol/L — AB (ref 0.5–1.9)
Lactic Acid, Venous: 3.2 mmol/L (ref 0.5–1.9)

## 2016-09-24 LAB — PROCALCITONIN: PROCALCITONIN: 50.54 ng/mL

## 2016-09-24 LAB — INFLUENZA PANEL BY PCR (TYPE A & B)
INFLAPCR: NEGATIVE
INFLBPCR: NEGATIVE

## 2016-09-24 LAB — SALICYLATE LEVEL: Salicylate Lvl: <7 mg/dL (ref 2.8–30.0)

## 2016-09-24 MED ORDER — SODIUM CHLORIDE 0.9 % IV BOLUS (SEPSIS)
250.0000 mL | Freq: Once | INTRAVENOUS | Status: AC
  Administered 2016-09-24: 250 mL via INTRAVENOUS

## 2016-09-24 MED ORDER — ALUM & MAG HYDROXIDE-SIMETH 200-200-20 MG/5ML PO SUSP: 15.0000 mL | ORAL | Status: DC | PRN

## 2016-09-24 MED ORDER — SEVELAMER CARBONATE 800 MG PO TABS
800.0000 mg | ORAL_TABLET | Freq: Three times a day (TID) | ORAL | Status: DC
  Administered 2016-09-24 – 2016-09-26 (×3): 800 mg via ORAL
  Filled 2016-09-24 (×4): qty 1

## 2016-09-24 MED ORDER — ACETAMINOPHEN 500 MG PO TABS
1000.0000 mg | ORAL_TABLET | Freq: Once | ORAL | Status: AC
  Administered 2016-09-24: 1000 mg via ORAL
  Filled 2016-09-24: qty 2

## 2016-09-24 MED ORDER — VANCOMYCIN HCL 10 G IV SOLR: 2000.0000 mg | Freq: Once | INTRAVENOUS | Status: DC

## 2016-09-24 MED ORDER — IPRATROPIUM-ALBUTEROL 0.5-2.5 (3) MG/3ML IN SOLN: 3.0000 mL | RESPIRATORY_TRACT | Status: DC | PRN

## 2016-09-24 MED ORDER — SODIUM CHLORIDE 0.9 % IV SOLN
INTRAVENOUS | Status: DC
Start: 2016-09-24 — End: 2016-10-06
  Administered 2016-09-24 – 2016-10-03 (×2): via INTRAVENOUS

## 2016-09-24 MED ORDER — SODIUM CHLORIDE 0.9 % IV BOLUS (SEPSIS)
500.0000 mL | Freq: Once | INTRAVENOUS | Status: AC
  Administered 2016-09-24: 500 mL via INTRAVENOUS

## 2016-09-24 MED ORDER — VANCOMYCIN HCL 10 G IV SOLR
2000.0000 mg | Freq: Once | INTRAVENOUS | Status: AC
  Administered 2016-09-24: 2000 mg via INTRAVENOUS
  Filled 2016-09-24: qty 2000

## 2016-09-24 MED ORDER — DEXTROSE 50 % IV SOLN
1.0000 | INTRAVENOUS | Status: DC | PRN
  Administered 2016-09-25: 25 mL via INTRAVENOUS
  Administered 2016-09-25 – 2016-09-26 (×4): 50 mL via INTRAVENOUS
  Filled 2016-09-24 (×3): qty 50

## 2016-09-24 MED ORDER — PIPERACILLIN-TAZOBACTAM 3.375 G IVPB 30 MIN: 3.3750 g | Freq: Once | INTRAVENOUS | Status: DC

## 2016-09-24 MED ORDER — VANCOMYCIN HCL IN DEXTROSE 1-5 GM/200ML-% IV SOLN: 1000.0000 mg | Freq: Once | INTRAVENOUS | Status: DC

## 2016-09-24 MED ORDER — DEXTROSE 5 % IV SOLN: 500.0000 mg | INTRAVENOUS | Status: DC

## 2016-09-24 MED ORDER — PIPERACILLIN-TAZOBACTAM 3.375 G IVPB 30 MIN
3.3750 g | Freq: Once | INTRAVENOUS | Status: AC
  Administered 2016-09-24: 3.375 g via INTRAVENOUS
  Filled 2016-09-24: qty 50

## 2016-09-24 MED ORDER — DEXTROSE 5 % IV SOLN: 1.0000 g | INTRAVENOUS | Status: DC

## 2016-09-24 MED ORDER — PIPERACILLIN-TAZOBACTAM 3.375 G IVPB
3.3750 g | Freq: Two times a day (BID) | INTRAVENOUS | Status: DC
  Administered 2016-09-24 – 2016-09-27 (×7): 3.375 g via INTRAVENOUS
  Filled 2016-09-24 (×9): qty 50

## 2016-09-24 MED ORDER — SODIUM CHLORIDE 0.9 % IV BOLUS (SEPSIS)
500.0000 mL | Freq: Once | INTRAVENOUS | Status: AC
Start: 2016-09-24 — End: 2016-09-25
  Administered 2016-09-24: 500 mL via INTRAVENOUS

## 2016-09-24 NOTE — Progress Notes (Signed)
Critical lab called to [facility]   Lactic acid 2.4 Glucose 42 Troponin 0.30

## 2016-09-24 NOTE — Progress Notes (Signed)
Pharmacy Antibiotic Note  Trevor [name] is a 66 y.o. male admitted on 10-16-2016 with pneumonia and sepsis.  Patient has ESRD on HD MWF - due today  Plan: Vancomycin 2 g x 1 Zosyn 3.375 gm iv q12h Monitor HD schedule, cx, levels prn F/U nephro plans for HD today - may need  additional vanc     No data recorded.   Recent Labs Lab 09/18/16 1652  WBC 3.0*  CREATININE 6.50*    Estimated Creatinine Clearance: 13.1 mL/min (by C-G formula based on SCr of 6.5 mg/dL (H)).    Allergies  Allergen Reactions  . Pork-Derived Products Other (See Comments)    No pork products for religious reasons   Isaac Bliss, PharmD, BCPS, BCCCP Clinical Pharmacist Clinical phone for October 16, 2016 from 7a-3:30p: [phone] If after 3:30p, please call main pharmacy at: [phone] 10/16/2016 10:59 AM

## 2016-09-24 NOTE — ED Notes (Signed)
Pt sitting at end of bed with movement of both arms.

## 2016-09-24 NOTE — ED Notes (Signed)
Pt makes frequent vocalizations and grunting. Moves self in bed.

## 2016-09-24 NOTE — Progress Notes (Signed)
CRITICAL VALUE ALERT  Critical value received:  Lactic Acid 3.2  Date of notification:  09/25/2016  Time of notification:  2305  Critical value read back:Yes.    Nurse who received alert:  Kristeen Miss RN  MD notified (1st page):  Donnamarie Poag NP  Time of first page:  2312  MD notified (2nd page):  Time of second page:  Responding MD:  Donnamarie Poag NP  Time MD responded:  [phone]

## 2016-09-24 NOTE — ED Notes (Signed)
ED Provider at bedside. 

## 2016-09-24 NOTE — ED Notes (Signed)
[place]. PA at bedside

## 2016-09-24 NOTE — ED Triage Notes (Signed)
Per family pt became altered when he woke at 930. Pt was LSN at 4am. Pt is acting "disagreeable". Pt is due for dialysis this morning. No neuro deficits reported by EMS. Pt was ambulatory on scene. Reported for for several weeks with productive cough with yellow sputum started Sunday with fever and "broke yesterday evening"

## 2016-09-24 NOTE — ED Notes (Signed)
Attempted report x1. 

## 2016-09-24 NOTE — Plan of Care (Signed)
Problem: Education: Goal: Knowledge of [facility] Education information/materials will improve Outcome: Progressing Patient admitted to 4E and oriented to the room. Discussed vaccines and general education materials

## 2016-09-24 NOTE — ED Provider Notes (Addendum)
[facility]-EMERGENCY DEPT Provider Note   CSN: [id number] Arrival date & time: 09/16/2016 1033     History    Chief Complaint  Patient presents with  . Altered Mental Status     HPI Trevor [name] is a 66 y.o. male.  65yo M w/ extensive PMH including A fib, CAD, CHF, ESRD on HD, ascites who p/w confusion. Wife states that the patient was normal at 4 AM. When he woke up at 9:30 AM, she noticed that he was confused and disoriented. He has had several weeks of cough which became productive of yellow phlegm for 3 days ago. He has had intermittent fever up to 103 at home. He also reports generalized abdominal pain. He did have a therapeutic paracentesis several days ago. He endorses shortness of breath. He denies chest pain. Wife has been sick with a cold recently.   Past Medical History:  Diagnosis Date  . Acute on chronic systolic right heart failure (HCC) 11/14/2015  . Anemia   . Atrial fibrillation (HCC)    not on coumadin due to large spontaneous hematoma on back and anemia  . Atrial flutter (HCC) 04/27/2013  . AVF (arteriovenous fistula) (HCC)    Left  . CHF (congestive heart failure) (HCC)    EF 20-25%  . Coronary artery disease   . Diverticulosis   . Dysrhythmia    afib  . Elevated troponin 11/14/2015  . ESRD (end stage renal disease) on dialysis (HCC)    [facility] mon/wed/fri  . Exertional shortness of breath    "related to infection in my lungs right now" (05/03/2013)  . GERD (gastroesophageal reflux disease)   . Hepatosplenomegaly 10/18/2015   Workup per Dr Kaplan/ GI in Dec 2015 > abd pain improved after paracentesis x 2 (1.2L, 1.3L). Had negative hepA/ hep B/ hep C testing. For hepatosplenomagealy pt underwent transjugular liver biopsy by IR with hepatic vein wedge pressure measurement which showed elevated right heart and free hepatic venous pressures likely due to right heart failure.  There was no evidence of portal hypertension. Liver bx originally was reported as "End  stage liver disease/ cirrhosis", then was corrected > "No evidence of cirrhosis".  The corrected liver biopsy result reads "SINUSOIDAL DILATATION WITH SCATTERED FOCI OF HEPATITIS".  Last GI visit was Aug 2016 for bloating/ abd pain, noted he had a normal gastric empty study.  -Right lobe liver lesion. Felt to be hemangioma, Biopsy pending.  -thrombocytopenia. Dates back to 08/2013.  -ESRD. MWF HD.  -biventricular heart failure, acute right sided heart failure. Daily HD to address volume overload.    Marland Kitchen Hereditary and idiopathic peripheral neuropathy 08/29/2015  . History of gout    "before I started doing the dialysis" (05/03/2013)  . Hypertension   . Hypertensive urgency    H/o  . Hypovitaminosis D   . Myocardial infarction 90's  . Peripheral vascular disease (HCC)    dvt leg 12/14  . Pulmonary embolism (HCC)    with right DVT secondary to recent surgery  . Rectal bleeding 11/2015  . Renal insufficiency   . S/P repair of ventral hernia 03/21/2014  . Secondary hyperparathyroidism (HCC)   . Sleep apnea   . Syncope    felt secondary to residual anesthesia the day before - 2D echo unremarkable     Patient Active Problem List   Diagnosis Date Noted  . Pruritus 04/15/2016  . Chronic rhinitis 04/15/2016  . Hypoglycemia 03/24/2016  . Hematochezia   . Chronic RUQ pain   .  Gastritis   . Rectal bleeding 11/28/2015  . GI bleed 11/28/2015  . NICM (nonischemic cardiomyopathy) (HCC)   . Syncope and collapse   . Chest pain 11/14/2015  . Acute on chronic systolic right heart failure (HCC) 11/14/2015  . Elevated troponin 11/14/2015  . End-stage renal disease on hemodialysis (HCC)   . Syncope 10/30/2015  . Chronic combined systolic and diastolic heart failure (HCC) 10/30/2015  . Hepatosplenomegaly 10/18/2015  . Intractable abdominal pain 10/14/2015  . Hereditary and idiopathic peripheral neuropathy 08/29/2015  . Bloating 04/19/2015  . Hepatic cirrhosis (HCC) 04/19/2015  . Palliative care  encounter 09/09/2014  . Abdominal distension   . Acute on chronic combined systolic and diastolic heart failure (HCC)   . RVF (right ventricular failure) (HCC)   . Chronic atrial fibrillation (HCC)   . Abdominal pain, chronic, epigastric 08/31/2014  . Ascites 08/30/2014  . Cough syncope 08/30/2014  . Pancytopenia (HCC) 08/30/2014  . DCM- EF 25-30% by echo 09/04/14 03/01/2014  . Coronary artery calcification seen on CAT scan 03/01/2014  . Mechanical complication of other vascular device, implant, and graft 11/24/2013  . Cellulitis and abscess of leg 08/15/2013  . Pulmonary embolism (HCC)   . DVT of lower extremity (deep venous thrombosis) RLE 06/29/2013  . GERD (gastroesophageal reflux disease)   . Cough 05/17/2013  . ESRD on hemodialysis (HCC) 05/03/2013  . Obstructive sleep apnea 05/02/2010  . DISORDERS OF PHOSPHORUS METABOLISM 02/24/2008  . MYALGIA 02/23/2008  . UNSPECIFIED VITAMIN DEFICIENCY 12/22/2007  . ANEMIA, IRON DEFICIENCY 12/22/2007  . Essential hypertension 12/22/2007  . Secondary renal hyperparathyroidism (HCC) 12/22/2007    Past Surgical History:  Procedure Laterality Date  . AV FISTULA PLACEMENT Left    Dr. Charlean Sanfilippo; "I've had 2 on the left' (05/03/2013)  . AV FISTULA PLACEMENT Right ~ 2011  . AVGG REMOVAL Right 05/04/2013   Procedure: REMOVAL OF ARTERIOVENOUS Fistula Right Arm;  Surgeon: [name], MD;  Location: [facility] OR;  Service: Vascular;  Laterality: Right;  . BASCILIC VEIN TRANSPOSITION Left 06/27/2013   Procedure: BASCILIC VEIN TRANSPOSITION;  Surgeon: [name], MD;  Location: [facility] OR;  Service: Vascular;  Laterality: Left;  . CARDIAC CATHETERIZATION N/A 11/15/2015   Procedure: Right Heart Cath;  Surgeon: Lennette Bihari, MD;  Location: [facility] INVASIVE CV LAB;  Service: Cardiovascular;  Laterality: N/A;  . CARDIAC CATHETERIZATION Right 11/15/2015   Procedure: Left Heart Cath;  Surgeon: Lennette Bihari, MD;  Location: [facility] INVASIVE CV LAB;  Service:  Cardiovascular;  Laterality: Right;  . CARDIOVERSION N/A 08/29/2013   Procedure: CARDIOVERSION;  Surgeon: [name], MD;  Location: [facility] ENDOSCOPY;  Service: Cardiovascular;  Laterality: N/A;  . COLONOSCOPY  10-29-2005   Hx: of  . COLONOSCOPY N/A 11/30/2015   Procedure: COLONOSCOPY;  Surgeon: Meryl Dare, MD;  Location: [facility] ENDOSCOPY;  Service: Endoscopy;  Laterality: N/A;  . ESOPHAGOGASTRODUODENOSCOPY N/A 11/30/2015   Procedure: ESOPHAGOGASTRODUODENOSCOPY (EGD);  Surgeon: Meryl Dare, MD;  Location: [facility] ENDOSCOPY;  Service: Endoscopy;  Laterality: N/A;  . HERNIA REPAIR  03/21/14   Umbilical hernia-Dr. Derrell Lolling  . INSERTION OF DIALYSIS CATHETER Right 05/04/2013   Procedure: INSERTION OF DIALYSIS CATHETER;  Surgeon: [name], MD;  Location: [facility] OR;  Service: Vascular;  Laterality: Right;  . INSERTION OF MESH N/A 03/21/2014   Procedure: INSERTION OF MESH;  Surgeon: Axel Filler, MD;  Location: [facility] OR;  Service: General;  Laterality: N/A;  . KNEE ARTHROSCOPY Left   . REMOVAL OF A DIALYSIS CATHETER  2/15  . UMBILICAL HERNIA REPAIR N/A 03/21/2014   Procedure: LAPAROSCOPIC UMBILICAL HERNIA REPAIR WITH MESH;  Surgeon: Axel Filler, MD;  Location: [facility] OR;  Service: General;  Laterality: N/A;  . VENOGRAM Left 05/26/2013   Procedure: VENOGRAM;  Surgeon: Fransisco Hertz, MD;  Location: [facility] CATH LAB;  Service: Cardiovascular;  Laterality: Left;        Home Medications    Prior to Admission medications   Medication Sig Start Date End Date Taking? Authorizing Provider  albuterol (PROVENTIL HFA;VENTOLIN HFA) 108 (90 Base) MCG/ACT inhaler Inhale 2 puffs into the lungs every 4 (four) hours as needed for wheezing or shortness of breath.   Yes Historical Provider, MD  labetalol (NORMODYNE) 200 MG tablet Take 100 mg by mouth once a week.  12/24/15  Yes Historical Provider, MD  oxyCODONE-acetaminophen (PERCOCET/ROXICET) 5-325 MG tablet Take 1 tablet by mouth every 6 (six) hours as needed for severe  pain. Pain  10/29/15  Yes Historical Provider, MD  RENVELA 800 MG tablet Take by mouth 3 (three) times daily with meals. Take 1-4 tablets with each meal 03/20/16  Yes Historical Provider, MD  aspirin EC 325 MG EC tablet Take 1 tablet (325 mg total) by mouth daily. Patient not taking: Reported on 09/19/2016 03/26/16   Richarda Overlie, MD  cephALEXin (KEFLEX) 250 MG capsule Take 1 capsule (250 mg total) by mouth 2 (two) times daily. Patient not taking: Reported on 09/19/2016 06/22/16   Tobi Bastos, NP  fluticasone [facility]) 50 MCG/ACT nasal spray 1-2 Sprays per nostril daily as needed. Patient not taking: Reported on 09/19/2016 04/15/16   Cristal Ford, MD  hydrOXYzine (ATARAX/VISTARIL) 10 MG tablet Take 1 tablet (10 mg total) by mouth every 6 (six) hours as needed for itching. Patient not taking: Reported on 09/19/2016 03/26/16   Richarda Overlie, MD  nitroGLYCERIN (NITROSTAT) 0.4 MG SL tablet Place 1 tablet (0.4 mg total) under the tongue every 5 (five) minutes as needed for chest pain. Patient not taking: Reported on 10-13-16 11/17/15   Rodolph Bong, MD  Nutritional Supplements (FEEDING SUPPLEMENT, NEPRO CARB STEADY,) LIQD Take 237 mLs by mouth 2 (two) times daily between meals. Patient not taking: Reported on 09/19/2016 03/26/16   Richarda Overlie, MD      Family History  Problem Relation Age of Onset  . Hypertension Father   . Kidney disease Father   . Allergies Father   . Deep vein thrombosis Sister   . Pulmonary embolism Sister   . Diabetes Paternal Grandmother      Social History  Substance Use Topics  . Smoking status: Former Smoker    Packs/day: 0.25    Years: 0.50    Types: Cigarettes    Quit date: 09/08/1972  . Smokeless tobacco: Never Used  . Alcohol use No     Comment: 05/03/2013 "haven't had a glass of wine in ~ 3 months or so; sometimes will have one w/dinner"     Allergies     Pork-derived products    Review of Systems  10 Systems reviewed and are negative  for acute change except as noted in the HPI.   Physical Exam Updated Vital Signs BP (!) 86/65 (BP Location: Right Arm)   Pulse 103   Temp 100.5 F (38.1 C) (Oral)   Resp (!) 38   SpO2 (!) 86%   Physical Exam  Constitutional: He appears well-developed and well-nourished.  Uncomfortable, restless in bed, awake  HENT:  Head: Normocephalic and atraumatic.  Eyes:  Conjunctivae are normal. Pupils are equal, round, and reactive to light.  Neck: Neck supple.  Cardiovascular: Regular rhythm and normal heart sounds.  Tachycardia present.   No murmur heard. Pulmonary/Chest: Effort normal. No respiratory distress. He has no wheezes.  Coarse BS b/l, tachypnea, frequent cough  Abdominal: Soft. Bowel sounds are normal. He exhibits no distension. There is no tenderness.  Musculoskeletal: He exhibits no edema.  Neurological: He is alert.  Oriented to person and place, able to follow basic commands, moving all 4 ext  Skin: Skin is warm and dry.  Psychiatric:  Mildly agitated  Nursing note and vitals reviewed.     ED Treatments / Results  Labs (all labs ordered are listed, but only abnormal results are displayed) Labs Reviewed  COMPREHENSIVE METABOLIC PANEL - Abnormal; Notable for the following:       Result Value   Potassium 5.6 (*)    Chloride 96 (*)    CO2 21 (*)    Glucose, Bld 52 (*)    BUN 73 (*)    Creatinine, Ser 8.82 (*)    Albumin 2.5 (*)    AST 74 (*)    Alkaline Phosphatase 141 (*)    Total Bilirubin 6.0 (*)    GFR calc non Af Amer 6 (*)    GFR calc Af Amer 7 (*)    Anion gap 19 (*)    All other components within normal limits  ACETAMINOPHEN LEVEL - Abnormal; Notable for the following:    Acetaminophen (Tylenol), Serum <10 (*)    All other components within normal limits  CBC WITH DIFFERENTIAL/PLATELET - Abnormal; Notable for the following:    WBC 12.0 (*)    Hemoglobin 12.5 (*)    HCT 36.7 (*)    RDW 18.0 (*)    Platelets 83 (*)    Neutro Abs 11.1 (*)     Lymphs Abs 0.5 (*)    All other components within normal limits  I-STAT CG4 LACTIC ACID, ED - Abnormal; Notable for the following:    Lactic Acid, Venous 3.77 (*)    All other components within normal limits  I-STAT TROPOININ, ED - Abnormal; Notable for the following:    Troponin i, poc 0.28 (*)    All other components within normal limits  I-STAT VENOUS BLOOD GAS, ED - Abnormal; Notable for the following:    pH, Ven 7.482 (*)    pCO2, Ven 36.3 (*)    pO2, Ven 49.0 (*)    Acid-Base Excess 4.0 (*)    All other components within normal limits  CULTURE, BLOOD (ROUTINE X 2)  CULTURE, BLOOD (ROUTINE X 2)  ETHANOL  SALICYLATE LEVEL  INFLUENZA PANEL BY PCR (TYPE A & B)  LACTIC ACID, PLASMA     EKG  EKG Interpretation  Date/Time:  Wednesday September 24 2016 12:21:40 EST Ventricular Rate:  121 PR Interval:    QRS Duration: 107 QT Interval:  343 QTC Calculation: 487 R Axis:   -142 Text Interpretation:  Atrial fibrillation Probable right ventricular hypertrophy Borderline T abnormalities, anterior leads Borderline prolonged QT interval since previous tracing, rate faster Confirmed by Millisa Giarrusso MD, Torrie Lafavor [phone]) on 09/26/2016 12:44:09 PM         Radiology Ct Head Wo Contrast  Result Date: 10/03/2016 CLINICAL DATA:  Altered mental status EXAM: CT HEAD WITHOUT CONTRAST TECHNIQUE: Contiguous axial images were obtained from the base of the skull through the vertex without intravenous contrast. COMPARISON:  03/24/2016 FINDINGS: Brain: No evidence of acute  infarction, hemorrhage, hydrocephalus, extra-axial collection or mass lesion/mass effect. Periventricular white matter low attenuation as can be seen with microvascular disease. Generalized cerebral atrophy. Vascular: No hyperdense vessel or unexpected calcification. Skull: No osseous abnormality. Sinuses/Orbits: Mucous retention cyst in bilateral maxillary sinuses. Visualized mastoid sinuses are clear. Visualized orbits demonstrate no focal  abnormality. Other: None IMPRESSION: 1. No acute intracranial pathology. Electronically Signed   By: Elige Ko   On: 10/07/2016 14:22   Dg Chest Portable 1 View  Result Date: 09/21/2016 CLINICAL DATA:  Cough, congestion, pneumonia, sepsis EXAM: PORTABLE CHEST 1 VIEW COMPARISON:  09/18/2016 FINDINGS: Mild patchy right lower lobe opacity. No frank interstitial edema. No pleural effusion or pneumothorax. Cardiomegaly. IMPRESSION: Mild patchy right lower lobe opacity, atelectasis versus pneumonia. Electronically Signed   By: Charline Bills M.D.   On: 09/26/2016 14:51    Procedures Procedures (including critical care time) .Critical Care Performed by: Laurence Spates Authorized by: Laurence Spates   Critical care provider statement:    Critical care time (minutes):  45   Critical care was necessary to treat or prevent imminent or life-threatening deterioration of the following conditions:  Sepsis   Critical care was time spent personally by me on the following activities:  Development of treatment plan with patient or surrogate, evaluation of patient's response to treatment, examination of patient, obtaining history from patient or surrogate, ordering and performing treatments and interventions, ordering and review of laboratory studies, ordering and review of radiographic studies, re-evaluation of patient's condition, review of old charts and pulse oximetry    Medications Ordered in ED  Medications  piperacillin-tazobactam (ZOSYN) IVPB 3.375 g (not administered)  acetaminophen (TYLENOL) tablet 1,000 mg (not administered)  sodium chloride 0.9 % bolus 500 mL (0 mLs Intravenous Stopped 09/23/2016 1231)  piperacillin-tazobactam (ZOSYN) IVPB 3.375 g (0 g Intravenous Stopped 09/08/2016 1229)  vancomycin (VANCOCIN) 2,000 mg in sodium chloride 0.9 % 500 mL IVPB (0 mg Intravenous Stopped 10/03/2016 1426)  sodium chloride 0.9 % bolus 500 mL (0 mLs Intravenous Stopped 10/01/2016 1420)  sodium  chloride 0.9 % bolus 500 mL (500 mLs Intravenous New Bag/Given 10/03/2016 1420)     Initial Impression / Assessment and Plan / ED Course  I have reviewed the triage vital signs and the nursing notes.  Pertinent labs & imaging results that were available during my care of the patient were reviewed by me and considered in my medical decision making (see chart for details).  Clinical Course     Patient brought in for confusion noted by wife this morning. Has had several days of productive cough, shortness of breath and cold symptoms. He was mildly confused but able to follow commands on arrival. He was 86/65 HR 103 T 100.5. Frequent coughing and mild hypoxia requiring supplemental oxygen. Initiated a code sepsis with blood cultures, broad-spectrum antibiotics. Because he is due for dialysis today, I did slow his fluids to prevent worsening respiratory failure.  VBG reassuring, lactate 3.77, CBC shows WBC 12,000. K 5.6. EKG shows no hyperkalemic changes. Chest x-ray with possible right lower lobe pneumonia, which I suspect given his symptoms and fever. I considered SBP given his report of abdominal pain and recent therapeutic paracentesis, however given his cough and hypoxia I feel this is a less likely explanation for her sepsis. Ultrasound shows no large fluid collection for paracentesis today.   Sepsis - Repeat Assessment  Performed at:    ED  Vitals     Blood pressure 90/65, pulse (!) 122, temperature  100.5 F (38.1 C), temperature source Oral, resp. rate (!) 35, SpO2 94 %.  Heart:     Tachycardic  Lungs:    Rhonchi  Capillary Refill:   <2 sec  Peripheral Pulse:   Radial pulse palpable  Skin:     Normal Color   He has remained mildly hypotensive but MAPs >65. Discussed admission with tried hospitalist, Huntley Dec, and patient admitted to stepdown for further treatment.  Final Clinical Impressions(s) / ED Diagnoses   Final diagnoses:  Acute respiratory failure with hypoxia (HCC)    Sepsis, due to unspecified organism (HCC)  Altered mental status, unspecified altered mental status type     New Prescriptions   No medications on file       Laurence Spates, MD 10/11/16 1548    Laurence Spates, MD 2016/10/11 [phone]

## 2016-09-24 NOTE — H&P (Signed)
History and Physical    Hunner Garcon El WJX:[id number] DOB: 1950/09/29 DOA: 10/07/2016   PCP: [name], MD   Patient coming from:  Home  Chief Complaint: Shrotness of breath, fever, cough, initial confusion   HPI: Zyrion Coey is a 66 y.o. male with medical history significant for ESRD on [facility] ([name]), Afib, CHF, brought today after waking up aroung 930 with initial confusion (GCS 14), disoriented. Last known normal 4 am. He was noted to have developed productive phlegm over the last 3 days, shortness of breath, intermittent fever up top 103, and generalized myalgia. He denies any chest pain or palpitations but he does endorse tachycardia. His wife had been  with a "cold" recently. He had also been  had a paracentesis for his CHF on 1/12, all risk factors for  possible sick contacts.  He was noted to being mildly agitated on presentation. He O stats were in the 80s during admission, requiring 3 L of oxygen with improvement of his respiratory status as well as his agitation and mental status changes. He is currently interactive, and no confusion is noted. He reports being very thirsty, he denies any sore throat. He denies any headaches or vision changes. He denies any abdominal pain nausea or vomiting. He denies any red accumulation of fluid in his abdominal cavity. He does report chronic lower extremity swelling, especially on the left. He denies any bleeding issues.  ED Course:  BP (!) 78/62   Pulse 111   Temp 98.2 F (36.8 C) (Oral)   Resp (!) 36   Ht 6\' 1"  (1.854 m)   Wt 89.8 kg (197 lb 15.6 oz)   SpO2 (!) 88%   BMI 26.12 kg/m    sodium 136 potassium 5.6 glucose 52 creatinine 8.82 AST 74 ALT 24 total bilirubin 6.0 troponin 0.28 lactic acid 3.77 white count 12 hemoglobin 12.5 platelets 83 Systolic function was moderately to severely reduced. The estimated ejection fraction was in the range of 30% to 35%. Diffuse hypokinesis. He received 1. 500 mL normal  saline, Vanco and Zosyn.   Review of Systems: As per HPI otherwise 10 point review of systems negative.   Past Medical History:  Diagnosis Date  . Acute on chronic systolic right heart failure (HCC) 11/14/2015  . Anemia   . Atrial fibrillation (HCC)    not on coumadin due to large spontaneous hematoma on back and anemia  . Atrial flutter (HCC) 04/27/2013  . AVF (arteriovenous fistula) (HCC)    Left  . CHF (congestive heart failure) (HCC)    EF 20-25%  . Coronary artery disease   . Diverticulosis   . Dysrhythmia    afib  . Elevated troponin 11/14/2015  . ESRD (end stage renal disease) on dialysis (HCC)    [facility] mon/wed/fri  . Exertional shortness of breath    "related to infection in my lungs right now" (05/03/2013)  . GERD (gastroesophageal reflux disease)   . Hepatosplenomegaly 10/18/2015   Workup per Dr Kaplan/ GI in Dec 2015 > abd pain improved after paracentesis x 2 (1.2L, 1.3L). Had negative hepA/ hep B/ hep C testing. For hepatosplenomagealy pt underwent transjugular liver biopsy by IR with hepatic vein wedge pressure measurement which showed elevated right heart and free hepatic venous pressures likely due to right heart failure.  There was no evidence of portal hypertension. Liver bx originally was reported as "End stage liver disease/ cirrhosis", then was corrected > "No evidence of cirrhosis".  The corrected liver biopsy result reads "SINUSOIDAL DILATATION WITH SCATTERED FOCI OF HEPATITIS".  Last GI visit was Aug 2016 for bloating/ abd pain, noted he had a normal gastric empty study.  -Right lobe liver lesion. Felt to be hemangioma, Biopsy pending.  -thrombocytopenia. Dates back to 08/2013.  -ESRD. MWF HD.  -biventricular heart failure, acute right sided heart failure. Daily HD to address volume overload.    Marland Kitchen Hereditary and idiopathic peripheral neuropathy 08/29/2015  . History of gout    "before I started doing the dialysis" (05/03/2013)  . Hypertension   . Hypertensive  urgency    H/o  . Hypovitaminosis D   . Myocardial infarction 90's  . Peripheral vascular disease (HCC)    dvt leg 12/14  . Pulmonary embolism (HCC)    with right DVT secondary to recent surgery  . Rectal bleeding 11/2015  . Renal insufficiency   . S/P repair of ventral hernia 03/21/2014  . Secondary hyperparathyroidism (HCC)   . Sleep apnea   . Syncope    felt secondary to residual anesthesia the day before - 2D echo unremarkable    Past Surgical History:  Procedure Laterality Date  . AV FISTULA PLACEMENT Left    Dr. Charlean Sanfilippo; "I've had 2 on the left' (05/03/2013)  . AV FISTULA PLACEMENT Right ~ 2011  . AVGG REMOVAL Right 05/04/2013   Procedure: REMOVAL OF ARTERIOVENOUS Fistula Right Arm;  Surgeon: [name], MD;  Location: [facility] OR;  Service: Vascular;  Laterality: Right;  . BASCILIC VEIN TRANSPOSITION Left 06/27/2013   Procedure: BASCILIC VEIN TRANSPOSITION;  Surgeon: [name], MD;  Location: [facility] OR;  Service: Vascular;  Laterality: Left;  . CARDIAC CATHETERIZATION N/A 11/15/2015   Procedure: Right Heart Cath;  Surgeon: Lennette Bihari, MD;  Location: [facility] INVASIVE CV LAB;  Service: Cardiovascular;  Laterality: N/A;  . CARDIAC CATHETERIZATION Right 11/15/2015   Procedure: Left Heart Cath;  Surgeon: Lennette Bihari, MD;  Location: [facility] INVASIVE CV LAB;  Service: Cardiovascular;  Laterality: Right;  . CARDIOVERSION N/A 08/29/2013   Procedure: CARDIOVERSION;  Surgeon: [name], MD;  Location: [facility] ENDOSCOPY;  Service: Cardiovascular;  Laterality: N/A;  . COLONOSCOPY  10-29-2005   Hx: of  . COLONOSCOPY N/A 11/30/2015   Procedure: COLONOSCOPY;  Surgeon: Meryl Dare, MD;  Location: [facility] ENDOSCOPY;  Service: Endoscopy;  Laterality: N/A;  . ESOPHAGOGASTRODUODENOSCOPY N/A 11/30/2015   Procedure: ESOPHAGOGASTRODUODENOSCOPY (EGD);  Surgeon: Meryl Dare, MD;  Location: [facility] ENDOSCOPY;  Service: Endoscopy;  Laterality: N/A;  . HERNIA REPAIR  03/21/14   Umbilical hernia-Dr. Derrell Lolling  .  INSERTION OF DIALYSIS CATHETER Right 05/04/2013   Procedure: INSERTION OF DIALYSIS CATHETER;  Surgeon: [name], MD;  Location: [facility] OR;  Service: Vascular;  Laterality: Right;  . INSERTION OF MESH N/A 03/21/2014   Procedure: INSERTION OF MESH;  Surgeon: Axel Filler, MD;  Location: [facility] OR;  Service: General;  Laterality: N/A;  . KNEE ARTHROSCOPY Left   . REMOVAL OF A DIALYSIS CATHETER  2/15  . UMBILICAL HERNIA REPAIR N/A 03/21/2014   Procedure: LAPAROSCOPIC UMBILICAL HERNIA REPAIR WITH MESH;  Surgeon: Axel Filler, MD;  Location: [facility] OR;  Service: General;  Laterality: N/A;  . VENOGRAM Left 05/26/2013   Procedure: VENOGRAM;  Surgeon: Fransisco Hertz, MD;  Location: [facility] CATH LAB;  Service: Cardiovascular;  Laterality: Left;    Social History Social History   Social History  . Marital status: Significant Other    Spouse name: N/A  . Number  of children: 4  . Years of education: N/A   Occupational History  . Disabled    Social History Main Topics  . Smoking status: Former Smoker    Packs/day: 0.25    Years: 0.50    Types: Cigarettes    Quit date: 09/08/1972  . Smokeless tobacco: Never Used  . Alcohol use No     Comment: 05/03/2013 "haven't had a glass of wine in ~ 3 months or so; sometimes will have one w/dinner"  . Drug use: No  . Sexual activity: Yes    Birth control/ protection: None   Other Topics Concern  . Not on file   Social History Narrative   Former smoker- quit over 30 yrs ago   Patient is on disability     Allergies  Allergen Reactions  . Pork-Derived Products Other (See Comments)    No pork products for religious reasons    Family History  Problem Relation Age of Onset  . Hypertension Father   . Kidney disease Father   . Allergies Father   . Deep vein thrombosis Sister   . Pulmonary embolism Sister   . Diabetes Paternal Grandmother       Prior to Admission medications   Medication Sig Start Date End Date Taking? Authorizing Provider  albuterol  (PROVENTIL HFA;VENTOLIN HFA) 108 (90 Base) MCG/ACT inhaler Inhale 2 puffs into the lungs every 4 (four) hours as needed for wheezing or shortness of breath.   Yes Historical Provider, MD  labetalol (NORMODYNE) 200 MG tablet Take 100 mg by mouth once a week.  12/24/15  Yes Historical Provider, MD  oxyCODONE-acetaminophen (PERCOCET/ROXICET) 5-325 MG tablet Take 1 tablet by mouth every 6 (six) hours as needed for severe pain. Pain  10/29/15  Yes Historical Provider, MD  RENVELA 800 MG tablet Take by mouth 3 (three) times daily with meals. Take 1-4 tablets with each meal 03/20/16  Yes Historical Provider, MD  aspirin EC 325 MG EC tablet Take 1 tablet (325 mg total) by mouth daily. Patient not taking: Reported on 09/19/2016 03/26/16   Richarda Overlie, MD  cephALEXin (KEFLEX) 250 MG capsule Take 1 capsule (250 mg total) by mouth 2 (two) times daily. Patient not taking: Reported on 09/19/2016 06/22/16   Tobi Bastos, NP  fluticasone [facility]) 50 MCG/ACT nasal spray 1-2 Sprays per nostril daily as needed. Patient not taking: Reported on 09/19/2016 04/15/16   Cristal Ford, MD  hydrOXYzine (ATARAX/VISTARIL) 10 MG tablet Take 1 tablet (10 mg total) by mouth every 6 (six) hours as needed for itching. Patient not taking: Reported on 09/19/2016 03/26/16   Richarda Overlie, MD  nitroGLYCERIN (NITROSTAT) 0.4 MG SL tablet Place 1 tablet (0.4 mg total) under the tongue every 5 (five) minutes as needed for chest pain. Patient not taking: Reported on 08-Oct-2016 11/17/15   Rodolph Bong, MD  Nutritional Supplements (FEEDING SUPPLEMENT, NEPRO CARB STEADY,) LIQD Take 237 mLs by mouth 2 (two) times daily between meals. Patient not taking: Reported on 09/19/2016 03/26/16   Richarda Overlie, MD    Physical Exam:    Vitals:   08-Oct-2016 1545 2016-10-08 1600 10-08-2016 1630 October 08, 2016 1705  BP: 102/77 (!) 81/58 (!) 78/62   Pulse: 116 (!) 131 111   Resp: (!) 35 (!) 36 (!) 36   Temp:    98.2 F (36.8 C)  TempSrc:    Oral  SpO2: 92%  91% (!) 88%   Weight:    89.8 kg (197 lb 15.6 oz)  Height:  6\' 1"  (1.854 m)       Constitutional:ill appearing, uncomfortable  Vitals:   10/09/2016 1545 10-09-2016 1600 2016/10/09 1630 10-09-2016 1705  BP: 102/77 (!) 81/58 (!) 78/62   Pulse: 116 (!) 131 111   Resp: (!) 35 (!) 36 (!) 36   Temp:    98.2 F (36.8 C)  TempSrc:    Oral  SpO2: 92% 91% (!) 88%   Weight:    89.8 kg (197 lb 15.6 oz)  Height:    6\' 1"  (1.854 m)   Eyes: PERRL, lids and conjunctivae normal ENMT: Mucous membranes are moist. Posterior pharynx clear of any exudate or lesions. Normal dentition.  Neck: normal, supple, no masses, no thyromegaly Respiratory:coarse breath sounds bilaterally, tachypneic, frequent cough  Normal respiratory effort. No accessory muscle use.  Cardiovascular: tachycardic no murmurs / rubs / gallops. 2-3+ L, 2+ RLE  extremity edema. 2+ pedal pulses. No carotid bruits.  Abdomen: no tenderness, no masses palpated. No hepatosplenomegaly. Bowel sounds positive.  Musculoskeletal: no clubbing / cyanosis. No joint deformity upper and lower extremities. Good ROM, no contractures. Normal muscle tone.  Skin: no rashes, lesions, ulcers. Very dry  Neurologic: CN 2-12 grossly intact. Sensation intact, DTR normal. Strength 5/5 in all 4.      Labs on Admission: I have personally reviewed following labs and imaging studies  CBC:  Recent Labs Lab 09/18/16 1652 10-09-16 1120  WBC 3.0* 12.0*  NEUTROABS 1.8 11.1*  HGB 11.9* 12.5*  HCT 34.8* 36.7*  MCV 79.3 79.3  PLT 72* 83*    Basic Metabolic Panel:  Recent Labs Lab 09/18/16 1652 2016/10/09 1120  NA 138 136  K 4.9 5.6*  CL 100* 96*  CO2 24 21*  GLUCOSE 110* 52*  BUN 40* 73*  CREATININE 6.50* 8.82*  CALCIUM 9.6 9.3    GFR: Estimated Creatinine Clearance: 9.7 mL/min (by C-G formula based on SCr of 8.82 mg/dL (H)).  Liver Function Tests:  Recent Labs Lab 09/18/16 1652 09-Oct-2016 1120  AST 50* 74*  ALT 19 24  ALKPHOS 168* 141*    BILITOT 3.3* 6.0*  PROT 8.1 7.8  ALBUMIN 2.8* 2.5*    Recent Labs Lab 09/18/16 1652  LIPASE 83*   No results for input(s): AMMONIA in the last 168 hours.  Coagulation Profile: No results for input(s): INR, PROTIME in the last 168 hours.  Cardiac Enzymes: No results for input(s): CKTOTAL, CKMB, CKMBINDEX, TROPONINI in the last 168 hours.  BNP (last 3 results) No results for input(s): PROBNP in the last 8760 hours.  HbA1C: No results for input(s): HGBA1C in the last 72 hours.  CBG: No results for input(s): GLUCAP in the last 168 hours.  Lipid Profile: No results for input(s): CHOL, HDL, LDLCALC, TRIG, CHOLHDL, LDLDIRECT in the last 72 hours.  Thyroid Function Tests: No results for input(s): TSH, T4TOTAL, FREET4, T3FREE, THYROIDAB in the last 72 hours.  Anemia Panel: No results for input(s): VITAMINB12, FOLATE, FERRITIN, TIBC, IRON, RETICCTPCT in the last 72 hours.  Urine analysis:    Component Value Date/Time   COLORURINE YELLOW 05/03/2013 0603   APPEARANCEUR CLEAR 05/03/2013 0603   LABSPEC 1.012 05/03/2013 0603   PHURINE 8.5 (H) 05/03/2013 0603   GLUCOSEU NEGATIVE 05/03/2013 0603   HGBUR SMALL (A) 05/03/2013 0603   BILIRUBINUR NEGATIVE 05/03/2013 0603   KETONESUR NEGATIVE 05/03/2013 0603   PROTEINUR >300 (A) 05/03/2013 0603   UROBILINOGEN 0.2 05/03/2013 0603   NITRITE NEGATIVE 05/03/2013 0603   LEUKOCYTESUR NEGATIVE 05/03/2013 0603  Sepsis Labs: @LABRCNTIP (procalcitonin:4,lacticidven:4) ) Recent Results (from the past 240 hour(s))  Anaerobic culture     Status: None   Collection Time: 09/19/16  3:22 PM  Result Value Ref Range Status   Specimen Description PERITONEAL  Final   Special Requests NONE  Final   Culture   Final    NO ANAEROBES ISOLATED Performed at [facility]    Report Status 09/23/2016 FINAL  Final  Body fluid culture     Status: None   Collection Time: 09/19/16  3:22 PM  Result Value Ref Range Status   Specimen Description  PERITONEAL  Final   Special Requests NONE  Final   Gram Stain   Final    FEW WBC PRESENT, PREDOMINANTLY MONONUCLEAR NO ORGANISMS SEEN    Culture   Final    NO GROWTH 3 DAYS Performed at [facility], [address], [place], [place] [address]    Report Status 09/23/2016 FINAL  Final  Acid Fast Smear (AFB)     Status: None   Collection Time: 09/19/16  3:22 PM  Result Value Ref Range Status   AFB Specimen Processing Concentration  Final   Acid Fast Smear Negative  Final    Comment: (NOTE) Performed At: [facility] [address] [place], [place] [id number] Mila Homer MD [id number]    Source (AFB) PERITONEAL  Final     Radiological Exams on Admission: Ct Head Wo Contrast  Result Date: 09/15/2016 CLINICAL DATA:  Altered mental status EXAM: CT HEAD WITHOUT CONTRAST TECHNIQUE: Contiguous axial images were obtained from the base of the skull through the vertex without intravenous contrast. COMPARISON:  03/24/2016 FINDINGS: Brain: No evidence of acute infarction, hemorrhage, hydrocephalus, extra-axial collection or mass lesion/mass effect. Periventricular white matter low attenuation as can be seen with microvascular disease. Generalized cerebral atrophy. Vascular: No hyperdense vessel or unexpected calcification. Skull: No osseous abnormality. Sinuses/Orbits: Mucous retention cyst in bilateral maxillary sinuses. Visualized mastoid sinuses are clear. Visualized orbits demonstrate no focal abnormality. Other: None IMPRESSION: 1. No acute intracranial pathology. Electronically Signed   By: Elige Ko   On: 09/26/2016 14:22   Dg Chest Portable 1 View  Result Date: 09/30/2016 CLINICAL DATA:  Cough, congestion, pneumonia, sepsis EXAM: PORTABLE CHEST 1 VIEW COMPARISON:  09/18/2016 FINDINGS: Mild patchy right lower lobe opacity. No frank interstitial edema. No pleural effusion or pneumothorax. Cardiomegaly. IMPRESSION: Mild patchy right lower lobe opacity, atelectasis versus  pneumonia. Electronically Signed   By: Charline Bills M.D.   On: 09/25/2016 14:51    EKG: Independently reviewed.  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  Antibiotics delivered in the ED with Vanc and Zosyn  Initial Lactic acid 3.77  WBC 12  patient's ABG notable for TCO2 28 Osats were down to 87 in RA, now in the 90s on 3 l  Will place  patient on oxygen and titrate O2 as needed. CXR RLL PNA. CT head taken for AMS (now resolved), showed no acute abnormalities. Receved total 1.5 l fluid.  Admit to SDU Sepsis order set  IV antibiotics by pharmacy with Vanco and Zosyn per pharmacy  DuoNebs Follow lactic acid  Follow blood cultures Will hold IVF until after HD  Procalcitonin order set   Chronic combined systolic and diastolic CHF,last 2 d echo with Systolic function was moderately to severely reduced.   30% to 35%. Diffuse hypokinesis. Weight 204 lbs . EKG  Tach, Afib QTC  He received  1.500 mL normal saline, Vanco and Zosyn.  Continue meds  -monitor I/Os -daily weights -prn 02  ESRD on HD TTS Cr 8.82 Nephrology involved, Dr. Hyman Hopes notified. Renal Diet. Other plans as per Nephrology Check CMET in am   GERD, no acute symptoms: Continue PPI  Thrombocytopenia Likely due to splenomegaly, liver disease current platelet count  83,000.  No transfusion is indicated at this time Monitor counts closely  Transfuse 1 unit of platelets if count is less or equal than 10,000 or 20,000 if the patient is acutely bleeding   Ascites in the setting of CHF and liver disease   s/p paracentesis 1/12  No abdominal pain, continue to monitor for reaccumulation    DVT prophylaxis: SCD's   Code Status:   Full    Family Communication:  Discussed with patient Disposition Plan: Expect patient to be discharged to home after condition improves Consults called:  nephrology    Admission status  SDU    [facility], PA-C [facility]   09/26/2016, 5:13 PM

## 2016-09-25 ENCOUNTER — Inpatient Hospital Stay (HOSPITAL_COMMUNITY): Payer: Medicare Other

## 2016-09-25 ENCOUNTER — Encounter (HOSPITAL_COMMUNITY): Payer: Self-pay | Admitting: Cardiology

## 2016-09-25 DIAGNOSIS — I248 Other forms of acute ischemic heart disease: Secondary | ICD-10-CM

## 2016-09-25 DIAGNOSIS — I5043 Acute on chronic combined systolic (congestive) and diastolic (congestive) heart failure: Secondary | ICD-10-CM

## 2016-09-25 DIAGNOSIS — I420 Dilated cardiomyopathy: Secondary | ICD-10-CM

## 2016-09-25 DIAGNOSIS — R6520 Severe sepsis without septic shock: Secondary | ICD-10-CM

## 2016-09-25 DIAGNOSIS — R748 Abnormal levels of other serum enzymes: Secondary | ICD-10-CM

## 2016-09-25 DIAGNOSIS — I4891 Unspecified atrial fibrillation: Secondary | ICD-10-CM

## 2016-09-25 DIAGNOSIS — G9340 Encephalopathy, unspecified: Secondary | ICD-10-CM

## 2016-09-25 LAB — COMPREHENSIVE METABOLIC PANEL
ALBUMIN: 2.3 g/dL — AB (ref 3.5–5.0)
ALK PHOS: 126 U/L (ref 38–126)
ALT: 24 U/L (ref 17–63)
ALT: 23 U/L (ref 17–63)
ANION GAP: 14 (ref 5–15)
ANION GAP: 14 (ref 5–15)
AST: 62 U/L — AB (ref 15–41)
AST: 53 U/L — ABNORMAL HIGH (ref 15–41)
Albumin: 2.3 g/dL — ABNORMAL LOW (ref 3.5–5.0)
Alkaline Phosphatase: 113 U/L (ref 38–126)
BILIRUBIN TOTAL: 6.8 mg/dL — AB (ref 0.3–1.2)
BUN: 84 mg/dL — AB (ref 6–20)
BUN: 45 mg/dL — ABNORMAL HIGH (ref 6–20)
CALCIUM: 9.2 mg/dL (ref 8.9–10.3)
CHLORIDE: 96 mmol/L — AB (ref 101–111)
CO2: 24 mmol/L (ref 22–32)
CO2: 24 mmol/L (ref 22–32)
CREATININE: 9.22 mg/dL — AB (ref 0.61–1.24)
Calcium: 8.9 mg/dL (ref 8.9–10.3)
Chloride: 100 mmol/L — ABNORMAL LOW (ref 101–111)
Creatinine, Ser: 6.11 mg/dL — ABNORMAL HIGH (ref 0.61–1.24)
GFR calc Af Amer: 6 mL/min — ABNORMAL LOW (ref >60)
GFR calc non Af Amer: 5 mL/min — ABNORMAL LOW (ref >60)
GFR, EST AFRICAN AMERICAN: 10 mL/min — AB (ref >60)
GFR, EST NON AFRICAN AMERICAN: 9 mL/min — AB (ref >60)
GLUCOSE: 91 mg/dL (ref 65–99)
Glucose, Bld: 111 mg/dL — ABNORMAL HIGH (ref 65–99)
POTASSIUM: 4.5 mmol/L (ref 3.5–5.1)
Potassium: 5.4 mmol/L — ABNORMAL HIGH (ref 3.5–5.1)
SODIUM: 138 mmol/L (ref 135–145)
SODIUM: 134 mmol/L — AB (ref 135–145)
Total Bilirubin: 6.7 mg/dL — ABNORMAL HIGH (ref 0.3–1.2)
Total Protein: 7.4 g/dL (ref 6.5–8.1)
Total Protein: 7.1 g/dL (ref 6.5–8.1)

## 2016-09-25 LAB — RESPIRATORY PANEL BY PCR
ADENOVIRUS-RVPPCR: NOT DETECTED
Bordetella pertussis: NOT DETECTED
CHLAMYDOPHILA PNEUMONIAE-RVPPCR: NOT DETECTED
CORONAVIRUS NL63-RVPPCR: NOT DETECTED
CORONAVIRUS OC43-RVPPCR: NOT DETECTED
Coronavirus 229E: NOT DETECTED
Coronavirus HKU1: DETECTED — AB
INFLUENZA A-RVPPCR: NOT DETECTED
Influenza B: NOT DETECTED
Metapneumovirus: NOT DETECTED
Mycoplasma pneumoniae: NOT DETECTED
Parainfluenza Virus 1: NOT DETECTED
Parainfluenza Virus 2: NOT DETECTED
Parainfluenza Virus 3: NOT DETECTED
Parainfluenza Virus 4: NOT DETECTED
RESPIRATORY SYNCYTIAL VIRUS-RVPPCR: NOT DETECTED
Rhinovirus / Enterovirus: NOT DETECTED

## 2016-09-25 LAB — CBC WITH DIFFERENTIAL/PLATELET
BASOS PCT: 0 %
Band Neutrophils: 0 %
Basophils Absolute: 0 10*3/uL (ref 0.0–0.1)
Blasts: 0 %
EOS ABS: 0.1 10*3/uL (ref 0.0–0.7)
EOS PCT: 1 %
HCT: 34.4 % — ABNORMAL LOW (ref 39.0–52.0)
Hemoglobin: 11.7 g/dL — ABNORMAL LOW (ref 13.0–17.0)
LYMPHS ABS: 0.2 10*3/uL — AB (ref 0.7–4.0)
Lymphocytes Relative: 2 %
MCH: 26.8 pg (ref 26.0–34.0)
MCHC: 34 g/dL (ref 30.0–36.0)
MCV: 78.7 fL (ref 78.0–100.0)
MONO ABS: 0.7 10*3/uL (ref 0.1–1.0)
MONOS PCT: 6 %
MYELOCYTES: 0 %
Metamyelocytes Relative: 0 %
NEUTROS ABS: 10.6 10*3/uL — AB (ref 1.7–7.7)
NEUTROS PCT: 91 %
NRBC: 0 /100{WBCs}
PLATELETS: 81 10*3/uL — AB (ref 150–400)
Promyelocytes Absolute: 0 %
RBC: 4.37 MIL/uL (ref 4.22–5.81)
RDW: 17.7 % — AB (ref 11.5–15.5)
WBC: 11.6 10*3/uL — ABNORMAL HIGH (ref 4.0–10.5)

## 2016-09-25 LAB — GLUCOSE, CAPILLARY
GLUCOSE-CAPILLARY: 71 mg/dL (ref 65–99)
GLUCOSE-CAPILLARY: 82 mg/dL (ref 65–99)
Glucose-Capillary: 55 mg/dL — ABNORMAL LOW (ref 65–99)
Glucose-Capillary: 73 mg/dL (ref 65–99)
Glucose-Capillary: 72 mg/dL (ref 65–99)

## 2016-09-25 LAB — TROPONIN I
TROPONIN I: 0.23 ng/mL — AB (ref <0.03)
TROPONIN I: 0.21 ng/mL — AB (ref <0.03)
Troponin I: 0.21 ng/mL (ref <0.03)
Troponin I: 0.22 ng/mL (ref <0.03)

## 2016-09-25 LAB — CBC
HCT: 34.3 % — ABNORMAL LOW (ref 39.0–52.0)
HEMOGLOBIN: 11.7 g/dL — AB (ref 13.0–17.0)
MCH: 27 pg (ref 26.0–34.0)
MCHC: 34.1 g/dL (ref 30.0–36.0)
MCV: 79.2 fL (ref 78.0–100.0)
Platelets: 83 10*3/uL — ABNORMAL LOW (ref 150–400)
RBC: 4.33 MIL/uL (ref 4.22–5.81)
RDW: 17.9 % — AB (ref 11.5–15.5)
WBC: 12.3 10*3/uL — ABNORMAL HIGH (ref 4.0–10.5)

## 2016-09-25 LAB — LACTIC ACID, PLASMA: Lactic Acid, Venous: 2.3 mmol/L (ref 0.5–1.9)

## 2016-09-25 LAB — AMMONIA: AMMONIA: 47 umol/L — AB (ref 9–35)

## 2016-09-25 MED ORDER — ACETAMINOPHEN 325 MG PO TABS: 650.0000 mg | ORAL_TABLET | Freq: Four times a day (QID) | ORAL | Status: DC | PRN

## 2016-09-25 MED ORDER — ALBUMIN HUMAN 25 % IV SOLN
12.5000 g | Freq: Once | INTRAVENOUS | Status: AC
  Administered 2016-09-25: 12.5 g via INTRAVENOUS

## 2016-09-25 MED ORDER — DIGOXIN 125 MCG PO TABS
0.1250 mg | ORAL_TABLET | Freq: Every day | ORAL | Status: DC
  Administered 2016-09-26: 0.125 mg via ORAL
  Filled 2016-09-25: qty 1

## 2016-09-25 MED ORDER — MIDODRINE HCL 5 MG PO TABS
10.0000 mg | ORAL_TABLET | Freq: Two times a day (BID) | ORAL | Status: DC
  Administered 2016-09-25 – 2016-09-26 (×2): 10 mg via ORAL
  Filled 2016-09-25 (×2): qty 2

## 2016-09-25 MED ORDER — LACTULOSE 10 GM/15ML PO SOLN: 20.0000 g | Freq: Once | ORAL | Status: DC

## 2016-09-25 MED ORDER — ALBUTEROL SULFATE (2.5 MG/3ML) 0.083% IN NEBU: 2.5000 mg | INHALATION_SOLUTION | RESPIRATORY_TRACT | Status: DC | PRN

## 2016-09-25 MED ORDER — VANCOMYCIN HCL IN DEXTROSE 1-5 GM/200ML-% IV SOLN
INTRAVENOUS | Status: AC
  Filled 2016-09-25: qty 200

## 2016-09-25 MED ORDER — VANCOMYCIN HCL IN DEXTROSE 1-5 GM/200ML-% IV SOLN
1000.0000 mg | INTRAVENOUS | Status: DC
  Administered 2016-09-25: 1000 mg via INTRAVENOUS

## 2016-09-25 MED ORDER — DIGOXIN 0.25 MG/ML IJ SOLN
0.1250 mg | Freq: Once | INTRAMUSCULAR | Status: AC
  Administered 2016-09-25: 0.125 mg via INTRAVENOUS
  Filled 2016-09-25: qty 2

## 2016-09-25 MED ORDER — ALBUMIN HUMAN 25 % IV SOLN
INTRAVENOUS | Status: AC
  Filled 2016-09-25: qty 100

## 2016-09-25 MED ORDER — GUAIFENESIN ER 600 MG PO TB12
1200.0000 mg | ORAL_TABLET | Freq: Two times a day (BID) | ORAL | Status: DC
  Administered 2016-09-25 – 2016-09-26 (×3): 1200 mg via ORAL
  Filled 2016-09-25 (×3): qty 2

## 2016-09-25 MED ORDER — DOXERCALCIFEROL 4 MCG/2ML IV SOLN
3.0000 ug | INTRAVENOUS | Status: DC
  Administered 2016-09-26 – 2016-10-03 (×4): 3 ug via INTRAVENOUS
  Filled 2016-09-25 (×8): qty 2

## 2016-09-25 MED ORDER — DEXTROSE 50 % IV SOLN
INTRAVENOUS | Status: AC
  Administered 2016-09-25: 25 mL via INTRAVENOUS
  Filled 2016-09-25: qty 50

## 2016-09-25 NOTE — Consult Note (Signed)
[facility] Renal Consultation Note  Indication for Consultation:  Management of ESRD/hemodialysis; anemia, hypertension/volume and secondary hyperparathyroidism  HPI: Trevor [name] is a 66 y.o. male admitted with acute respiratory failure from HCAP and sepsis .He has ESRD  On chronic [facility] M[facility] . He presented with  SOB, fever, cough, confusion and decreased sp02 to 80s.  He had EKG a fib wit RVR at 121 no changes from 3/2017and lower ext edema.  Noted he had US paracentesis on the 12th as outpt. Sec to his HO  NICM with last EF on cath 15-20% / RHF with recurrent ascites . [facility] seen 09/22/16 by Dr. Mercy Moore  With low grade temp and uri symptoms  BC were drawn. He reports progressive sob and weakness .   We are consulted for ESRD issues and HD . CXR today cw volume overlaod and he is getting hd today.    Past Medical History:  Diagnosis Date  . Acute on chronic systolic right heart failure ([place]) 11/14/2015  . Anemia   . Atrial fibrillation ([place])    not on coumadin due to large spontaneous hematoma on back and anemia  . Atrial flutter ([place]) 04/27/2013  . AVF (arteriovenous fistula) (HCC)    Left  . CHF (congestive heart failure) (HCC)    EF 20-25%  . Coronary artery disease   . Diverticulosis   . Dysrhythmia    afib  . Elevated troponin 11/14/2015  . ESRD (end stage renal disease) on dialysis ([place])    [facility] mon/wed/fri  . Exertional shortness of breath    "related to infection in my lungs right now" (05/03/2013)  . GERD (gastroesophageal reflux disease)   . Hepatosplenomegaly 10/18/2015   Workup per Dr Kaplan/ GI in Dec 2015 > abd pain improved after paracentesis x 2 (1.2L, 1.3L). Had negative hepA/ hep B/ hep C testing. For hepatosplenomagealy pt underwent transjugular liver biopsy by IR with hepatic vein wedge pressure measurement which showed elevated right heart and free hepatic venous pressures likely due to right heart  failure.  There was no evidence of portal hypertension. Liver bx originally was reported as "End stage liver disease/ cirrhosis", then was corrected > "No evidence of cirrhosis".  The corrected liver biopsy result reads "SINUSOIDAL DILATATION WITH SCATTERED FOCI OF HEPATITIS".  Last GI visit was Aug 2016 for bloating/ abd pain, noted he had a normal gastric empty study.  -Right lobe liver lesion. Felt to be hemangioma, Biopsy pending.  -thrombocytopenia. Dates back to 08/2013.  -ESRD. MWF HD.  -biventricular heart failure, acute right sided heart failure. Daily HD to address volume overload.    Marland Kitchen Hereditary and idiopathic peripheral neuropathy 08/29/2015  . History of gout    "before I started doing the dialysis" (05/03/2013)  . Hypertension   . Hypertensive urgency    H/o  . Hypovitaminosis D   . Myocardial infarction 90's  . Peripheral vascular disease ([place])    dvt leg 12/14  . Pulmonary embolism ([place])    with right DVT secondary to recent surgery  . Rectal bleeding 11/2015  . Renal insufficiency   . S/P repair of ventral hernia 03/21/2014  . Secondary hyperparathyroidism ([place])   . Sleep apnea   . Syncope    felt secondary to residual anesthesia the day before - 2D echo unremarkable    Past Surgical History:  Procedure Laterality Date  . AV FISTULA PLACEMENT Left    Dr. Su Grand; "  I've had 2 on the left' (05/03/2013)  . AV FISTULA PLACEMENT Right ~ 2011  . [place] REMOVAL Right 05/04/2013   Procedure: REMOVAL OF ARTERIOVENOUS Fistula Right Arm;  Surgeon: Elam Dutch, MD;  Location: [place];  Service: Vascular;  Laterality: Right;  . BASCILIC VEIN TRANSPOSITION Left 06/27/2013   Procedure: BASCILIC VEIN TRANSPOSITION;  Surgeon: Elam Dutch, MD;  Location: [place];  Service: Vascular;  Laterality: Left;  . CARDIAC CATHETERIZATION N/A 11/15/2015   Procedure: Right Heart Cath;  Surgeon: Troy Sine, MD;  Location: [place] [facility];  Service: Cardiovascular;  Laterality: N/A;  . CARDIAC  CATHETERIZATION Right 11/15/2015   Procedure: Left Heart Cath;  Surgeon: Troy Sine, MD;  Location: [place] [facility];  Service: Cardiovascular;  Laterality: Right;  . CARDIOVERSION N/A 08/29/2013   Procedure: CARDIOVERSION;  Surgeon: Sueanne Margarita, MD;  Location: [facility] ENDOSCOPY;  Service: Cardiovascular;  Laterality: N/A;  . COLONOSCOPY  10-29-2005   Hx: of  . COLONOSCOPY N/A 11/30/2015   Procedure: COLONOSCOPY;  Surgeon: Ladene Artist, MD;  Location: [facility] ENDOSCOPY;  Service: Endoscopy;  Laterality: N/A;  . ESOPHAGOGASTRODUODENOSCOPY N/A 11/30/2015   Procedure: ESOPHAGOGASTRODUODENOSCOPY (EGD);  Surgeon: Ladene Artist, MD;  Location: [facility] ENDOSCOPY;  Service: Endoscopy;  Laterality: N/A;  . HERNIA REPAIR  1/41/03   Umbilical hernia-Dr. Rosendo Gros  . INSERTION OF DIALYSIS CATHETER Right 05/04/2013   Procedure: INSERTION OF DIALYSIS CATHETER;  Surgeon: Elam Dutch, MD;  Location: [place];  Service: Vascular;  Laterality: Right;  . INSERTION OF MESH N/A 03/21/2014   Procedure: INSERTION OF MESH;  Surgeon: Ralene Ok, MD;  Location: [place];  Service: General;  Laterality: N/A;  . KNEE ARTHROSCOPY Left   . REMOVAL OF A DIALYSIS CATHETER  2/15  . UMBILICAL HERNIA REPAIR N/A 03/21/2014   Procedure: LAPAROSCOPIC UMBILICAL HERNIA REPAIR WITH MESH;  Surgeon: Ralene Ok, MD;  Location: [place];  Service: General;  Laterality: N/A;  . VENOGRAM Left 05/26/2013   Procedure: VENOGRAM;  Surgeon: Conrad [place], MD;  Location: [facility] CATH LAB;  Service: Cardiovascular;  Laterality: Left;      Family History  Problem Relation Age of Onset  . Hypertension Father   . Kidney disease Father   . Allergies Father   . Deep vein thrombosis Sister   . Pulmonary embolism Sister   . Diabetes Paternal Grandmother       reports that he quit smoking about 44 years ago. His smoking use included Cigarettes. He has a 0.12 pack-year smoking history. He has never used smokeless tobacco. He reports that he does not drink  alcohol or use drugs.   Allergies  Allergen Reactions  . Pork-Derived Products Other (See Comments)    No pork products for religious reasons    Prior to Admission medications   Medication Sig Start Date End Date Taking? Authorizing Provider  albuterol (PROVENTIL HFA;VENTOLIN HFA) 108 (90 Base) MCG/ACT inhaler Inhale 2 puffs into the lungs every 4 (four) hours as needed for wheezing or shortness of breath.   Yes Historical Provider, MD  labetalol (NORMODYNE) 200 MG tablet Take 100 mg by mouth once a week.  12/24/15  Yes Historical Provider, MD  oxyCODONE-acetaminophen (PERCOCET/ROXICET) 5-325 MG tablet Take 1 tablet by mouth every 6 (six) hours as needed for severe pain. Pain  10/29/15  Yes Historical Provider, MD  RENVELA 800 MG tablet Take by mouth 3 (three) times daily with meals. Take 1-4 tablets with each meal 03/20/16  Yes Historical Provider, MD  aspirin EC 325 MG EC tablet Take 1 tablet (325 mg total) by mouth daily. Patient not taking: Reported on 09/19/2016 03/26/16   Reyne Dumas, MD  cephALEXin (KEFLEX) 250 MG capsule Take 1 capsule (250 mg total) by mouth 2 (two) times daily. Patient not taking: Reported on 09/19/2016 06/22/16   Melanee Left, NP  fluticasone [facility]) 50 MCG/ACT nasal spray 1-2 Sprays per nostril daily as needed. Patient not taking: Reported on 09/19/2016 04/15/16   Adelina Mings, MD  hydrOXYzine (ATARAX/VISTARIL) 10 MG tablet Take 1 tablet (10 mg total) by mouth every 6 (six) hours as needed for itching. Patient not taking: Reported on 09/19/2016 03/26/16   Reyne Dumas, MD  nitroGLYCERIN (NITROSTAT) 0.4 MG SL tablet Place 1 tablet (0.4 mg total) under the tongue every 5 (five) minutes as needed for chest pain. Patient not taking: Reported on 10/02/2016 11/17/15   Eugenie Filler, MD  Nutritional Supplements (FEEDING SUPPLEMENT, NEPRO CARB STEADY,) LIQD Take 237 mLs by mouth 2 (two) times daily between meals. Patient not taking: Reported on 09/19/2016 03/26/16    Reyne Dumas, MD     Anti-infectives    Start     Dose/Rate Route Frequency Ordered Stop   09/25/16 1200  vancomycin (VANCOCIN) IVPB 1000 mg/200 mL premix     1,000 mg 200 mL/hr over 60 Minutes Intravenous Every T-Th-Sa (Hemodialysis) 09/25/16 1037     09/25/16 1200  vancomycin (VANCOCIN) 1-5 GM/200ML-% IVPB    Comments:  Vernell Leep, [place]   : cabinet override      09/25/16 1200 09/25/16 1322   09/08/2016 2200  piperacillin-tazobactam (ZOSYN) IVPB 3.375 g     3.375 g 12.5 mL/hr over 240 Minutes Intravenous Every 12 hours 09/23/2016 1059     09/15/2016 2200  azithromycin (ZITHROMAX) 500 mg in dextrose 5 % 250 mL IVPB  Status:  Discontinued     500 mg 250 mL/hr over 60 Minutes Intravenous Every 24 hours 09/26/2016 1602 09/16/2016 1606   09/15/2016 1615  cefTRIAXone (ROCEPHIN) 1 g in dextrose 5 % 50 mL IVPB  Status:  Discontinued     1 g 100 mL/hr over 30 Minutes Intravenous Every 24 hours 09/23/2016 1602 09/20/2016 1603   09/19/2016 1615  vancomycin (VANCOCIN) 2,000 mg in sodium chloride 0.9 % 500 mL IVPB  Status:  Discontinued     2,000 mg 250 mL/hr over 120 Minutes Intravenous  Once 09/15/2016 1605 09/30/2016 1607   09/10/2016 1615  piperacillin-tazobactam (ZOSYN) IVPB 3.375 g  Status:  Discontinued    Comments:  CAP and sepsis. Per pharmacy dose   3.375 g 100 mL/hr over 30 Minutes Intravenous  Once 09/27/2016 1605 09/14/2016 1607   09/19/2016 1100  piperacillin-tazobactam (ZOSYN) IVPB 3.375 g     3.375 g 100 mL/hr over 30 Minutes Intravenous  Once 09/23/2016 1051 09/17/2016 1229   09/23/2016 1100  vancomycin (VANCOCIN) IVPB 1000 mg/200 mL premix  Status:  Discontinued     1,000 mg 200 mL/hr over 60 Minutes Intravenous  Once 09/17/2016 1051 09/21/2016 1057   09/23/2016 1100  vancomycin (VANCOCIN) 2,000 mg in sodium chloride 0.9 % 500 mL IVPB     2,000 mg 250 mL/hr over 120 Minutes Intravenous  Once 09/18/2016 1057 09/27/2016 1426      Results for orders placed or performed during the hospital encounter of 09/25/2016 (from  the past 48 hour(s))  Comprehensive metabolic panel     Status: Abnormal   Collection Time: 09/30/2016 11:20 AM  Result Value Ref Range   Sodium 136 135 - 145 mmol/L   Potassium 5.6 (H) 3.5 - 5.1 mmol/L   Chloride 96 (L) 101 - 111 mmol/L   CO2 21 (L) 22 - 32 mmol/L   Glucose, Bld 52 (L) 65 - 99 mg/dL   BUN 73 (H) 6 - 20 mg/dL   Creatinine, Ser 8.82 (H) 0.61 - 1.24 mg/dL   Calcium 9.3 8.9 - 10.3 mg/dL   Total Protein 7.8 6.5 - 8.1 g/dL   Albumin 2.5 (L) 3.5 - 5.0 g/dL   AST 74 (H) 15 - 41 U/L   ALT 24 17 - 63 U/L   Alkaline Phosphatase 141 (H) 38 - 126 U/L   Total Bilirubin 6.0 (H) 0.3 - 1.2 mg/dL   GFR calc non Af Amer 6 (L) >60 mL/min   GFR calc Af Amer 7 (L) >60 mL/min    Comment: (NOTE) The eGFR has been calculated using the CKD EPI equation. This calculation has not been validated in all clinical situations. eGFR's persistently <60 mL/min signify possible Chronic Kidney Disease.    Anion gap 19 (H) 5 - 15  Ethanol     Status: None   Collection Time: 09/29/2016 11:20 AM  Result Value Ref Range   Alcohol, Ethyl (B) <5 <5 mg/dL    Comment:        LOWEST DETECTABLE LIMIT FOR SERUM ALCOHOL IS 5 mg/dL FOR MEDICAL PURPOSES ONLY   Acetaminophen level     Status: Abnormal   Collection Time: 09/28/2016 11:20 AM  Result Value Ref Range   Acetaminophen (Tylenol), Serum <10 (L) 10 - 30 ug/mL    Comment:        THERAPEUTIC CONCENTRATIONS VARY SIGNIFICANTLY. A RANGE OF 10-30 ug/mL MAY BE AN EFFECTIVE CONCENTRATION FOR MANY PATIENTS. HOWEVER, SOME ARE BEST TREATED AT CONCENTRATIONS OUTSIDE THIS RANGE. ACETAMINOPHEN CONCENTRATIONS >150 ug/mL AT 4 HOURS AFTER INGESTION AND >50 ug/mL AT 12 HOURS AFTER INGESTION ARE OFTEN ASSOCIATED WITH TOXIC REACTIONS.   Salicylate level     Status: None   Collection Time: 10/02/2016 11:20 AM  Result Value Ref Range   Salicylate Lvl <9.1 2.8 - 30.0 mg/dL  CBC with Differential     Status: Abnormal   Collection Time: 10/06/2016 11:20 AM  Result  Value Ref Range   WBC 12.0 (H) 4.0 - 10.5 K/uL   RBC 4.63 4.22 - 5.81 MIL/uL   Hemoglobin 12.5 (L) 13.0 - 17.0 g/dL   HCT 36.7 (L) 39.0 - 52.0 %   MCV 79.3 78.0 - 100.0 fL   MCH 27.0 26.0 - 34.0 pg   MCHC 34.1 30.0 - 36.0 g/dL   RDW 18.0 (H) 11.5 - 15.5 %   Platelets 83 (L) 150 - 400 K/uL    Comment: REPEATED TO VERIFY PLATELET COUNT CONFIRMED BY SMEAR    Neutrophils Relative % 93 %   Neutro Abs 11.1 (H) 1.7 - 7.7 K/uL   Lymphocytes Relative 4 %   Lymphs Abs 0.5 (L) 0.7 - 4.0 K/uL   Monocytes Relative 3 %   Monocytes Absolute 0.4 0.1 - 1.0 K/uL   Eosinophils Relative 0 %   Eosinophils Absolute 0.0 0.0 - 0.7 K/uL   Basophils Relative 0 %   Basophils Absolute 0.0 0.0 - 0.1 K/uL  I-stat troponin, ED     Status: Abnormal   Collection Time: 10/01/2016 11:32 AM  Result Value Ref Range   Troponin i, poc 0.28 (HH) 0.00 - 0.08 ng/mL   Comment  NOTIFIED PHYSICIAN    Comment 3            Comment: Due to the release kinetics of cTnI, a negative result within the first hours of the onset of symptoms does not rule out myocardial infarction with certainty. If myocardial infarction is still suspected, repeat the test at appropriate intervals.   I-Stat CG4 Lactic Acid, ED     Status: Abnormal   Collection Time: 09/22/2016 11:34 AM  Result Value Ref Range   Lactic Acid, Venous 3.77 (HH) 0.5 - 1.9 mmol/L   Comment NOTIFIED PHYSICIAN   Influenza panel by PCR (type A & B)     Status: None   Collection Time: 09/21/2016 11:45 AM  Result Value Ref Range   Influenza A By PCR NEGATIVE NEGATIVE   Influenza B By PCR NEGATIVE NEGATIVE    Comment: (NOTE) The Xpert Xpress Flu assay is intended as an aid in the diagnosis of  influenza and should not be used as a sole basis for treatment.  This  assay is FDA approved for nasopharyngeal swab specimens only. Nasal  washings and aspirates are unacceptable for Xpert Xpress Flu testing.   I-Stat venous blood gas, ED     Status: Abnormal   Collection Time:  09/25/2016 12:01 PM  Result Value Ref Range   pH, Ven 7.482 (H) 7.250 - 7.430   pCO2, Ven 36.3 (L) 44.0 - 60.0 mmHg   pO2, Ven 49.0 (H) 32.0 - 45.0 mmHg   Bicarbonate 27.2 20.0 - 28.0 mmol/L   TCO2 28 0 - 100 mmol/L   O2 Saturation 87.0 %   Acid-Base Excess 4.0 (H) 0.0 - 2.0 mmol/L   Patient temperature HIDE    Sample type VENOUS   Lactic acid, plasma     Status: Abnormal   Collection Time: 09/27/2016  2:40 PM  Result Value Ref Range   Lactic Acid, Venous 2.6 (HH) 0.5 - 1.9 mmol/L    Comment: CRITICAL RESULT CALLED TO, READ BACK BY AND VERIFIED WITH: N.STEPHENS,RN 09/08/2016 @ 1540 BY N.LIVINGSTON   CBC with Differential     Status: Abnormal   Collection Time: 09/23/2016  6:15 PM  Result Value Ref Range   WBC 10.1 4.0 - 10.5 K/uL   RBC 4.52 4.22 - 5.81 MIL/uL   Hemoglobin 12.3 (L) 13.0 - 17.0 g/dL   HCT 35.6 (L) 39.0 - 52.0 %   MCV 78.8 78.0 - 100.0 fL   MCH 27.2 26.0 - 34.0 pg   MCHC 34.6 30.0 - 36.0 g/dL   RDW 18.0 (H) 11.5 - 15.5 %   Platelets 85 (L) 150 - 400 K/uL    Comment: REPEATED TO VERIFY SPECIMEN CHECKED FOR CLOTS CONSISTENT WITH PREVIOUS RESULT    Neutrophils Relative % 91 %   Neutro Abs 9.1 (H) 1.7 - 7.7 K/uL   Lymphocytes Relative 4 %   Lymphs Abs 0.4 (L) 0.7 - 4.0 K/uL   Monocytes Relative 5 %   Monocytes Absolute 0.5 0.1 - 1.0 K/uL   Eosinophils Relative 1 %   Eosinophils Absolute 0.1 0.0 - 0.7 K/uL   Basophils Relative 0 %   Basophils Absolute 0.0 0.0 - 0.1 K/uL  Comprehensive metabolic panel     Status: Abnormal   Collection Time: 09/16/2016  6:15 PM  Result Value Ref Range   Sodium 136 135 - 145 mmol/L   Potassium 5.2 (H) 3.5 - 5.1 mmol/L   Chloride 98 (L) 101 - 111 mmol/L   CO2 21 (L)  22 - 32 mmol/L   Glucose, Bld 42 (LL) 65 - 99 mg/dL    Comment: CRITICAL RESULT CALLED TO, READ BACK BY AND VERIFIED WITH: K.DAVIS,RN 10/01/2016 '@1928'  BY V.WILKINS    BUN 77 (H) 6 - 20 mg/dL   Creatinine, Ser 7.42 (H) 0.61 - 1.24 mg/dL   Calcium 9.2 8.9 - 10.3 mg/dL    Total Protein 7.2 6.5 - 8.1 g/dL   Albumin 2.3 (L) 3.5 - 5.0 g/dL   AST 67 (H) 15 - 41 U/L   ALT 25 17 - 63 U/L   Alkaline Phosphatase 132 (H) 38 - 126 U/L   Total Bilirubin 6.1 (H) 0.3 - 1.2 mg/dL   GFR calc non Af Amer 7 (L) >60 mL/min   GFR calc Af Amer 8 (L) >60 mL/min    Comment: (NOTE) The eGFR has been calculated using the CKD EPI equation. This calculation has not been validated in all clinical situations. eGFR's persistently <60 mL/min signify possible Chronic Kidney Disease.    Anion gap 17 (H) 5 - 15  Lactic acid, plasma     Status: Abnormal   Collection Time: 10/01/2016  6:15 PM  Result Value Ref Range   Lactic Acid, Venous 2.6 (HH) 0.5 - 1.9 mmol/L    Comment: CRITICAL RESULT CALLED TO, READ BACK BY AND VERIFIED WITH: M.EL,RN 09/19/2016 '@1923'  BY V.WILKINS   Procalcitonin     Status: None   Collection Time: 09/15/2016  6:15 PM  Result Value Ref Range   Procalcitonin 50.54 ng/mL    Comment:        Interpretation: PCT >= 10 ng/mL: Important systemic inflammatory response, almost exclusively due to severe bacterial sepsis or septic shock. (NOTE)         ICU PCT Algorithm               Non ICU PCT Algorithm    ----------------------------     ------------------------------         PCT < 0.25 ng/mL                 PCT < 0.1 ng/mL     Stopping of antibiotics            Stopping of antibiotics       strongly encouraged.               strongly encouraged.    ----------------------------     ------------------------------       PCT level decrease by               PCT < 0.25 ng/mL       >= 80% from peak PCT       OR PCT 0.25 - 0.5 ng/mL          Stopping of antibiotics                                             encouraged.     Stopping of antibiotics           encouraged.    ----------------------------     ------------------------------       PCT level decrease by              PCT >= 0.25 ng/mL       < 80% from peak PCT        AND PCT >=  0.5 ng/mL             Continuing  antibiotics                                              encouraged.       Continuing antibiotics            encouraged.    ----------------------------     ------------------------------     PCT level increase compared          PCT > 0.5 ng/mL         with peak PCT AND          PCT >= 0.5 ng/mL             Escalation of antibiotics                                          strongly encouraged.      Escalation of antibiotics        strongly encouraged.   Protime-INR     Status: Abnormal   Collection Time: 10/01/2016  6:15 PM  Result Value Ref Range   Prothrombin Time 18.6 (H) 11.4 - 15.2 seconds   INR 1.53   APTT     Status: Abnormal   Collection Time: 09/11/2016  6:15 PM  Result Value Ref Range   aPTT 46 (H) 24 - 36 seconds    Comment:        IF BASELINE aPTT IS ELEVATED, SUGGEST PATIENT RISK ASSESSMENT BE USED TO DETERMINE APPROPRIATE ANTICOAGULANT THERAPY.   Troponin I     Status: Abnormal   Collection Time: 09/28/2016  6:15 PM  Result Value Ref Range   Troponin I 0.30 (HH) <0.03 ng/mL    Comment: CRITICAL RESULT CALLED TO, READ BACK BY AND VERIFIED WITH: K.DAVIS,RN 10/01/2016 '@1928'  BY V.WILKINS   Glucose, capillary     Status: Abnormal   Collection Time: 09/20/2016  7:33 PM  Result Value Ref Range   Glucose-Capillary 43 (LL) 65 - 99 mg/dL   Comment 1 Notify RN   Glucose, capillary     Status: None   Collection Time: 09/19/2016  8:23 PM  Result Value Ref Range   Glucose-Capillary 73 65 - 99 mg/dL   Comment 1 Notify RN   Troponin I     Status: Abnormal   Collection Time: 09/27/2016  9:44 PM  Result Value Ref Range   Troponin I 0.25 (HH) <0.03 ng/mL    Comment: CRITICAL VALUE NOTED.  VALUE IS CONSISTENT WITH PREVIOUSLY REPORTED AND CALLED VALUE.  Lactic acid, plasma     Status: Abnormal   Collection Time: 09/13/2016  9:44 PM  Result Value Ref Range   Lactic Acid, Venous 3.2 (HH) 0.5 - 1.9 mmol/L    Comment: CRITICAL RESULT CALLED TO, READ BACK BY AND VERIFIED WITH: WOODARD C,RN  10/08/2016 2306 WAYK   Troponin I     Status: Abnormal   Collection Time: 09/25/16  4:07 AM  Result Value Ref Range   Troponin I 0.23 (HH) <0.03 ng/mL    Comment: CRITICAL VALUE NOTED.  VALUE IS CONSISTENT WITH PREVIOUSLY REPORTED AND CALLED VALUE.  Comprehensive metabolic panel     Status: Abnormal   Collection Time: 09/25/16  4:07 AM  Result Value Ref Range   Sodium 138 135 - 145 mmol/L   Potassium 5.4 (H) 3.5 - 5.1 mmol/L   Chloride 100 (L) 101 - 111 mmol/L   CO2 24 22 - 32 mmol/L   Glucose, Bld 91 65 - 99 mg/dL   BUN 84 (H) 6 - 20 mg/dL   Creatinine, Ser 9.22 (H) 0.61 - 1.24 mg/dL   Calcium 9.2 8.9 - 10.3 mg/dL   Total Protein 7.4 6.5 - 8.1 g/dL   Albumin 2.3 (L) 3.5 - 5.0 g/dL   AST 62 (H) 15 - 41 U/L   ALT 24 17 - 63 U/L   Alkaline Phosphatase 126 38 - 126 U/L   Total Bilirubin 6.8 (H) 0.3 - 1.2 mg/dL   GFR calc non Af Amer 5 (L) >60 mL/min   GFR calc Af Amer 6 (L) >60 mL/min    Comment: (NOTE) The eGFR has been calculated using the CKD EPI equation. This calculation has not been validated in all clinical situations. eGFR's persistently <60 mL/min signify possible Chronic Kidney Disease.    Anion gap 14 5 - 15  CBC     Status: Abnormal   Collection Time: 09/25/16  4:07 AM  Result Value Ref Range   WBC 12.3 (H) 4.0 - 10.5 K/uL   RBC 4.33 4.22 - 5.81 MIL/uL   Hemoglobin 11.7 (L) 13.0 - 17.0 g/dL   HCT 34.3 (L) 39.0 - 52.0 %   MCV 79.2 78.0 - 100.0 fL   MCH 27.0 26.0 - 34.0 pg   MCHC 34.1 30.0 - 36.0 g/dL   RDW 17.9 (H) 11.5 - 15.5 %   Platelets 83 (L) 150 - 400 K/uL    Comment: CONSISTENT WITH PREVIOUS RESULT  Troponin I     Status: Abnormal   Collection Time: 09/25/16  7:08 AM  Result Value Ref Range   Troponin I 0.21 (HH) <0.03 ng/mL    Comment: CRITICAL VALUE NOTED.  VALUE IS CONSISTENT WITH PREVIOUSLY REPORTED AND CALLED VALUE.  Lactic acid, plasma     Status: Abnormal   Collection Time: 09/25/16  7:08 AM  Result Value Ref Range   Lactic Acid, Venous 2.3  (HH) 0.5 - 1.9 mmol/L    Comment: CRITICAL RESULT CALLED TO, READ BACK BY AND VERIFIED WITH: T.HOOD,RN 0830 09/25/16 CLARK,S     ROS: see hpi for positives   Physical Exam: Vitals:   09/25/16 1400 09/25/16 1412  BP: 103/64 (!) 108/55  Pulse: (!) 108 (!) 118  Resp: (!) 22 (!) 24  Temp:  98.8 F (37.1 C)     General: thin , chronically il AAM , alert Ox3  Somewhat tachypneic  But in  NAD  HEENT: [place] MM dry EOMI Neck: Pos jvd, supple Heart: tachy reg.  No rub or mur  Lungs: bilat basilar rales  And tachypnea but able to talk  Abdomen:bs pos , soft, NT  and slight distention sec ascites Extremities: trace bipedal edema  Skin: warm ,dry , no overt rash  Neuro: alert ox3 moves all extrrem  Dialysis Access:  Pos bruit Lua avf   Dialysis Orders: [facility]: [facility]   . EDW 89.5 kg HD Bath 2k 2ca  Time 3hr 51mn  Heparin 2000. Access LUA AVF     hec4 mcg IV/HD  Mircera 225 mcg  q 2 weeks(  last given 09/18/16)    Other op labs hgb 8.3, ca 9 phos 7.1 pth 942  29% TFS   Assessment/Plan 1.  Acute Hypoxic  Respiratory  Failure  With  Volume overload and HCAP- UF  On hd now and antibiot  Per admit  2. ESRD - Today  Sec volume / normal; MWF  Will do hd again tomor  3. Gm Pos cocci  Blood cultures positive  (drawn 09/22/16 for febrile illness op hd /No antibiotics given ) awaiting sensitives on Vanco . 4. Hypotension with sepsis-  5. Anemia  - HGB 11.7  Not on ESA 6. Metabolic bone disease -   On Hec .q hd   7. CAD/NICM  15-20% EF  On 3/17 cath / Sp op paracentesis for recurrent ascites  8. Chronic A.Fib (RVR on admit )    Ernest Haber, PA-C [place] [phone] 09/25/2016, 2:35 PM

## 2016-09-25 NOTE — Procedures (Signed)
Patient was seen on dialysis and the procedure was supervised.  BFR 400  Via AVF BP is  86/62.   Patient appears to be tolerating treatment well- low BP but better with IV albumin- attempting a large goal   Trevor [name] A 09/25/2016

## 2016-09-25 NOTE — Progress Notes (Signed)
Recheck CBG 82

## 2016-09-25 NOTE — Progress Notes (Signed)
Pt NIV for the night per RRT. Pt is altered and patient is repeatedly trying to get in and out of the bed., pt wife is at the bedside. Pt SATs are stable no increase WOB noted.

## 2016-09-25 NOTE — Progress Notes (Signed)
Pt confused trying to get pt to eat late lunch post dialysis not cooperating - did eat 1 applesauce  CBG check 55 given 1/2 D50 will recheck CBG in 15 "

## 2016-09-25 NOTE — Progress Notes (Addendum)
PROGRESS NOTE        PATIENT DETAILS Name: Trevor [name] Age: 66 y.o. Sex: male Date of Birth: 1951-05-01 Admit Date: 09/22/2016 Admitting Physician Ozella Rocks, MD WUJ:[id number] Kennon Portela, MD  Brief Narrative: Patient is a 66 y.o. male with prior study of chronic systolic heart failure, atrial fibrillation not on anticoagulation by patient's choice (per prior notes had refused) presented to the ED on 1/17 with several days history of fever, myalgias, shortness of breath. Found to possible pneumonia with sepsis. Subsequently admitted for further evaluation and treatment.   Subjective: Essentially unchanged-continues to be short of breath.  Assessment/Plan: Sepsis with hypotension likely secondary to healthcare associated pneumonia: Remains essentially unchanged, continues to have soft blood pressure. Continue empiric antibiotics. Influenza PCR negative, have ordered a respiratory virus panel. Follow blood cultures. Continue to monitor closely in the stepdown unit, if any further deterioration, will consult critical care.  Acute hypoxic respiratory failure: Multifactorial-likely due to combination of above and possible fluid overload in a setting of ESRD. Keep BiPAP at bedside, taper down oxygen as tolerated-nephrology was consulted urgently this morning for hemodialysis. Repeat chest x-ray tomorrow morning.  Acute metabolic encephalopathy: Likely secondary to hypoxia and sepsis. Resolved. Patient is awake and alert. CT head on 1/17 was negative for acute abnormalities  A. fib RVR: Heart rate at times in the 140s this morning-given soft blood pressure-unable to use rate controlling agents. Continue to monitor in telemetry, cardiology has been consulted.Reviewed prior notes-patient has had GI bleeding in the past-and has elected not to resume anticoagulation.  Elevated troponin levels: Trend is flat-not consistent with ACS. Likely secondary to demand ischemia  and CKD  Chronic systolic heart failure (EF 30-35% on March 2017): Volume removal during hemodialysis-hold all antihypertensives given soft blood pressure.  Mild hyperkalemia: for HD today-recheck lytes in the morning.  ESRD: On HD-TTS-nephrology consulted  Thrombocytopenia: Chronic issue, likely worsened due to sepsis. Follow.  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, hepatosplenomegaly and thrombocytopenia:   DVT Prophylaxis: SCD's-given thrombocytopenia  Code Status: Full code   Family Communication: Nonee at bedside  Disposition Plan: Remain inpatient-but will plan on Home health vs SNF on discharge  Antimicrobial agents: Anti-infectives    Start     Dose/Rate Route Frequency Ordered Stop   09/25/16 1200  vancomycin (VANCOCIN) IVPB 1000 mg/200 mL premix     1,000 mg 200 mL/hr over 60 Minutes Intravenous Every T-Th-Sa (Hemodialysis) 09/25/16 1037     09/25/16 1200  vancomycin (VANCOCIN) 1-5 GM/200ML-% IVPB    Comments:  Serrano, Salvador   : cabinet override      09/25/16 1200 09/25/16 1322   09/21/2016 2200  piperacillin-tazobactam (ZOSYN) IVPB 3.375 g     3.375 g 12.5 mL/hr over 240 Minutes Intravenous Every 12 hours 09/15/2016 1059     09/25/2016 2200  azithromycin (ZITHROMAX) 500 mg in dextrose 5 % 250 mL IVPB  Status:  Discontinued     500 mg 250 mL/hr over 60 Minutes Intravenous Every 24 hours 09/16/2016 1602 10/06/2016 1606   09/25/2016 1615  cefTRIAXone (ROCEPHIN) 1 g in dextrose 5 % 50 mL IVPB  Status:  Discontinued     1 g 100 mL/hr over 30 Minutes Intravenous Every 24 hours 09/21/2016 1602 09/09/2016 1603   09/08/2016 1615  vancomycin (VANCOCIN)  2,000 mg in sodium chloride 0.9 % 500 mL IVPB  Status:  Discontinued     2,000 mg 250 mL/hr over 120 Minutes Intravenous  Once 10-17-16 1605 17-Oct-2016 1607   10/17/16 1615  piperacillin-tazobactam (ZOSYN) IVPB 3.375 g  Status:  Discontinued      Comments:  CAP and sepsis. Per pharmacy dose   3.375 g 100 mL/hr over 30 Minutes Intravenous  Once Oct 17, 2016 1605 10/17/2016 1607   10/17/16 1100  piperacillin-tazobactam (ZOSYN) IVPB 3.375 g     3.375 g 100 mL/hr over 30 Minutes Intravenous  Once 10/17/2016 1051 Oct 17, 2016 1229   10-17-2016 1100  vancomycin (VANCOCIN) IVPB 1000 mg/200 mL premix  Status:  Discontinued     1,000 mg 200 mL/hr over 60 Minutes Intravenous  Once Oct 17, 2016 1051 10/17/16 1057   October 17, 2016 1100  vancomycin (VANCOCIN) 2,000 mg in sodium chloride 0.9 % 500 mL IVPB     2,000 mg 250 mL/hr over 120 Minutes Intravenous  Once 17-Oct-2016 1057 2016/10/17 1426      Procedures: None  CONSULTS:  cardiology and nephrology  Time spent: 25 minutes-Greater than 50% of this time was spent in counseling, explanation of diagnosis, planning of further management, and coordination of care.  MEDICATIONS: Scheduled Meds: . piperacillin-tazobactam (ZOSYN)  IV  3.375 g Intravenous Q12H  . sevelamer carbonate  800 mg Oral TID WC  . vancomycin  1,000 mg Intravenous Q T,Th,Sa-HD   Continuous Infusions: . sodium chloride 10 mL/hr at 09/25/16 0320   PRN Meds:.alum & mag hydroxide-simeth, dextrose, ipratropium-albuterol   PHYSICAL EXAM: Vital signs: Vitals:   09/25/16 1130 09/25/16 1200 09/25/16 1230 09/25/16 1300  BP: 103/60 (!) 87/51 (!) 84/41 (!) 86/48  Pulse: (!) 118 (!) 106 (!) 114 (!) 115  Resp: (!) 24 (!) 25 (!) 29 20  Temp:      TempSrc:      SpO2: 96% 94% 97% 97%  Weight:      Height:       Filed Weights   10/17/16 1705 09/25/16 0955  Weight: 89.8 kg (197 lb 15.6 oz) 89.5 kg (197 lb 5 oz)   Body mass index is 26.03 kg/m.   General appearance :Awake, alert, mildly tachypneic but easily speaking in full sentences.  Eyes:, pupils equally reactive to light and accomodation,no scleral icterus.Pink conjunctiva HEENT: Atraumatic and Normocephalic Neck: supple, no JVD. No cervical lymphadenopathy. No thyromegaly Resp:Good  air entry bilaterally, bibasilar rales up to CVS: S1 S2 regular, no murmurs.  GI: Bowel sounds present, soft-slightly distended due to ascites Extremities: B/L Lower Ext shows no edema, both legs are warm to touch Neurology:  speech clear,Non focal, sensation is grossly intact. Musculoskeletal:No digital cyanosis Skin:No Rash, warm and dry Wounds:N/A  I have personally reviewed following labs and imaging studies  LABORATORY DATA: CBC:  Recent Labs Lab 09/18/16 1652 17-Oct-2016 1120 17-Oct-2016 1815 09/25/16 0407  WBC 3.0* 12.0* 10.1 12.3*  NEUTROABS 1.8 11.1* 9.1*  --   HGB 11.9* 12.5* 12.3* 11.7*  HCT 34.8* 36.7* 35.6* 34.3*  MCV 79.3 79.3 78.8 79.2  PLT 72* 83* 85* 83*    Basic Metabolic Panel:  Recent Labs Lab 09/18/16 1652 10-17-2016 1120 2016-10-17 1815 09/25/16 0407  NA 138 136 136 138  K 4.9 5.6* 5.2* 5.4*  CL 100* 96* 98* 100*  CO2 24 21* 21* 24  GLUCOSE 110* 52* 42* 91  BUN 40* 73* 77* 84*  CREATININE 6.50* 8.82* 7.42* 9.22*  CALCIUM 9.6 9.3 9.2 9.2    GFR:  Estimated Creatinine Clearance: 9.3 mL/min (by C-G formula based on SCr of 9.22 mg/dL (H)).  Liver Function Tests:  Recent Labs Lab 09/18/16 1652 09/17/2016 1120 09/16/2016 1815 09/25/16 0407  AST 50* 74* 67* 62*  ALT 19 24 25 24   ALKPHOS 168* 141* 132* 126  BILITOT 3.3* 6.0* 6.1* 6.8*  PROT 8.1 7.8 7.2 7.4  ALBUMIN 2.8* 2.5* 2.3* 2.3*    Recent Labs Lab 09/18/16 1652  LIPASE 83*   No results for input(s): AMMONIA in the last 168 hours.  Coagulation Profile:  Recent Labs Lab 09/09/2016 1815  INR 1.53    Cardiac Enzymes:  Recent Labs Lab 09/13/2016 1815 09/12/2016 2144 09/25/16 0407 09/25/16 0708  TROPONINI 0.30* 0.25* 0.23* 0.21*    BNP (last 3 results) No results for input(s): PROBNP in the last 8760 hours.  HbA1C: No results for input(s): HGBA1C in the last 72 hours.  CBG:  Recent Labs Lab 09/17/2016 1933 09/30/2016 2023  GLUCAP 43* 73    Lipid Profile: No results for  input(s): CHOL, HDL, LDLCALC, TRIG, CHOLHDL, LDLDIRECT in the last 72 hours.  Thyroid Function Tests: No results for input(s): TSH, T4TOTAL, FREET4, T3FREE, THYROIDAB in the last 72 hours.  Anemia Panel: No results for input(s): VITAMINB12, FOLATE, FERRITIN, TIBC, IRON, RETICCTPCT in the last 72 hours.  Urine analysis:    Component Value Date/Time   COLORURINE YELLOW 05/03/2013 0603   APPEARANCEUR CLEAR 05/03/2013 0603   LABSPEC 1.012 05/03/2013 0603   PHURINE 8.5 (H) 05/03/2013 0603   GLUCOSEU NEGATIVE 05/03/2013 0603   HGBUR SMALL (A) 05/03/2013 0603   BILIRUBINUR NEGATIVE 05/03/2013 0603   KETONESUR NEGATIVE 05/03/2013 0603   PROTEINUR >300 (A) 05/03/2013 0603   UROBILINOGEN 0.2 05/03/2013 0603   NITRITE NEGATIVE 05/03/2013 0603   LEUKOCYTESUR NEGATIVE 05/03/2013 0603    Sepsis Labs: Lactic Acid, Venous    Component Value Date/Time   LATICACIDVEN 2.3 (HH) 09/25/2016 0708    MICROBIOLOGY: Recent Results (from the past 240 hour(s))  Anaerobic culture     Status: None   Collection Time: 09/19/16  3:22 PM  Result Value Ref Range Status   Specimen Description PERITONEAL  Final   Special Requests NONE  Final   Culture   Final    NO ANAEROBES ISOLATED Performed at [facility]    Report Status 09/23/2016 FINAL  Final  Body fluid culture     Status: None   Collection Time: 09/19/16  3:22 PM  Result Value Ref Range Status   Specimen Description PERITONEAL  Final   Special Requests NONE  Final   Gram Stain   Final    FEW WBC PRESENT, PREDOMINANTLY MONONUCLEAR NO ORGANISMS SEEN    Culture   Final    NO GROWTH 3 DAYS Performed at [facility], [address], [place], [place] [address]    Report Status 09/23/2016 FINAL  Final  Acid Fast Smear (AFB)     Status: None   Collection Time: 09/19/16  3:22 PM  Result Value Ref Range Status   AFB Specimen Processing Concentration  Final   Acid Fast Smear Negative  Final    Comment: (NOTE) Performed At: [facility] [address] [place], [place] [id number] Mila Homer MD [id number]    Source (AFB) PERITONEAL  Final    RADIOLOGY STUDIES/RESULTS: Dg Chest 2 View  Result Date: 09/25/2016 CLINICAL DATA:  Shortness of breath.  Cough and sepsis. EXAM: CHEST  2 VIEW COMPARISON:  10/02/2016; 09/18/2016; 03/23/2016 ;  11/13/2015; chest CT -11/13/2025 FINDINGS: Grossly unchanged enlarged cardiac silhouette and mediastinal contours with masslike prominence of the right hilum compatible with known mediastinal lymphadenopathy. Atherosclerotic plaque within the thoracic aorta. Ill-defined heterogeneous airspace opacities within the bilateral mid and lower lungs are unchanged the slightly progressed in the interval. Pulmonary vasculature remains indistinct. Suspected trace right-sided effusion, unchanged. No pneumothorax. Unchanged bones. IMPRESSION: 1. No change to slight progression of bilateral mid and lower lung heterogeneous airspace opacities with differential considerations including asymmetric alveolar pulmonary edema versus progression of multifocal infection, including atypical etiologies. Clinical correlation is advised. 2. Similar findings of mediastinal adenopathy, similar to remote chest CT performed 11/2015. 3. Cardiomegaly. 4.  Aortic Atherosclerosis (ICD10-170.0) Electronically Signed   By: Simonne Come M.D.   On: 09/25/2016 07:56   Dg Chest 2 View  Result Date: 09/18/2016 CLINICAL DATA:  Abdominal and chest pain EXAM: CHEST  2 VIEW COMPARISON:  Chest radiograph 03/23/2016 FINDINGS: There is massive cardiomegaly, unchanged, with calcification within the aortic arch. There are bilateral interstitial opacities likely indicating mild interstitial edema. No pneumothorax or pleural effusion. No focal airspace consolidation. IMPRESSION: 1. Massive cardiomegaly and aortic atherosclerosis. 2. Mild interstitial pulmonary edema. Electronically Signed   By: Deatra Robinson M.D.   On: 09/18/2016  23:52   Ct Head Wo Contrast  Result Date: 2016/10/10 CLINICAL DATA:  Altered mental status EXAM: CT HEAD WITHOUT CONTRAST TECHNIQUE: Contiguous axial images were obtained from the base of the skull through the vertex without intravenous contrast. COMPARISON:  03/24/2016 FINDINGS: Brain: No evidence of acute infarction, hemorrhage, hydrocephalus, extra-axial collection or mass lesion/mass effect. Periventricular white matter low attenuation as can be seen with microvascular disease. Generalized cerebral atrophy. Vascular: No hyperdense vessel or unexpected calcification. Skull: No osseous abnormality. Sinuses/Orbits: Mucous retention cyst in bilateral maxillary sinuses. Visualized mastoid sinuses are clear. Visualized orbits demonstrate no focal abnormality. Other: None IMPRESSION: 1. No acute intracranial pathology. Electronically Signed   By: Elige Ko   On: 10/10/2016 14:22   US Paracentesis  Result Date: 09/19/2016 INDICATION: Pt with history of ESRD, CAD,CHF, chronic liver disease by imaging, ascites; request received for diagnostic/therapeutic paracentesis. EXAM: ULTRASOUND GUIDED DIAGNOSTIC/THERAPEUTIC PARACENTESIS MEDICATIONS: None. COMPLICATIONS: None immediate. PROCEDURE: Informed written consent was obtained from the patient after a discussion of the risks, benefits and alternatives to treatment. A timeout was performed prior to the initiation of the procedure. Initial ultrasound scanning demonstrates a small amount of ascites within the right upper to mid abdominal quadrant. The right upper to mid abdomen was prepped and draped in the usual sterile fashion. 1% lidocaine was used for local anesthesia. Following this, a Yueh catheter was introduced. An ultrasound image was saved for documentation purposes. The paracentesis was performed. The catheter was removed and a dressing was applied. The patient tolerated the procedure well without immediate post procedural complication. FINDINGS: A total  of approximately 200 cc of hazy, blood-tinged/amber fluid was removed. Samples were sent to the laboratory for as ordered by primary team. IMPRESSION: Successful ultrasound-guided diagnostic/therapeutic paracentesis yielding 200 cc of peritoneal fluid. Despite pt's known abdominal fullness/discomfort only the above amount of fluid was noted on today's limited US abdomen. If symptoms persists consider CT abdomen/pelvis for further evaluation. Read by: Jeananne Rama, PA-C Electronically Signed   By: Irish Lack M.D.   On: 09/19/2016 11:50   Dg Chest Portable 1 View  Result Date: 10/10/16 CLINICAL DATA:  Cough, congestion, pneumonia, sepsis EXAM: PORTABLE CHEST 1 VIEW COMPARISON:  09/18/2016 FINDINGS: Mild patchy right lower  lobe opacity. No frank interstitial edema. No pleural effusion or pneumothorax. Cardiomegaly. IMPRESSION: Mild patchy right lower lobe opacity, atelectasis versus pneumonia. Electronically Signed   By: Charline Bills M.D.   On: 2016/09/29 14:51     LOS: 1 day   Jeoffrey Massed, MD  [facility] Pager:[phone]  If 7PM-7AM, please contact night-coverage [email or web address] Password [email or web address] 09/25/2016, 1:43 PM

## 2016-09-25 NOTE — Consult Note (Signed)
Reason for Consult: a fib    Referring Physician: Dr. Sloan Leiter   PCP:  Maximino Greenland, MD  Primary Cardiologist:Dr. Revonda Humphrey Milana Na El is an 66 y.o. male.    Chief Complaint: admitted 09/20/2016 with fever, cough, productive- acute respiratory failure from HCAP and Sepsis.     HPI:  Asked to see 87 yom with a history of HTN, ESRD on HD (T,TH,Sat), chronic A. Fib, prior history of DVT/PE, GERD, gout, combined systolic and diastolic heart failure, OSA, syncopy, hepatosplenomegaly, CAD status post remote MI in 1990, chronic abdominal pain.   With previous AFib he had DCCV 08/29/13 but developed a large hematoma on his back.  Has not been on anticoagulation since and has not wanted.  Previous Echos EF 25-30% by echo 09/04/14. Repeat echocardiogram done 11/13/15 showed improved in left ventricular function to 30-35%, diffuse hypokinesis, aortic root dilation to 39 mm, dilation of ascending aorta, moderately dilated right ventricle, moderately dilated right atrium, PA peak pressure of moderately increased to 55 mmHg. Trival pericardial effusion noted.  He had cardiac cath 11/15/15 with mid RCA 20% stenosed and normal lt circulation and severe LV dysfunction with EF 15-20%.  Difficult to add meds with HD.  Pt was supposed to receive 30 day event monitor for syncope but never rec'd.  No cardiology follow up.  Now presents yesterday with SOB, fever, cough, confusion and decreased sp02 to 80s.  He had lower ext edema.  (he had US paracentesis on the 12th as outpt.    EKG a fib wit RVR at 121 no changes from 11/2015  Labs:  Troponin 0.28, 0.30; 0.25; 0.23; 0.21  Lactic acid 3.77 WBC 12, plts 83 H/H 11.7/34.3 K+ 5.4, AST 62, Cr 9.22  CT head no acute abnormality CXR Mild patchy right lower lobe opacity, atelectasis versus pneumonia.    Currently VS with hypotension to 79/62  P 106-117 and rapid resp. 18-31.  On dialysis.  Stated he did not wish to follow up for his NICM.   He  tells me no chest pain unless he coughs.  His SOB ddi not occur until he had the fever.  Currently without complaints.   HR is improved control since admit with decreased/resolved fever.  He still does not wish to be on anticoagulants.      Past Medical History:  Diagnosis Date  . Acute on chronic systolic right heart failure ([place]) 11/14/2015  . Anemia   . Atrial fibrillation ([place])    not on coumadin due to large spontaneous hematoma on back and anemia  . Atrial flutter ([place]) 04/27/2013  . AVF (arteriovenous fistula) ([facility])    Left  . CHF (congestive heart failure) ([facility])    EF 20-25%  . Coronary artery disease   . Diverticulosis   . Dysrhythmia    afib  . Elevated troponin 11/14/2015  . ESRD (end stage renal disease) on dialysis ([place])    [facility] mon/wed/fri  . Exertional shortness of breath    "related to infection in my lungs right now" (05/03/2013)  . GERD (gastroesophageal reflux disease)   . Hepatosplenomegaly 10/18/2015   Workup per Dr Kaplan/ GI in Dec 2015 > abd pain improved after paracentesis x 2 (1.2L, 1.3L). Had negative hepA/ hep B/ hep C testing. For hepatosplenomagealy pt underwent transjugular liver biopsy by IR with hepatic vein wedge pressure measurement which showed elevated right heart and free hepatic venous pressures likely due to right  heart failure.  There was no evidence of portal hypertension. Liver bx originally was reported as "End stage liver disease/ cirrhosis", then was corrected > "No evidence of cirrhosis".  The corrected liver biopsy result reads "SINUSOIDAL DILATATION WITH SCATTERED FOCI OF HEPATITIS".  Last GI visit was Aug 2016 for bloating/ abd pain, noted he had a normal gastric empty study.  -Right lobe liver lesion. Felt to be hemangioma, Biopsy pending.  -thrombocytopenia. Dates back to 08/2013.  -ESRD. MWF HD.  -biventricular heart failure, acute right sided heart failure. Daily HD to address volume overload.    Marland Kitchen Hereditary and idiopathic peripheral  neuropathy 08/29/2015  . History of gout    "before I started doing the dialysis" (05/03/2013)  . Hypertension   . Hypertensive urgency    H/o  . Hypovitaminosis D   . Myocardial infarction 90's  . Peripheral vascular disease ([place])    dvt leg 12/14  . Pulmonary embolism ([place])    with right DVT secondary to recent surgery  . Rectal bleeding 11/2015  . Renal insufficiency   . S/P repair of ventral hernia 03/21/2014  . Secondary hyperparathyroidism ([place])   . Sleep apnea   . Syncope    felt secondary to residual anesthesia the day before - 2D echo unremarkable    Past Surgical History:  Procedure Laterality Date  . AV FISTULA PLACEMENT Left    Dr. Su Grand; "I've had 2 on the left' (05/03/2013)  . AV FISTULA PLACEMENT Right ~ 2011  . [place] REMOVAL Right 05/04/2013   Procedure: REMOVAL OF ARTERIOVENOUS Fistula Right Arm;  Surgeon: Elam Dutch, MD;  Location: [place];  Service: Vascular;  Laterality: Right;  . BASCILIC VEIN TRANSPOSITION Left 06/27/2013   Procedure: BASCILIC VEIN TRANSPOSITION;  Surgeon: Elam Dutch, MD;  Location: [place];  Service: Vascular;  Laterality: Left;  . CARDIAC CATHETERIZATION N/A 11/15/2015   Procedure: Right Heart Cath;  Surgeon: Troy Sine, MD;  Location: [place] CV LAB;  Service: Cardiovascular;  Laterality: N/A;  . CARDIAC CATHETERIZATION Right 11/15/2015   Procedure: Left Heart Cath;  Surgeon: Troy Sine, MD;  Location: [place] CV LAB;  Service: Cardiovascular;  Laterality: Right;  . CARDIOVERSION N/A 08/29/2013   Procedure: CARDIOVERSION;  Surgeon: Sueanne Margarita, MD;  Location: [facility] ENDOSCOPY;  Service: Cardiovascular;  Laterality: N/A;  . COLONOSCOPY  10-29-2005   Hx: of  . COLONOSCOPY N/A 11/30/2015   Procedure: COLONOSCOPY;  Surgeon: Ladene Artist, MD;  Location: [facility] ENDOSCOPY;  Service: Endoscopy;  Laterality: N/A;  . ESOPHAGOGASTRODUODENOSCOPY N/A 11/30/2015   Procedure: ESOPHAGOGASTRODUODENOSCOPY (EGD);  Surgeon: Ladene Artist, MD;   Location: [facility] ENDOSCOPY;  Service: Endoscopy;  Laterality: N/A;  . HERNIA REPAIR  0/10/93   Umbilical hernia-Dr. Rosendo Gros  . INSERTION OF DIALYSIS CATHETER Right 05/04/2013   Procedure: INSERTION OF DIALYSIS CATHETER;  Surgeon: Elam Dutch, MD;  Location: [place];  Service: Vascular;  Laterality: Right;  . INSERTION OF MESH N/A 03/21/2014   Procedure: INSERTION OF MESH;  Surgeon: Ralene Ok, MD;  Location: [place];  Service: General;  Laterality: N/A;  . KNEE ARTHROSCOPY Left   . REMOVAL OF A DIALYSIS CATHETER  2/15  . UMBILICAL HERNIA REPAIR N/A 03/21/2014   Procedure: LAPAROSCOPIC UMBILICAL HERNIA REPAIR WITH MESH;  Surgeon: Ralene Ok, MD;  Location: [place];  Service: General;  Laterality: N/A;  . VENOGRAM Left 05/26/2013   Procedure: VENOGRAM;  Surgeon: Conrad Fulton, MD;  Location: [facility] CATH LAB;  Service:  Cardiovascular;  Laterality: Left;    Family History  Problem Relation Age of Onset  . Hypertension Father   . Kidney disease Father   . Allergies Father   . Deep vein thrombosis Sister   . Pulmonary embolism Sister   . Diabetes Paternal Grandmother    Social History:  reports that he quit smoking about 44 years ago. His smoking use included Cigarettes. He has a 0.12 pack-year smoking history. He has never used smokeless tobacco. He reports that he does not drink alcohol or use drugs.  Allergies:  Allergies  Allergen Reactions  . Pork-Derived Products Other (See Comments)    No pork products for religious reasons    OUTPATIENT MEDICATIONS: No current facility-administered medications on file prior to encounter.    Current Outpatient Prescriptions on File Prior to Encounter  Medication Sig Dispense Refill  . albuterol (PROVENTIL HFA;VENTOLIN HFA) 108 (90 Base) MCG/ACT inhaler Inhale 2 puffs into the lungs every 4 (four) hours as needed for wheezing or shortness of breath.    . labetalol (NORMODYNE) 200 MG tablet Take 100 mg by mouth once a week.   0  .  oxyCODONE-acetaminophen (PERCOCET/ROXICET) 5-325 MG tablet Take 1 tablet by mouth every 6 (six) hours as needed for severe pain. Pain   0  . RENVELA 800 MG tablet Take by mouth 3 (three) times daily with meals. Take 1-4 tablets with each meal    . aspirin EC 325 MG EC tablet Take 1 tablet (325 mg total) by mouth daily. (Patient not taking: Reported on 09/19/2016) 30 tablet 0  . cephALEXin (KEFLEX) 250 MG capsule Take 1 capsule (250 mg total) by mouth 2 (two) times daily. (Patient not taking: Reported on 09/19/2016) 28 capsule 0  . fluticasone (FLONASE) 50 MCG/ACT nasal spray 1-2 Sprays per nostril daily as needed. (Patient not taking: Reported on 09/19/2016) 137 g 2  . hydrOXYzine (ATARAX/VISTARIL) 10 MG tablet Take 1 tablet (10 mg total) by mouth every 6 (six) hours as needed for itching. (Patient not taking: Reported on 09/19/2016) 60 tablet 0  . nitroGLYCERIN (NITROSTAT) 0.4 MG SL tablet Place 1 tablet (0.4 mg total) under the tongue every 5 (five) minutes as needed for chest pain. (Patient not taking: Reported on 09/23/2016) 15 tablet 0  . Nutritional Supplements (FEEDING SUPPLEMENT, NEPRO CARB STEADY,) LIQD Take 237 mLs by mouth 2 (two) times daily between meals. (Patient not taking: Reported on 09/19/2016) 60 Can 0   CURRENT MEDICATIONS: Scheduled Meds: . piperacillin-tazobactam (ZOSYN)  IV  3.375 g Intravenous Q12H  . sevelamer carbonate  800 mg Oral TID WC   Continuous Infusions: . sodium chloride 10 mL/hr at 09/25/16 0320   PRN Meds:.alum & mag hydroxide-simeth, dextrose, ipratropium-albuterol   Results for orders placed or performed during the hospital encounter of 09/22/2016 (from the past 48 hour(s))  Comprehensive metabolic panel     Status: Abnormal   Collection Time: 09/10/2016 11:20 AM  Result Value Ref Range   Sodium 136 135 - 145 mmol/L   Potassium 5.6 (H) 3.5 - 5.1 mmol/L   Chloride 96 (L) 101 - 111 mmol/L   CO2 21 (L) 22 - 32 mmol/L   Glucose, Bld 52 (L) 65 - 99 mg/dL   BUN 73  (H) 6 - 20 mg/dL   Creatinine, Ser 8.82 (H) 0.61 - 1.24 mg/dL   Calcium 9.3 8.9 - 10.3 mg/dL   Total Protein 7.8 6.5 - 8.1 g/dL   Albumin 2.5 (L) 3.5 - 5.0 g/dL  AST 74 (H) 15 - 41 U/L   ALT 24 17 - 63 U/L   Alkaline Phosphatase 141 (H) 38 - 126 U/L   Total Bilirubin 6.0 (H) 0.3 - 1.2 mg/dL   GFR calc non Af Amer 6 (L) >60 mL/min   GFR calc Af Amer 7 (L) >60 mL/min    Comment: (NOTE) The eGFR has been calculated using the CKD EPI equation. This calculation has not been validated in all clinical situations. eGFR's persistently <60 mL/min signify possible Chronic Kidney Disease.    Anion gap 19 (H) 5 - 15  Ethanol     Status: None   Collection Time: 10/07/2016 11:20 AM  Result Value Ref Range   Alcohol, Ethyl (B) <5 <5 mg/dL    Comment:        LOWEST DETECTABLE LIMIT FOR SERUM ALCOHOL IS 5 mg/dL FOR MEDICAL PURPOSES ONLY   Acetaminophen level     Status: Abnormal   Collection Time: 09/15/2016 11:20 AM  Result Value Ref Range   Acetaminophen (Tylenol), Serum <10 (L) 10 - 30 ug/mL    Comment:        THERAPEUTIC CONCENTRATIONS VARY SIGNIFICANTLY. A RANGE OF 10-30 ug/mL MAY BE AN EFFECTIVE CONCENTRATION FOR MANY PATIENTS. HOWEVER, SOME ARE BEST TREATED AT CONCENTRATIONS OUTSIDE THIS RANGE. ACETAMINOPHEN CONCENTRATIONS >150 ug/mL AT 4 HOURS AFTER INGESTION AND >50 ug/mL AT 12 HOURS AFTER INGESTION ARE OFTEN ASSOCIATED WITH TOXIC REACTIONS.   Salicylate level     Status: None   Collection Time: 09/30/2016 11:20 AM  Result Value Ref Range   Salicylate Lvl <6.2 2.8 - 30.0 mg/dL  CBC with Differential     Status: Abnormal   Collection Time: 09/19/2016 11:20 AM  Result Value Ref Range   WBC 12.0 (H) 4.0 - 10.5 K/uL   RBC 4.63 4.22 - 5.81 MIL/uL   Hemoglobin 12.5 (L) 13.0 - 17.0 g/dL   HCT 36.7 (L) 39.0 - 52.0 %   MCV 79.3 78.0 - 100.0 fL   MCH 27.0 26.0 - 34.0 pg   MCHC 34.1 30.0 - 36.0 g/dL   RDW 18.0 (H) 11.5 - 15.5 %   Platelets 83 (L) 150 - 400 K/uL    Comment: REPEATED  TO VERIFY PLATELET COUNT CONFIRMED BY SMEAR    Neutrophils Relative % 93 %   Neutro Abs 11.1 (H) 1.7 - 7.7 K/uL   Lymphocytes Relative 4 %   Lymphs Abs 0.5 (L) 0.7 - 4.0 K/uL   Monocytes Relative 3 %   Monocytes Absolute 0.4 0.1 - 1.0 K/uL   Eosinophils Relative 0 %   Eosinophils Absolute 0.0 0.0 - 0.7 K/uL   Basophils Relative 0 %   Basophils Absolute 0.0 0.0 - 0.1 K/uL  I-stat troponin, ED     Status: Abnormal   Collection Time: 09/18/2016 11:32 AM  Result Value Ref Range   Troponin i, poc 0.28 (HH) 0.00 - 0.08 ng/mL   Comment NOTIFIED PHYSICIAN    Comment 3            Comment: Due to the release kinetics of cTnI, a negative result within the first hours of the onset of symptoms does not rule out myocardial infarction with certainty. If myocardial infarction is still suspected, repeat the test at appropriate intervals.   I-Stat CG4 Lactic Acid, ED     Status: Abnormal   Collection Time: 09/28/2016 11:34 AM  Result Value Ref Range   Lactic Acid, Venous 3.77 (HH) 0.5 - 1.9 mmol/L  Comment NOTIFIED PHYSICIAN   Influenza panel by PCR (type A & B)     Status: None   Collection Time: 09/13/2016 11:45 AM  Result Value Ref Range   Influenza A By PCR NEGATIVE NEGATIVE   Influenza B By PCR NEGATIVE NEGATIVE    Comment: (NOTE) The Xpert Xpress Flu assay is intended as an aid in the diagnosis of  influenza and should not be used as a sole basis for treatment.  This  assay is FDA approved for nasopharyngeal swab specimens only. Nasal  washings and aspirates are unacceptable for Xpert Xpress Flu testing.   I-Stat venous blood gas, ED     Status: Abnormal   Collection Time: 09/12/2016 12:01 PM  Result Value Ref Range   pH, Ven 7.482 (H) 7.250 - 7.430   pCO2, Ven 36.3 (L) 44.0 - 60.0 mmHg   pO2, Ven 49.0 (H) 32.0 - 45.0 mmHg   Bicarbonate 27.2 20.0 - 28.0 mmol/L   TCO2 28 0 - 100 mmol/L   O2 Saturation 87.0 %   Acid-Base Excess 4.0 (H) 0.0 - 2.0 mmol/L   Patient temperature HIDE     Sample type VENOUS   Lactic acid, plasma     Status: Abnormal   Collection Time: 09/08/2016  2:40 PM  Result Value Ref Range   Lactic Acid, Venous 2.6 (HH) 0.5 - 1.9 mmol/L    Comment: CRITICAL RESULT CALLED TO, READ BACK BY AND VERIFIED WITH: N.STEPHENS,RN 09/30/2016 @ 1540 BY N.LIVINGSTON   CBC with Differential     Status: Abnormal   Collection Time: 10/06/2016  6:15 PM  Result Value Ref Range   WBC 10.1 4.0 - 10.5 K/uL   RBC 4.52 4.22 - 5.81 MIL/uL   Hemoglobin 12.3 (L) 13.0 - 17.0 g/dL   HCT 35.6 (L) 39.0 - 52.0 %   MCV 78.8 78.0 - 100.0 fL   MCH 27.2 26.0 - 34.0 pg   MCHC 34.6 30.0 - 36.0 g/dL   RDW 18.0 (H) 11.5 - 15.5 %   Platelets 85 (L) 150 - 400 K/uL    Comment: REPEATED TO VERIFY SPECIMEN CHECKED FOR CLOTS CONSISTENT WITH PREVIOUS RESULT    Neutrophils Relative % 91 %   Neutro Abs 9.1 (H) 1.7 - 7.7 K/uL   Lymphocytes Relative 4 %   Lymphs Abs 0.4 (L) 0.7 - 4.0 K/uL   Monocytes Relative 5 %   Monocytes Absolute 0.5 0.1 - 1.0 K/uL   Eosinophils Relative 1 %   Eosinophils Absolute 0.1 0.0 - 0.7 K/uL   Basophils Relative 0 %   Basophils Absolute 0.0 0.0 - 0.1 K/uL  Comprehensive metabolic panel     Status: Abnormal   Collection Time: 09/15/2016  6:15 PM  Result Value Ref Range   Sodium 136 135 - 145 mmol/L   Potassium 5.2 (H) 3.5 - 5.1 mmol/L   Chloride 98 (L) 101 - 111 mmol/L   CO2 21 (L) 22 - 32 mmol/L   Glucose, Bld 42 (LL) 65 - 99 mg/dL    Comment: CRITICAL RESULT CALLED TO, READ BACK BY AND VERIFIED WITH: K.DAVIS,RN 09/10/2016 _0  BY V.WILKINS    BUN 77 (H) 6 - 20 mg/dL   Creatinine, Ser 7.42 (H) 0.61 - 1.24 mg/dL   Calcium 9.2 8.9 - 10.3 mg/dL   Total Protein 7.2 6.5 - 8.1 g/dL   Albumin 2.3 (L) 3.5 - 5.0 g/dL   AST 67 (H) 15 - 41 U/L   ALT 25 17 - 63 U/L  Alkaline Phosphatase 132 (H) 38 - 126 U/L   Total Bilirubin 6.1 (H) 0.3 - 1.2 mg/dL   GFR calc non Af Amer 7 (L) >60 mL/min   GFR calc Af Amer 8 (L) >60 mL/min    Comment: (NOTE) The eGFR has been  calculated using the CKD EPI equation. This calculation has not been validated in all clinical situations. eGFR's persistently <60 mL/min signify possible Chronic Kidney Disease.    Anion gap 17 (H) 5 - 15  Lactic acid, plasma     Status: Abnormal   Collection Time: 09/09/2016  6:15 PM  Result Value Ref Range   Lactic Acid, Venous 2.6 (HH) 0.5 - 1.9 mmol/L    Comment: CRITICAL RESULT CALLED TO, READ BACK BY AND VERIFIED WITH: M.EL,RN 10/07/2016 _0  BY V.WILKINS   Procalcitonin     Status: None   Collection Time: 10/07/2016  6:15 PM  Result Value Ref Range   Procalcitonin 50.54 ng/mL    Comment:        Interpretation: PCT >= 10 ng/mL: Important systemic inflammatory response, almost exclusively due to severe bacterial sepsis or septic shock. (NOTE)         ICU PCT Algorithm               Non ICU PCT Algorithm    ----------------------------     ------------------------------         PCT < 0.25 ng/mL                 PCT < 0.1 ng/mL     Stopping of antibiotics            Stopping of antibiotics       strongly encouraged.               strongly encouraged.    ----------------------------     ------------------------------       PCT level decrease by               PCT < 0.25 ng/mL       >= 80% from peak PCT       OR PCT 0.25 - 0.5 ng/mL          Stopping of antibiotics                                             encouraged.     Stopping of antibiotics           encouraged.    ----------------------------     ------------------------------       PCT level decrease by              PCT >= 0.25 ng/mL       < 80% from peak PCT        AND PCT >= 0.5 ng/mL             Continuing antibiotics                                              encouraged.       Continuing antibiotics            encouraged.    ----------------------------     ------------------------------     PCT level increase  compared          PCT > 0.5 ng/mL         with peak PCT AND          PCT >= 0.5 ng/mL              Escalation of antibiotics                                          strongly encouraged.      Escalation of antibiotics        strongly encouraged.   Protime-INR     Status: Abnormal   Collection Time: 09/19/2016  6:15 PM  Result Value Ref Range   Prothrombin Time 18.6 (H) 11.4 - 15.2 seconds   INR 1.53   APTT     Status: Abnormal   Collection Time: 09/12/2016  6:15 PM  Result Value Ref Range   aPTT 46 (H) 24 - 36 seconds    Comment:        IF BASELINE aPTT IS ELEVATED, SUGGEST PATIENT RISK ASSESSMENT BE USED TO DETERMINE APPROPRIATE ANTICOAGULANT THERAPY.   Troponin I     Status: Abnormal   Collection Time: 09/28/2016  6:15 PM  Result Value Ref Range   Troponin I 0.30 (HH) <0.03 ng/mL    Comment: CRITICAL RESULT CALLED TO, READ BACK BY AND VERIFIED WITH: K.DAVIS,RN 09/29/2016 _0  BY V.WILKINS   Glucose, capillary     Status: Abnormal   Collection Time: 10/03/2016  7:33 PM  Result Value Ref Range   Glucose-Capillary 43 (LL) 65 - 99 mg/dL   Comment 1 Notify RN   Glucose, capillary     Status: None   Collection Time: 09/22/2016  8:23 PM  Result Value Ref Range   Glucose-Capillary 73 65 - 99 mg/dL   Comment 1 Notify RN   Troponin I     Status: Abnormal   Collection Time: 09/29/2016  9:44 PM  Result Value Ref Range   Troponin I 0.25 (HH) <0.03 ng/mL    Comment: CRITICAL VALUE NOTED.  VALUE IS CONSISTENT WITH PREVIOUSLY REPORTED AND CALLED VALUE.  Lactic acid, plasma     Status: Abnormal   Collection Time: 09/21/2016  9:44 PM  Result Value Ref Range   Lactic Acid, Venous 3.2 (HH) 0.5 - 1.9 mmol/L    Comment: CRITICAL RESULT CALLED TO, READ BACK BY AND VERIFIED WITH: WOODARD C,RN 09/11/2016 2306 WAYK   Troponin I     Status: Abnormal   Collection Time: 09/25/16  4:07 AM  Result Value Ref Range   Troponin I 0.23 (HH) <0.03 ng/mL    Comment: CRITICAL VALUE NOTED.  VALUE IS CONSISTENT WITH PREVIOUSLY REPORTED AND CALLED VALUE.  Comprehensive metabolic panel     Status: Abnormal    Collection Time: 09/25/16  4:07 AM  Result Value Ref Range   Sodium 138 135 - 145 mmol/L   Potassium 5.4 (H) 3.5 - 5.1 mmol/L   Chloride 100 (L) 101 - 111 mmol/L   CO2 24 22 - 32 mmol/L   Glucose, Bld 91 65 - 99 mg/dL   BUN 84 (H) 6 - 20 mg/dL   Creatinine, Ser 9.22 (H) 0.61 - 1.24 mg/dL   Calcium 9.2 8.9 - 10.3 mg/dL   Total Protein 7.4 6.5 - 8.1 g/dL   Albumin 2.3 (L) 3.5 - 5.0 g/dL   AST 62 (H)  15 - 41 U/L   ALT 24 17 - 63 U/L   Alkaline Phosphatase 126 38 - 126 U/L   Total Bilirubin 6.8 (H) 0.3 - 1.2 mg/dL   GFR calc non Af Amer 5 (L) >60 mL/min   GFR calc Af Amer 6 (L) >60 mL/min    Comment: (NOTE) The eGFR has been calculated using the CKD EPI equation. This calculation has not been validated in all clinical situations. eGFR's persistently <60 mL/min signify possible Chronic Kidney Disease.    Anion gap 14 5 - 15  CBC     Status: Abnormal   Collection Time: 09/25/16  4:07 AM  Result Value Ref Range   WBC 12.3 (H) 4.0 - 10.5 K/uL   RBC 4.33 4.22 - 5.81 MIL/uL   Hemoglobin 11.7 (L) 13.0 - 17.0 g/dL   HCT 34.3 (L) 39.0 - 52.0 %   MCV 79.2 78.0 - 100.0 fL   MCH 27.0 26.0 - 34.0 pg   MCHC 34.1 30.0 - 36.0 g/dL   RDW 17.9 (H) 11.5 - 15.5 %   Platelets 83 (L) 150 - 400 K/uL    Comment: CONSISTENT WITH PREVIOUS RESULT  Troponin I     Status: Abnormal   Collection Time: 09/25/16  7:08 AM  Result Value Ref Range   Troponin I 0.21 (HH) <0.03 ng/mL    Comment: CRITICAL VALUE NOTED.  VALUE IS CONSISTENT WITH PREVIOUSLY REPORTED AND CALLED VALUE.  Lactic acid, plasma     Status: Abnormal   Collection Time: 09/25/16  7:08 AM  Result Value Ref Range   Lactic Acid, Venous 2.3 (HH) 0.5 - 1.9 mmol/L    Comment: CRITICAL RESULT CALLED TO, READ BACK BY AND VERIFIED WITH: T.HOOD,RN 0830 09/25/16 CLARK,S    Dg Chest 2 View  Result Date: 09/25/2016 CLINICAL DATA:  Shortness of breath.  Cough and sepsis. EXAM: CHEST  2 VIEW COMPARISON:  09/23/2016; 09/18/2016; 03/23/2016 ;  11/13/2015; chest CT -11/13/2025 FINDINGS: Grossly unchanged enlarged cardiac silhouette and mediastinal contours with masslike prominence of the right hilum compatible with known mediastinal lymphadenopathy. Atherosclerotic plaque within the thoracic aorta. Ill-defined heterogeneous airspace opacities within the bilateral mid and lower lungs are unchanged the slightly progressed in the interval. Pulmonary vasculature remains indistinct. Suspected trace right-sided effusion, unchanged. No pneumothorax. Unchanged bones. IMPRESSION: 1. No change to slight progression of bilateral mid and lower lung heterogeneous airspace opacities with differential considerations including asymmetric alveolar pulmonary edema versus progression of multifocal infection, including atypical etiologies. Clinical correlation is advised. 2. Similar findings of mediastinal adenopathy, similar to remote chest CT performed 11/2015. 3. Cardiomegaly. 4.  Aortic Atherosclerosis (ICD10-170.0) Electronically Signed   By: Sandi Mariscal M.D.   On: 09/25/2016 07:56   Ct Head Wo Contrast  Result Date: 09/17/2016 CLINICAL DATA:  Altered mental status EXAM: CT HEAD WITHOUT CONTRAST TECHNIQUE: Contiguous axial images were obtained from the base of the skull through the vertex without intravenous contrast. COMPARISON:  03/24/2016 FINDINGS: Brain: No evidence of acute infarction, hemorrhage, hydrocephalus, extra-axial collection or mass lesion/mass effect. Periventricular white matter low attenuation as can be seen with microvascular disease. Generalized cerebral atrophy. Vascular: No hyperdense vessel or unexpected calcification. Skull: No osseous abnormality. Sinuses/Orbits: Mucous retention cyst in bilateral maxillary sinuses. Visualized mastoid sinuses are clear. Visualized orbits demonstrate no focal abnormality. Other: None IMPRESSION: 1. No acute intracranial pathology. Electronically Signed   By: Kathreen Devoid   On: 09/20/2016 14:22   Dg Chest  Portable 1 View  Result Date:  09/28/2016 CLINICAL DATA:  Cough, congestion, pneumonia, sepsis EXAM: PORTABLE CHEST 1 VIEW COMPARISON:  09/18/2016 FINDINGS: Mild patchy right lower lobe opacity. No frank interstitial edema. No pleural effusion or pneumothorax. Cardiomegaly. IMPRESSION: Mild patchy right lower lobe opacity, atelectasis versus pneumonia. Electronically Signed   By: Julian Hy M.D.   On: 09/29/2016 14:51    ROS: General:+ colds + fevers-febrile on admit now afebrile, + weight decrease Skin:no rashes or ulcers HEENT:no blurred vision, no congestion CV:see HPI PUL:see HPI GI:no diarrhea constipation or melena, no indigestion GU:no hematuria, no dysuria MS:no joint pain, no claudication Neuro:no syncope, no lightheadedness Endo:no diabetes, no thyroid disease   Blood pressure 92/69, pulse (!) 103, temperature 97.3 F (36.3 C), temperature source Oral, resp. rate 18, height _0  (1.854 m), weight 197 lb 15.6 oz (89.8 kg), SpO2 95 %.  Wt Readings from Last 3 Encounters:  09/13/2016 197 lb 15.6 oz (89.8 kg)  09/19/16 204 lb (92.5 kg)  04/15/16 203 lb (92.1 kg)    PE: General:Pleasant affect, NAD Skin:Warm and dry, brisk capillary refill HEENT:normocephalic, sclera clear, mucus membranes moist Neck:supple, no JVD, no bruits  Heart: irreg irreg without murmur, gallup, rub or click Lungs:diminished breath sounds in the bases without rales, rhonchi, or wheezes XBJ:YNWG, non tender, + BS, do not palpate liver spleen or masses Ext:no lower ext edema, 2+ pedal pulses, 2+ radial pulses Neuro:alert and oriented X 3, MAE, follows commands, + facial symmetry    Assessment/Plan Active Problems:   Disorder of phosphorus metabolism   Iron deficiency anemia   Essential hypertension   GERD (gastroesophageal reflux disease)   DVT of lower extremity (deep venous thrombosis) RLE   Pulmonary embolism (HCC)   DCM- EF 25-30% by echo 09/04/14   Ascites   Hepatic cirrhosis (HCC)    Hepatosplenomegaly   Chronic combined systolic and diastolic heart failure (HCC)   End-stage renal disease on hemodialysis (HCC)   Elevated troponin   Sepsis (HCC)   Acute respiratory failure with hypoxia (HCC)   Altered mental status   Chronic/permanent  a fib  Rate elevated but improving,  has been chronically difficult to give meds due to low BP, could add dig very low dose with dialysis and monitor.  CHA2DS2VASc score = 2.  Dr. Meda Coffee to see.   Elevated troponins with non obstructive CAD in March of this year.  troponins most likely elevated due to renal failure and sepsis  CAD  20 % RCA disease- last cath 11/2015   HCAP treated by IM on ABX   Hypotension with sepsis   NICM with last EF on cath 15-20% in 11/2015 but Echo at that time 30-35%. He has had low EF since 2015.    ESRD on HD followed by Renal.  Cecilie Kicks  Nurse Practitioner Certified [place] Pager [phone] or after 5pm or weekends call [phone] 09/25/2016, 9:23 AM   The patient was seen, examined and discussed with Cecilie Kicks, NP and I agree with the above.   66 yo male with ESRD on HD, chronic persistent A. Fib, prior history of DVT/PE, GERD, gout, combined systolic and diastolic heart failure, OSA, syncopy, hepatosplenomegaly, CAD status post remote MI in 1990, chronic abdominal pain.   With previous AFib he had DCCV 08/29/13 but developed a large hematoma on his back.  Has not been on anticoagulation since and has not wanted. TTE on 11/13/15 showed  LVEF 30-35%, diffuse hypokinesis, aortic root dilation to 39 mm, dilation of ascending  aorta, moderately dilated right ventricle, moderately dilated right atrium, PA peak pressure of moderately increased to 55 mmHg. Trival pericardial effusion noted. He had cardiac cath 11/15/15 with mid RCA 20%.  Difficult to add meds with HD.  Pt was supposed to receive 30 day event monitor for syncope but never rec'd.  No cardiology follow up.  The patient was  admitted with acute respiratory failure with hypoxia (URI) and fluid overload managed by HD. Also paracentesis on 09/19/16. He has been persistently in a-fib with RVR with ventricular rates in 120', BP down to low 80'. Troponin 0.28, 0.30; 0.25; 0.23; 0.21  Lactic acid 3.77 WBC 12, plts 83 H/H 11.7/34.3 K+ 5.4, AST 62, Cr 9.22  Plan: - start digoxin 0.125 mg iv x1 today, followed by 0.125 mg po daily, we will monitor closely as he is on HD, once rate controlled we will decrease to 0.0625 mg po daily. We are unable to use any AV nodal blocking agents sec to hypotension. Troponin elevation sec to demand ischemia and ESRD. No ischemic workup at this point. No heparin.  We will follow.   Ena Dawley, MD 09/25/2016

## 2016-09-25 NOTE — Progress Notes (Signed)
Pharmacy Antibiotic Note  Trevor [name] is a 66 y.o. male admitted on 09-29-16 with pneumonia and sepsis.   Continues on abx for sepsis/PNA. Tmax of 100.9 yesterday, WBC 12.3. Going to HD today  Plan: Start vancomycin 1g IV QHD-TTS Continue Zosyn 3.375g IV Q12 Monitor clinical picture, renal function, pre-HD VR prn F/U C&S, abx deescalation / LOT  Height: 6\' 1"  (185.4 cm) Weight: 197 lb 15.6 oz (89.8 kg) IBW/kg (Calculated) : 79.9  Temp (24hrs), Avg:98.6 F (37 C), Min:97.3 F (36.3 C), Max:100.9 F (38.3 C)   Recent Labs Lab 09/18/16 1652 09-29-16 1120 09/29/16 1134 2016-09-29 1440 09-29-16 1815 Sep 29, 2016 2144 09/25/16 0407 09/25/16 0708  WBC 3.0* 12.0*  --   --  10.1  --  12.3*  --   CREATININE 6.50* 8.82*  --   --  7.42*  --  9.22*  --   LATICACIDVEN  --   --  3.77* 2.6* 2.6* 3.2*  --  2.3*    Estimated Creatinine Clearance: 9.3 mL/min (by C-G formula based on SCr of 9.22 mg/dL (H)).    Allergies  Allergen Reactions  . Pork-Derived Products Other (See Comments)    No pork products for religious reasons   Antimicrobials this admission:  Zosyn 1/17 >>  Vancomycin 1/17 >>   Dose adjustments this admission:  n/a  Microbiology results:  1/17 BCx: sent 1/17 UCx: sent  1/17 Sputum: sent  1/17 Resp Panel: sent  Enzo Bi, PharmD, BCPS Clinical Pharmacist Pager [phone] 09/25/2016 10:37 AM

## 2016-09-25 NOTE — Progress Notes (Signed)
Trevor [name] El 07/02/1951  [id number]   Brief hx [name] is a 66yo pmh significant for ESRD on HD, htn, afib not on anticoagulation with hx of GIB,  CHF, chronic mild thrombocytopenia. Chronic liver disease with negative workup for cirrhosis including liver bx in 2015. Felt possibly r/t chronic congestion from right sided heart failure.   Called to floor this evening as pt confused and agitated. RN has pt on night before this and stated he was not confused. Wife reports intermittent confusion over the last month that seems worse after HD. CT head on 09/20/2016 with no abnormalities. Pt met sepsis criteria on admit and is being tx'd with empiric vancomycin and rocephin D2 for HCAP.  Subjective  Pt attempting to get out of bed stating he needs to get out of here. Not answering questions. Wife arrived at bedside during exam  Objective  Vitals:   09/25/16 1925 09/25/16 2116  BP: 99/60 (!) 83/64  Pulse:  (!) 114  Resp:  (!) 35  Temp: 99.3 F (37.4 C) 100.1 F (37.8 C)    General: awake, alert sitting up in bed in NAD, but confused CV: irregularly irregular, no murmur no LE edema Resp: bibasilar rales, no increased wob, no wheeze  GI: abdomen slightly distended but soft and nontender, BS+ Neuro: no focal deficits on exam. Pt uncooperative Psych: alert and oriented to name only  CBC    Component Value Date/Time   WBC 11.6 (H) 09/25/2016 2128   RBC 4.37 09/25/2016 2128   HGB 11.7 (L) 09/25/2016 2128   HGB 9.9 (L) 10/25/2009 0933   HCT 34.4 (L) 09/25/2016 2128   HCT 30.4 (L) 10/25/2009 0933   PLT 81 (L) 09/25/2016 2128   PLT 148 10/25/2009 0933   MCV 78.7 09/25/2016 2128   MCV 78.1 (L) 10/25/2009 0933   MCH 26.8 09/25/2016 2128   MCHC 34.0 09/25/2016 2128   RDW 17.7 (H) 09/25/2016 2128   RDW 14.7 (H) 10/25/2009 0933   LYMPHSABS 0.2 (L) 09/25/2016 2128   LYMPHSABS 0.7 (L) 10/25/2009 0933   MONOABS 0.7 09/25/2016 2128   MONOABS 0.5 10/25/2009 0933   EOSABS 0.1  09/25/2016 2128   EOSABS 0.2 10/25/2009 0933   BASOSABS 0.0 09/25/2016 2128   BASOSABS 0.0 10/25/2009 0933   CMP Latest Ref Rng & Units 09/25/2016 09/25/2016 09/19/2016  Glucose 65 - 99 mg/dL 111(H) 91 42(LL)  BUN 6 - 20 mg/dL 45(H) 84(H) 77(H)  Creatinine 0.61 - 1.24 mg/dL 6.11(H) 9.22(H) 7.42(H)  Sodium 135 - 145 mmol/L 134(L) 138 136  Potassium 3.5 - 5.1 mmol/L 4.5 5.4(H) 5.2(H)  Chloride 101 - 111 mmol/L 96(L) 100(L) 98(L)  CO2 22 - 32 mmol/L 24 24 21(L)  Calcium 8.9 - 10.3 mg/dL 8.9 9.2 9.2  Total Protein 6.5 - 8.1 g/dL 7.1 7.4 7.2  Total Bilirubin 0.3 - 1.2 mg/dL 6.7(H) 6.8(H) 6.1(H)  Alkaline Phos 38 - 126 U/L 113 126 132(H)  AST 15 - 41 U/L 53(H) 62(H) 67(H)  ALT 17 - 63 U/L _0 A/P  Acute Delirium -Pt appears nontoxic on exam, VSS. Continue current tx for HCAP. WBC actually improved -CT on admit unremarkable. No need to repeat for now -Will check cmet, NH3 -pt much calmer now with wife at bedside. Will try to avoid benzos/antipsych meds.  Patrici Ranks, NP-C [facility] [place]  pgr [phone]

## 2016-09-26 ENCOUNTER — Inpatient Hospital Stay: Payer: Medicare Other

## 2016-09-26 ENCOUNTER — Inpatient Hospital Stay (HOSPITAL_COMMUNITY): Payer: Medicare Other

## 2016-09-26 DIAGNOSIS — R410 Disorientation, unspecified: Secondary | ICD-10-CM

## 2016-09-26 DIAGNOSIS — J9601 Acute respiratory failure with hypoxia: Secondary | ICD-10-CM

## 2016-09-26 DIAGNOSIS — I5042 Chronic combined systolic (congestive) and diastolic (congestive) heart failure: Secondary | ICD-10-CM

## 2016-09-26 DIAGNOSIS — R6521 Severe sepsis with septic shock: Secondary | ICD-10-CM

## 2016-09-26 DIAGNOSIS — I4891 Unspecified atrial fibrillation: Secondary | ICD-10-CM

## 2016-09-26 DIAGNOSIS — Z87891 Personal history of nicotine dependence: Secondary | ICD-10-CM

## 2016-09-26 DIAGNOSIS — I428 Other cardiomyopathies: Secondary | ICD-10-CM

## 2016-09-26 DIAGNOSIS — Z992 Dependence on renal dialysis: Secondary | ICD-10-CM

## 2016-09-26 DIAGNOSIS — R188 Other ascites: Secondary | ICD-10-CM

## 2016-09-26 DIAGNOSIS — N186 End stage renal disease: Secondary | ICD-10-CM

## 2016-09-26 DIAGNOSIS — R4182 Altered mental status, unspecified: Secondary | ICD-10-CM

## 2016-09-26 DIAGNOSIS — J189 Pneumonia, unspecified organism: Secondary | ICD-10-CM

## 2016-09-26 DIAGNOSIS — A419 Sepsis, unspecified organism: Principal | ICD-10-CM

## 2016-09-26 DIAGNOSIS — Z9911 Dependence on respirator [ventilator] status: Secondary | ICD-10-CM

## 2016-09-26 DIAGNOSIS — I509 Heart failure, unspecified: Secondary | ICD-10-CM

## 2016-09-26 DIAGNOSIS — J1289 Other viral pneumonia: Secondary | ICD-10-CM

## 2016-09-26 LAB — RENAL FUNCTION PANEL
ALBUMIN: 2.2 g/dL — AB (ref 3.5–5.0)
ALBUMIN: 2.2 g/dL — AB (ref 3.5–5.0)
ANION GAP: 14 (ref 5–15)
ANION GAP: 13 (ref 5–15)
BUN: 51 mg/dL — ABNORMAL HIGH (ref 6–20)
BUN: 54 mg/dL — AB (ref 6–20)
CALCIUM: 9.3 mg/dL (ref 8.9–10.3)
CO2: 25 mmol/L (ref 22–32)
CO2: 25 mmol/L (ref 22–32)
CREATININE: 6.79 mg/dL — AB (ref 0.61–1.24)
Calcium: 9 mg/dL (ref 8.9–10.3)
Chloride: 96 mmol/L — ABNORMAL LOW (ref 101–111)
Chloride: 97 mmol/L — ABNORMAL LOW (ref 101–111)
Creatinine, Ser: 6.46 mg/dL — ABNORMAL HIGH (ref 0.61–1.24)
GFR calc Af Amer: 9 mL/min — ABNORMAL LOW (ref >60)
GFR calc Af Amer: 9 mL/min — ABNORMAL LOW (ref >60)
GFR calc non Af Amer: 8 mL/min — ABNORMAL LOW (ref >60)
GFR, EST NON AFRICAN AMERICAN: 8 mL/min — AB (ref >60)
GLUCOSE: 108 mg/dL — AB (ref 65–99)
GLUCOSE: 92 mg/dL (ref 65–99)
PHOSPHORUS: 4 mg/dL (ref 2.5–4.6)
POTASSIUM: 4.2 mmol/L (ref 3.5–5.1)
Phosphorus: 4.1 mg/dL (ref 2.5–4.6)
Potassium: 4.1 mmol/L (ref 3.5–5.1)
Sodium: 135 mmol/L (ref 135–145)
Sodium: 135 mmol/L (ref 135–145)

## 2016-09-26 LAB — POCT I-STAT 3, ART BLOOD GAS (G3+)
Acid-Base Excess: 6 mmol/L — ABNORMAL HIGH (ref 0.0–2.0)
BICARBONATE: 29.9 mmol/L — AB (ref 20.0–28.0)
O2 Saturation: 100 %
PCO2 ART: 40.6 mmHg (ref 32.0–48.0)
TCO2: 31 mmol/L (ref 0–100)
pH, Arterial: 7.475 — ABNORMAL HIGH (ref 7.350–7.450)
pO2, Arterial: 312 mmHg — ABNORMAL HIGH (ref 83.0–108.0)

## 2016-09-26 LAB — ECHOCARDIOGRAM COMPLETE
Height: 73 in
Weight: 3174.62 oz

## 2016-09-26 LAB — GLUCOSE, CAPILLARY
GLUCOSE-CAPILLARY: 109 mg/dL — AB (ref 65–99)
GLUCOSE-CAPILLARY: 58 mg/dL — AB (ref 65–99)
GLUCOSE-CAPILLARY: 69 mg/dL (ref 65–99)
GLUCOSE-CAPILLARY: 75 mg/dL (ref 65–99)
GLUCOSE-CAPILLARY: 82 mg/dL (ref 65–99)
GLUCOSE-CAPILLARY: 88 mg/dL (ref 65–99)
Glucose-Capillary: 61 mg/dL — ABNORMAL LOW (ref 65–99)
Glucose-Capillary: 91 mg/dL (ref 65–99)

## 2016-09-26 LAB — CBC
HCT: 36.1 % — ABNORMAL LOW (ref 39.0–52.0)
HEMATOCRIT: 34.7 % — AB (ref 39.0–52.0)
HEMOGLOBIN: 11.8 g/dL — AB (ref 13.0–17.0)
HEMOGLOBIN: 12.3 g/dL — AB (ref 13.0–17.0)
MCH: 26.8 pg (ref 26.0–34.0)
MCH: 27 pg (ref 26.0–34.0)
MCHC: 34 g/dL (ref 30.0–36.0)
MCHC: 34.1 g/dL (ref 30.0–36.0)
MCV: 78.9 fL (ref 78.0–100.0)
MCV: 79.2 fL (ref 78.0–100.0)
Platelets: 72 10*3/uL — ABNORMAL LOW (ref 150–400)
Platelets: 76 10*3/uL — ABNORMAL LOW (ref 150–400)
RBC: 4.4 MIL/uL (ref 4.22–5.81)
RBC: 4.56 MIL/uL (ref 4.22–5.81)
RDW: 17.6 % — ABNORMAL HIGH (ref 11.5–15.5)
RDW: 17.6 % — AB (ref 11.5–15.5)
WBC: 11.2 10*3/uL — ABNORMAL HIGH (ref 4.0–10.5)
WBC: 11.6 10*3/uL — ABNORMAL HIGH (ref 4.0–10.5)

## 2016-09-26 LAB — PROCALCITONIN: Procalcitonin: 37.98 ng/mL

## 2016-09-26 LAB — LACTIC ACID, PLASMA
LACTIC ACID, VENOUS: 2.7 mmol/L — AB (ref 0.5–1.9)
Lactic Acid, Venous: 2.1 mmol/L (ref 0.5–1.9)

## 2016-09-26 LAB — TROPONIN I
TROPONIN I: 0.21 ng/mL — AB (ref <0.03)
Troponin I: 0.31 ng/mL (ref <0.03)
Troponin I: 0.18 ng/mL (ref <0.03)

## 2016-09-26 LAB — MRSA PCR SCREENING: MRSA by PCR: NEGATIVE

## 2016-09-26 LAB — CORTISOL: CORTISOL PLASMA: 23.2 ug/dL

## 2016-09-26 MED ORDER — FENTANYL CITRATE (PF) 100 MCG/2ML IJ SOLN
INTRAMUSCULAR | Status: AC
  Filled 2016-09-26: qty 2

## 2016-09-26 MED ORDER — PHENYLEPHRINE HCL 10 MG/ML IJ SOLN
30.0000 ug/min | INTRAVENOUS | Status: DC
  Filled 2016-09-26: qty 1

## 2016-09-26 MED ORDER — ALBUMIN HUMAN 25 % IV SOLN
25.0000 g | Freq: Once | INTRAVENOUS | Status: AC
  Administered 2016-09-26: 25 g via INTRAVENOUS

## 2016-09-26 MED ORDER — DIGOXIN 0.0625 MG HALF TABLET
0.0625 mg | ORAL_TABLET | Freq: Every day | ORAL | Status: DC
  Administered 2016-09-27: 0.0625 mg
  Filled 2016-09-26 (×2): qty 1

## 2016-09-26 MED ORDER — LIDOCAINE HCL (PF) 1 % IJ SOLN: 5.0000 mL | INTRAMUSCULAR | Status: DC | PRN

## 2016-09-26 MED ORDER — LEVOFLOXACIN IN D5W 750 MG/150ML IV SOLN
750.0000 mg | Freq: Once | INTRAVENOUS | Status: AC
  Administered 2016-09-26: 750 mg via INTRAVENOUS
  Filled 2016-09-26: qty 150

## 2016-09-26 MED ORDER — DEXTROSE-NACL 5-0.9 % IV SOLN
INTRAVENOUS | Status: DC
  Administered 2016-09-26 – 2016-10-03 (×7): via INTRAVENOUS

## 2016-09-26 MED ORDER — LEVOFLOXACIN IN D5W 500 MG/100ML IV SOLN
500.0000 mg | INTRAVENOUS | Status: DC
  Filled 2016-09-26: qty 100

## 2016-09-26 MED ORDER — FENTANYL CITRATE (PF) 100 MCG/2ML IJ SOLN
50.0000 ug | Freq: Once | INTRAMUSCULAR | Status: AC
  Administered 2016-09-26: 50 ug via INTRAVENOUS

## 2016-09-26 MED ORDER — PENTAFLUOROPROP-TETRAFLUOROETH EX AERO: 1.0000 "application " | INHALATION_SPRAY | CUTANEOUS | Status: DC | PRN

## 2016-09-26 MED ORDER — ALTEPLASE 2 MG IJ SOLR: 2.0000 mg | Freq: Once | INTRAMUSCULAR | Status: DC | PRN

## 2016-09-26 MED ORDER — SODIUM CHLORIDE 0.9 % IV SOLN: 100.0000 mL | INTRAVENOUS | Status: DC | PRN

## 2016-09-26 MED ORDER — ALBUMIN HUMAN 25 % IV SOLN
INTRAVENOUS | Status: AC
  Filled 2016-09-26: qty 100

## 2016-09-26 MED ORDER — SODIUM CHLORIDE 0.9 % IV SOLN
30.0000 ug/min | INTRAVENOUS | Status: DC
  Administered 2016-09-26 – 2016-09-27 (×2): 100 ug/min via INTRAVENOUS
  Administered 2016-09-27: 125 ug/min via INTRAVENOUS
  Administered 2016-09-27: 100 ug/min via INTRAVENOUS
  Administered 2016-09-28: 180 ug/min via INTRAVENOUS
  Administered 2016-09-28: 175 ug/min via INTRAVENOUS
  Administered 2016-09-28: 160 ug/min via INTRAVENOUS
  Administered 2016-09-28 (×3): 180 ug/min via INTRAVENOUS
  Administered 2016-09-29: 120 ug/min via INTRAVENOUS
  Administered 2016-09-29: 140 ug/min via INTRAVENOUS
  Administered 2016-09-29: 180 ug/min via INTRAVENOUS
  Administered 2016-09-29: 160 ug/min via INTRAVENOUS
  Administered 2016-09-29: 170 ug/min via INTRAVENOUS
  Administered 2016-09-30: 200 ug/min via INTRAVENOUS
  Filled 2016-09-26 (×17): qty 4

## 2016-09-26 MED ORDER — SODIUM CHLORIDE 0.9 % IV BOLUS (SEPSIS)
500.0000 mL | Freq: Once | INTRAVENOUS | Status: AC
  Administered 2016-09-26: 500 mL via INTRAVENOUS

## 2016-09-26 MED ORDER — DIGOXIN 125 MCG PO TABS: 0.1250 mg | ORAL_TABLET | Freq: Every day | ORAL | Status: DC

## 2016-09-26 MED ORDER — SENNOSIDES 8.8 MG/5ML PO SYRP
5.0000 mL | ORAL_SOLUTION | Freq: Two times a day (BID) | ORAL | Status: DC | PRN
  Filled 2016-09-26: qty 5

## 2016-09-26 MED ORDER — DEXTROSE 50 % IV SOLN
INTRAVENOUS | Status: AC
  Filled 2016-09-26: qty 50

## 2016-09-26 MED ORDER — SODIUM CHLORIDE 0.9 % IV SOLN: INTRAVENOUS | Status: DC | PRN

## 2016-09-26 MED ORDER — ETOMIDATE 2 MG/ML IV SOLN
20.0000 mg | Freq: Once | INTRAVENOUS | Status: AC
  Administered 2016-09-26: 20 mg via INTRAVENOUS

## 2016-09-26 MED ORDER — MIDODRINE HCL 5 MG PO TABS
10.0000 mg | ORAL_TABLET | Freq: Three times a day (TID) | ORAL | Status: DC
  Administered 2016-09-26: 10 mg via ORAL
  Filled 2016-09-26 (×2): qty 2

## 2016-09-26 MED ORDER — FENTANYL BOLUS VIA INFUSION
50.0000 ug | INTRAVENOUS | Status: DC | PRN
  Administered 2016-10-01: 50 ug via INTRAVENOUS
  Filled 2016-09-26: qty 50

## 2016-09-26 MED ORDER — DEXTROSE 50 % IV SOLN
25.0000 mL | Freq: Once | INTRAVENOUS | Status: AC
  Administered 2016-09-26: 25 mL via INTRAVENOUS

## 2016-09-26 MED ORDER — BISACODYL 10 MG RE SUPP: 10.0000 mg | Freq: Every day | RECTAL | Status: DC | PRN

## 2016-09-26 MED ORDER — CHLORHEXIDINE GLUCONATE 0.12% ORAL RINSE (MEDLINE KIT)
15.0000 mL | Freq: Two times a day (BID) | OROMUCOSAL | Status: DC
Start: 2016-09-26 — End: 2016-10-06
  Administered 2016-09-26 – 2016-10-05 (×18): 15 mL via OROMUCOSAL

## 2016-09-26 MED ORDER — MIDODRINE HCL 5 MG PO TABS
ORAL_TABLET | ORAL | Status: AC
  Filled 2016-09-26: qty 2

## 2016-09-26 MED ORDER — ROCURONIUM BROMIDE 50 MG/5ML IV SOLN
50.0000 mg | Freq: Once | INTRAVENOUS | Status: AC
  Administered 2016-09-26: 50 mg via INTRAVENOUS

## 2016-09-26 MED ORDER — FAMOTIDINE IN NACL 20-0.9 MG/50ML-% IV SOLN
20.0000 mg | INTRAVENOUS | Status: DC
  Administered 2016-09-26 – 2016-09-27 (×2): 20 mg via INTRAVENOUS
  Filled 2016-09-26 (×3): qty 50

## 2016-09-26 MED ORDER — VANCOMYCIN HCL IN DEXTROSE 1-5 GM/200ML-% IV SOLN
1000.0000 mg | Freq: Once | INTRAVENOUS | Status: AC
  Administered 2016-09-26: 1000 mg via INTRAVENOUS
  Filled 2016-09-26 (×2): qty 200

## 2016-09-26 MED ORDER — DOXERCALCIFEROL 4 MCG/2ML IV SOLN
INTRAVENOUS | Status: AC
  Filled 2016-09-26: qty 2

## 2016-09-26 MED ORDER — SEVELAMER CARBONATE 800 MG PO TABS
800.0000 mg | ORAL_TABLET | Freq: Three times a day (TID) | ORAL | Status: DC
  Administered 2016-09-27 – 2016-09-30 (×8): 800 mg
  Filled 2016-09-26 (×12): qty 1

## 2016-09-26 MED ORDER — ORAL CARE MOUTH RINSE
15.0000 mL | Freq: Four times a day (QID) | OROMUCOSAL | Status: DC
  Administered 2016-09-27 – 2016-10-05 (×35): 15 mL via OROMUCOSAL

## 2016-09-26 MED ORDER — MIDODRINE HCL 5 MG PO TABS
10.0000 mg | ORAL_TABLET | Freq: Three times a day (TID) | ORAL | Status: DC
  Administered 2016-09-27 – 2016-10-05 (×25): 10 mg
  Filled 2016-09-26 (×28): qty 2

## 2016-09-26 MED ORDER — MIDAZOLAM HCL 2 MG/2ML IJ SOLN
1.0000 mg | Freq: Once | INTRAMUSCULAR | Status: AC
  Administered 2016-09-26: 1 mg via INTRAVENOUS

## 2016-09-26 MED ORDER — SODIUM CHLORIDE 0.9 % IV SOLN
25.0000 ug/h | INTRAVENOUS | Status: DC
  Administered 2016-09-26: 50 ug/h via INTRAVENOUS
  Administered 2016-09-27: 200 ug/h via INTRAVENOUS
  Administered 2016-09-27: 400 ug/h via INTRAVENOUS
  Administered 2016-09-28 (×2): 300 ug/h via INTRAVENOUS
  Administered 2016-09-29: 250 ug/h via INTRAVENOUS
  Administered 2016-09-29: 175 ug/h via INTRAVENOUS
  Administered 2016-09-30 – 2016-10-01 (×2): 150 ug/h via INTRAVENOUS
  Administered 2016-10-01: 175 ug/h via INTRAVENOUS
  Administered 2016-10-02: 150 ug/h via INTRAVENOUS
  Administered 2016-10-03 (×2): 175 ug/h via INTRAVENOUS
  Filled 2016-09-26 (×15): qty 50

## 2016-09-26 MED ORDER — SODIUM CHLORIDE 0.9 % IV SOLN
30.0000 ug/min | INTRAVENOUS | Status: DC
  Administered 2016-09-26: 120 ug/min via INTRAVENOUS
  Administered 2016-09-26: 50 ug/min via INTRAVENOUS
  Filled 2016-09-26 (×4): qty 1

## 2016-09-26 MED ORDER — LIDOCAINE-PRILOCAINE 2.5-2.5 % EX CREA
1.0000 "application " | TOPICAL_CREAM | CUTANEOUS | Status: DC | PRN
  Filled 2016-09-26: qty 5

## 2016-09-26 NOTE — Procedures (Signed)
Intubation Procedure Note Trevor [name] [id number] [birth year]  Procedure: Intubation/ tube change Indications: for bronchoscopy   Procedure Details Consent: Risks of procedure as well as the alternatives and risks of each were explained to the (patient/caregiver).  Consent for procedure obtained. Time Out: Verified patient identification, verified procedure, site/side was marked, verified correct patient position, special equipment/implants available, medications/allergies/relevent history reviewed, required imaging and test results available.  Performed  Maximum sterile technique was used including antiseptics, cap, gloves, hand hygiene and mask.    Tube changer passed thru the 7.5 ETT, tube removed, then # 8 ETT passed easily over the tube changer.   Verified w/ 4 glide scope   Evaluation Hemodynamic Status: BP stable throughout; O2 sats: stable throughout Patient's Current Condition: stable Complications: No apparent complications Patient did tolerate procedure well. Chest X-ray ordered to verify placement.  CXR: tube position acceptable.   Trevor [name] 09/26/2016 Trevor [name] ACNP-BC [facility] Pulmonary/Critical Care Pager # [phone] OR # [phone] if no answer  Trevor [name], M.D. [facility] Pulmonary/Critical Care Medicine. Pager: [phone]. After hours pager: [phone].

## 2016-09-26 NOTE — Progress Notes (Signed)

## 2016-09-26 NOTE — Procedures (Signed)
Intubation Procedure Note Trevor [name] [id number] 06-09-1951  Procedure: Intubation Indications: Airway protection and maintenance  Procedure Details Consent: Risks of procedure as well as the alternatives and risks of each were explained to the (patient/caregiver).  Consent for procedure obtained. Time Out: Verified patient identification, verified procedure, site/side was marked, verified correct patient position, special equipment/implants available, medications/allergies/relevent history reviewed, required imaging and test results available.  Performed  Maximum sterile technique was used including antiseptics, cap, gloves, gown, hand hygiene, mask and sheet.  MAC    Evaluation Hemodynamic Status: BP stable throughout; O2 sats: stable throughout Patient's Current Condition: stable Complications: No apparent complications Patient did tolerate procedure well. Chest X-ray ordered to verify placement.  CXR: pending.  Hayden Pedro, AG-ACNP [place] [facility]  Pgr: [phone]  PCCM Pgr: [phone]  Rush Farmer, M.D. [facility] Pulmonary/Critical Care Medicine. Pager: [phone]. After hours pager: [phone].

## 2016-09-26 NOTE — Progress Notes (Addendum)
PROGRESS NOTE        PATIENT DETAILS Name: Trevor [name] El Age: 66 y.o. Sex: male Date of Birth: 1950-11-07 Admit Date: 09/12/2016 Admitting Physician Ozella Rocks, MD ZOX:[id number] Kennon Portela, MD  Brief Narrative: Patient is a 66 y.o. male with prior study of chronic systolic heart failure, atrial fibrillation not on anticoagulation by patient's choice (per prior notes had refused) presented to the ED on 1/17 with sepsis with hypotension due to HCAP.See below for details.  Subjective: Confused-still hypoxic on 5L of O2 ( even after HD X 2).Hypoglycemic last night.  Assessment/Plan: Sepsis with hypotension due to healthcare associated pneumonia: Remains essentially unchanged-BP continues to be soft. Resp Virus panel + Corona virus. Blood cultures as outpatient on 1/15-+ for gm + cocci, but blood cultures on 1/17 negative so far. Since remains hypoxic/encephalopathic will get PCCM input. Started Midodrine on 1/18.  Acute hypoxic respiratory failure: Suspect related to HCAP/Corona Virus-no improvement in hypoxia inspite of full HD on 1/18 and 1/19. Remains tachypneic-but not in acute distress. Given encephalopathy-will be difficult to use BiPAP if needed. Will get CCM input.  Acute metabolic encephalopathy: Likely secondary to hypoxia and sepsis. Initially had improved, but became more confused last night-and remains confused this morning. CT head on 1/17 was negative for acute abnormalities  A. fib RVR: Heart rate fluctuating-remains as high as 140s this morning-given soft blood pressure-unable to use beta blocker/CCB.Cards consulted-started on low dose Digoxin. Continue to monitor in telemetry.Reviewed prior notes-patient has had GI bleeding in the past-and has elected not to resume anticoagulation.  Elevated troponin levels: Trend is flat-not consistent with ACS. Likely secondary to demand ischemia and CKD  Hypoglycemia:multifactorial-has underlying ? Cardiac  cirrhoses-worsened by sepsis. Start D5 0.9 NS and follow.   Chronic systolic heart failure (EF 30-35% on March 2017): Volume removal during hemodialysis-hold all antihypertensives given soft blood pressure.  Mild hyperkalemia: resolved with HD  ESRD: On HD-TTS-nephrology following  Thrombocytopenia: Chronic issue, likely worsened due to sepsis. Follow.  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, hepatosplenomegaly and thrombocytopenia:   DVT Prophylaxis: SCD's-given thrombocytopenia  Code Status: Full code   Family Communication: Nonee at bedside  Disposition Plan: Remain inpatient-will require several days of hospitalization prior to discharge  Antimicrobial agents: Anti-infectives    Start     Dose/Rate Route Frequency Ordered Stop   09/26/16 1400  vancomycin (VANCOCIN) IVPB 1000 mg/200 mL premix     1,000 mg 200 mL/hr over 60 Minutes Intravenous  Once 09/26/16 1233     09/25/16 1200  vancomycin (VANCOCIN) IVPB 1000 mg/200 mL premix  Status:  Discontinued     1,000 mg 200 mL/hr over 60 Minutes Intravenous Every T-Th-Sa (Hemodialysis) 09/25/16 1037 09/26/16 1234   09/25/16 1200  vancomycin (VANCOCIN) 1-5 GM/200ML-% IVPB    Comments:  Serrano, Salvador   : cabinet override      09/25/16 1200 09/25/16 1322   09/12/2016 2200  piperacillin-tazobactam (ZOSYN) IVPB 3.375 g     3.375 g 12.5 mL/hr over 240 Minutes Intravenous Every 12 hours 09/18/2016 1059     09/21/2016 2200  azithromycin (ZITHROMAX) 500 mg in dextrose 5 % 250 mL IVPB  Status:  Discontinued     500 mg 250 mL/hr over 60 Minutes Intravenous Every 24 hours 10/07/2016 1602 10/01/2016 1606  10/04/2016 1615  cefTRIAXone (ROCEPHIN) 1 g in dextrose 5 % 50 mL IVPB  Status:  Discontinued     1 g 100 mL/hr over 30 Minutes Intravenous Every 24 hours 09/09/2016 1602 10/07/2016 1603   09/26/2016 1615  vancomycin (VANCOCIN) 2,000 mg in sodium chloride 0.9  % 500 mL IVPB  Status:  Discontinued     2,000 mg 250 mL/hr over 120 Minutes Intravenous  Once 09/22/2016 1605 09/29/2016 1607   09/19/2016 1615  piperacillin-tazobactam (ZOSYN) IVPB 3.375 g  Status:  Discontinued    Comments:  CAP and sepsis. Per pharmacy dose   3.375 g 100 mL/hr over 30 Minutes Intravenous  Once 09/17/2016 1605 09/18/2016 1607   09/14/2016 1100  piperacillin-tazobactam (ZOSYN) IVPB 3.375 g     3.375 g 100 mL/hr over 30 Minutes Intravenous  Once 09/10/2016 1051 09/11/2016 1229   09/27/2016 1100  vancomycin (VANCOCIN) IVPB 1000 mg/200 mL premix  Status:  Discontinued     1,000 mg 200 mL/hr over 60 Minutes Intravenous  Once 09/27/2016 1051 10/01/2016 1057   09/26/2016 1100  vancomycin (VANCOCIN) 2,000 mg in sodium chloride 0.9 % 500 mL IVPB     2,000 mg 250 mL/hr over 120 Minutes Intravenous  Once 09/18/2016 1057 09/11/2016 1426      Procedures: None  CONSULTS:  cardiology and nephrology  Time spent: 25 minutes-Greater than 50% of this time was spent in counseling, explanation of diagnosis, planning of further management, and coordination of care.  MEDICATIONS: Scheduled Meds: . digoxin  0.125 mg Oral Daily  . doxercalciferol  3 mcg Intravenous Q M,W,F-HD  . guaiFENesin  1,200 mg Oral BID  . midodrine  10 mg Oral TID WC  . piperacillin-tazobactam (ZOSYN)  IV  3.375 g Intravenous Q12H  . sevelamer carbonate  800 mg Oral TID WC  . vancomycin  1,000 mg Intravenous Once   Continuous Infusions: . sodium chloride 10 mL/hr at 09/25/16 0320   PRN Meds:.acetaminophen, albuterol, dextrose, ipratropium-albuterol   PHYSICAL EXAM: Vital signs: Vitals:   09/26/16 0930 09/26/16 1000 09/26/16 1030 09/26/16 1202  BP: (!) 96/46 (!) 110/53 112/60 101/62  Pulse: 100 (!) 104 (!) 110 (!) 111  Resp: 18  20 (!) 25  Temp:      TempSrc:      SpO2:    91%  Weight:      Height:       Filed Weights   09/25/16 0955 09/25/16 1412 09/26/16 0728  Weight: 89.5 kg (197 lb 5 oz) 86 kg (189 lb 9.5 oz) 90  kg (198 lb 6.6 oz)   Body mass index is 26.18 kg/m.   General appearance :Confused- tachypneic-but not in acute distress Eyes:, pupils equally reactive to light and accomodation,no scleral icterus. HEENT: Atraumatic and Normocephalic Neck: supple, no JVD. No cervical lymphadenopathy. No thyromegaly Resp:Good air entry bilaterally, bibasilar rales up to mid lung CVS: S1 S2 regular, no murmurs.  GI: Bowel sounds present, soft-slightly distended due to ascites Extremities: B/L Lower Ext shows no edema, both legs are warm to touch Neurology:Non focal, sensation is grossly intact. Musculoskeletal:No digital cyanosis Skin:No Rash, warm and dry Wounds:N/A  I have personally reviewed following labs and imaging studies  LABORATORY DATA: CBC:  Recent Labs Lab 09/17/2016 1120 09/29/2016 1815 09/25/16 0407 09/25/16 2128 09/26/16 0355 09/26/16 0745  WBC 12.0* 10.1 12.3* 11.6* 11.2* 11.6*  NEUTROABS 11.1* 9.1*  --  10.6*  --   --   HGB 12.5* 12.3* 11.7* 11.7* 11.8* 12.3*  HCT 36.7*  35.6* 34.3* 34.4* 34.7* 36.1*  MCV 79.3 78.8 79.2 78.7 78.9 79.2  PLT 83* 85* 83* 81* 72* 76*    Basic Metabolic Panel:  Recent Labs Lab 09/22/2016 1815 09/25/16 0407 09/25/16 2128 09/26/16 0355 09/26/16 0740  NA 136 138 134* 135 135  K 5.2* 5.4* 4.5 4.2 4.1  CL 98* 100* 96* 96* 97*  CO2 21* 24 24 25 25   GLUCOSE 42* 91 111* 108* 92  BUN 77* 84* 45* 51* 54*  CREATININE 7.42* 9.22* 6.11* 6.46* 6.79*  CALCIUM 9.2 9.2 8.9 9.0 9.3  PHOS  --   --   --  4.0 4.1    GFR: Estimated Creatinine Clearance: 12.6 mL/min (by C-G formula based on SCr of 6.79 mg/dL (H)).  Liver Function Tests:  Recent Labs Lab 10/02/2016 1120 10/07/2016 1815 09/25/16 0407 09/25/16 2128 09/26/16 0355 09/26/16 0740  AST 74* 67* 62* 53*  --   --   ALT 24 25 24 23   --   --   ALKPHOS 141* 132* 126 113  --   --   BILITOT 6.0* 6.1* 6.8* 6.7*  --   --   PROT 7.8 7.2 7.4 7.1  --   --   ALBUMIN 2.5* 2.3* 2.3* 2.3* 2.2* 2.2*   No  results for input(s): LIPASE, AMYLASE in the last 168 hours.  Recent Labs Lab 09/25/16 2128  AMMONIA 47*    Coagulation Profile:  Recent Labs Lab 09/23/2016 1815  INR 1.53    Cardiac Enzymes:  Recent Labs Lab 09/25/16 0708 09/25/16 1500 09/25/16 2128 09/26/16 0355 09/26/16 0745  TROPONINI 0.21* 0.21* 0.22* 0.31* 0.21*    BNP (last 3 results) No results for input(s): PROBNP in the last 8760 hours.  HbA1C: No results for input(s): HGBA1C in the last 72 hours.  CBG:  Recent Labs Lab 09/25/16 1920 09/25/16 2114 09/25/16 2349 09/26/16 0349 09/26/16 0414  GLUCAP 73 72 71 58* 75    Lipid Profile: No results for input(s): CHOL, HDL, LDLCALC, TRIG, CHOLHDL, LDLDIRECT in the last 72 hours.  Thyroid Function Tests: No results for input(s): TSH, T4TOTAL, FREET4, T3FREE, THYROIDAB in the last 72 hours.  Anemia Panel: No results for input(s): VITAMINB12, FOLATE, FERRITIN, TIBC, IRON, RETICCTPCT in the last 72 hours.  Urine analysis:    Component Value Date/Time   COLORURINE YELLOW 05/03/2013 0603   APPEARANCEUR CLEAR 05/03/2013 0603   LABSPEC 1.012 05/03/2013 0603   PHURINE 8.5 (H) 05/03/2013 0603   GLUCOSEU NEGATIVE 05/03/2013 0603   HGBUR SMALL (A) 05/03/2013 0603   BILIRUBINUR NEGATIVE 05/03/2013 0603   KETONESUR NEGATIVE 05/03/2013 0603   PROTEINUR >300 (A) 05/03/2013 0603   UROBILINOGEN 0.2 05/03/2013 0603   NITRITE NEGATIVE 05/03/2013 0603   LEUKOCYTESUR NEGATIVE 05/03/2013 0603    Sepsis Labs: Lactic Acid, Venous    Component Value Date/Time   LATICACIDVEN 2.3 (HH) 09/25/2016 0708    MICROBIOLOGY: Recent Results (from the past 240 hour(s))  Anaerobic culture     Status: None   Collection Time: 09/19/16  3:22 PM  Result Value Ref Range Status   Specimen Description PERITONEAL  Final   Special Requests NONE  Final   Culture   Final    NO ANAEROBES ISOLATED Performed at [facility]    Report Status 09/23/2016 FINAL  Final  Body  fluid culture     Status: None   Collection Time: 09/19/16  3:22 PM  Result Value Ref Range Status   Specimen Description PERITONEAL  Final  Special Requests NONE  Final   Gram Stain   Final    FEW WBC PRESENT, PREDOMINANTLY MONONUCLEAR NO ORGANISMS SEEN    Culture   Final    NO GROWTH 3 DAYS Performed at [facility], [address], [place], [place] [address]    Report Status 09/23/2016 FINAL  Final  Acid Fast Smear (AFB)     Status: None   Collection Time: 09/19/16  3:22 PM  Result Value Ref Range Status   AFB Specimen Processing Concentration  Final   Acid Fast Smear Negative  Final    Comment: (NOTE) Performed At: [facility] [address] [place], [place] [id number] Mila Homer MD [id number]    Source (AFB) PERITONEAL  Final  Blood Culture (routine x 2)     Status: None (Preliminary result)   Collection Time: 09/15/2016 11:10 AM  Result Value Ref Range Status   Specimen Description BLOOD RIGHT ARM  Final   Special Requests BOTTLES DRAWN AEROBIC AND ANAEROBIC 5CC  Final   Culture NO GROWTH 1 DAY  Final   Report Status PENDING  Incomplete  Blood Culture (routine x 2)     Status: None (Preliminary result)   Collection Time: 09/29/2016 11:20 AM  Result Value Ref Range Status   Specimen Description BLOOD RIGHT ANTECUBITAL  Final   Special Requests BOTTLES DRAWN AEROBIC AND ANAEROBIC 5CC  Final   Culture NO GROWTH 1 DAY  Final   Report Status PENDING  Incomplete  Respiratory Panel by PCR     Status: Abnormal   Collection Time: 09/25/16  3:19 PM  Result Value Ref Range Status   Adenovirus NOT DETECTED NOT DETECTED Final   Coronavirus 229E NOT DETECTED NOT DETECTED Final   Coronavirus HKU1 DETECTED (A) NOT DETECTED Final   Coronavirus NL63 NOT DETECTED NOT DETECTED Final   Coronavirus OC43 NOT DETECTED NOT DETECTED Final   Metapneumovirus NOT DETECTED NOT DETECTED Final   Rhinovirus / Enterovirus NOT DETECTED NOT DETECTED Final   Influenza A NOT  DETECTED NOT DETECTED Final   Influenza B NOT DETECTED NOT DETECTED Final   Parainfluenza Virus 1 NOT DETECTED NOT DETECTED Final   Parainfluenza Virus 2 NOT DETECTED NOT DETECTED Final   Parainfluenza Virus 3 NOT DETECTED NOT DETECTED Final   Parainfluenza Virus 4 NOT DETECTED NOT DETECTED Final   Respiratory Syncytial Virus NOT DETECTED NOT DETECTED Final   Bordetella pertussis NOT DETECTED NOT DETECTED Final   Chlamydophila pneumoniae NOT DETECTED NOT DETECTED Final   Mycoplasma pneumoniae NOT DETECTED NOT DETECTED Final    RADIOLOGY STUDIES/RESULTS: Dg Chest 2 View  Result Date: 09/25/2016 CLINICAL DATA:  Shortness of breath.  Cough and sepsis. EXAM: CHEST  2 VIEW COMPARISON:  09/19/2016; 09/18/2016; 03/23/2016 ; 11/13/2015; chest CT -11/13/2025 FINDINGS: Grossly unchanged enlarged cardiac silhouette and mediastinal contours with masslike prominence of the right hilum compatible with known mediastinal lymphadenopathy. Atherosclerotic plaque within the thoracic aorta. Ill-defined heterogeneous airspace opacities within the bilateral mid and lower lungs are unchanged the slightly progressed in the interval. Pulmonary vasculature remains indistinct. Suspected trace right-sided effusion, unchanged. No pneumothorax. Unchanged bones. IMPRESSION: 1. No change to slight progression of bilateral mid and lower lung heterogeneous airspace opacities with differential considerations including asymmetric alveolar pulmonary edema versus progression of multifocal infection, including atypical etiologies. Clinical correlation is advised. 2. Similar findings of mediastinal adenopathy, similar to remote chest CT performed 11/2015. 3. Cardiomegaly. 4.  Aortic Atherosclerosis (ICD10-170.0) Electronically Signed   By: Jonny Ruiz  Watts M.D.   On: 09/25/2016 07:56   Dg Chest 2 View  Result Date: 09/18/2016 CLINICAL DATA:  Abdominal and chest pain EXAM: CHEST  2 VIEW COMPARISON:  Chest radiograph 03/23/2016 FINDINGS: There  is massive cardiomegaly, unchanged, with calcification within the aortic arch. There are bilateral interstitial opacities likely indicating mild interstitial edema. No pneumothorax or pleural effusion. No focal airspace consolidation. IMPRESSION: 1. Massive cardiomegaly and aortic atherosclerosis. 2. Mild interstitial pulmonary edema. Electronically Signed   By: Deatra Robinson M.D.   On: 09/18/2016 23:52   Ct Head Wo Contrast  Result Date: 09/10/2016 CLINICAL DATA:  Altered mental status EXAM: CT HEAD WITHOUT CONTRAST TECHNIQUE: Contiguous axial images were obtained from the base of the skull through the vertex without intravenous contrast. COMPARISON:  03/24/2016 FINDINGS: Brain: No evidence of acute infarction, hemorrhage, hydrocephalus, extra-axial collection or mass lesion/mass effect. Periventricular white matter low attenuation as can be seen with microvascular disease. Generalized cerebral atrophy. Vascular: No hyperdense vessel or unexpected calcification. Skull: No osseous abnormality. Sinuses/Orbits: Mucous retention cyst in bilateral maxillary sinuses. Visualized mastoid sinuses are clear. Visualized orbits demonstrate no focal abnormality. Other: None IMPRESSION: 1. No acute intracranial pathology. Electronically Signed   By: Elige Ko   On: 09/09/2016 14:22   US Paracentesis  Result Date: 09/19/2016 INDICATION: Pt with history of ESRD, CAD,CHF, chronic liver disease by imaging, ascites; request received for diagnostic/therapeutic paracentesis. EXAM: ULTRASOUND GUIDED DIAGNOSTIC/THERAPEUTIC PARACENTESIS MEDICATIONS: None. COMPLICATIONS: None immediate. PROCEDURE: Informed written consent was obtained from the patient after a discussion of the risks, benefits and alternatives to treatment. A timeout was performed prior to the initiation of the procedure. Initial ultrasound scanning demonstrates a small amount of ascites within the right upper to mid abdominal quadrant. The right upper to mid  abdomen was prepped and draped in the usual sterile fashion. 1% lidocaine was used for local anesthesia. Following this, a Yueh catheter was introduced. An ultrasound image was saved for documentation purposes. The paracentesis was performed. The catheter was removed and a dressing was applied. The patient tolerated the procedure well without immediate post procedural complication. FINDINGS: A total of approximately 200 cc of hazy, blood-tinged/amber fluid was removed. Samples were sent to the laboratory for as ordered by primary team. IMPRESSION: Successful ultrasound-guided diagnostic/therapeutic paracentesis yielding 200 cc of peritoneal fluid. Despite pt's known abdominal fullness/discomfort only the above amount of fluid was noted on today's limited US abdomen. If symptoms persists consider CT abdomen/pelvis for further evaluation. Read by: Jeananne Rama, PA-C Electronically Signed   By: Irish Lack M.D.   On: 09/19/2016 11:50   Dg Chest Portable 1 View  Result Date: 09/10/2016 CLINICAL DATA:  Cough, congestion, pneumonia, sepsis EXAM: PORTABLE CHEST 1 VIEW COMPARISON:  09/18/2016 FINDINGS: Mild patchy right lower lobe opacity. No frank interstitial edema. No pleural effusion or pneumothorax. Cardiomegaly. IMPRESSION: Mild patchy right lower lobe opacity, atelectasis versus pneumonia. Electronically Signed   By: Charline Bills M.D.   On: 09/26/2016 14:51     LOS: 2 days   Jeoffrey Massed, MD  [facility] Pager:[phone]  If 7PM-7AM, please contact night-coverage [email or web address] Password [email or web address] 09/26/2016, 12:45 PM

## 2016-09-26 NOTE — Procedures (Signed)
Central Venous Catheter Insertion Procedure Note Trevor [name] [id number] 29-Oct-1950  Procedure: Insertion of Central Venous Catheter Indications: Assessment of intravascular volume and Drug and/or fluid administration  Procedure Details Consent: Unable to obtain consent because of emergent medical necessity. Time Out: Verified patient identification, verified procedure, site/side was marked, verified correct patient position, special equipment/implants available, medications/allergies/relevent history reviewed, required imaging and test results available.  Performed  Maximum sterile technique was used including antiseptics, cap, gloves, gown, hand hygiene, mask and sheet. Skin prep: Chlorhexidine; local anesthetic administered A antimicrobial bonded/coated triple lumen catheter was placed in the right internal jugular vein using the Seldinger technique.  Evaluation Blood flow good Complications: No apparent complications Patient did tolerate procedure well. Chest X-ray ordered to verify placement.  CXR: pending.  Trevor [name] 09/26/2016, 5:08 PM  I was present and supervised the entire procedure.  [name], M.D. [facility] Pulmonary/Critical Care Medicine. Pager: [phone]. After hours pager: [phone].

## 2016-09-26 NOTE — Consult Note (Signed)
[facility]       Reason for Consult: ? pneumonia    Referring Physician: Dr. 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 digoxin  0.125 mg Oral Daily  . doxercalciferol  3 mcg Intravenous Q M,W,F-HD  . guaiFENesin  1,200 mg Oral BID  . midodrine  10 mg Oral TID WC  . piperacillin-tazobactam (ZOSYN)  IV  3.375 g Intravenous Q12H  . sevelamer carbonate  800 mg Oral TID WC  . vancomycin  1,000 mg Intravenous Q T,Th,Sa-HD    Recommendations: Continue antibiotics   Assessment: He has clinical findings c/w viral pneumonia with hypoxia, low grade fever, multifocal opacities and positive coronavirus.  He though is significantly sob and should continue with zosyn for now. Has GPC on 1 blood culture, ID pending.  Not c/w pneumonia though if Staph aureus could be post viral pneumonia but with multifocal findings on cxr, not typical.  Would continue vancomcyin for now.    Antibiotics: vancomcyin and zosyn  HPI: Trevor [name] is a 66 y.o. male with ESRD on dialysis with NICM with EF of 15-20% who comes in with progressive SOB and weakness.  Noted to have URI symptoms 1/15 at HD.  Currently the patient is in respiratory distress and does not answer questions.   CXR independently reviewed and with pulmonary edema/multifocal opacities and done after dialysis.   Respiratory viral panel positive.   Review of Systems:  Unable to be assessed due to  mental status   Past Medical History:  Diagnosis Date  . Acute on chronic systolic right heart failure (HCC) 11/14/2015  . Anemia   . Atrial fibrillation (HCC)    not on coumadin due to large spontaneous hematoma on back and anemia  . Atrial flutter (HCC) 04/27/2013  . AVF (arteriovenous fistula) (HCC)    Left  . CHF (congestive heart failure) (HCC)    EF 20-25%  . Coronary artery disease   . Diverticulosis   . Dysrhythmia    afib  . Elevated troponin 11/14/2015  . ESRD (end stage renal disease) on dialysis (HCC)    [facility] mon/wed/fri  . Exertional shortness of breath    "related to infection in my lungs right now" (05/03/2013)  . GERD (gastroesophageal reflux disease)   . Hepatosplenomegaly 10/18/2015   Workup per Dr Kaplan/ GI in Dec 2015 > abd pain improved after paracentesis x 2 (1.2L, 1.3L). Had negative hepA/ hep B/ hep C testing. For hepatosplenomagealy pt underwent transjugular liver biopsy by IR with hepatic vein wedge pressure measurement which showed elevated right heart and free hepatic venous pressures likely due to right heart failure.  There was no evidence of portal hypertension. Liver bx originally was reported as "End stage liver disease/ cirrhosis", then was corrected > "No evidence of cirrhosis".  The corrected liver biopsy result reads "SINUSOIDAL DILATATION WITH SCATTERED FOCI OF HEPATITIS".  Last GI visit was Aug 2016 for  bloating/ abd pain, noted he had a normal gastric empty study.  -Right lobe liver lesion. Felt to be hemangioma, Biopsy pending.  -thrombocytopenia. Dates back to 08/2013.  -ESRD. MWF HD.  -biventricular heart failure, acute right sided heart failure. Daily HD to address volume overload.    Marland Kitchen Hereditary and idiopathic peripheral neuropathy 08/29/2015  . History of gout    "before I started doing the dialysis" (05/03/2013)  . Hypertension   . Hypertensive urgency    H/o  . Hypovitaminosis D   . Myocardial infarction 90's  . Peripheral vascular  disease (HCC)    dvt leg 12/14  . Pulmonary embolism (HCC)    with right DVT secondary to recent surgery  . Rectal bleeding 11/2015  . Renal insufficiency   . S/P repair of ventral hernia 03/21/2014  . Secondary hyperparathyroidism (HCC)   . Sleep apnea   . Syncope    felt secondary to residual anesthesia the day before - 2D echo unremarkable    Social History  Substance Use Topics  . Smoking status: Former Smoker    Packs/day: 0.25    Years: 0.50    Types: Cigarettes    Quit date: 09/08/1972  . Smokeless tobacco: Never Used  . Alcohol use No     Comment: 05/03/2013 "haven't had a glass of wine in ~ 3 months or so; sometimes will have one w/dinner"    Family History  Problem Relation Age of Onset  . Hypertension Father   . Kidney disease Father   . Allergies Father   . Deep vein thrombosis Sister   . Pulmonary embolism Sister   . Diabetes Paternal Grandmother     Allergies  Allergen Reactions  . Pork-Derived Products Other (See Comments)    No pork products for religious reasons    Physical Exam: Constitutional: moderately ill; moderate distress Vitals:   09/26/16 1030 09/26/16 1202  BP: 112/60 101/62  Pulse: (!) 110 (!) 111  Resp: 20 (!) 25  Temp:     EYES: anicteric Cardiovascular: Cor Tachy Respiratory: CTA B; no crackles, no wheezes; increased respiratory effort GI: Bowel sounds are normal, liver is not enlarged, spleen is not enlarged Musculoskeletal: no pedal edema noted Skin: negatives: no rash Hematologic: no cervical lad Neuro: non focal  Lab Results  Component Value Date   WBC 11.6 (H) 09/26/2016   HGB 12.3 (L) 09/26/2016   HCT 36.1 (L) 09/26/2016   MCV 79.2 09/26/2016   PLT 76 (L) 09/26/2016    Lab Results  Component Value Date   CREATININE 6.79 (H) 09/26/2016   BUN 54 (H) 09/26/2016   NA 135 09/26/2016   K 4.1 09/26/2016   CL 97 (L) 09/26/2016   CO2 25 09/26/2016    Lab Results  Component Value Date   ALT 23 09/25/2016   AST 53 (H)  09/25/2016   ALKPHOS 113 09/25/2016     Microbiology: Recent Results (from the past 240 hour(s))  Anaerobic culture     Status: None   Collection Time: 09/19/16  3:22 PM  Result Value Ref Range Status   Specimen Description PERITONEAL  Final   Special Requests NONE  Final   Culture   Final    NO ANAEROBES ISOLATED Performed at [facility]    Report Status 09/23/2016 FINAL  Final  Body fluid culture     Status: None   Collection Time: 09/19/16  3:22 PM  Result Value Ref Range Status   Specimen Description PERITONEAL  Final  Special Requests NONE  Final   Gram Stain   Final    FEW WBC PRESENT, PREDOMINANTLY MONONUCLEAR NO ORGANISMS SEEN    Culture   Final    NO GROWTH 3 DAYS Performed at [facility], [address] [place], [place] [address]    Report Status 09/23/2016 FINAL  Final  Acid Fast Smear (AFB)     Status: None   Collection Time: 09/19/16  3:22 PM  Result Value Ref Range Status   AFB Specimen Processing Concentration  Final   Acid Fast Smear Negative  Final    Comment: (NOTE) Performed At: [facility] [address] [place], [place] [id number] Mila Homer MD [id number]    Source (AFB) PERITONEAL  Final  Blood Culture (routine x 2)     Status: None (Preliminary result)   Collection Time: 2016-10-22 11:10 AM  Result Value Ref Range Status   Specimen Description BLOOD RIGHT ARM  Final   Special Requests BOTTLES DRAWN AEROBIC AND ANAEROBIC 5CC  Final   Culture NO GROWTH 1 DAY  Final   Report Status PENDING  Incomplete  Blood Culture (routine x 2)     Status: None (Preliminary result)   Collection Time: 2016-10-22 11:20 AM  Result Value Ref Range Status   Specimen Description BLOOD RIGHT ANTECUBITAL  Final   Special Requests BOTTLES DRAWN AEROBIC AND ANAEROBIC 5CC  Final   Culture NO GROWTH 1 DAY  Final   Report Status PENDING  Incomplete  Respiratory Panel by PCR     Status: Abnormal   Collection Time: 09/25/16  3:19 PM    Result Value Ref Range Status   Adenovirus NOT DETECTED NOT DETECTED Final   Coronavirus 229E NOT DETECTED NOT DETECTED Final   Coronavirus HKU1 DETECTED (A) NOT DETECTED Final   Coronavirus NL63 NOT DETECTED NOT DETECTED Final   Coronavirus OC43 NOT DETECTED NOT DETECTED Final   Metapneumovirus NOT DETECTED NOT DETECTED Final   Rhinovirus / Enterovirus NOT DETECTED NOT DETECTED Final   Influenza A NOT DETECTED NOT DETECTED Final   Influenza B NOT DETECTED NOT DETECTED Final   Parainfluenza Virus 1 NOT DETECTED NOT DETECTED Final   Parainfluenza Virus 2 NOT DETECTED NOT DETECTED Final   Parainfluenza Virus 3 NOT DETECTED NOT DETECTED Final   Parainfluenza Virus 4 NOT DETECTED NOT DETECTED Final   Respiratory Syncytial Virus NOT DETECTED NOT DETECTED Final   Bordetella pertussis NOT DETECTED NOT DETECTED Final   Chlamydophila pneumoniae NOT DETECTED NOT DETECTED Final   Mycoplasma pneumoniae NOT DETECTED NOT DETECTED Final    Marcel Sorter, Molly Maduro, MD [facility] [email or web address] [phone] pager  [phone] cell 09/26/2016, 12:22 PM

## 2016-09-26 NOTE — Progress Notes (Signed)
CRITICAL VALUE ALERT  Critical value received: Lactic Acid 2.1  Date of notification: 09/26/16  Time of notification:  1710  Critical value read back:Yes.    Nurse who received alert: Kathlen Mody RN  MD notified :  Anders Simmonds NP at [phone]

## 2016-09-26 NOTE — Progress Notes (Signed)
The patient is noted to have a lactate of 2.3 With the current information available to me, I don't think the patient is in septic shock. This elevated lactic acid, is related to respiratory distress/ respiratory failure in setting of evolving ARDS/pneumonitis  and possibly volume depletion after just getting HD.  Plan Will give 500 ml fluid challenge but not the 30 ml/kg given diffuse pulmonary infiltrates, ESRD and known CM w EF 30%.  He is still pending ICU transfer and intubation.  Post-intubation will place CVL and flow track. This will help to better assess and guid fluid resuscitation efforts.   [name] ACNP-BC [facility] Pulmonary/Critical Care Pager # [phone] OR # [phone] if no answer

## 2016-09-26 NOTE — Progress Notes (Addendum)
Hypoglycemic Event  CBG: 61  Treatment: D 50 63mL  Symptoms: Confusion  Follow-up CBG: Time:  1305 CBG Result:  91  Possible Reasons for Event:   Comments/MD notified: Venita Lick, Lydiann Bonifas L

## 2016-09-26 NOTE — Consult Note (Signed)
PULMONARY / CRITICAL CARE MEDICINE   Name: Trevor [name] MRN: [id number] DOB: 1950/10/05    ADMISSION DATE:  09/20/2016 CONSULTATION DATE:  1/19  REFERRING MD:  Sloan Leiter  CHIEF COMPLAINT:   Progressive acute hypoxic respiratory failure, SIRS/sepsis AND worsening Encephalopathy   HISTORY OF PRESENT ILLNESS:   66 year old male w/ sig h/o systolic CM (EF 24-82%), AF, (not on AC)  ESRD TTS, and cardiac related cirrhosis. Admitted 1/17 w/ 3 d h/o URI symptoms, temp as high as 103, worsening malaise and progressive dyspnea. The day of admit his wife found him to be disoriented. On arrival to the ER his room air sats were in 80s. His MS and sats improved w/ oxygen. He was admitted w/ working dx of HCAP. Placed on broad spec abx and admitted to the SDU. From 1/17 to 1/19 his confusion actually continued to worsen. His RVP PCR was positive for Coronavirus. He underwent HD X 2 (From time of admit to 1/19). When he returned to the SDU on 1/19 after HD (net -~2.5 L) he was found to be more confused, post HS CXR w/ progression of bilateral R>L infiltrates and O2 requirements up to 5 liters w/ RR in 30s to 40s. PCCM was asked to see given concern that his clinical course had been deteriorating.   PAST MEDICAL HISTORY :  He  has a past medical history of Acute on chronic systolic right heart failure ([place]) (11/14/2015); Anemia; Atrial fibrillation ([place]); Atrial flutter ([place]) (04/27/2013); AVF (arteriovenous fistula) (HCC); CHF (congestive heart failure) ([place]); Coronary artery disease; Diverticulosis; Dysrhythmia; Elevated troponin (11/14/2015); ESRD (end stage renal disease) on dialysis [facility]); Exertional shortness of breath; GERD (gastroesophageal reflux disease); Hepatosplenomegaly (10/18/2015); Hereditary and idiopathic peripheral neuropathy (08/29/2015); History of gout; Hypertension; Hypertensive urgency; Hypovitaminosis D; Myocardial infarction (90's); Peripheral vascular disease ([place]); Pulmonary embolism ([place]);  Rectal bleeding (11/2015); Renal insufficiency; S/P repair of ventral hernia (03/21/2014); Secondary hyperparathyroidism ([place]); Sleep apnea; and Syncope.  PAST SURGICAL HISTORY: He  has a past surgical history that includes AV fistula placement (Left); AV fistula placement (Right, ~ 2011); Knee arthroscopy (Left); Arteriovenous goretex graft removal (Right, 05/04/2013); Insertion of dialysis catheter (Right, 05/04/2013); Colonoscopy (10-29-2005); Bascilic vein transposition (Left, 06/27/2013); Cardioversion (N/A, 08/29/2013); Removal of a dialysis catheter (2/15); Hernia repair (03/21/14); Umbilical hernia repair (N/A, 03/21/2014); Insertion of mesh (N/A, 03/21/2014); venogram (Left, 05/26/2013); Cardiac catheterization (N/A, 11/15/2015); Cardiac catheterization (Right, 11/15/2015); Colonoscopy (N/A, 11/30/2015); and Esophagogastroduodenoscopy (N/A, 11/30/2015).  Allergies  Allergen Reactions  . Pork-Derived Products Other (See Comments)    No pork products for religious reasons    No current facility-administered medications on file prior to encounter.    Current Outpatient Prescriptions on File Prior to Encounter  Medication Sig  . albuterol (PROVENTIL HFA;VENTOLIN HFA) 108 (90 Base) MCG/ACT inhaler Inhale 2 puffs into the lungs every 4 (four) hours as needed for wheezing or shortness of breath.  . labetalol (NORMODYNE) 200 MG tablet Take 100 mg by mouth once a week.   Marland Kitchen oxyCODONE-acetaminophen (PERCOCET/ROXICET) 5-325 MG tablet Take 1 tablet by mouth every 6 (six) hours as needed for severe pain. Pain   . RENVELA 800 MG tablet Take by mouth 3 (three) times daily with meals. Take 1-4 tablets with each meal  . aspirin EC 325 MG EC tablet Take 1 tablet (325 mg total) by mouth daily. (Patient not taking: Reported on 09/19/2016)  . cephALEXin (KEFLEX) 250 MG capsule Take 1 capsule (250 mg total) by mouth 2 (two) times daily. (Patient  not taking: Reported on 09/19/2016)  . fluticasone (FLONASE) 50 MCG/ACT nasal  spray 1-2 Sprays per nostril daily as needed. (Patient not taking: Reported on 09/19/2016)  . hydrOXYzine (ATARAX/VISTARIL) 10 MG tablet Take 1 tablet (10 mg total) by mouth every 6 (six) hours as needed for itching. (Patient not taking: Reported on 09/19/2016)  . nitroGLYCERIN (NITROSTAT) 0.4 MG SL tablet Place 1 tablet (0.4 mg total) under the tongue every 5 (five) minutes as needed for chest pain. (Patient not taking: Reported on 09/10/2016)  . Nutritional Supplements (FEEDING SUPPLEMENT, NEPRO CARB STEADY,) LIQD Take 237 mLs by mouth 2 (two) times daily between meals. (Patient not taking: Reported on 09/19/2016)    FAMILY HISTORY:  His indicated that his mother is alive. He indicated that his father is deceased. He indicated that all of his three sisters are alive. He indicated that all of his three brothers are alive. He indicated that his paternal grandmother is deceased. He indicated that his other is alive.    SOCIAL HISTORY: He  reports that he quit smoking about 44 years ago. His smoking use included Cigarettes. He has a 0.12 pack-year smoking history. He has never used smokeless tobacco. He reports that he does not drink alcohol or use drugs.  REVIEW OF SYSTEMS:   Not able   SUBJECTIVE:  Encephalopathic, RR 30s  VITAL SIGNS: BP 101/62   Pulse (!) 111   Temp 99.1 F (37.3 C) (Oral)   Resp (!) 25   Ht _0  (1.854 m)   Wt 198 lb 6.6 oz (90 kg)   SpO2 91%   BMI 26.18 kg/m  5 liters  HEMODYNAMICS:    VENTILATOR SETTINGS:    INTAKE / OUTPUT:  Intake/Output Summary (Last 24 hours) at 09/26/16 1427 Last data filed at 09/26/16 0700  Gross per 24 hour  Intake              584 ml  Output                0 ml  Net              584 ml     General appearance:  66 Year old  Male, well nourished, currently in acute distress, confused,  & minimally conversant  Eyes: anicteric,  moist conjunctivae; PERRL, EOMI bilaterally. Mouth:  membranes and no mucosal ulcerations; normal  hard and soft palate Neck: Trachea midline; neck supple, no JVD Lungs/chest: RR 30s-40s, shallow, no intercostal retraction but crackles bilaterally posteriorly and decreased on right >left  CV: tachy irreg w/ IV/VI holosystolic murmur. LUE AVG w/ good bruit and thrill  Abdomen: distended and firm but  non-tender; no masses or HSM Extremities: No peripheral edema or extremity lymphadenopathy Skin: warm temperature, turgor and texture nml; old healed papular rash on back, no ulcers or subcutaneous nodules Psych: withdrawn affect, confused, agitated at times, no focal motor def.  LABS:  BMET  Recent Labs Lab 09/25/16 2128 09/26/16 0355 09/26/16 0740  NA 134* 135 135  K 4.5 4.2 4.1  CL 96* 96* 97*  CO2 _1 BUN 45* 51* 54*  CREATININE 6.11* 6.46* 6.79*  GLUCOSE 111* 108* 92    Electrolytes  Recent Labs Lab 09/25/16 2128 09/26/16 0355 09/26/16 0740  CALCIUM 8.9 9.0 9.3  PHOS  --  4.0 4.1    CBC  Recent Labs Lab 09/25/16 2128 09/26/16 0355 09/26/16 0745  WBC 11.6* 11.2* 11.6*  HGB 11.7* 11.8* 12.3*  HCT 34.4*  34.7* 36.1*  PLT 81* 72* 76*    Coag's  Recent Labs Lab 09/08/2016 1815  APTT 46*  INR 1.53    Sepsis Markers  Recent Labs Lab 10/03/2016 1815 09/10/2016 2144 09/25/16 0708  LATICACIDVEN 2.6* 3.2* 2.3*  PROCALCITON 50.54  --   --     ABG No results for input(s): PHART, PCO2ART, PO2ART in the last 168 hours.  Liver Enzymes  Recent Labs Lab 10/04/2016 1815 09/25/16 0407 09/25/16 2128 09/26/16 0355 09/26/16 0740  AST 67* 62* 53*  --   --   ALT _0 --   --   ALKPHOS 132* 126 113  --   --   BILITOT 6.1* 6.8* 6.7*  --   --   ALBUMIN 2.3* 2.3* 2.3* 2.2* 2.2*    Cardiac Enzymes  Recent Labs Lab 09/25/16 2128 09/26/16 0355 09/26/16 0745  TROPONINI 0.22* 0.31* 0.21*    Glucose  Recent Labs Lab 09/25/16 2114 09/25/16 2349 09/26/16 0349 09/26/16 0414 09/26/16 1243 09/26/16 1308  GLUCAP 72 71 58* 75 61* 91     Imaging Dg Chest Port 1 View  Result Date: 09/26/2016 EXAM: PORTABLE CHEST 1 VIEW COMPARISON:  09/25/2016 FINDINGS: There has been slight progression of bilateral pulmonary edema. Chronic cardiomegaly. No visible effusions.  No acute bone abnormality. IMPRESSION: Slight progression of bilateral pulmonary infiltrates, most consistent with pulmonary edema. Electronically Signed   By: Lorriane Shire M.D.   On: 09/26/2016 13:00  worsening right > left airspace disease   STUDIES:  ECHO 1/19>>>  CULTURES: RVP PCR 1/18: Coronavirus HKUI BCX2 1/17>>> BAL 1/19 >>> Peritoneal 1/19>>>  ANTIBIOTICS: vanc 1/17>> Zosyn 1/17>>> levaquin 1/19>>>  SIGNIFICANT EVENTS:   LINES/TUBES:   DISCUSSION: 71 yom w/ advanced systolic CM (EF 06-23%), ESRD, and AF. Admitted 1/17 w/ viral PNA +/- HCAP. Now w/ progressive hypoxia, pulmonary infiltrates and respiratory failure. Looks like evolving ALI w/ persistent SIRS response. Looks like he is rapidly clinically declining.  Will transfer to ICU -intubate get BAL -ARDS protocol -add atypical coverage  Ck lactic acid and CVP  ASSESSMENT / PLAN:  PULMONARY A: Acute hypoxic Respiratory failure in the setting of progressive and bilateral pulmonary infiltrates.  Presume viral Pneumonitis +/- HCAP  W/ progressive ARDS  P:   Intubate ARDS protocol Bronch for BAL PAD protocol  F/u abg and PCXR Para if has sig ascites to decrease C-abd  CARDIOVASCULAR A:  Af w/ RVR SIRS/sepsis  Troponin elevation  Chronic systolic HF w/ EF 76-28% P:  Ck lactic acid Tele CVP goal 8-12 MAP >60 Volume/pressors as indicated   RENAL A:   ESRD-TTS P:   Renal following F/u chemistry   GASTROINTESTINAL A:   Hepatic congestion/ w/ hepatonephromegaly w/ presumed cardiac related Cirrhosis (has had prior paracentesis on 1/12) Ascites  P:   eval abd w/ US-->tap to r/o SBP if sig fluid; also would decrease WOB  HEMATOLOGIC A:   thrombocytopenia  Remote  DVT P:  Trend CBC Transfuse per protocol   INFECTIOUS A:   Viral PNA + Coronavirus HKU1 HCAP Sirs/sepsis  P:   BAL Consider PCP Cont current abx as above   ENDOCRINE A:   Hypoglycemia  P:   Cont D5 Cont cbgs  Avoid SSI unless consistently > 200  NEUROLOGIC A:   Acute encephalopathy in setting of sepsis P:   RASS goal: -1 to -2 PAD protocol   FAMILY  - Updates:   - Inter-disciplinary family meet or Palliative Care meeting due  by:  1/26 My critical care time 45 minutes   Erick Colace ACNP-BC [place] Pager # [phone] OR # [phone] if no answer  09/26/2016, 1:20 PM  Attending Note:  66 year old male with extensive PMH who present with SOB and worsening pulmonary infiltrate.  PCCM consulted for worsening SOB and hypotension.  On exam, RR in the 40's and diffuse crackles in all lung fields.  I reviewed CXR myself, diffuse infiltrate noted.  Given rapid deterioration, patient was transferred to the ICU, intubated and central line placed.  CVP checks ordered.  U/S of the abdomen without large ascites so no need for para.  Discussed with RT, will bronch and send for cultures.  Continue broad spectrum abx and f/u on cultures.  F/U bronch cultures.  Sepsis and ARDS protocol.  PCCM will take over care.  The patient is critically ill with multiple organ systems failure and requires high complexity decision making for assessment and support, frequent evaluation and titration of therapies, application of advanced monitoring technologies and extensive interpretation of multiple databases.   Critical Care Time devoted to patient care services described in this note is  60  Minutes. This time reflects time of care of this signee Dr Jennet Maduro. This critical care time does not reflect procedure time, or teaching time or supervisory time of PA/NP/Med student/Med Resident etc but could involve care discussion time.  Rush Farmer, M.D. [facility]  Pulmonary/Critical Care Medicine. Pager: [phone]. After hours pager: [phone].

## 2016-09-26 NOTE — Progress Notes (Signed)
Pt was standing at bedside naked, confused. RN reoriented pt and got back in bed safely. Pt's CBG was 73 half amp of dex given. Pt woke up confused again and keep getting out of bed, refusing to settled down, getting combative with nursing staff. NP made aware of mental status change in pt. Wife was called and updated.

## 2016-09-26 NOTE — Progress Notes (Signed)
Pharmacy Antibiotic Note  Trevor [name] is a 66 y.o. male admitted on 10-14-16 with pneumonia and sepsis.   Continues on abx for sepsis/PNA. Positive for coronavirus. Afebrile yesterday, WBC down to 11.6.  Plan: Give vancomycin 1g IV x 1 today since had extra HD session Continue Zosyn 3.375g IV Q12 Monitor clinical picture, renal function, pre-HD VR prn F/U HD schedule, C&S, abx deescalation / LOT  Consider stopping abx as most likely viral illness (left sticky note)  Height: 6\' 1"  (185.4 cm) Weight: 198 lb 6.6 oz (90 kg) IBW/kg (Calculated) : 79.9  Temp (24hrs), Avg:99 F (37.2 C), Min:97.7 F (36.5 C), Max:100.1 F (37.8 C)   Recent Labs Lab Oct 14, 2016 1134 2016-10-14 1440 10/14/16 1815 10-14-2016 2144 09/25/16 0407 09/25/16 0708 09/25/16 2128 09/26/16 0355 09/26/16 0740 09/26/16 0745  WBC  --   --  10.1  --  12.3*  --  11.6* 11.2*  --  11.6*  CREATININE  --   --  7.42*  --  9.22*  --  6.11* 6.46* 6.79*  --   LATICACIDVEN 3.77* 2.6* 2.6* 3.2*  --  2.3*  --   --   --   --     Estimated Creatinine Clearance: 12.6 mL/min (by C-G formula based on SCr of 6.79 mg/dL (H)).    Allergies  Allergen Reactions  . Pork-Derived Products Other (See Comments)    No pork products for religious reasons   Antimicrobials this admission:  Zosyn 1/17 >>  Vancomycin 1/17 >>   Dose adjustments this admission:  n/a  Microbiology results:  1/17 BCx: ngtd 1/17 UCx: sent  1/17 Sputum: sent  1/17 Resp Panel: Rosezena Sensor, PharmD, BCPS Clinical Pharmacist Pager [phone] 09/26/2016 12:35 PM

## 2016-09-26 NOTE — Procedures (Signed)
Bronchoscopy Procedure Note Trevor [name] [id number] [birth year]  Procedure: Bronchoscopy Indications: Obtain specimens for culture and/or other diagnostic studies  Procedure Details Consent: Risks of procedure as well as the alternatives and risks of each were explained to the (patient/caregiver).  Consent for procedure obtained. Time Out: Verified patient identification, verified procedure, site/side was marked, verified correct patient position, special equipment/implants available, medications/allergies/relevent history reviewed, required imaging and test results available.  Performed  In preparation for procedure, patient was given 100% FiO2 and bronchoscope lubricated. Sedation: Muscle relaxants and Etomidate  Airway entered and the following bronchi were examined: RUL, RML, RLL, LUL, LLL and Bronchi.   -airways were diffusely  erythemic throughout  Procedures performed:BAL from RLL Bronchoscope removed.    Evaluation Hemodynamic Status: BP stable throughout; O2 sats: stable throughout Patient's Current Condition: stable Specimens:  Sent serosanguinous fluid Complications: No apparent complications Patient did tolerate procedure well.  Trevor [name] ACNP-BC [place] Pager # [phone] OR # [phone] if no answer   Clementeen Graham 09/26/2016  Rush Farmer, M.D. [facility] Pulmonary/Critical Care Medicine. Pager: [phone]. After hours pager: [phone].

## 2016-09-26 NOTE — Procedures (Signed)
Bedside US   No ascites noted.   [name] ACNP-BC [facility] Pulmonary/Critical Care Pager # [phone] OR # [phone] if no answer  [name], M.D. [facility] Pulmonary/Critical Care Medicine. Pager: [phone]. After hours pager: [phone]

## 2016-09-26 NOTE — Progress Notes (Signed)
CRITICAL VALUE ALERT  Critical value received:  2.7  Date of notification:  09/26/16  Time of notification:  1347  Critical value read back:  yes  Nurse who received alert:  Hatsuko Bizzarro  MD notified (1st page):  Ghirmire  Time of first page:  1348  MD notified (2nd page):  Time of second page:  Responding MD:    Time MD responded:

## 2016-09-26 NOTE — Progress Notes (Signed)
Pharmacy Antibiotic Note  Trevor [name] is a 66 y.o. male admitted on 09/12/2016 with pneumonia and sepsis. Patient is ESRD w/ HD MWF.  Levofloxacin was added to vancomycin and zosyn for sepsis/PNA. Positive for coronavirus. Afebrile yesterday, WBC down to 11.6.  Plan: Start levofloxacin 750mg  x1, then 500mg  q48h IV Vancomycin 1g IV x 1 today with extra HD session Continue Zosyn 3.375g IV Q12 Monitor clinical picture, renal function, pre-HD VR prn F/U HD schedule, C&S, abx deescalation / LOT  Consider stopping abx as most likely viral illness (left sticky note)  Height: 6\' 1"  (185.4 cm) Weight: 198 lb 6.6 oz (90 kg) IBW/kg (Calculated) : 79.9  Temp (24hrs), Avg:99.1 F (37.3 C), Min:97.7 F (36.5 C), Max:100.1 F (37.8 C)   Recent Labs Lab 09/13/2016 1440 09/22/2016 1815 10/01/2016 2144 09/25/16 0407 09/25/16 0708 09/25/16 2128 09/26/16 0355 09/26/16 0740 09/26/16 0745 09/26/16 1237  WBC  --  10.1  --  12.3*  --  11.6* 11.2*  --  11.6*  --   CREATININE  --  7.42*  --  9.22*  --  6.11* 6.46* 6.79*  --   --   LATICACIDVEN 2.6* 2.6* 3.2*  --  2.3*  --   --   --   --  2.7*    Estimated Creatinine Clearance: 12.6 mL/min (by C-G formula based on SCr of 6.79 mg/dL (H)).    Allergies  Allergen Reactions  . Pork-Derived Products Other (See Comments)    No pork products for religious reasons   Antimicrobials this admission:  Zosyn 1/17 >>  Vancomycin 1/17 >>   Dose adjustments this admission:  n/a  Microbiology results:  1/17 BCx: ngtd 1/17 UCx: sent  1/17 Sputum: sent  1/17 Resp Panel: Rosezena Sensor, PharmD, BCPS Clinical Pharmacist Pager [phone] 09/26/2016 2:38 PM

## 2016-09-26 NOTE — Progress Notes (Signed)
Patient Name: Trevor [name] Date of Encounter: 09/26/2016  Primary Cardiologist: Dr. Lenise Arena Problem List     Active Problems:   Disorder of phosphorus metabolism   Iron deficiency anemia   Essential hypertension   GERD (gastroesophageal reflux disease)   DVT of lower extremity (deep venous thrombosis) RLE   Pulmonary embolism (HCC)   DCM- EF 25-30% by echo 09/04/14   Ascites   Hepatic cirrhosis (HCC)   Hepatosplenomegaly   Chronic combined systolic and diastolic heart failure (HCC)   End-stage renal disease on hemodialysis (HCC)   Elevated troponin   Sepsis (HCC)   Acute respiratory failure with hypoxia (HCC)   Altered mental status     Subjective   Pt with hypoglycemia X 2 episodes confusion with both episodes.  Fever last night. Today sleepy with dialysis  Inpatient Medications    Scheduled Meds: . digoxin  0.125 mg Oral Daily  . doxercalciferol  3 mcg Intravenous Q M,W,F-HD  . guaiFENesin  1,200 mg Oral BID  . midodrine  10 mg Oral BID WC  . piperacillin-tazobactam (ZOSYN)  IV  3.375 g Intravenous Q12H  . sevelamer carbonate  800 mg Oral TID WC  . vancomycin  1,000 mg Intravenous Q T,Th,Sa-HD   Continuous Infusions: . sodium chloride 10 mL/hr at 09/25/16 0320   PRN Meds: sodium chloride, sodium chloride, acetaminophen, albuterol, alteplase, dextrose, ipratropium-albuterol, lidocaine (PF), lidocaine-prilocaine, pentafluoroprop-tetrafluoroeth   Vital Signs    Vitals:   09/26/16 0728 09/26/16 0733 09/26/16 0805 09/26/16 0830  BP: (!) 71/41 (!) 80/40 (!) 99/53 (!) 96/53  Pulse: 98 100 (!) 102 100  Resp: 17 18 17 18   Temp: 98 F (36.7 C)     TempSrc: Oral     SpO2: 100%     Weight: 198 lb 6.6 oz (90 kg)     Height:        Intake/Output Summary (Last 24 hours) at 09/26/16 0901 Last data filed at 09/26/16 0700  Gross per 24 hour  Intake              784 ml  Output             3570 ml  Net            -2786 ml   Filed Weights   09/25/16 0955 09/25/16 1412 09/26/16 0728  Weight: 197 lb 5 oz (89.5 kg) 189 lb 9.5 oz (86 kg) 198 lb 6.6 oz (90 kg)    Physical Exam    GEN: Well nourished,  in no acute distress.  HEENT: normocephalic, sclera clear, mucus membranes moist.  Neck: Supple, no JVD. Cardiac: irreg irreg, no murmurs, rubs, or gallops. No clubbing, cyanosis, edema.  Radials/DP/PT 2+ and equal bilaterally.  Respiratory:  Respirations regular and mildly labored, with  rhonchi and wheezes. GI: Abd -Soft, nontender, nondistended, BS + x 4. MS: no deformity or atrophy. Skin: warm and dry, brisk capillary refill, no obvious rash Neuro:  Alert and oriented MAE, follows commands Psych: answers questions appropriately,Normal and pleasant affect.   Labs    CBC  Recent Labs  Oct 07, 2016 1815  09/25/16 2128 09/26/16 0355 09/26/16 0745  WBC 10.1  < > 11.6* 11.2* 11.6*  NEUTROABS 9.1*  --  10.6*  --   --   HGB 12.3*  < > 11.7* 11.8* 12.3*  HCT 35.6*  < > 34.4* 34.7* 36.1*  MCV 78.8  < > 78.7 78.9 79.2  PLT 85*  < >  81* 72* 76*  < > = values in this interval not displayed. Basic Metabolic Panel  Recent Labs  09/26/16 0355 09/26/16 0740  NA 135 135  K 4.2 4.1  CL 96* 97*  CO2 25 25  GLUCOSE 108* 92  BUN 51* 54*  CREATININE 6.46* 6.79*  CALCIUM 9.0 9.3  PHOS 4.0 4.1   Liver Function Tests  Recent Labs  09/25/16 0407 09/25/16 2128 09/26/16 0355 09/26/16 0740  AST 62* 53*  --   --   ALT 24 23  --   --   ALKPHOS 126 113  --   --   BILITOT 6.8* 6.7*  --   --   PROT 7.4 7.1  --   --   ALBUMIN 2.3* 2.3* 2.2* 2.2*   No results for input(s): LIPASE, AMYLASE in the last 72 hours. Cardiac Enzymes  Recent Labs  09/25/16 1500 09/25/16 2128 09/26/16 0355  TROPONINI 0.21* 0.22* 0.31*   BNP Invalid input(s): POCBNP D-Dimer No results for input(s): DDIMER in the last 72 hours. Hemoglobin A1C No results for input(s): HGBA1C in the last 72 hours. Fasting Lipid Panel No results for input(s):  CHOL, HDL, LDLCALC, TRIG, CHOLHDL, LDLDIRECT in the last 72 hours. Thyroid Function Tests No results for input(s): TSH, T4TOTAL, T3FREE, THYROIDAB in the last 72 hours.  Invalid input(s): FREET3  Telemetry    A fib improved rate control 90-108 - Personally Reviewed   Radiology    Dg Chest 2 View  Result Date: 09/25/2016 CLINICAL DATA:  Shortness of breath.  Cough and sepsis. EXAM: CHEST  2 VIEW COMPARISON:  10/17/2016; 09/18/2016; 03/23/2016 ; 11/13/2015; chest CT -11/13/2025 FINDINGS: Grossly unchanged enlarged cardiac silhouette and mediastinal contours with masslike prominence of the right hilum compatible with known mediastinal lymphadenopathy. Atherosclerotic plaque within the thoracic aorta. Ill-defined heterogeneous airspace opacities within the bilateral mid and lower lungs are unchanged the slightly progressed in the interval. Pulmonary vasculature remains indistinct. Suspected trace right-sided effusion, unchanged. No pneumothorax. Unchanged bones. IMPRESSION: 1. No change to slight progression of bilateral mid and lower lung heterogeneous airspace opacities with differential considerations including asymmetric alveolar pulmonary edema versus progression of multifocal infection, including atypical etiologies. Clinical correlation is advised. 2. Similar findings of mediastinal adenopathy, similar to remote chest CT performed 11/2015. 3. Cardiomegaly. 4.  Aortic Atherosclerosis (ICD10-170.0) Electronically Signed   By: Simonne Come M.D.   On: 09/25/2016 07:56   Ct Head Wo Contrast  Result Date: October 17, 2016 CLINICAL DATA:  Altered mental status EXAM: CT HEAD WITHOUT CONTRAST TECHNIQUE: Contiguous axial images were obtained from the base of the skull through the vertex without intravenous contrast. COMPARISON:  03/24/2016 FINDINGS: Brain: No evidence of acute infarction, hemorrhage, hydrocephalus, extra-axial collection or mass lesion/mass effect. Periventricular white matter low attenuation  as can be seen with microvascular disease. Generalized cerebral atrophy. Vascular: No hyperdense vessel or unexpected calcification. Skull: No osseous abnormality. Sinuses/Orbits: Mucous retention cyst in bilateral maxillary sinuses. Visualized mastoid sinuses are clear. Visualized orbits demonstrate no focal abnormality. Other: None IMPRESSION: 1. No acute intracranial pathology. Electronically Signed   By: Elige Ko   On: 10-17-2016 14:22   Dg Chest Portable 1 View  Result Date: 17-Oct-2016 CLINICAL DATA:  Cough, congestion, pneumonia, sepsis EXAM: PORTABLE CHEST 1 VIEW COMPARISON:  09/18/2016 FINDINGS: Mild patchy right lower lobe opacity. No frank interstitial edema. No pleural effusion or pneumothorax. Cardiomegaly. IMPRESSION: Mild patchy right lower lobe opacity, atelectasis versus pneumonia. Electronically Signed   By: Roselie Awkward.D.  On: 13-Oct-2016 14:51    Cardiac Studies   CARDIAC CATH 11/2015 Procedures   Left Heart Cath  Right Heart Cath  Conclusion    Mid RCA lesion, 20% stenosed.  There is severe left ventricular systolic dysfunction.   Significant elevation of right sided heart pressures with moderately severe pulmonary hypertension with a PA pressure of 55/29 and a mean pressure 42.  Normal LVEDP at 11-12 mm with a PCWP pressure mean of 12.  No significant coronary obstructive disease with smooth 20% narrowing in the dominant RCA and normal left coronary circulation.  Severe left ventricular dysfunction with global hypokinesis and an ejection fraction of 15 to less than 20%.   Indications   Congestive dilated cardiomyopathy (HCC) [I42.0 (ICD-10-CM)]  Biventricular failure [I50.9 (ICD-10-CM)]   Hemo Data   Flowsheet Row Most Recent Value  Fick Cardiac Output 4.73 L/min  Fick Cardiac Output Index 2.2 (L/min)/BSA  Thermal Cardiac Output 3.14 L/min  Thermal Cardiac Output Index 1.46 (L/min)/BSA  RA A Wave 22 mmHg  RA V Wave 34 mmHg  RA Mean 24  mmHg  RV Systolic Pressure 52 mmHg  RV Diastolic Pressure 8 mmHg  RV EDP 18 mmHg  PA Systolic Pressure 51 mmHg  PA Diastolic Pressure 21 mmHg  PA Mean 33 mmHg  PW A Wave 12 mmHg  PW V Wave 13 mmHg  PW Mean 11 mmHg  AO Systolic Pressure 110 mmHg  AO Diastolic Pressure 77 mmHg  AO Mean 93 mmHg  LV Systolic Pressure 103 mmHg  LV Diastolic Pressure 8 mmHg  LV EDP 13 mmHg  Arterial Occlusion Pressure Extended Systolic Pressure 113 mmHg  Arterial Occlusion Pressure Extended Diastolic Pressure 76 mmHg  Arterial Occlusion Pressure Extended Mean Pressure 93 mmHg  Left Ventricular Apex Extended Systolic Pressure 105 mmHg  Left Ventricular Apex Extended Diastolic Pressure 7 mmHg  Left Ventricular Apex Extended EDP Pressure 11 mmHg  TPVR Index 28.76 HRUI  TSVR Index 63.67 HRUI  PVR SVR Ratio 0.42  TPVR/TSVR Ratio 0.45      Patient Profile     66 YOM with a history of HTN, ESRD on HD (T,TH,Sat), chronic A. Fib, prior history of DVT/PE, GERD, gout, combined systolic and diastolic heart failure, OSA, syncopy, hepatosplenomegaly, CAD status post remote MI in 1990, chronic abdominal pain.   With previous AFib he had DCCV 08/29/13 but developed a large hematoma on his back.  Has not been on anticoagulation since and has not wanted. Now permanent a fib. Last cath 11/2015 with minimal CAD.   EF 15-20% on cath.    Assessment & Plan    Chronic/permanent  a fib  Rate elevated but improving,  has been chronically difficult to give meds due to low BP,  dig very low dose added with dialysis and will need to monitor closely with plan to decrease to 0.0625 daily once rate controlled- ? tomorrow. .  CHA2DS2VASc score = 2.pt refuses anticoagulation.  Elevated troponins with non obstructive CAD in March of this year.  troponins most likely elevated due to renal failure and sepsis and demand ischemia.  CAD  20 % RCA disease- last cath 11/2015   HCAP treated by IM on ABX - some fever  overnight.  Hypotension with sepsis   NICM with last EF on cath 15-20% in 11/2015 but Echo at that time 30-35%. He has had low EF since 2015.    ESRD on HD followed by Renal.  Signed, Nada Boozer, NP  09/26/2016, 9:01 AM  [facility] Pager [phone]  After 5 or weekends [phone]  The patient was seen, examined and discussed with Nada Boozer, NP and I agree with the above.   66 yo male with ESRD on HD, chronic persistent A. Fib, prior history of DVT/PE, GERD, gout, combined systolic and diastolic heart failure, OSA, syncopy, hepatosplenomegaly, CAD status post remote MI in 1990, chronic abdominal pain.   With previous AFib he had DCCV 08/29/13 but developed a large hematoma on his back.  Has not been on anticoagulation since and has not wanted. TTE on 11/13/15 showed  LVEF 30-35%, diffuse hypokinesis, aortic root dilation to 39 mm, dilation of ascending aorta, moderately dilated right ventricle, moderately dilated right atrium, PA peak pressure of moderately increased to 55 mmHg. Trival pericardial effusion noted. He had cardiac cath 11/15/15 with mid RCA 20%.  Difficult to add meds with HD.  Pt was supposed to receive 30 day event monitor for syncope but never rec'd.  No cardiology follow up.  The patient was admitted with acute respiratory failure with hypoxia (URI) and fluid overload managed by HD. Also paracentesis on 09/19/16. Today he deteriorated, worsening acute on chronic respiratory failure with increased lactic acid, the patient just got intubated. PCCM suspicious for pneumonitis and sepsis.   He continues to be in a-fib with RVR, rates now better controlled around 100, however this might be the case until ihis underlying pneumonitis/sepsis is improved. We are unable to use any AV nodal blocking agents sec to hypotension. Troponin elevation sec to demand ischemia and ESRD. No ischemic workup at this point. No heparin.   Switch Digoxin to 0.065  considering ESRD on HD.  Tobias Alexander, MD 09/26/2016

## 2016-09-26 NOTE — Progress Notes (Signed)
RT attempted radial aline X2 without success. Resident aware. RT will continue to monitor.

## 2016-09-26 NOTE — Procedures (Signed)
I have seen and examined this patient and agree with the plan of care  BP 96/46  Hb 12.3   Ca 9.3  Phos 4.1  Alb 2.2    1/15  Blood cultures positive  Vancomycin and Zosyn   Trevor [name] is a 66 y.o. male admitted with acute respiratory failure from HCAP and sepsis .He has ESRD  [facility] . He presented with  SOB, fever, cough, confusion and decreased sp02 to 80s. He had EKG a fib wit RVR at 121 no changes from 3/2017and lower ext edema.  Noted he had US paracentesis on the 12th as outpt. Sec to his HO NICM with last EF on cath 15-20% / RHF with recurrent ascites . [facility]ed [facility] seen 09/22/16 by [name]  With low grade temp and uri symptoms  BC were drawn. He reports progressive sob and weakness      Lathon Adan W 09/26/2016, 9:56 AM

## 2016-09-27 ENCOUNTER — Inpatient Hospital Stay (HOSPITAL_COMMUNITY): Payer: Medicare Other

## 2016-09-27 LAB — COMPREHENSIVE METABOLIC PANEL
ALBUMIN: 2.2 g/dL — AB (ref 3.5–5.0)
ALT: 22 U/L (ref 17–63)
AST: 41 U/L (ref 15–41)
Alkaline Phosphatase: 102 U/L (ref 38–126)
Anion gap: 16 — ABNORMAL HIGH (ref 5–15)
BILIRUBIN TOTAL: 6.8 mg/dL — AB (ref 0.3–1.2)
BUN: 38 mg/dL — AB (ref 6–20)
CHLORIDE: 98 mmol/L — AB (ref 101–111)
CO2: 23 mmol/L (ref 22–32)
Calcium: 9.3 mg/dL (ref 8.9–10.3)
Creatinine, Ser: 5.17 mg/dL — ABNORMAL HIGH (ref 0.61–1.24)
GFR calc Af Amer: 12 mL/min — ABNORMAL LOW (ref >60)
GFR calc non Af Amer: 11 mL/min — ABNORMAL LOW (ref >60)
GLUCOSE: 67 mg/dL (ref 65–99)
POTASSIUM: 3.8 mmol/L (ref 3.5–5.1)
SODIUM: 137 mmol/L (ref 135–145)
TOTAL PROTEIN: 7.1 g/dL (ref 6.5–8.1)

## 2016-09-27 LAB — GLUCOSE, CAPILLARY
GLUCOSE-CAPILLARY: 63 mg/dL — AB (ref 65–99)
GLUCOSE-CAPILLARY: 65 mg/dL (ref 65–99)
GLUCOSE-CAPILLARY: 128 mg/dL — AB (ref 65–99)
GLUCOSE-CAPILLARY: 77 mg/dL (ref 65–99)
GLUCOSE-CAPILLARY: 73 mg/dL (ref 65–99)
GLUCOSE-CAPILLARY: 77 mg/dL (ref 65–99)
GLUCOSE-CAPILLARY: 96 mg/dL (ref 65–99)
Glucose-Capillary: 70 mg/dL (ref 65–99)
Glucose-Capillary: 43 mg/dL — CL (ref 65–99)
Glucose-Capillary: 70 mg/dL (ref 65–99)
Glucose-Capillary: 93 mg/dL (ref 65–99)

## 2016-09-27 LAB — PHOSPHORUS
PHOSPHORUS: 4.6 mg/dL (ref 2.5–4.6)
PHOSPHORUS: 5 mg/dL — AB (ref 2.5–4.6)

## 2016-09-27 LAB — POCT I-STAT 3, ART BLOOD GAS (G3+)
ACID-BASE EXCESS: 2 mmol/L (ref 0.0–2.0)
BICARBONATE: 26.6 mmol/L (ref 20.0–28.0)
O2 Saturation: 97 %
PH ART: 7.431 (ref 7.350–7.450)
PO2 ART: 84 mmHg (ref 83.0–108.0)
TCO2: 28 mmol/L (ref 0–100)
pCO2 arterial: 39.9 mmHg (ref 32.0–48.0)

## 2016-09-27 LAB — TROPONIN I
TROPONIN I: 0.17 ng/mL — AB (ref <0.03)
TROPONIN I: 0.26 ng/mL — AB (ref <0.03)
TROPONIN I: 0.11 ng/mL — AB (ref <0.03)
Troponin I: 0.14 ng/mL (ref <0.03)

## 2016-09-27 LAB — CBC
HEMATOCRIT: 36.5 % — AB (ref 39.0–52.0)
Hemoglobin: 12.2 g/dL — ABNORMAL LOW (ref 13.0–17.0)
MCH: 26.6 pg (ref 26.0–34.0)
MCHC: 33.4 g/dL (ref 30.0–36.0)
MCV: 79.5 fL (ref 78.0–100.0)
Platelets: 83 10*3/uL — ABNORMAL LOW (ref 150–400)
RBC: 4.59 MIL/uL (ref 4.22–5.81)
RDW: 17.5 % — AB (ref 11.5–15.5)
WBC: 9.5 10*3/uL (ref 4.0–10.5)

## 2016-09-27 LAB — PROCALCITONIN: PROCALCITONIN: 35.92 ng/mL

## 2016-09-27 LAB — MAGNESIUM
MAGNESIUM: 2.2 mg/dL (ref 1.7–2.4)
Magnesium: 2.2 mg/dL (ref 1.7–2.4)

## 2016-09-27 LAB — TRIGLYCERIDES: Triglycerides: 68 mg/dL (ref <150)

## 2016-09-27 MED ORDER — DEXTROSE 50 % IV SOLN
1.0000 | Freq: Once | INTRAVENOUS | Status: AC
  Administered 2016-09-27: 50 mL via INTRAVENOUS
  Filled 2016-09-27: qty 50

## 2016-09-27 MED ORDER — DEXTROSE 50 % IV SOLN
INTRAVENOUS | Status: AC
  Filled 2016-09-27: qty 50

## 2016-09-27 MED ORDER — PROPOFOL 1000 MG/100ML IV EMUL
5.0000 ug/kg/min | INTRAVENOUS | Status: DC
  Administered 2016-09-28 – 2016-09-29 (×3): 10 ug/kg/min via INTRAVENOUS
  Administered 2016-09-29: 7 ug/kg/min via INTRAVENOUS
  Administered 2016-09-30: 15 ug/kg/min via INTRAVENOUS
  Administered 2016-09-30: 10 ug/kg/min via INTRAVENOUS
  Administered 2016-10-01: 15 ug/kg/min via INTRAVENOUS
  Administered 2016-10-01: 17 ug/kg/min via INTRAVENOUS
  Administered 2016-10-02: 12 ug/kg/min via INTRAVENOUS
  Administered 2016-10-02 – 2016-10-03 (×4): 20 ug/kg/min via INTRAVENOUS
  Administered 2016-10-04: 15 ug/kg/min via INTRAVENOUS
  Administered 2016-10-04: 15.048 ug/kg/min via INTRAVENOUS
  Administered 2016-10-05: 17 ug/kg/min via INTRAVENOUS
  Filled 2016-09-27 (×16): qty 100

## 2016-09-27 MED ORDER — VITAL HIGH PROTEIN PO LIQD: 1000.0000 mL | ORAL | Status: DC

## 2016-09-27 MED ORDER — PRO-STAT SUGAR FREE PO LIQD
30.0000 mL | Freq: Three times a day (TID) | ORAL | Status: DC
  Administered 2016-09-27 – 2016-10-03 (×18): 30 mL
  Filled 2016-09-27 (×19): qty 30

## 2016-09-27 MED ORDER — VITAL 1.5 CAL PO LIQD
1000.0000 mL | ORAL | Status: DC
Start: 2016-09-27 — End: 2016-10-03
  Administered 2016-09-27 – 2016-10-02 (×6): 1000 mL
  Filled 2016-09-27 (×10): qty 1000

## 2016-09-27 MED ORDER — DEXTROSE 50 % IV SOLN
INTRAVENOUS | Status: AC
  Administered 2016-09-27: 25 mL
  Filled 2016-09-27: qty 50

## 2016-09-27 NOTE — Consult Note (Signed)
PULMONARY / CRITICAL CARE MEDICINE   Name: Trevor [name] MRN: [id number] DOB: 16-Apr-1951    ADMISSION DATE:  09/19/2016 CONSULTATION DATE:  1/19  REFERRING MD:  Sloan Leiter  CHIEF COMPLAINT:   Progressive acute hypoxic respiratory failure, SIRS/sepsis AND worsening Encephalopathy   brief 66 year old male w/ sig h/o systolic CM (EF 73-41%), AF, (not on AC)  ESRD TTS, and cardiac related cirrhosis. Admitted 1/17 w/ 3 d h/o URI symptoms, temp as high as 103, worsening malaise and progressive dyspnea. The day of admit his wife found him to be disoriented. On arrival to the ER his room air sats were in 80s. His MS and sats improved w/ oxygen. He was admitted w/ working dx of HCAP. Placed on broad spec abx and admitted to the SDU. From 1/17 to 1/19 his confusion actually continued to worsen. His RVP PCR was positive for Coronavirus. He underwent HD X 2 (From time of admit to 1/19). When he returned to the SDU on 1/19 after HD (net -~2.5 L) he was found to be more confused, post HS CXR w/ progression of bilateral R>L infiltrates and O2 requirements up to 5 liters w/ RR in 30s to 40s. PCCM was asked to see given concern that his clinical course had been deteriorating.   STUDIES:  10/08/2016 - admit ECHO 1/19>>>ef 45% RVP PCR 1/18: Coronavirus HKUI BCX2 1/17>>> BAL 1/19 >>> Peritoneal 1/19>>>   SUBJECTIVE/OVERNIGHT/INTERVAL HX 1/20 - on fent 250 and gets agitated with sedation wean wua. On neo 100. Also hypoglycemic on d5 at 30cc./h   VITAL SIGNS: BP 90/69   Pulse 81   Temp (!) 100.7 F (38.2 C) (Oral)   Resp (!) 28   Ht _0  (1.854 m)   Wt 87.5 kg (192 lb 14.4 oz)   SpO2 95%   BMI 25.45 kg/m  5 liters  HEMODYNAMICS: CVP:  [12 mmHg-18 mmHg] 13 mmHg  VENTILATOR SETTINGS: Vent Mode: PRVC FiO2 (%):  [40 %-100 %] 40 % Set Rate:  [28 bmp] 28 bmp Vt Set:  [480 mL] 480 mL PEEP:  [8 cmH20] 8 cmH20 Plateau Pressure:  [21 cmH20-22 cmH20] 22 cmH20  INTAKE / OUTPUT:  Intake/Output  Summary (Last 24 hours) at 09/27/16 1042 Last data filed at 09/27/16 0800  Gross per 24 hour  Intake             2361 ml  Output             2500 ml  Net             -139 ml     General appearance:  66 Year old  Male, intubated Eyes: anicteric,  moist conjunctivae; PERRL, EOMI bilaterally. Mouth:  membranes and no mucosal ulcerations; normal hard and soft palate Neck: Trachea midline; neck supple, no JVD Lungs/chest: intubated and sedated. Sync with vent CV: tachy irreg w/ IV/VI holosystolic murmur. LUE AVG w/ good bruit and thrill  Abdomen: distended and firm but  non-tender; no masses or HSM Extremities: No peripheral edema or extremity lymphadenopathy Skin: warm temperature, turgor and texture nml; old healed papular rash on back, no ulcers or subcutaneous nodules Psych: rass -3 on sedation  gtt  LABS: PULMONARY  Recent Labs Lab 09/21/2016 1201 09/26/16 1753 09/27/16 0342  PHART  --  7.475* 7.431  PCO2ART  --  40.6 39.9  PO2ART  --  312.0* 84.0  HCO3 27.2 29.9* 26.6  TCO2 _1 O2SAT 87.0 100.0 97.0  CBC  Recent Labs Lab 09/26/16 0355 09/26/16 0745 09/27/16 0259  HGB 11.8* 12.3* 12.2*  HCT 34.7* 36.1* 36.5*  WBC 11.2* 11.6* 9.5  PLT 72* 76* 83*    COAGULATION  Recent Labs Lab 10/07/2016 1815  INR 1.53    CARDIAC   Recent Labs Lab 09/25/16 2128 09/26/16 0355 09/26/16 0745 09/26/16 1630 09/27/16 0259  TROPONINI 0.22* 0.31* 0.21* 0.18* 0.14*   No results for input(s): PROBNP in the last 168 hours.   CHEMISTRY  Recent Labs Lab 09/25/16 0407 09/25/16 2128 09/26/16 0355 09/26/16 0740 09/27/16 0259  NA 138 134* 135 135 137  K 5.4* 4.5 4.2 4.1 3.8  CL 100* 96* 96* 97* 98*  CO2 _0 GLUCOSE 91 111* 108* 92 67  BUN 84* 45* 51* 54* 38*  CREATININE 9.22* 6.11* 6.46* 6.79* 5.17*  CALCIUM 9.2 8.9 9.0 9.3 9.3  PHOS  --   --  4.0 4.1  --    Estimated Creatinine Clearance: 16.5 mL/min (by C-G formula based on SCr of 5.17 mg/dL  (H)).   LIVER  Recent Labs Lab 09/23/2016 1120 10/08/2016 1815 09/25/16 0407 09/25/16 2128 09/26/16 0355 09/26/16 0740 09/27/16 0259  AST 74* 67* 62* 53*  --   --  41  ALT _1 --   --  22  ALKPHOS 141* 132* 126 113  --   --  102  BILITOT 6.0* 6.1* 6.8* 6.7*  --   --  6.8*  PROT 7.8 7.2 7.4 7.1  --   --  7.1  ALBUMIN 2.5* 2.3* 2.3* 2.3* 2.2* 2.2* 2.2*  INR  --  1.53  --   --   --   --   --      INFECTIOUS  Recent Labs Lab 09/18/2016 1815  09/25/16 0708 09/26/16 1237 09/26/16 1629 09/26/16 1822 09/27/16 0259  LATICACIDVEN 2.6*  < > 2.3* 2.7* 2.1*  --   --   PROCALCITON 50.54  --   --   --   --  37.98 35.92  < > = values in this interval not displayed.   ENDOCRINE CBG (last 3)   Recent Labs  09/27/16 0532 09/27/16 0818 09/27/16 1004  GLUCAP 128* 70 43*         IMAGING x48h  - image(s) personally visualized  -   highlighted in bold Dg Chest Port 1 View  Result Date: 09/26/2016 CLINICAL DATA:  Central line placement EXAM: PORTABLE CHEST 1 VIEW COMPARISON:  09/26/2016 1200 hours FINDINGS: Endotracheal tube placed. The tip is 2.7 cm from the carina. NG tube tip is beyond the gastroesophageal junction. Right jugular central venous catheter. Tip is at the upper SVC. No pneumothorax. Diffuse bilateral airspace disease is stable. Low volumes. Cardiomegaly. Right paratracheal opacity is nonspecific. IMPRESSION: Endotracheal and NG tubes. Right jugular venous catheter tip in the upper SVC and no pneumothorax. Stable diffuse bilateral airspace disease. Electronically Signed   By: Marybelle Killings M.D.   On: 09/26/2016 17:03   Dg Chest Port 1 View  Result Date: 09/26/2016 EXAM: PORTABLE CHEST 1 VIEW COMPARISON:  09/25/2016 FINDINGS: There has been slight progression of bilateral pulmonary edema. Chronic cardiomegaly. No visible effusions.  No acute bone abnormality. IMPRESSION: Slight progression of bilateral pulmonary infiltrates, most consistent with pulmonary edema.  Electronically Signed   By: Lorriane Shire M.D.   On: 09/26/2016 13:00        ANTIBIOTICS: vanc 1/17>> Zosyn 1/17>>> levaquin 1/19>>>  SIGNIFICANT  EVENTS:   LINES/TUBES:   DISCUSSION: 81 yom w/ advanced systolic CM (EF 33-38%), ESRD, and AF. Admitted 1/17 w/ viral PNA +/- HCAP. Now w/ progressive hypoxia, pulmonary infiltrates and respiratory failure. Looks like evolving ALI w/ persistent SIRS response.-ARDS protocol -add atypical coverage  Ck lactic acid and CVP  ASSESSMENT / PLAN:  PULMONARY A: Acute hypoxic Respiratory failure in the setting of progressive and bilateral pulmonary infiltrates.  Presume viral Pneumonitis +/- HCAP  W/ progressive ARDS    - on 40% fio2/peep 8  P:   ARDS protocol Bronch for BAL PAD protocol  F/u abg and PCXR Para if has sig ascites to decrease C-abd  CARDIOVASCULAR A:  Af w/ RVR SIRS/sepsis  Troponin elevation  Chronic systolic HF w/ EF 32-91%   - on neo 100 P:  Tele CVP goal 8-12 MAP >60 Volume/pressors as indicated   RENAL A:   ESRD-TTS P:   Renal following F/u chemistry   GASTROINTESTINAL A:   Hepatic congestion/ w/ hepatonephromegaly w/ presumed cardiac related Cirrhosis (has had prior paracentesis on 1/12) Ascites    - nil acute  P:   eval abd w/ US-->tap to r/o SBP if sig fluid; also would decrease WOB  HEMATOLOGIC A:   thrombocytopenia  Remote DVT P:  Trend CBC Transfuse per protocol   INFECTIOUS A:   Viral PNA + Coronavirus HKU1 HCAP Sirs/sepsis  P:   BAL Check HIV Cont current abx as above   ENDOCRINE A:   Hypoglycemia   - ongoing hypogluycemia P:   Cont D5 but increase Cont cbgs  Avoid SSI unless consistently > 200  NEUROLOGIC A:   Acute encephalopathy in setting of sepsis   - RASS -3 on fent gtt P:   RASS goal: -1 to -2 PAD protocol   FAMILY  - Updates: son at bedside updated 09/27/2016   - Inter-disciplinary family meet or Palliative Care meeting due by:   1/26        The patient is critically ill with multiple organ systems failure and requires high complexity decision making for assessment and support, frequent evaluation and titration of therapies, application of advanced monitoring technologies and extensive interpretation of multiple databases.   Critical Care Time devoted to patient care services described in this note is  30  Minutes. This time reflects time of care of this signee Dr Brand Males. This critical care time does not reflect procedure time, or teaching time or supervisory time of PA/NP/Med student/Med Resident etc but could involve care discussion time    Dr. Brand Males, M.D., [facility].C.P Pulmonary and Critical Care Medicine Staff Physician [facility] Pager: [phone], If no answer or between  15:00h - 7:00h: call [phone]  09/27/2016 10:53 AM

## 2016-09-27 NOTE — Progress Notes (Signed)
 [facility] Progress Note  Interval Note: Patient admitted 09/26/2016 with acute hypoxic respiratory failure, ARDS ,sepsis.  Patients condition progressively deteriorated 09/26/2016 with increased respiratory distress, AMS. He was intubated and placed on mechanical ventilation per [facility] 09/26/2016.    Subjective:  Mechanically ventilated, sedated.   Objective Vitals:   09/27/16 0600 09/27/16 0700 09/27/16 0800 09/27/16 0821  BP: 95/65 (!) 86/67 (!) 83/66 90/69  Pulse: 78 80 84 81  Resp: (!) 28 (!) 28 (!) 28 (!) 28  Temp:    (!) 100.7 F (38.2 C)  TempSrc:    Oral  SpO2: 97% 98% 96% 95%  Weight:      Height:       Physical Exam General: Chronically ill appearing pt, sedated on mechanical ventilation.  Heart: Irregular, G2,E3 3/6 systolic M.  Lungs: Orally intubated on vent, symmetrical chest excursions  decreased in bases, few coarse upper airway breath sounds, otherwise CTA A/P.  Abdomen: soft, nontender, OG tube to LIWS with bloody drainage. Probable ascites present. Extremities: Trace RLE edema, No LLE edema.  Dialysis Access: LUA AVF + bruit. RIJ triple lumen CVL,   Additional Objective Labs: Basic Metabolic Panel:  Recent Labs Lab 09/26/16 0355 09/26/16 0740 09/27/16 0259  NA 135 135 137  K 4.2 4.1 3.8  CL 96* 97* 98*  CO2 _0 GLUCOSE 108* 92 67  BUN 51* 54* 38*  CREATININE 6.46* 6.79* 5.17*  CALCIUM 9.0 9.3 9.3  PHOS 4.0 4.1  --    Liver Function Tests:  Recent Labs Lab 09/25/16 0407 09/25/16 2128 09/26/16 0355 09/26/16 0740 09/27/16 0259  AST 62* 53*  --   --  41  ALT 24 23  --   --  22  ALKPHOS 126 113  --   --  102  BILITOT 6.8* 6.7*  --   --  6.8*  PROT 7.4 7.1  --   --  7.1  ALBUMIN 2.3* 2.3* 2.2* 2.2* 2.2*  CBC  Recent Labs Lab 09/25/2016 1120 09/23/2016 1815 09/25/16 0407 09/25/16 2128 09/26/16 0355 09/26/16 0745 09/27/16 0259  WBC 12.0* 10.1 12.3* 11.6* 11.2* 11.6* 9.5  NEUTROABS 11.1* 9.1*  --  10.6*  --   --    --   HGB 12.5* 12.3* 11.7* 11.7* 11.8* 12.3* 12.2*  HCT 36.7* 35.6* 34.3* 34.4* 34.7* 36.1* 36.5*  MCV 79.3 78.8 79.2 78.7 78.9 79.2 79.5  PLT 83* 85* 83* 81* 72* 76* 83*   Blood Culture    Component Value Date/Time   SDES BRONCHIAL ALVEOLAR LAVAGE 09/26/2016 1709   SPECREQUEST RIGHT 09/26/2016 1709   CULT PENDING 09/26/2016 1709   REPTSTATUS PENDING 09/26/2016 1709    Cardiac Enzymes:  Recent Labs Lab 09/25/16 2128 09/26/16 0355 09/26/16 0745 09/26/16 1630 09/27/16 0259  TROPONINI 0.22* 0.31* 0.21* 0.18* 0.14*   CBG:  Recent Labs Lab 09/26/16 2342 09/27/16 0339 09/27/16 0501 09/27/16 0532 09/27/16 0818  GLUCAP 82 63* 65 128* 70    Studies/Results: Dg Chest Port 1 View  Result Date: 09/26/2016 CLINICAL DATA:  Central line placement EXAM: PORTABLE CHEST 1 VIEW COMPARISON:  09/26/2016 1200 hours FINDINGS: Endotracheal tube placed. The tip is 2.7 cm from the carina. NG tube tip is beyond the gastroesophageal junction. Right jugular central venous catheter. Tip is at the upper SVC. No pneumothorax. Diffuse bilateral airspace disease is stable. Low volumes. Cardiomegaly. Right paratracheal opacity is nonspecific. IMPRESSION: Endotracheal and NG tubes. Right jugular venous catheter tip in the upper SVC and  no pneumothorax. Stable diffuse bilateral airspace disease. Electronically Signed   By: Marybelle Killings M.D.   On: 09/26/2016 17:03   Dg Chest Port 1 View  Result Date: 09/26/2016 EXAM: PORTABLE CHEST 1 VIEW COMPARISON:  09/25/2016 FINDINGS: There has been slight progression of bilateral pulmonary edema. Chronic cardiomegaly. No visible effusions.  No acute bone abnormality. IMPRESSION: Slight progression of bilateral pulmonary infiltrates, most consistent with pulmonary edema. Electronically Signed   By: Lorriane Shire M.D.   On: 09/26/2016 13:00   Medications: . sodium chloride 10 mL/hr at 09/25/16 0320  . dextrose 5 % and 0.9% NaCl 30 mL/hr at 09/27/16 0800  . fentaNYL  infusion INTRAVENOUS 100 mcg/hr (09/27/16 0800)  . phenylephrine (NEO-SYNEPHRINE) Adult infusion 100 mcg/min (09/27/16 0700)   . chlorhexidine gluconate (MEDLINE KIT)  15 mL Mouth Rinse BID  . digoxin  0.0625 mg Per Tube Daily  . doxercalciferol  3 mcg Intravenous Q M,W,F-HD  . famotidine (PEPCID) IV  20 mg Intravenous Q24H  . [START ON 09/28/2016] levofloxacin (LEVAQUIN) IV  500 mg Intravenous Q48H  . mouth rinse  15 mL Mouth Rinse QID  . midodrine  10 mg Per Tube TID WC  . piperacillin-tazobactam (ZOSYN)  IV  3.375 g Intravenous Q12H  . sevelamer carbonate  800 mg Per Tube TID WC   Dialysis Orders: Center: [facility]   . EDW 89.5 kg HD Bath 2k 2ca  Time 3hr 37mn  Heparin 2000. Access LUA AVF     hec4 mcg IV/HD  Mircera 225 mcg  q 2 weeks(  last given 09/18/16)    Other op labs hgb 8.3, ca 9 phos 7.1 pth 942  29% TFS    Assessment/Plan: 1. Acute hypoxic respiratory failure: Viral pneumonitis vs HCAP. Progressive ARDS. Intubated on vent. Per PCCM.  2. SIRS/Sepsis: BAL done yesterday culture pending. Gram stain-GPC, GNR. Vanc, zosyn, levaquin per primary. BC NG x 2 days.  WBC 9.5 Lactic acid 2.1 Procalcitonin 35.92 3. ESRD -Had intermittent HD 09/26/2016. Now on pressors. Will most likely require initiation of CRRT. Will need temp HD catheter placed.  4. Anemia - HGB 12.2. Stable. No ESA needed.  5. Secondary hyperparathyroidism - Ca 9.3 C Ca 10.7 NPO at present. Check phos.  6.. HTN/volume - HD yesterday Pre wt 90 kg Net UF 2.5 liters Post wt 87.5 kg. CVP 13-14, currently on neo gtt at 100 mcg/min. SBP 88-90s,  7.. Nutrition -NPO. Severe PCM. Albumin 2.2 Per PCCM.  8. Cardiac related cirrhosis: Probable ascites present. No paracentesis yet. 9. Afib with RVR: Now rate controlled in 80s. Per primary 10. H/O chronic systolic HF 11. Hypoglycemia: Per primary. On D5NS @ 30cc/Hr.     H.  NP-C 09/27/2016, 8:52 AM  [phone]

## 2016-09-27 NOTE — Progress Notes (Signed)
eLink Physician-Brief Progress Note Patient Name: Christo Witts DOB: 1951/04/20 MRN: [id number]   Date of Service  09/27/2016  HPI/Events of Note  Patient on 400 mcg/hr fentanyl and despite that he is difficult to control.   Also RIJ CVC prox ports not working.  Based on cxr yesterday CVC may have not been placed deep enough.  eICU Interventions  Check cxr Propofol in addition to fentanyl as needed to help with agitation and help with patient safety     Intervention Category Minor Interventions: Agitation / anxiety - evaluation and management  Henry Russel, P 09/27/2016, 4:53 PM

## 2016-09-27 NOTE — Progress Notes (Signed)
Patient Name: Trevor [name] Date of Encounter: 09/27/2016  Primary Cardiologist: Dr. Sundra Aland Problem List     Active Problems:   Disorder of phosphorus metabolism   Iron deficiency anemia   Essential hypertension   GERD (gastroesophageal reflux disease)   DVT of lower extremity (deep venous thrombosis) RLE   Pulmonary embolism (HCC)   DCM- EF 25-30% by echo 09/04/14   Ascites   Hepatic cirrhosis (HCC)   Hepatosplenomegaly   Chronic combined systolic and diastolic heart failure (HCC)   End-stage renal disease on hemodialysis (HCC)   Elevated troponin   Sepsis ([place])   Acute respiratory failure with hypoxia (HCC)   Altered mental status   Atrial fibrillation with RVR ([place])   Septic shock (HCC)     Subjective   Pt remains intubated.  Unable to provide history  Inpatient Medications    Scheduled Meds: . chlorhexidine gluconate (MEDLINE KIT)  15 mL Mouth Rinse BID  . digoxin  0.0625 mg Per Tube Daily  . doxercalciferol  3 mcg Intravenous Q M,W,F-HD  . famotidine (PEPCID) IV  20 mg Intravenous Q24H  . feeding supplement (PRO-STAT SUGAR FREE 64)  30 mL Per Tube TID  . [START ON 09/28/2016] levofloxacin (LEVAQUIN) IV  500 mg Intravenous Q48H  . mouth rinse  15 mL Mouth Rinse QID  . midodrine  10 mg Per Tube TID WC  . piperacillin-tazobactam (ZOSYN)  IV  3.375 g Intravenous Q12H  . sevelamer carbonate  800 mg Per Tube TID WC   Continuous Infusions: . sodium chloride 10 mL/hr at 09/25/16 0320  . dextrose 5 % and 0.9% NaCl 50 mL/hr at 09/27/16 1045  . feeding supplement (VITAL 1.5 CAL) 1,000 mL (09/27/16 1419)  . fentaNYL infusion INTRAVENOUS 250 mcg/hr (09/27/16 1000)  . phenylephrine (NEO-SYNEPHRINE) Adult infusion 100 mcg/min (09/27/16 1127)   PRN Meds: Place/Maintain arterial line **AND** sodium chloride, albuterol, bisacodyl, fentaNYL, ipratropium-albuterol, sennosides   Vital Signs    Vitals:   09/27/16 1200 09/27/16 1205 09/27/16 1300 09/27/16  1400  BP: 94/73  (!) 79/63 (!) 86/68  Pulse: 90  97 97  Resp: (!) 28  (!) 28 (!) 28  Temp:  99.8 F (37.7 C)    TempSrc:  Oral    SpO2: 99%  95% 96%  Weight:      Height:        Intake/Output Summary (Last 24 hours) at 09/27/16 1504 Last data filed at 09/27/16 1300  Gross per 24 hour  Intake          2674.33 ml  Output                0 ml  Net          2674.33 ml   Filed Weights   09/25/16 1412 09/26/16 0728 09/26/16 1128  Weight: 189 lb 9.5 oz (86 kg) 198 lb 6.6 oz (90 kg) 192 lb 14.4 oz (87.5 kg)    Physical Exam    GEN: chronically ill,  Sedated on the vent HEENT: ETT in place Neck: Supple, no JVD. Cardiac: irreg irreg  Respiratory:  Coarse upper airway sounds with  rhonchi and wheezes. GI: Abd -Soft, nontender, nondistended, BS + x 4. MS: no deformity or atrophy. Skin: warm and dry, brisk capillary refill, no obvious rash Neuro:  Alert and oriented MAE, follows commands Psych: answers questions appropriately,Normal and pleasant affect.   Labs    CBC  Recent Labs  09/13/2016 1815  09/25/16  2128  09/26/16 0745 09/27/16 0259  WBC 10.1  < > 11.6*  < > 11.6* 9.5  NEUTROABS 9.1*  --  10.6*  --   --   --   HGB 12.3*  < > 11.7*  < > 12.3* 12.2*  HCT 35.6*  < > 34.4*  < > 36.1* 36.5*  MCV 78.8  < > 78.7  < > 79.2 79.5  PLT 85*  < > 81*  < > 76* 83*  < > = values in this interval not displayed. Basic Metabolic Panel  Recent Labs  09/26/16 0355 09/26/16 0740 09/27/16 0259  NA 135 135 137  K 4.2 4.1 3.8  CL 96* 97* 98*  CO2 '25 25 23  ' GLUCOSE 108* 92 67  BUN 51* 54* 38*  CREATININE 6.46* 6.79* 5.17*  CALCIUM 9.0 9.3 9.3  PHOS 4.0 4.1  --    Liver Function Tests  Recent Labs  09/25/16 2128  09/26/16 0740 09/27/16 0259  AST 53*  --   --  41  ALT 23  --   --  22  ALKPHOS 113  --   --  102  BILITOT 6.7*  --   --  6.8*  PROT 7.1  --   --  7.1  ALBUMIN 2.3*  < > 2.2* 2.2*  < > = values in this interval not displayed. No results for input(s):  LIPASE, AMYLASE in the last 72 hours. Cardiac Enzymes  Recent Labs  09/26/16 1630 09/27/16 0259 09/27/16 0959  TROPONINI 0.18* 0.14* 0.17*    Telemetry    A fib improved rate control 90s - Personally Reviewed   Radiology    Dg Chest Port 1 View  Result Date: 09/26/2016 CLINICAL DATA:  Central line placement EXAM: PORTABLE CHEST 1 VIEW COMPARISON:  09/26/2016 1200 hours FINDINGS: Endotracheal tube placed. The tip is 2.7 cm from the carina. NG tube tip is beyond the gastroesophageal junction. Right jugular central venous catheter. Tip is at the upper SVC. No pneumothorax. Diffuse bilateral airspace disease is stable. Low volumes. Cardiomegaly. Right paratracheal opacity is nonspecific. IMPRESSION: Endotracheal and NG tubes. Right jugular venous catheter tip in the upper SVC and no pneumothorax. Stable diffuse bilateral airspace disease. Electronically Signed   By: Marybelle Killings M.D.   On: 09/26/2016 17:03   Dg Chest Port 1 View  Result Date: 09/26/2016 EXAM: PORTABLE CHEST 1 VIEW COMPARISON:  09/25/2016 FINDINGS: There has been slight progression of bilateral pulmonary edema. Chronic cardiomegaly. No visible effusions.  No acute bone abnormality. IMPRESSION: Slight progression of bilateral pulmonary infiltrates, most consistent with pulmonary edema. Electronically Signed   By: Lorriane Shire M.D.   On: 09/26/2016 13:00    Cardiac Studies   CARDIAC CATH 11/2015 Procedures   Left Heart Cath  Right Heart Cath  Conclusion    Mid RCA lesion, 20% stenosed.  There is severe left ventricular systolic dysfunction.   Significant elevation of right sided heart pressures with moderately severe pulmonary hypertension with a PA pressure of 55/29 and a mean pressure 42.  Normal LVEDP at 11-12 mm with a PCWP pressure mean of 12.  No significant coronary obstructive disease with smooth 20% narrowing in the dominant RCA and normal left coronary circulation.  Severe left ventricular  dysfunction with global hypokinesis and an ejection fraction of 15 to less than 20%.   Indications   Congestive dilated cardiomyopathy (HCC) [I42.0 (ICD-10-CM)]  Biventricular failure [I50.9 (ICD-10-CM)]   Hemo Data   Flowsheet Row Most Recent  Value  Fick Cardiac Output 4.73 L/min  Fick Cardiac Output Index 2.2 (L/min)/BSA  Thermal Cardiac Output 3.14 L/min  Thermal Cardiac Output Index 1.46 (L/min)/BSA  RA A Wave 22 mmHg  RA V Wave 34 mmHg  RA Mean 24 mmHg  RV Systolic Pressure 52 mmHg  RV Diastolic Pressure 8 mmHg  RV EDP 18 mmHg  PA Systolic Pressure 51 mmHg  PA Diastolic Pressure 21 mmHg  PA Mean 33 mmHg  PW A Wave 12 mmHg  PW V Wave 13 mmHg  PW Mean 11 mmHg  AO Systolic Pressure 340 mmHg  AO Diastolic Pressure 77 mmHg  AO Mean 93 mmHg  LV Systolic Pressure 370 mmHg  LV Diastolic Pressure 8 mmHg  LV EDP 13 mmHg  Arterial Occlusion Pressure Extended Systolic Pressure 964 mmHg  Arterial Occlusion Pressure Extended Diastolic Pressure 76 mmHg  Arterial Occlusion Pressure Extended Mean Pressure 93 mmHg  Left Ventricular Apex Extended Systolic Pressure 383 mmHg  Left Ventricular Apex Extended Diastolic Pressure 7 mmHg  Left Ventricular Apex Extended EDP Pressure 11 mmHg  TPVR Index 28.76 HRUI  TSVR Index 63.67 HRUI  PVR SVR Ratio 0.42  TPVR/TSVR Ratio 0.45      Patient Profile     66 YOM with a history of HTN, ESRD on HD (T,TH,Sat), chronic A. Fib, prior history of DVT/PE, GERD, gout, combined systolic and diastolic heart failure, OSA, syncopy, hepatosplenomegaly, CAD status post remote MI in 1990, chronic abdominal pain.   With previous AFib he had DCCV 08/29/13 but developed a large hematoma on his back.  Has not been on anticoagulation since and has not wanted. Now permanent a fib with severe atrial enlargement. Last cath 11/2015 with minimal CAD.   EF 15-20% on cath.    The patient was admitted with acute respiratory failure with hypoxia (URI) and fluid overload  managed by HD. Also paracentesis on 09/19/16. He deteriorated, worsening acute on chronic respiratory failure with increased lactic acid, and intubated yesterday.  PCCM suspicious for pneumonitis and sepsis.    Assessment & Plan    Chronic/permanent  a fib   V rates are better controlled No changes  CHA2DS2VASc score = 2.pt refuses anticoagulation.  Consider stopping digoxin given renal failure once able  Elevated troponins with non obstructive CAD in March of this year.  troponins most likely elevated due to renal failure and sepsis and demand ischemia.  No further CV workup planned  CAD  20 % RCA disease- last cath 11/2015   HCAP treated by IM on ABX - no on vent  Hypotension with sepsis   NICM with last EF on cath 15-20% in 11/2015 but Echo at that time 30-35%. He has had low EF since 2015.    ESRD on HD followed by Renal.  Nothing new to add at this time  Cardiology to see again on Monday Please call if we can assist in the interim.  Thompson Grayer, MD 09/27/2016

## 2016-09-27 NOTE — Progress Notes (Addendum)
CRITICAL VALUE ALERT  Critical value received:  43 CBG   Date of notification: 09/27/16  Time of notification: 1004  Critical value read back: Yes  Nurse who received alert: Hazel Sams RN   MD notified (1st page):  MD Marchelle Gearing  Time of first page:  MD On unit   MD notified (2nd page):  Time of second page:  Responding MD: MD Marchelle Gearing  Time MD responded:  1005  Notified MD Ramaswamy in regards to CBG, ordered to give one Ampule of D50.

## 2016-09-27 NOTE — Progress Notes (Signed)
[facility]   Reason for visit: Follow up on Pneumonia  Interval History: intubated yesterday, added levaquin empirically and BAL sent;   CXR independently reviewed, patchy infiltrates  Physical Exam: Constitutional:  Vitals:   09/27/16 0800 09/27/16 0821  BP: (!) 83/66 90/69  Pulse: 84 81  Resp: (!) 28 (!) 28  Temp:  (!) 100.7 F (38.2 C)  intubated, sedated Eyes: anicteric HENT: +ET Respiratory: Normal respiratory effort; CTA B Cardiovascular: Tachy RR GI: soft, nt, nd  Review of Systems: Unable to be assessed due to mental status  Lab Results  Component Value Date   WBC 9.5 09/27/2016   HGB 12.2 (L) 09/27/2016   HCT 36.5 (L) 09/27/2016   MCV 79.5 09/27/2016   PLT 83 (L) 09/27/2016    Lab Results  Component Value Date   CREATININE 5.17 (H) 09/27/2016   BUN 38 (H) 09/27/2016   NA 137 09/27/2016   K 3.8 09/27/2016   CL 98 (L) 09/27/2016   CO2 23 09/27/2016    Lab Results  Component Value Date   ALT 22 09/27/2016   AST 41 09/27/2016   ALKPHOS 102 09/27/2016     Microbiology: Recent Results (from the past 240 hour(s))  Anaerobic culture     Status: None   Collection Time: 09/19/16  3:22 PM  Result Value Ref Range Status   Specimen Description PERITONEAL  Final   Special Requests NONE  Final   Culture   Final    NO ANAEROBES ISOLATED Performed at [facility]    Report Status 09/23/2016 FINAL  Final  Body fluid culture     Status: None   Collection Time: 09/19/16  3:22 PM  Result Value Ref Range Status   Specimen Description PERITONEAL  Final   Special Requests NONE  Final   Gram Stain   Final    FEW WBC PRESENT, PREDOMINANTLY MONONUCLEAR NO ORGANISMS SEEN    Culture   Final    NO GROWTH 3 DAYS Performed at [facility], [address], [place], [place] [address]    Report Status 09/23/2016 FINAL  Final  Acid Fast Smear (AFB)     Status: None   Collection Time: 09/19/16  3:22 PM  Result Value Ref  Range Status   AFB Specimen Processing Concentration  Final   Acid Fast Smear Negative  Final    Comment: (NOTE) Performed At: [facility] [place], [place] [id number] Lindon Romp MD VH:[id number]    Source (AFB) PERITONEAL  Final  Blood Culture (routine x 2)     Status: None (Preliminary result)   Collection Time: 09/12/2016 11:10 AM  Result Value Ref Range Status   Specimen Description BLOOD RIGHT ARM  Final   Special Requests BOTTLES DRAWN AEROBIC AND ANAEROBIC 5CC  Final   Culture NO GROWTH 2 DAYS  Final   Report Status PENDING  Incomplete  Blood Culture (routine x 2)     Status: None (Preliminary result)   Collection Time: 09/30/2016 11:20 AM  Result Value Ref Range Status   Specimen Description BLOOD RIGHT ANTECUBITAL  Final   Special Requests BOTTLES DRAWN AEROBIC AND ANAEROBIC 5CC  Final   Culture NO GROWTH 2 DAYS  Final   Report Status PENDING  Incomplete  Respiratory Panel by PCR     Status: Abnormal   Collection Time: 09/25/16  3:19 PM  Result Value Ref Range Status   Adenovirus NOT DETECTED NOT DETECTED  Final   Coronavirus 229E NOT DETECTED NOT DETECTED Final   Coronavirus HKU1 DETECTED (A) NOT DETECTED Final   Coronavirus NL63 NOT DETECTED NOT DETECTED Final   Coronavirus OC43 NOT DETECTED NOT DETECTED Final   Metapneumovirus NOT DETECTED NOT DETECTED Final   Rhinovirus / Enterovirus NOT DETECTED NOT DETECTED Final   Influenza A NOT DETECTED NOT DETECTED Final   Influenza B NOT DETECTED NOT DETECTED Final   Parainfluenza Virus 1 NOT DETECTED NOT DETECTED Final   Parainfluenza Virus 2 NOT DETECTED NOT DETECTED Final   Parainfluenza Virus 3 NOT DETECTED NOT DETECTED Final   Parainfluenza Virus 4 NOT DETECTED NOT DETECTED Final   Respiratory Syncytial Virus NOT DETECTED NOT DETECTED Final   Bordetella pertussis NOT DETECTED NOT DETECTED Final   Chlamydophila pneumoniae NOT DETECTED NOT DETECTED Final   Mycoplasma pneumoniae NOT DETECTED NOT  DETECTED Final  Culture, respiratory (NON-Expectorated)     Status: None (Preliminary result)   Collection Time: 09/26/16  5:09 PM  Result Value Ref Range Status   Specimen Description BRONCHIAL ALVEOLAR LAVAGE  Final   Special Requests RIGHT  Final   Gram Stain   Final    ABUNDANT WBC PRESENT, PREDOMINANTLY PMN FEW SQUAMOUS EPITHELIAL CELLS PRESENT ABUNDANT GRAM POSITIVE COCCI IN PAIRS IN CLUSTERS ABUNDANT GRAM NEGATIVE RODS ABUNDANT GRAM NEGATIVE COCCI FEW GRAM POSITIVE RODS    Culture CULTURE REINCUBATED FOR BETTER GROWTH  Final   Report Status PENDING  Incomplete  MRSA PCR Screening     Status: None   Collection Time: 09/26/16  6:18 PM  Result Value Ref Range Status   MRSA by PCR NEGATIVE NEGATIVE Final    Comment:        The GeneXpert MRSA Assay (FDA approved for NASAL specimens only), is one component of a comprehensive MRSA colonization surveillance program. It is not intended to diagnose MRSA infection nor to guide or monitor treatment for MRSA infections.     Impression/Plan:  1. Pneumonia - + coronavirus, possible bacterial superinfection though presentation favors viral.  Continuing with broad spectrum antibiotics and will narrow as cultures grow.   2.  repiratory failure - from #1, intubated.   3. ESRD - on dialysis 4.  CHF - known nicm, EF 30%.

## 2016-09-27 NOTE — Progress Notes (Signed)
Initial Nutrition Assessment  DOCUMENTATION CODES:   Not applicable  INTERVENTION:  - Will order Vital 1.5 @ 50 mL/hr with 30 mL Prostat TID. This regimen will provide 2100 kcal (93% estimated kcal need), 126 grams of protein, 2400 mg K, 480 mg Mg, 1200 mg Phos, and 917 mL free water.  - RD will follow-up 1/22.  NUTRITION DIAGNOSIS:   Inadequate oral intake related to inability to eat as evidenced by NPO status.  GOAL:   Patient will meet greater than or equal to 90% of their needs  MONITOR:   TF tolerance, Vent status, Weight trends, Labs, I & O's  REASON FOR ASSESSMENT:   Ventilator, Consult Enteral/tube feeding initiation and management  ASSESSMENT:   66 y.o. male with medical history significant for ESRD on T Th S ([name]), Afib, CHF, brought today after waking up aroung 930 with initial confusion (GCS 14), disoriented. Last known normal 4 am. He was noted to have developed productive phlegm over the last 3 days, shortness of breath, intermittent fever up top 103, and generalized myalgia. He denies any chest pain or palpitations but he does endorse tachycardia.  Pt seen for new vent and consult. BMI indicates overweight status. Pt is on dialysis T, Th, and S as outlined above. No family/visitors present. Spoke with RN at bedside who reports persistent hypoglycemia despite D5 @ 50 mL/hr x1 hour. OGT in place.   Physical assessment shows no muscle and no fat wasting, mild edema to BLE. Per chart review, weight -5 kg since 09/19/16 and -2.3 kg since 09/30/2016. Will monitor weight trends closely given ESRD. Will not order Nepro at this time with K WDL, no Mg or Phos available to view, and persistent hypoglycemia; will adjust if needed on Monday.  Patient is currently intubated on ventilator support MV: 14 L/min Temp (24hrs), Avg:99.3 F (37.4 C), Min:98.2 F (36.8 C), Max:100.7 F (38.2 C) Propofol: none BP: 94/73 and MAP: 81   Medications reviewed; PRN Dulcolax, 20  mg IV Ppecid/day, PRN Senokot, 800 mg Renvela per OGT TID. Labs reviewed; CBGs: 43-128 mg/dL today, Cl: 98 mmol/L, BUN: 38 mg/dL, creatinine: 3.29 mg/dL, GFR: 12 mL/min. K WDL and no Mg or Phos available at this time.   IVF: D5-NS @ 50 mL/hr (204 kcal).  Drips: Neo @ 100 mcg/min, Fentanyl @ 250 mcg/h.   Diet Order:    NPO  Skin:  Reviewed, no issues  Last BM:  1/19  Height:   Ht Readings from Last 1 Encounters:  09/15/2016 6\' 1"  (1.854 m)    Weight:   Wt Readings from Last 1 Encounters:  09/26/16 192 lb 14.4 oz (87.5 kg)    Ideal Body Weight:  83.63 kg  BMI:  Body mass index is 25.45 kg/m.  Estimated Nutritional Needs:   Kcal:  2260  Protein:  105-131 grams (1.2-1.5 grams/kg)  Fluid:  1.2-1.5 L/day EDUCATION NEEDS:   No education needs identified at this time    Trenton Gammon, MS, RD, LDN, CNSC Inpatient Clinical Dietitian Pager # [phone] After hours/weekend pager # [phone]

## 2016-09-28 ENCOUNTER — Inpatient Hospital Stay (HOSPITAL_COMMUNITY): Payer: Medicare Other

## 2016-09-28 DIAGNOSIS — J80 Acute respiratory distress syndrome: Secondary | ICD-10-CM

## 2016-09-28 LAB — RENAL FUNCTION PANEL
ANION GAP: 11 (ref 5–15)
Albumin: 1.8 g/dL — ABNORMAL LOW (ref 3.5–5.0)
BUN: 57 mg/dL — ABNORMAL HIGH (ref 6–20)
CHLORIDE: 103 mmol/L (ref 101–111)
CO2: 23 mmol/L (ref 22–32)
Calcium: 8.6 mg/dL — ABNORMAL LOW (ref 8.9–10.3)
Creatinine, Ser: 6.1 mg/dL — ABNORMAL HIGH (ref 0.61–1.24)
GFR calc Af Amer: 10 mL/min — ABNORMAL LOW (ref >60)
GFR calc non Af Amer: 9 mL/min — ABNORMAL LOW (ref >60)
GLUCOSE: 111 mg/dL — AB (ref 65–99)
Phosphorus: 5.5 mg/dL — ABNORMAL HIGH (ref 2.5–4.6)
Potassium: 4 mmol/L (ref 3.5–5.1)
SODIUM: 137 mmol/L (ref 135–145)

## 2016-09-28 LAB — CK TOTAL AND CKMB (NOT AT ARMC)
CK, MB: 7.9 ng/mL — ABNORMAL HIGH (ref 0.5–5.0)
RELATIVE INDEX: 5.3 — AB (ref 0.0–2.5)
Total CK: 148 U/L (ref 49–397)

## 2016-09-28 LAB — GLUCOSE, CAPILLARY
GLUCOSE-CAPILLARY: 101 mg/dL — AB (ref 65–99)
GLUCOSE-CAPILLARY: 98 mg/dL (ref 65–99)
Glucose-Capillary: 104 mg/dL — ABNORMAL HIGH (ref 65–99)
Glucose-Capillary: 108 mg/dL — ABNORMAL HIGH (ref 65–99)
Glucose-Capillary: 119 mg/dL — ABNORMAL HIGH (ref 65–99)

## 2016-09-28 LAB — CBC
HEMATOCRIT: 38.5 % — AB (ref 39.0–52.0)
Hemoglobin: 13.4 g/dL (ref 13.0–17.0)
MCH: 27.2 pg (ref 26.0–34.0)
MCHC: 34.8 g/dL (ref 30.0–36.0)
MCV: 78.1 fL (ref 78.0–100.0)
Platelets: 127 10*3/uL — ABNORMAL LOW (ref 150–400)
RBC: 4.93 MIL/uL (ref 4.22–5.81)
RDW: 17.8 % — AB (ref 11.5–15.5)
WBC: 13.3 10*3/uL — ABNORMAL HIGH (ref 4.0–10.5)

## 2016-09-28 LAB — PHOSPHORUS
Phosphorus: 5.5 mg/dL — ABNORMAL HIGH (ref 2.5–4.6)
Phosphorus: 5.3 mg/dL — ABNORMAL HIGH (ref 2.5–4.6)

## 2016-09-28 LAB — BASIC METABOLIC PANEL
Anion gap: 13 (ref 5–15)
BUN: 63 mg/dL — AB (ref 6–20)
CHLORIDE: 103 mmol/L (ref 101–111)
CO2: 21 mmol/L — ABNORMAL LOW (ref 22–32)
Calcium: 8.6 mg/dL — ABNORMAL LOW (ref 8.9–10.3)
Creatinine, Ser: 6.92 mg/dL — ABNORMAL HIGH (ref 0.61–1.24)
GFR calc Af Amer: 9 mL/min — ABNORMAL LOW (ref >60)
GFR calc non Af Amer: 8 mL/min — ABNORMAL LOW (ref >60)
GLUCOSE: 98 mg/dL (ref 65–99)
POTASSIUM: 4 mmol/L (ref 3.5–5.1)
Sodium: 137 mmol/L (ref 135–145)

## 2016-09-28 LAB — MAGNESIUM
MAGNESIUM: 2.4 mg/dL (ref 1.7–2.4)
MAGNESIUM: 2.6 mg/dL — AB (ref 1.7–2.4)

## 2016-09-28 LAB — CULTURE, RESPIRATORY W GRAM STAIN

## 2016-09-28 LAB — PROCALCITONIN: Procalcitonin: 28.96 ng/mL

## 2016-09-28 LAB — CULTURE, RESPIRATORY

## 2016-09-28 LAB — TROPONIN I
TROPONIN I: 0.14 ng/mL — AB (ref <0.03)
Troponin I: 0.17 ng/mL (ref <0.03)

## 2016-09-28 LAB — HIV ANTIBODY (ROUTINE TESTING W REFLEX): HIV Screen 4th Generation wRfx: NONREACTIVE

## 2016-09-28 MED ORDER — PRISMASOL BGK 4/2.5 32-4-2.5 MEQ/L IV SOLN
INTRAVENOUS | Status: DC
  Administered 2016-09-28 – 2016-09-30 (×18): via INTRAVENOUS_CENTRAL
  Filled 2016-09-28 (×23): qty 5000

## 2016-09-28 MED ORDER — LEVOFLOXACIN IN D5W 250 MG/50ML IV SOLN
250.0000 mg | INTRAVENOUS | Status: DC
  Filled 2016-09-28: qty 50

## 2016-09-28 MED ORDER — PRISMASOL BGK 4/2.5 32-4-2.5 MEQ/L IV SOLN
INTRAVENOUS | Status: DC
  Administered 2016-09-28 – 2016-09-30 (×3): via INTRAVENOUS_CENTRAL
  Filled 2016-09-28 (×4): qty 5000

## 2016-09-28 MED ORDER — PRISMASOL BGK 4/2.5 32-4-2.5 MEQ/L IV SOLN
INTRAVENOUS | Status: DC
  Administered 2016-09-28 – 2016-09-29 (×2): via INTRAVENOUS_CENTRAL
  Filled 2016-09-28 (×6): qty 5000

## 2016-09-28 MED ORDER — HEPARIN SODIUM (PORCINE) 1000 UNIT/ML DIALYSIS
1000.0000 [IU] | INTRAMUSCULAR | Status: DC | PRN
  Administered 2016-09-28: 1200 [IU] via INTRAVENOUS_CENTRAL
  Filled 2016-09-28 (×2): qty 6

## 2016-09-28 MED ORDER — SODIUM CHLORIDE 0.9 % FOR CRRT
INTRAVENOUS_CENTRAL | Status: DC | PRN
  Filled 2016-09-28: qty 1000

## 2016-09-28 MED ORDER — PIPERACILLIN-TAZOBACTAM 3.375 G IVPB 30 MIN
3.3750 g | Freq: Four times a day (QID) | INTRAVENOUS | Status: DC
  Administered 2016-09-28 – 2016-09-30 (×8): 3.375 g via INTRAVENOUS
  Filled 2016-09-28 (×10): qty 50

## 2016-09-28 MED ORDER — DIGOXIN 0.0625 MG HALF TABLET
0.0625 mg | ORAL_TABLET | ORAL | Status: DC
  Filled 2016-09-28: qty 1

## 2016-09-28 MED ORDER — VANCOMYCIN HCL IN DEXTROSE 1-5 GM/200ML-% IV SOLN
1000.0000 mg | INTRAVENOUS | Status: DC
  Filled 2016-09-28: qty 200

## 2016-09-28 MED ORDER — FAMOTIDINE 40 MG/5ML PO SUSR
20.0000 mg | Freq: Every day | ORAL | Status: DC
  Administered 2016-09-28 – 2016-10-04 (×7): 20 mg via ORAL
  Filled 2016-09-28 (×8): qty 2.5

## 2016-09-28 NOTE — Procedures (Signed)
Hemodialysis Catheter Insertion Procedure Note Trevor [name] [id number] 1951-08-16  Procedure: Insertion of Central Venous Catheter Indications: CVVHD  Procedure Details Consent: Risks of procedure as well as the alternatives and risks of each were explained to the (patient/caregiver).  Consent for procedure obtained. Time Out: Verified patient identification, verified procedure, site/side was marked, verified correct patient position, special equipment/implants available, medications/allergies/relevent history reviewed, required imaging and test results available.  Performed  Maximum sterile technique was used including antiseptics, cap, gloves, gown, hand hygiene, mask and sheet. Skin prep: Chlorhexidine; local anesthetic administered A antimicrobial bonded/coated triple lumen HD catheter was placed in the left internal jugular vein using the Seldinger technique.  Evaluation Blood flow good Complications: No apparent complications Patient did tolerate procedure well. Chest X-ray ordered to verify placement.  CXR: pending.  Placed by Dr. Rich Number (resident) under my direct supervision   Performed using ultrasound guidance.  Wire visualized in vessel under ultrasound.   Dirk Dress, NP 09/28/2016  12:00 PM

## 2016-09-28 NOTE — Progress Notes (Signed)
PULMONARY / CRITICAL CARE MEDICINE   Name: Trevor [name] MRN: [id number] DOB: 14-Oct-1950    ADMISSION DATE:  09/16/2016 CONSULTATION DATE:  1/19  REFERRING MD:  Sloan Leiter  CHIEF COMPLAINT:   Progressive acute hypoxic respiratory failure, SIRS/sepsis AND worsening Encephalopathy   brief 66 year old male w/ sig h/o systolic CM (EF 32-67%), AF, (not on AC)  ESRD TTS, and cardiac related cirrhosis. Admitted 1/17 w/ 3 d h/o URI symptoms, temp as high as 103, worsening malaise and progressive dyspnea. The day of admit his wife found him to be disoriented. On arrival to the ER his room air sats were in 80s. His MS and sats improved w/ oxygen. He was admitted w/ working dx of HCAP. Placed on broad spec abx and admitted to the SDU. From 1/17 to 1/19 his confusion actually continued to worsen. His RVP PCR was positive for Coronavirus. He underwent HD X 2 (From time of admit to 1/19). When he returned to the SDU on 1/19 after HD (net -~2.5 L) he was found to be more confused, post HS CXR w/ progression of bilateral R>L infiltrates and O2 requirements up to 5 liters w/ RR in 30s to 40s. PCCM was asked to see given concern that his clinical course had been deteriorating.   STUDIES:  09/23/2016 - admit ECHO 1/19>>>ef 45% RVP PCR 1/18: Coronavirus HKUI BCX2 1/17>>> BAL 1/19 >>> Peritoneal 1/19>>>   SUBJECTIVE/OVERNIGHT/INTERVAL HX Remains on Neo. Not following commands. Responds to painful stimuli.   VITAL SIGNS: BP (!) 89/64   Pulse 75   Temp 97.9 F (36.6 C) (Oral)   Resp (!) 28   Ht _0  (1.854 m)   Wt 195 lb 15.8 oz (88.9 kg)   SpO2 95%   BMI 25.86 kg/m  5 liters  HEMODYNAMICS: CVP:  [13 mmHg-19 mmHg] 14 mmHg  VENTILATOR SETTINGS: Vent Mode: PRVC FiO2 (%):  [40 %] 40 % Set Rate:  [28 bmp] 28 bmp Vt Set:  [480 mL] 480 mL PEEP:  [8 cmH20] 8 cmH20 Plateau Pressure:  [10 cmH20-22 cmH20] 21 cmH20  INTAKE / OUTPUT:  Intake/Output Summary (Last 24 hours) at 09/28/16 0655 Last  data filed at 09/28/16 0600  Gross per 24 hour  Intake             4257 ml  Output                0 ml  Net             4257 ml     General appearance: Adult man, intubated HEENT: [place]/AT, PERRL, sclera anicteric, mucus membranes moist Neck: Trachea midline; neck supple, no JVD Lungs/chest: Coarse breath sounds bilaterally, sync with vent CV: irregularly irregular w/ 3/6 holosystolic murmur. LUE AVG w/ good bruit and thrill  Abdomen: distended and firm but non-tender; no masses  Extremities: No peripheral edema or extremity lymphadenopathy Skin: warm temperature, turgor and texture nml; old healed papular rash on back, no ulcers or subcutaneous nodules Neuro: rass -3 on sedation  gtt  LABS: PULMONARY  Recent Labs Lab 09/19/2016 1201 09/26/16 1753 09/27/16 0342  PHART  --  7.475* 7.431  PCO2ART  --  40.6 39.9  PO2ART  --  312.0* 84.0  HCO3 27.2 29.9* 26.6  TCO2 _1 O2SAT 87.0 100.0 97.0    CBC  Recent Labs Lab 09/26/16 0355 09/26/16 0745 09/27/16 0259  HGB 11.8* 12.3* 12.2*  HCT 34.7* 36.1* 36.5*  WBC 11.2* 11.6*  9.5  PLT 72* 76* 83*    COAGULATION  Recent Labs Lab 09/30/2016 1815  INR 1.53    CARDIAC    Recent Labs Lab 09/27/16 0259 09/27/16 0959 09/27/16 1700 09/27/16 2311 09/28/16 0359  TROPONINI 0.14* 0.17* 0.26* 0.11* 0.17*   No results for input(s): PROBNP in the last 168 hours.   CHEMISTRY  Recent Labs Lab 09/25/16 0407 09/25/16 2128 09/26/16 0355 09/26/16 0740 09/27/16 0259 09/27/16 1426 09/27/16 1700 09/28/16 0409  NA 138 134* 135 135 137  --   --   --   K 5.4* 4.5 4.2 4.1 3.8  --   --   --   CL 100* 96* 96* 97* 98*  --   --   --   CO2 _0 --   --   --   GLUCOSE 91 111* 108* 92 67  --   --   --   BUN 84* 45* 51* 54* 38*  --   --   --   CREATININE 9.22* 6.11* 6.46* 6.79* 5.17*  --   --   --   CALCIUM 9.2 8.9 9.0 9.3 9.3  --   --   --   MG  --   --   --   --   --  2.2 2.2 2.4  PHOS  --   --  4.0 4.1  --  4.6  5.0* 5.5*   Estimated Creatinine Clearance: 16.5 mL/min (by C-G formula based on SCr of 5.17 mg/dL (H)).   LIVER  Recent Labs Lab 09/25/2016 1120 09/26/2016 1815 09/25/16 0407 09/25/16 2128 09/26/16 0355 09/26/16 0740 09/27/16 0259  AST 74* 67* 62* 53*  --   --  41  ALT _1 --   --  22  ALKPHOS 141* 132* 126 113  --   --  102  BILITOT 6.0* 6.1* 6.8* 6.7*  --   --  6.8*  PROT 7.8 7.2 7.4 7.1  --   --  7.1  ALBUMIN 2.5* 2.3* 2.3* 2.3* 2.2* 2.2* 2.2*  INR  --  1.53  --   --   --   --   --      INFECTIOUS  Recent Labs Lab 09/25/16 0708 09/26/16 1237 09/26/16 1629 09/26/16 1822 09/27/16 0259 09/28/16 0359  LATICACIDVEN 2.3* 2.7* 2.1*  --   --   --   PROCALCITON  --   --   --  37.98 35.92 28.96     ENDOCRINE CBG (last 3)   Recent Labs  09/27/16 2021 09/27/16 2313 09/28/16 0415  GLUCAP 93 96 104*         IMAGING x48h  - image(s) personally visualized  -   highlighted in bold Dg Chest Port 1 View  Result Date: 09/26/2016 CLINICAL DATA:  Central line placement EXAM: PORTABLE CHEST 1 VIEW COMPARISON:  09/26/2016 1200 hours FINDINGS: Endotracheal tube placed. The tip is 2.7 cm from the carina. NG tube tip is beyond the gastroesophageal junction. Right jugular central venous catheter. Tip is at the upper SVC. No pneumothorax. Diffuse bilateral airspace disease is stable. Low volumes. Cardiomegaly. Right paratracheal opacity is nonspecific. IMPRESSION: Endotracheal and NG tubes. Right jugular venous catheter tip in the upper SVC and no pneumothorax. Stable diffuse bilateral airspace disease. Electronically Signed   By: Marybelle Killings M.D.   On: 09/26/2016 17:03   Dg Chest Port 1 View  Result Date: 09/26/2016 EXAM: PORTABLE CHEST 1 VIEW COMPARISON:  09/25/2016 FINDINGS: There has been slight progression of bilateral pulmonary edema. Chronic cardiomegaly. No visible effusions.  No acute bone abnormality. IMPRESSION: Slight progression of bilateral pulmonary  infiltrates, most consistent with pulmonary edema. Electronically Signed   By: Lorriane Shire M.D.   On: 09/26/2016 13:00   Dg Chest Port 1v Same Day  Result Date: 09/27/2016 CLINICAL DATA:  Central line placement EXAM: PORTABLE CHEST 1 VIEW COMPARISON:  09/26/2016 FINDINGS: Cardiomediastinal silhouette is stable. Bilateral perihilar and infrahilar air space opacities and interstitial prominence again noted. Findings may be due to bilateral pneumonia or bilateral pulmonary edema. Endotracheal tube in place with tip 3.3 cm above the carina. NG tube in place. Right IJ central line is stable in position with tip in upper SVC. No pneumothorax. IMPRESSION: Bilateral perihilar and infrahilar air space opacities and interstitial prominence again noted. Findings may be due to bilateral pneumonia or bilateral pulmonary edema. Endotracheal tube in place with tip 3.3 cm above the carina. NG tube in place. Right IJ central line is stable in position with tip in upper SVC. No pneumothorax. Electronically Signed   By: Lahoma Crocker M.D.   On: 09/27/2016 17:54        ANTIBIOTICS: vanc 1/17>> Zosyn 1/17>>> levaquin 1/19>>>  SIGNIFICANT EVENTS:   LINES/TUBES: ETT 1/19>> RIJ CVL 1/19>>  DISCUSSION: 86 yom w/ advanced systolic CM (EF 37-04%), ESRD, and AF. Admitted 1/17 w/ viral PNA +/- HCAP. Now w/ progressive hypoxia, pulmonary infiltrates and respiratory failure. Looks like evolving ALI w/ persistent SIRS response. Placed on ARDS protocol.   ASSESSMENT / PLAN:  PULMONARY A: Acute hypoxic Respiratory failure in the setting of progressive and bilateral pulmonary infiltrates.  Presume viral Pneumonitis +/- HCAP  W/ progressive ARDS   P:   ARDS protocol Wean as able PAD protocol  Abx as above F/u BAL CXR in AM  CARDIOVASCULAR A:  Af w/ RVR SIRS/sepsis  Troponin elevation, likely demand  Chronic systolic HF w/ EF 88-89%  P:  Change Digoxin to Q48 hours with ESRD, consider d/c'ing Tele MAP  >60 On Neo, wean as tolerated Volume/pressors as indicated  RENAL A:   ESRD-TTS P:   Renal following F/u chemistry   GASTROINTESTINAL A:   Hepatic congestion/ w/ hepatonephromegaly w/ presumed cardiac related Cirrhosis (has had prior paracentesis on 1/12) Ascites s/p para on 1/12- no SBP Nutrition P:   TFs  Pepcid  HEMATOLOGIC A:   Thrombocytopenia- at baseline  Remote DVT P:  Trend CBC Transfuse per protocol   INFECTIOUS A:   Viral PNA + Coronavirus HKU1 HCAP Sirs/sepsis  P:   F/u BAL F/u HIV Cont current abx as above   ENDOCRINE A:   Hypoglycemia  P:   Cont D5  Cont cbgs  Avoid SSI unless consistently > 200  NEUROLOGIC A:   Acute encephalopathy in setting of sepsis and cirrhosis  P:   RASS goal: -1 to -2 PAD protocol   FAMILY  - Updates: son at bedside updated 09/28/2016   - Inter-disciplinary family meet or Palliative Care meeting due by:  1/26   Albin Felling, MD, MPH Internal Medicine Resident, PGY-III Pager: [phone]  09/28/2016 6:55 AM

## 2016-09-28 NOTE — Progress Notes (Signed)
[facility] ROUNDING NOTE   Subjective:   Interval History:  Hypotensive -- Pressors --- Ventilator   Progressive acute hypoxic respiratory failure, SIRS/sepsis AND worsening Encephalopathy   66 year old male w/ sig h/o systolic CM (EF 99-83%), AF, (not on AC)  ESRD TTS, and cardiac related cirrhosis. Admitted 1/17 w/ 3 d h/o URI symptoms, temp as high as 103, worsening malaise and progressive dyspnea. The day of admit his wife found him to be disoriented. On arrival to the ER his room air sats were in 80s. His MS and sats improved w/ oxygen. He was admitted w/ working dx of HCAP. Placed on broad spec abx and admitted to the SDU. From 1/17 to 1/19 his confusion actually continued to worsen. RVP PCR 1/18: Coronavirus HKUI. He underwent HD X 2 (From time of admit to 1/19). When he returned to the SDU on 1/19 after HD (net -~2.5 L) he was found to be more confused, post HS CXR w/ progression of bilateral R>L infiltrates and O2 requirements up to 5 liters w/ RR in 30s to 40s. PCCM was asked to see given concern that his clinical course had been deteriorating.   Objective:  Vital signs in last 24 hours:  Temp:  [97.9 F (36.6 C)-99.8 F (37.7 C)] 98.8 F (37.1 C) (01/21 0828) Pulse Rate:  [26-112] 81 (01/21 0930) Resp:  [12-29] 28 (01/21 0930) BP: (46-108)/(32-86) 93/61 (01/21 0930) SpO2:  [89 %-100 %] 95 % (01/21 0930) FiO2 (%):  [40 %] 40 % (01/21 0853) Weight:  [88.9 kg (195 lb 15.8 oz)] 88.9 kg (195 lb 15.8 oz) (01/21 0336)  Weight change: -1.1 kg (-2 lb 6.8 oz) Filed Weights   09/26/16 0728 09/26/16 1128 09/28/16 0336  Weight: 90 kg (198 lb 6.6 oz) 87.5 kg (192 lb 14.4 oz) 88.9 kg (195 lb 15.8 oz)    Intake/Output: I/O last 3 completed shifts: In: 5424.5 [I.V.:4280.3; NG/GT:994.2; IV Piggyback:150] Out: -    Intake/Output this shift:  Total I/O In: 458.9 [I.V.:308.9; NG/GT:100; IV Piggyback:50] Out: -   CVS-  Irregular irregular  No JVD  RS- CTA  - coarse  breath sounds synchronous with mechanical ventilation ABD- BS present soft non-distended  Hypoactive  EXT- no peripheral edema    Basic Metabolic Panel:  Recent Labs Lab 09/25/16 0407 09/25/16 2128 09/26/16 0355 09/26/16 0740 09/27/16 0259 09/27/16 1426 09/27/16 1700 09/28/16 0409  NA 138 134* 135 135 137  --   --   --   K 5.4* 4.5 4.2 4.1 3.8  --   --   --   CL 100* 96* 96* 97* 98*  --   --   --   CO2 _0 --   --   --   GLUCOSE 91 111* 108* 92 67  --   --   --   BUN 84* 45* 51* 54* 38*  --   --   --   CREATININE 9.22* 6.11* 6.46* 6.79* 5.17*  --   --   --   CALCIUM 9.2 8.9 9.0 9.3 9.3  --   --   --   MG  --   --   --   --   --  2.2 2.2 2.4  PHOS  --   --  4.0 4.1  --  4.6 5.0* 5.5*    Liver Function Tests:  Recent Labs Lab 09/27/2016 1120 10/06/2016 1815 09/25/16 0407 09/25/16 2128 09/26/16 0355 09/26/16 0740 09/27/16 0259  AST 74* 67* 62* 53*  --   --  41  ALT _0 --   --  22  ALKPHOS 141* 132* 126 113  --   --  102  BILITOT 6.0* 6.1* 6.8* 6.7*  --   --  6.8*  PROT 7.8 7.2 7.4 7.1  --   --  7.1  ALBUMIN 2.5* 2.3* 2.3* 2.3* 2.2* 2.2* 2.2*   No results for input(s): LIPASE, AMYLASE in the last 168 hours.  Recent Labs Lab 09/25/16 2128  AMMONIA 47*    CBC:  Recent Labs Lab 09/27/2016 1120 10/06/2016 1815 09/25/16 0407 09/25/16 2128 09/26/16 0355 09/26/16 0745 09/27/16 0259  WBC 12.0* 10.1 12.3* 11.6* 11.2* 11.6* 9.5  NEUTROABS 11.1* 9.1*  --  10.6*  --   --   --   HGB 12.5* 12.3* 11.7* 11.7* 11.8* 12.3* 12.2*  HCT 36.7* 35.6* 34.3* 34.4* 34.7* 36.1* 36.5*  MCV 79.3 78.8 79.2 78.7 78.9 79.2 79.5  PLT 83* 85* 83* 81* 72* 76* 83*    Cardiac Enzymes:  Recent Labs Lab 09/27/16 0259 09/27/16 0959 09/27/16 1700 09/27/16 2311 09/28/16 0359  CKTOTAL  --   --   --   --  148  CKMB  --   --   --   --  7.9*  TROPONINI 0.14* 0.17* 0.26* 0.11* 0.17*    BNP: Invalid input(s): POCBNP  CBG:  Recent Labs Lab 09/27/16 1830  09/27/16 2021 09/27/16 2313 09/28/16 0415 09/28/16 0828  GLUCAP 77 93 96 104* 108*    Microbiology: Results for orders placed or performed during the hospital encounter of 09/10/2016  Blood Culture (routine x 2)     Status: None (Preliminary result)   Collection Time: 09/21/2016 11:10 AM  Result Value Ref Range Status   Specimen Description BLOOD RIGHT ARM  Final   Special Requests BOTTLES DRAWN AEROBIC AND ANAEROBIC 5CC  Final   Culture NO GROWTH 3 DAYS  Final   Report Status PENDING  Incomplete  Blood Culture (routine x 2)     Status: None (Preliminary result)   Collection Time: 09/22/2016 11:20 AM  Result Value Ref Range Status   Specimen Description BLOOD RIGHT ANTECUBITAL  Final   Special Requests BOTTLES DRAWN AEROBIC AND ANAEROBIC 5CC  Final   Culture NO GROWTH 3 DAYS  Final   Report Status PENDING  Incomplete  Respiratory Panel by PCR     Status: Abnormal   Collection Time: 09/25/16  3:19 PM  Result Value Ref Range Status   Adenovirus NOT DETECTED NOT DETECTED Final   Coronavirus 229E NOT DETECTED NOT DETECTED Final   Coronavirus HKU1 DETECTED (A) NOT DETECTED Final   Coronavirus NL63 NOT DETECTED NOT DETECTED Final   Coronavirus OC43 NOT DETECTED NOT DETECTED Final   Metapneumovirus NOT DETECTED NOT DETECTED Final   Rhinovirus / Enterovirus NOT DETECTED NOT DETECTED Final   Influenza A NOT DETECTED NOT DETECTED Final   Influenza B NOT DETECTED NOT DETECTED Final   Parainfluenza Virus 1 NOT DETECTED NOT DETECTED Final   Parainfluenza Virus 2 NOT DETECTED NOT DETECTED Final   Parainfluenza Virus 3 NOT DETECTED NOT DETECTED Final   Parainfluenza Virus 4 NOT DETECTED NOT DETECTED Final   Respiratory Syncytial Virus NOT DETECTED NOT DETECTED Final   Bordetella pertussis NOT DETECTED NOT DETECTED Final   Chlamydophila pneumoniae NOT DETECTED NOT DETECTED Final   Mycoplasma pneumoniae NOT DETECTED NOT DETECTED Final  Culture, respiratory (NON-Expectorated)  Status: None    Collection Time: 09/26/16  5:09 PM  Result Value Ref Range Status   Specimen Description BRONCHIAL ALVEOLAR LAVAGE  Final   Special Requests RIGHT  Final   Gram Stain   Final    ABUNDANT WBC PRESENT, PREDOMINANTLY PMN FEW SQUAMOUS EPITHELIAL CELLS PRESENT ABUNDANT GRAM POSITIVE COCCI IN PAIRS IN CLUSTERS ABUNDANT GRAM NEGATIVE RODS ABUNDANT GRAM NEGATIVE COCCI FEW GRAM POSITIVE RODS    Culture MULTIPLE ORGANISMS PRESENT, NONE PREDOMINANT  Final   Report Status 09/28/2016 FINAL  Final  MRSA PCR Screening     Status: None   Collection Time: 09/26/16  6:18 PM  Result Value Ref Range Status   MRSA by PCR NEGATIVE NEGATIVE Final    Comment:        The GeneXpert MRSA Assay (FDA approved for NASAL specimens only), is one component of a comprehensive MRSA colonization surveillance program. It is not intended to diagnose MRSA infection nor to guide or monitor treatment for MRSA infections.     Coagulation Studies: No results for input(s): LABPROT, INR in the last 72 hours.  Urinalysis: No results for input(s): COLORURINE, LABSPEC, PHURINE, GLUCOSEU, HGBUR, BILIRUBINUR, KETONESUR, PROTEINUR, UROBILINOGEN, NITRITE, LEUKOCYTESUR in the last 72 hours.  Invalid input(s): APPERANCEUR    Imaging: Dg Chest Port 1 View  Result Date: 09/26/2016 CLINICAL DATA:  Central line placement EXAM: PORTABLE CHEST 1 VIEW COMPARISON:  09/26/2016 1200 hours FINDINGS: Endotracheal tube placed. The tip is 2.7 cm from the carina. NG tube tip is beyond the gastroesophageal junction. Right jugular central venous catheter. Tip is at the upper SVC. No pneumothorax. Diffuse bilateral airspace disease is stable. Low volumes. Cardiomegaly. Right paratracheal opacity is nonspecific. IMPRESSION: Endotracheal and NG tubes. Right jugular venous catheter tip in the upper SVC and no pneumothorax. Stable diffuse bilateral airspace disease. Electronically Signed   By: Marybelle Killings M.D.   On: 09/26/2016 17:03   Dg Chest  Port 1 View  Result Date: 09/26/2016 EXAM: PORTABLE CHEST 1 VIEW COMPARISON:  09/25/2016 FINDINGS: There has been slight progression of bilateral pulmonary edema. Chronic cardiomegaly. No visible effusions.  No acute bone abnormality. IMPRESSION: Slight progression of bilateral pulmonary infiltrates, most consistent with pulmonary edema. Electronically Signed   By: Lorriane Shire M.D.   On: 09/26/2016 13:00   Dg Chest Port 1v Same Day  Result Date: 09/27/2016 CLINICAL DATA:  Central line placement EXAM: PORTABLE CHEST 1 VIEW COMPARISON:  09/26/2016 FINDINGS: Cardiomediastinal silhouette is stable. Bilateral perihilar and infrahilar air space opacities and interstitial prominence again noted. Findings may be due to bilateral pneumonia or bilateral pulmonary edema. Endotracheal tube in place with tip 3.3 cm above the carina. NG tube in place. Right IJ central line is stable in position with tip in upper SVC. No pneumothorax. IMPRESSION: Bilateral perihilar and infrahilar air space opacities and interstitial prominence again noted. Findings may be due to bilateral pneumonia or bilateral pulmonary edema. Endotracheal tube in place with tip 3.3 cm above the carina. NG tube in place. Right IJ central line is stable in position with tip in upper SVC. No pneumothorax. Electronically Signed   By: Lahoma Crocker M.D.   On: 09/27/2016 17:54     Medications:   . sodium chloride Stopped (09/28/16 0700)  . dextrose 5 % and 0.9% NaCl 50 mL/hr at 09/28/16 0900  . feeding supplement (VITAL 1.5 CAL) 1,000 mL (09/28/16 0900)  . fentaNYL infusion INTRAVENOUS 300 mcg/hr (09/28/16 0900)  . phenylephrine (NEO-SYNEPHRINE) Adult infusion 180 mcg/min (09/28/16  0931)  . dialysis replacement fluid (prismasate)    . dialysis replacement fluid (prismasate)    . dialysate (PRISMASATE)    . propofol (DIPRIVAN) infusion     . chlorhexidine gluconate (MEDLINE KIT)  15 mL Mouth Rinse BID  . [START ON 09/29/2016] digoxin  0.0625 mg  Per Tube Q48H  . doxercalciferol  3 mcg Intravenous Q M,W,F-HD  . famotidine (PEPCID) IV  20 mg Intravenous Q24H  . feeding supplement (PRO-STAT SUGAR FREE 64)  30 mL Per Tube TID  . levofloxacin (LEVAQUIN) IV  250 mg Intravenous Q24H  . mouth rinse  15 mL Mouth Rinse QID  . midodrine  10 mg Per Tube TID WC  . piperacillin-tazobactam  3.375 g Intravenous Q6H  . sevelamer carbonate  800 mg Per Tube TID WC   Place/Maintain arterial line **AND** sodium chloride, albuterol, bisacodyl, fentaNYL, heparin, ipratropium-albuterol, sennosides, sodium chloride  Assessment/ Plan:   Hypoxic Respiratory Failure and bilateral infiltrates with presumed viral pneumonitis and progressive ARDS.   Chronic Systolic Heart Failure  EF 30-35 %  Hepatic congestion with ascites and no SBP on 1/12  Deteriorating condition with pressor dependency will proceed with CRRT  No anticoagulation Keeping Even 4/2.5 % prismisate bags   LOS: 4 Wyatt Thorstenson W _0 _1 :41 AM

## 2016-09-28 NOTE — Progress Notes (Signed)
Updated wife about patient's condition. She states her husband would want CRRT in this clinical setting and would want to be FULL code. She reports she and her husband have had several discussions about code status and she feels confident in knowing his wishes. I explained that we would be starting CRRT and he will remain FULL code.

## 2016-09-28 NOTE — Progress Notes (Signed)
cultrue negative since admit 09/13/2016   Currently on vanc, zosyn, levfolox  Plan  - dc vanc  - dc levoflox - continue zosyn for now but low threshold to stop   Dr. Kalman Shan, M.D., [facility] Pulmonary and Critical Care Medicine Staff Physician [facility] Pager: [phone], If no answer or between  15:00h - 7:00h: call [phone]  09/28/2016 11:34 AM

## 2016-09-29 ENCOUNTER — Inpatient Hospital Stay (HOSPITAL_COMMUNITY): Payer: Medicare Other

## 2016-09-29 DIAGNOSIS — D696 Thrombocytopenia, unspecified: Secondary | ICD-10-CM

## 2016-09-29 LAB — GLUCOSE, CAPILLARY
GLUCOSE-CAPILLARY: 101 mg/dL — AB (ref 65–99)
GLUCOSE-CAPILLARY: 105 mg/dL — AB (ref 65–99)
GLUCOSE-CAPILLARY: 112 mg/dL — AB (ref 65–99)
Glucose-Capillary: 114 mg/dL — ABNORMAL HIGH (ref 65–99)
Glucose-Capillary: 119 mg/dL — ABNORMAL HIGH (ref 65–99)
Glucose-Capillary: 104 mg/dL — ABNORMAL HIGH (ref 65–99)
Glucose-Capillary: 99 mg/dL (ref 65–99)

## 2016-09-29 LAB — RENAL FUNCTION PANEL
ALBUMIN: 1.7 g/dL — AB (ref 3.5–5.0)
ANION GAP: 9 (ref 5–15)
Albumin: 1.7 g/dL — ABNORMAL LOW (ref 3.5–5.0)
Anion gap: 6 (ref 5–15)
BUN: 44 mg/dL — ABNORMAL HIGH (ref 6–20)
BUN: 37 mg/dL — AB (ref 6–20)
CALCIUM: 8.6 mg/dL — AB (ref 8.9–10.3)
CHLORIDE: 106 mmol/L (ref 101–111)
CO2: 24 mmol/L (ref 22–32)
CO2: 26 mmol/L (ref 22–32)
CREATININE: 3.87 mg/dL — AB (ref 0.61–1.24)
Calcium: 8.4 mg/dL — ABNORMAL LOW (ref 8.9–10.3)
Chloride: 106 mmol/L (ref 101–111)
Creatinine, Ser: 4.66 mg/dL — ABNORMAL HIGH (ref 0.61–1.24)
GFR calc Af Amer: 14 mL/min — ABNORMAL LOW (ref >60)
GFR, EST AFRICAN AMERICAN: 18 mL/min — AB (ref >60)
GFR, EST NON AFRICAN AMERICAN: 12 mL/min — AB (ref >60)
GFR, EST NON AFRICAN AMERICAN: 15 mL/min — AB (ref >60)
Glucose, Bld: 124 mg/dL — ABNORMAL HIGH (ref 65–99)
Glucose, Bld: 122 mg/dL — ABNORMAL HIGH (ref 65–99)
PHOSPHORUS: 4.4 mg/dL (ref 2.5–4.6)
PHOSPHORUS: 4.1 mg/dL (ref 2.5–4.6)
POTASSIUM: 3.8 mmol/L (ref 3.5–5.1)
Potassium: 4 mmol/L (ref 3.5–5.1)
Sodium: 139 mmol/L (ref 135–145)
Sodium: 138 mmol/L (ref 135–145)

## 2016-09-29 LAB — CULTURE, BLOOD (ROUTINE X 2)
Culture: NO GROWTH
Culture: NO GROWTH

## 2016-09-29 LAB — MAGNESIUM: MAGNESIUM: 2.4 mg/dL (ref 1.7–2.4)

## 2016-09-29 MED ORDER — WHITE PETROLATUM GEL
Status: AC
  Administered 2016-09-29: 11:00:00
  Filled 2016-09-29: qty 1

## 2016-09-29 NOTE — Progress Notes (Signed)
Progress Note  Patient Name: Trevor [name] Date of Encounter: 09/29/2016  Primary Cardiologist: Dr. Radford Pax   Subjective   Critically ill. Remains intubated.   Inpatient Medications    Scheduled Meds: . chlorhexidine gluconate (MEDLINE KIT)  15 mL Mouth Rinse BID  . digoxin  0.0625 mg Per Tube Q48H  . doxercalciferol  3 mcg Intravenous Q M,W,F-HD  . famotidine  20 mg Oral Daily  . feeding supplement (PRO-STAT SUGAR FREE 64)  30 mL Per Tube TID  . mouth rinse  15 mL Mouth Rinse QID  . midodrine  10 mg Per Tube TID WC  . piperacillin-tazobactam  3.375 g Intravenous Q6H  . sevelamer carbonate  800 mg Per Tube TID WC   Continuous Infusions: . sodium chloride Stopped (09/28/16 0700)  . dextrose 5 % and 0.9% NaCl 50 mL/hr at 09/28/16 1900  . feeding supplement (VITAL 1.5 CAL) 1,000 mL (09/29/16 1042)  . fentaNYL infusion INTRAVENOUS 175 mcg/hr (09/29/16 1100)  . phenylephrine (NEO-SYNEPHRINE) Adult infusion 130 mcg/min (09/29/16 1100)  . dialysis replacement fluid (prismasate) 200 mL/hr at 09/28/16 0900  . dialysis replacement fluid (prismasate) 300 mL/hr at 09/29/16 4784  . dialysate (PRISMASATE) 2,000 mL/hr at 09/29/16 1031  . propofol (DIPRIVAN) infusion 7 mcg/kg/min (09/29/16 1100)   PRN Meds: Place/Maintain arterial line **AND** sodium chloride, albuterol, bisacodyl, fentaNYL, heparin, ipratropium-albuterol, sennosides, sodium chloride   Vital Signs    Vitals:   09/29/16 1055 09/29/16 1100 09/29/16 1115 09/29/16 1130  BP: (!) 81/66 (!) 86/66 (!) 79/61 (!) 86/66  Pulse: 82 71 69 61  Resp: (!) 28 (!) 28 (!) 28 (!) 28  Temp:      TempSrc:      SpO2: 94% 95% 96% 92%  Weight:      Height:        Intake/Output Summary (Last 24 hours) at 09/29/16 1143 Last data filed at 09/29/16 1112  Gross per 24 hour  Intake          4951.76 ml  Output             3874 ml  Net          1077.76 ml   Filed Weights   09/26/16 1128 09/28/16 0336 09/29/16 0315  Weight: 192  lb 14.4 oz (87.5 kg) 195 lb 15.8 oz (88.9 kg) 204 lb 12.9 oz (92.9 kg)    Telemetry    Atrial fibrillation w CVC - Personally Reviewed   Physical Exam   GEN: inbubated.  Neck: No JVD Cardiac: irregular, regular rate, no murmurs, rubs, or gallops.  Respiratory: Intubated Clear to auscultation bilaterally. GI: Soft, nontender, non-distended  MS: No edema; No deformity. Neuro:  intubated Psych: intubated  Labs    Chemistry Recent Labs Lab 09/25/16 0407 09/25/16 2128  09/27/16 0259 09/28/16 1359 09/28/16 1624 09/29/16 0352  NA 138 134*  < > 137 137 137 139  K 5.4* 4.5  < > 3.8 4.0 4.0 3.8  CL 100* 96*  < > 98* 103 103 106  CO2 24 24  < > 23 21* 23 24  GLUCOSE 91 111*  < > 67 98 111* 124*  BUN 84* 45*  < > 38* 63* 57* 44*  CREATININE 9.22* 6.11*  < > 5.17* 6.92* 6.10* 4.66*  CALCIUM 9.2 8.9  < > 9.3 8.6* 8.6* 8.4*  PROT 7.4 7.1  --  7.1  --   --   --   ALBUMIN 2.3* 2.3*  < >  2.2*  --  1.8* 1.7*  AST 62* 53*  --  41  --   --   --   ALT 24 23  --  22  --   --   --   ALKPHOS 126 113  --  102  --   --   --   BILITOT 6.8* 6.7*  --  6.8*  --   --   --   GFRNONAA 5* 9*  < > 11* 8* 9* 12*  GFRAA 6* 10*  < > 12* 9* 10* 14*  ANIONGAP 14 14  < > 16* '13 11 9  ' < > = values in this interval not displayed.   Hematology Recent Labs Lab 09/26/16 0745 09/27/16 0259 09/28/16 1359  WBC 11.6* 9.5 13.3*  RBC 4.56 4.59 4.93  HGB 12.3* 12.2* 13.4  HCT 36.1* 36.5* 38.5*  MCV 79.2 79.5 78.1  MCH 27.0 26.6 27.2  MCHC 34.1 33.4 34.8  RDW 17.6* 17.5* 17.8*  PLT 76* 83* 127*    Cardiac Enzymes Recent Labs Lab 09/27/16 1700 09/27/16 2311 09/28/16 0359 09/28/16 1359  TROPONINI 0.26* 0.11* 0.17* 0.14*    Recent Labs Lab 09/27/2016 1132  TROPIPOC 0.28*     BNPNo results for input(s): BNP, PROBNP in the last 168 hours.   DDimer No results for input(s): DDIMER in the last 168 hours.   Radiology    Dg Chest Port 1 View  Result Date: 09/29/2016 CLINICAL DATA:  ARDS.  History of CHF, pulmonary embolism, coronary artery disease, dialysis dependent renal failure. EXAM: PORTABLE CHEST 1 VIEW COMPARISON:  Portable chest x-ray of September 28, 2016. FINDINGS: The lungs are reasonably well expanded. There are patchy interstitial and alveolar opacities bilaterally greatest on the right. The right hemidiaphragm is less well demonstrated today due to a small pleural effusion. The cardiac silhouette remains enlarged. The central pulmonary vascularity is engorged. The endotracheal tube tip projects approximately 4 cm above the carina. The esophagogastric tube tip projects below the inferior margin of the image. The dual-lumen dialysis type catheter tip projects over the proximal SVC. A small caliber right internal jugular venous catheter tip projects at approximately the same level. IMPRESSION: CHF with pulmonary interstitial and alveolar edema greatest on the right. New small to moderate sized right pleural effusion. The support tubes are in reasonable position. Electronically Signed   By: David  Martinique M.D.   On: 09/29/2016 07:15   Dg Chest Portable 1 View  Result Date: 09/28/2016 CLINICAL DATA:  Central line placement. EXAM: PORTABLE CHEST 1 VIEW COMPARISON:  09/27/2016 FINDINGS: Endotracheal tube terminates approximately 3.5 cm above the carina. Right jugular catheter is unchanged, with tip overlying the upper SVC. A larger caliber left jugular catheter has been placed and also terminates over the upper SVC. Enteric tube terminates over the proximal stomach. Cardiac silhouette remains enlarged. Perihilar and bibasilar lung opacities on the prior study have mildly improved in the interim. No sizable pleural effusion or pneumothorax is identified. IMPRESSION: 1. Left jugular catheter terminates over the upper SVC. 2. Cardiomegaly with mild improvement of bilateral airspace opacities which may reflect decreased edema. Electronically Signed   By: Logan Bores M.D.   On: 09/28/2016 13:50     Dg Chest Port 1v Same Day  Result Date: 09/27/2016 CLINICAL DATA:  Central line placement EXAM: PORTABLE CHEST 1 VIEW COMPARISON:  09/26/2016 FINDINGS: Cardiomediastinal silhouette is stable. Bilateral perihilar and infrahilar air space opacities and interstitial prominence again noted. Findings may be due to bilateral  pneumonia or bilateral pulmonary edema. Endotracheal tube in place with tip 3.3 cm above the carina. NG tube in place. Right IJ central line is stable in position with tip in upper SVC. No pneumothorax. IMPRESSION: Bilateral perihilar and infrahilar air space opacities and interstitial prominence again noted. Findings may be due to bilateral pneumonia or bilateral pulmonary edema. Endotracheal tube in place with tip 3.3 cm above the carina. NG tube in place. Right IJ central line is stable in position with tip in upper SVC. No pneumothorax. Electronically Signed   By: Lahoma Crocker M.D.   On: 09/27/2016 17:54    Patient Profile     32 YOM with a history of HTN, ESRD on HD (T,TH,Sat), chronic A. Fib, prior history of DVT/PE, GERD, gout, combined systolic and diastolic heart failure, OSA, syncopy, hepatosplenomegaly, CAD status post remote MI in 1990, chronic abdominal pain. With previous AFib he had DCCV 08/29/13 but developed a large hematoma on his back. Has not been on anticoagulation since and has not wanted. Now permanent a fib with severe atrial enlargement. Last cath 11/2015 with minimal CAD.   EF 15-20% on cath.    The patient was admitted with acute respiratory failure with hypoxia (URI) and fluid overload managed by HD. Also paracentesis on 09/19/16. He deteriorated, worsening acute on chronic respiratory failure with increased lactic acid, and intubated yesterday.  PCCM suspicious for pneumonitis and sepsis.    Assessment & Plan     1. Chronic Atrial Fibrillation:  Rate is controlled in the 60s w/ digoxin. CHA2DS2VASc score = 2. Pt refuses anticoagulation, per records.  2.  Elevated Troponin:  Troponins most likely elevated due to renal failure and sepsis and demand ischemia.  No further CV workup planned  3. CAD: non obstructive CAD by cath March 2017. EF improved from previous, now up to 40-45%.  4. HCAP: treated by IM on ABX - now on vent  5. Hypotension: in the setting of sepsis. Management per primary team.  6. NICM: He has had low EF since 2015. EF 40-45% on echo this admit, actually improved from previous-  30-35% in 2017. Cath in 2017 showed no obstructive CAD.  7. ESRD: on HD followed by Renal.    Signed, Lyda Jester, PA-C  09/29/2016, 11:43 AM    Candee Furbish, MD  Quite ill. AFIB is well controlled. Dig renally dosed.  No new recs.   Candee Furbish, MD

## 2016-09-29 NOTE — Progress Notes (Signed)
[facility] Progress Note   Subjective: remains on pressors, vent and sedated; I/O + 1.8 L yesterday  Vitals:   09/29/16 1330 09/29/16 1345 09/29/16 1400 09/29/16 1415  BP: 100/82 (!) 82/61 (!) 89/60 (!) 79/38  Pulse: 68 (!) 44 90 78  Resp: (!) 28 (!) 28 (!) 28 (!) 28  Temp:      TempSrc:      SpO2: 93% 95% 92% 93%  Weight:      Height:        Inpatient medications: . chlorhexidine gluconate (MEDLINE KIT)  15 mL Mouth Rinse BID  . digoxin  0.0625 mg Per Tube Q48H  . doxercalciferol  3 mcg Intravenous Q M,W,F-HD  . famotidine  20 mg Oral Daily  . feeding supplement (PRO-STAT SUGAR FREE 64)  30 mL Per Tube TID  . mouth rinse  15 mL Mouth Rinse QID  . midodrine  10 mg Per Tube TID WC  . piperacillin-tazobactam  3.375 g Intravenous Q6H  . sevelamer carbonate  800 mg Per Tube TID WC   . sodium chloride Stopped (09/28/16 0700)  . dextrose 5 % and 0.9% NaCl 50 mL/hr at 09/29/16 1225  . feeding supplement (VITAL 1.5 CAL) 1,000 mL (09/29/16 1042)  . fentaNYL infusion INTRAVENOUS 175 mcg/hr (09/29/16 1400)  . phenylephrine (NEO-SYNEPHRINE) Adult infusion 160 mcg/min (09/29/16 1400)  . dialysis replacement fluid (prismasate) 200 mL/hr at 09/29/16 1409  . dialysis replacement fluid (prismasate) 300 mL/hr at 09/29/16 4128  . dialysate (PRISMASATE) 2,000 mL/hr at 09/29/16 1300  . propofol (DIPRIVAN) infusion 7 mcg/kg/min (09/29/16 1400)   Place/Maintain arterial line **AND** sodium chloride, albuterol, bisacodyl, fentaNYL, heparin, ipratropium-albuterol, sennosides, sodium chloride  Exam: On vent, sedated, on CRRT No jvd Chest clear post Cor irreg irreg Abd soft ntnd Ext no sig LE edema +UE edema   HD: [facility]  MWF 3h 27mn   2/2 bath  89.5kg   Hep 2000   LUA AVF  - Hect 4  - M-225 last 1/11  - pth 942  29% TFS  P 7      Assessment: 1. Acute resp failure/ pulm infiltrates - viral PNA w Coronavirus HKU1 2. 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 Shock - acute/ chron  hypotension 4. Cirrhosis 5. VDRF 6. Volume - is up 3-4 kg by wts today 7. CAF/ NICM / ^Cleora Fleet- per cards  Plan - try to pull some fluid as tolerated per CCM request.    RKelly SplinterMD COlive Hillpager [phone]  09/29/2016, 3:00 PM    Recent Labs Lab 09/28/16 0409 09/28/16 1359 09/28/16 1624 09/29/16 0352  NA  --  137 137 139  K  --  4.0 4.0 3.8  CL  --  103 103 106  CO2  --  21* 23 24  GLUCOSE  --  98 111* 124*  BUN  --  63* 57* 44*  CREATININE  --  6.92* 6.10* 4.66*  CALCIUM  --  8.6* 8.6* 8.4*  PHOS 5.5*  --  5.3*  5.5* 4.4    Recent Labs Lab 09/25/16 0407 09/25/16 2128  09/27/16 0259 09/28/16 1624 09/29/16 0352  AST 62* 53*  --  41  --   --   ALT 24 23  --  22  --   --   ALKPHOS 126 113  --  102  --   --   BILITOT 6.8* 6.7*  --  6.8*  --   --   PROT 7.4  7.1  --  7.1  --   --   ALBUMIN 2.3* 2.3*  < > 2.2* 1.8* 1.7*  < > = values in this interval not displayed.  Recent Labs Lab 09/14/2016 1120 09/19/2016 1815  09/25/16 2128  09/26/16 0745 09/27/16 0259 09/28/16 1359  WBC 12.0* 10.1  < > 11.6*  < > 11.6* 9.5 13.3*  NEUTROABS 11.1* 9.1*  --  10.6*  --   --   --   --   HGB 12.5* 12.3*  < > 11.7*  < > 12.3* 12.2* 13.4  HCT 36.7* 35.6*  < > 34.4*  < > 36.1* 36.5* 38.5*  MCV 79.3 78.8  < > 78.7  < > 79.2 79.5 78.1  PLT 83* 85*  < > 81*  < > 76* 83* 127*  < > = values in this interval not displayed. Iron/TIBC/Ferritin/ %Sat    Component Value Date/Time   IRON 32 (L) 08/12/2013 1136   IRON 28 (L) 08/12/2013 1136   TIBC 272 08/12/2013 1136   FERRITIN 291.5 08/12/2013 1136   IRONPCTSAT 12 (L) 08/12/2013 1136

## 2016-09-29 NOTE — Progress Notes (Signed)
PULMONARY / CRITICAL CARE MEDICINE   Name: Trevor [name] MRN: [id number] DOB: [birth year]    ADMISSION DATE:  10/03/2016 CONSULTATION DATE:  1/19  REFERRING MD:  Sloan Leiter  CHIEF COMPLAINT:   Progressive acute hypoxic respiratory failure, SIRS/sepsis AND worsening Encephalopathy   brief 66 year old male w/ sig h/o systolic CM (EF 37-34%), AF, (not on AC)  ESRD TTS, and cardiac related cirrhosis. Admitted 1/17 w/ 3 d h/o URI symptoms, temp as high as 103, worsening malaise and progressive dyspnea. The day of admit his wife found him to be disoriented. On arrival to the ER his room air sats were in 80s. His MS and sats improved w/ oxygen. He was admitted w/ working dx of HCAP. Placed on broad spec abx and admitted to the SDU. From 1/17 to 1/19 his confusion actually continued to worsen. His RVP PCR was positive for Coronavirus. He underwent HD X 2 (From time of admit to 1/19). When he returned to the SDU on 1/19 after HD (net -~2.5 L) he was found to be more confused, post HS CXR w/ progression of bilateral R>L infiltrates and O2 requirements up to 5 liters w/ RR in 30s to 40s. PCCM was asked to see given concern that his clinical course had been deteriorating.   STUDIES:  09/09/2016 - admit ECHO 1/19>>>ef 45% RVP PCR 1/18: Coronavirus HKUI BCX2 1/17>>> BAL 1/19 >>> multiple organisms, none predominant    SUBJECTIVE/OVERNIGHT/INTERVAL HX Afebrile overnight. CRRT started. Responds to painful stimuli, but not verbal.    VITAL SIGNS: BP (!) 76/60   Pulse 62   Temp 97.3 F (36.3 C) (Oral)   Resp (!) 28   Ht _0  (1.854 m)   Wt 204 lb 12.9 oz (92.9 kg)   SpO2 98%   BMI 27.02 kg/m  5 liters  HEMODYNAMICS: CVP:  [15 mmHg-17 mmHg] 15 mmHg  VENTILATOR SETTINGS: Vent Mode: PRVC FiO2 (%):  [40 %-50 %] 40 % Set Rate:  [28 bmp] 28 bmp Vt Set:  [480 mL] 480 mL PEEP:  [8 cmH20] 8 cmH20 Plateau Pressure:  [22 cmH20-25 cmH20] 22 cmH20  INTAKE / OUTPUT:  Intake/Output Summary (Last  24 hours) at 09/29/16 0646 Last data filed at 09/29/16 0600  Gross per 24 hour  Intake          5097.64 ml  Output             3049 ml  Net          2048.64 ml     General appearance: Adult man, intubated HEENT: [place]/AT, PERRL, sclera anicteric, ETT in place, mucus membranes moist Neck: Trachea midline; neck supple, no JVD, left HD cath and RIJ CVL in place Lungs/chest: Coarse breath sounds bilaterally, sync with vent CV: irregularly irregular w/ 3/6 holosystolic murmur. LUE AVG w/ good bruit and thrill  Abdomen: distended and firm but non-tender; no masses  Extremities: No peripheral edema or extremity lymphadenopathy Skin: warm temperature, turgor and texture nml; old healed papular rash on back, no ulcers or subcutaneous nodules Neuro: rass -3 on sedation  gtt  LABS: PULMONARY  Recent Labs Lab 10/02/2016 1201 09/26/16 1753 09/27/16 0342  PHART  --  7.475* 7.431  PCO2ART  --  40.6 39.9  PO2ART  --  312.0* 84.0  HCO3 27.2 29.9* 26.6  TCO2 _1 O2SAT 87.0 100.0 97.0    CBC  Recent Labs Lab 09/26/16 0745 09/27/16 0259 09/28/16 1359  HGB 12.3* 12.2* 13.4  HCT 36.1* 36.5* 38.5*  WBC 11.6* 9.5 13.3*  PLT 76* 83* 127*    COAGULATION  Recent Labs Lab 09/19/2016 1815  INR 1.53    CARDIAC    Recent Labs Lab 09/27/16 0959 09/27/16 1700 09/27/16 2311 09/28/16 0359 09/28/16 1359  TROPONINI 0.17* 0.26* 0.11* 0.17* 0.14*   No results for input(s): PROBNP in the last 168 hours.   CHEMISTRY  Recent Labs Lab 09/26/16 0740 09/27/16 0259 09/27/16 1426 09/27/16 1700 09/28/16 0409 09/28/16 1359 09/28/16 1624 09/29/16 0352  NA 135 137  --   --   --  137 137 139  K 4.1 3.8  --   --   --  4.0 4.0 3.8  CL 97* 98*  --   --   --  103 103 106  CO2 25 23  --   --   --  21* 23 24  GLUCOSE 92 67  --   --   --  98 111* 124*  BUN 54* 38*  --   --   --  63* 57* 44*  CREATININE 6.79* 5.17*  --   --   --  6.92* 6.10* 4.66*  CALCIUM 9.3 9.3  --   --   --  8.6*  8.6* 8.4*  MG  --   --  2.2 2.2 2.4  --  2.6* 2.4  PHOS 4.1  --  4.6 5.0* 5.5*  --  5.3*  5.5* 4.4   Estimated Creatinine Clearance: 18.3 mL/min (by C-G formula based on SCr of 4.66 mg/dL (H)).   LIVER  Recent Labs Lab 09/10/2016 1120 09/26/2016 1815 09/25/16 0407 09/25/16 2128 09/26/16 0355 09/26/16 0740 09/27/16 0259 09/28/16 1624 09/29/16 0352  AST 74* 67* 62* 53*  --   --  41  --   --   ALT _0 --   --  22  --   --   ALKPHOS 141* 132* 126 113  --   --  102  --   --   BILITOT 6.0* 6.1* 6.8* 6.7*  --   --  6.8*  --   --   PROT 7.8 7.2 7.4 7.1  --   --  7.1  --   --   ALBUMIN 2.5* 2.3* 2.3* 2.3* 2.2* 2.2* 2.2* 1.8* 1.7*  INR  --  1.53  --   --   --   --   --   --   --      INFECTIOUS  Recent Labs Lab 09/25/16 0708 09/26/16 1237 09/26/16 1629 09/26/16 1822 09/27/16 0259 09/28/16 0359  LATICACIDVEN 2.3* 2.7* 2.1*  --   --   --   PROCALCITON  --   --   --  37.98 35.92 28.96     ENDOCRINE CBG (last 3)   Recent Labs  09/28/16 2014 09/29/16 0008 09/29/16 0357  GLUCAP 119* 105* 114*         IMAGING x48h  - image(s) personally visualized  -   highlighted in bold Dg Chest Portable 1 View  Result Date: 09/28/2016 CLINICAL DATA:  Central line placement. EXAM: PORTABLE CHEST 1 VIEW COMPARISON:  09/27/2016 FINDINGS: Endotracheal tube terminates approximately 3.5 cm above the carina. Right jugular catheter is unchanged, with tip overlying the upper SVC. A larger caliber left jugular catheter has been placed and also terminates over the upper SVC. Enteric tube terminates over the proximal stomach. Cardiac silhouette remains enlarged. Perihilar and bibasilar lung opacities on the prior study  have mildly improved in the interim. No sizable pleural effusion or pneumothorax is identified. IMPRESSION: 1. Left jugular catheter terminates over the upper SVC. 2. Cardiomegaly with mild improvement of bilateral airspace opacities which may reflect decreased edema.  Electronically Signed   By: Logan Bores M.D.   On: 09/28/2016 13:50   Dg Chest Port 1v Same Day  Result Date: 09/27/2016 CLINICAL DATA:  Central line placement EXAM: PORTABLE CHEST 1 VIEW COMPARISON:  09/26/2016 FINDINGS: Cardiomediastinal silhouette is stable. Bilateral perihilar and infrahilar air space opacities and interstitial prominence again noted. Findings may be due to bilateral pneumonia or bilateral pulmonary edema. Endotracheal tube in place with tip 3.3 cm above the carina. NG tube in place. Right IJ central line is stable in position with tip in upper SVC. No pneumothorax. IMPRESSION: Bilateral perihilar and infrahilar air space opacities and interstitial prominence again noted. Findings may be due to bilateral pneumonia or bilateral pulmonary edema. Endotracheal tube in place with tip 3.3 cm above the carina. NG tube in place. Right IJ central line is stable in position with tip in upper SVC. No pneumothorax. Electronically Signed   By: Lahoma Crocker M.D.   On: 09/27/2016 17:54        ANTIBIOTICS: vanc 1/17>>1/21 Zosyn 1/17>>> levaquin 1/19>>> 1/21  SIGNIFICANT EVENTS:   LINES/TUBES: ETT 1/19>> RIJ CVL 1/19>> Left HD cath 1/21>>  DISCUSSION: 89 yom w/ advanced systolic CM (EF 16-10%), ESRD, and AF. Admitted 1/17 w/ viral PNA +/- HCAP. Now w/ progressive hypoxia, pulmonary infiltrates and respiratory failure. Looks like evolving ALI w/ persistent SIRS response. Placed on ARDS protocol.   ASSESSMENT / PLAN:  PULMONARY A: Acute hypoxic Respiratory failure in the setting of progressive and bilateral pulmonary infiltrates.  Presume viral Pneumonitis +/- HCAP  W/ progressive ARDS   P:   ARDS protocol Wean as able PAD protocol  Abx as above F/u BAL- multiple organisms   CARDIOVASCULAR A:  Af w/ RVR SIRS/sepsis  Troponin elevation, likely demand  Chronic systolic HF w/ EF 96-04%  P:  Digoxin Q48 hours with ESRD Tele MAP >60 On Neo, wean as  tolerated Volume/pressors as indicated  RENAL A:   ESRD-TTS P:   CRRT started Renal following bmet in AM  GASTROINTESTINAL A:   Hepatic congestion/ w/ hepatonephromegaly w/ presumed cardiac related Cirrhosis (has had prior paracentesis on 1/12) Ascites s/p para on 1/12- no SBP Nutrition P:   TFs  Pepcid  HEMATOLOGIC A:   Thrombocytopenia- at baseline  Remote DVT P:  Trend CBC Transfuse per protocol   INFECTIOUS A:   Viral PNA + Coronavirus HKU1 HCAP Sirs/sepsis  P:   F/u BAL- multiple organisms  F/u HIV- nonreactive  Cont current abx as above   ENDOCRINE A:   Hypoglycemia  P:   Cont D5  Cont cbgs  Avoid SSI unless consistently > 200  NEUROLOGIC A:   Acute encephalopathy in setting of sepsis and cirrhosis  P:   RASS goal: -1 to -2 PAD protocol   FAMILY  - Updates: son at bedside updated 09/29/2016   - Inter-disciplinary family meet or Palliative Care meeting due by:  1/26   Albin Felling, MD, MPH Internal Medicine Resident, PGY-III Pager: [phone]  09/29/2016 6:46 AM

## 2016-09-29 NOTE — Progress Notes (Addendum)
Digoxin held per Dr Alvia Grove for heart rate 55-60. Attempting to remove fluid with CRRT per Dr Alvia Grove.

## 2016-09-29 NOTE — Progress Notes (Signed)
Pharmacy Antibiotic Note  Trevor [name] is a 66 y.o. male admitted on Oct 02, 2016 with AMS, productive cough, and fever. Continues to be on abx for sepsis/PNA. CXR shows bilateral pulm infiltrates, progressive ARDS. Afebrile, WBC down to 11.6, LA 2.1. BCx continue to be no growth.  Plan: Continue Zosyn 3.375 g IV Q6 with plan to stop after 7 days (1/24 stop date)   Height: 6\' 1"  (185.4 cm) Weight: 204 lb 12.9 oz (92.9 kg) IBW/kg (Calculated) : 79.9  Temp (24hrs), Avg:97.5 F (36.4 C), Min:97.3 F (36.3 C), Max:97.8 F (36.6 C)   Recent Labs Lab 10-02-2016 1815 10/02/16 2144  09/25/16 0708 09/25/16 2128 09/26/16 0355 09/26/16 0740 09/26/16 0745 09/26/16 1237 09/26/16 1629 09/27/16 0259 09/28/16 1359 09/28/16 1624 09/29/16 0352  WBC 10.1  --   < >  --  11.6* 11.2*  --  11.6*  --   --  9.5 13.3*  --   --   CREATININE 7.42*  --   < >  --  6.11* 6.46* 6.79*  --   --   --  5.17* 6.92* 6.10* 4.66*  LATICACIDVEN 2.6* 3.2*  --  2.3*  --   --   --   --  2.7* 2.1*  --   --   --   --   < > = values in this interval not displayed.  Estimated Creatinine Clearance: 18.3 mL/min (by C-G formula based on SCr of 4.66 mg/dL (H)).    Allergies  Allergen Reactions  . Pork-Derived Products Other (See Comments)    No pork products for religious reasons    Antimicrobials this admission: 1/17 Zosyn >> (1/24) 1/17 Vanc >> 1/21 1/19 Levofloxacin>>1/19  Microbiology results: 1/17 BCx: ngtd 1/17 Resp panel: + Coronavirus  1/19 resp xc: ngtd 1/20 HIV: neg  Thank you for allowing pharmacy to be a part of this patient's care.  Sherrlyn Hock, PharmD Candidate  09/29/2016 1:14 PM

## 2016-09-29 NOTE — Progress Notes (Signed)
[facility]   Reason for visit: Follow up on pneumonia  Interval History: on neo, high PEEP, on CRRT; no response from patient; HIV negative  CXR independently reviewed and less edema  Physical Exam: Constitutional:  Vitals:   09/29/16 0811 09/29/16 0900  BP:  (!) 85/72  Pulse:  63  Resp:  (!) 28  Temp: 97.7 F (36.5 C)   intubated, no response Eyes: anicteric HENT: +ET Respiratory: on vent; diffuse rhonchi Cardiovascular: Tachy irr irr, + murmur GI: soft, nt, nd  Review of Systems: Unable to be assessed due to mental status  Lab Results  Component Value Date   WBC 13.3 (H) 09/28/2016   HGB 13.4 09/28/2016   HCT 38.5 (L) 09/28/2016   MCV 78.1 09/28/2016   PLT 127 (L) 09/28/2016    Lab Results  Component Value Date   CREATININE 4.66 (H) 09/29/2016   BUN 44 (H) 09/29/2016   NA 139 09/29/2016   K 3.8 09/29/2016   CL 106 09/29/2016   CO2 24 09/29/2016    Lab Results  Component Value Date   ALT 22 09/27/2016   AST 41 09/27/2016   ALKPHOS 102 09/27/2016     Microbiology: Recent Results (from the past 240 hour(s))  Anaerobic culture     Status: None   Collection Time: 09/19/16  3:22 PM  Result Value Ref Range Status   Specimen Description PERITONEAL  Final   Special Requests NONE  Final   Culture   Final    NO ANAEROBES ISOLATED Performed at [facility]    Report Status 09/23/2016 FINAL  Final  Body fluid culture     Status: None   Collection Time: 09/19/16  3:22 PM  Result Value Ref Range Status   Specimen Description PERITONEAL  Final   Special Requests NONE  Final   Gram Stain   Final    FEW WBC PRESENT, PREDOMINANTLY MONONUCLEAR NO ORGANISMS SEEN    Culture   Final    NO GROWTH 3 DAYS Performed at [facility], [address] [place], [place] [address]    Report Status 09/23/2016 FINAL  Final  Acid Fast Smear (AFB)     Status: None   Collection Time: 09/19/16  3:22 PM  Result Value Ref Range  Status   AFB Specimen Processing Concentration  Final   Acid Fast Smear Negative  Final    Comment: (NOTE) Performed At: [facility] [address] [place], [place] [id number] Mila Homer MD [id number]    Source (AFB) PERITONEAL  Final  Blood Culture (routine x 2)     Status: None (Preliminary result)   Collection Time: 2016/10/18 11:10 AM  Result Value Ref Range Status   Specimen Description BLOOD RIGHT ARM  Final   Special Requests BOTTLES DRAWN AEROBIC AND ANAEROBIC 5CC  Final   Culture NO GROWTH 4 DAYS  Final   Report Status PENDING  Incomplete  Blood Culture (routine x 2)     Status: None (Preliminary result)   Collection Time: Oct 18, 2016 11:20 AM  Result Value Ref Range Status   Specimen Description BLOOD RIGHT ANTECUBITAL  Final   Special Requests BOTTLES DRAWN AEROBIC AND ANAEROBIC 5CC  Final   Culture NO GROWTH 4 DAYS  Final   Report Status PENDING  Incomplete  Respiratory Panel by PCR     Status: Abnormal   Collection Time: 09/25/16  3:19 PM  Result Value Ref Range Status   Adenovirus  NOT DETECTED NOT DETECTED Final   Coronavirus 229E NOT DETECTED NOT DETECTED Final   Coronavirus HKU1 DETECTED (A) NOT DETECTED Final   Coronavirus NL63 NOT DETECTED NOT DETECTED Final   Coronavirus OC43 NOT DETECTED NOT DETECTED Final   Metapneumovirus NOT DETECTED NOT DETECTED Final   Rhinovirus / Enterovirus NOT DETECTED NOT DETECTED Final   Influenza A NOT DETECTED NOT DETECTED Final   Influenza B NOT DETECTED NOT DETECTED Final   Parainfluenza Virus 1 NOT DETECTED NOT DETECTED Final   Parainfluenza Virus 2 NOT DETECTED NOT DETECTED Final   Parainfluenza Virus 3 NOT DETECTED NOT DETECTED Final   Parainfluenza Virus 4 NOT DETECTED NOT DETECTED Final   Respiratory Syncytial Virus NOT DETECTED NOT DETECTED Final   Bordetella pertussis NOT DETECTED NOT DETECTED Final   Chlamydophila pneumoniae NOT DETECTED NOT DETECTED Final   Mycoplasma pneumoniae NOT DETECTED NOT  DETECTED Final  Culture, respiratory (NON-Expectorated)     Status: None   Collection Time: 09/26/16  5:09 PM  Result Value Ref Range Status   Specimen Description BRONCHIAL ALVEOLAR LAVAGE  Final   Special Requests RIGHT  Final   Gram Stain   Final    ABUNDANT WBC PRESENT, PREDOMINANTLY PMN FEW SQUAMOUS EPITHELIAL CELLS PRESENT ABUNDANT GRAM POSITIVE COCCI IN PAIRS IN CLUSTERS ABUNDANT GRAM NEGATIVE RODS ABUNDANT GRAM NEGATIVE COCCI FEW GRAM POSITIVE RODS    Culture MULTIPLE ORGANISMS PRESENT, NONE PREDOMINANT  Final   Report Status 09/28/2016 FINAL  Final  MRSA PCR Screening     Status: None   Collection Time: 09/26/16  6:18 PM  Result Value Ref Range Status   MRSA by PCR NEGATIVE NEGATIVE Final    Comment:        The GeneXpert MRSA Assay (FDA approved for NASAL specimens only), is one component of a comprehensive MRSA colonization surveillance program. It is not intended to diagnose MRSA infection nor to guide or monitor treatment for MRSA infections.     Impression/Plan:  1. Respiratory failure - + Coronavirus, no culture growth, also on pip/tazo for now.   2.  ARDS - on high PEEP.   3.  Sepsis - requiring pressors 4. Afib with RVR - on digoxin 5. ESRD - on CRRT 6. Thrombocytopenia - improved yesterday

## 2016-09-30 ENCOUNTER — Inpatient Hospital Stay (HOSPITAL_COMMUNITY): Payer: Medicare Other

## 2016-09-30 DIAGNOSIS — J80 Acute respiratory distress syndrome: Secondary | ICD-10-CM

## 2016-09-30 LAB — POCT I-STAT, CHEM 8
BUN: 23 mg/dL — ABNORMAL HIGH (ref 6–20)
BUN: 31 mg/dL — ABNORMAL HIGH (ref 6–20)
BUN: 21 mg/dL — ABNORMAL HIGH (ref 6–20)
BUN: 31 mg/dL — ABNORMAL HIGH (ref 6–20)
BUN: 22 mg/dL — ABNORMAL HIGH (ref 6–20)
BUN: 29 mg/dL — ABNORMAL HIGH (ref 6–20)
BUN: 21 mg/dL — ABNORMAL HIGH (ref 6–20)
BUN: 30 mg/dL — ABNORMAL HIGH (ref 6–20)
CALCIUM ION: 1.22 mmol/L (ref 1.15–1.40)
CALCIUM ION: 1.24 mmol/L (ref 1.15–1.40)
CALCIUM ION: 0.36 mmol/L — AB (ref 1.15–1.40)
CALCIUM ION: 1.1 mmol/L — AB (ref 1.15–1.40)
CALCIUM ION: 0.37 mmol/L — AB (ref 1.15–1.40)
CALCIUM ION: 1.06 mmol/L — AB (ref 1.15–1.40)
CALCIUM ION: 0.36 mmol/L — AB (ref 1.15–1.40)
CALCIUM ION: 1.05 mmol/L — AB (ref 1.15–1.40)
CHLORIDE: 104 mmol/L (ref 101–111)
CHLORIDE: 99 mmol/L — AB (ref 101–111)
CHLORIDE: 98 mmol/L — AB (ref 101–111)
CREATININE: 2.6 mg/dL — AB (ref 0.61–1.24)
Chloride: 102 mmol/L (ref 101–111)
Chloride: 103 mmol/L (ref 101–111)
Chloride: 103 mmol/L (ref 101–111)
Chloride: 98 mmol/L — ABNORMAL LOW (ref 101–111)
Chloride: 100 mmol/L — ABNORMAL LOW (ref 101–111)
Creatinine, Ser: 1.5 mg/dL — ABNORMAL HIGH (ref 0.61–1.24)
Creatinine, Ser: 2.9 mg/dL — ABNORMAL HIGH (ref 0.61–1.24)
Creatinine, Ser: 1.4 mg/dL — ABNORMAL HIGH (ref 0.61–1.24)
Creatinine, Ser: 2.9 mg/dL — ABNORMAL HIGH (ref 0.61–1.24)
Creatinine, Ser: 1.6 mg/dL — ABNORMAL HIGH (ref 0.61–1.24)
Creatinine, Ser: 2.5 mg/dL — ABNORMAL HIGH (ref 0.61–1.24)
Creatinine, Ser: 1.7 mg/dL — ABNORMAL HIGH (ref 0.61–1.24)
GLUCOSE: 126 mg/dL — AB (ref 65–99)
GLUCOSE: 141 mg/dL — AB (ref 65–99)
GLUCOSE: 224 mg/dL — AB (ref 65–99)
GLUCOSE: 166 mg/dL — AB (ref 65–99)
GLUCOSE: 231 mg/dL — AB (ref 65–99)
GLUCOSE: 214 mg/dL — AB (ref 65–99)
GLUCOSE: 160 mg/dL — AB (ref 65–99)
Glucose, Bld: 177 mg/dL — ABNORMAL HIGH (ref 65–99)
HCT: 49 % (ref 39.0–52.0)
HCT: 49 % (ref 39.0–52.0)
HCT: 54 % — ABNORMAL HIGH (ref 39.0–52.0)
HCT: 49 % (ref 39.0–52.0)
HEMATOCRIT: 53 % — AB (ref 39.0–52.0)
HEMATOCRIT: 54 % — AB (ref 39.0–52.0)
HEMATOCRIT: 51 % (ref 39.0–52.0)
HEMATOCRIT: 54 % — AB (ref 39.0–52.0)
HEMOGLOBIN: 18 g/dL — AB (ref 13.0–17.0)
HEMOGLOBIN: 18.4 g/dL — AB (ref 13.0–17.0)
HEMOGLOBIN: 16.7 g/dL (ref 13.0–17.0)
HEMOGLOBIN: 18.4 g/dL — AB (ref 13.0–17.0)
HEMOGLOBIN: 16.7 g/dL (ref 13.0–17.0)
Hemoglobin: 16.7 g/dL (ref 13.0–17.0)
Hemoglobin: 17.3 g/dL — ABNORMAL HIGH (ref 13.0–17.0)
Hemoglobin: 18.4 g/dL — ABNORMAL HIGH (ref 13.0–17.0)
POTASSIUM: 3.9 mmol/L (ref 3.5–5.1)
Potassium: 4.1 mmol/L (ref 3.5–5.1)
Potassium: 4.2 mmol/L (ref 3.5–5.1)
Potassium: 4.1 mmol/L (ref 3.5–5.1)
Potassium: 3.8 mmol/L (ref 3.5–5.1)
Potassium: 3.8 mmol/L (ref 3.5–5.1)
Potassium: 3.8 mmol/L (ref 3.5–5.1)
Potassium: 3.8 mmol/L (ref 3.5–5.1)
SODIUM: 140 mmol/L (ref 135–145)
SODIUM: 142 mmol/L (ref 135–145)
SODIUM: 140 mmol/L (ref 135–145)
SODIUM: 141 mmol/L (ref 135–145)
SODIUM: 142 mmol/L (ref 135–145)
SODIUM: 142 mmol/L (ref 135–145)
Sodium: 141 mmol/L (ref 135–145)
Sodium: 141 mmol/L (ref 135–145)
TCO2: 24 mmol/L (ref 0–100)
TCO2: 28 mmol/L (ref 0–100)
TCO2: 22 mmol/L (ref 0–100)
TCO2: 23 mmol/L (ref 0–100)
TCO2: 22 mmol/L (ref 0–100)
TCO2: 25 mmol/L (ref 0–100)
TCO2: 26 mmol/L (ref 0–100)
TCO2: 26 mmol/L (ref 0–100)

## 2016-09-30 LAB — BLOOD GAS, ARTERIAL
Acid-base deficit: 0.9 mmol/L (ref 0.0–2.0)
BICARBONATE: 24.1 mmol/L (ref 20.0–28.0)
Drawn by: 449561
FIO2: 40
LHR: 28 {breaths}/min
MECHVT: 480 mL
O2 Saturation: 96.6 %
PEEP: 5 cmH2O
PO2 ART: 93.6 mmHg (ref 83.0–108.0)
Patient temperature: 97.3
pCO2 arterial: 43.7 mmHg (ref 32.0–48.0)
pH, Arterial: 7.356 (ref 7.350–7.450)

## 2016-09-30 LAB — RENAL FUNCTION PANEL
ALBUMIN: 1.7 g/dL — AB (ref 3.5–5.0)
ALBUMIN: 1.7 g/dL — AB (ref 3.5–5.0)
ANION GAP: 7 (ref 5–15)
Anion gap: 9 (ref 5–15)
BUN: 32 mg/dL — ABNORMAL HIGH (ref 6–20)
BUN: 32 mg/dL — AB (ref 6–20)
CALCIUM: 8.8 mg/dL — AB (ref 8.9–10.3)
CHLORIDE: 104 mmol/L (ref 101–111)
CO2: 23 mmol/L (ref 22–32)
CO2: 23 mmol/L (ref 22–32)
CREATININE: 3.31 mg/dL — AB (ref 0.61–1.24)
CREATININE: 2.92 mg/dL — AB (ref 0.61–1.24)
Calcium: 8.5 mg/dL — ABNORMAL LOW (ref 8.9–10.3)
Chloride: 106 mmol/L (ref 101–111)
GFR calc non Af Amer: 18 mL/min — ABNORMAL LOW (ref >60)
GFR calc non Af Amer: 21 mL/min — ABNORMAL LOW (ref >60)
GFR, EST AFRICAN AMERICAN: 21 mL/min — AB (ref >60)
GFR, EST AFRICAN AMERICAN: 25 mL/min — AB (ref >60)
GLUCOSE: 114 mg/dL — AB (ref 65–99)
Glucose, Bld: 172 mg/dL — ABNORMAL HIGH (ref 65–99)
PHOSPHORUS: 3.9 mg/dL (ref 2.5–4.6)
PHOSPHORUS: 3.9 mg/dL (ref 2.5–4.6)
POTASSIUM: 4.1 mmol/L (ref 3.5–5.1)
Potassium: 4.2 mmol/L (ref 3.5–5.1)
SODIUM: 136 mmol/L (ref 135–145)
Sodium: 136 mmol/L (ref 135–145)

## 2016-09-30 LAB — GLUCOSE, CAPILLARY
GLUCOSE-CAPILLARY: 98 mg/dL (ref 65–99)
GLUCOSE-CAPILLARY: 134 mg/dL — AB (ref 65–99)
GLUCOSE-CAPILLARY: 139 mg/dL — AB (ref 65–99)
GLUCOSE-CAPILLARY: 123 mg/dL — AB (ref 65–99)
Glucose-Capillary: 133 mg/dL — ABNORMAL HIGH (ref 65–99)

## 2016-09-30 LAB — CBC
HEMATOCRIT: 43.3 % (ref 39.0–52.0)
Hemoglobin: 15.2 g/dL (ref 13.0–17.0)
MCH: 27.3 pg (ref 26.0–34.0)
MCHC: 35.1 g/dL (ref 30.0–36.0)
MCV: 77.7 fL — ABNORMAL LOW (ref 78.0–100.0)
Platelets: 125 10*3/uL — ABNORMAL LOW (ref 150–400)
RBC: 5.57 MIL/uL (ref 4.22–5.81)
RDW: 18.7 % — AB (ref 11.5–15.5)
WBC: 14.6 10*3/uL — ABNORMAL HIGH (ref 4.0–10.5)

## 2016-09-30 LAB — MAGNESIUM: MAGNESIUM: 2.4 mg/dL (ref 1.7–2.4)

## 2016-09-30 LAB — TROPONIN I: Troponin I: 0.12 ng/mL (ref <0.03)

## 2016-09-30 MED ORDER — PIPERACILLIN-TAZOBACTAM 3.375 G IVPB 30 MIN
3.3750 g | Freq: Four times a day (QID) | INTRAVENOUS | Status: AC
  Administered 2016-09-30 – 2016-10-01 (×6): 3.375 g via INTRAVENOUS
  Filled 2016-09-30 (×6): qty 50

## 2016-09-30 MED ORDER — DEXTROSE 5 % IV SOLN
INTRAVENOUS | Status: DC
  Administered 2016-09-30 – 2016-10-05 (×11): via INTRAVENOUS_CENTRAL
  Filled 2016-09-30 (×20): qty 1500

## 2016-09-30 MED ORDER — PRISMASOL BGK 4/2.5 32-4-2.5 MEQ/L IV SOLN
INTRAVENOUS | Status: DC
  Administered 2016-09-30 – 2016-10-05 (×5): via INTRAVENOUS_CENTRAL
  Filled 2016-09-30 (×10): qty 5000

## 2016-09-30 MED ORDER — SODIUM CHLORIDE 0.9 % FOR CRRT
INTRAVENOUS_CENTRAL | Status: DC | PRN
  Filled 2016-09-30: qty 1000

## 2016-09-30 MED ORDER — NOREPINEPHRINE BITARTRATE 1 MG/ML IV SOLN
0.0000 ug/min | INTRAVENOUS | Status: DC
  Administered 2016-09-30: 5 ug/min via INTRAVENOUS
  Administered 2016-09-30: 17 ug/min via INTRAVENOUS
  Filled 2016-09-30 (×3): qty 4

## 2016-09-30 MED ORDER — HEPARIN SODIUM (PORCINE) 1000 UNIT/ML DIALYSIS
1000.0000 [IU] | INTRAMUSCULAR | Status: DC | PRN
  Filled 2016-09-30: qty 6

## 2016-09-30 MED ORDER — SODIUM CHLORIDE 0.9 % IV SOLN: INTRAVENOUS | Status: DC | PRN

## 2016-09-30 MED ORDER — HEPARIN SODIUM (PORCINE) 5000 UNIT/ML IJ SOLN
5000.0000 [IU] | Freq: Three times a day (TID) | INTRAMUSCULAR | Status: DC
  Administered 2016-09-30 – 2016-10-05 (×15): 5000 [IU] via SUBCUTANEOUS
  Filled 2016-09-30 (×18): qty 1

## 2016-09-30 MED ORDER — DEXTROSE 5 % IV SOLN
20.0000 g | INTRAVENOUS | Status: DC
  Administered 2016-09-30 – 2016-10-05 (×8): 20 g via INTRAVENOUS_CENTRAL
  Filled 2016-09-30 (×14): qty 200

## 2016-09-30 MED ORDER — ALTEPLASE 2 MG IJ SOLR
2.0000 mg | Freq: Once | INTRAMUSCULAR | Status: DC | PRN
  Filled 2016-09-30: qty 2

## 2016-09-30 MED ORDER — DIGOXIN 0.0625 MG HALF TABLET
0.0625 mg | ORAL_TABLET | ORAL | Status: DC
  Administered 2016-09-30 – 2016-10-04 (×3): 0.0625 mg
  Filled 2016-09-30 (×3): qty 1

## 2016-09-30 MED ORDER — PRISMASOL BGK 4/2.5 32-4-2.5 MEQ/L IV SOLN
INTRAVENOUS | Status: DC
  Filled 2016-09-30: qty 5000

## 2016-09-30 MED ORDER — NOREPINEPHRINE BITARTRATE 1 MG/ML IV SOLN
0.0000 ug/min | INTRAVENOUS | Status: DC
  Administered 2016-09-30: 26 ug/min via INTRAVENOUS
  Administered 2016-09-30: 25 ug/min via INTRAVENOUS
  Administered 2016-10-01 – 2016-10-02 (×3): 30 ug/min via INTRAVENOUS
  Administered 2016-10-02: 15 ug/min via INTRAVENOUS
  Administered 2016-10-03: 10 ug/min via INTRAVENOUS
  Administered 2016-10-04: 20.053 ug/min via INTRAVENOUS
  Administered 2016-10-05: 12.053 ug/min via INTRAVENOUS
  Administered 2016-10-05: 10.027 ug/min via INTRAVENOUS
  Filled 2016-09-30 (×11): qty 16

## 2016-09-30 MED ORDER — PIPERACILLIN-TAZOBACTAM 3.375 G IVPB: 3.3750 g | Freq: Two times a day (BID) | INTRAVENOUS | Status: DC

## 2016-09-30 MED ORDER — PIPERACILLIN-TAZOBACTAM 3.375 G IVPB 30 MIN: 3.3750 g | Freq: Two times a day (BID) | INTRAVENOUS | Status: DC

## 2016-09-30 MED ORDER — PRISMASOL B22GK 4/0 22-4 MEQ/L IV SOLN
INTRAVENOUS | Status: DC
  Administered 2016-09-30 – 2016-10-05 (×41): via INTRAVENOUS_CENTRAL
  Filled 2016-09-30 (×49): qty 5000

## 2016-09-30 NOTE — Progress Notes (Signed)
[facility]   Reason for visit: Follow up on pneumonia  Interval History: on CRRT; no response from patient; wife at bedside  CXR independently reviewed and no change  Physical Exam: Constitutional:  Vitals:   09/30/16 1230 09/30/16 1245  BP:    Pulse: (!) 133 (!) 145  Resp: (!) 28 (!) 21  Temp:    intubated, no response Eyes: anicteric HENT: +ET Respiratory: on vent; diffuse rhonchi Cardiovascular: Tachy irr irr, + murmur GI: soft, nt, nd  Review of Systems: Unable to be assessed due to mental status  Lab Results  Component Value Date   WBC 14.6 (H) 09/30/2016   HGB 18.0 (H) 09/30/2016   HCT 53.0 (H) 09/30/2016   MCV 77.7 (L) 09/30/2016   PLT 125 (L) 09/30/2016    Lab Results  Component Value Date   CREATININE 1.50 (H) 09/30/2016   BUN 23 (H) 09/30/2016   NA 141 09/30/2016   K 4.1 09/30/2016   CL 102 09/30/2016   CO2 23 09/30/2016    Lab Results  Component Value Date   ALT 22 09/27/2016   AST 41 09/27/2016   ALKPHOS 102 09/27/2016     Microbiology: Recent Results (from the past 240 hour(s))  Blood Culture (routine x 2)     Status: None   Collection Time: 09/29/2016 11:10 AM  Result Value Ref Range Status   Specimen Description BLOOD RIGHT ARM  Final   Special Requests BOTTLES DRAWN AEROBIC AND ANAEROBIC 5CC  Final   Culture NO GROWTH 5 DAYS  Final   Report Status 09/29/2016 FINAL  Final  Blood Culture (routine x 2)     Status: None   Collection Time: 09/13/2016 11:20 AM  Result Value Ref Range Status   Specimen Description BLOOD RIGHT ANTECUBITAL  Final   Special Requests BOTTLES DRAWN AEROBIC AND ANAEROBIC 5CC  Final   Culture NO GROWTH 5 DAYS  Final   Report Status 09/29/2016 FINAL  Final  Respiratory Panel by PCR     Status: Abnormal   Collection Time: 09/25/16  3:19 PM  Result Value Ref Range Status   Adenovirus NOT DETECTED NOT DETECTED Final   Coronavirus 229E NOT DETECTED NOT DETECTED Final   Coronavirus HKU1  DETECTED (A) NOT DETECTED Final   Coronavirus NL63 NOT DETECTED NOT DETECTED Final   Coronavirus OC43 NOT DETECTED NOT DETECTED Final   Metapneumovirus NOT DETECTED NOT DETECTED Final   Rhinovirus / Enterovirus NOT DETECTED NOT DETECTED Final   Influenza A NOT DETECTED NOT DETECTED Final   Influenza B NOT DETECTED NOT DETECTED Final   Parainfluenza Virus 1 NOT DETECTED NOT DETECTED Final   Parainfluenza Virus 2 NOT DETECTED NOT DETECTED Final   Parainfluenza Virus 3 NOT DETECTED NOT DETECTED Final   Parainfluenza Virus 4 NOT DETECTED NOT DETECTED Final   Respiratory Syncytial Virus NOT DETECTED NOT DETECTED Final   Bordetella pertussis NOT DETECTED NOT DETECTED Final   Chlamydophila pneumoniae NOT DETECTED NOT DETECTED Final   Mycoplasma pneumoniae NOT DETECTED NOT DETECTED Final  Culture, respiratory (NON-Expectorated)     Status: None   Collection Time: 09/26/16  5:09 PM  Result Value Ref Range Status   Specimen Description BRONCHIAL ALVEOLAR LAVAGE  Final   Special Requests RIGHT  Final   Gram Stain   Final    ABUNDANT WBC PRESENT, PREDOMINANTLY PMN FEW SQUAMOUS EPITHELIAL CELLS PRESENT ABUNDANT GRAM POSITIVE COCCI IN PAIRS IN CLUSTERS ABUNDANT GRAM NEGATIVE RODS ABUNDANT GRAM NEGATIVE  COCCI FEW GRAM POSITIVE RODS    Culture MULTIPLE ORGANISMS PRESENT, NONE PREDOMINANT  Final   Report Status 09/28/2016 FINAL  Final  MRSA PCR Screening     Status: None   Collection Time: 09/26/16  6:18 PM  Result Value Ref Range Status   MRSA by PCR NEGATIVE NEGATIVE Final    Comment:        The GeneXpert MRSA Assay (FDA approved for NASAL specimens only), is one component of a comprehensive MRSA colonization surveillance program. It is not intended to diagnose MRSA infection nor to guide or monitor treatment for MRSA infections.     Impression/Plan:  1. Respiratory failure - + Coronavirus, no culture growth and was multiple organisms, also on pip/tazo for now.   2.  Sepsis -  remains on pressors; 3. ESRD - on CRRT;

## 2016-09-30 NOTE — Progress Notes (Signed)
Patient's wife called me and I updated her of low BP despite levophed and of patient's condition and unable to remove fluid with CRRT today because of his low BP

## 2016-09-30 NOTE — Progress Notes (Signed)
Patient with heart rate increasing to 145 and mean BP at 55-60 with levophed increased to 20 mcg/min Dr.Amin at bedside and informed. Oxygen sats decreasing to 84 RT in formed .

## 2016-09-30 NOTE — Procedures (Signed)
Arterial Catheter Insertion Procedure Note Kamilo Brandl [id number] Dec 10, 1950  Procedure: Insertion of Arterial Catheter  Indications: Blood pressure monitoring  Procedure Details Consent: Unable to obtain consent because of altered level of consciousness. Time Out: Verified patient identification, verified procedure, site/side was marked, verified correct patient position, special equipment/implants available, medications/allergies/relevent history reviewed, required imaging and test results available.  Performed  Maximum sterile technique was used including antiseptics, cap, gloves, gown and hand hygiene. Skin prep: Chlorhexidine; local anesthetic administered 20 gauge catheter was inserted into right radial artery using the Seldinger technique.  Evaluation Blood flow good; BP tracing good. Complications: No apparent complications.   Ronda Fairly [facility] 09/30/2016

## 2016-09-30 NOTE — Care Management Note (Signed)
Case Management Note  Patient Details  Name: Osman Case MRN: [id number] Date of Birth: 1951-04-21  Subjective/Objective:   Pt admitted with SIRS/Sepsis                  Action/Plan:  PTA from home with wife.    Expected Discharge Date:                  Expected Discharge Plan:     In-House Referral:     Discharge planning Services  CM Consult  Post Acute Care Choice:    Choice offered to:     DME Arranged:    DME Agency:     HH Arranged:    HH Agency:     Status of Service:  In process, will continue to follow  If discussed at Long Length of Stay Meetings, dates discussed:    Additional Comments: 09/30/2016 Discussed in LOS 09/30/16:  Pt remains appropriate for continued stay; pt remains intubated, on ARDS protocol, CRRT started over the past weekend.  Cherylann Parr, RN 09/30/2016, 9:50 AM

## 2016-09-30 NOTE — Progress Notes (Signed)
[facility] Progress Note   Subjective: didn't tolerate UF last night, BP's dropped. Switched neo to levo gtt.  HR 140;s now with afib. , chronic.  Sedated on vent  Vitals:   09/30/16 1515 09/30/16 1530 09/30/16 1539 09/30/16 1545  BP:      Pulse: 100 94  78  Resp: (!) 28 (!) 24  (!) 28  Temp:   97.7 F (36.5 C)   TempSrc:   Oral   SpO2: 94% 94%  (!) 89%  Weight:      Height:        Inpatient medications: . chlorhexidine gluconate (MEDLINE KIT)  15 mL Mouth Rinse BID  . digoxin  0.0625 mg Per Tube Q48H  . doxercalciferol  3 mcg Intravenous Q M,W,F-HD  . famotidine  20 mg Oral Daily  . feeding supplement (PRO-STAT SUGAR FREE 64)  30 mL Per Tube TID  . heparin subcutaneous  5,000 Units Subcutaneous Q8H  . mouth rinse  15 mL Mouth Rinse QID  . midodrine  10 mg Per Tube TID WC  . piperacillin-tazobactam  3.375 g Intravenous Q6H   . sodium chloride Stopped (09/28/16 0700)  . calcium gluconate infusion for CRRT 20 g (09/30/16 1500)  . dextrose 5 % and 0.9% NaCl 50 mL/hr at 09/30/16 1058  . feeding supplement (VITAL 1.5 CAL) 1,000 mL (09/30/16 0827)  . fentaNYL infusion INTRAVENOUS 150 mcg/hr (09/30/16 1515)  . norepinephrine (LEVOPHED) Adult infusion 28 mcg/min (09/30/16 1500)  . dialysate (PRISMASATE) 2,000 mL/hr at 09/30/16 1510  . dialysis replacement fluid (prismasate) 200 mL/hr at 09/30/16 1223  . propofol (DIPRIVAN) infusion 15 mcg/kg/min (09/30/16 1500)  . sodium citrate 2 %/dextrose 2.5% solution 3000 mL 250 mL/hr at 09/30/16 1220   Place/Maintain arterial line **AND** sodium chloride, Place/Maintain arterial line **AND** sodium chloride, albuterol, alteplase, bisacodyl, fentaNYL, heparin, ipratropium-albuterol, sennosides, sodium chloride  Exam: On vent, sedated, on CRRT No jvd Chest clear post Cor irreg irreg Abd soft ntnd Ext 1-2+ UE/LE edema   HD: [facility]  MWF 3h 52mn   2/2 bath  89.5kg   Hep 2000   LUA AVF  - Hect 4  - M-225 last 1/11   - pth 942  29% TFS  P 7      Assessment: 1. Acute resp failure/ pulm infiltrates - viral PNA w Coronavirus HKU1 2. 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 Shock - acute/ chron hypotension, remains on pressors. On midodrine at home.  4. Cirrhosis 5. VDRF 6. Volume - up 3-4 kg by wts 7. CAF/ NICM / ^Cleora Fleet- per cards  Plan - change to citrate AC, keeping even   RKelly SplinterMD [facility] pager [phone]  09/30/2016, 4:00 PM    Recent Labs Lab 09/29/16 1516 09/30/16 0413  09/30/16 1426 09/30/16 1431 09/30/16 1454  NA 138 136  < > 142 140 136  K 4.0 4.2  < > 3.9 4.1 4.1  CL 106 106  < > 99* 103 104  CO2 26 23  --   --   --  23  GLUCOSE 122* 114*  < > 224* 177* 172*  BUN 37* 32*  < > 21* 31* 32*  CREATININE 3.87* 3.31*  < > 1.40* 2.90* 2.92*  CALCIUM 8.6* 8.8*  --   --   --  8.5*  PHOS 4.1 3.9  --   --   --  3.9  < > = values in this interval not displayed.  Recent Labs Lab 09/25/16  0407 09/25/16 2128  09/27/16 0259  09/29/16 1516 09/30/16 0413 09/30/16 1454  AST 62* 53*  --  41  --   --   --   --   ALT 24 23  --  22  --   --   --   --   ALKPHOS 126 113  --  102  --   --   --   --   BILITOT 6.8* 6.7*  --  6.8*  --   --   --   --   PROT 7.4 7.1  --  7.1  --   --   --   --   ALBUMIN 2.3* 2.3*  < > 2.2*  < > 1.7* 1.7* 1.7*  < > = values in this interval not displayed.  Recent Labs Lab 10/02/2016 1120 09/13/2016 1815  09/25/16 2128  09/27/16 0259 09/28/16 1359 09/30/16 0413  09/30/16 1222 09/30/16 1426 09/30/16 1431  WBC 12.0* 10.1  < > 11.6*  < > 9.5 13.3* 14.6*  --   --   --   --   NEUTROABS 11.1* 9.1*  --  10.6*  --   --   --   --   --   --   --   --   HGB 12.5* 12.3*  < > 11.7*  < > 12.2* 13.4 15.2  < > 18.0* 18.4* 17.3*  HCT 36.7* 35.6*  < > 34.4*  < > 36.5* 38.5* 43.3  < > 53.0* 54.0* 51.0  MCV 79.3 78.8  < > 78.7  < > 79.5 78.1 77.7*  --   --   --   --   PLT 83* 85*  < > 81*  < > 83* 127* 125*  --   --   --   --   < > = values in this interval not  displayed. Iron/TIBC/Ferritin/ %Sat    Component Value Date/Time   IRON 32 (L) 08/12/2013 1136   IRON 28 (L) 08/12/2013 1136   TIBC 272 08/12/2013 1136   FERRITIN 291.5 08/12/2013 1136   IRONPCTSAT 12 (L) 08/12/2013 1136

## 2016-09-30 NOTE — Progress Notes (Addendum)
PULMONARY / CRITICAL CARE MEDICINE   Name: Trevor [name] MRN: [id number] DOB: Feb 24, 1951    ADMISSION DATE:  09/21/2016 CONSULTATION DATE:  1/19  REFERRING MD:  Sloan Leiter  CHIEF COMPLAINT:   Progressive acute hypoxic respiratory failure, SIRS/sepsis AND worsening Encephalopathy   brief 66 year old male w/ sig h/o systolic CM (EF 54-00%), AF, (not on AC)  ESRD TTS, and cardiac related cirrhosis. Admitted 1/17 w/ 3 d h/o URI symptoms, temp as high as 103, worsening malaise and progressive dyspnea. The day of admit his wife found him to be disoriented. On arrival to the ER his room air sats were in 80s. His MS and sats improved w/ oxygen. He was admitted w/ working dx of HCAP. Placed on broad spec abx and admitted to the SDU. From 1/17 to 1/19 his confusion actually continued to worsen. His RVP PCR was positive for Coronavirus. He underwent HD X 2 (From time of admit to 1/19). When he returned to the SDU on 1/19 after HD (net -~2.5 L) he was found to be more confused, post HS CXR w/ progression of bilateral R>L infiltrates and O2 requirements up to 5 liters w/ RR in 30s to 40s. PCCM was asked to see given concern that his clinical course had been deteriorating.   STUDIES:  10/01/2016 - admit ECHO 1/19>>>ef 45% RVP PCR 1/18: Coronavirus HKUI BCX2 1/17>>> BAL 1/19 >>> multiple organisms, none predominant    SUBJECTIVE/OVERNIGHT/INTERVAL HX Afebrile overnight. CRRT started. Responds to painful stimuli, but not verbal.    VITAL SIGNS: BP (!) 67/49   Pulse (!) 128   Temp 97.3 F (36.3 C) (Oral)   Resp (!) 28   Ht _0  (1.854 m)   Wt 93.5 kg (206 lb 2.1 oz)   SpO2 92%   BMI 27.20 kg/m  5 liters  HEMODYNAMICS: CVP:  [12 mmHg-18 mmHg] 18 mmHg  VENTILATOR SETTINGS: Vent Mode: PRVC FiO2 (%):  [40 %-50 %] 50 % Set Rate:  [28 bmp] 28 bmp Vt Set:  [480 mL] 480 mL PEEP:  [5 cmH20-8 cmH20] 5 cmH20 Plateau Pressure:  [19 cmH20-25 cmH20] 19 cmH20  INTAKE / OUTPUT:  Intake/Output  Summary (Last 24 hours) at 09/30/16 0715 Last data filed at 09/30/16 0700  Gross per 24 hour  Intake          4713.23 ml  Output             4845 ml  Net          -131.77 ml     General appearance: Adult man, intubated HEENT: [place]/AT, PERRL, sclera icteric, ETT in place, mucus membranes moist Neck: Trachea midline; neck supple, no JVD, left HD cath and RIJ CVL in place Lungs/chest: Coarse breath sounds bilaterally, sync with vent CV: irregularly irregular, tachycardia, w/ 3/6 holosystolic murmur. LUE AVG w/ good bruit and thrill  Abdomen: distended and firm but non-tender; no masses, BS +ve  Extremities: No peripheral edema or extremity lymphadenopathy Skin: warm temperature, turgor and texture nml; old healed papular rash on back, no ulcers or subcutaneous nodules Neuro: rass -3 on sedation  Gtt, no FC  LABS: PULMONARY  Recent Labs Lab 10/08/2016 1201 09/26/16 1753 09/27/16 0342 09/30/16 0315  PHART  --  7.475* 7.431 7.356  PCO2ART  --  40.6 39.9 43.7  PO2ART  --  312.0* 84.0 93.6  HCO3 27.2 29.9* 26.6 24.1  TCO2 _1 --   O2SAT 87.0 100.0 97.0 96.6  CBC  Recent Labs Lab 09/27/16 0259 09/28/16 1359 09/30/16 0413  HGB 12.2* 13.4 15.2  HCT 36.5* 38.5* 43.3  WBC 9.5 13.3* 14.6*  PLT 83* 127* 125*    COAGULATION  Recent Labs Lab 09/29/2016 1815  INR 1.53    CARDIAC    Recent Labs Lab 09/27/16 1700 09/27/16 2311 09/28/16 0359 09/28/16 1359 09/30/16 0413  TROPONINI 0.26* 0.11* 0.17* 0.14* 0.12*   No results for input(s): PROBNP in the last 168 hours.   CHEMISTRY  Recent Labs Lab 09/27/16 1700 09/28/16 0409 09/28/16 1359 09/28/16 1624 09/29/16 0352 09/29/16 1516 09/30/16 0413  NA  --   --  137 137 139 138 136  K  --   --  4.0 4.0 3.8 4.0 4.2  CL  --   --  103 103 106 106 106  CO2  --   --  21* _0 GLUCOSE  --   --  98 111* 124* 122* 114*  BUN  --   --  63* 57* 44* 37* 32*  CREATININE  --   --  6.92* 6.10* 4.66* 3.87* 3.31*   CALCIUM  --   --  8.6* 8.6* 8.4* 8.6* 8.8*  MG 2.2 2.4  --  2.6* 2.4  --  2.4  PHOS 5.0* 5.5*  --  5.3*  5.5* 4.4 4.1 3.9   Estimated Creatinine Clearance: 25.8 mL/min (by C-G formula based on SCr of 3.31 mg/dL (H)).   LIVER  Recent Labs Lab 09/17/2016 1120 09/27/2016 1815 09/25/16 0407 09/25/16 2128  09/27/16 0259 09/28/16 1624 09/29/16 0352 09/29/16 1516 09/30/16 0413  AST 74* 67* 62* 53*  --  41  --   --   --   --   ALT _1 --  22  --   --   --   --   ALKPHOS 141* 132* 126 113  --  102  --   --   --   --   BILITOT 6.0* 6.1* 6.8* 6.7*  --  6.8*  --   --   --   --   PROT 7.8 7.2 7.4 7.1  --  7.1  --   --   --   --   ALBUMIN 2.5* 2.3* 2.3* 2.3*  < > 2.2* 1.8* 1.7* 1.7* 1.7*  INR  --  1.53  --   --   --   --   --   --   --   --   < > = values in this interval not displayed.   INFECTIOUS  Recent Labs Lab 09/25/16 0708 09/26/16 1237 09/26/16 1629 09/26/16 1822 09/27/16 0259 09/28/16 0359  LATICACIDVEN 2.3* 2.7* 2.1*  --   --   --   PROCALCITON  --   --   --  37.98 35.92 28.96     ENDOCRINE CBG (last 3)   Recent Labs  09/29/16 1952 09/29/16 2338 09/30/16 0322  GLUCAP 99 101* 98         IMAGING x48h  - image(s) personally visualized  -   highlighted in bold Dg Chest Port 1 View  Result Date: 09/30/2016 CLINICAL DATA:  Pulmonary edema EXAM: PORTABLE CHEST 1 VIEW COMPARISON:  09/29/2016 FINDINGS: Endotracheal tube, NG tube, right jugular central venous catheter are stable. Left jugular dialysis catheter is stable. Right pleural effusion is stable. Heterogeneous opacities throughout both lungs with relative sparing of the left upper lung zone are stable. The heart remains markedly  enlarged. IMPRESSION: No active disease. Electronically Signed   By: Marybelle Killings M.D.   On: 09/30/2016 07:07   Dg Chest Port 1 View  Result Date: 09/29/2016 CLINICAL DATA:  ARDS. History of CHF, pulmonary embolism, coronary artery disease, dialysis dependent renal  failure. EXAM: PORTABLE CHEST 1 VIEW COMPARISON:  Portable chest x-ray of September 28, 2016. FINDINGS: The lungs are reasonably well expanded. There are patchy interstitial and alveolar opacities bilaterally greatest on the right. The right hemidiaphragm is less well demonstrated today due to a small pleural effusion. The cardiac silhouette remains enlarged. The central pulmonary vascularity is engorged. The endotracheal tube tip projects approximately 4 cm above the carina. The esophagogastric tube tip projects below the inferior margin of the image. The dual-lumen dialysis type catheter tip projects over the proximal SVC. A small caliber right internal jugular venous catheter tip projects at approximately the same level. IMPRESSION: CHF with pulmonary interstitial and alveolar edema greatest on the right. New small to moderate sized right pleural effusion. The support tubes are in reasonable position. Electronically Signed   By: David  Martinique M.D.   On: 09/29/2016 07:15   Dg Chest Portable 1 View  Result Date: 09/28/2016 CLINICAL DATA:  Central line placement. EXAM: PORTABLE CHEST 1 VIEW COMPARISON:  09/27/2016 FINDINGS: Endotracheal tube terminates approximately 3.5 cm above the carina. Right jugular catheter is unchanged, with tip overlying the upper SVC. A larger caliber left jugular catheter has been placed and also terminates over the upper SVC. Enteric tube terminates over the proximal stomach. Cardiac silhouette remains enlarged. Perihilar and bibasilar lung opacities on the prior study have mildly improved in the interim. No sizable pleural effusion or pneumothorax is identified. IMPRESSION: 1. Left jugular catheter terminates over the upper SVC. 2. Cardiomegaly with mild improvement of bilateral airspace opacities which may reflect decreased edema. Electronically Signed   By: Logan Bores M.D.   On: 09/28/2016 13:50        ANTIBIOTICS: vanc 1/17>>1/21 Zosyn 1/17>>> levaquin 1/19>>>  1/21  SIGNIFICANT EVENTS:   LINES/TUBES: ETT 1/19>> RIJ CVL 1/19>> Left HD cath 1/21>> A line 1/22>>  DISCUSSION: 53 yom w/ advanced systolic CM (EF 54-00%), ESRD, and AF. Admitted 1/17 w/ viral PNA +/- HCAP. Now w/ progressive hypoxia, pulmonary infiltrates and respiratory failure. Looks like evolving ALI w/ persistent SIRS response. Placed on ARDS protocol.   ASSESSMENT / PLAN:  PULMONARY A: Acute hypoxic Respiratory failure in the setting of bilateral pulmonary infiltrates- now stable Presume viral Pneumonitis +/- HCAP  W/ progressive ARDS   P:   ARDS protocol Wean as able PAD protocol  Abx as above F/u BAL- multiple organisms   CARDIOVASCULAR A:  Af w/ RVR SIRS/sepsis  Troponin elevation- Now starting to QQPYPPJ(0.93>>2.67) Chronic systolic HF w/ EF 12-45%  P:  Digoxin Q48 hours with ESRD? Tele MAP >60 Transition from Neo>> levo Volume/pressors as indicated  RENAL A:   ESRD-TTS P:   CRRT started Renal following bmet in AM  GASTROINTESTINAL A:   Hepatic congestion/ w/ hepatonephromegaly w/ presumed cardiac related Cirrhosis (has had prior paracentesis on 1/12) Ascites s/p para on 1/12- no SBP Nutrition P:   TFs  Pepcid  HEMATOLOGIC A:   Thrombocytopenia- at baseline  Remote DVT P:  Trend CBC Transfuse per protocol   INFECTIOUS A:   Viral PNA + Coronavirus HKU1 HCAP Sirs/sepsis  P:   F/u BAL- multiple organisms  F/u HIV- nonreactive  Cont current abx as above   ENDOCRINE A:   Hypoglycemia  P:   Cont D5  Cont cbgs  Avoid SSI unless consistently > 200  NEUROLOGIC A:   Acute encephalopathy in setting of sepsis and cirrhosis RASS -4 P:   RASS goal: -1 to -2 PAD protocol   FAMILY  - Updates: No family at bed side.  - Inter-disciplinary family meet or Palliative Care meeting due by:  1/26   Lorella Nimrod, MD  Internal Medicine Resident, PGY-I Pager: [phone]  09/30/2016 7:15 AM  Attending note: I have seen and  examined the patient with nurse practitioner/resident and agree with the note. History, labs and imaging reviewed.  66 year old with cardiomyopathy, atrial fibrillation, end-stage renal disease, cirrhosis admitted with  acute respiratory failure secondary to coronavirus infection, pneumonia, ARDS. Neo changed to Levo last night. Not tolerating fluid removal  Blood pressure (!) 130/115, pulse (!) 146, temperature 97.8 F (36.6 C), temperature source Oral, resp. rate (!) 29, height _0  (1.854 m), weight 206 lb 2.1 oz (93.5 kg), SpO2 93 %. Gen:      No acute distress HEENT:  EOMI, sclera anicteric Neck:     No masses; no thyromegaly Lungs:    Clear to auscultation bilaterally; normal respiratory effort CV:         Regular rate and rhythm; no murmurs Abd:      + bowel sounds; soft, non-tender; no palpable masses, no distension Ext:    No edema; adequate peripheral perfusion Skin:      Warm and dry; no rash Neuro: Sedated, Intubated Psych: normal mood and affect.  - Continue levo, wean as tolerated. - Keep I/O even on CVVH. CVP noted at 14 - Continue zosyn day 6/7 Will call family to update them.  The patient is critically ill with multiple organ systems failure and requires high complexity decision making for assessment and support, frequent evaluation and titration of therapies, application of advanced monitoring technologies and extensive interpretation of multiple databases.  Critical care time - 35 mins. This represents my time independent of the NPs time taking care of the pt.  Marshell Garfinkel MD [facility] Pager [phone] If no answer or after 3pm call: [phone] 09/30/2016, 9:58 AM

## 2016-09-30 NOTE — Progress Notes (Signed)
Progress Note  Patient Name: Trevor [name] Date of Encounter: 09/30/2016  Primary Cardiologist: Dr. Radford Pax   Subjective   Critically ill. Remains intubated.   Inpatient Medications    Scheduled Meds: . chlorhexidine gluconate (MEDLINE KIT)  15 mL Mouth Rinse BID  . digoxin  0.0625 mg Per Tube Q48H  . doxercalciferol  3 mcg Intravenous Q M,W,F-HD  . famotidine  20 mg Oral Daily  . feeding supplement (PRO-STAT SUGAR FREE 64)  30 mL Per Tube TID  . heparin subcutaneous  5,000 Units Subcutaneous Q8H  . mouth rinse  15 mL Mouth Rinse QID  . midodrine  10 mg Per Tube TID WC  . piperacillin-tazobactam  3.375 g Intravenous Q6H   Continuous Infusions: . sodium chloride Stopped (09/28/16 0700)  . calcium gluconate infusion for CRRT    . dextrose 5 % and 0.9% NaCl 50 mL/hr at 09/30/16 1058  . feeding supplement (VITAL 1.5 CAL) 1,000 mL (09/30/16 0827)  . fentaNYL infusion INTRAVENOUS 150 mcg/hr (09/30/16 0900)  . norepinephrine (LEVOPHED) Adult infusion 28 mcg/min (09/30/16 1000)  . dialysate (PRISMASATE)    . dialysis replacement fluid (prismasate)    . dialysis replacement fluid (prismasate)    . propofol (DIPRIVAN) infusion 10 mcg/kg/min (09/30/16 1000)  . sodium citrate 2 %/dextrose 2.5% solution 3000 mL     PRN Meds: Place/Maintain arterial line **AND** sodium chloride, Place/Maintain arterial line **AND** sodium chloride, albuterol, alteplase, bisacodyl, fentaNYL, heparin, ipratropium-albuterol, sennosides, sodium chloride   Vital Signs    Vitals:   09/30/16 1030 09/30/16 1045 09/30/16 1100 09/30/16 1137  BP:      Pulse: (!) 130 91 (!) 116   Resp: 17 (!) 28 (!) 28   Temp:    97.6 F (36.4 C)  TempSrc:    Oral  SpO2: 96% 93% (!) 89%   Weight:      Height:        Intake/Output Summary (Last 24 hours) at 09/30/16 1155 Last data filed at 09/30/16 1100  Gross per 24 hour  Intake          4665.53 ml  Output             4803 ml  Net          -137.47 ml    Filed Weights   09/28/16 0336 09/29/16 0315 09/30/16 0345  Weight: 195 lb 15.8 oz (88.9 kg) 204 lb 12.9 oz (92.9 kg) 206 lb 2.1 oz (93.5 kg)    Telemetry    Atrial fibrillation w CVC - Personally Reviewed   Physical Exam   GEN: inbubated.  Neck: No JVD Cardiac: irregular, regular rate, no murmurs, rubs, or gallops.  Respiratory: Intubated Clear to auscultation bilaterally. GI: Soft, nontender, non-distended  MS: No edema; No deformity. Neuro:  intubated Psych: intubated  Labs    Chemistry Recent Labs Lab 09/25/16 0407 09/25/16 2128  09/27/16 0259  09/29/16 0352 09/29/16 1516 09/30/16 0413  NA 138 134*  < > 137  < > 139 138 136  K 5.4* 4.5  < > 3.8  < > 3.8 4.0 4.2  CL 100* 96*  < > 98*  < > 106 106 106  CO2 24 24  < > 23  < > '24 26 23  ' GLUCOSE 91 111*  < > 67  < > 124* 122* 114*  BUN 84* 45*  < > 38*  < > 44* 37* 32*  CREATININE 9.22* 6.11*  < > 5.17*  < >  4.66* 3.87* 3.31*  CALCIUM 9.2 8.9  < > 9.3  < > 8.4* 8.6* 8.8*  PROT 7.4 7.1  --  7.1  --   --   --   --   ALBUMIN 2.3* 2.3*  < > 2.2*  < > 1.7* 1.7* 1.7*  AST 62* 53*  --  41  --   --   --   --   ALT 24 23  --  22  --   --   --   --   ALKPHOS 126 113  --  102  --   --   --   --   BILITOT 6.8* 6.7*  --  6.8*  --   --   --   --   GFRNONAA 5* 9*  < > 11*  < > 12* 15* 18*  GFRAA 6* 10*  < > 12*  < > 14* 18* 21*  ANIONGAP 14 14  < > 16*  < > '9 6 7  ' < > = values in this interval not displayed.   Hematology  Recent Labs Lab 09/27/16 0259 09/28/16 1359 09/30/16 0413  WBC 9.5 13.3* 14.6*  RBC 4.59 4.93 5.57  HGB 12.2* 13.4 15.2  HCT 36.5* 38.5* 43.3  MCV 79.5 78.1 77.7*  MCH 26.6 27.2 27.3  MCHC 33.4 34.8 35.1  RDW 17.5* 17.8* 18.7*  PLT 83* 127* 125*    Cardiac Enzymes  Recent Labs Lab 09/27/16 2311 09/28/16 0359 09/28/16 1359 09/30/16 0413  TROPONINI 0.11* 0.17* 0.14* 0.12*     Recent Labs Lab 09/30/2016 1132  TROPIPOC 0.28*     BNPNo results for input(s): BNP, PROBNP in the last  168 hours.   DDimer No results for input(s): DDIMER in the last 168 hours.   Radiology    Dg Chest Port 1 View  Result Date: 09/30/2016 CLINICAL DATA:  Pulmonary edema EXAM: PORTABLE CHEST 1 VIEW COMPARISON:  09/29/2016 FINDINGS: Endotracheal tube, NG tube, right jugular central venous catheter are stable. Left jugular dialysis catheter is stable. Right pleural effusion is stable. Heterogeneous opacities throughout both lungs with relative sparing of the left upper lung zone are stable. The heart remains markedly enlarged. IMPRESSION: No active disease. Electronically Signed   By: Marybelle Killings M.D.   On: 09/30/2016 07:07   Dg Chest Port 1 View  Result Date: 09/29/2016 CLINICAL DATA:  ARDS. History of CHF, pulmonary embolism, coronary artery disease, dialysis dependent renal failure. EXAM: PORTABLE CHEST 1 VIEW COMPARISON:  Portable chest x-ray of September 28, 2016. FINDINGS: The lungs are reasonably well expanded. There are patchy interstitial and alveolar opacities bilaterally greatest on the right. The right hemidiaphragm is less well demonstrated today due to a small pleural effusion. The cardiac silhouette remains enlarged. The central pulmonary vascularity is engorged. The endotracheal tube tip projects approximately 4 cm above the carina. The esophagogastric tube tip projects below the inferior margin of the image. The dual-lumen dialysis type catheter tip projects over the proximal SVC. A small caliber right internal jugular venous catheter tip projects at approximately the same level. IMPRESSION: CHF with pulmonary interstitial and alveolar edema greatest on the right. New small to moderate sized right pleural effusion. The support tubes are in reasonable position. Electronically Signed   By: David  Martinique M.D.   On: 09/29/2016 07:15   Dg Chest Portable 1 View  Result Date: 09/28/2016 CLINICAL DATA:  Central line placement. EXAM: PORTABLE CHEST 1 VIEW COMPARISON:  09/27/2016 FINDINGS:  Endotracheal tube terminates  approximately 3.5 cm above the carina. Right jugular catheter is unchanged, with tip overlying the upper SVC. A larger caliber left jugular catheter has been placed and also terminates over the upper SVC. Enteric tube terminates over the proximal stomach. Cardiac silhouette remains enlarged. Perihilar and bibasilar lung opacities on the prior study have mildly improved in the interim. No sizable pleural effusion or pneumothorax is identified. IMPRESSION: 1. Left jugular catheter terminates over the upper SVC. 2. Cardiomegaly with mild improvement of bilateral airspace opacities which may reflect decreased edema. Electronically Signed   By: Logan Bores M.D.   On: 09/28/2016 13:50    Patient Profile     89 YOM with a history of HTN, ESRD on HD (T,TH,Sat), chronic A. Fib, prior history of DVT/PE, GERD, gout, combined systolic and diastolic heart failure, OSA, syncopy, hepatosplenomegaly, CAD status post remote MI in 1990, chronic abdominal pain. With previous AFib he had DCCV 08/29/13 but developed a large hematoma on his back. Has not been on anticoagulation since and has not wanted. Now permanent a fib with severe atrial enlargement. Last cath 11/2015 with minimal CAD.   EF 15-20% on cath.    The patient was admitted with acute respiratory failure with hypoxia (URI) and fluid overload managed by HD. Also paracentesis on 09/19/16. He deteriorated, worsening acute on chronic respiratory failure with increased lactic acid, and intubated yesterday.  PCCM suspicious for pneumonitis and sepsis.    Assessment & Plan     1. Chronic Atrial Fibrillation:  Rate variable control w/ digoxin. At times still elevated.  CHA2DS2VASc score = 2. Pt refuses anticoagulation, per records.  2. Elevated Troponin:  Troponins most likely elevated due to renal failure and sepsis and demand ischemia.  No further CV workup planned  3. CAD: non obstructive CAD by cath March 2017. EF improved from  previous, now up to 40-45%.  4. HCAP: treated by IM on ABX - now on vent  5. Hypotension: in the setting of sepsis. Management per primary team.  6. NICM: He has had low EF since 2015. EF 40-45% on echo this admit, actually improved from previous-  30-35% in 2017. Cath in 2017 showed no obstructive CAD.  7. ESRD: on HD followed by Renal.  Please let us know if we can be of further assistance. No new recs at this time.   Signed, Candee Furbish, MD  09/30/2016, 11:55 AM

## 2016-09-30 NOTE — Progress Notes (Signed)
Pt with sustained SBP's in the 60"s with MAP's in the upper 50's to the low 60's. Aline ordered and obtained by RT. Aline confirmed cuff. Order noted for levophed. Slow advancement of Levophed due to Afib. Slow weaning of neo to ff

## 2016-10-01 ENCOUNTER — Inpatient Hospital Stay (HOSPITAL_COMMUNITY): Payer: Medicare Other

## 2016-10-01 DIAGNOSIS — J90 Pleural effusion, not elsewhere classified: Secondary | ICD-10-CM

## 2016-10-01 LAB — POCT I-STAT, CHEM 8
BUN: 21 mg/dL — ABNORMAL HIGH (ref 6–20)
BUN: 26 mg/dL — ABNORMAL HIGH (ref 6–20)
BUN: 23 mg/dL — ABNORMAL HIGH (ref 6–20)
BUN: 27 mg/dL — ABNORMAL HIGH (ref 6–20)
BUN: 19 mg/dL (ref 6–20)
BUN: 27 mg/dL — AB (ref 6–20)
BUN: 19 mg/dL (ref 6–20)
BUN: 25 mg/dL — ABNORMAL HIGH (ref 6–20)
BUN: 19 mg/dL (ref 6–20)
BUN: 23 mg/dL — AB (ref 6–20)
CALCIUM ION: 0.41 mmol/L — AB (ref 1.15–1.40)
CALCIUM ION: 0.95 mmol/L — AB (ref 1.15–1.40)
CALCIUM ION: 1.08 mmol/L — AB (ref 1.15–1.40)
CALCIUM ION: 1.06 mmol/L — AB (ref 1.15–1.40)
CALCIUM ION: 0.4 mmol/L — AB (ref 1.15–1.40)
CALCIUM ION: 1.08 mmol/L — AB (ref 1.15–1.40)
CHLORIDE: 97 mmol/L — AB (ref 101–111)
CHLORIDE: 99 mmol/L — AB (ref 101–111)
CHLORIDE: 96 mmol/L — AB (ref 101–111)
CHLORIDE: 99 mmol/L — AB (ref 101–111)
CREATININE: 2.4 mg/dL — AB (ref 0.61–1.24)
CREATININE: 1.3 mg/dL — AB (ref 0.61–1.24)
Calcium, Ion: 1.12 mmol/L — ABNORMAL LOW (ref 1.15–1.40)
Calcium, Ion: 0.4 mmol/L — CL (ref 1.15–1.40)
Calcium, Ion: 0.39 mmol/L — CL (ref 1.15–1.40)
Calcium, Ion: 1.07 mmol/L — ABNORMAL LOW (ref 1.15–1.40)
Chloride: 97 mmol/L — ABNORMAL LOW (ref 101–111)
Chloride: 98 mmol/L — ABNORMAL LOW (ref 101–111)
Chloride: 97 mmol/L — ABNORMAL LOW (ref 101–111)
Chloride: 99 mmol/L — ABNORMAL LOW (ref 101–111)
Chloride: 95 mmol/L — ABNORMAL LOW (ref 101–111)
Chloride: 99 mmol/L — ABNORMAL LOW (ref 101–111)
Creatinine, Ser: 1.4 mg/dL — ABNORMAL HIGH (ref 0.61–1.24)
Creatinine, Ser: 2.3 mg/dL — ABNORMAL HIGH (ref 0.61–1.24)
Creatinine, Ser: 1.9 mg/dL — ABNORMAL HIGH (ref 0.61–1.24)
Creatinine, Ser: 2.4 mg/dL — ABNORMAL HIGH (ref 0.61–1.24)
Creatinine, Ser: 1.4 mg/dL — ABNORMAL HIGH (ref 0.61–1.24)
Creatinine, Ser: 2.3 mg/dL — ABNORMAL HIGH (ref 0.61–1.24)
Creatinine, Ser: 1.3 mg/dL — ABNORMAL HIGH (ref 0.61–1.24)
Creatinine, Ser: 2.1 mg/dL — ABNORMAL HIGH (ref 0.61–1.24)
GLUCOSE: 199 mg/dL — AB (ref 65–99)
GLUCOSE: 146 mg/dL — AB (ref 65–99)
GLUCOSE: 184 mg/dL — AB (ref 65–99)
GLUCOSE: 126 mg/dL — AB (ref 65–99)
GLUCOSE: 183 mg/dL — AB (ref 65–99)
GLUCOSE: 187 mg/dL — AB (ref 65–99)
Glucose, Bld: 170 mg/dL — ABNORMAL HIGH (ref 65–99)
Glucose, Bld: 201 mg/dL — ABNORMAL HIGH (ref 65–99)
Glucose, Bld: 143 mg/dL — ABNORMAL HIGH (ref 65–99)
Glucose, Bld: 138 mg/dL — ABNORMAL HIGH (ref 65–99)
HCT: 49 % (ref 39.0–52.0)
HCT: 47 % (ref 39.0–52.0)
HCT: 47 % (ref 39.0–52.0)
HCT: 52 % (ref 39.0–52.0)
HEMATOCRIT: 52 % (ref 39.0–52.0)
HEMATOCRIT: 48 % (ref 39.0–52.0)
HEMATOCRIT: 46 % (ref 39.0–52.0)
HEMATOCRIT: 50 % (ref 39.0–52.0)
HEMATOCRIT: 47 % (ref 39.0–52.0)
HEMATOCRIT: 51 % (ref 39.0–52.0)
HEMOGLOBIN: 17.7 g/dL — AB (ref 13.0–17.0)
HEMOGLOBIN: 16 g/dL (ref 13.0–17.0)
HEMOGLOBIN: 15.6 g/dL (ref 13.0–17.0)
HEMOGLOBIN: 17 g/dL (ref 13.0–17.0)
HEMOGLOBIN: 16 g/dL (ref 13.0–17.0)
HEMOGLOBIN: 17.3 g/dL — AB (ref 13.0–17.0)
Hemoglobin: 16.7 g/dL (ref 13.0–17.0)
Hemoglobin: 16.3 g/dL (ref 13.0–17.0)
Hemoglobin: 16 g/dL (ref 13.0–17.0)
Hemoglobin: 17.7 g/dL — ABNORMAL HIGH (ref 13.0–17.0)
POTASSIUM: 3.8 mmol/L (ref 3.5–5.1)
POTASSIUM: 3.9 mmol/L (ref 3.5–5.1)
POTASSIUM: 4 mmol/L (ref 3.5–5.1)
POTASSIUM: 3.8 mmol/L (ref 3.5–5.1)
POTASSIUM: 3.8 mmol/L (ref 3.5–5.1)
Potassium: 3.8 mmol/L (ref 3.5–5.1)
Potassium: 3.9 mmol/L (ref 3.5–5.1)
Potassium: 3.9 mmol/L (ref 3.5–5.1)
Potassium: 3.7 mmol/L (ref 3.5–5.1)
Potassium: 3.8 mmol/L (ref 3.5–5.1)
SODIUM: 142 mmol/L (ref 135–145)
SODIUM: 139 mmol/L (ref 135–145)
SODIUM: 142 mmol/L (ref 135–145)
SODIUM: 141 mmol/L (ref 135–145)
SODIUM: 139 mmol/L (ref 135–145)
SODIUM: 141 mmol/L (ref 135–145)
Sodium: 140 mmol/L (ref 135–145)
Sodium: 139 mmol/L (ref 135–145)
Sodium: 140 mmol/L (ref 135–145)
Sodium: 140 mmol/L (ref 135–145)
TCO2: 23 mmol/L (ref 0–100)
TCO2: 26 mmol/L (ref 0–100)
TCO2: 26 mmol/L (ref 0–100)
TCO2: 27 mmol/L (ref 0–100)
TCO2: 25 mmol/L (ref 0–100)
TCO2: 27 mmol/L (ref 0–100)
TCO2: 28 mmol/L (ref 0–100)
TCO2: 28 mmol/L (ref 0–100)
TCO2: 26 mmol/L (ref 0–100)
TCO2: 27 mmol/L (ref 0–100)

## 2016-10-01 LAB — RENAL FUNCTION PANEL
ALBUMIN: 1.7 g/dL — AB (ref 3.5–5.0)
ALBUMIN: 1.7 g/dL — AB (ref 3.5–5.0)
ANION GAP: 11 (ref 5–15)
Anion gap: 11 (ref 5–15)
BUN: 27 mg/dL — AB (ref 6–20)
BUN: 24 mg/dL — ABNORMAL HIGH (ref 6–20)
CALCIUM: 8.5 mg/dL — AB (ref 8.9–10.3)
CHLORIDE: 100 mmol/L — AB (ref 101–111)
CO2: 25 mmol/L (ref 22–32)
CO2: 24 mmol/L (ref 22–32)
CREATININE: 2.5 mg/dL — AB (ref 0.61–1.24)
Calcium: 8.6 mg/dL — ABNORMAL LOW (ref 8.9–10.3)
Chloride: 101 mmol/L (ref 101–111)
Creatinine, Ser: 2.25 mg/dL — ABNORMAL HIGH (ref 0.61–1.24)
GFR calc Af Amer: 30 mL/min — ABNORMAL LOW (ref >60)
GFR calc Af Amer: 34 mL/min — ABNORMAL LOW (ref >60)
GFR calc non Af Amer: 29 mL/min — ABNORMAL LOW (ref >60)
GFR, EST NON AFRICAN AMERICAN: 26 mL/min — AB (ref >60)
GLUCOSE: 143 mg/dL — AB (ref 65–99)
GLUCOSE: 146 mg/dL — AB (ref 65–99)
PHOSPHORUS: 4.3 mg/dL (ref 2.5–4.6)
POTASSIUM: 4 mmol/L (ref 3.5–5.1)
Phosphorus: 3.9 mg/dL (ref 2.5–4.6)
Potassium: 3.9 mmol/L (ref 3.5–5.1)
SODIUM: 137 mmol/L (ref 135–145)
Sodium: 135 mmol/L (ref 135–145)

## 2016-10-01 LAB — HEPATIC FUNCTION PANEL
ALT: 21 U/L (ref 17–63)
AST: 42 U/L — ABNORMAL HIGH (ref 15–41)
Albumin: 1.7 g/dL — ABNORMAL LOW (ref 3.5–5.0)
Alkaline Phosphatase: 102 U/L (ref 38–126)
Bilirubin, Direct: 5.8 mg/dL — ABNORMAL HIGH (ref 0.1–0.5)
Indirect Bilirubin: 3 mg/dL — ABNORMAL HIGH (ref 0.3–0.9)
Total Bilirubin: 8.8 mg/dL — ABNORMAL HIGH (ref 0.3–1.2)
Total Protein: 7 g/dL (ref 6.5–8.1)

## 2016-10-01 LAB — GLUCOSE, CAPILLARY
GLUCOSE-CAPILLARY: 110 mg/dL — AB (ref 65–99)
GLUCOSE-CAPILLARY: 116 mg/dL — AB (ref 65–99)
GLUCOSE-CAPILLARY: 105 mg/dL — AB (ref 65–99)
Glucose-Capillary: 115 mg/dL — ABNORMAL HIGH (ref 65–99)
Glucose-Capillary: 115 mg/dL — ABNORMAL HIGH (ref 65–99)
Glucose-Capillary: 151 mg/dL — ABNORMAL HIGH (ref 65–99)

## 2016-10-01 LAB — CBC
HCT: 41.1 % (ref 39.0–52.0)
Hemoglobin: 14.4 g/dL (ref 13.0–17.0)
MCH: 27.2 pg (ref 26.0–34.0)
MCHC: 35 g/dL (ref 30.0–36.0)
MCV: 77.7 fL — ABNORMAL LOW (ref 78.0–100.0)
Platelets: 152 10*3/uL (ref 150–400)
RBC: 5.29 MIL/uL (ref 4.22–5.81)
RDW: 19.1 % — ABNORMAL HIGH (ref 11.5–15.5)
WBC: 14.6 10*3/uL — ABNORMAL HIGH (ref 4.0–10.5)

## 2016-10-01 LAB — MAGNESIUM: MAGNESIUM: 2.2 mg/dL (ref 1.7–2.4)

## 2016-10-01 LAB — APTT: aPTT: 45 seconds — ABNORMAL HIGH (ref 24–36)

## 2016-10-01 MED ORDER — SENNOSIDES 8.8 MG/5ML PO SYRP
5.0000 mL | ORAL_SOLUTION | Freq: Every day | ORAL | Status: DC
  Administered 2016-10-01 – 2016-10-03 (×3): 5 mL via ORAL
  Filled 2016-10-01 (×5): qty 5

## 2016-10-01 MED ORDER — DOCUSATE SODIUM 50 MG/5ML PO LIQD
100.0000 mg | Freq: Two times a day (BID) | ORAL | Status: DC
  Administered 2016-10-01 – 2016-10-05 (×7): 100 mg via ORAL
  Filled 2016-10-01 (×10): qty 10

## 2016-10-01 NOTE — Progress Notes (Signed)
[facility]   Reason for visit: Follow up on pneumonia  Interval History: continues on pressor support, high PEEP, afib, non-responsive  CXR independently reviewed and more effusion right side  Physical Exam: Constitutional:  Vitals:   10/01/16 0915 10/01/16 0930  BP:    Pulse: (!) 126 (!) 109  Resp: (!) 29 (!) 30  Temp:    no respose Eyes: anicteric HENT: +ET Respiratory: coarse breath sounds, decreased on right  Cardiovascular: tachy irr irr GI: soft, nt, nd  Review of Systems: Unable to be assessed due to mental status  Lab Results  Component Value Date   WBC 14.6 (H) 10/01/2016   HGB 16.3 10/01/2016   HCT 48.0 10/01/2016   MCV 77.7 (L) 10/01/2016   PLT 152 10/01/2016    Lab Results  Component Value Date   CREATININE 1.90 (H) 10/01/2016   BUN 23 (H) 10/01/2016   NA 139 10/01/2016   K 3.9 10/01/2016   CL 97 (L) 10/01/2016   CO2 25 10/01/2016    Lab Results  Component Value Date   ALT 21 10/01/2016   AST 42 (H) 10/01/2016   ALKPHOS 102 10/01/2016     Microbiology: Recent Results (from the past 240 hour(s))  Blood Culture (routine x 2)     Status: None   Collection Time: 09/20/2016 11:10 AM  Result Value Ref Range Status   Specimen Description BLOOD RIGHT ARM  Final   Special Requests BOTTLES DRAWN AEROBIC AND ANAEROBIC 5CC  Final   Culture NO GROWTH 5 DAYS  Final   Report Status 09/29/2016 FINAL  Final  Blood Culture (routine x 2)     Status: None   Collection Time: 09/21/2016 11:20 AM  Result Value Ref Range Status   Specimen Description BLOOD RIGHT ANTECUBITAL  Final   Special Requests BOTTLES DRAWN AEROBIC AND ANAEROBIC 5CC  Final   Culture NO GROWTH 5 DAYS  Final   Report Status 09/29/2016 FINAL  Final  Respiratory Panel by PCR     Status: Abnormal   Collection Time: 09/25/16  3:19 PM  Result Value Ref Range Status   Adenovirus NOT DETECTED NOT DETECTED Final   Coronavirus 229E NOT DETECTED NOT DETECTED Final   Coronavirus HKU1 DETECTED (A) NOT DETECTED Final   Coronavirus NL63 NOT DETECTED NOT DETECTED Final   Coronavirus OC43 NOT DETECTED NOT DETECTED Final   Metapneumovirus NOT DETECTED NOT DETECTED Final   Rhinovirus / Enterovirus NOT DETECTED NOT DETECTED Final   Influenza A NOT DETECTED NOT DETECTED Final   Influenza B NOT DETECTED NOT DETECTED Final   Parainfluenza Virus 1 NOT DETECTED NOT DETECTED Final   Parainfluenza Virus 2 NOT DETECTED NOT DETECTED Final   Parainfluenza Virus 3 NOT DETECTED NOT DETECTED Final   Parainfluenza Virus 4 NOT DETECTED NOT DETECTED Final   Respiratory Syncytial Virus NOT DETECTED NOT DETECTED Final   Bordetella pertussis NOT DETECTED NOT DETECTED Final   Chlamydophila pneumoniae NOT DETECTED NOT DETECTED Final   Mycoplasma pneumoniae NOT DETECTED NOT DETECTED Final  Culture, respiratory (NON-Expectorated)     Status: None   Collection Time: 09/26/16  5:09 PM  Result Value Ref Range Status   Specimen Description BRONCHIAL ALVEOLAR LAVAGE  Final   Special Requests RIGHT  Final   Gram Stain   Final    ABUNDANT WBC PRESENT, PREDOMINANTLY PMN FEW SQUAMOUS EPITHELIAL CELLS PRESENT ABUNDANT GRAM POSITIVE COCCI IN PAIRS IN CLUSTERS ABUNDANT GRAM NEGATIVE RODS ABUNDANT GRAM NEGATIVE COCCI  FEW GRAM POSITIVE RODS    Culture MULTIPLE ORGANISMS PRESENT, NONE PREDOMINANT  Final   Report Status 09/28/2016 FINAL  Final  MRSA PCR Screening     Status: None   Collection Time: 09/26/16  6:18 PM  Result Value Ref Range Status   MRSA by PCR NEGATIVE NEGATIVE Final    Comment:        The GeneXpert MRSA Assay (FDA approved for NASAL specimens only), is one component of a comprehensive MRSA colonization surveillance program. It is not intended to diagnose MRSA infection nor to guide or monitor treatment for MRSA infections.     Impression/Plan:  1. Pneumonia - Coronavirus.  No bacteria identified.  Finishing zosyn today and can stop (day 8).   2. Respiratory  failure - likely from #1, also effusion persistent, likely related to #1 vs empyema/evolving process.  ?CT/ultrasound if possible or if thoracentesis possible?  May not be feasible in his current condition.  3.  Renal failure - on CRRT

## 2016-10-01 NOTE — Progress Notes (Signed)
[name] Progress Note   Subjective: no change in pressors, levo still at 20-30 mcg/min.  I/O even, +BM on tube feeds.  CXR no better, maybe worse on R.   Vitals:   10/01/16 1530 10/01/16 1543 10/01/16 1545 10/01/16 1600  BP:  (!) 100/46  (!) 118/57  Pulse:  89 (!) 103   Resp: (!) 28 (!) 30 (!) 29 (!) 29  Temp:  98 F (36.7 C)    TempSrc:  Oral    SpO2:  100% 100%   Weight:      Height:        Inpatient medications: . chlorhexidine gluconate (MEDLINE KIT)  15 mL Mouth Rinse BID  . digoxin  0.0625 mg Per Tube Q48H  . docusate  100 mg Oral BID  . doxercalciferol  3 mcg Intravenous Q M,W,F-HD  . famotidine  20 mg Oral Daily  . feeding supplement (PRO-STAT SUGAR FREE 64)  30 mL Per Tube TID  . heparin subcutaneous  5,000 Units Subcutaneous Q8H  . mouth rinse  15 mL Mouth Rinse QID  . midodrine  10 mg Per Tube TID WC  . piperacillin-tazobactam  3.375 g Intravenous Q6H  . sennosides  5 mL Oral QHS   . sodium chloride Stopped (09/28/16 0700)  . calcium gluconate infusion for CRRT 20 g (10/01/16 1600)  . dextrose 5 % and 0.9% NaCl 50 mL/hr at 10/01/16 0700  . feeding supplement (VITAL 1.5 CAL) Stopped (10/01/16 1211)  . fentaNYL infusion INTRAVENOUS 175 mcg/hr (10/01/16 1600)  . norepinephrine (LEVOPHED) Adult infusion 32 mcg/min (10/01/16 1600)  . dialysate (PRISMASATE) 2,000 mL/hr at 10/01/16 1608  . dialysis replacement fluid (prismasate) 200 mL/hr at 10/01/16 0100  . propofol (DIPRIVAN) infusion 17 mcg/kg/min (10/01/16 1600)  . sodium citrate 2 %/dextrose 2.5% solution 3000 mL 250 mL/hr at 10/01/16 1255   Place/Maintain arterial line **AND** sodium chloride, Place/Maintain arterial line **AND** sodium chloride, albuterol, alteplase, bisacodyl, fentaNYL, heparin, ipratropium-albuterol, sodium chloride  Exam: On vent, sedated, on CRRT No jvd Chest clear post Cor irreg irreg Abd soft ntnd Ext 1+ UE edema  HD: [facility]  MWF 3h 59mn   2/2 bath  89.5kg    Hep 2000   LUA AVF  - Hect 4  - M-225 last 1/11  - pth 942  29% TFS  P 7      Assessment: 1. Acute resp failure/ pulm infiltrates - viral PNA w Coronavirus HKU1, no better 2. 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 Shock - acute/ chron hypotension, remains on pressors; was on midodrine at home  4. Cirrhosis 5. VDRF 6. Volume - up 3-4 kg by wts 7. CAF/ NICM / ^Cleora Fleet- per cards  Plan - changed to citrate AC, keeping even, cont CRRT   RKelly SplinterMD [facility] Spring[facility]id[facility] [name] pager [phone]  10/01/2016, 4:41 PM    Recent Labs Lab 09/30/16 1454  10/01/16 0455  10/01/16 1414 10/01/16 1421 10/01/16 1518  NA 136  < > 137  < > 142 139 135  K 4.1  < > 3.9  < > 3.9 4.0 4.0  CL 104  < > 101  < > 97* 99* 100*  CO2 23  --  25  --   --   --  24  GLUCOSE 172*  < > 143*  < > 184* 143* 146*  BUN 32*  < > 27*  < > 19 27* 24*  CREATININE 2.92*  < > 2.50*  < > 1.30* 2.40*  2.25*  CALCIUM 8.5*  --  8.5*  --   --   --  8.6*  PHOS 3.9  --  3.9  --   --   --  4.3  < > = values in this interval not displayed.  Recent Labs Lab 09/25/16 2128  09/27/16 0259  09/30/16 1454 10/01/16 0455 10/01/16 1518  AST 53*  --  41  --   --  42*  --   ALT 23  --  22  --   --  21  --   ALKPHOS 113  --  102  --   --  102  --   BILITOT 6.7*  --  6.8*  --   --  8.8*  --   PROT 7.1  --  7.1  --   --  7.0  --   ALBUMIN 2.3*  < > 2.2*  < > 1.7* 1.7*  1.7* 1.7*  < > = values in this interval not displayed.  Recent Labs Lab 09/15/2016 1815  09/25/16 2128  09/28/16 1359 09/30/16 0413  10/01/16 0455  10/01/16 1037 10/01/16 1414 10/01/16 1421  WBC 10.1  < > 11.6*  < > 13.3* 14.6*  --  14.6*  --   --   --   --   NEUTROABS 9.1*  --  10.6*  --   --   --   --   --   --   --   --   --   HGB 12.3*  < > 11.7*  < > 13.4 15.2  < > 14.4  < > 16.0 17.7* 16.0  HCT 35.6*  < > 34.4*  < > 38.5* 43.3  < > 41.1  < > 47.0 52.0 47.0  MCV 78.8  < > 78.7  < > 78.1 77.7*  --  77.7*  --   --   --   --   PLT 85*  < > 81*  < > 127* 125*  --  152   --   --   --   --   < > = values in this interval not displayed. Iron/TIBC/Ferritin/ %Sat    Component Value Date/Time   IRON 32 (L) 08/12/2013 1136   IRON 28 (L) 08/12/2013 1136   TIBC 272 08/12/2013 1136   FERRITIN 291.5 08/12/2013 1136   IRONPCTSAT 12 (L) 08/12/2013 1136

## 2016-10-01 NOTE — Progress Notes (Signed)
PULMONARY / CRITICAL CARE MEDICINE   Name: Trevor [name] MRN: [id number] DOB: [birth year]    ADMISSION DATE:  10/06/2016 CONSULTATION DATE:  1/19  REFERRING MD:  Sloan Leiter  CHIEF COMPLAINT:   Progressive acute hypoxic respiratory failure, SIRS/sepsis AND worsening Encephalopathy   brief 66 year old male w/ sig h/o systolic CM (EF 84-69%), AF, (not on AC)  ESRD TTS, and cardiac related cirrhosis. Admitted 1/17 w/ 3 d h/o URI symptoms, temp as high as 103, worsening malaise and progressive dyspnea. The day of admit his wife found him to be disoriented. On arrival to the ER his room air sats were in 80s. His MS and sats improved w/ oxygen. He was admitted w/ working dx of HCAP. Placed on broad spec abx and admitted to the SDU. From 1/17 to 1/19 his confusion actually continued to worsen. His RVP PCR was positive for Coronavirus. He underwent HD X 2 (From time of admit to 1/19). When he returned to the SDU on 1/19 after HD (net -~2.5 L) he was found to be more confused, post HS CXR w/ progression of bilateral R>L infiltrates and O2 requirements up to 5 liters w/ RR in 30s to 40s. PCCM was asked to see given concern that his clinical course had been deteriorating.   STUDIES:  09/17/2016 - admit ECHO 1/19>>>ef 45% RVP PCR 1/18: Coronavirus HKUI BCX2 1/17>>> BAL 1/19 >>> multiple organisms, none predominant    SUBJECTIVE/OVERNIGHT/INTERVAL HX Afebrile overnight. CRRT started. Responds to painful stimuli, but not verbal.    VITAL SIGNS: BP (!) 104/46 (BP Location: Left Leg)   Pulse (!) 117   Temp 98.5 F (36.9 C) (Oral)   Resp (!) 28   Ht _0  (1.854 m)   Wt 206 lb 2.1 oz (93.5 kg)   SpO2 99%   BMI 27.20 kg/m  5 liters  HEMODYNAMICS: CVP:  [4 mmHg-18 mmHg] 18 mmHg  VENTILATOR SETTINGS: Vent Mode: PRVC FiO2 (%):  [50 %-100 %] 60 % Set Rate:  [28 bmp] 28 bmp Vt Set:  [480 mL] 480 mL PEEP:  [5 cmH20-8 cmH20] 8 cmH20 Plateau Pressure:  [20 cmH20-26 cmH20] 24 cmH20  INTAKE /  OUTPUT:  Intake/Output Summary (Last 24 hours) at 10/01/16 0814 Last data filed at 10/01/16 0800  Gross per 24 hour  Intake           4968.6 ml  Output             4540 ml  Net            428.6 ml     General appearance: Adult man, intubated HEENT: [place]/AT, PERRL, sclera icteric, ETT in place, mucus membranes moist Neck: Trachea midline; neck supple, no JVD, left HD cath and RIJ CVL in place Lungs/chest: Coarse breath sounds bilaterally, sync with vent CV: irregularly irregular, tachycardia, w/ 3/6 holosystolic murmur. LUE AVG w/ good bruit and thrill  Abdomen: distended and firm but non-tender; no masses, BS +ve  Extremities: No peripheral edema or extremity lymphadenopathy Skin: warm temperature, turgor and texture nml; old healed papular rash on back, no ulcers or subcutaneous nodules Neuro: rass -2, Open eyes on command. LABS: PULMONARY  Recent Labs Lab 09/18/2016 1201 09/26/16 1753 09/27/16 0342 09/30/16 0315  09/30/16 1811 09/30/16 2010 10/01/16 0425 10/01/16 0429 10/01/16 0722  PHART  --  7.475* 7.431 7.356  --   --   --   --   --   --   PCO2ART  --  40.6  39.9 43.7  --   --   --   --   --   --   PO2ART  --  312.0* 84.0 93.6  --   --   --   --   --   --   HCO3 27.2 29.9* 26.6 24.1  --   --   --   --   --   --   TCO2 _0 --   < > _1 O2SAT 87.0 100.0 97.0 96.6  --   --   --   --   --   --   < > = values in this interval not displayed.  CBC  Recent Labs Lab 09/28/16 1359 09/30/16 0413  10/01/16 0429 10/01/16 0455 10/01/16 0722  HGB 13.4 15.2  < > 17.7* 14.4 16.3  HCT 38.5* 43.3  < > 52.0 41.1 48.0  WBC 13.3* 14.6*  --   --  14.6*  --   PLT 127* 125*  --   --  152  --   < > = values in this interval not displayed.  COAGULATION  Recent Labs Lab 10/08/2016 1815  INR 1.53    CARDIAC    Recent Labs Lab 09/27/16 1700 09/27/16 2311 09/28/16 0359 09/28/16 1359 09/30/16 0413  TROPONINI 0.26* 0.11* 0.17* 0.14* 0.12*   No results for  input(s): PROBNP in the last 168 hours.   CHEMISTRY  Recent Labs Lab 09/28/16 0409  09/28/16 1624 09/29/16 0352 09/29/16 1516 09/30/16 0413  09/30/16 1454  09/30/16 2010 10/01/16 0425 10/01/16 0429 10/01/16 0455 10/01/16 0722  NA  --   < > 137 139 138 136  < > 136  < > 141 140 142 137 139  K  --   < > 4.0 3.8 4.0 4.2  < > 4.1  < > 3.8 3.8 3.8 3.9 3.9  CL  --   < > 103 106 106 106  < > 104  < > 100* 98* 97* 101 97*  CO2  --   < > _2 --  23  --   --   --   --  25  --   GLUCOSE  --   < > 111* 124* 122* 114*  < > 172*  < > 160* 170* 199* 143* 201*  BUN  --   < > 57* 44* 37* 32*  < > 32*  < > 30* 26* 21* 27* 23*  CREATININE  --   < > 6.10* 4.66* 3.87* 3.31*  < > 2.92*  < > 2.60* 2.30* 1.40* 2.50* 1.90*  CALCIUM  --   < > 8.6* 8.4* 8.6* 8.8*  --  8.5*  --   --   --   --  8.5*  --   MG 2.4  --  2.6* 2.4  --  2.4  --   --   --   --   --   --  2.2  --   PHOS 5.5*  --  5.3*  5.5* 4.4 4.1 3.9  --  3.9  --   --   --   --  3.9  --   < > = values in this interval not displayed. Estimated Creatinine Clearance: 45 mL/min (by C-G formula based on SCr of 1.9 mg/dL (H)).   LIVER  Recent Labs Lab 10/07/2016 1815 09/25/16 0407 09/25/16 2128  09/27/16 0259  09/29/16 2025 09/29/16 1516 09/30/16 0413  09/30/16 1454 10/01/16 0455  AST 67* 62* 53*  --  41  --   --   --   --   --  42*  ALT _0 --  22  --   --   --   --   --  21  ALKPHOS 132* 126 113  --  102  --   --   --   --   --  102  BILITOT 6.1* 6.8* 6.7*  --  6.8*  --   --   --   --   --  8.8*  PROT 7.2 7.4 7.1  --  7.1  --   --   --   --   --  7.0  ALBUMIN 2.3* 2.3* 2.3*  < > 2.2*  < > 1.7* 1.7* 1.7* 1.7* 1.7*  1.7*  INR 1.53  --   --   --   --   --   --   --   --   --   --   < > = values in this interval not displayed.   INFECTIOUS  Recent Labs Lab 09/25/16 0708 09/26/16 1237 09/26/16 1629 09/26/16 1822 09/27/16 0259 09/28/16 0359  LATICACIDVEN 2.3* 2.7* 2.1*  --   --   --   PROCALCITON  --   --   --   37.98 35.92 28.96     ENDOCRINE CBG (last 3)   Recent Labs  10/01/16 0019 10/01/16 0412 10/01/16 0808  GLUCAP 115* 110* 116*         IMAGING x48h  - image(s) personally visualized  -   highlighted in bold Dg Chest Port 1 View  Result Date: 10/01/2016 CLINICAL DATA:  Adult respiratory distress syndrome. EXAM: PORTABLE CHEST 1 VIEW COMPARISON:  Radiograph of September 30, 2016. FINDINGS: Stable cardiomediastinal silhouette. Atherosclerosis of thoracic aorta is noted. Endotracheal and nasogastric tubes are unchanged in position. Left internal jugular dialysis catheter is again noted with distal tip in expected position of innominate vein. Right internal jugular catheter is again noted with distal tip in expected position of SVC. No pneumothorax is noted. Stable right pleural effusion with associated atelectasis or infiltrate is noted. Stable left perihilar opacity is noted concerning for atelectasis or infiltrate. Bony thorax is unremarkable. IMPRESSION: Stable support apparatus. Stable large right lung opacity as described above. Stable left perihilar opacity as described above. Electronically Signed   By: Marijo Conception, M.D.   On: 10/01/2016 08:04   Dg Chest Port 1 View  Result Date: 09/30/2016 CLINICAL DATA:  Pulmonary edema EXAM: PORTABLE CHEST 1 VIEW COMPARISON:  09/29/2016 FINDINGS: Endotracheal tube, NG tube, right jugular central venous catheter are stable. Left jugular dialysis catheter is stable. Right pleural effusion is stable. Heterogeneous opacities throughout both lungs with relative sparing of the left upper lung zone are stable. The heart remains markedly enlarged. IMPRESSION: No active disease. Electronically Signed   By: Marybelle Killings M.D.   On: 09/30/2016 07:07    ANTIBIOTICS: vanc 1/17>>1/21 Zosyn 1/17>>> levaquin 1/19>>> 1/21  SIGNIFICANT EVENTS:   LINES/TUBES: ETT 1/19>> RIJ CVL 1/19>> Left HD cath 1/21>> A line 1/22>>  DISCUSSION: 74 yom w/ advanced  systolic CM (EF 16-10%), ESRD, and AF. Admitted 1/17 w/ viral PNA +/- HCAP. Now w/ progressive hypoxia, pulmonary infiltrates and respiratory failure. Looks like evolving ALI w/ persistent SIRS response. Placed on ARDS protocol.  No overnight issues. On levo 32.   ASSESSMENT / PLAN:  PULMONARY A: Acute  hypoxic Respiratory failure in the setting of bilateral pulmonary infiltrates- now stable Presume viral Pneumonitis +/- HCAP  W/ progressive ARDS   P:   ARDS protocol Wean as able PAD protocol  Abx as above F/u BAL- multiple organisms   CARDIOVASCULAR A:  Af w/ RVR SIRS/sepsis  Troponin elevation- Now starting to LYYTKPT(4.65>>6.81) Chronic systolic HF w/ EF 27-51%  P:  Digoxin Q48 hours with ESRD? Tele MAP >60 On Levo.  RENAL A:   ESRD-TTS P:   CRRT  Renal following bmet in AM  GASTROINTESTINAL A:   Hepatic congestion/ w/ hepatonephromegaly w/ presumed cardiac related Cirrhosis (has had prior paracentesis on 1/12) Ascites s/p para on 1/12- no SBP Hyperbilirubinemia- worsening at 8.8 today Nutrition P:   TFs  Pepcid  HEMATOLOGIC A:   Thrombocytopenia- at baseline Leukocytosis- persistent  Remote DVT P:  Trend CBC Transfuse per protocol   INFECTIOUS A:   Viral PNA + Coronavirus HKU1 HCAP Sirs/sepsis  P:   F/u BAL- multiple organisms  F/u HIV- nonreactive  Cont current abx as above   ENDOCRINE A:   Hypoglycemia  P:   Cont D5  Cont cbgs  Avoid SSI unless consistently > 200  NEUROLOGIC A:   Acute encephalopathy in setting of sepsis and cirrhosis RASS -2 P:   RASS goal: -1 to -2 PAD protocol   FAMILY  - Updates: No family at bed side.  - Inter-disciplinary family meet or Palliative Care meeting due by:  1/26   Lorella Nimrod, MD  Internal Medicine Resident, PGY-I Pager: [phone]  10/01/2016 8:14 AM

## 2016-10-02 DIAGNOSIS — K7460 Unspecified cirrhosis of liver: Secondary | ICD-10-CM

## 2016-10-02 LAB — RENAL FUNCTION PANEL
ANION GAP: 11 (ref 5–15)
Albumin: 1.7 g/dL — ABNORMAL LOW (ref 3.5–5.0)
BUN: 18 mg/dL (ref 6–20)
CALCIUM: 9.3 mg/dL (ref 8.9–10.3)
CO2: 29 mmol/L (ref 22–32)
Chloride: 97 mmol/L — ABNORMAL LOW (ref 101–111)
Creatinine, Ser: 2.03 mg/dL — ABNORMAL HIGH (ref 0.61–1.24)
GFR, EST AFRICAN AMERICAN: 38 mL/min — AB (ref >60)
GFR, EST NON AFRICAN AMERICAN: 33 mL/min — AB (ref >60)
Glucose, Bld: 164 mg/dL — ABNORMAL HIGH (ref 65–99)
PHOSPHORUS: 4.7 mg/dL — AB (ref 2.5–4.6)
Potassium: 5 mmol/L (ref 3.5–5.1)
SODIUM: 137 mmol/L (ref 135–145)

## 2016-10-02 LAB — POCT I-STAT, CHEM 8
BUN: 23 mg/dL — AB (ref 6–20)
BUN: 17 mg/dL (ref 6–20)
BUN: 17 mg/dL (ref 6–20)
BUN: 23 mg/dL — AB (ref 6–20)
BUN: 20 mg/dL (ref 6–20)
BUN: 25 mg/dL — ABNORMAL HIGH (ref 6–20)
CALCIUM ION: 1.09 mmol/L — AB (ref 1.15–1.40)
CALCIUM ION: 1.13 mmol/L — AB (ref 1.15–1.40)
CHLORIDE: 95 mmol/L — AB (ref 101–111)
CHLORIDE: 97 mmol/L — AB (ref 101–111)
CHLORIDE: 95 mmol/L — AB (ref 101–111)
CREATININE: 2 mg/dL — AB (ref 0.61–1.24)
Calcium, Ion: 0.4 mmol/L — CL (ref 1.15–1.40)
Calcium, Ion: 0.33 mmol/L — CL (ref 1.15–1.40)
Calcium, Ion: 1.01 mmol/L — ABNORMAL LOW (ref 1.15–1.40)
Calcium, Ion: 0.39 mmol/L — CL (ref 1.15–1.40)
Chloride: 96 mmol/L — ABNORMAL LOW (ref 101–111)
Chloride: 94 mmol/L — ABNORMAL LOW (ref 101–111)
Chloride: 93 mmol/L — ABNORMAL LOW (ref 101–111)
Creatinine, Ser: 1.3 mg/dL — ABNORMAL HIGH (ref 0.61–1.24)
Creatinine, Ser: 1.3 mg/dL — ABNORMAL HIGH (ref 0.61–1.24)
Creatinine, Ser: 1.9 mg/dL — ABNORMAL HIGH (ref 0.61–1.24)
Creatinine, Ser: 1.5 mg/dL — ABNORMAL HIGH (ref 0.61–1.24)
Creatinine, Ser: 2 mg/dL — ABNORMAL HIGH (ref 0.61–1.24)
GLUCOSE: 135 mg/dL — AB (ref 65–99)
GLUCOSE: 226 mg/dL — AB (ref 65–99)
Glucose, Bld: 194 mg/dL — ABNORMAL HIGH (ref 65–99)
Glucose, Bld: 128 mg/dL — ABNORMAL HIGH (ref 65–99)
Glucose, Bld: 179 mg/dL — ABNORMAL HIGH (ref 65–99)
Glucose, Bld: 171 mg/dL — ABNORMAL HIGH (ref 65–99)
HCT: 49 % (ref 39.0–52.0)
HCT: 46 % (ref 39.0–52.0)
HEMATOCRIT: 47 % (ref 39.0–52.0)
HEMATOCRIT: 48 % (ref 39.0–52.0)
HEMATOCRIT: 52 % (ref 39.0–52.0)
HEMATOCRIT: 49 % (ref 39.0–52.0)
HEMOGLOBIN: 15.6 g/dL (ref 13.0–17.0)
Hemoglobin: 16 g/dL (ref 13.0–17.0)
Hemoglobin: 16.7 g/dL (ref 13.0–17.0)
Hemoglobin: 16.3 g/dL (ref 13.0–17.0)
Hemoglobin: 17.7 g/dL — ABNORMAL HIGH (ref 13.0–17.0)
Hemoglobin: 16.7 g/dL (ref 13.0–17.0)
POTASSIUM: 3.8 mmol/L (ref 3.5–5.1)
POTASSIUM: 4.3 mmol/L (ref 3.5–5.1)
Potassium: 3.8 mmol/L (ref 3.5–5.1)
Potassium: 3.8 mmol/L (ref 3.5–5.1)
Potassium: 3.9 mmol/L (ref 3.5–5.1)
Potassium: 4.1 mmol/L (ref 3.5–5.1)
SODIUM: 140 mmol/L (ref 135–145)
SODIUM: 141 mmol/L (ref 135–145)
SODIUM: 138 mmol/L (ref 135–145)
Sodium: 139 mmol/L (ref 135–145)
Sodium: 141 mmol/L (ref 135–145)
Sodium: 141 mmol/L (ref 135–145)
TCO2: 28 mmol/L (ref 0–100)
TCO2: 26 mmol/L (ref 0–100)
TCO2: 26 mmol/L (ref 0–100)
TCO2: 30 mmol/L (ref 0–100)
TCO2: 30 mmol/L (ref 0–100)
TCO2: 30 mmol/L (ref 0–100)

## 2016-10-02 LAB — GLUCOSE, CAPILLARY
GLUCOSE-CAPILLARY: 110 mg/dL — AB (ref 65–99)
GLUCOSE-CAPILLARY: 119 mg/dL — AB (ref 65–99)
GLUCOSE-CAPILLARY: 146 mg/dL — AB (ref 65–99)
Glucose-Capillary: 99 mg/dL (ref 65–99)
Glucose-Capillary: 108 mg/dL — ABNORMAL HIGH (ref 65–99)
Glucose-Capillary: 149 mg/dL — ABNORMAL HIGH (ref 65–99)

## 2016-10-02 LAB — CBC
HCT: 40.6 % (ref 39.0–52.0)
Hemoglobin: 14.2 g/dL (ref 13.0–17.0)
MCH: 27.3 pg (ref 26.0–34.0)
MCHC: 35 g/dL (ref 30.0–36.0)
MCV: 77.9 fL — ABNORMAL LOW (ref 78.0–100.0)
Platelets: 129 10*3/uL — ABNORMAL LOW (ref 150–400)
RBC: 5.21 MIL/uL (ref 4.22–5.81)
RDW: 19.8 % — ABNORMAL HIGH (ref 11.5–15.5)
WBC: 14.5 10*3/uL — ABNORMAL HIGH (ref 4.0–10.5)

## 2016-10-02 LAB — COMPREHENSIVE METABOLIC PANEL
ALT: 24 U/L (ref 17–63)
AST: 48 U/L — AB (ref 15–41)
Albumin: 1.7 g/dL — ABNORMAL LOW (ref 3.5–5.0)
Alkaline Phosphatase: 125 U/L (ref 38–126)
Anion gap: 12 (ref 5–15)
BUN: 21 mg/dL — ABNORMAL HIGH (ref 6–20)
CHLORIDE: 99 mmol/L — AB (ref 101–111)
CO2: 25 mmol/L (ref 22–32)
Calcium: 8.9 mg/dL (ref 8.9–10.3)
Creatinine, Ser: 1.98 mg/dL — ABNORMAL HIGH (ref 0.61–1.24)
GFR, EST AFRICAN AMERICAN: 40 mL/min — AB (ref >60)
GFR, EST NON AFRICAN AMERICAN: 34 mL/min — AB (ref >60)
Glucose, Bld: 133 mg/dL — ABNORMAL HIGH (ref 65–99)
POTASSIUM: 3.9 mmol/L (ref 3.5–5.1)
SODIUM: 136 mmol/L (ref 135–145)
Total Bilirubin: 10 mg/dL — ABNORMAL HIGH (ref 0.3–1.2)
Total Protein: 7.1 g/dL (ref 6.5–8.1)

## 2016-10-02 LAB — PHOSPHORUS: Phosphorus: 4.5 mg/dL (ref 2.5–4.6)

## 2016-10-02 LAB — PROTIME-INR
INR: 1.22
Prothrombin Time: 15.4 seconds — ABNORMAL HIGH (ref 11.4–15.2)

## 2016-10-02 LAB — CALCIUM, IONIZED: CALCIUM, IONIZED, SERUM: 4.4 mg/dL — AB (ref 4.5–5.6)

## 2016-10-02 LAB — APTT: aPTT: 48 s — ABNORMAL HIGH (ref 24–36)

## 2016-10-02 LAB — MAGNESIUM: MAGNESIUM: 2.4 mg/dL (ref 1.7–2.4)

## 2016-10-02 MED ORDER — HYDROCORTISONE NA SUCCINATE PF 100 MG IJ SOLR
50.0000 mg | Freq: Four times a day (QID) | INTRAMUSCULAR | Status: DC
  Administered 2016-10-02 – 2016-10-05 (×13): 50 mg via INTRAVENOUS
  Filled 2016-10-02 (×11): qty 1
  Filled 2016-10-02: qty 2
  Filled 2016-10-02 (×4): qty 1

## 2016-10-02 NOTE — Progress Notes (Addendum)
[facility]   Reason for visit: Follow up on pneumonia  Interval History: continues on pressor support, high PEEP, afib, non-responsive   Physical Exam: Constitutional:  Vitals:   10/02/16 1115 10/02/16 1134  BP:    Pulse:    Resp: (!) 24   Temp:  97.5 F (36.4 C)  no response, sedated Eyes: anicteric HENT: +ET Respiratory: coarse breath sounds, decreased on right  Cardiovascular: tachy irr irr GI: soft, nt, nd  Review of Systems: Unable to be assessed due to mental status  Lab Results  Component Value Date   WBC 14.5 (H) 10/02/2016   HGB 14.2 10/02/2016   HCT 40.6 10/02/2016   MCV 77.9 (L) 10/02/2016   PLT 129 (L) 10/02/2016    Lab Results  Component Value Date   CREATININE 1.30 (H) 10/02/2016   BUN 17 10/02/2016   NA 141 10/02/2016   K 3.8 10/02/2016   CL 94 (L) 10/02/2016   CO2 25 10/02/2016    Lab Results  Component Value Date   ALT 24 10/02/2016   AST 48 (H) 10/02/2016   ALKPHOS 125 10/02/2016     Microbiology: Recent Results (from the past 240 hour(s))  Blood Culture (routine x 2)     Status: None   Collection Time: 09/15/2016 11:10 AM  Result Value Ref Range Status   Specimen Description BLOOD RIGHT ARM  Final   Special Requests BOTTLES DRAWN AEROBIC AND ANAEROBIC 5CC  Final   Culture NO GROWTH 5 DAYS  Final   Report Status 09/29/2016 FINAL  Final  Blood Culture (routine x 2)     Status: None   Collection Time: 09/15/2016 11:20 AM  Result Value Ref Range Status   Specimen Description BLOOD RIGHT ANTECUBITAL  Final   Special Requests BOTTLES DRAWN AEROBIC AND ANAEROBIC 5CC  Final   Culture NO GROWTH 5 DAYS  Final   Report Status 09/29/2016 FINAL  Final  Respiratory Panel by PCR     Status: Abnormal   Collection Time: 09/25/16  3:19 PM  Result Value Ref Range Status   Adenovirus NOT DETECTED NOT DETECTED Final   Coronavirus 229E NOT DETECTED NOT DETECTED Final   Coronavirus HKU1 DETECTED (A) NOT DETECTED Final   Coronavirus NL63 NOT DETECTED NOT DETECTED Final   Coronavirus OC43 NOT DETECTED NOT DETECTED Final   Metapneumovirus NOT DETECTED NOT DETECTED Final   Rhinovirus / Enterovirus NOT DETECTED NOT DETECTED Final   Influenza A NOT DETECTED NOT DETECTED Final   Influenza B NOT DETECTED NOT DETECTED Final   Parainfluenza Virus 1 NOT DETECTED NOT DETECTED Final   Parainfluenza Virus 2 NOT DETECTED NOT DETECTED Final   Parainfluenza Virus 3 NOT DETECTED NOT DETECTED Final   Parainfluenza Virus 4 NOT DETECTED NOT DETECTED Final   Respiratory Syncytial Virus NOT DETECTED NOT DETECTED Final   Bordetella pertussis NOT DETECTED NOT DETECTED Final   Chlamydophila pneumoniae NOT DETECTED NOT DETECTED Final   Mycoplasma pneumoniae NOT DETECTED NOT DETECTED Final  Culture, respiratory (NON-Expectorated)     Status: None   Collection Time: 09/26/16  5:09 PM  Result Value Ref Range Status   Specimen Description BRONCHIAL ALVEOLAR LAVAGE  Final   Special Requests RIGHT  Final   Gram Stain   Final    ABUNDANT WBC PRESENT, PREDOMINANTLY PMN FEW SQUAMOUS EPITHELIAL CELLS PRESENT ABUNDANT GRAM POSITIVE COCCI IN PAIRS IN CLUSTERS ABUNDANT GRAM NEGATIVE RODS ABUNDANT GRAM NEGATIVE COCCI FEW GRAM POSITIVE RODS  Culture MULTIPLE ORGANISMS PRESENT, NONE PREDOMINANT  Final   Report Status 09/28/2016 FINAL  Final  MRSA PCR Screening     Status: None   Collection Time: 09/26/16  6:18 PM  Result Value Ref Range Status   MRSA by PCR NEGATIVE NEGATIVE Final    Comment:        The GeneXpert MRSA Assay (FDA approved for NASAL specimens only), is one component of a comprehensive MRSA colonization surveillance program. It is not intended to diagnose MRSA infection nor to guide or monitor treatment for MRSA infections.     Impression/Plan:  1. Pneumonia - Coronavirus.  No bacteria identified.  Completed zosyn  2. Respiratory failure - likely from #1, remains on vent 3.  Renal failure - on CRRT 4.   Cirrhosis - some ascites noted.    Continuing supportive care per CCM.  I will sign off, please call with any changes, concerns. thanks

## 2016-10-02 NOTE — Progress Notes (Signed)
PULMONARY / CRITICAL CARE MEDICINE   Name: Trevor [name] MRN: [id number] DOB: February 19, 1951    ADMISSION DATE:  09/12/2016 CONSULTATION DATE:  1/19  REFERRING MD:  Sloan Leiter  CHIEF COMPLAINT:   Progressive acute hypoxic respiratory failure, SIRS/sepsis AND worsening Encephalopathy   brief 66 year old male w/ sig h/o systolic CM (EF 17-61%), AF, (not on AC)  ESRD TTS, and cardiac related cirrhosis. Admitted 1/17 w/ 3 d h/o URI symptoms, temp as high as 103, worsening malaise and progressive dyspnea. The day of admit his wife found him to be disoriented. On arrival to the ER his room air sats were in 80s. His MS and sats improved w/ oxygen. He was admitted w/ working dx of HCAP. Placed on broad spec abx and admitted to the SDU. From 1/17 to 1/19 his confusion actually continued to worsen. His RVP PCR was positive for Coronavirus. He underwent HD X 2 (From time of admit to 1/19). When he returned to the SDU on 1/19 after HD (net -~2.5 L) he was found to be more confused, post HS CXR w/ progression of bilateral R>L infiltrates and O2 requirements up to 5 liters w/ RR in 30s to 40s. PCCM was asked to see given concern that his clinical course had been deteriorating.   STUDIES:  09/26/2016 - admit ECHO 1/19>>>ef 45% RVP PCR 1/18: Coronavirus HKUI BCX2 1/17>>> BAL 1/19 >>> multiple organisms, none predominant    SUBJECTIVE/OVERNIGHT/INTERVAL HX Afebrile overnight. CRRT started. Responds to painful stimuli, but not verbal.    VITAL SIGNS: BP (!) 116/58   Pulse (!) 38   Temp 97.4 F (36.3 C) (Axillary)   Resp (!) 29   Ht _0  (1.854 m)   Wt 202 lb 2.6 oz (91.7 kg)   SpO2 (!) 84%   BMI 26.67 kg/m  5 liters  HEMODYNAMICS: CVP:  [8 mmHg-18 mmHg] 9 mmHg  VENTILATOR SETTINGS: Vent Mode: PRVC FiO2 (%):  [60 %] 60 % Set Rate:  [28 bmp] 28 bmp Vt Set:  [480 mL] 480 mL PEEP:  [8 cmH20] 8 cmH20 Plateau Pressure:  [16 cmH20-28 cmH20] 28 cmH20  INTAKE / OUTPUT:  Intake/Output Summary  (Last 24 hours) at 10/02/16 6073 Last data filed at 10/02/16 0700  Gross per 24 hour  Intake             4337 ml  Output             4585 ml  Net             -248 ml     General appearance: Adult man, intubated HEENT: [place]/AT, PERRL, sclera icteric, ETT in place, mucus membranes moist Neck: Trachea midline; neck supple, no JVD, left HD cath and RIJ CVL in place Lungs/chest: Coarse breath sounds With decreased air entry at bases mostly on right. CV: irregularly irregular, tachycardia, w/ 3/6 holosystolic murmur. LUE AVG w/ good bruit and thrill  Abdomen: distended and firm ; no masses, BS +ve  Extremities: No peripheral edema or extremity lymphadenopathy Skin: warm temperature, turgor and texture nml; old healed papular rash on back, no ulcers or subcutaneous nodules Neuro: rass -4, deeply sedated.  LABS: PULMONARY  Recent Labs Lab 09/26/16 1753 09/27/16 0342 09/30/16 0315  10/01/16 1421 10/01/16 1759 10/01/16 1803 10/01/16 2138 10/01/16 2150  PHART 7.475* 7.431 7.356  --   --   --   --   --   --   PCO2ART 40.6 39.9 43.7  --   --   --   --   --   --  PO2ART 312.0* 84.0 93.6  --   --   --   --   --   --   HCO3 29.9* 26.6 24.1  --   --   --   --   --   --   TCO2 31 28  --   < > _0 O2SAT 100.0 97.0 96.6  --   --   --   --   --   --   < > = values in this interval not displayed.  CBC  Recent Labs Lab 09/28/16 1359 09/30/16 0413  10/01/16 0455  10/01/16 1803 10/01/16 2138 10/01/16 2150  HGB 13.4 15.2  < > 14.4  < > 17.0 17.3* 16.0  HCT 38.5* 43.3  < > 41.1  < > 50.0 51.0 47.0  WBC 13.3* 14.6*  --  14.6*  --   --   --   --   PLT 127* 125*  --  152  --   --   --   --   < > = values in this interval not displayed.  COAGULATION No results for input(s): INR in the last 168 hours.  CARDIAC    Recent Labs Lab 09/27/16 1700 09/27/16 2311 09/28/16 0359 09/28/16 1359 09/30/16 0413  TROPONINI 0.26* 0.11* 0.17* 0.14* 0.12*   No results for input(s):  PROBNP in the last 168 hours.   CHEMISTRY  Recent Labs Lab 09/28/16 1624 09/29/16 0352  09/30/16 0413  09/30/16 1454  10/01/16 0455  10/01/16 1518 10/01/16 1759 10/01/16 1803 10/01/16 2138 10/01/16 2150 10/02/16 0428  NA 137 139  < > 136  < > 136  < > 137  < > 135 140 141 141 139 136  K 4.0 3.8  < > 4.2  < > 4.1  < > 3.9  < > 4.0 3.8 3.8 3.8 3.7 3.9  CL 103 106  < > 106  < > 104  < > 101  < > 100* 99* 96* 95* 99* 99*  CO2 23 24  < > 23  --  23  --  25  --  24  --   --   --   --  25  GLUCOSE 111* 124*  < > 114*  < > 172*  < > 143*  < > 146* 126* 183* 187* 138* 133*  BUN 57* 44*  < > 32*  < > 32*  < > 27*  < > 24* 25* 19 19 23* 21*  CREATININE 6.10* 4.66*  < > 3.31*  < > 2.92*  < > 2.50*  < > 2.25* 2.30* 1.40* 1.30* 2.10* 1.98*  CALCIUM 8.6* 8.4*  < > 8.8*  --  8.5*  --  8.5*  --  8.6*  --   --   --   --  8.9  MG 2.6* 2.4  --  2.4  --   --   --  2.2  --   --   --   --   --   --  2.4  PHOS 5.3*  5.5* 4.4  < > 3.9  --  3.9  --  3.9  --  4.3  --   --   --   --  4.5  < > = values in this interval not displayed. Estimated Creatinine Clearance: 43.2 mL/min (by C-G formula based on SCr of 1.98 mg/dL (H)).   LIVER  Recent Labs Lab 09/25/16 2128  09/27/16 0259  09/30/16 0413 09/30/16 1454 10/01/16 0455 10/01/16 1518 10/02/16 0428  AST 53*  --  41  --   --   --  42*  --  48*  ALT 23  --  22  --   --   --  21  --  24  ALKPHOS 113  --  102  --   --   --  102  --  125  BILITOT 6.7*  --  6.8*  --   --   --  8.8*  --  10.0*  PROT 7.1  --  7.1  --   --   --  7.0  --  7.1  ALBUMIN 2.3*  < > 2.2*  < > 1.7* 1.7* 1.7*  1.7* 1.7* 1.7*  < > = values in this interval not displayed.   INFECTIOUS  Recent Labs Lab 09/26/16 1237 09/26/16 1629 09/26/16 1822 09/27/16 0259 09/28/16 0359  LATICACIDVEN 2.7* 2.1*  --   --   --   PROCALCITON  --   --  37.98 35.92 28.96     ENDOCRINE CBG (last 3)   Recent Labs  10/01/16 1541 10/01/16 2013 10/02/16 0006  GLUCAP 115* 105* 110*          IMAGING x48h  - image(s) personally visualized  -   highlighted in bold Dg Chest Port 1 View  Result Date: 10/01/2016 CLINICAL DATA:  Adult respiratory distress syndrome. EXAM: PORTABLE CHEST 1 VIEW COMPARISON:  Radiograph of September 30, 2016. FINDINGS: Stable cardiomediastinal silhouette. Atherosclerosis of thoracic aorta is noted. Endotracheal and nasogastric tubes are unchanged in position. Left internal jugular dialysis catheter is again noted with distal tip in expected position of innominate vein. Right internal jugular catheter is again noted with distal tip in expected position of SVC. No pneumothorax is noted. Stable right pleural effusion with associated atelectasis or infiltrate is noted. Stable left perihilar opacity is noted concerning for atelectasis or infiltrate. Bony thorax is unremarkable. IMPRESSION: Stable support apparatus. Stable large right lung opacity as described above. Stable left perihilar opacity as described above. Electronically Signed   By: Marijo Conception, M.D.   On: 10/01/2016 08:04   US Abdomen Limited Ruq  Result Date: 10/01/2016 CLINICAL DATA:  Acute onset of elevated LFTs. Concern for acalculous cholecystitis. Initial encounter. EXAM: US ABDOMEN LIMITED - RIGHT UPPER QUADRANT COMPARISON:  CT of the abdomen and pelvis performed 12/02/2015, and abdominal ultrasound performed 11/14/2015 FINDINGS: Gallbladder: No stones are seen within the gallbladder. There is mild apparent gallbladder wall thickening. The gallbladder is otherwise unremarkable. No pericholecystic fluid is seen. No ultrasonographic Murphy's sign is elicited, though the patient is on ventilation. Common bile duct: Diameter: 0.6 cm, within normal limits in caliber. Liver: The nodular contour of the liver is compatible with hepatic cirrhosis. There is relatively slow flow noted within the main portal vein. A small 1.2 cm hypodensity is noted in the periphery of the right hepatic lobe, of  uncertain significance. Mild ascites is seen tracking about the liver, and a small to moderate right-sided pleural effusion is also seen. IMPRESSION: 1. Mild apparent gallbladder wall thickening is nonspecific in the presence of ascites. The gallbladder is grossly unremarkable in appearance. 2. Findings of hepatic cirrhosis. Relatively slow flow noted within the main portal vein. 3. Small volume ascites noted tracking about the liver. 4. Small to moderate right-sided pleural effusion noted. 5. Nonspecific 1.2 cm hypodensity in the periphery of the right hepatic lobe. This is relatively stable from March  2017 and is likely benign. Electronically Signed   By: Garald Balding M.D.   On: 10/01/2016 20:35    ANTIBIOTICS: vanc 1/17>>1/21 Zosyn 1/17>>>1/25 levaquin 1/19>>> 1/21  SIGNIFICANT EVENTS:   LINES/TUBES: ETT 1/19>> RIJ CVL 1/19>> Left HD cath 1/21>> A line 1/22>>  DISCUSSION: 60 yom w/ advanced systolic CM (EF 68-15%), ESRD, and AF. Admitted 1/17 w/ viral PNA +/- HCAP. Now w/ progressive hypoxia, pulmonary infiltrates and respiratory failure. Looks like evolving ALI w/ persistent SIRS response. Placed on ARDS protocol.  His condition remained the same, requiring 30 of levo. He becomes hypotensive during awakening trials. Currently deeply sedated. Do not follow commands or open eyes.  ASSESSMENT / PLAN:  PULMONARY A: Acute hypoxic Respiratory failure in the setting of bilateral pulmonary infiltrates- now stable Presume viral Pneumonitis +/- HCAP  W/ progressive ARDS   P:   ARDS protocol Wean as able PAD protocol  Abx as above, will finish zosyn today. F/u BAL- multiple organisms   CARDIOVASCULAR A:  Af w/ RVR SIRS/sepsis  Troponin elevation- Now starting to TELMRAJ(5.18>>3.43) Chronic systolic HF w/ EF 73-57% Hypotension-Still on Levo. 30  P:  Digoxin Q48 hours with ESRD? Tele MAP >60 Continue Levo.  RENAL A:   ESRD-TTS Renal functions- improving  P:   CRRT   Renal following bmet in AM  GASTROINTESTINAL A:   Hepatic congestion/ w/ hepatonephromegaly w/ presumed cardiac related Cirrhosis (has had prior paracentesis on 1/12) Ascites s/p para on 1/12- no SBP Hyperbilirubinemia- worsening at 10.0 today RUQ [place] is negative for any cholecystitis or obstruction. Nutrition P:   TFs  Pepcid Change Propofol to precedex?  HEMATOLOGIC A:   Thrombocytopenia- at baseline Leukocytosis- persistent  Remote DVT P:  Trend CBC Transfuse per protocol   INFECTIOUS A:   Viral PNA + Coronavirus HKU1 HCAP Sirs/sepsis  P:   F/u BAL- multiple organisms  F/u HIV- nonreactive  We will complete his antibiotics today. ID following   ENDOCRINE A:   Hypoglycemia  P:   Cont D5  Cont cbgs  Avoid SSI unless consistently > 200  NEUROLOGIC A:   Acute encephalopathy in setting of sepsis and cirrhosis RASS -4 P:   RASS goal: -1 to -2 PAD protocol   FAMILY  - Updates: No family at bed side.  - Inter-disciplinary family meet or Palliative Care meeting due by:  1/26   Lorella Nimrod, MD  Internal Medicine Resident, PGY-I Pager: [phone]  10/02/2016 7:14 AM

## 2016-10-02 NOTE — Progress Notes (Signed)
[facility] Progress Note   Subjective: no change in pressors, levo still at 20-30 mcg/min.  I/O even.   Vitals:   10/02/16 1145 10/02/16 1200 10/02/16 1215 10/02/16 1242  BP: (!) 99/54 105/87 (!) 93/47 102/80  Pulse:  98  (!) 118  Resp: (!) 28 (!) 29 (!) 23 (!) 30  Temp:      TempSrc:      SpO2:  100%  100%  Weight:      Height:        Inpatient medications: . chlorhexidine gluconate (MEDLINE KIT)  15 mL Mouth Rinse BID  . digoxin  0.0625 mg Per Tube Q48H  . docusate  100 mg Oral BID  . doxercalciferol  3 mcg Intravenous Q M,W,F-HD  . famotidine  20 mg Oral Daily  . feeding supplement (PRO-STAT SUGAR FREE 64)  30 mL Per Tube TID  . heparin subcutaneous  5,000 Units Subcutaneous Q8H  . hydrocortisone sod succinate (SOLU-CORTEF) inj  50 mg Intravenous Q6H  . mouth rinse  15 mL Mouth Rinse QID  . midodrine  10 mg Per Tube TID WC  . sennosides  5 mL Oral QHS   . sodium chloride Stopped (09/28/16 0700)  . calcium gluconate infusion for CRRT 20 g (10/02/16 1200)  . dextrose 5 % and 0.9% NaCl 50 mL/hr at 10/02/16 1158  . feeding supplement (VITAL 1.5 CAL) Stopped (10/01/16 1211)  . fentaNYL infusion INTRAVENOUS 125 mcg/hr (10/02/16 1200)  . norepinephrine (LEVOPHED) Adult infusion 20 mcg/min (10/02/16 1200)  . dialysate (PRISMASATE) 2,000 mL/hr at 10/02/16 1202  . dialysis replacement fluid (prismasate) 200 mL/hr at 10/01/16 0100  . propofol (DIPRIVAN) infusion 5 mcg/kg/min (10/02/16 1100)  . sodium citrate 2 %/dextrose 2.5% solution 3000 mL 250 mL/hr at 10/02/16 1158   Place/Maintain arterial line **AND** sodium chloride, Place/Maintain arterial line **AND** sodium chloride, albuterol, alteplase, bisacodyl, fentaNYL, heparin, ipratropium-albuterol, sodium chloride  Exam: On vent, sedated, on CRRT No jvd Chest clear post Cor irreg irreg Abd soft ntnd Ext 1+ UE edema  HD: [facility]  MWF 3h 60mn   2/2 bath  89.5kg   Hep 2000   LUA AVF  - Hect 4  - M-225  last 1/11  - pth 942  29% TFS  P 7      Assessment: 1. Acute resp failure/ pulm infiltrates - viral PNA w Coronavirus HKU1, no better 2. 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 Shock - acute/ chron hypotension, remains on pressors; was on midodrine at home  4. Cirrhosis 5. VDRF 6. Volume - up 2-3 kg by wts, cvp good 7-10 7. CAF/ NICM / ^Cleora Fleet- per cards  Plan - cont CRRT, citrate LAC/ keeping even   RKelly SplinterMD C[facility] pager [phone]  10/02/2016, 12:53 PM    Recent Labs Lab 10/01/16 0455  10/01/16 1518  10/02/16 0428 10/02/16 0432 10/02/16 1213 10/02/16 1220  NA 137  < > 135  < > 139  136 141 141 140  K 3.9  < > 4.0  < > 3.8  3.9 3.8 3.8 3.9  CL 101  < > 100*  < > 96*  99* 94* 95* 97*  CO2 25  --  24  --  25  --   --   --   GLUCOSE 143*  < > 146*  < > 135*  133* 194* 179* 128*  BUN 27*  < > 24*  < > 23*  21* 17 17 23*  CREATININE  2.50*  < > 2.25*  < > 2.00*  1.98* 1.30* 1.30* 1.90*  CALCIUM 8.5*  --  8.6*  --  8.9  --   --   --   PHOS 3.9  --  4.3  --  4.5  --   --   --   < > = values in this interval not displayed.  Recent Labs Lab 09/27/16 0259  10/01/16 0455 10/01/16 1518 10/02/16 0428  AST 41  --  42*  --  48*  ALT 22  --  21  --  24  ALKPHOS 102  --  102  --  125  BILITOT 6.8*  --  8.8*  --  10.0*  PROT 7.1  --  7.0  --  7.1  ALBUMIN 2.2*  < > 1.7*  1.7* 1.7* 1.7*  < > = values in this interval not displayed.  Recent Labs Lab 09/25/16 2128  09/30/16 0413  10/01/16 0455  10/02/16 0730 10/02/16 1213 10/02/16 1220  WBC 11.6*  < > 14.6*  --  14.6*  --  14.5*  --   --   NEUTROABS 10.6*  --   --   --   --   --   --   --   --   HGB 11.7*  < > 15.2  < > 14.4  < > 14.2 17.7* 16.3  HCT 34.4*  < > 43.3  < > 41.1  < > 40.6 52.0 48.0  MCV 78.7  < > 77.7*  --  77.7*  --  77.9*  --   --   PLT 81*  < > 125*  --  152  --  129*  --   --   < > = values in this interval not displayed. Iron/TIBC/Ferritin/ %Sat    Component Value Date/Time   IRON 32  (L) 08/12/2013 1136   IRON 28 (L) 08/12/2013 1136   TIBC 272 08/12/2013 1136   FERRITIN 291.5 08/12/2013 1136   IRONPCTSAT 12 (L) 08/12/2013 1136

## 2016-10-02 NOTE — Progress Notes (Signed)
Arterial line pulled out with bath .pressure held site level 0. Dr Alvia Grove informed orders not to replace

## 2016-10-03 LAB — GLUCOSE, CAPILLARY
GLUCOSE-CAPILLARY: 150 mg/dL — AB (ref 65–99)
GLUCOSE-CAPILLARY: 137 mg/dL — AB (ref 65–99)
Glucose-Capillary: 134 mg/dL — ABNORMAL HIGH (ref 65–99)
Glucose-Capillary: 139 mg/dL — ABNORMAL HIGH (ref 65–99)
Glucose-Capillary: 169 mg/dL — ABNORMAL HIGH (ref 65–99)
Glucose-Capillary: 189 mg/dL — ABNORMAL HIGH (ref 65–99)

## 2016-10-03 LAB — POCT I-STAT, CHEM 8
BUN: 21 mg/dL — ABNORMAL HIGH (ref 6–20)
BUN: 27 mg/dL — AB (ref 6–20)
BUN: 21 mg/dL — ABNORMAL HIGH (ref 6–20)
BUN: 21 mg/dL — ABNORMAL HIGH (ref 6–20)
BUN: 21 mg/dL — AB (ref 6–20)
BUN: 22 mg/dL — ABNORMAL HIGH (ref 6–20)
BUN: 27 mg/dL — ABNORMAL HIGH (ref 6–20)
BUN: 22 mg/dL — AB (ref 6–20)
BUN: 28 mg/dL — ABNORMAL HIGH (ref 6–20)
CALCIUM ION: 0.4 mmol/L — AB (ref 1.15–1.40)
CALCIUM ION: 0.41 mmol/L — AB (ref 1.15–1.40)
CALCIUM ION: 0.39 mmol/L — AB (ref 1.15–1.40)
CHLORIDE: 93 mmol/L — AB (ref 101–111)
CHLORIDE: 92 mmol/L — AB (ref 101–111)
CHLORIDE: 91 mmol/L — AB (ref 101–111)
CHLORIDE: 93 mmol/L — AB (ref 101–111)
CHLORIDE: 91 mmol/L — AB (ref 101–111)
CREATININE: 1.4 mg/dL — AB (ref 0.61–1.24)
CREATININE: 2 mg/dL — AB (ref 0.61–1.24)
CREATININE: 1.5 mg/dL — AB (ref 0.61–1.24)
Calcium, Ion: 0.4 mmol/L — CL (ref 1.15–1.40)
Calcium, Ion: 1.13 mmol/L — ABNORMAL LOW (ref 1.15–1.40)
Calcium, Ion: 0.41 mmol/L — CL (ref 1.15–1.40)
Calcium, Ion: 1.23 mmol/L (ref 1.15–1.40)
Calcium, Ion: 0.44 mmol/L — CL (ref 1.15–1.40)
Calcium, Ion: 1.27 mmol/L (ref 1.15–1.40)
Chloride: 94 mmol/L — ABNORMAL LOW (ref 101–111)
Chloride: 93 mmol/L — ABNORMAL LOW (ref 101–111)
Chloride: 92 mmol/L — ABNORMAL LOW (ref 101–111)
Chloride: 91 mmol/L — ABNORMAL LOW (ref 101–111)
Creatinine, Ser: 1.4 mg/dL — ABNORMAL HIGH (ref 0.61–1.24)
Creatinine, Ser: 2 mg/dL — ABNORMAL HIGH (ref 0.61–1.24)
Creatinine, Ser: 1.5 mg/dL — ABNORMAL HIGH (ref 0.61–1.24)
Creatinine, Ser: 1.8 mg/dL — ABNORMAL HIGH (ref 0.61–1.24)
Creatinine, Ser: 1.5 mg/dL — ABNORMAL HIGH (ref 0.61–1.24)
Creatinine, Ser: 2 mg/dL — ABNORMAL HIGH (ref 0.61–1.24)
GLUCOSE: 211 mg/dL — AB (ref 65–99)
GLUCOSE: 143 mg/dL — AB (ref 65–99)
Glucose, Bld: 220 mg/dL — ABNORMAL HIGH (ref 65–99)
Glucose, Bld: 165 mg/dL — ABNORMAL HIGH (ref 65–99)
Glucose, Bld: 234 mg/dL — ABNORMAL HIGH (ref 65–99)
Glucose, Bld: 221 mg/dL — ABNORMAL HIGH (ref 65–99)
Glucose, Bld: 154 mg/dL — ABNORMAL HIGH (ref 65–99)
Glucose, Bld: 218 mg/dL — ABNORMAL HIGH (ref 65–99)
Glucose, Bld: 207 mg/dL — ABNORMAL HIGH (ref 65–99)
HCT: 49 % (ref 39.0–52.0)
HEMATOCRIT: 46 % (ref 39.0–52.0)
HEMATOCRIT: 49 % (ref 39.0–52.0)
HEMATOCRIT: 49 % (ref 39.0–52.0)
HEMATOCRIT: 46 % (ref 39.0–52.0)
HEMATOCRIT: 43 % (ref 39.0–52.0)
HEMATOCRIT: 50 % (ref 39.0–52.0)
HEMATOCRIT: 42 % (ref 39.0–52.0)
HEMATOCRIT: 47 % (ref 39.0–52.0)
HEMOGLOBIN: 16.7 g/dL (ref 13.0–17.0)
HEMOGLOBIN: 16.7 g/dL (ref 13.0–17.0)
HEMOGLOBIN: 14.6 g/dL (ref 13.0–17.0)
HEMOGLOBIN: 14.3 g/dL (ref 13.0–17.0)
HEMOGLOBIN: 16 g/dL (ref 13.0–17.0)
Hemoglobin: 16.7 g/dL (ref 13.0–17.0)
Hemoglobin: 15.6 g/dL (ref 13.0–17.0)
Hemoglobin: 15.6 g/dL (ref 13.0–17.0)
Hemoglobin: 17 g/dL (ref 13.0–17.0)
POTASSIUM: 4 mmol/L (ref 3.5–5.1)
POTASSIUM: 3.9 mmol/L (ref 3.5–5.1)
POTASSIUM: 3.7 mmol/L (ref 3.5–5.1)
POTASSIUM: 4 mmol/L (ref 3.5–5.1)
Potassium: 4.1 mmol/L (ref 3.5–5.1)
Potassium: 4.5 mmol/L (ref 3.5–5.1)
Potassium: 3.9 mmol/L (ref 3.5–5.1)
Potassium: 3.9 mmol/L (ref 3.5–5.1)
Potassium: 3.9 mmol/L (ref 3.5–5.1)
SODIUM: 141 mmol/L (ref 135–145)
SODIUM: 139 mmol/L (ref 135–145)
SODIUM: 141 mmol/L (ref 135–145)
SODIUM: 142 mmol/L (ref 135–145)
Sodium: 141 mmol/L (ref 135–145)
Sodium: 139 mmol/L (ref 135–145)
Sodium: 142 mmol/L (ref 135–145)
Sodium: 139 mmol/L (ref 135–145)
Sodium: 142 mmol/L (ref 135–145)
TCO2: 29 mmol/L (ref 0–100)
TCO2: 36 mmol/L (ref 0–100)
TCO2: 29 mmol/L (ref 0–100)
TCO2: 31 mmol/L (ref 0–100)
TCO2: 29 mmol/L (ref 0–100)
TCO2: 28 mmol/L (ref 0–100)
TCO2: 34 mmol/L (ref 0–100)
TCO2: 30 mmol/L (ref 0–100)
TCO2: 35 mmol/L (ref 0–100)

## 2016-10-03 LAB — MAGNESIUM: Magnesium: 2.3 mg/dL (ref 1.7–2.4)

## 2016-10-03 LAB — AMMONIA: Ammonia: 37 umol/L — ABNORMAL HIGH (ref 9–35)

## 2016-10-03 LAB — RENAL FUNCTION PANEL
ANION GAP: 14 (ref 5–15)
Albumin: 1.7 g/dL — ABNORMAL LOW (ref 3.5–5.0)
BUN: 26 mg/dL — AB (ref 6–20)
CHLORIDE: 94 mmol/L — AB (ref 101–111)
CO2: 29 mmol/L (ref 22–32)
Calcium: 11.3 mg/dL — ABNORMAL HIGH (ref 8.9–10.3)
Creatinine, Ser: 2.01 mg/dL — ABNORMAL HIGH (ref 0.61–1.24)
GFR calc Af Amer: 39 mL/min — ABNORMAL LOW (ref >60)
GFR calc non Af Amer: 34 mL/min — ABNORMAL LOW (ref >60)
GLUCOSE: 163 mg/dL — AB (ref 65–99)
POTASSIUM: 4.2 mmol/L (ref 3.5–5.1)
Phosphorus: 5 mg/dL — ABNORMAL HIGH (ref 2.5–4.6)
Sodium: 137 mmol/L (ref 135–145)

## 2016-10-03 LAB — PROTIME-INR
INR: 1.17
Prothrombin Time: 14.9 seconds (ref 11.4–15.2)

## 2016-10-03 LAB — HEPATIC FUNCTION PANEL
ALK PHOS: 125 U/L (ref 38–126)
ALT: 25 U/L (ref 17–63)
AST: 51 U/L — ABNORMAL HIGH (ref 15–41)
Albumin: 1.7 g/dL — ABNORMAL LOW (ref 3.5–5.0)
BILIRUBIN DIRECT: 6.8 mg/dL — AB (ref 0.1–0.5)
BILIRUBIN INDIRECT: 3.1 mg/dL — AB (ref 0.3–0.9)
Total Bilirubin: 9.9 mg/dL — ABNORMAL HIGH (ref 0.3–1.2)
Total Protein: 7.6 g/dL (ref 6.5–8.1)

## 2016-10-03 LAB — APTT: aPTT: 46 seconds — ABNORMAL HIGH (ref 24–36)

## 2016-10-03 LAB — CALCIUM, IONIZED: CALCIUM, IONIZED, SERUM: 4.5 mg/dL (ref 4.5–5.6)

## 2016-10-03 LAB — TRIGLYCERIDES: Triglycerides: 72 mg/dL (ref <150)

## 2016-10-03 MED ORDER — PRO-STAT SUGAR FREE PO LIQD
60.0000 mL | Freq: Four times a day (QID) | ORAL | Status: DC
  Administered 2016-10-03 – 2016-10-05 (×7): 60 mL
  Filled 2016-10-03 (×10): qty 60

## 2016-10-03 MED ORDER — VITAL 1.5 CAL PO LIQD
1000.0000 mL | ORAL | Status: DC
  Administered 2016-10-03 – 2016-10-04 (×2): 1000 mL
  Filled 2016-10-03 (×3): qty 1000

## 2016-10-03 MED ORDER — INSULIN ASPART 100 UNIT/ML ~~LOC~~ SOLN
0.0000 [IU] | SUBCUTANEOUS | Status: DC
  Administered 2016-10-03: 2 [IU] via SUBCUTANEOUS
  Administered 2016-10-03 – 2016-10-04 (×4): 1 [IU] via SUBCUTANEOUS
  Administered 2016-10-04: 2 [IU] via SUBCUTANEOUS
  Administered 2016-10-04: 1 [IU] via SUBCUTANEOUS
  Administered 2016-10-05 (×4): 2 [IU] via SUBCUTANEOUS

## 2016-10-03 MED ORDER — INSULIN ASPART 100 UNIT/ML ~~LOC~~ SOLN: 0.0000 [IU] | Freq: Three times a day (TID) | SUBCUTANEOUS | Status: DC

## 2016-10-03 MED ORDER — INSULIN ASPART 100 UNIT/ML ~~LOC~~ SOLN: 0.0000 [IU] | Freq: Every day | SUBCUTANEOUS | Status: DC

## 2016-10-03 NOTE — Progress Notes (Deleted)
[facility] Progress Note   Subjective: I/O even. Pressors down a bit.    Vitals:   10/03/16 1030 10/03/16 1130 10/03/16 1144 10/03/16 1150  BP: 111/78 (!) 116/45 (!) 116/45   Pulse:   (!) 110   Resp: (!) 28 (!) 28 (!) 29   Temp:    97.7 F (36.5 C)  TempSrc:    Oral  SpO2: 91% 94% 94%   Weight:      Height:        Inpatient medications: . chlorhexidine gluconate (MEDLINE KIT)  15 mL Mouth Rinse BID  . digoxin  0.0625 mg Per Tube Q48H  . docusate  100 mg Oral BID  . doxercalciferol  3 mcg Intravenous Q M,W,F-HD  . famotidine  20 mg Oral Daily  . feeding supplement (PRO-STAT SUGAR FREE 64)  30 mL Per Tube TID  . heparin subcutaneous  5,000 Units Subcutaneous Q8H  . hydrocortisone sod succinate (SOLU-CORTEF) inj  50 mg Intravenous Q6H  . insulin aspart  0-9 Units Subcutaneous Q4H  . mouth rinse  15 mL Mouth Rinse QID  . midodrine  10 mg Per Tube TID WC  . sennosides  5 mL Oral QHS   . sodium chloride 10 mL/hr at 10/03/16 1116  . calcium gluconate infusion for CRRT 20 g (10/03/16 1111)  . feeding supplement (VITAL 1.5 CAL) 1,000 mL (10/03/16 0700)  . fentaNYL infusion INTRAVENOUS 100 mcg/hr (10/03/16 0830)  . norepinephrine (LEVOPHED) Adult infusion 13 mcg/min (10/03/16 1100)  . dialysate (PRISMASATE) 1,700 mL/hr at 10/03/16 1124  . dialysis replacement fluid (prismasate) 200 mL/hr at 10/01/16 0100  . propofol (DIPRIVAN) infusion Stopped (10/03/16 1118)  . sodium citrate 2 %/dextrose 2.5% solution 3000 mL 200 mL/hr at 10/03/16 1128   Place/Maintain arterial line **AND** sodium chloride, Place/Maintain arterial line **AND** sodium chloride, albuterol, alteplase, bisacodyl, fentaNYL, heparin, ipratropium-albuterol, sodium chloride  Exam: On vent, sedated, on CRRT No jvd Chest clear post Cor irreg irreg Abd soft ntnd Ext 1+ UE edema  HD: [facility]  MWF 3h 39mn   2/2 bath  89.5kg   Hep 2000   LUA AVF  - Hect 4  - M-225 last 1/11  - pth 942  29% TFS  P  7      Assessment: 1. Acute resp failure/ pulm infiltrates - viral PNA w Coronavirus HKU1, no better 2. 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 Shock - acute/ chron hypotension, remains on pressors; was on midodrine at home  4. Cirrhosis 5. VDRF 6. Volume - at dry wt, will need some +I/O due to insensible losses over time 7. CAF/ NICM / ^Cleora Fleet- per cards  Plan - cont CRRT, citrate LAC, keep +60 cc/hr   RKelly SplinterMD CShell Knobpager [phone]  10/03/2016, 12:35 PM    Recent Labs Lab 10/01/16 1518  10/02/16 0428  10/02/16 1600  10/03/16 0424 10/03/16 0430 10/03/16 1103  NA 135  < > 139  136  < > 137  < > 141 139 141  K 4.0  < > 3.8  3.9  < > 5.0  < > 4.1 4.5 4.0  CL 100*  < > 96*  99*  < > 97*  < > 93* 94* 92*  CO2 24  --  25  --  29  --   --   --   --   GLUCOSE 146*  < > 135*  133*  < > 164*  < > 220* 165* 234*  BUN 24*  < > 23*  21*  < > 18  < > 21* 27* 21*  CREATININE 2.25*  < > 2.00*  1.98*  < > 2.03*  < > 1.40* 2.00* 1.50*  CALCIUM 8.6*  --  8.9  --  9.3  --   --   --   --   PHOS 4.3  --  4.5  --  4.7*  --   --   --   --   < > = values in this interval not displayed.  Recent Labs Lab 10/01/16 0455  10/02/16 0428 10/02/16 1600 10/03/16 0400  AST 42*  --  48*  --  51*  ALT 21  --  24  --  25  ALKPHOS 102  --  125  --  125  BILITOT 8.8*  --  10.0*  --  9.9*  PROT 7.0  --  7.1  --  7.6  ALBUMIN 1.7*  1.7*  < > 1.7* 1.7* 1.7*  < > = values in this interval not displayed.  Recent Labs Lab 09/30/16 0413  10/01/16 0455  10/02/16 0730  10/03/16 0424 10/03/16 0430 10/03/16 1103  WBC 14.6*  --  14.6*  --  14.5*  --   --   --   --   HGB 15.2  < > 14.4  < > 14.2  < > 16.7 15.6 16.7  HCT 43.3  < > 41.1  < > 40.6  < > 49.0 46.0 49.0  MCV 77.7*  --  77.7*  --  77.9*  --   --   --   --   PLT 125*  --  152  --  129*  --   --   --   --   < > = values in this interval not displayed. Iron/TIBC/Ferritin/ %Sat    Component Value Date/Time   IRON 32 (L)  08/12/2013 1136   IRON 28 (L) 08/12/2013 1136   TIBC 272 08/12/2013 1136   FERRITIN 291.5 08/12/2013 1136   IRONPCTSAT 12 (L) 08/12/2013 1136

## 2016-10-03 NOTE — Progress Notes (Signed)
Nutrition Follow-up  DOCUMENTATION CODES:   Not applicable  INTERVENTION:    To better meet re-estimated needs, change goal rate of Vital 1.5 to 30 ml/h with Prostat 60 ml QID to provide 1880 kcal, 169 gm protein, 550 ml free water daily.  Total intake 2020 kcal, 169 gm protein per day.  NUTRITION DIAGNOSIS:   Inadequate oral intake related to inability to eat as evidenced by NPO status.  Ongoing  GOAL:   Patient will meet greater than or equal to 90% of their needs  Met  MONITOR:   TF tolerance, Vent status, Weight trends, Labs, I & O's  ASSESSMENT:   66 y.o. male with medical history significant for ESRD on [facility] (Dr. Mercy Moore), Afib, CHF, brought today after waking up aroung 930 with initial confusion (GCS 14), disoriented. Last known normal 4 am. He was noted to have developed productive phlegm over the last 3 days, shortness of breath, intermittent fever up top 103, and generalized myalgia. He denies any chest pain or palpitations but he does endorse tachycardia.  Discussed patient in ICU rounds and with RN today. Patient remains on CRRT, anuric. Tolerating TF well. Patient is currently receiving Vital 1.5 via OGT at 50 ml/h (1200 ml/day) with Prostat 30 ml TID to provide 2100 kcals, 126 gm protein, 917 ml free water daily.  Patient is currently intubated on ventilator support MV: 13 L/min Temp (24hrs), Avg:96.9 F (36.1 C), Min:94 F (34.4 C), Max:98 F (36.7 C)  Propofol: 5.3 ml/hr providing 140 kcal per day. Total intake = 2240 kcal, 126 gm protein Labs and medications reviewed.  Diet Order:   NPO  Skin:  Reviewed, no issues  Last BM:  1/26  Height:   Ht Readings from Last 1 Encounters:  09/25/2016 '6\' 1"'  (1.854 m)    Weight:   Wt Readings from Last 1 Encounters:  10/03/16 197 lb 12 oz (89.7 kg)    Ideal Body Weight:  83.63 kg  BMI:  Body mass index is 26.09 kg/m.  Estimated Nutritional Needs:   Kcal:  2050  Protein:  160-180  gm  Fluid:  >/= 2 L  EDUCATION NEEDS:   No education needs identified at this time  Molli Barrows, [place], [place], [place] Pager [phone] After Hours Pager [phone]

## 2016-10-03 NOTE — Progress Notes (Signed)
[facility] Progress Note   Subjective: I/O even. Pressors down a bit.    Vitals:   10/03/16 1330 10/03/16 1400 10/03/16 1500 10/03/16 1530  BP: (!) 104/46 123/69 (!) 133/48 115/71  Pulse:      Resp: (!) 28 (!) 28 (!) 28 (!) 28  Temp:      TempSrc:      SpO2: 94% 96% 97% 97%  Weight:      Height:        Inpatient medications: . chlorhexidine gluconate (MEDLINE KIT)  15 mL Mouth Rinse BID  . digoxin  0.0625 mg Per Tube Q48H  . docusate  100 mg Oral BID  . doxercalciferol  3 mcg Intravenous Q M,W,F-HD  . famotidine  20 mg Oral Daily  . feeding supplement (PRO-STAT SUGAR FREE 64)  60 mL Per Tube QID  . feeding supplement (VITAL 1.5 CAL)  1,000 mL Per Tube Q24H  . heparin subcutaneous  5,000 Units Subcutaneous Q8H  . hydrocortisone sod succinate (SOLU-CORTEF) inj  50 mg Intravenous Q6H  . insulin aspart  0-9 Units Subcutaneous Q4H  . mouth rinse  15 mL Mouth Rinse QID  . midodrine  10 mg Per Tube TID WC  . sennosides  5 mL Oral QHS   . sodium chloride 10 mL/hr at 10/03/16 1200  . calcium gluconate infusion for CRRT 20 g (10/03/16 1556)  . fentaNYL infusion INTRAVENOUS 175 mcg/hr (10/03/16 1300)  . norepinephrine (LEVOPHED) Adult infusion 8 mcg/min (10/03/16 1500)  . dialysate (PRISMASATE) 1,700 mL/hr at 10/03/16 1433  . dialysis replacement fluid (prismasate) 200 mL/hr at 10/01/16 0100  . propofol (DIPRIVAN) infusion 20 mcg/kg/min (10/03/16 1325)  . sodium citrate 2 %/dextrose 2.5% solution 3000 mL 270 mL/hr at 10/03/16 1329   Place/Maintain arterial line **AND** sodium chloride, Place/Maintain arterial line **AND** sodium chloride, albuterol, alteplase, bisacodyl, fentaNYL, heparin, ipratropium-albuterol, sodium chloride  Exam: On vent, sedated, on CRRT No jvd Chest clear post Cor irreg irreg Abd soft ntnd Ext 1+ UE edema  HD: [facility]  MWF 3h 35mn   2/2 bath  89.5kg   Hep 2000   LUA AVF  - Hect 4  - M-225 last 1/11  - pth 942  29% TFS  P 7       Assessment: 1. Acute resp failure/ pulm infiltrates - viral PNA w Coronavirus HKU1, no better 2. 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 Shock - acute/ chron hypotension, remains on pressors; was on midodrine at home  4. Cirrhosis 5. VDRF 6. Volume - at dry wt, will need some +I/O due to insensible losses over time 7. CAF/ NICM / ^Cleora Fleet- per cards  Plan - cont CRRT, citrate LAC, keep +60 cc/hr   RKelly SplinterMD CMarltonpager [phone]  10/03/2016, 4:36 PM    Recent Labs Lab 10/01/16 1518  10/02/16 0428  10/02/16 1600  10/03/16 1103 10/03/16 1322 10/03/16 1550  NA 135  < > 139  136  < > 137  < > 141 142 141  K 4.0  < > 3.8  3.9  < > 5.0  < > 4.0 3.9 3.9  CL 100*  < > 96*  99*  < > 97*  < > 92* 91* 93*  CO2 24  --  25  --  29  --   --   --   --   GLUCOSE 146*  < > 135*  133*  < > 164*  < > 234* 221* 211*  BUN 24*  < > 23*  21*  < > 18  < > 21* 21* 21*  CREATININE 2.25*  < > 2.00*  1.98*  < > 2.03*  < > 1.50* 1.80* 1.40*  CALCIUM 8.6*  --  8.9  --  9.3  --   --   --   --   PHOS 4.3  --  4.5  --  4.7*  --   --   --   --   < > = values in this interval not displayed.  Recent Labs Lab 10/01/16 0455  10/02/16 0428 10/02/16 1600 10/03/16 0400  AST 42*  --  48*  --  51*  ALT 21  --  24  --  25  ALKPHOS 102  --  125  --  125  BILITOT 8.8*  --  10.0*  --  9.9*  PROT 7.0  --  7.1  --  7.6  ALBUMIN 1.7*  1.7*  < > 1.7* 1.7* 1.7*  < > = values in this interval not displayed.  Recent Labs Lab 09/30/16 0413  10/01/16 0455  10/02/16 0730  10/03/16 1103 10/03/16 1322 10/03/16 1550  WBC 14.6*  --  14.6*  --  14.5*  --   --   --   --   HGB 15.2  < > 14.4  < > 14.2  < > 16.7 16.7 15.6  HCT 43.3  < > 41.1  < > 40.6  < > 49.0 49.0 46.0  MCV 77.7*  --  77.7*  --  77.9*  --   --   --   --   PLT 125*  --  152  --  129*  --   --   --   --   < > = values in this interval not displayed. Iron/TIBC/Ferritin/ %Sat    Component Value Date/Time   IRON 32 (L) 08/12/2013  1136   IRON 28 (L) 08/12/2013 1136   TIBC 272 08/12/2013 1136   FERRITIN 291.5 08/12/2013 1136   IRONPCTSAT 12 (L) 08/12/2013 1136

## 2016-10-03 NOTE — Progress Notes (Addendum)
PULMONARY / CRITICAL CARE MEDICINE   Name: Trevor [name] MRN: [id number] DOB: 29-Apr-1951    ADMISSION DATE:  10/02/2016 CONSULTATION DATE:  1/19  REFERRING MD:  Sloan Leiter  CHIEF COMPLAINT:   Progressive acute hypoxic respiratory failure, SIRS/sepsis AND worsening Encephalopathy   brief 66 year old male w/ sig h/o systolic CM (EF 54-56%), AF, (not on AC)  ESRD TTS, and cardiac related cirrhosis. Admitted 1/17 w/ 3 d h/o URI symptoms, temp as high as 103, worsening malaise and progressive dyspnea. The day of admit his wife found him to be disoriented. On arrival to the ER his room air sats were in 80s. His MS and sats improved w/ oxygen. He was admitted w/ working dx of HCAP. Placed on broad spec abx and admitted to the SDU. From 1/17 to 1/19 his confusion actually continued to worsen. His RVP PCR was positive for Coronavirus. He underwent HD X 2 (From time of admit to 1/19). When he returned to the SDU on 1/19 after HD (net -~2.5 L) he was found to be more confused, post HS CXR w/ progression of bilateral R>L infiltrates and O2 requirements up to 5 liters w/ RR in 30s to 40s. PCCM was asked to see given concern that his clinical course had been deteriorating.   STUDIES:  09/30/2016 - admit ECHO 1/19>>>ef 45% RVP PCR 1/18: Coronavirus HKUI BCX2 1/17>>> BAL 1/19 >>> multiple organisms, none predominant    SUBJECTIVE/OVERNIGHT/INTERVAL HX Afebrile overnight. CRRT started. Responds to painful stimuli, but not verbal.    VITAL SIGNS: BP 106/75   Pulse (!) 113   Temp 97.5 F (36.4 C) (Oral)   Resp (!) 28   Ht _0  (1.854 m)   Wt 197 lb 12 oz (89.7 kg)   SpO2 93%   BMI 26.09 kg/m  5 liters  HEMODYNAMICS: CVP:  [5 mmHg-17 mmHg] 6 mmHg  VENTILATOR SETTINGS: Vent Mode: PRVC FiO2 (%):  [50 %-60 %] 50 % Set Rate:  [28 bmp] 28 bmp Vt Set:  [480 mL] 480 mL PEEP:  [8 cmH20] 8 cmH20 Plateau Pressure:  [22 cmH20-30 cmH20] 22 cmH20  INTAKE / OUTPUT:  Intake/Output Summary (Last  24 hours) at 10/03/16 2563 Last data filed at 10/03/16 0700  Gross per 24 hour  Intake          4664.86 ml  Output             5051 ml  Net          -386.14 ml     General appearance: Adult man, intubated HEENT: [place]/AT, PERRL, sclera icteric, ETT in place, mucus membranes moist Neck: Trachea midline; neck supple, no JVD, left HD cath and RIJ CVL in place Lungs/chest: Coarse breath sounds With scattered rhonchi bilaterally. CV: irregularly irregular, tachycardia, w/ 3/6 holosystolic murmur. LUE AVG w/ good bruit and thrill  Abdomen: distended and firm ; no masses, BS +ve  Extremities: No peripheral edema or extremity lymphadenopathy Skin: warm temperature, turgor and texture nml; old healed papular rash on back, no ulcers or subcutaneous nodules Neuro: rass -4, deeply sedated.  LABS: PULMONARY  Recent Labs Lab 09/26/16 1753 09/27/16 0342 09/30/16 0315  10/02/16 1220 10/02/16 2008 10/02/16 2017 10/03/16 0424 10/03/16 0430  PHART 7.475* 7.431 7.356  --   --   --   --   --   --   PCO2ART 40.6 39.9 43.7  --   --   --   --   --   --  PO2ART 312.0* 84.0 93.6  --   --   --   --   --   --   HCO3 29.9* 26.6 24.1  --   --   --   --   --   --   TCO2 31 28  --   < > _0 36  O2SAT 100.0 97.0 96.6  --   --   --   --   --   --   < > = values in this interval not displayed.  CBC  Recent Labs Lab 09/30/16 0413  10/01/16 0455  10/02/16 0730  10/02/16 2017 10/03/16 0424 10/03/16 0430  HGB 15.2  < > 14.4  < > 14.2  < > 15.6 16.7 15.6  HCT 43.3  < > 41.1  < > 40.6  < > 46.0 49.0 46.0  WBC 14.6*  --  14.6*  --  14.5*  --   --   --   --   PLT 125*  --  152  --  129*  --   --   --   --   < > = values in this interval not displayed.  COAGULATION  Recent Labs Lab 10/02/16 0428 10/03/16 0400  INR 1.22 1.17    CARDIAC    Recent Labs Lab 09/27/16 1700 09/27/16 2311 09/28/16 0359 09/28/16 1359 09/30/16 0413  TROPONINI 0.26* 0.11* 0.17* 0.14* 0.12*   No results  for input(s): PROBNP in the last 168 hours.   CHEMISTRY  Recent Labs Lab 09/29/16 0352  09/30/16 0413  09/30/16 1454  10/01/16 0455  10/01/16 1518  10/02/16 0428  10/02/16 1600 10/02/16 2008 10/02/16 2017 10/03/16 0400 10/03/16 0424 10/03/16 0430  NA 139  < > 136  < > 136  < > 137  < > 135  < > 139  136  < > 137 141 138  --  141 139  K 3.8  < > 4.2  < > 4.1  < > 3.9  < > 4.0  < > 3.8  3.9  < > 5.0 4.1 4.3  --  4.1 4.5  CL 106  < > 106  < > 104  < > 101  < > 100*  < > 96*  99*  < > 97* 93* 95*  --  93* 94*  CO2 24  < > 23  --  23  --  25  --  24  --  25  --  29  --   --   --   --   --   GLUCOSE 124*  < > 114*  < > 172*  < > 143*  < > 146*  < > 135*  133*  < > 164* 226* 171*  --  220* 165*  BUN 44*  < > 32*  < > 32*  < > 27*  < > 24*  < > 23*  21*  < > 18 20 25*  --  21* 27*  CREATININE 4.66*  < > 3.31*  < > 2.92*  < > 2.50*  < > 2.25*  < > 2.00*  1.98*  < > 2.03* 1.50* 2.00*  --  1.40* 2.00*  CALCIUM 8.4*  < > 8.8*  --  8.5*  --  8.5*  --  8.6*  --  8.9  --  9.3  --   --   --   --   --   MG 2.4  --  2.4  --   --   --  2.2  --   --   --  2.4  --   --   --   --  2.3  --   --   PHOS 4.4  < > 3.9  --  3.9  --  3.9  --  4.3  --  4.5  --  4.7*  --   --   --   --   --   < > = values in this interval not displayed. Estimated Creatinine Clearance: 42.7 mL/min (by C-G formula based on SCr of 2 mg/dL (H)).   LIVER  Recent Labs Lab 09/27/16 0259  10/01/16 0455 10/01/16 1518 10/02/16 0428 10/02/16 1600 10/03/16 0400  AST 41  --  42*  --  48*  --  51*  ALT 22  --  21  --  24  --  25  ALKPHOS 102  --  102  --  125  --  125  BILITOT 6.8*  --  8.8*  --  10.0*  --  9.9*  PROT 7.1  --  7.0  --  7.1  --  7.6  ALBUMIN 2.2*  < > 1.7*  1.7* 1.7* 1.7* 1.7* 1.7*  INR  --   --   --   --  1.22  --  1.17  < > = values in this interval not displayed.   INFECTIOUS  Recent Labs Lab 09/26/16 1237 09/26/16 1629 09/26/16 1822 09/27/16 0259 09/28/16 0359  LATICACIDVEN 2.7* 2.1*  --    --   --   PROCALCITON  --   --  37.98 35.92 28.96     ENDOCRINE CBG (last 3)   Recent Labs  10/02/16 2003 10/02/16 2315 10/03/16 0332  GLUCAP 146* 149* 139*         IMAGING x48h  - image(s) personally visualized  -   highlighted in bold US Abdomen Limited Ruq  Result Date: 10/01/2016 CLINICAL DATA:  Acute onset of elevated LFTs. Concern for acalculous cholecystitis. Initial encounter. EXAM: US ABDOMEN LIMITED - RIGHT UPPER QUADRANT COMPARISON:  CT of the abdomen and pelvis performed 12/02/2015, and abdominal ultrasound performed 11/14/2015 FINDINGS: Gallbladder: No stones are seen within the gallbladder. There is mild apparent gallbladder wall thickening. The gallbladder is otherwise unremarkable. No pericholecystic fluid is seen. No ultrasonographic Murphy's sign is elicited, though the patient is on ventilation. Common bile duct: Diameter: 0.6 cm, within normal limits in caliber. Liver: The nodular contour of the liver is compatible with hepatic cirrhosis. There is relatively slow flow noted within the main portal vein. A small 1.2 cm hypodensity is noted in the periphery of the right hepatic lobe, of uncertain significance. Mild ascites is seen tracking about the liver, and a small to moderate right-sided pleural effusion is also seen. IMPRESSION: 1. Mild apparent gallbladder wall thickening is nonspecific in the presence of ascites. The gallbladder is grossly unremarkable in appearance. 2. Findings of hepatic cirrhosis. Relatively slow flow noted within the main portal vein. 3. Small volume ascites noted tracking about the liver. 4. Small to moderate right-sided pleural effusion noted. 5. Nonspecific 1.2 cm hypodensity in the periphery of the right hepatic lobe. This is relatively stable from March 2017 and is likely benign. Electronically Signed   By: Garald Balding M.D.   On: 10/01/2016 20:35    ANTIBIOTICS: vanc 1/17>>1/21 Zosyn 1/17>>>1/25 levaquin 1/19>>> 1/21  SIGNIFICANT  EVENTS:   LINES/TUBES: ETT 1/19>> RIJ CVL 1/19>> Left  HD cath 1/21>> A line 1/22>>  DISCUSSION: 39 yom w/ advanced systolic CM (EF 16-10%), ESRD, and AF. Admitted 1/17 w/ viral PNA +/- HCAP. Now w/ progressive hypoxia, pulmonary infiltrates and respiratory failure. Looks like evolving ALI w/ persistent SIRS response. Placed on ARDS protocol.  His condition remained the same, requiring 15 of levo. His levo requirement slightly decreased after adding stress steroids. Currently deeply sedated. Do not follow commands or open eyes. . Bilirubin remained stable at 9.9 today.  ASSESSMENT / PLAN:  PULMONARY A: Acute hypoxic Respiratory failure in the setting of bilateral pulmonary infiltrates- now stable Presume viral Pneumonitis +/- HCAP  W/ progressive ARDS   P:   ARDS protocol Wean as able-he might need trach. PAD protocol  Abx -completed the course F/u BAL- multiple organisms   CARDIOVASCULAR A:  Af w/ RVR SIRS/sepsis  Troponin elevation- Now starting to RUEAVWU(9.81>>1.91) Chronic systolic HF w/ EF 47-82% Hypotension-Still on Levo. 15  P:  Digoxin Q48 hours with ESRD? Tele MAP >60 Continue Levo.  RENAL A:   ESRD-TTS Renal functions- improving  P:   CRRT  Renal following bmet in AM  GASTROINTESTINAL A:   Hepatic congestion/ w/ hepatonephromegaly w/ presumed cardiac related Cirrhosis (has had prior paracentesis on 1/12) Ascites s/p para on 1/12- no SBP Hyperbilirubinemia- Stable at 9.9 today, and ammonia at 37 mildly decreased from before, PT/INR within normal limits and APTT mildly elevated. RUQ [place] is negative for any cholecystitis or obstruction. Nutrition P:   TFs  Pepcid  HEMATOLOGIC A:   Thrombocytopenia- at baseline Leukocytosis- persistent  Remote DVT P:  Trend CBC Transfuse per protocol   INFECTIOUS A:   Viral PNA + Coronavirus HKU1 HCAP Sirs/sepsis  P:   F/u BAL- multiple organisms  F/u HIV- nonreactive  Completed all  antibiotics. ID following   ENDOCRINE A:   Hypoglycemia  P:   Cont D5  Cont cbgs  Avoid SSI unless consistently > 200  NEUROLOGIC A:   Acute encephalopathy in setting of sepsis and cirrhosis RASS -4 P:   RASS goal: -1 to -2 PAD protocol   FAMILY  - Updates: No family at bed side.  - Inter-disciplinary family meet or Palliative Care meeting due by:  1/26   Lorella Nimrod, MD  Internal Medicine Resident, PGY-I Pager: [phone]  10/03/2016 8:07 AM   Attending note: I have seen and examined the patient with nurse practitioner/resident and agree with the note. History, labs and imaging reviewed.  66 year old with cardiomyopathy, atrial fibrillation, end-stage renal disease, cirrhosis admitted with acute respiratory failure secondary to coronavirus infection, pneumonia, ARDS. Continues on levo, still requiring high PEEP.  Blood pressure (!) 116/38, pulse (!) 113, temperature 97.5 F (36.4 C), temperature source Oral, resp. rate (!) 28, height _0  (1.854 m), weight 89.7 kg (197 lb 12 oz), SpO2 94 %. Gen:      No acute distress HEENT:  EOMI, sclera anicteric Neck:     No masses; no thyromegaly Lungs:    Clear to auscultation bilaterally; normal respiratory effort CV:         Regular rate and rhythm; no murmurs Abd:      + bowel sounds; soft, non-tender; no palpable masses, no distension Ext:    No edema; adequate peripheral perfusion Skin:      Warm and dry; no rash Neuro: alert and oriented x 3 Psych: normal mood and affect  Assessment/Plan: Acute resp failure from coronavirus infection, ARDS Septic shock AKI requiring CRRT Elevated LFTs  -  Wean of levo as tolerated - Continue CRRT - wean PEEP and FiO2 - LFTs are stable  Wife updated at bedside 1/26. He may be headed for a trach if there is no improvement soon.  Marshell Garfinkel MD [facility] Pager [phone] If no answer or after 3pm call: [phone] 10/03/2016, 10:43 AM

## 2016-10-04 DIAGNOSIS — Z992 Dependence on renal dialysis: Secondary | ICD-10-CM | POA: Diagnosis not present

## 2016-10-04 DIAGNOSIS — N186 End stage renal disease: Secondary | ICD-10-CM | POA: Diagnosis not present

## 2016-10-04 DIAGNOSIS — I10 Essential (primary) hypertension: Secondary | ICD-10-CM | POA: Diagnosis not present

## 2016-10-04 LAB — HEPATIC FUNCTION PANEL
ALT: 32 U/L (ref 17–63)
AST: 58 U/L — AB (ref 15–41)
Albumin: 1.5 g/dL — ABNORMAL LOW (ref 3.5–5.0)
Alkaline Phosphatase: 135 U/L — ABNORMAL HIGH (ref 38–126)
BILIRUBIN INDIRECT: 3 mg/dL — AB (ref 0.3–0.9)
Bilirubin, Direct: 6.1 mg/dL — ABNORMAL HIGH (ref 0.1–0.5)
TOTAL PROTEIN: 6.6 g/dL (ref 6.5–8.1)
Total Bilirubin: 9.1 mg/dL — ABNORMAL HIGH (ref 0.3–1.2)

## 2016-10-04 LAB — POCT I-STAT, CHEM 8
BUN: 11 mg/dL (ref 6–20)
BUN: 21 mg/dL — ABNORMAL HIGH (ref 6–20)
BUN: 23 mg/dL — ABNORMAL HIGH (ref 6–20)
BUN: 24 mg/dL — AB (ref 6–20)
BUN: 25 mg/dL — ABNORMAL HIGH (ref 6–20)
BUN: 26 mg/dL — AB (ref 6–20)
BUN: 29 mg/dL — ABNORMAL HIGH (ref 6–20)
BUN: 29 mg/dL — ABNORMAL HIGH (ref 6–20)
BUN: 30 mg/dL — AB (ref 6–20)
BUN: 31 mg/dL — AB (ref 6–20)
BUN: 31 mg/dL — ABNORMAL HIGH (ref 6–20)
BUN: 31 mg/dL — ABNORMAL HIGH (ref 6–20)
BUN: 39 mg/dL — ABNORMAL HIGH (ref 6–20)
BUN: 43 mg/dL — AB (ref 6–20)
CALCIUM ION: 0.39 mmol/L — AB (ref 1.15–1.40)
CALCIUM ION: 0.45 mmol/L — AB (ref 1.15–1.40)
CALCIUM ION: 0.56 mmol/L — AB (ref 1.15–1.40)
CALCIUM ION: 0.64 mmol/L — AB (ref 1.15–1.40)
CALCIUM ION: 1.2 mmol/L (ref 1.15–1.40)
CHLORIDE: 91 mmol/L — AB (ref 101–111)
CHLORIDE: 91 mmol/L — AB (ref 101–111)
CHLORIDE: 92 mmol/L — AB (ref 101–111)
CHLORIDE: 92 mmol/L — AB (ref 101–111)
CHLORIDE: 92 mmol/L — AB (ref 101–111)
CHLORIDE: 92 mmol/L — AB (ref 101–111)
CREATININE: 2.2 mg/dL — AB (ref 0.61–1.24)
CREATININE: 1.6 mg/dL — AB (ref 0.61–1.24)
CREATININE: 2.1 mg/dL — AB (ref 0.61–1.24)
Calcium, Ion: 0.38 mmol/L — CL (ref 1.15–1.40)
Calcium, Ion: 0.47 mmol/L — CL (ref 1.15–1.40)
Calcium, Ion: 0.51 mmol/L — CL (ref 1.15–1.40)
Calcium, Ion: 0.62 mmol/L — CL (ref 1.15–1.40)
Calcium, Ion: 1.27 mmol/L (ref 1.15–1.40)
Calcium, Ion: 1.27 mmol/L (ref 1.15–1.40)
Calcium, Ion: 1.29 mmol/L (ref 1.15–1.40)
Calcium, Ion: 1.33 mmol/L (ref 1.15–1.40)
Calcium, Ion: 1.26 mmol/L (ref 1.15–1.40)
Chloride: 91 mmol/L — ABNORMAL LOW (ref 101–111)
Chloride: 87 mmol/L — ABNORMAL LOW (ref 101–111)
Chloride: 92 mmol/L — ABNORMAL LOW (ref 101–111)
Chloride: 92 mmol/L — ABNORMAL LOW (ref 101–111)
Chloride: 93 mmol/L — ABNORMAL LOW (ref 101–111)
Chloride: 90 mmol/L — ABNORMAL LOW (ref 101–111)
Chloride: 94 mmol/L — ABNORMAL LOW (ref 101–111)
Chloride: 92 mmol/L — ABNORMAL LOW (ref 101–111)
Creatinine, Ser: 1.4 mg/dL — ABNORMAL HIGH (ref 0.61–1.24)
Creatinine, Ser: 1.5 mg/dL — ABNORMAL HIGH (ref 0.61–1.24)
Creatinine, Ser: 1.5 mg/dL — ABNORMAL HIGH (ref 0.61–1.24)
Creatinine, Ser: 1.6 mg/dL — ABNORMAL HIGH (ref 0.61–1.24)
Creatinine, Ser: 1.6 mg/dL — ABNORMAL HIGH (ref 0.61–1.24)
Creatinine, Ser: 1.7 mg/dL — ABNORMAL HIGH (ref 0.61–1.24)
Creatinine, Ser: 2 mg/dL — ABNORMAL HIGH (ref 0.61–1.24)
Creatinine, Ser: 2.1 mg/dL — ABNORMAL HIGH (ref 0.61–1.24)
Creatinine, Ser: 2.1 mg/dL — ABNORMAL HIGH (ref 0.61–1.24)
Creatinine, Ser: 2.1 mg/dL — ABNORMAL HIGH (ref 0.61–1.24)
Creatinine, Ser: 1.5 mg/dL — ABNORMAL HIGH (ref 0.61–1.24)
GLUCOSE: 169 mg/dL — AB (ref 65–99)
GLUCOSE: 151 mg/dL — AB (ref 65–99)
GLUCOSE: 208 mg/dL — AB (ref 65–99)
Glucose, Bld: 125 mg/dL — ABNORMAL HIGH (ref 65–99)
Glucose, Bld: 131 mg/dL — ABNORMAL HIGH (ref 65–99)
Glucose, Bld: 134 mg/dL — ABNORMAL HIGH (ref 65–99)
Glucose, Bld: 139 mg/dL — ABNORMAL HIGH (ref 65–99)
Glucose, Bld: 164 mg/dL — ABNORMAL HIGH (ref 65–99)
Glucose, Bld: 166 mg/dL — ABNORMAL HIGH (ref 65–99)
Glucose, Bld: 174 mg/dL — ABNORMAL HIGH (ref 65–99)
Glucose, Bld: 182 mg/dL — ABNORMAL HIGH (ref 65–99)
Glucose, Bld: 199 mg/dL — ABNORMAL HIGH (ref 65–99)
Glucose, Bld: 211 mg/dL — ABNORMAL HIGH (ref 65–99)
Glucose, Bld: 212 mg/dL — ABNORMAL HIGH (ref 65–99)
HCT: 47 % (ref 39.0–52.0)
HCT: 43 % (ref 39.0–52.0)
HCT: 44 % (ref 39.0–52.0)
HEMATOCRIT: 42 % (ref 39.0–52.0)
HEMATOCRIT: 43 % (ref 39.0–52.0)
HEMATOCRIT: 44 % (ref 39.0–52.0)
HEMATOCRIT: 44 % (ref 39.0–52.0)
HEMATOCRIT: 45 % (ref 39.0–52.0)
HEMATOCRIT: 46 % (ref 39.0–52.0)
HEMATOCRIT: 46 % (ref 39.0–52.0)
HEMATOCRIT: 46 % (ref 39.0–52.0)
HEMATOCRIT: 47 % (ref 39.0–52.0)
HEMATOCRIT: 47 % (ref 39.0–52.0)
HEMATOCRIT: 48 % (ref 39.0–52.0)
HEMOGLOBIN: 16 g/dL (ref 13.0–17.0)
HEMOGLOBIN: 14.6 g/dL (ref 13.0–17.0)
HEMOGLOBIN: 15.6 g/dL (ref 13.0–17.0)
HEMOGLOBIN: 15.3 g/dL (ref 13.0–17.0)
HEMOGLOBIN: 15 g/dL (ref 13.0–17.0)
HEMOGLOBIN: 15 g/dL (ref 13.0–17.0)
HEMOGLOBIN: 16 g/dL (ref 13.0–17.0)
HEMOGLOBIN: 15.6 g/dL (ref 13.0–17.0)
Hemoglobin: 14.3 g/dL (ref 13.0–17.0)
Hemoglobin: 14.6 g/dL (ref 13.0–17.0)
Hemoglobin: 15 g/dL (ref 13.0–17.0)
Hemoglobin: 15.6 g/dL (ref 13.0–17.0)
Hemoglobin: 16 g/dL (ref 13.0–17.0)
Hemoglobin: 16.3 g/dL (ref 13.0–17.0)
POTASSIUM: 3.8 mmol/L (ref 3.5–5.1)
POTASSIUM: 3.9 mmol/L (ref 3.5–5.1)
POTASSIUM: 3.9 mmol/L (ref 3.5–5.1)
POTASSIUM: 3.9 mmol/L (ref 3.5–5.1)
POTASSIUM: 4 mmol/L (ref 3.5–5.1)
POTASSIUM: 4.1 mmol/L (ref 3.5–5.1)
POTASSIUM: 4.3 mmol/L (ref 3.5–5.1)
POTASSIUM: 4.5 mmol/L (ref 3.5–5.1)
Potassium: 4 mmol/L (ref 3.5–5.1)
Potassium: 4.2 mmol/L (ref 3.5–5.1)
Potassium: 4.2 mmol/L (ref 3.5–5.1)
Potassium: 3.9 mmol/L (ref 3.5–5.1)
Potassium: 3.7 mmol/L (ref 3.5–5.1)
Potassium: 4.5 mmol/L (ref 3.5–5.1)
SODIUM: 137 mmol/L (ref 135–145)
SODIUM: 138 mmol/L (ref 135–145)
SODIUM: 139 mmol/L (ref 135–145)
SODIUM: 141 mmol/L (ref 135–145)
SODIUM: 141 mmol/L (ref 135–145)
SODIUM: 141 mmol/L (ref 135–145)
SODIUM: 141 mmol/L (ref 135–145)
Sodium: 139 mmol/L (ref 135–145)
Sodium: 141 mmol/L (ref 135–145)
Sodium: 142 mmol/L (ref 135–145)
Sodium: 140 mmol/L (ref 135–145)
Sodium: 140 mmol/L (ref 135–145)
Sodium: 138 mmol/L (ref 135–145)
Sodium: 141 mmol/L (ref 135–145)
TCO2: 29 mmol/L (ref 0–100)
TCO2: 29 mmol/L (ref 0–100)
TCO2: 30 mmol/L (ref 0–100)
TCO2: 30 mmol/L (ref 0–100)
TCO2: 31 mmol/L (ref 0–100)
TCO2: 31 mmol/L (ref 0–100)
TCO2: 31 mmol/L (ref 0–100)
TCO2: 34 mmol/L (ref 0–100)
TCO2: 34 mmol/L (ref 0–100)
TCO2: 35 mmol/L (ref 0–100)
TCO2: 36 mmol/L (ref 0–100)
TCO2: 36 mmol/L (ref 0–100)
TCO2: 38 mmol/L (ref 0–100)
TCO2: 39 mmol/L (ref 0–100)

## 2016-10-04 LAB — GLUCOSE, CAPILLARY
GLUCOSE-CAPILLARY: 109 mg/dL — AB (ref 65–99)
GLUCOSE-CAPILLARY: 133 mg/dL — AB (ref 65–99)
GLUCOSE-CAPILLARY: 150 mg/dL — AB (ref 65–99)
Glucose-Capillary: 153 mg/dL — ABNORMAL HIGH (ref 65–99)
Glucose-Capillary: 119 mg/dL — ABNORMAL HIGH (ref 65–99)

## 2016-10-04 LAB — MAGNESIUM: MAGNESIUM: 2.3 mg/dL (ref 1.7–2.4)

## 2016-10-04 LAB — CBC
HCT: 37.2 % — ABNORMAL LOW (ref 39.0–52.0)
Hemoglobin: 13 g/dL (ref 13.0–17.0)
MCH: 26.9 pg (ref 26.0–34.0)
MCHC: 34.9 g/dL (ref 30.0–36.0)
MCV: 76.9 fL — ABNORMAL LOW (ref 78.0–100.0)
Platelets: 176 10*3/uL (ref 150–400)
RBC: 4.84 MIL/uL (ref 4.22–5.81)
RDW: 20.2 % — AB (ref 11.5–15.5)
WBC: 14.8 10*3/uL — AB (ref 4.0–10.5)

## 2016-10-04 LAB — TRIGLYCERIDES: TRIGLYCERIDES: 67 mg/dL (ref <150)

## 2016-10-04 LAB — RENAL FUNCTION PANEL
ALBUMIN: 1.7 g/dL — AB (ref 3.5–5.0)
Anion gap: 16 — ABNORMAL HIGH (ref 5–15)
BUN: 33 mg/dL — ABNORMAL HIGH (ref 6–20)
CALCIUM: 11.5 mg/dL — AB (ref 8.9–10.3)
CO2: 30 mmol/L (ref 22–32)
CREATININE: 2.24 mg/dL — AB (ref 0.61–1.24)
Chloride: 92 mmol/L — ABNORMAL LOW (ref 101–111)
GFR calc Af Amer: 34 mL/min — ABNORMAL LOW (ref >60)
GFR, EST NON AFRICAN AMERICAN: 29 mL/min — AB (ref >60)
GLUCOSE: 114 mg/dL — AB (ref 65–99)
PHOSPHORUS: 4.8 mg/dL — AB (ref 2.5–4.6)
POTASSIUM: 4.2 mmol/L (ref 3.5–5.1)
SODIUM: 138 mmol/L (ref 135–145)

## 2016-10-04 LAB — APTT: APTT: 39 s — AB (ref 24–36)

## 2016-10-04 LAB — PROTIME-INR
INR: 1.19
Prothrombin Time: 15.2 seconds (ref 11.4–15.2)

## 2016-10-04 NOTE — Progress Notes (Signed)
PULMONARY / CRITICAL CARE MEDICINE   Name: Trevor [name] MRN: [id number] DOB: [birth year]    ADMISSION DATE:  09/23/2016 CONSULTATION DATE:  1/19  REFERRING MD:  Sloan Leiter  CHIEF COMPLAINT:   Progressive acute hypoxic respiratory failure, SIRS/sepsis AND worsening Encephalopathy   brief 66 year old male w/ sig h/o systolic CM (EF 35-59%), AF, (not on AC)  ESRD TTS, and cardiac related cirrhosis. Admitted 1/17 w/ 3 d h/o URI symptoms, temp as high as 103, worsening malaise and progressive dyspnea. The day of admit his wife found him to be disoriented. On arrival to the ER his room air sats were in 80s. His MS and sats improved w/ oxygen. He was admitted w/ working dx of HCAP. Placed on broad spec abx and admitted to the SDU. From 1/17 to 1/19 his confusion actually continued to worsen. His RVP PCR was positive for Coronavirus. He underwent HD X 2 (From time of admit to 1/19). When he returned to the SDU on 1/19 after HD (net -~2.5 L) he was found to be more confused, post HS CXR w/ progression of bilateral R>L infiltrates and O2 requirements up to 5 liters w/ RR in 30s to 40s. PCCM was asked to see given concern that his clinical course had been deteriorating.   STUDIES:  09/11/2016 - admit ECHO 1/19>>>ef 45% RVP PCR 1/18: Coronavirus HKUI BCX2 1/17>>> BAL 1/19 >>> multiple organisms, none predominant    SUBJECTIVE/OVERNIGHT/INTERVAL HX No major changes in clinical situation. Still on 50%, PEEP and CRRT  VITAL SIGNS: BP (!) 100/40   Pulse 81   Temp 98.6 F (37 C) (Oral)   Resp (!) 28   Ht _0  (1.854 m)   Wt 203 lb 14.8 oz (92.5 kg)   SpO2 100%   BMI 26.90 kg/m  5 liters   HEMODYNAMICS:   VENTILATOR SETTINGS: Vent Mode: PRVC FiO2 (%):  [50 %] 50 % Set Rate:  [28 bmp] 28 bmp Vt Set:  [480 mL] 480 mL PEEP:  [8 cmH20] 8 cmH20 Plateau Pressure:  [20 cmH20-26 cmH20] 20 cmH20  INTAKE / OUTPUT:  Intake/Output Summary (Last 24 hours) at 10/04/16 0859 Last data filed at  10/04/16 0700  Gross per 24 hour  Intake           4465.8 ml  Output             2986 ml  Net           1479.8 ml   ON examination: Blood pressure (!) 108/40, pulse 100, temperature 98.6 F (37 C), temperature source Oral, resp. rate (!) 28, height _1  (1.854 m), weight 203 lb 14.8 oz (92.5 kg), SpO2 99 %. Gen:      No acute distress HEENT:  EOMI, sclera anicteric Neck:     No masses; no thyromegaly Lungs:    Clear to auscultation bilaterally; normal respiratory effort CV:         Regular rate and rhythm; no murmurs Abd:      + bowel sounds; soft, non-tender; no palpable masses, no distension Ext:    No edema; adequate peripheral perfusion Skin:      Warm and dry; no rash Neuro: Sedated, unresponsive Psych: normal mood and affect  LABS: PULMONARY  Recent Labs Lab 09/30/16 0315  10/03/16 1550 10/03/16 1842 10/03/16 1851 10/03/16 2017 10/03/16 2022  PHART 7.356  --   --   --   --   --   --   PCO2ART  43.7  --   --   --   --   --   --   PO2ART 93.6  --   --   --   --   --   --   HCO3 24.1  --   --   --   --   --   --   TCO2  --   < > 29 28 34 30 35  O2SAT 96.6  --   --   --   --   --   --   < > = values in this interval not displayed.  CBC  Recent Labs Lab 10/01/16 0455  10/02/16 0730  10/03/16 2017 10/03/16 2022 10/04/16 0419  HGB 14.4  < > 14.2  < > 16.0 14.3 13.0  HCT 41.1  < > 40.6  < > 47.0 42.0 37.2*  WBC 14.6*  --  14.5*  --   --   --  14.8*  PLT 152  --  129*  --   --   --  176  < > = values in this interval not displayed.  COAGULATION  Recent Labs Lab 10/02/16 0428 10/03/16 0400 10/04/16 0419  INR 1.22 1.17 1.19    CARDIAC    Recent Labs Lab 09/27/16 1700 09/27/16 2311 09/28/16 0359 09/28/16 1359 09/30/16 0413  TROPONINI 0.26* 0.11* 0.17* 0.14* 0.12*   No results for input(s): PROBNP in the last 168 hours.   CHEMISTRY  Recent Labs Lab 09/30/16 0413  10/01/16 0455  10/01/16 1518  10/02/16 0428  10/02/16 1600  10/03/16 0400   10/03/16 1705 10/03/16 1842 10/03/16 1851 10/03/16 2017 10/03/16 2022 10/04/16 0419  NA 136  < > 137  < > 135  < > 139  136  < > 137  < >  --   < > 137 142 139 142 139  --   K 4.2  < > 3.9  < > 4.0  < > 3.8  3.9  < > 5.0  < >  --   < > 4.2 3.9 3.9 3.7 4.0  --   CL 106  < > 101  < > 100*  < > 96*  99*  < > 97*  < >  --   < > 94* 92* 93* 91* 91*  --   CO2 23  < > 25  --  24  --  25  --  29  --   --   --  29  --   --   --   --   --   GLUCOSE 114*  < > 143*  < > 146*  < > 135*  133*  < > 164*  < >  --   < > 163* 218* 154* 207* 143*  --   BUN 32*  < > 27*  < > 24*  < > 23*  21*  < > 18  < >  --   < > 26* 22* 27* 22* 28*  --   CREATININE 3.31*  < > 2.50*  < > 2.25*  < > 2.00*  1.98*  < > 2.03*  < >  --   < > 2.01* 1.50* 2.00* 1.50* 2.00*  --   CALCIUM 8.8*  < > 8.5*  --  8.6*  --  8.9  --  9.3  --   --   --  11.3*  --   --   --   --   --  MG 2.4  --  2.2  --   --   --  2.4  --   --   --  2.3  --   --   --   --   --   --  2.3  PHOS 3.9  < > 3.9  --  4.3  --  4.5  --  4.7*  --   --   --  5.0*  --   --   --   --   --   < > = values in this interval not displayed. Estimated Creatinine Clearance: 42.7 mL/min (by C-G formula based on SCr of 2 mg/dL (H)).   LIVER  Recent Labs Lab 10/01/16 0455  10/02/16 0428 10/02/16 1600 10/03/16 0400 10/03/16 1705 10/04/16 0419  AST 42*  --  48*  --  51*  --  58*  ALT 21  --  24  --  25  --  32  ALKPHOS 102  --  125  --  125  --  135*  BILITOT 8.8*  --  10.0*  --  9.9*  --  9.1*  PROT 7.0  --  7.1  --  7.6  --  6.6  ALBUMIN 1.7*  1.7*  < > 1.7* 1.7* 1.7* 1.7* 1.5*  INR  --   --  1.22  --  1.17  --  1.19  < > = values in this interval not displayed.   INFECTIOUS  Recent Labs Lab 09/28/16 0359  PROCALCITON 28.96     ENDOCRINE CBG (last 3)   Recent Labs  10/03/16 2339 10/04/16 0407 10/04/16 0807  GLUCAP 134* 109* 133*    IMAGING x48h  - image(s) personally visualized  -   highlighted in bold No results  found.  ANTIBIOTICS: Vanc 1/17>>1/21 Zosyn 1/17>>>1/25 Levaquin 1/19>>> 1/21  SIGNIFICANT EVENTS:  LINES/TUBES: ETT 1/19>> RIJ CVL 1/19>> Left HD cath 1/21>> A line 1/22>>  DISCUSSION: 66 yom w/ advanced systolic CM (EF 54-09%), ESRD, and AF. Admitted 1/17 w/ viral PNA +/- HCAP. Now w/ progressive hypoxia, pulmonary infiltrates and respiratory failure. ARDS secondary to coronavirus infection.   His condition remained the same, requiring 15 of levo. His levo requirement slightly decreased after adding stress steroids. Currently deeply sedated. Do not follow commands or open eyes. . Bilirubin remained stable at 9.9 today.  ASSESSMENT / PLAN:  PULMONARY A: Acute hypoxic Respiratory failure in the setting of bilateral pulmonary infiltrates- now stable Presume viral Pneumonitis +/- HCAP  W/ progressive ARDS  P:   Continue ARDS protocol If no improvement soon he might be need a trach Finished antibiotics.  CARDIOVASCULAR A:  Af w/ RVR SIRS/sepsis  Troponin elevation- Now starting to [id number](8.29>>5.62) Chronic systolic HF w/ EF 13-08% Hypotension-Still on Levo. 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 RUQ [place] is negative for any cholecystitis or obstruction. Nutrition P:   Follow LFTs Continue tube feeds Pepcid for ulcer prophylaxis  HEMATOLOGIC A:   Thrombocytopenia- at baseline Leukocytosis- persistent  Remote DVT P:  Trend CBC Transfuse per protocol   INFECTIOUS A:  Viral PNA + Coronavirus HKU1 HCAP Sirs/sepsis  P:   Completed all  antibiotics ID signed off. Continue supportive care  ENDOCRINE A:   Hypoglycemia  P:   Continue CBGs  NEUROLOGIC A:   Acute encephalopathy in setting of sepsis and cirrhosis RASS -4 P:   RASS goal: -1 to -2 PAD protocol   FAMILY  - Updates: No family at bed side. Family updated 1/26 - Inter-disciplinary family meet or Palliative Care meeting due by:  1/26  Critical care time- 40 mins.  Marshell Garfinkel MD [facility] Pager [phone] If no answer or after 3pm call: [phone] 10/04/2016, 9:13 AM

## 2016-10-04 NOTE — Progress Notes (Signed)
[facility] Progress Note   Subjective: levo gtt down to 12 ug/min. I/O + 1500. Wt 92.5 kg.    Vitals:   10/04/16 0630 10/04/16 0645 10/04/16 0700 10/04/16 0801  BP: 93/73 (!) 107/43 (!) 100/40   Pulse:      Resp: (!) 24 (!) 28 (!) 28   Temp:    98.6 F (37 C)  TempSrc:    Oral  SpO2: 97% 100% 100%   Weight:      Height:        Inpatient medications: . chlorhexidine gluconate (MEDLINE KIT)  15 mL Mouth Rinse BID  . digoxin  0.0625 mg Per Tube Q48H  . docusate  100 mg Oral BID  . doxercalciferol  3 mcg Intravenous Q M,W,F-HD  . famotidine  20 mg Oral Daily  . feeding supplement (PRO-STAT SUGAR FREE 64)  60 mL Per Tube QID  . feeding supplement (VITAL 1.5 CAL)  1,000 mL Per Tube Q24H  . heparin subcutaneous  5,000 Units Subcutaneous Q8H  . hydrocortisone sod succinate (SOLU-CORTEF) inj  50 mg Intravenous Q6H  . insulin aspart  0-9 Units Subcutaneous Q4H  . mouth rinse  15 mL Mouth Rinse QID  . midodrine  10 mg Per Tube TID WC  . sennosides  5 mL Oral QHS   . sodium chloride 10 mL/hr at 10/03/16 1200  . calcium gluconate infusion for CRRT 20 g (10/04/16 0700)  . fentaNYL infusion INTRAVENOUS 175 mcg/hr (10/04/16 0700)  . norepinephrine (LEVOPHED) Adult infusion 12.053 mcg/min (10/04/16 0700)  . dialysate (PRISMASATE) 1,700 mL/hr at 10/04/16 0410  . dialysis replacement fluid (prismasate) 200 mL/hr at 10/04/16 0310  . propofol (DIPRIVAN) infusion 15.048 mcg/kg/min (10/04/16 0700)  . sodium citrate 2 %/dextrose 2.5% solution 3000 mL 250 mL/hr at 10/03/16 2236   Place/Maintain arterial line **AND** sodium chloride, Place/Maintain arterial line **AND** sodium chloride, albuterol, alteplase, bisacodyl, fentaNYL, heparin, ipratropium-albuterol, sodium chloride  Exam: On vent, sedated, on CRRT ETT/ OG tube in place w tube feeds running No jvd Chest clear ant/lat Cor irreg irreg Abd soft ntnd no ascites No joint effusion Ext 1+ UE edema, no LE edema  HD:  [facility]  MWF 3h 98mn   2/2 bath  89.5kg   Hep 2000   LUA AVF  - Hect 4  - M-225 last 1/11  - pth 942  29% TFS  P 7      Assessment: 1. Acute resp failure/ pulm infiltrates - viral PNA w Coronavirus HKU1, no better 2. ESRD on CRRT day #7 3. Shock - acute/ chron hypotension, remains on pressors; was on midodrine at home  4. Cirrhosis 5. VDRF 6. Nutrition - on tube feeds 7. Volume - up 2-3kg 8. CAF/ NICM / ^Trevor [name]- per cards  Plan - cont CRRT, citrate LAC, keep +40 cc/hr   RKelly SplinterMD CRichlandpager [phone]  10/04/2016, 8:33 AM    Recent Labs Lab 10/02/16 0428  10/02/16 1600  10/03/16 1705  10/03/16 1851 10/03/16 2017 10/03/16 2022  NA 139  136  < > 137  < > 137  < > 139 142 139  K 3.8  3.9  < > 5.0  < > 4.2  < > 3.9 3.7 4.0  CL 96*  99*  < > 97*  < > 94*  < > 93* 91* 91*  CO2 25  --  29  --  29  --   --   --   --  GLUCOSE 135*  133*  < > 164*  < > 163*  < > 154* 207* 143*  BUN 23*  21*  < > 18  < > 26*  < > 27* 22* 28*  CREATININE 2.00*  1.98*  < > 2.03*  < > 2.01*  < > 2.00* 1.50* 2.00*  CALCIUM 8.9  --  9.3  --  11.3*  --   --   --   --   PHOS 4.5  --  4.7*  --  5.0*  --   --   --   --   < > = values in this interval not displayed.  Recent Labs Lab 10/02/16 0428  10/03/16 0400 10/03/16 1705 10/04/16 0419  AST 48*  --  51*  --  58*  ALT 24  --  25  --  32  ALKPHOS 125  --  125  --  135*  BILITOT 10.0*  --  9.9*  --  9.1*  PROT 7.1  --  7.6  --  6.6  ALBUMIN 1.7*  < > 1.7* 1.7* 1.5*  < > = values in this interval not displayed.  Recent Labs Lab 10/01/16 0455  10/02/16 0730  10/03/16 2017 10/03/16 2022 10/04/16 0419  WBC 14.6*  --  14.5*  --   --   --  14.8*  HGB 14.4  < > 14.2  < > 16.0 14.3 13.0  HCT 41.1  < > 40.6  < > 47.0 42.0 37.2*  MCV 77.7*  --  77.9*  --   --   --  76.9*  PLT 152  --  129*  --   --   --  176  < > = values in this interval not displayed. Iron/TIBC/Ferritin/ %Sat    Component Value  Date/Time   IRON 32 (L) 08/12/2013 1136   IRON 28 (L) 08/12/2013 1136   TIBC 272 08/12/2013 1136   FERRITIN 291.5 08/12/2013 1136   IRONPCTSAT 12 (L) 08/12/2013 1136

## 2016-10-05 ENCOUNTER — Inpatient Hospital Stay (HOSPITAL_COMMUNITY): Payer: Medicare Other

## 2016-10-05 DIAGNOSIS — J9600 Acute respiratory failure, unspecified whether with hypoxia or hypercapnia: Secondary | ICD-10-CM

## 2016-10-05 LAB — POCT I-STAT, CHEM 8
BUN: 29 mg/dL — ABNORMAL HIGH (ref 6–20)
BUN: 31 mg/dL — ABNORMAL HIGH (ref 6–20)
BUN: 34 mg/dL — ABNORMAL HIGH (ref 6–20)
BUN: 35 mg/dL — ABNORMAL HIGH (ref 6–20)
BUN: 36 mg/dL — ABNORMAL HIGH (ref 6–20)
BUN: 37 mg/dL — AB (ref 6–20)
CALCIUM ION: 0.38 mmol/L — AB (ref 1.15–1.40)
CALCIUM ION: 1.17 mmol/L (ref 1.15–1.40)
CALCIUM ION: 1.26 mmol/L (ref 1.15–1.40)
CHLORIDE: 89 mmol/L — AB (ref 101–111)
CHLORIDE: 89 mmol/L — AB (ref 101–111)
CHLORIDE: 91 mmol/L — AB (ref 101–111)
CHLORIDE: 91 mmol/L — AB (ref 101–111)
CREATININE: 1.3 mg/dL — AB (ref 0.61–1.24)
CREATININE: 1.5 mg/dL — AB (ref 0.61–1.24)
CREATININE: 1.8 mg/dL — AB (ref 0.61–1.24)
CREATININE: 2.1 mg/dL — AB (ref 0.61–1.24)
Calcium, Ion: 0.4 mmol/L — CL (ref 1.15–1.40)
Calcium, Ion: 1.2 mmol/L (ref 1.15–1.40)
Calcium, Ion: 0.36 mmol/L — CL (ref 1.15–1.40)
Chloride: 91 mmol/L — ABNORMAL LOW (ref 101–111)
Chloride: 90 mmol/L — ABNORMAL LOW (ref 101–111)
Creatinine, Ser: 1.4 mg/dL — ABNORMAL HIGH (ref 0.61–1.24)
Creatinine, Ser: 1.9 mg/dL — ABNORMAL HIGH (ref 0.61–1.24)
GLUCOSE: 170 mg/dL — AB (ref 65–99)
GLUCOSE: 175 mg/dL — AB (ref 65–99)
GLUCOSE: 232 mg/dL — AB (ref 65–99)
GLUCOSE: 238 mg/dL — AB (ref 65–99)
GLUCOSE: 242 mg/dL — AB (ref 65–99)
Glucose, Bld: 198 mg/dL — ABNORMAL HIGH (ref 65–99)
HCT: 46 % (ref 39.0–52.0)
HCT: 46 % (ref 39.0–52.0)
HCT: 47 % (ref 39.0–52.0)
HEMATOCRIT: 43 % (ref 39.0–52.0)
HEMATOCRIT: 48 % (ref 39.0–52.0)
HEMATOCRIT: 41 % (ref 39.0–52.0)
HEMOGLOBIN: 16.3 g/dL (ref 13.0–17.0)
HEMOGLOBIN: 13.9 g/dL (ref 13.0–17.0)
HEMOGLOBIN: 15.6 g/dL (ref 13.0–17.0)
Hemoglobin: 14.6 g/dL (ref 13.0–17.0)
Hemoglobin: 15.6 g/dL (ref 13.0–17.0)
Hemoglobin: 16 g/dL (ref 13.0–17.0)
POTASSIUM: 3.7 mmol/L (ref 3.5–5.1)
POTASSIUM: 3.7 mmol/L (ref 3.5–5.1)
Potassium: 3.4 mmol/L — ABNORMAL LOW (ref 3.5–5.1)
Potassium: 3.5 mmol/L (ref 3.5–5.1)
Potassium: 3.9 mmol/L (ref 3.5–5.1)
Potassium: 4 mmol/L (ref 3.5–5.1)
SODIUM: 138 mmol/L (ref 135–145)
SODIUM: 140 mmol/L (ref 135–145)
SODIUM: 140 mmol/L (ref 135–145)
SODIUM: 142 mmol/L (ref 135–145)
Sodium: 139 mmol/L (ref 135–145)
Sodium: 140 mmol/L (ref 135–145)
TCO2: 31 mmol/L (ref 0–100)
TCO2: 31 mmol/L (ref 0–100)
TCO2: 33 mmol/L (ref 0–100)
TCO2: 33 mmol/L (ref 0–100)
TCO2: 34 mmol/L (ref 0–100)
TCO2: 36 mmol/L (ref 0–100)

## 2016-10-05 LAB — HEPATIC FUNCTION PANEL
ALT: 43 U/L (ref 17–63)
AST: 70 U/L — ABNORMAL HIGH (ref 15–41)
Albumin: 1.6 g/dL — ABNORMAL LOW (ref 3.5–5.0)
Alkaline Phosphatase: 158 U/L — ABNORMAL HIGH (ref 38–126)
Bilirubin, Direct: 7 mg/dL — ABNORMAL HIGH (ref 0.1–0.5)
Indirect Bilirubin: 3.9 mg/dL — ABNORMAL HIGH (ref 0.3–0.9)
TOTAL PROTEIN: 7.1 g/dL (ref 6.5–8.1)
Total Bilirubin: 10.9 mg/dL — ABNORMAL HIGH (ref 0.3–1.2)

## 2016-10-05 LAB — GLUCOSE, CAPILLARY
Glucose-Capillary: 160 mg/dL — ABNORMAL HIGH (ref 65–99)
Glucose-Capillary: 164 mg/dL — ABNORMAL HIGH (ref 65–99)
Glucose-Capillary: 165 mg/dL — ABNORMAL HIGH (ref 65–99)
Glucose-Capillary: 168 mg/dL — ABNORMAL HIGH (ref 65–99)

## 2016-10-05 LAB — BASIC METABOLIC PANEL
Anion gap: 14 (ref 5–15)
BUN: 35 mg/dL — ABNORMAL HIGH (ref 6–20)
CHLORIDE: 89 mmol/L — AB (ref 101–111)
CO2: 33 mmol/L — ABNORMAL HIGH (ref 22–32)
CREATININE: 1.99 mg/dL — AB (ref 0.61–1.24)
Calcium: 11.4 mg/dL — ABNORMAL HIGH (ref 8.9–10.3)
GFR, EST AFRICAN AMERICAN: 39 mL/min — AB (ref >60)
GFR, EST NON AFRICAN AMERICAN: 34 mL/min — AB (ref >60)
Glucose, Bld: 187 mg/dL — ABNORMAL HIGH (ref 65–99)
POTASSIUM: 3.7 mmol/L (ref 3.5–5.1)
SODIUM: 136 mmol/L (ref 135–145)

## 2016-10-05 LAB — MAGNESIUM: MAGNESIUM: 2.4 mg/dL (ref 1.7–2.4)

## 2016-10-05 LAB — CBC
HCT: 38.9 % — ABNORMAL LOW (ref 39.0–52.0)
HEMOGLOBIN: 13.7 g/dL (ref 13.0–17.0)
MCH: 27.2 pg (ref 26.0–34.0)
MCHC: 35.2 g/dL (ref 30.0–36.0)
MCV: 77.3 fL — ABNORMAL LOW (ref 78.0–100.0)
PLATELETS: 167 10*3/uL (ref 150–400)
RBC: 5.03 MIL/uL (ref 4.22–5.81)
RDW: 21.4 % — ABNORMAL HIGH (ref 11.5–15.5)
WBC: 14.9 10*3/uL — ABNORMAL HIGH (ref 4.0–10.5)

## 2016-10-05 LAB — PROTIME-INR
INR: 1.24
PROTHROMBIN TIME: 15.7 s — AB (ref 11.4–15.2)

## 2016-10-05 LAB — CALCIUM, IONIZED
Calcium, Ionized, Serum: 4.7 mg/dL (ref 4.5–5.6)
Calcium, Ionized, Serum: 5.3 mg/dL (ref 4.5–5.6)

## 2016-10-05 LAB — APTT: APTT: 38 s — AB (ref 24–36)

## 2016-10-05 MED ORDER — MORPHINE BOLUS VIA INFUSION
5.0000 mg | INTRAVENOUS | Status: DC | PRN
  Filled 2016-10-05: qty 20

## 2016-10-05 MED ORDER — SODIUM CHLORIDE 0.9 % IV SOLN
10.0000 mg/h | INTRAVENOUS | Status: DC
  Administered 2016-10-05: 10 mg/h via INTRAVENOUS
  Filled 2016-10-05 (×2): qty 10

## 2016-10-05 MED ORDER — SODIUM CHLORIDE 0.9 % IV SOLN
1.0000 mg/h | INTRAVENOUS | Status: DC
  Administered 2016-10-05: 4 mg/h via INTRAVENOUS
  Filled 2016-10-05: qty 10

## 2016-10-05 MED ORDER — MIDAZOLAM BOLUS VIA INFUSION (WITHDRAWAL LIFE SUSTAINING TX)
5.0000 mg | INTRAVENOUS | Status: DC | PRN
  Filled 2016-10-05: qty 20

## 2016-10-06 LAB — CALCIUM, IONIZED: CALCIUM, IONIZED, SERUM: 5.3 mg/dL (ref 4.5–5.6)

## 2016-10-08 ENCOUNTER — Telehealth: Payer: Self-pay

## 2016-10-08 NOTE — Telephone Encounter (Signed)
On 10/08/2016 I received a death certificate from [employer]. The death certificate is for cremation. The patient is a patient of Doctor Maneem. The death certificate will be taken to Pulmonary Unit @ [facility] tomorrow am for signature.  On 10-23-16 I received the death certificate back from Doctor Maneem. I got the death certificate ready and called the funeral home to let them know the death certificate is ready for pickup.

## 2016-10-09 NOTE — Progress Notes (Signed)
Patient absent of heart sounds, breath pupils pin point. Family at bedside. Morphine drip wasted in sink (220 ML), versed drip wasted in sink (3ml) and fentenyl drip wasted in sink (100 ml). Philippa Sicks RN witness.

## 2016-10-09 NOTE — Progress Notes (Signed)
ETT Holder changed.  Tube repositioned on Right side of pts mouth.

## 2016-10-09 NOTE — Progress Notes (Signed)
PULMONARY / CRITICAL CARE MEDICINE   Name: Trevor [name] MRN: [id number] DOB: July 03, 1951    ADMISSION DATE:  09/11/2016 CONSULTATION DATE:  1/19  REFERRING MD:  Sloan Leiter  CHIEF COMPLAINT:   Progressive acute hypoxic respiratory failure, SIRS/sepsis AND worsening Encephalopathy   brief 66 year old male w/ sig h/o systolic CM (EF 40-35%), AF, (not on AC)  ESRD TTS, and cardiac related cirrhosis. Admitted 1/17 w/ 3 d h/o URI symptoms, temp as high as 103, worsening malaise and progressive dyspnea. The day of admit his wife found him to be disoriented. On arrival to the ER his room air sats were in 80s. His MS and sats improved w/ oxygen. He was admitted w/ working dx of HCAP. Placed on broad spec abx and admitted to the SDU. From 1/17 to 1/19 his confusion actually continued to worsen. His RVP PCR was positive for Coronavirus. He underwent HD X 2 (From time of admit to 1/19). When he returned to the SDU on 1/19 after HD (net -~2.5 L) he was found to be more confused, post HS CXR w/ progression of bilateral R>L infiltrates and O2 requirements up to 5 liters w/ RR in 30s to 40s. PCCM was asked to see given concern that his clinical course had been deteriorating.   STUDIES:  09/14/2016 - admit ECHO 1/19>>>ef 45% RVP PCR 1/18: Coronavirus HKUI BCX2 1/17>>>No growth 5 days. BAL 1/19 >>> multiple organisms, none predominant    SUBJECTIVE/OVERNIGHT/INTERVAL HX No major changes in clinical situation. Still on 40%, PEEP 8 and CRRT New pneumothorax on the left. Dried blood around the mouth and bloody secretions and subglottic tube. Worsening bilirubin.  VITAL SIGNS: BP 120/73   Pulse 85   Temp 97.6 F (36.4 C) (Oral)   Resp (!) 29   Ht _0  (1.854 m)   Wt 194 lb 10.7 oz (88.3 kg)   SpO2 95%   BMI 25.68 kg/m  5 liters   HEMODYNAMICS:   VENTILATOR SETTINGS: Vent Mode: PRVC FiO2 (%):  [40 %] 40 % Set Rate:  [28 bmp] 28 bmp Vt Set:  [480 mL] 480 mL PEEP:  [8 cmH20] 8  cmH20 Plateau Pressure:  [17 cmH20-26 cmH20] 19 cmH20  INTAKE / OUTPUT:  Intake/Output Summary (Last 24 hours) at 10/09/2016 0737 Last data filed at 10-09-16 0700  Gross per 24 hour  Intake          4527.79 ml  Output             2745 ml  Net          1782.79 ml   ON examination: Gen:      No acute distress HEENT:  EOMI, sclera anicteric Neck:     No masses; no thyromegaly Lungs:    Clear to auscultation bilaterally; normal respiratory effort CV:         Irregular rate and rhythm, no murmurs Abd:      + bowel sounds; soft, non-tender; no palpable masses, no distension Ext:    No edema; adequate peripheral perfusion Skin:      Warm and dry; no rash Neuro: DP Sedated, RASS -4  LABS: PULMONARY  Recent Labs Lab 09/30/16 0315  10/04/16 2042 10/04/16 2208 10/04/16 2215 10/04/16 2355 2016/10/09 0005  PHART 7.356  --   --   --   --   --   --   PCO2ART 43.7  --   --   --   --   --   --  PO2ART 93.6  --   --   --   --   --   --   HCO3 24.1  --   --   --   --   --   --   TCO2  --   < > 36 31 39 31 36  O2SAT 96.6  --   --   --   --   --   --   < > = values in this interval not displayed.  CBC  Recent Labs Lab 10/02/16 0730  10/04/16 0419  10/04/16 2355 10-31-2016 0005 October 31, 2016 0407  HGB 14.2  < > 13.0  < > 16.0 15.6 13.7  HCT 40.6  < > 37.2*  < > 47.0 46.0 38.9*  WBC 14.5*  --  14.8*  --   --   --  14.9*  PLT 129*  --  176  --   --   --  167  < > = values in this interval not displayed.  COAGULATION  Recent Labs Lab 10/02/16 0428 10/03/16 0400 10/04/16 0419 10/31/2016 0407  INR 1.22 1.17 1.19 1.24    CARDIAC    Recent Labs Lab 09/28/16 1359 09/30/16 0413  TROPONINI 0.14* 0.12*   No results for input(s): PROBNP in the last 168 hours.   CHEMISTRY  Recent Labs Lab 10/01/16 0455  10/01/16 1518  10/02/16 0428  10/02/16 1600  10/03/16 0400  10/03/16 1705  10/04/16 0419  10/04/16 1600  10/04/16 2208 10/04/16 2215 10/04/16 2355 2016-10-31 0005  2016-10-31 0407  NA 137  < > 135  < > 139  136  < > 137  < >  --   < > 137  < >  --   < > 138  < > 141 138 142 139 136  K 3.9  < > 4.0  < > 3.8  3.9  < > 5.0  < >  --   < > 4.2  < >  --   < > 4.2  < > 3.7 4.5 3.7 3.9 3.7  CL 101  < > 100*  < > 96*  99*  < > 97*  < >  --   < > 94*  < >  --   < > 92*  < > 91* 92* 91* 89* 89*  CO2 25  --  24  --  25  --  29  --   --   --  29  --   --   --  30  --   --   --   --   --  33*  GLUCOSE 143*  < > 146*  < > 135*  133*  < > 164*  < >  --   < > 163*  < >  --   < > 114*  < > 208* 151* 238* 175* 187*  BUN 27*  < > 24*  < > 23*  21*  < > 18  < >  --   < > 26*  < >  --   < > 33*  < > 30* 43* 29* 37* 35*  CREATININE 2.50*  < > 2.25*  < > 2.00*  1.98*  < > 2.03*  < >  --   < > 2.01*  < >  --   < > 2.24*  < > 1.50* 2.10* 1.50* 2.10* 1.99*  CALCIUM 8.5*  --  8.6*  --  8.9  --  9.3  --   --   --  11.3*  --   --   --  11.5*  --   --   --   --   --  11.4*  MG 2.2  --   --   --  2.4  --   --   --  2.3  --   --   --  2.3  --   --   --   --   --   --   --  2.4  PHOS 3.9  --  4.3  --  4.5  --  4.7*  --   --   --  5.0*  --   --   --  4.8*  --   --   --   --   --   --   < > = values in this interval not displayed. Estimated Creatinine Clearance: 42.9 mL/min (by C-G formula based on SCr of 1.99 mg/dL (H)).   LIVER  Recent Labs Lab 10/01/16 0455  10/02/16 0428  10/03/16 0400 10/03/16 1705 10/04/16 0419 10/04/16 1600 2016/10/15 0407  AST 42*  --  48*  --  51*  --  58*  --  70*  ALT 21  --  24  --  25  --  32  --  43  ALKPHOS 102  --  125  --  125  --  135*  --  158*  BILITOT 8.8*  --  10.0*  --  9.9*  --  9.1*  --  10.9*  PROT 7.0  --  7.1  --  7.6  --  6.6  --  7.1  ALBUMIN 1.7*  1.7*  < > 1.7*  < > 1.7* 1.7* 1.5* 1.7* 1.6*  INR  --   --  1.22  --  1.17  --  1.19  --  1.24  < > = values in this interval not displayed.   INFECTIOUS No results for input(s): LATICACIDVEN, PROCALCITON in the last 168 hours.   ENDOCRINE CBG (last 3)   Recent Labs   10/04/16 2005 Oct 15, 2016 0013 2016/10/15 0422  GLUCAP 150* 165* 168*    IMAGING x48h  - image(s) personally visualized  -   highlighted in bold Dg Chest Port 1 View  Result Date: 10-15-2016 CLINICAL DATA:  Acute respiratory failure EXAM: PORTABLE CHEST 1 VIEW COMPARISON:  10/01/2016 FINDINGS: Endotracheal tube in good position. NG tube enters the stomach. Left jugular central venous catheter tip in the left innominate vein. Right jugular central venous catheter tip in the proximal SVC. Left-sided pneumothorax in the apex and lateral base is new since the prior study. Approximately 15%. Extensive airspace disease in the right lung base again noted with mild interval improvement. Mild moderate right effusion unchanged. Left lower lobe atelectasis/infiltrate unchanged. No significant left effusion. IMPRESSION: Support lines unchanged in position New pneumothorax on the left, approximately 15% Bilateral airspace disease right greater than left. Mild improvement since prior study. These results were called by telephone at the time of interpretation on 15-Oct-2016 at 7:26 am to [facility], who verbally acknowledged these results. Electronically Signed   By: Franchot Gallo M.D.   On: 10-15-2016 07:26    ANTIBIOTICS: Vanc 1/17>>1/21 Zosyn 1/17>>>1/25 Levaquin 1/19>>> 1/21  SIGNIFICANT EVENTS:  LINES/TUBES: ETT 1/19>> RIJ CVL 1/19>> Left HD cath 1/21>> A line 1/22>>  DISCUSSION: 39 yom w/ advanced systolic CM (EF 96-29%), ESRD, and AF. Admitted 1/17 w/ viral PNA +/- HCAP.  Now w/ progressive hypoxia, pulmonary infiltrates and respiratory failure. ARDS secondary to coronavirus infection.   His condition remained the same, requiring 15 of levo. His levo requirement slightly decreased after adding stress steroids. Currently deeply sedated. Do not follow commands or open eyes. . Bilirubin remained stable at 9.9 today.  ASSESSMENT / PLAN:  PULMONARY A: Acute hypoxic Respiratory failure in the setting  of bilateral pulmonary infiltrates- now stable Presume viral Pneumonitis +/- HCAP  W/ progressive ARDS New pneumothorax on the left.  P:   Continue ARDS protocol If no improvement soon he might be need a trach Finished antibiotics.  CARDIOVASCULAR A:  Af w/ RVR SIRS/sepsis  Troponin elevation- Now starting to VKPQAES(9.75>>3.00) Chronic systolic HF w/ EF 51-10% Hypotension-Still on Levo. 10  P:  Continue rate control with digoxin Wean Levophed as tolerated  RENAL A:   ESRD-TTS Renal functions- improving  P:   CRRT as per renal  GASTROINTESTINAL A:   Hepatic congestion/ w/ hepatonephromegaly w/ presumed cardiac related Cirrhosis (has had prior paracentesis on 1/12) Ascites s/p para on 1/12- no SBP Hyperbilirubinemia- worsening at 10.9 today,  PT/INR within normal limits and APTT mildly elevated. RUQ [place] is negative for any cholecystitis or obstruction. Nutrition P:   Follow LFTs Continue tube feeds Pepcid for ulcer prophylaxis  HEMATOLOGIC A:   Thrombocytopenia- at baseline Leukocytosis- persistent  Remote DVT P:  Trend CBC Transfuse per protocol   INFECTIOUS A:   Viral PNA + Coronavirus HKU1 HCAP Sirs/sepsis  P:   Completed all antibiotics ID signed off. Continue supportive care  ENDOCRINE A:   Hypoglycemia  P:   Continue CBGs  NEUROLOGIC A:   Acute encephalopathy in setting of sepsis and cirrhosis RASS -4 P:   RASS goal: -1 to -2 PAD protocol   FAMILY  - Updates: No family at bed side. Family updated 1/26 - Inter-disciplinary family meet or Palliative Care meeting due by:  1/26 -Might involve palliative care at this point.  Lorella Nimrod, MD Internal Medicine, PGY1 Pager- [phone] If no answer or after 3pm call: [phone] 10-30-2016, 7:37 AM

## 2016-10-09 NOTE — Discharge Summary (Signed)
  Name: Trevor [name] MRN: [id number] DOB: May 01, 1951 66 y.o.  Date of Admission: 2016-10-13 10:33 AM Date of Discharge: 10/07/2016 Attending Physician: No att. providers found  Discharge Diagnosis: Active Problems:   Disorder of phosphorus metabolism   Iron deficiency anemia   Essential hypertension   GERD (gastroesophageal reflux disease)   DVT of lower extremity (deep venous thrombosis) RLE   Pulmonary embolism (HCC)   DCM- EF 25-30% by echo 09/04/14   Ascites   Hepatic cirrhosis (HCC)   Hepatosplenomegaly   Chronic combined systolic and diastolic heart failure (HCC)   End-stage renal disease on hemodialysis (HCC)   Elevated troponin   Sepsis (HCC)   Acute respiratory failure with hypoxia (HCC)   Altered mental status   Atrial fibrillation with RVR (HCC)   Septic shock (HCC)   ARDS (adult respiratory distress syndrome) (HCC)   Cause of death: Pneumonia Time of death: 5:30 PM  Disposition and follow-up:   [name] was discharged from [facility] in expired condition.    Hospital Course: 66 year old male w/ sig h/o systolic CM (EF 30-35%), AF, (not on AC)  ESRD TTS, and cardiac related cirrhosis. Admitted 1/17 w/ 3 d h/o URI symptoms, temp as high as 103, worsening malaise and progressive dyspnea. He developed acute respiratory failure secondary to coronavirus infection, pneumonia, ARDS. He developed multiorgan failure later requiring multiple pressor support, CRRT and antibiotics. He continued to deteriorate despite full support. Later on 09/29/2016 his son who himself is a physician visited him from [place] after going through his records and talking with Dr. Isaiah Serge, Decision was made by family to withdraw care and transition to full comfort care. He was extubated and at 2:48 PM, later passed comfortably around 5:30 PM with all his family at bedside.  Signed: Arnetha Courser, MD 10/07/2016, 2:16 PM

## 2016-10-09 NOTE — Procedures (Signed)
Extubation Procedure Note  Patient Details:   Name: Trevor [name] DOB: 08/27/1951 MRN: [id number]   Airway Documentation:  Airway 8 mm (Active)  Secured at (cm) 23 cm 10/06/16 12:00 PM  Measured From Lips Oct 06, 2016 12:00 PM  Secured Location Left 2016-10-06 12:00 PM  Secured By [employer] 10/06/16 12:00 PM  Tube Holder Repositioned Yes Oct 06, 2016 12:00 PM  Cuff Pressure (cm H2O) 28 cm H2O 06-Oct-2016  5:15 AM  Site Condition Dry 2016-10-06  8:45 AM   Pt extubated to room air.  Evaluation  O2 sats: currently acceptable Complications: No apparent complications Patient did tolerate procedure well. Bilateral Breath Sounds: Clear, Diminished   No  Dewight Catino Tobi Bastos 06-Oct-2016, 2:48 PM

## 2016-10-09 NOTE — Progress Notes (Signed)
Pt temp 95.9 F notified RN

## 2016-10-09 DEATH — deceased

## 2016-10-14 ENCOUNTER — Telehealth: Payer: Self-pay

## 2016-10-14 NOTE — Telephone Encounter (Signed)
On 10/14/16 I received a death certificate from [employer] (replacement). The death certificate is for cremation. The patient is a patient of Doctor Maneem. The death certificate will be taken to [employer] this pm for signature.  On 10-19-16 I received the death certificate back from Doctor Maneem. I got the death certificate ready and called the funeral home to let them know I mailed the death certificate to the funeral home per the funeral home request.
